# Patient Record
Sex: Male | Born: 1945 | Race: White | Hispanic: No | Marital: Married | Smoking: Never smoker
Health system: Southern US, Community
[De-identification: ages and names within clinical notes are randomized; demographics above are authoritative.]

## PROBLEM LIST (undated history)

## (undated) DIAGNOSIS — G20A1 Parkinson's disease without dyskinesia, without mention of fluctuations: Secondary | ICD-10-CM

## (undated) DIAGNOSIS — G20 Parkinson's disease: Secondary | ICD-10-CM

## (undated) DIAGNOSIS — E119 Type 2 diabetes mellitus without complications: Secondary | ICD-10-CM

## (undated) DIAGNOSIS — N200 Calculus of kidney: Secondary | ICD-10-CM

## (undated) DIAGNOSIS — K219 Gastro-esophageal reflux disease without esophagitis: Secondary | ICD-10-CM

## (undated) DIAGNOSIS — M1990 Unspecified osteoarthritis, unspecified site: Secondary | ICD-10-CM

## (undated) DIAGNOSIS — N400 Enlarged prostate without lower urinary tract symptoms: Secondary | ICD-10-CM

## (undated) DIAGNOSIS — I10 Essential (primary) hypertension: Secondary | ICD-10-CM

## (undated) DIAGNOSIS — E7800 Pure hypercholesterolemia, unspecified: Secondary | ICD-10-CM

## (undated) DIAGNOSIS — N529 Male erectile dysfunction, unspecified: Secondary | ICD-10-CM

## (undated) DIAGNOSIS — R55 Syncope and collapse: Secondary | ICD-10-CM

## (undated) DIAGNOSIS — J309 Allergic rhinitis, unspecified: Secondary | ICD-10-CM

## (undated) DIAGNOSIS — H9110 Presbycusis, unspecified ear: Secondary | ICD-10-CM

## (undated) DIAGNOSIS — B351 Tinea unguium: Secondary | ICD-10-CM

## (undated) DIAGNOSIS — R609 Edema, unspecified: Secondary | ICD-10-CM

## (undated) DIAGNOSIS — R1313 Dysphagia, pharyngeal phase: Secondary | ICD-10-CM

## (undated) HISTORY — PX: TOTAL KNEE ARTHROPLASTY: SHX125

## (undated) HISTORY — PX: JOINT REPLACEMENT: SHX530

## (undated) HISTORY — DX: Type 2 diabetes mellitus without complications: E11.9

## (undated) HISTORY — PX: KNEE SURGERY: SHX244

## (undated) HISTORY — DX: Pure hypercholesterolemia, unspecified: E78.00

## (undated) HISTORY — DX: Essential (primary) hypertension: I10

## (undated) HISTORY — DX: Calculus of kidney: N20.0

## (undated) HISTORY — DX: Parkinson's disease without dyskinesia, without mention of fluctuations: G20.A1

## (undated) HISTORY — DX: Allergic rhinitis, unspecified: J30.9

## (undated) HISTORY — DX: Tinea unguium: B35.1

## (undated) HISTORY — DX: Benign prostatic hyperplasia without lower urinary tract symptoms: N40.0

## (undated) HISTORY — DX: Presbycusis, unspecified ear: H91.10

## (undated) HISTORY — DX: Edema, unspecified: R60.9

## (undated) HISTORY — DX: Parkinson's disease: G20

## (undated) HISTORY — DX: Syncope and collapse: R55

## (undated) HISTORY — PX: HAND SURGERY: SHX662

## (undated) HISTORY — DX: Dysphagia, pharyngeal phase: R13.13

## (undated) HISTORY — DX: Male erectile dysfunction, unspecified: N52.9

---

## 2005-07-25 ENCOUNTER — Emergency Department (HOSPITAL_COMMUNITY): Admission: EM | Admit: 2005-07-25 | Discharge: 2005-07-25 | Payer: Self-pay | Admitting: Emergency Medicine

## 2005-08-03 ENCOUNTER — Ambulatory Visit: Payer: Self-pay | Admitting: Internal Medicine

## 2005-08-14 ENCOUNTER — Ambulatory Visit: Payer: Self-pay | Admitting: Internal Medicine

## 2005-10-10 ENCOUNTER — Inpatient Hospital Stay (HOSPITAL_COMMUNITY): Admission: RE | Admit: 2005-10-10 | Discharge: 2005-10-12 | Payer: Self-pay | Admitting: Orthopedic Surgery

## 2005-11-26 ENCOUNTER — Ambulatory Visit (HOSPITAL_BASED_OUTPATIENT_CLINIC_OR_DEPARTMENT_OTHER): Admission: RE | Admit: 2005-11-26 | Discharge: 2005-11-26 | Payer: Self-pay | Admitting: Orthopedic Surgery

## 2006-10-03 ENCOUNTER — Ambulatory Visit (HOSPITAL_COMMUNITY): Admission: RE | Admit: 2006-10-03 | Discharge: 2006-10-03 | Payer: Self-pay | Admitting: Urology

## 2007-08-30 ENCOUNTER — Emergency Department (HOSPITAL_COMMUNITY): Admission: EM | Admit: 2007-08-30 | Discharge: 2007-08-30 | Payer: Self-pay | Admitting: Emergency Medicine

## 2008-07-26 ENCOUNTER — Encounter: Payer: Self-pay | Admitting: Internal Medicine

## 2009-10-03 ENCOUNTER — Encounter: Admission: RE | Admit: 2009-10-03 | Discharge: 2009-10-03 | Payer: Self-pay | Admitting: Psychiatry

## 2009-10-31 ENCOUNTER — Inpatient Hospital Stay (HOSPITAL_COMMUNITY): Admission: RE | Admit: 2009-10-31 | Discharge: 2009-11-02 | Payer: Self-pay | Admitting: Orthopedic Surgery

## 2010-03-14 ENCOUNTER — Ambulatory Visit: Payer: Self-pay

## 2010-03-14 ENCOUNTER — Encounter: Payer: Self-pay | Admitting: Cardiovascular Disease

## 2010-03-14 DIAGNOSIS — M79609 Pain in unspecified limb: Secondary | ICD-10-CM

## 2010-07-31 ENCOUNTER — Encounter (INDEPENDENT_AMBULATORY_CARE_PROVIDER_SITE_OTHER): Payer: Self-pay | Admitting: *Deleted

## 2010-09-04 ENCOUNTER — Encounter (INDEPENDENT_AMBULATORY_CARE_PROVIDER_SITE_OTHER): Payer: Self-pay | Admitting: *Deleted

## 2010-09-05 ENCOUNTER — Ambulatory Visit: Payer: Self-pay | Admitting: Internal Medicine

## 2010-09-18 ENCOUNTER — Telehealth: Payer: Self-pay | Admitting: Internal Medicine

## 2010-09-19 ENCOUNTER — Ambulatory Visit: Payer: Self-pay | Admitting: Internal Medicine

## 2010-11-14 NOTE — Procedures (Signed)
Summary: Colonoscopy  Patient: [name] Note: All result statuses are Final unless otherwise noted.  Tests: (1) Colonoscopy (COL)   COL Colonoscopy           DONE     [facility]     [address]     [place], [place]  [address]           COLONOSCOPY PROCEDURE REPORT           PATIENT:  Eric, Le  MR#:  [id number]     BIRTHDATE:  14-Oct-1946, 64 yrs. old  GENDER:  male     ENDOSCOPIST:  Iva Boop, MD, [facility]           PROCEDURE DATE:  09/19/2010     PROCEDURE:  Colonoscopy 295[address]     ASA CLASS:  Class II     INDICATIONS:  surveillance and high-risk screening, history of     polyps 5 mm polyp removed but not recovered 2006     MEDICATIONS:   Fentanyl 75 mcg, Versed 8 mg IV           DESCRIPTION OF PROCEDURE:   After the risks benefits and     alternatives of the procedure were thoroughly explained, informed     consent was obtained.  Digital rectal exam was performed and     revealed no rectal masses and an enlarged prostate.  Mildly     enlarged prostate without nodules or asymmetry. The LB 180AL     [id number] endoscope was introduced through the anus and advanced to     the cecum, which was identified by both the appendix and ileocecal     valve, without limitations.  The quality of the prep was     excellent, using MoviPrep.  The instrument was then slowly     withdrawn as the colon was fully examined. Insertion: 4:50 minutes     Withdrawal: 10:09 minutes     <<PROCEDUREIMAGES>>           FINDINGS:  A normal appearing cecum, ileocecal valve, and     appendiceal orifice were identified. The ascending, hepatic     flexure, transverse, splenic flexure, descending, sigmoid colon,     and rectum appeared unremarkable.   Retroflexed views in the     rectum revealed no abnormalities.    The scope was then withdrawn     from the patient and the procedure completed.           COMPLICATIONS:  None     ENDOSCOPIC IMPRESSION:     1) Normal colonoscopy, excellent prep    RECOMMENDATIONS:     Keep follow-up with Dr. Kevan Ny re: mildly enlarged prostate.           REPEAT EXAM:  In 10 year(s) for routine screening colonoscopy.           Iva Boop, MD, [facility]           CC:  The Patient     R. Robley Fries, MD           n.     Rosalie Doctor:   Iva Boop at 09/19/2010 12:57 PM           Onnie Boer, [id number]  Note: An exclamation mark (!) indicates a result that was not dispersed into the flowsheet. Document Creation Date: 09/19/2010 12:57 PM _______________________________________________________________________  (1) Order result status: Final Collection or observation date-time: 09/19/2010 12:48 Requested date-time:  Receipt  date-time:  Reported date-time:  Referring Physician:   Ordering Physician: Stan Head [phone]) Specimen Source:  Source: Launa Grill Order Number: [id number] Lab site:   Appended Document: Colonoscopy    Clinical Lists Changes  Observations: Added new observation of COLONNXTDUE: 09/2020 (09/19/2010 14:13)

## 2010-11-14 NOTE — Letter (Signed)
Summary: Colonoscopy Letter  [facility]  [address] [place], [place] [address]   Phone: [phone]  Fax: [phone]      July 31, 2010 MRN: [id number]   [name] [address] [place], [place]  [address]   Dear [name],   According to your medical record, it is time for you to schedule a Colonoscopy. The American Cancer Society recommends this procedure as a method to detect early colon cancer. Patients with a family history of colon cancer, or a personal history of colon polyps or inflammatory bowel disease are at increased risk.  This letter has been generated based on the recommendations made at the time of your procedure. If you feel that in your particular situation this may no longer apply, please contact our office.  Please call our office at [phone] to schedule this appointment or to update your records at your earliest convenience.  Thank you for cooperating with [place] to provide you with the very best care possible.   Sincerely,   Iva Boop, M.D.  [facility] [phone]

## 2010-11-14 NOTE — Letter (Signed)
Summary: [facility] Instructions  [facility]  [address] [place], [place] [address]   Phone: [phone]  Fax: [phone]       [name]    04-27-1946    MRN: [id number]        Procedure Day /Date:  Tuesday 09/19/2010     Arrival Time:  10:30 am      Procedure Time:  11:30 am     Location of Procedure:                    _ x_  [facility] (4th Floor)                        PREPARATION FOR COLONOSCOPY WITH MOVIPREP   Starting 5 days prior to your procedure Thursday 12/1 do not eat nuts, seeds, popcorn, corn, beans, peas,  salads, or any raw vegetables.  Do not take any fiber supplements (e.g. Metamucil, Citrucel, and Benefiber).  THE DAY BEFORE YOUR PROCEDURE         DATE: Monday 12/5  1.  Drink clear liquids the entire day-NO SOLID FOOD  2.  Do not drink anything colored red or purple.  Avoid juices with pulp.  No orange juice.  3.  Drink at least 64 oz. (8 glasses) of fluid/clear liquids during the day to prevent dehydration and help the prep work efficiently.  CLEAR LIQUIDS INCLUDE: Water Jello Ice Popsicles Tea (sugar ok, no milk/cream) Powdered fruit flavored drinks Coffee (sugar ok, no milk/cream) Gatorade Juice: apple, white grape, white cranberry  Lemonade Clear bullion, consomm, broth Carbonated beverages (any kind) Strained chicken noodle soup Hard Candy                             4.  In the morning, mix first dose of MoviPrep solution:    Empty 1 Pouch A and 1 Pouch B into the disposable container    Add lukewarm drinking water to the top line of the container. Mix to dissolve    Refrigerate (mixed solution should be used within 24 hrs)  5.  Begin drinking the prep at 5:00 p.m. The MoviPrep container is divided by 4 marks.   Every 15 minutes drink the solution down to the next mark (approximately 8 oz) until the full liter is complete.   6.  Follow completed prep with 16 oz of clear liquid of your choice (Nothing  red or purple).  Continue to drink clear liquids until bedtime.  7.  Before going to bed, mix second dose of MoviPrep solution:    Empty 1 Pouch A and 1 Pouch B into the disposable container    Add lukewarm drinking water to the top line of the container. Mix to dissolve    Refrigerate  THE DAY OF YOUR PROCEDURE      DATE: Tuesday 12/6  Beginning at 6:30 a.m. (5 hours before procedure):         1. Every 15 minutes, drink the solution down to the next mark (approx 8 oz) until the full liter is complete.  2. Follow completed prep with 16 oz. of clear liquid of your choice.    3. You may drink clear liquids until 9:30 am (2 HOURS BEFORE PROCEDURE).   MEDICATION INSTRUCTIONS  Unless otherwise instructed, you should take regular prescription medications with a small sip of water   as early as possible the  morning of your procedure.    Additional medication instructions: Do not take Triam/HCTZ day of procedure.         OTHER INSTRUCTIONS  You will need a responsible adult at least 65 years of age to accompany you and drive you home.   This person must remain in the waiting room during your procedure.  Wear loose fitting clothing that is easily removed.  Leave jewelry and other valuables at home.  However, you may wish to bring a book to read or  an iPod/MP3 player to listen to music as you wait for your procedure to start.  Remove all body piercing jewelry and leave at home.  Total time from sign-in until discharge is approximately 2-3 hours.  You should go home directly after your procedure and rest.  You can resume normal activities the  day after your procedure.  The day of your procedure you should not:   Drive   Make legal decisions   Operate machinery   Drink alcohol   Return to work  You will receive specific instructions about eating, activities and medications before you leave.    The above instructions have been reviewed and explained to me by    Ezra Sites RN  September 05, 2010 1:58 PM     I fully understand and can verbalize these instructions _____________________________ Date _________

## 2010-11-14 NOTE — Miscellaneous (Signed)
Summary: Orders Update  Clinical Lists Changes  Problems: Added new problem of LEG PAIN (ICD-729.5) Orders: Added new Test order of Venous Duplex Lower Extremity (Venous Duplex Lower) - Signed 

## 2010-11-14 NOTE — Progress Notes (Signed)
Summary: Questions  Phone Note Call from Patient Call back at Home Phone [phone]   Caller: Patients wife Call For: Dr. Leone Payor Reason for Call: Talk to Nurse Summary of Call: Pts wife is calling because pt has colon tomorrow and had a knee replacement several years ago but not when he has procedures he needs to take amoxicillin does he need to do this for colon? Initial call taken by: [place] Johnson,  September 18, 2010 3:41 PM  Follow-up for Phone Call        Spoke with pt's wife who states pt has to take Amoxicillin prior to procedures due to knee replacements.  States I will double check with Dr. Leone Payor and will call wife back if pt needs to take.   Dr. Leone Payor, I just want to double check if you wanted pt to take ATB.  Thank you  Follow-up by: Karl Bales RN,  September 18, 2010 3:54 PM  Additional Follow-up for Phone Call Additional follow up Details #1::        No antibiotics indicated for joint replacements and colonoscopy - if they have a special circumstance I will consider but risks of antibiotics worse Additional Follow-up by: Iva Boop MD, Clementeen Graham,  September 19, 2010 7:59 AM    Additional Follow-up for Phone Call Additional follow up Details #2::    Writer spoke with wife re: above from Dr. Leone Payor Follow-up by: Karl Bales RN,  September 19, 2010 9:29 AM

## 2010-11-14 NOTE — Miscellaneous (Signed)
Summary: [facility]  Clinical Lists Changes  Medications: Added new medication of MOVIPREP 100 GM  SOLR (PEG-KCL-NACL-NASULF-NA ASC-C) As per prep instructions. - Signed Rx of MOVIPREP 100 GM  SOLR (PEG-KCL-NACL-NASULF-NA ASC-C) As per prep instructions.;  #1 x 0;  Signed;  Entered by: Ezra Sites RN;  Authorized by: Iva Boop MD, FACG;  Method used: Electronically to [facility]*, [address] [place], [place]  [address], Ph: [phone] or [phone], Fax: [phone] Observations: Added new observation of NKA: T (09/05/2010 13:28)    Prescriptions: MOVIPREP 100 GM  SOLR (PEG-KCL-NACL-NASULF-NA ASC-C) As per prep instructions.  #1 x 0   Entered by:   Ezra Sites RN   Authorized by:   Iva Boop MD, [facility]   Signed by:   Ezra Sites RN on 09/05/2010   Method used:   Electronically to        [facility]* (retail)       [address] [place], [place]  [address]       Ph: [phone] or [phone]       Fax: [phone]   RxID:   [id number]

## 2010-12-31 LAB — CBC
HCT: 48 % (ref 39.0–52.0)
HCT: 38 % — ABNORMAL LOW (ref 39.0–52.0)
Hemoglobin: 16.4 g/dL (ref 13.0–17.0)
Hemoglobin: 11.8 g/dL — ABNORMAL LOW (ref 13.0–17.0)
Hemoglobin: 13 g/dL (ref 13.0–17.0)
MCHC: 35 g/dL (ref 30.0–36.0)
MCV: 91.4 fL (ref 78.0–100.0)
Platelets: 245 10*3/uL (ref 150–400)
Platelets: 208 10*3/uL (ref 150–400)
RBC: 3.66 MIL/uL — ABNORMAL LOW (ref 4.22–5.81)
RDW: 12 % (ref 11.5–15.5)
WBC: 4.5 10*3/uL (ref 4.0–10.5)
WBC: 8.8 10*3/uL (ref 4.0–10.5)
WBC: 9.2 10*3/uL (ref 4.0–10.5)

## 2010-12-31 LAB — C-REACTIVE PROTEIN: CRP: 0 mg/dL — ABNORMAL LOW (ref <0.6)

## 2010-12-31 LAB — BASIC METABOLIC PANEL
CO2: 29 mEq/L (ref 19–32)
Calcium: 8.2 mg/dL — ABNORMAL LOW (ref 8.4–10.5)
Calcium: 8.4 mg/dL (ref 8.4–10.5)
Chloride: 101 mEq/L (ref 96–112)
Creatinine, Ser: 0.84 mg/dL (ref 0.4–1.5)
GFR calc Af Amer: >60 mL/min (ref >60)
GFR calc non Af Amer: >60 mL/min (ref >60)
Potassium: 4 mEq/L (ref 3.5–5.1)
Sodium: 135 mEq/L (ref 135–145)
Sodium: 137 mEq/L (ref 135–145)

## 2010-12-31 LAB — COMPREHENSIVE METABOLIC PANEL
Albumin: 4.1 g/dL (ref 3.5–5.2)
BUN: 15 mg/dL (ref 6–23)
Chloride: 102 mEq/L (ref 96–112)
Creatinine, Ser: 0.82 mg/dL (ref 0.4–1.5)
GFR calc non Af Amer: >60 mL/min (ref >60)
Glucose, Bld: 99 mg/dL (ref 70–99)
Total Bilirubin: 0.8 mg/dL (ref 0.3–1.2)

## 2010-12-31 LAB — DIFFERENTIAL
Basophils Absolute: 0 10*3/uL (ref 0.0–0.1)
Basophils Relative: 0 % (ref 0–1)
Eosinophils Absolute: 0.1 10*3/uL (ref 0.0–0.7)
Monocytes Relative: 7 % (ref 3–12)
Neutro Abs: 3 10*3/uL (ref 1.7–7.7)
Neutrophils Relative %: 66 % (ref 43–77)

## 2010-12-31 LAB — PROTIME-INR
INR: 1.2 (ref 0.00–1.49)
INR: 2.42 — ABNORMAL HIGH (ref 0.00–1.49)
Prothrombin Time: 26.1 seconds — ABNORMAL HIGH (ref 11.6–15.2)

## 2010-12-31 LAB — URINALYSIS, ROUTINE W REFLEX MICROSCOPIC
Bilirubin Urine: NEGATIVE
Glucose, UA: NEGATIVE mg/dL
Hgb urine dipstick: NEGATIVE
Specific Gravity, Urine: 1.019 (ref 1.005–1.030)
pH: 7 (ref 5.0–8.0)

## 2010-12-31 LAB — TYPE AND SCREEN
ABO/RH(D): O NEG
Antibody Screen: NEGATIVE

## 2010-12-31 LAB — HEMOGLOBIN A1C
Hgb A1c MFr Bld: 5.5 % (ref 4.6–6.1)
Mean Plasma Glucose: 111 mg/dL

## 2010-12-31 LAB — SEDIMENTATION RATE: Sed Rate: 2 mm/hr (ref 0–16)

## 2011-03-02 NOTE — Op Note (Signed)
[name], [name]                   ACCOUNT NO.:  [id number]   MEDICAL RECORD NO.:  [id number]           PATIENT TYPE:   LOCATION:                                 FACILITY:   PHYSICIAN:  Robert A. Thurston Hole, M.D.      DATE OF BIRTH:   DATE OF PROCEDURE:  10/10/2005  DATE OF DISCHARGE:                                 OPERATIVE REPORT   PREOPERATIVE DIAGNOSIS:  Left knee degenerative joint disease.   POSTOPERATIVE DIAGNOSIS:  Left knee degenerative joint disease.   PROCEDURE:  1.  Left total knee replacement using DePuy cemented total knee system with      #5 cemented femur, #5 cemented tibia with 12.5-mm polyethylene RP tibial      spacer, and 38-mm polyethylene cemented patella.  2.  Left total knee computer assisted navigation.   SURGEON:  Elana Alm. Thurston Hole, M.D.   ASSISTANT:  Julien Girt, P.A.   ANESTHESIA:  General.   OPERATIVE TIME:  1 hour 40 minutes.   COMPLICATIONS:  None.   DESCRIPTION OF PROCEDURE:  [name] was brought to the operating room on  10/10/2005 after a femoral nerve block had been placed in the holding room  by anesthesia.  He was placed on the operating table in the supine position.  He received Ancef 1 gram IV preoperatively for prophylaxis. After being  placed under general anesthesia his left knee was examined. Range of motion  from minus 5 to 130 degrees with moderate varus deformity, knee stable  ligamentous exam with normal patellar tracking. He had a Foley catheter  placed under sterile conditions. His left leg was then prepped using sterile  DuraPrep and draped using sterile technique. The leg was exsanguinated and a  thigh tourniquet elevated to 365 mm. Initially through a 15 cm longitudinal  incision based over the patella, initial exposure was made. The underlying  subcutaneous tissues were incised in line with the skin incision.   A median arthrotomy was performed revealing an excessive amount of normal-  appearing joint fluid. The  articular surfaces were inspected. He had grade 4  changes medially, grade 3 changes laterally, and grade 3 and 4 changes in  his patellofemoral joint. Osteophytes were removed from the femoral condyles  and tibial plateau. The medial and lateral meniscal remnants were removed as  well as the anterior cruciate ligament remnant. After this was done, then  two pins were placed in the tibial metaphysis and in the distal femoral  metaphysis for placement and activation of the computer navigation system.  The system was activated and initial deformities were noted with a flexion  contracture of 5 degrees and seven degrees of varus deformity. At this point  then, the distal femoral cut was made using the computer navigation system  resecting 12 mm off the distal femur. The distal femur was sized, #5 was  found to be of the appropriate size; and then a #5 cutting jig was placed  and then these cuts were made. These cuts were all verified with the  computer, found to be perfect and  exact. After this was done, then the  proximal tibial jig was used with the computer navigation system; and the  proximal tibial cut was made resecting 8-mm of proximal tibia. Again, these  cuts were verified and found to be exact as well. Spacer blocks were then  used; a 12.5 spacer block in flexion and extension found to give excellent  stability and excellent correction of his flexion and varus deformities to  neutral.   At this point, the #5 tibial base plate was placed on the cut surface the  tibia in the keel cut was made. The PCL box cutter was placed on the distal  femur and then these cuts were made. At this point, the #5 femoral trial was  placed; the #5 tibial base plate trial was placed; and with a 12.5-mm  polyethylene tibial spacer, the knee was reduced, taken through range of  motion, verified with the computer from 0-130 degrees with neutral alignment  with excellent correction of his flexion and varus  deformities.   At this point, the navigation system was deactivated and the pins were  removed. The patella was then sized. A resurfacing 10 mm cut was made and  three locking holes placed for a 38-mm patella. The trial was placed.  Patellofemoral tracking was then noted to be normal. At this point, it was  felt that all the trial components were of excellent size, fit, and  stability. They were then removed and the knee was then jet lavage irrigated  with 3 liters of saline solution. The proximal tibia was then exposed and  the #5 tibial baseplate with cement backing was hammered into position with  an excellent fit with excess cement being removed from around the edges. A  #5 femoral component with cement backing was hammered into position, also  with an excellent fit with excess cement being removed around the edges. The  12.5-mm polyethylene RP tibial spacer was placed on the tibial baseplate.  The knee was reduced, taken through range of motion, from 0-130 degrees with  excellent stability, and excellent correction of his flexion and varus  deformities. The 38-mm, polyethylene cement backed patella was then placed  in its position and held there with a clamp. After the cement hardened,  patellofemoral tracking was evaluated; and this was found be normal. At this  point, it was felt that all the components were of excellent size, fit, and  stability. The knee was further irrigated with saline.   The tourniquet was then released. Hemostasis was obtained with cautery. The  arthrotomy was then closed with #1 Ethibond suture over two medium Hemovac  drains. Subcutaneous tissues were closed with #0 and 2-0 Vicryl.  Subcuticular layer closed with 3-0 Monocryl. Steri-Strips were applied.  Sterile dressings were applied and a long-leg splint. Hemovac injected with  0.25% Marcaine with epinephrine and 4 mg and clamp. The patient then awakened, extubated, and taken to recovery in stable  condition. Needle and  sponge counts correct x2 at the end of the case.      Robert A. Thurston Hole, M.D.  Electronically Signed     RAW/MEDQ  D:  10/10/2005  T:  10/10/2005  Job:  [id number]

## 2011-03-02 NOTE — Op Note (Signed)
NAME:  [name], [name]                   ACCOUNT NO.:  [id number]   MEDICAL RECORD NO.:  [id number]          PATIENT TYPE:  AMB   LOCATION:  DSC                          FACILITY:  [facility]   PHYSICIAN:  Robert A. Thurston Hole, M.D. DATE OF BIRTH:  June 20, 1946   DATE OF PROCEDURE:  11/26/2005  DATE OF DISCHARGE:                                 OPERATIVE REPORT   PREOPERATIVE DIAGNOSIS:  Left total knee arthrofibrosis.   POSTOPERATIVE DIAGNOSIS:  Left total knee arthrofibrosis.   OPERATION PERFORMED:  1.  Left total knee examination under anesthesia followed by manipulation.  2.  Left total knee cortisone injection.   SURGEON:  Elana Alm. Thurston Hole, M.D.   ANESTHESIA:  General.   OPERATIVE TIME:  10 minutes.   COMPLICATIONS:  None.   INDICATIONS FOR PROCEDURE:  [name] is a 65 year old gentleman.  He  underwent a left total knee replacement approximately seven weeks ago.  He  has had significant problems with regaining flexion despite aggressive  physical therapy with early arthrofibrosis and then because of this is now  to undergo manipulation.   DESCRIPTION OF PROCEDURE:  [name] was brought to the operating room on  November 26, 2005, placed on the operating table in supine position.  After  an adequate level of general anesthesia was obtained, his left knee was  examined.  The initial range of motion was from 0 to 105 degrees. Gentle  manipulation was carried out in flexion breaking up soft adhesions and  improving flexion to 135 degrees with full extension.  Knee remained stable  with normal patellar tracking.  After this was done, then under sterile  conditions through a combination of 80 mg of Depo-Medrol and 10 mL of 0.25%  Marcaine with epinephrine was injected into the knee under sterile  conditions.  The patient tolerated the procedure well.  He received Ancef 1  g IV intraoperatively for prophylaxis.  At this point he was then awakened  and taken to recovery room in stable  condition.   FOLLOW-UP CARE:  [name] will be followed as an outpatient on Percocet for  pain with early aggressive physical therapy.  See him back in the office in  a week for recheck and follow-up.      Robert A. Thurston Hole, M.D.  Electronically Signed    RAW/MEDQ  D:  11/26/2005  T:  11/26/2005  Job:  [id number]

## 2011-03-02 NOTE — Discharge Summary (Signed)
NAME:  [name], [name]                   ACCOUNT NO.:  [id number]   MEDICAL RECORD NO.:  [id number]          PATIENT TYPE:  INP   LOCATION:  5002                         FACILITY:  [facility]   PHYSICIAN:  Robert A. Thurston Hole, M.D. DATE OF BIRTH:  [birth year]   DATE OF ADMISSION:  10/10/2005  DATE OF DISCHARGE:  10/12/2005                                 DISCHARGE SUMMARY   ADMISSION DIAGNOSES:  1.  End-stage degenerative joint disease, left knee.  2.  History of renal calculi.  3.  High cholesterol.   DISCHARGE DIAGNOSES:  1.  End-stage degenerative joint disease, left knee.  2.  History of renal calculi.  3.  High cholesterol.  4.  Anticoagulant therapy.   HISTORY OF PRESENT ILLNESS:  The patient is a 65 year old white male with a  history of bilateral knee end-stage degenerative joint disease.  His left is  more painful than is right.  He has tried conservative care, including  multiple arthroscopic debridements, anti-inflammatories, intra-articular  cortisone injections, and hyaluronic acid injections, all without success.  He understands the risks, benefits, and possible complications of a left  total knee replacement and his without question.   PROCEDURE:  On October 10, 2005, the patient underwent a left total knee  replacement by Dr. Thurston Hole and a left femoral nerve block by anesthesia.  He  tolerated both procedures well and was admitted postoperatively for pain  control, DVT prophylaxis, and physical therapy.   On postoperative day #1, the patient's vital signs were stable.  He was  afebrile.  His hemoglobin was 12.9.  His surgical wound was well-  approximated.  Physical therapy was started.  He ambulated 80 feet.  He was  able to stand and brush his teeth, although he did have a bit of a  hypotensive episode with this.  The patient was instructed to call when  getting up.   On postoperative day #2, the patient continued to do great.  He had no  complaints.  T max 100.6.   Temperature on exam was 98.1.  His hemoglobin was  11.8.  His INR was 1.7.  He was metabolically stable.  His surgical wound  was well-approximated.  He was neurovascularly intact and had no complaints.  He was discharged to home in stable condition, weightbearing as tolerated,  on a regular diet, with instructions to change his dressing daily, call with  increased redness, increased swelling, increased drainage, or a temperature  greater than 101.   DISCHARGE MEDICATIONS:  1.  Percocet 5/325 one to two q.4-6h. p.r.n. pain.  2.  Methocarbamol 500 mg one tablet every four to six hours as needed for      muscle spasm.  3.  Coumadin 5 mg daily.  4.  Lipitor 20 mg daily.  5.  He was instructed not to take his aspirin while on Coumadin.  6.  Colace 100 mg one tablet twice a day.  7.  Senokot-S two tablets before dinner.   FOLLOW UP:  His followup appointment is October 23, 2005.  Genevieve Norlander is his  home health who  will be providing the CPM that he will be using eight hours  a day as well as elevating his left heel on a folded pillow for 30 minutes  twice a day.      Kirstin Shepperson, P.A.      Robert A. Thurston Hole, M.D.  Electronically Signed    KS/MEDQ  D:  12/22/2005  T:  12/24/2005  Job:  [id number]

## 2011-06-01 ENCOUNTER — Other Ambulatory Visit: Payer: Self-pay | Admitting: Internal Medicine

## 2011-06-05 ENCOUNTER — Ambulatory Visit
Admission: RE | Admit: 2011-06-05 | Discharge: 2011-06-05 | Disposition: A | Discharge status: Home / Self Care | Payer: BC Managed Care – PPO | Source: Ambulatory Visit | Attending: Internal Medicine | Admitting: Internal Medicine

## 2011-07-24 LAB — BASIC METABOLIC PANEL
Calcium: 9.3
Creatinine, Ser: 1.23
GFR calc non Af Amer: 60 — ABNORMAL LOW
Glucose, Bld: 197 — ABNORMAL HIGH
Sodium: 138

## 2011-07-24 LAB — URINALYSIS, ROUTINE W REFLEX MICROSCOPIC
Bilirubin Urine: NEGATIVE
Nitrite: NEGATIVE
Protein, ur: NEGATIVE
Specific Gravity, Urine: 1.028
Urobilinogen, UA: 0.2

## 2011-07-24 LAB — URINE MICROSCOPIC-ADD ON

## 2013-01-26 ENCOUNTER — Other Ambulatory Visit (HOSPITAL_COMMUNITY): Payer: Self-pay | Admitting: Urology

## 2013-01-26 DIAGNOSIS — R937 Abnormal findings on diagnostic imaging of other parts of musculoskeletal system: Secondary | ICD-10-CM

## 2013-01-28 ENCOUNTER — Encounter (INDEPENDENT_AMBULATORY_CARE_PROVIDER_SITE_OTHER): Payer: Self-pay | Admitting: General Surgery

## 2013-01-29 ENCOUNTER — Ambulatory Visit (HOSPITAL_COMMUNITY)
Admission: RE | Admit: 2013-01-29 | Discharge: 2013-01-29 | Disposition: A | Discharge status: Home / Self Care | Payer: Medicare Other | Source: Ambulatory Visit | Attending: Urology | Admitting: Urology

## 2013-01-29 DIAGNOSIS — M5380 Other specified dorsopathies, site unspecified: Secondary | ICD-10-CM | POA: Insufficient documentation

## 2013-01-29 DIAGNOSIS — M5137 Other intervertebral disc degeneration, lumbosacral region: Secondary | ICD-10-CM | POA: Insufficient documentation

## 2013-01-29 DIAGNOSIS — R9389 Abnormal findings on diagnostic imaging of other specified body structures: Secondary | ICD-10-CM | POA: Insufficient documentation

## 2013-01-29 DIAGNOSIS — M899 Disorder of bone, unspecified: Secondary | ICD-10-CM | POA: Insufficient documentation

## 2013-01-29 DIAGNOSIS — M51379 Other intervertebral disc degeneration, lumbosacral region without mention of lumbar back pain or lower extremity pain: Secondary | ICD-10-CM | POA: Insufficient documentation

## 2013-01-29 DIAGNOSIS — M545 Low back pain, unspecified: Secondary | ICD-10-CM | POA: Insufficient documentation

## 2013-01-29 DIAGNOSIS — R937 Abnormal findings on diagnostic imaging of other parts of musculoskeletal system: Secondary | ICD-10-CM

## 2013-01-29 LAB — CREATININE, SERUM
Creatinine, Ser: 0.97 mg/dL (ref 0.50–1.35)
GFR calc Af Amer: >90 mL/min (ref >90)
GFR calc non Af Amer: 84 mL/min — ABNORMAL LOW (ref >90)

## 2013-01-29 MED ORDER — GADOBENATE DIMEGLUMINE 529 MG/ML IV SOLN
18.0000 mL | Freq: Once | INTRAVENOUS | Status: AC | PRN
  Administered 2013-01-29: 18 mL via INTRAVENOUS

## 2013-02-03 ENCOUNTER — Ambulatory Visit (INDEPENDENT_AMBULATORY_CARE_PROVIDER_SITE_OTHER): Payer: Medicare Other | Admitting: Surgery

## 2013-02-05 ENCOUNTER — Encounter (HOSPITAL_COMMUNITY): Payer: Self-pay | Admitting: *Deleted

## 2013-02-05 ENCOUNTER — Other Ambulatory Visit: Payer: Self-pay | Admitting: Urology

## 2013-02-05 NOTE — Progress Notes (Addendum)
Pt states no cardiac disease, but then stated he sees dr Anne Fu yearly. Dr Anne Fu is a cardiologist.  Aline Brochure is trying to obtain recent ekg from (he states) md visit in last 12 months.  Denies any sx in last 12 months.

## 2013-02-06 ENCOUNTER — Encounter (HOSPITAL_COMMUNITY): Payer: Self-pay | Admitting: Pharmacy Technician

## 2013-02-08 NOTE — H&P (Signed)
Reason For Visit         [name] returns for a follow-up visit. The patient had been taking low-dose daily Cialis which worked well for his voiding. This also helped with erectile dysfunction although a second tablet will often augment that response. Insurance would not cover that so he is back to generic alpha-blocker.  In June of 2013 the patient had ultrasound residual of approximately 40 cc. He has had no clinical signs and symptoms of recurrent clinical stone disease. Recent PSA 09/2011 was 4.96.     [name] returns for a followup visit. Recently, he has had a somewhat complicated history. I saw him back in February of this year for routine followup. At that time, things were pretty stable. He was taking low-dose Cialis, which has really helped his voiding. The patient had a PSA in December of 2012, which was 4.96. The patient's PSA in February 2014 was 2.84, which is as low as it had been in the last 6-7 years. The patient subsequently presented to see Diane in our office of several weeks ago. At that time, the patient was experiencing flank pain and hematuria. The patient has a known history of nephrolithiasis having ESWL in the past and also spontaneous passage of several stones. The patient underwent CT imaging, which revealed a 5 x 9 nonobstructing stone in the proximal left ureter. There were some additional small stones in the left renal collecting system. Nothing obvious on the right.  More concerning was the fact that the patient did have an enlarging sclerotic lesion in the L3 vertebral body. The radiologist read this as quite concerning for osseous metastases. Again, the patient's PSA was normal despite having an enlarged prostate. The patient subsequently was set up for MRI. This was done with contrast. This revealed the lesion to be benign and a probable bone island and the radiologist did not recommend any additional evaluation or assessment for this. In the interim, Eric. Helseth has felt  pretty good but has had some mild twinges of discomfort.   KUB imaging today shows a faint stone still in the proximal left ureter. It measures approximately 5 x 7 mm today.      History of Present Illness              67 YO male with the following past urologic history:  Has a significant history of nephrolithiasis as well as some bladder neck obstruction and voiding issues.    The patient has had some 24-hour urine studies that showed substantially elevated urinary calcium levels, as well as oxalate levels.  We had the patient on Maxzide but he is no longer taking it.  We also instructed him to reduce his oxalate intake.    PSA levels have been normal.  He has had some pretty long-standing voiding issues which have responded pretty well to alpha-blocker therapy (Doxasozin 2 mg 1 po daily), but he did have some ejaculatory dysfunction. Has a history of Parkinson's disease. Patient did develop urinary retention after taking Sudafed.    Past Medical History Problems  1. History of  Hypercholesterolemia 272.0 2. History of  Nephrolithiasis V13.01 3. History of  Parkinson's Disease 332.0  Surgical History Problems  1. History of  Hand Surgery 2. History of  Knee Replacement 3. History of  Knee Surgery  Current Meds 1. Amantadine HCl 100 MG Oral Capsule; Therapy: (Recorded:03May2013) to 2. Aspirin 81 MG Oral Tablet; Therapy: (Recorded:03May2013) to 3. Doxazosin Mesylate 2 MG Oral Tablet; Therapy: (  Recorded:03May2013) to 4. Fish Oil CAPS; Therapy: (Recorded:03May2013) to 5. Hydrocodone-Acetaminophen 7.5-325 MG Oral Tablet; TAKE 1 TO 2 TABLETS EVERY 4 TO 6  HOURS AS NEEDED FOR PAIN; Therapy: 09Apr2014 to (Evaluate:13Apr2014); Last  Rx:09Apr2014 6. Metoprolol Tartrate 25 MG Oral Tablet; Therapy: (Recorded:03May2013) to 7. Multi-Vitamin TABS; Therapy: (Recorded:03May2013) to 8. Ondansetron 8 MG Oral Tablet Dispersible; TAKE 1 TABLET Every 6 hours PRN; Therapy:  09Apr2014 to (Last  Rx:09Apr2014)  Requested for: 09Apr2014 9. Saw Palmetto CAPS; Therapy: (Recorded:03May2013) to 10. Simvastatin 40 MG Oral Tablet; Therapy: (Recorded:03May2013) to 11. Tamsulosin HCl 0.4 MG Oral Capsule; TAKE 1 CAPSULE Daily; Therapy: 04Feb2014 to   (Evaluate:02Sep2014)  Requested for: 04Feb2014; Last Rx:04Feb2014 12. TraMADol HCl 50 MG Oral Tablet; 1 po q4-6 hours prn for pain; Therapy: 11Apr2014 to (Last   Rx:11Apr2014)  Requested for: 14Apr2014 13. Triamterene-HCTZ 37.5-25 MG Oral Capsule; Therapy: (Recorded:03May2013) to  Allergies Medication  1. Sudafed TABS  Family History Problems  1. Maternal history of  Atrial Fibrillation 2. Maternal history of  Breast Cancer V16.3 3. Paternal history of  Emphysema 4. Family history of  Family Health Status - Mother's Age 62yrs 5. Family history of  Family Health Status Number Of Children 3 sons and 1 daughter 39. Family history of  Father Deceased At Age ____ 48yrs, heart and complications COPD 7. Paternal history of  Heart Disease V17.49  Social History Problems  1. Caffeine Use 1 per day 2. Marital History - Currently Married 3. 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 History of  Alcohol Use  Review of Systems Genitourinary, constitutional, skin, eye, otolaryngeal, hematologic/lymphatic, cardiovascular, pulmonary, endocrine, musculoskeletal, gastrointestinal, neurological and psychiatric system(s) were reviewed and pertinent findings if present are noted.  Genitourinary: weak urinary stream and hematuria, but no incomplete emptying of bladder.  Gastrointestinal: flank pain.  Respiratory: cough.    Vitals Vital Signs [Data Includes: Last 1 Day]  22Apr2014 11:08AM  Blood Pressure: 116 / 74 Temperature: 98.5 F Heart Rate: 62  Physical Exam Constitutional: Well nourished and well developed . No acute distress.  ENT:. The ears and nose are normal in appearance.  Pulmonary: No respiratory distress and normal  respiratory rhythm and effort.  Cardiovascular: Heart rate and rhythm are normal . No peripheral edema.  Abdomen: The abdomen is soft and nontender. No masses are palpated. No CVA tenderness. No hernias are palpable. No hepatosplenomegaly noted.  Skin: Normal skin turgor, no visible rash and no visible skin lesions.  Neuro/Psych:. Mood and affect are appropriate.    Results/Data Urine [Data Includes: Last 1 Day]   22Apr2014  COLOR YELLOW   APPEARANCE CLEAR   SPECIFIC GRAVITY 1.025   pH 6.0   GLUCOSE NEG mg/dL  BILIRUBIN NEG   KETONE NEG mg/dL  BLOOD SMALL   PROTEIN NEG mg/dL  UROBILINOGEN 0.2 mg/dL  NITRITE NEG   LEUKOCYTE ESTERASE TRACE   SQUAMOUS EPITHELIAL/HPF NONE SEEN   WBC 0-2 WBC/hpf  RBC 3-6 RBC/hpf  BACTERIA NONE SEEN   CRYSTALS NONE SEEN   CASTS NONE SEEN    Assessment Assessed  1. Benign Prostatic Hypertrophy With Urinary Obstruction 600.01 2. 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 Nephrolithiasis Of The Left Kidney 592.0 4. Ureteral Stone Left 592.1  Plan Health Maintenance (V70.0)  1. UA With REFLEX  Done: 22Apr2014 10:39AM Ureteral Stone (592.1)  2. Follow-up Schedule Surgery Office  Follow-up  Requested for: 22Apr2014  Discussion/Summary  Multiple issues were discussed today. First, he has a moderate sized stone in his left proximal ureter. Based on history, it appears he has had some symptoms for at least 6 weeks and certainly no evidence of any progression the last 2 weeks. At this point, I have suggested that we go ahead and get him on the schedule on a nonurgent basis for ESWL. The stone is fairly easy to visualize but does not appear to be markedly dense and I think there would be an excellent chance the lithotripsy would be successful. The patient's sclerotic lesion at L3 appears to be a non-issue based on definitive MRI analysis. PSA has come down to normal and despite some prostatic enlargement, he has nothing  else of concern for prostate cancer. His current voiding symptoms and BPH are pretty well managed by low-dose Cialis, although cost continues to be an issue. He otherwise is doing fairly well clinically.

## 2013-02-09 ENCOUNTER — Encounter (HOSPITAL_COMMUNITY): Payer: Self-pay | Admitting: *Deleted

## 2013-02-09 ENCOUNTER — Ambulatory Visit (HOSPITAL_COMMUNITY)
Admission: RE | Admit: 2013-02-09 | Discharge: 2013-02-09 | Disposition: A | Discharge status: Home / Self Care | Payer: Medicare Other | Source: Ambulatory Visit | Attending: Urology | Admitting: Urology

## 2013-02-09 ENCOUNTER — Encounter (HOSPITAL_COMMUNITY)
Admission: RE | Disposition: A | Discharge status: Home / Self Care | Payer: Self-pay | Source: Ambulatory Visit | Attending: Urology

## 2013-02-09 ENCOUNTER — Ambulatory Visit (HOSPITAL_COMMUNITY): Payer: Medicare Other

## 2013-02-09 DIAGNOSIS — G20 Parkinson's disease: Secondary | ICD-10-CM | POA: Insufficient documentation

## 2013-02-09 DIAGNOSIS — Z7982 Long term (current) use of aspirin: Secondary | ICD-10-CM | POA: Insufficient documentation

## 2013-02-09 DIAGNOSIS — N139 Obstructive and reflux uropathy, unspecified: Secondary | ICD-10-CM | POA: Insufficient documentation

## 2013-02-09 DIAGNOSIS — N138 Other obstructive and reflux uropathy: Secondary | ICD-10-CM | POA: Insufficient documentation

## 2013-02-09 DIAGNOSIS — N201 Calculus of ureter: Secondary | ICD-10-CM

## 2013-02-09 DIAGNOSIS — G20A1 Parkinson's disease without dyskinesia, without mention of fluctuations: Secondary | ICD-10-CM | POA: Insufficient documentation

## 2013-02-09 DIAGNOSIS — N529 Male erectile dysfunction, unspecified: Secondary | ICD-10-CM | POA: Insufficient documentation

## 2013-02-09 DIAGNOSIS — E7800 Pure hypercholesterolemia, unspecified: Secondary | ICD-10-CM | POA: Insufficient documentation

## 2013-02-09 DIAGNOSIS — N401 Enlarged prostate with lower urinary tract symptoms: Secondary | ICD-10-CM | POA: Insufficient documentation

## 2013-02-09 DIAGNOSIS — Z79899 Other long term (current) drug therapy: Secondary | ICD-10-CM | POA: Insufficient documentation

## 2013-02-09 LAB — BASIC METABOLIC PANEL
Calcium: 9.4 mg/dL (ref 8.4–10.5)
Chloride: 103 mEq/L (ref 96–112)
Creatinine, Ser: 0.97 mg/dL (ref 0.50–1.35)
Potassium: 3.9 mEq/L (ref 3.5–5.1)
Sodium: 137 mEq/L (ref 135–145)

## 2013-02-09 SURGERY — LITHOTRIPSY, ESWL
Anesthesia: LOCAL | Laterality: Left

## 2013-02-09 MED ORDER — DEXTROSE-NACL 5-0.45 % IV SOLN
INTRAVENOUS | Status: DC
  Administered 2013-02-09: 09:00:00 via INTRAVENOUS

## 2013-02-09 MED ORDER — CIPROFLOXACIN HCL 500 MG PO TABS
500.0000 mg | ORAL_TABLET | ORAL | Status: AC
  Administered 2013-02-09: 500 mg via ORAL
  Filled 2013-02-09: qty 1

## 2013-02-09 MED ORDER — DIPHENHYDRAMINE HCL 25 MG PO CAPS
25.0000 mg | ORAL_CAPSULE | ORAL | Status: AC
  Administered 2013-02-09: 25 mg via ORAL
  Filled 2013-02-09: qty 1

## 2013-02-09 MED ORDER — DIAZEPAM 5 MG PO TABS
10.0000 mg | ORAL_TABLET | ORAL | Status: AC
  Administered 2013-02-09: 10 mg via ORAL
  Filled 2013-02-09: qty 2

## 2013-02-09 NOTE — Op Note (Signed)
See [facility] OP note scanned into chart. 

## 2013-02-09 NOTE — Interval H&P Note (Signed)
History and Physical Interval Note:  02/09/2013 10:54 AM  [name]  has presented today for surgery, with the diagnosis of Left Ureteral Calculus  The various methods of treatment have been discussed with the patient and family. After consideration of risks, benefits and other options for treatment, the patient has consented to  Procedure(s): EXTRACORPOREAL SHOCK WAVE LITHOTRIPSY (ESWL) (Left) as a surgical intervention .  The patient's history has been reviewed, patient examined, no change in status, stable for surgery.  I have reviewed the patient's chart and labs.  Questions were answered to the patient's satisfaction.     Alyx Gee S

## 2013-07-10 ENCOUNTER — Encounter: Payer: Self-pay | Admitting: Cardiology

## 2013-07-10 ENCOUNTER — Encounter: Payer: Self-pay | Admitting: *Deleted

## 2013-07-10 DIAGNOSIS — R55 Syncope and collapse: Secondary | ICD-10-CM | POA: Insufficient documentation

## 2013-07-10 DIAGNOSIS — E785 Hyperlipidemia, unspecified: Secondary | ICD-10-CM | POA: Insufficient documentation

## 2013-07-10 DIAGNOSIS — N529 Male erectile dysfunction, unspecified: Secondary | ICD-10-CM | POA: Insufficient documentation

## 2013-07-10 DIAGNOSIS — B351 Tinea unguium: Secondary | ICD-10-CM | POA: Insufficient documentation

## 2013-07-10 DIAGNOSIS — G20 Parkinson's disease: Secondary | ICD-10-CM | POA: Insufficient documentation

## 2013-07-10 DIAGNOSIS — H9110 Presbycusis, unspecified ear: Secondary | ICD-10-CM | POA: Insufficient documentation

## 2013-07-10 DIAGNOSIS — I10 Essential (primary) hypertension: Secondary | ICD-10-CM | POA: Insufficient documentation

## 2013-07-10 DIAGNOSIS — R1313 Dysphagia, pharyngeal phase: Secondary | ICD-10-CM | POA: Insufficient documentation

## 2013-07-10 DIAGNOSIS — N200 Calculus of kidney: Secondary | ICD-10-CM | POA: Insufficient documentation

## 2013-07-10 DIAGNOSIS — N400 Enlarged prostate without lower urinary tract symptoms: Secondary | ICD-10-CM | POA: Insufficient documentation

## 2013-07-10 DIAGNOSIS — E119 Type 2 diabetes mellitus without complications: Secondary | ICD-10-CM | POA: Insufficient documentation

## 2013-07-10 DIAGNOSIS — E7800 Pure hypercholesterolemia, unspecified: Secondary | ICD-10-CM | POA: Insufficient documentation

## 2013-07-10 DIAGNOSIS — J309 Allergic rhinitis, unspecified: Secondary | ICD-10-CM | POA: Insufficient documentation

## 2013-07-10 DIAGNOSIS — G20A1 Parkinson's disease without dyskinesia, without mention of fluctuations: Secondary | ICD-10-CM | POA: Insufficient documentation

## 2013-07-10 DIAGNOSIS — R609 Edema, unspecified: Secondary | ICD-10-CM | POA: Insufficient documentation

## 2013-07-17 ENCOUNTER — Ambulatory Visit: Payer: Medicare Other | Admitting: Cardiology

## 2013-08-14 ENCOUNTER — Encounter: Payer: Self-pay | Admitting: Cardiology

## 2013-08-14 ENCOUNTER — Ambulatory Visit (INDEPENDENT_AMBULATORY_CARE_PROVIDER_SITE_OTHER): Payer: Medicare Other | Admitting: Cardiology

## 2013-08-14 VITALS — BP 118/74 | HR 55 | Ht 72.0 in | Wt 195.0 lb

## 2013-08-14 DIAGNOSIS — R55 Syncope and collapse: Secondary | ICD-10-CM

## 2013-08-14 DIAGNOSIS — I10 Essential (primary) hypertension: Secondary | ICD-10-CM

## 2013-08-14 NOTE — Progress Notes (Signed)
[address] [place], [place]  [address] Phone: [phone] Fax:  [phone]  Date:  08/14/2013   ID:  [name], DOB [birth year], MRN [id number]  PCP:  Pearla Dubonnet, MD   History of Present Illness: [name] is a 67 y.o. male who underwent a nuclear stress test for her prior preoperative evaluation during a knee operation, right total knee, which demonstrated a very mild small in size inferior wall reversible defect mostly in the distal region. Overall low risk test. 58%. He did not have any ST segment changes. Blood pressure response was normal. He exercised for 6 minutes and 37 seconds. No anginal symptoms. He is continuing to promote primary prevention. Walking uphill [facility].   Diet-controlled diabetes as well as early Parkinson's. Fatigue may be part of Parkinson's disease.  On simvastatin for hyperlipidemia, LDL at prior check just above 100. Better control with Crestor but he switched because of cost.  Doing well, enjoyed flyfishing in [place].   Wt Readings from Last 3 Encounters:  08/14/13 195 lb (88.451 kg)  02/09/13 188 lb (85.276 kg)  02/09/13 188 lb (85.276 kg)     Past Medical History  Diagnosis Date  . Hypercholesteremia   . Nephrolithiasis   . Parkinson's disease      followed by [facility] Raquel Sarna at [facility] and Lesia Sago, M.D. in [place]  . Hypertension   . Syncope   . Renal calculus   . Diabetes   . Allergic rhinitis   . BPH (benign prostatic hyperplasia)   . Erectile dysfunction     Staxyn 10 mg or Viagra worked well. 3 samples of Cialis 20 mg provided  . Dysphagia, pharyngeal phase     GERD diagnosed on barium swallow. Has small hiatal hernia. Symptomatically somewhat better on omeprazole but not entirely. We'll try b.i.d. therapy  . Edema     1+ in both ankles, likely multifactorial including medication such as Requip  . Presbycusis     and tinnitus - Dr. Serena Colonel - August/2013  . Onychomycosis of toenail    April 27, 2013 - Dr. Merwyn Katos - podiatry, was in [place] - treating with oral Lamisil and topical nail therapy    Past Surgical History  Procedure Laterality Date  . Hand surgery    . Knee surgery    . Total knee arthroplasty      Current Outpatient Prescriptions  Medication Sig Dispense Refill  . amantadine (SYMMETREL) 100 MG capsule Take 100 mg by mouth 2 (two) times daily.      Marland Kitchen aspirin 81 MG tablet Take 81 mg by mouth 2 (two) times daily.       . carbidopa-levodopa (SINEMET IR) 25-100 MG per tablet Take 1 tablet by mouth 5 (five) times daily.      . cholecalciferol (VITAMIN D) 1000 UNITS tablet Take 1,000 Units by mouth daily.      . metoprolol tartrate (LOPRESSOR) 25 MG tablet Take 25 mg by mouth 2 (two) times daily.      . Multiple Vitamin (MULTIVITAMIN) tablet Take 1 tablet by mouth daily.      . Omega-3 Fatty Acids (FISH OIL PO) Take by mouth daily.      Marland Kitchen omeprazole (PRILOSEC) 20 MG capsule Take 20 mg by mouth daily.      Marland Kitchen rOPINIRole (REQUIP XL) 2 MG 24 hr tablet Take 2-6 mg by mouth 2 (two) times daily. 3 in the am and 1 at  bedtime      . Saw Palmetto, Serenoa repens, (SAW PALMETTO PO) Take by mouth daily.      . simvastatin (ZOCOR) 40 MG tablet Take 40 mg by mouth every evening.      . triamterene-hydrochlorothiazide (MAXZIDE-25) 37.5-25 MG per tablet Take 1 tablet by mouth daily.      . vitamin E 400 UNIT capsule Take 400 Units by mouth daily.       No current facility-administered medications for this visit.    Allergies:    Allergies  Allergen Reactions  . Sudafed [Pseudoephedrine Hcl]     Problems urinating     Social History:  The patient  reports that he has never smoked. He does not have any smokeless tobacco history on file. He reports that he does not drink alcohol or use illicit drugs.   ROS:  Please see the history of present illness.   Denies any syncope, orthopnea, PND, bleeding    PHYSICAL EXAM: VS:  BP 118/74  Pulse 55  Ht 6' (1.829 m)  Wt 195  lb (88.451 kg)  BMI 26.44 kg/m2 Well nourished, well developed, in no acute distress HEENT: normal Neck: no JVD Cardiac:  normal S1, S2; RRR; no murmur Lungs:  clear to auscultation bilaterally, no wheezing, rhonchi or rales Abd: soft, nontender, no hepatomegaly Ext: no edema Skin: warm and dry Neuro: no focal abnormalities noted  EKG:  Sinus bradycardia, 55, LAFB, nonspecific ST flattening. No changes from prior     ASSESSMENT AND PLAN:  1. Hyperlipidemia-continue with simvastatin. Promote dietary modification, exercise. 2. Mildly abnormal cardiac stress test-continue to promote primary prevention, aggressive risk factor modification. 3. Hypertension-excellent control. Dr. Kevan Ny. No changes made in medications. 4. Hyperlipidemia-continue with simvastatin. 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 No syncope.  6. One year followup  Signed, Donato Schultz, MD [facility]  08/14/2013 9:46 AM

## 2013-08-14 NOTE — Patient Instructions (Signed)
Your physician recommends that you continue on your current medications as directed. Please refer to the Current Medication list given to you today.  Your physician wants you to follow-up in: 1 year with Dr. Skains. You will receive a reminder letter in the mail two months in advance. If you don't receive a letter, please call our office to schedule the follow-up appointment.  

## 2013-11-23 ENCOUNTER — Other Ambulatory Visit: Payer: Self-pay | Admitting: Cardiology

## 2013-11-24 NOTE — Telephone Encounter (Signed)
Can you verify which form of this the patient is supposed to be taking? Thanks, MI

## 2013-11-26 NOTE — Telephone Encounter (Signed)
Its correct Metoprolol XL 24hr 25mg  once daily.

## 2014-05-20 ENCOUNTER — Encounter: Payer: Self-pay | Admitting: Internal Medicine

## 2014-08-10 ENCOUNTER — Encounter: Payer: Medicare Other | Attending: Internal Medicine

## 2014-08-10 VITALS — Ht 71.0 in | Wt 189.8 lb

## 2014-08-10 DIAGNOSIS — Z713 Dietary counseling and surveillance: Secondary | ICD-10-CM | POA: Diagnosis not present

## 2014-08-10 DIAGNOSIS — E119 Type 2 diabetes mellitus without complications: Secondary | ICD-10-CM | POA: Insufficient documentation

## 2014-08-10 NOTE — Progress Notes (Signed)

## 2014-08-17 ENCOUNTER — Encounter: Payer: Medicare Other | Attending: Internal Medicine

## 2014-08-17 DIAGNOSIS — Z713 Dietary counseling and surveillance: Secondary | ICD-10-CM | POA: Diagnosis not present

## 2014-08-17 DIAGNOSIS — E119 Type 2 diabetes mellitus without complications: Secondary | ICD-10-CM

## 2014-08-17 NOTE — Progress Notes (Signed)

## 2014-08-24 DIAGNOSIS — E119 Type 2 diabetes mellitus without complications: Secondary | ICD-10-CM | POA: Diagnosis not present

## 2014-08-26 NOTE — Progress Notes (Signed)
Patient was seen on 08/25/14 for the third of a series of three diabetes self-management courses at [facility]. The following learning objectives were met by the patient during this class:  . State the amount of activity recommended for healthy living . Describe activities suitable for individual needs . Identify ways to regularly incorporate activity into daily life . Identify barriers to activity and ways to over come these barriers  Identify diabetes medications being personally used and their primary action for lowering glucose and possible side effects . Describe role of stress on blood glucose and develop strategies to address psychosocial issues . Identify diabetes complications and ways to prevent them  Explain how to manage diabetes during illness . Evaluate success in meeting personal goal . Establish 2-3 goals that they will plan to diligently work on until they return for the  59-monthfollow-up visit  Goals:   I will count my carb choices at most meals and snacks  I will be active 30 minutes or more 10 times a week  I will take my diabetes medications as scheduled  I will increase my fluids and exercise  I will test my glucose at least 2 times a day, 7 days a week  I will look at patterns in my record book at least 4 days a month  To help manage stress I will  walk at least 7 times a week  Your patient has identified these potential barriers to change:  Motivation  Your patient has identified their diabetes self-care support plan as  Family Support  Plan:  Attend Core 4 in 4 months

## 2014-09-28 ENCOUNTER — Ambulatory Visit (INDEPENDENT_AMBULATORY_CARE_PROVIDER_SITE_OTHER): Payer: Medicare Other | Admitting: Cardiology

## 2014-09-28 ENCOUNTER — Encounter: Payer: Self-pay | Admitting: Cardiology

## 2014-09-28 VITALS — BP 110/74 | HR 65 | Ht 72.0 in | Wt 194.0 lb

## 2014-09-28 DIAGNOSIS — E7800 Pure hypercholesterolemia, unspecified: Secondary | ICD-10-CM

## 2014-09-28 DIAGNOSIS — I444 Left anterior fascicular block: Secondary | ICD-10-CM

## 2014-09-28 DIAGNOSIS — I10 Essential (primary) hypertension: Secondary | ICD-10-CM

## 2014-09-28 NOTE — Progress Notes (Signed)
[facility]. [address], [address], [place]  [address] Phone: [phone] Fax:  [phone]  Date:  09/28/2014   ID:  [name], DOB 1946/08/30, MRN [id number]  PCP:  Henrine Screws, MD   History of Present Illness: [name] is a 68 y.o. male who underwent a nuclear stress test for her prior preoperative evaluation during a knee operation, right total knee, which demonstrated a very mild small in size inferior wall reversible defect mostly in the distal region. Overall low risk test. 58%. He did not have any ST segment changes. Blood pressure response was normal. He exercised for 6 minutes and 37 seconds. No anginal symptoms. He is continuing to promote primary prevention. Walking uphill [facility].   Diet-controlled diabetes as well as early Parkinson's. Fatigue may be part of Parkinson's disease.  On simvastatin for hyperlipidemia, LDL at prior check just above 100. Better control with Crestor but he switched because of cost.  Doing well, enjoyed flyfishing in [place]. [address] [place], [place]   Wt Readings from Last 3 Encounters:  09/28/14 194 lb (87.998 kg)  08/10/14 189 lb 12.8 oz (86.093 kg)  08/14/13 195 lb (88.451 kg)     Past Medical History  Diagnosis Date  . Hypercholesteremia   . Nephrolithiasis   . Parkinson's disease      followed by Dr. Lezlie Octave at [facility] and [name], M.D. in [place]  . Hypertension   . Syncope   . Renal calculus   . Diabetes   . Allergic rhinitis   . BPH (benign prostatic hyperplasia)   . Erectile dysfunction     Staxyn 10 mg or Viagra worked well. 3 samples of Cialis 20 mg provided  . Dysphagia, pharyngeal phase     GERD diagnosed on barium swallow. Has small hiatal hernia. Symptomatically somewhat better on omeprazole but not entirely. We'll try b.i.d. therapy  . Edema     1+ in both ankles, likely multifactorial including medication such as Requip  . Presbycusis     and tinnitus - Dr. Izora Gala - August/2013  . Onychomycosis of toenail     April 27, 2013 - Dr. Inocencio Homes - podiatry, was in [place] - treating with oral Lamisil and topical nail therapy    Past Surgical History  Procedure Laterality Date  . Hand surgery    . Knee surgery    . Total knee arthroplasty      Current Outpatient Prescriptions  Medication Sig Dispense Refill  . amantadine (SYMMETREL) 100 MG capsule Take 100 mg by mouth 2 (two) times daily.    Marland Kitchen aspirin 81 MG tablet Take 81 mg by mouth 2 (two) times daily.     . carbidopa-levodopa (SINEMET IR) 25-100 MG per tablet Take 1 tablet by mouth 5 (five) times daily.    . cholecalciferol (VITAMIN D) 1000 UNITS tablet Take 1,000 Units by mouth daily.    . metoprolol succinate (TOPROL-XL) 25 MG 24 hr tablet TAKE 1 TABLET BY MOUTH ONCE DAILY. 90 tablet 3  . Multiple Vitamin (MULTIVITAMIN) tablet Take 1 tablet by mouth daily.    . Omega-3 Fatty Acids (FISH OIL PO) Take by mouth daily.    Marland Kitchen omeprazole (PRILOSEC) 20 MG capsule Take 20 mg by mouth daily.    Marland Kitchen rOPINIRole (REQUIP XL) 2 MG 24 hr tablet Take 2-6 mg by mouth 2 (two) times daily. 3 in the am and 1 at bedtime    .  Saw Palmetto, Serenoa repens, (SAW PALMETTO PO) Take by mouth daily.    . simvastatin (ZOCOR) 40 MG tablet Take 40 mg by mouth every evening.    . triamterene-hydrochlorothiazide (MAXZIDE-25) 37.5-25 MG per tablet Take 1 tablet by mouth daily.    . vitamin E 400 UNIT capsule Take 400 Units by mouth daily.     No current facility-administered medications for this visit.    Allergies:    Allergies  Allergen Reactions  . Sudafed [Pseudoephedrine Hcl]     Problems urinating     Social History:  The patient  reports that he has never smoked. He does not have any smokeless tobacco history on file. He reports that he does not drink alcohol or use illicit drugs.   ROS:  Please see the history of present illness.   Denies any syncope, orthopnea, PND, bleeding    PHYSICAL EXAM: VS:  BP 110/74  mmHg  Pulse 65  Ht 6' (1.829 m)  Wt 194 lb (87.998 kg)  BMI 26.31 kg/m2 Well nourished, well developed, in no acute distress HEENT: normal Neck: no JVD Cardiac:  normal S1, S2; RRR; no murmur Lungs:  clear to auscultation bilaterally, no wheezing, rhonchi or rales Abd: soft, nontender, no hepatomegaly Ext: no edema Skin: warm and dry Neuro:  right arm tremor noted  EKG:  09/28/14-sinus rhythm, premature atrial contraction 65 previous.  Sinus bradycardia, 55, LAFB, nonspecific ST flattening. No changes from prior     ASSESSMENT AND PLAN:  1. Hyperlipidemia-continue with simvastatin. Promote dietary modification, exercise. 2. Mildly abnormal cardiac stress test-continue to promote primary prevention, aggressive risk factor modification. Currently not having any symptoms. Doing very well. 3. Hypertension-excellent control. Dr. Inda Merlin. No changes made in medications. 4. Hyperlipidemia-continue with simvastatin. 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 No syncope.  6. Parkinson's-[facility]. Relatively stable. Right arm tremor noted 7. One year followup  Signed, Candee Furbish, MD [facility]  09/28/2014 10:02 AM

## 2014-09-28 NOTE — Patient Instructions (Addendum)
The current medical regimen is effective;  continue present plan and medications.  Follow up in 1 year with Dr. Skains.  You will receive a letter in the mail 2 months before you are due.  Please call us when you receive this letter to schedule your follow up appointment.  Thank you for choosing [facility]!!     

## 2014-12-20 ENCOUNTER — Ambulatory Visit: Payer: Medicare Other

## 2014-12-21 ENCOUNTER — Other Ambulatory Visit: Payer: Self-pay | Admitting: Cardiology

## 2014-12-24 ENCOUNTER — Encounter (HOSPITAL_BASED_OUTPATIENT_CLINIC_OR_DEPARTMENT_OTHER): Payer: Self-pay | Admitting: *Deleted

## 2014-12-24 ENCOUNTER — Emergency Department (HOSPITAL_BASED_OUTPATIENT_CLINIC_OR_DEPARTMENT_OTHER)
Admission: EM | Admit: 2014-12-24 | Discharge: 2014-12-24 | Disposition: A | Discharge status: Home / Self Care | Payer: Medicare Other | Attending: Emergency Medicine | Admitting: Emergency Medicine

## 2014-12-24 DIAGNOSIS — Z87448 Personal history of other diseases of urinary system: Secondary | ICD-10-CM | POA: Insufficient documentation

## 2014-12-24 DIAGNOSIS — Z7982 Long term (current) use of aspirin: Secondary | ICD-10-CM | POA: Insufficient documentation

## 2014-12-24 DIAGNOSIS — I10 Essential (primary) hypertension: Secondary | ICD-10-CM | POA: Insufficient documentation

## 2014-12-24 DIAGNOSIS — Z8619 Personal history of other infectious and parasitic diseases: Secondary | ICD-10-CM | POA: Insufficient documentation

## 2014-12-24 DIAGNOSIS — S0181XA Laceration without foreign body of other part of head, initial encounter: Secondary | ICD-10-CM | POA: Insufficient documentation

## 2014-12-24 DIAGNOSIS — W01198A Fall on same level from slipping, tripping and stumbling with subsequent striking against other object, initial encounter: Secondary | ICD-10-CM | POA: Insufficient documentation

## 2014-12-24 DIAGNOSIS — Y9289 Other specified places as the place of occurrence of the external cause: Secondary | ICD-10-CM | POA: Diagnosis not present

## 2014-12-24 DIAGNOSIS — E119 Type 2 diabetes mellitus without complications: Secondary | ICD-10-CM | POA: Insufficient documentation

## 2014-12-24 DIAGNOSIS — Z87442 Personal history of urinary calculi: Secondary | ICD-10-CM | POA: Diagnosis not present

## 2014-12-24 DIAGNOSIS — Y998 Other external cause status: Secondary | ICD-10-CM | POA: Insufficient documentation

## 2014-12-24 DIAGNOSIS — Z8669 Personal history of other diseases of the nervous system and sense organs: Secondary | ICD-10-CM | POA: Diagnosis not present

## 2014-12-24 DIAGNOSIS — Z79899 Other long term (current) drug therapy: Secondary | ICD-10-CM | POA: Diagnosis not present

## 2014-12-24 DIAGNOSIS — Y9389 Activity, other specified: Secondary | ICD-10-CM | POA: Insufficient documentation

## 2014-12-24 DIAGNOSIS — G20 Parkinson's disease: Secondary | ICD-10-CM | POA: Insufficient documentation

## 2014-12-24 MED ORDER — LIDOCAINE-EPINEPHRINE (PF) 2 %-1:200000 IJ SOLN: 10.0000 mL | Freq: Once | INTRAMUSCULAR | Status: DC

## 2014-12-24 MED ORDER — LIDOCAINE-EPINEPHRINE 2 %-1:100000 IJ SOLN
INTRAMUSCULAR | Status: AC
  Administered 2014-12-24: 20 mL via INTRADERMAL
  Filled 2014-12-24: qty 1

## 2014-12-24 MED ORDER — LIDOCAINE-EPINEPHRINE 2 %-1:100000 IJ SOLN: 20.0000 mL | Freq: Once | INTRAMUSCULAR | Status: AC

## 2014-12-24 NOTE — ED Notes (Signed)
2" laceration above his right eye. No loc. Bleeding controlled.

## 2014-12-24 NOTE — ED Notes (Signed)
D/c'd by other RN

## 2014-12-24 NOTE — ED Notes (Signed)
EDPA suturing at BS 

## 2014-12-24 NOTE — ED Notes (Signed)
EDP into room at [facility].

## 2014-12-24 NOTE — ED Notes (Signed)
I completed wound care, first cleaning dried blood off face,. I then applied bacitracin, and 2x2's with paper tape. I then took vitals.

## 2014-12-24 NOTE — Discharge Instructions (Signed)
Facial Laceration  A facial laceration is a cut on the face. These injuries can be painful and cause bleeding. Lacerations usually heal quickly, but they need special care to reduce scarring. DIAGNOSIS  Your health care provider will take a medical history, ask for details about how the injury occurred, and examine the wound to determine how deep the cut is. TREATMENT  Some facial lacerations may not require closure. Others may not be able to be closed because of an increased risk of infection. The risk of infection and the chance for successful closure will depend on various factors, including the amount of time since the injury occurred. The wound may be cleaned to help prevent infection. If closure is appropriate, pain medicines may be given if needed. Your health care provider will use stitches (sutures), wound glue (adhesive), or skin adhesive strips to repair the laceration. These tools bring the skin edges together to allow for faster healing and a better cosmetic outcome. If needed, you may also be given a tetanus shot. HOME CARE INSTRUCTIONS  Only take over-the-counter or prescription medicines as directed by your health care provider.  Follow your health care provider's instructions for wound care. These instructions will vary depending on the technique used for closing the wound. For Sutures:  Keep the wound clean and dry.   If you were given a bandage (dressing), you should change it at least once a day. Also change the dressing if it becomes wet or dirty, or as directed by your health care provider.   Wash the wound with soap and water 2 times a day. Rinse the wound off with water to remove all soap. Pat the wound dry with a clean towel.   After cleaning, apply a thin layer of the antibiotic ointment recommended by your health care provider. This will help prevent infection and keep the dressing from sticking.   You may shower as usual after the first 24 hours. Do not soak the  wound in water until the sutures are removed.   Get your sutures removed as directed by your health care provider. With facial lacerations, sutures should usually be taken out after 4-5 days to avoid stitch marks.   Wait a few days after your sutures are removed before applying any makeup. For Skin Adhesive Strips:  Keep the wound clean and dry.   Do not get the skin adhesive strips wet. You may bathe carefully, using caution to keep the wound dry.   If the wound gets wet, pat it dry with a clean towel.   Skin adhesive strips will fall off on their own. You may trim the strips as the wound heals. Do not remove skin adhesive strips that are still stuck to the wound. They will fall off in time.  For Wound Adhesive:  You may briefly wet your wound in the shower or bath. Do not soak or scrub the wound. Do not swim. Avoid periods of heavy sweating until the skin adhesive has fallen off on its own. After showering or bathing, gently pat the wound dry with a clean towel.   Do not apply liquid medicine, cream medicine, ointment medicine, or makeup to your wound while the skin adhesive is in place. This may loosen the film before your wound is healed.   If a dressing is placed over the wound, be careful not to apply tape directly over the skin adhesive. This may cause the adhesive to be pulled off before the wound is healed.   Avoid   prolonged exposure to sunlight or tanning lamps while the skin adhesive is in place.  The skin adhesive will usually remain in place for 5-10 days, then naturally fall off the skin. Do not pick at the adhesive film.  After Healing: Once the wound has healed, cover the wound with sunscreen during the day for 1 full year. This can help minimize scarring. Exposure to ultraviolet light in the first year will darken the scar. It can take 1-2 years for the scar to lose its redness and to heal completely.  SEEK IMMEDIATE MEDICAL CARE IF:  You have redness, pain, or  swelling around the wound.   You see ayellowish-white fluid (pus) coming from the wound.   You have chills or a fever.  MAKE SURE YOU:  Understand these instructions.  Will watch your condition.  Will get help right away if you are not doing well or get worse. Document Released: 11/08/2004 Document Revised: 07/22/2013 Document Reviewed: 05/14/2013 ExitCare Patient Information 2015 [employer]. This information is not intended to replace advice given to you by your health care provider. Make sure you discuss any questions you have with your health care provider.  

## 2014-12-24 NOTE — ED Provider Notes (Signed)
CSN: [id number]     Arrival date & time 12/24/14  1807 History   First MD Initiated Contact with Patient 12/24/14 1955     Chief Complaint  Patient presents with  . Facial Laceration     (Consider location/radiation/quality/duration/timing/severity/associated sxs/prior Treatment) HPI Comments: Patient presents today with a laceration of the right side of his forehead.  He states that approximately one hour prior to arrival he tripped over a toy tractor and fell to the ground.  He hit his head on a concrete floor when he fell.  Bleeding is controlled at this time.  No LOC.  He denies nausea, vomiting, or vision changes.  Denies neck pain, back pain, or extremity pain.  He is currently on 81 mg ASA daily.  He reports that his last Tetanus was 2 years ago.    The history is provided by the patient.    Past Medical History  Diagnosis Date  . Hypercholesteremia   . Nephrolithiasis   . Parkinson's disease      followed by Dr. Lezlie Octave at [facility] and [name], M.D. in [place]  . Hypertension   . Syncope   . Renal calculus   . Diabetes   . Allergic rhinitis   . BPH (benign prostatic hyperplasia)   . Erectile dysfunction     Staxyn 10 mg or Viagra worked well. 3 samples of Cialis 20 mg provided  . Dysphagia, pharyngeal phase     GERD diagnosed on barium swallow. Has small hiatal hernia. Symptomatically somewhat better on omeprazole but not entirely. We'll try b.i.d. therapy  . Edema     1+ in both ankles, likely multifactorial including medication such as Requip  . Presbycusis     and tinnitus - Dr. Izora Gala - August/2013  . Onychomycosis of toenail     April 27, 2013 - Dr. Inocencio Homes - podiatry, was in [place] - treating with oral Lamisil and topical nail therapy   Past Surgical History  Procedure Laterality Date  . Hand surgery    . Knee surgery    . Total knee arthroplasty     Family History  Problem Relation Age of Onset  . Atrial fibrillation Mother   . Breast  cancer Mother   . Emphysema Father   . Heart disease Father    History  Substance Use Topics  . Smoking status: Never Smoker   . Smokeless tobacco: Not on file  . Alcohol Use: No    Review of Systems  All other systems reviewed and are negative.     Allergies  Sudafed  Home Medications   Prior to Admission medications   Medication Sig Start Date End Date Taking? Authorizing Provider  amantadine (SYMMETREL) 100 MG capsule Take 100 mg by mouth 2 (two) times daily.    Historical Provider, MD  aspirin 81 MG tablet Take 81 mg by mouth 2 (two) times daily.     Historical Provider, MD  carbidopa-levodopa (SINEMET IR) 25-100 MG per tablet Take 1 tablet by mouth 5 (five) times daily.    Historical Provider, MD  cholecalciferol (VITAMIN D) 1000 UNITS tablet Take 1,000 Units by mouth daily.    Historical Provider, MD  metoprolol succinate (TOPROL-XL) 25 MG 24 hr tablet TAKE 1 TABLET BY MOUTH ONCE DAILY. 12/21/14   Jerline Pain, MD  Multiple Vitamin (MULTIVITAMIN) tablet Take 1 tablet by mouth daily.    Historical Provider, MD  Omega-3 Fatty Acids (FISH OIL PO) Take by mouth daily.  Historical Provider, MD  omeprazole (PRILOSEC) 20 MG capsule Take 20 mg by mouth daily.    Historical Provider, MD  rOPINIRole (REQUIP XL) 2 MG 24 hr tablet Take 2-6 mg by mouth 2 (two) times daily. 3 in the am and 1 at bedtime    Historical Provider, MD  Saw Palmetto, Serenoa repens, (SAW PALMETTO PO) Take by mouth daily.    Historical Provider, MD  simvastatin (ZOCOR) 40 MG tablet Take 40 mg by mouth every evening.    Historical Provider, MD  triamterene-hydrochlorothiazide (MAXZIDE-25) 37.5-25 MG per tablet Take 1 tablet by mouth daily.    Historical Provider, MD  vitamin E 400 UNIT capsule Take 400 Units by mouth daily.    Historical Provider, MD   BP 137/77 mmHg  Pulse 58  Temp(Src) 98 F (36.7 C) (Oral)  Resp 20  Ht 5\' 11"  (1.803 m)  Wt 194 lb (87.998 kg)  BMI 27.07 kg/m2  SpO2 100% Physical Exam   Constitutional: He appears well-developed and well-nourished.  HENT:  Mouth/Throat: Oropharynx is clear and moist.  2.5 cm linear laceration of the right forehead just superior to the right eyebrow  Eyes: EOM are normal. Pupils are equal, round, and reactive to light.  Neck: Normal range of motion. Neck supple.  Cardiovascular: Normal rate, regular rhythm and normal heart sounds.   Pulmonary/Chest: Effort normal and breath sounds normal.  Musculoskeletal: Normal range of motion.       Cervical back: He exhibits normal range of motion, no tenderness, no bony tenderness, no swelling, no edema and no deformity.       Thoracic back: He exhibits normal range of motion, no tenderness, no bony tenderness, no swelling, no edema and no deformity.       Lumbar back: He exhibits normal range of motion, no tenderness, no bony tenderness, no swelling, no edema and no deformity.  Neurological: He is alert. He has normal strength. No cranial nerve deficit or sensory deficit. Coordination and gait normal.  Skin: Skin is warm and dry.  Nursing note and vitals reviewed.   ED Course  Procedures (including critical care time) Labs Review Labs Reviewed - No data to display  Imaging Review No results found.   EKG Interpretation None     LACERATION REPAIR Performed by: Hyman Bible Authorized by: Hyman Bible Consent: Verbal consent obtained. Risks and benefits: risks, benefits and alternatives were discussed Consent given by: patient Patient identity confirmed: provided demographic data Prepped and Draped in normal sterile fashion Wound explored  Laceration Location: right forehead just superior to right eyebrow  Laceration Length: 2.5 cm  No Foreign Bodies seen or palpated  Anesthesia: local infiltration  Local anesthetic: lidocaine 2% with epinephrine  Anesthetic total: 3 ml  Irrigation method: syringe Amount of cleaning: standard  Skin closure: 6-0 Nylon  Number of  sutures: 5  Technique: simple interrupted  Patient tolerance: Patient tolerated the procedure well with no immediate complications.  MDM   Final diagnoses:  None   Patient presents today with a facial laceration of the right forehead that was sustained from a mechanical fall just prior to arrival.  He is not on any anticoagulants aside from 81 mg ASA.  No LOC.  Normal neurological exam.  Do not feel that imaging is indicated at this time.  Laceration repaired without difficulty.  Patient stable for discharge.  Return precautions given.    Hyman Bible, PA-C 12/24/14 2213  Blanchie Dessert, MD 12/25/14 0020

## 2014-12-24 NOTE — ED Notes (Signed)
Alert, NAD, calm, interactive, no dyspnea noted, speech clear, reports, "tripped over toy, fell and hit R forehead", R forehead lack ~ 1.5", above R eyebrow, bleeding controlled. (denies: LOC, nv, dizziness, visual changes "although not wearing my glasses"), wife at [facility].

## 2014-12-24 NOTE — ED Notes (Signed)
EDPA out of room, finished at [facility].

## 2015-10-20 ENCOUNTER — Ambulatory Visit: Payer: Self-pay | Admitting: Surgery

## 2015-10-31 ENCOUNTER — Encounter (HOSPITAL_BASED_OUTPATIENT_CLINIC_OR_DEPARTMENT_OTHER): Payer: Self-pay | Admitting: *Deleted

## 2015-11-01 ENCOUNTER — Encounter (HOSPITAL_BASED_OUTPATIENT_CLINIC_OR_DEPARTMENT_OTHER)
Admission: RE | Admit: 2015-11-01 | Discharge: 2015-11-01 | Disposition: A | Discharge status: Home / Self Care | Payer: Medicare Other | Source: Ambulatory Visit | Attending: Surgery | Admitting: Surgery

## 2015-11-01 ENCOUNTER — Other Ambulatory Visit: Payer: Self-pay

## 2015-11-01 DIAGNOSIS — G20 Parkinson's disease: Secondary | ICD-10-CM | POA: Diagnosis not present

## 2015-11-01 DIAGNOSIS — K219 Gastro-esophageal reflux disease without esophagitis: Secondary | ICD-10-CM | POA: Diagnosis not present

## 2015-11-01 DIAGNOSIS — N400 Enlarged prostate without lower urinary tract symptoms: Secondary | ICD-10-CM | POA: Diagnosis not present

## 2015-11-01 DIAGNOSIS — K4090 Unilateral inguinal hernia, without obstruction or gangrene, not specified as recurrent: Secondary | ICD-10-CM | POA: Diagnosis not present

## 2015-11-01 DIAGNOSIS — Z79899 Other long term (current) drug therapy: Secondary | ICD-10-CM | POA: Diagnosis not present

## 2015-11-01 DIAGNOSIS — Z7982 Long term (current) use of aspirin: Secondary | ICD-10-CM | POA: Diagnosis not present

## 2015-11-01 DIAGNOSIS — E119 Type 2 diabetes mellitus without complications: Secondary | ICD-10-CM | POA: Diagnosis not present

## 2015-11-01 DIAGNOSIS — Z7984 Long term (current) use of oral hypoglycemic drugs: Secondary | ICD-10-CM | POA: Diagnosis not present

## 2015-11-01 LAB — BASIC METABOLIC PANEL
ANION GAP: 6 (ref 5–15)
BUN: 18 mg/dL (ref 6–20)
CALCIUM: 9.8 mg/dL (ref 8.9–10.3)
CO2: 30 mmol/L (ref 22–32)
Chloride: 103 mmol/L (ref 101–111)
Creatinine, Ser: 1.15 mg/dL (ref 0.61–1.24)
GLUCOSE: 108 mg/dL — AB (ref 65–99)
POTASSIUM: 5 mmol/L (ref 3.5–5.1)
Sodium: 139 mmol/L (ref 135–145)

## 2015-11-03 ENCOUNTER — Encounter (HOSPITAL_BASED_OUTPATIENT_CLINIC_OR_DEPARTMENT_OTHER): Payer: Self-pay | Admitting: Surgery

## 2015-11-03 DIAGNOSIS — K4090 Unilateral inguinal hernia, without obstruction or gangrene, not specified as recurrent: Secondary | ICD-10-CM

## 2015-11-03 NOTE — Anesthesia Preprocedure Evaluation (Addendum)
Anesthesia Evaluation  Patient identified by MRN, date of birth, ID band Patient awake    Reviewed: Allergy & Precautions, NPO status , Patient's Chart, lab work & pertinent test results  Airway Mallampati: II  TM Distance: >3 FB Neck ROM: Limited    Dental  (+) Teeth Intact   Pulmonary    breath sounds clear to auscultation       Cardiovascular hypertension,  Rhythm:Regular Rate:Normal     Neuro/Psych Parkinsons   Neuromuscular disease    GI/Hepatic GERD  ,  Endo/Other  diabetes  Renal/GU Renal InsufficiencyRenal disease     Musculoskeletal   Abdominal   Peds  Hematology   Anesthesia Other Findings   Reproductive/Obstetrics                            Anesthesia Physical Anesthesia Plan  ASA: III  Anesthesia Plan: General   Post-op Pain Management: GA combined w/ Regional for post-op pain   Induction: Intravenous  Airway Management Planned: LMA  Additional Equipment:   Intra-op Plan:   Post-operative Plan: Extubation in OR  Informed Consent: I have reviewed the patients History and Physical, chart, labs and discussed the procedure including the risks, benefits and alternatives for the proposed anesthesia with the patient or authorized representative who has indicated his/her understanding and acceptance.   Dental advisory given  Plan Discussed with: CRNA and Surgeon  Anesthesia Plan Comments:         Anesthesia Quick Evaluation

## 2015-11-03 NOTE — H&P (Signed)
[facility]  [name] DOB: March 20, 1946 Married / Language: Cleophus Molt / Race: White Male  History of Present Illness Patient words: LIH.  The patient is a 70 year old male who presents with an inguinal hernia.  Patient referred by Dr. Mertha Finders for evaluation of newly diagnosed left inguinal hernia. Patient first noted a burning type pain in the left groin approximately 6-8 weeks ago. This has progressed and he now has a visible bulge. He is consciously had to push the hernia back in. It has always been reducible. He denies any change in bowel habits and has had no signs or symptoms of obstruction. Patient has had no prior abdominal surgery and specifically no prior hernia repairs. He now presents for evaluation. Past medical history is notable for Parkinson's disease. Patient does take baby aspirin daily. He is prediabetic and takes metformin.  Other Problems Back Pain Diabetes Mellitus Enlarged Prostate Gastroesophageal Reflux Disease Other disease, cancer, significant illness  Past Surgical History Knee Surgery Bilateral.  Diagnostic Studies History Colonoscopy 1-5 years ago  Allergies Sudafed *NASAL AGENTS - SYSTEMIC AND TOPICAL* urinary retention  Medication History Amantadine HCl (100MG  Capsule, Oral) Active. Carbidopa-Levodopa (25-100MG  Tablet, Oral) Active. Metoprolol Succinate ER (25MG  Tablet ER 24HR, Oral) Active. Omeprazole (20MG  Capsule DR, Oral) Active. Requip XL (2MG  Tablet ER 24HR, Oral) Active. Simvastatin (40MG  Tablet, Oral) Active. Tamsulosin HCl (0.4MG  Capsule, Oral) Active. Triamterene-HCTZ (37.5-25MG  Tablet, Oral) Active. Glimepiride (4MG  Tablet, Oral) Active. Glucophage (1000MG  Tablet, Oral) Active. Aspirin (81MG  Tablet DR, Oral) Active. Medications Reconciled  Social History Caffeine use Carbonated beverages, Tea. No alcohol use No drug use Tobacco use Never  smoker.  Family History Breast Cancer Mother. Cancer Brother. Heart Disease Father, Mother. Respiratory Condition Father.  Review of Systems  HEENT Present- Hearing Loss, Ringing in the Ears, Seasonal Allergies and Wears glasses/contact lenses. Not Present- Earache, Hoarseness, Nose Bleed, Oral Ulcers, Sinus Pain, Sore Throat, Visual Disturbances and Yellow Eyes. Respiratory Present- Snoring. Not Present- Bloody sputum, Chronic Cough, Difficulty Breathing and Wheezing. Cardiovascular Present- Swelling of Extremities. Not Present- Chest Pain, Difficulty Breathing Lying Down, Leg Cramps, Palpitations, Rapid Heart Rate and Shortness of Breath. Male Genitourinary Present- Urgency. Not Present- Blood in Urine, Change in Urinary Stream, Frequency, Impotence, Nocturia, Painful Urination and Urine Leakage.  Vitals Weight: 202 lb Height: 71in Body Surface Area: 2.12 m Body Mass Index: 28.17 kg/m  Temp.: 97.2F(Oral)  Pulse: 60 (Regular)  BP: 124/82 (Sitting, Left Arm, Standard)   Physical Exam   General - appears comfortable, no distress; not diaphorectic  HEENT - normocephalic; sclerae clear, gaze conjugate; mucous membranes moist, dentition good; voice normal  Neck - symmetric on extension; no palpable anterior or posterior cervical adenopathy; no palpable masses in the thyroid bed  Chest - clear bilaterally without rhonchi, rales, or wheeze  Cor - regular rhythm with normal rate; no significant murmur  Abd - soft without distension  GU - palpation in the right inguinal canal with cough and Valsalva shows no sign of inguinal hernia; on the left there is an obvious bulge present; palpation shows a moderate sized left inguinal hernia which is reducible but spontaneously prolapses and augments with Valsalva and cough  Ext - non-tender without significant edema or lymphedema  Neuro - grossly intact; moderate tremor in her right hand at rest   Assessment & Plan    REDUCIBLE LEFT INGUINAL HERNIA (K40.90)  The patient presents with left inguinal hernia. This has become mildly symptomatic. It has increased in  size over the past 6-8 weeks.  Patient is provided with written literature on hernia surgery to review at home.  I have recommended repair of left inguinal hernia with mesh as an outpatient surgical procedure. We discussed risk and benefits of the procedure including the potential for recurrence. We have discussed restrictions on his activities after surgery. Patient understands and wishes to proceed in the near future.  The risks and benefits of the procedure have been discussed at length with the patient. The patient understands the proposed procedure, potential alternative treatments, and the course of recovery to be expected. All of the patient's questions have been answered at this time. The patient wishes to proceed with surgery.  Earnstine Regal, MD, [facility] Office: [phone]

## 2015-11-04 ENCOUNTER — Encounter (HOSPITAL_BASED_OUTPATIENT_CLINIC_OR_DEPARTMENT_OTHER): Payer: Self-pay | Admitting: Certified Registered"

## 2015-11-04 ENCOUNTER — Ambulatory Visit (HOSPITAL_BASED_OUTPATIENT_CLINIC_OR_DEPARTMENT_OTHER)
Admission: RE | Admit: 2015-11-04 | Discharge: 2015-11-04 | Disposition: A | Discharge status: Home / Self Care | Payer: Medicare Other | Source: Ambulatory Visit | Attending: Surgery | Admitting: Surgery

## 2015-11-04 ENCOUNTER — Ambulatory Visit (HOSPITAL_BASED_OUTPATIENT_CLINIC_OR_DEPARTMENT_OTHER): Payer: Medicare Other | Admitting: Anesthesiology

## 2015-11-04 ENCOUNTER — Encounter (HOSPITAL_BASED_OUTPATIENT_CLINIC_OR_DEPARTMENT_OTHER)
Admission: RE | Disposition: A | Discharge status: Home / Self Care | Payer: Self-pay | Source: Ambulatory Visit | Attending: Surgery

## 2015-11-04 DIAGNOSIS — E119 Type 2 diabetes mellitus without complications: Secondary | ICD-10-CM | POA: Insufficient documentation

## 2015-11-04 DIAGNOSIS — Z7984 Long term (current) use of oral hypoglycemic drugs: Secondary | ICD-10-CM | POA: Insufficient documentation

## 2015-11-04 DIAGNOSIS — K219 Gastro-esophageal reflux disease without esophagitis: Secondary | ICD-10-CM | POA: Diagnosis not present

## 2015-11-04 DIAGNOSIS — K4090 Unilateral inguinal hernia, without obstruction or gangrene, not specified as recurrent: Secondary | ICD-10-CM | POA: Insufficient documentation

## 2015-11-04 DIAGNOSIS — G20 Parkinson's disease: Secondary | ICD-10-CM | POA: Insufficient documentation

## 2015-11-04 DIAGNOSIS — N400 Enlarged prostate without lower urinary tract symptoms: Secondary | ICD-10-CM | POA: Insufficient documentation

## 2015-11-04 DIAGNOSIS — Z7982 Long term (current) use of aspirin: Secondary | ICD-10-CM | POA: Insufficient documentation

## 2015-11-04 DIAGNOSIS — Z79899 Other long term (current) drug therapy: Secondary | ICD-10-CM | POA: Insufficient documentation

## 2015-11-04 HISTORY — DX: Gastro-esophageal reflux disease without esophagitis: K21.9

## 2015-11-04 HISTORY — DX: Unspecified osteoarthritis, unspecified site: M19.90

## 2015-11-04 HISTORY — PX: INSERTION OF MESH: SHX5868

## 2015-11-04 HISTORY — PX: INGUINAL HERNIA REPAIR: SHX194

## 2015-11-04 LAB — GLUCOSE, CAPILLARY
GLUCOSE-CAPILLARY: 124 mg/dL — AB (ref 65–99)
Glucose-Capillary: 132 mg/dL — ABNORMAL HIGH (ref 65–99)

## 2015-11-04 SURGERY — REPAIR, HERNIA, INGUINAL, ADULT
Anesthesia: General | Site: Groin | Laterality: Left

## 2015-11-04 MED ORDER — DEXAMETHASONE SODIUM PHOSPHATE 4 MG/ML IJ SOLN
INTRAMUSCULAR | Status: DC | PRN
  Administered 2015-11-04: 6 mg via INTRAVENOUS

## 2015-11-04 MED ORDER — EPHEDRINE SULFATE 50 MG/ML IJ SOLN
INTRAMUSCULAR | Status: DC | PRN
  Administered 2015-11-04 (×3): 10 mg via INTRAVENOUS

## 2015-11-04 MED ORDER — FENTANYL CITRATE (PF) 100 MCG/2ML IJ SOLN
INTRAMUSCULAR | Status: AC
  Filled 2015-11-04: qty 2

## 2015-11-04 MED ORDER — HYDROMORPHONE HCL 1 MG/ML IJ SOLN: 0.2500 mg | INTRAMUSCULAR | Status: DC | PRN

## 2015-11-04 MED ORDER — GLYCOPYRROLATE 0.2 MG/ML IJ SOLN: 0.2000 mg | Freq: Once | INTRAMUSCULAR | Status: DC | PRN

## 2015-11-04 MED ORDER — FENTANYL CITRATE (PF) 100 MCG/2ML IJ SOLN
50.0000 ug | INTRAMUSCULAR | Status: DC | PRN
  Administered 2015-11-04 (×2): 50 ug via INTRAVENOUS

## 2015-11-04 MED ORDER — LACTATED RINGERS IV SOLN
INTRAVENOUS | Status: DC
  Administered 2015-11-04: 07:00:00 via INTRAVENOUS

## 2015-11-04 MED ORDER — ONDANSETRON HCL 4 MG/2ML IJ SOLN
INTRAMUSCULAR | Status: AC
  Filled 2015-11-04: qty 2

## 2015-11-04 MED ORDER — CEFAZOLIN SODIUM-DEXTROSE 2-3 GM-% IV SOLR
2.0000 g | INTRAVENOUS | Status: AC
  Administered 2015-11-04: 2 g via INTRAVENOUS

## 2015-11-04 MED ORDER — ONDANSETRON HCL 4 MG/2ML IJ SOLN
INTRAMUSCULAR | Status: DC | PRN
Start: 2015-11-04 — End: 2015-11-04
  Administered 2015-11-04: 4 mg via INTRAVENOUS

## 2015-11-04 MED ORDER — BUPIVACAINE HCL (PF) 0.5 % IJ SOLN
INTRAMUSCULAR | Status: AC
  Filled 2015-11-04: qty 30

## 2015-11-04 MED ORDER — SUGAMMADEX SODIUM 200 MG/2ML IV SOLN
INTRAVENOUS | Status: DC | PRN
  Administered 2015-11-04: 183.6 mg via INTRAVENOUS

## 2015-11-04 MED ORDER — BUPIVACAINE HCL (PF) 0.5 % IJ SOLN
INTRAMUSCULAR | Status: DC | PRN
Start: 2015-11-04 — End: 2015-11-04
  Administered 2015-11-04: 30 mL

## 2015-11-04 MED ORDER — HYDROCODONE-ACETAMINOPHEN 5-325 MG PO TABS: 1.0000 | ORAL_TABLET | ORAL | Status: DC | PRN

## 2015-11-04 MED ORDER — MIDAZOLAM HCL 2 MG/2ML IJ SOLN
1.0000 mg | INTRAMUSCULAR | Status: DC | PRN
  Administered 2015-11-04: 1 mg via INTRAVENOUS

## 2015-11-04 MED ORDER — PHENYLEPHRINE HCL 10 MG/ML IJ SOLN
INTRAMUSCULAR | Status: DC | PRN
  Administered 2015-11-04 (×2): 40 ug via INTRAVENOUS

## 2015-11-04 MED ORDER — CEFAZOLIN SODIUM-DEXTROSE 2-3 GM-% IV SOLR
INTRAVENOUS | Status: AC
  Filled 2015-11-04: qty 50

## 2015-11-04 MED ORDER — SUGAMMADEX SODIUM 200 MG/2ML IV SOLN
INTRAVENOUS | Status: AC
  Filled 2015-11-04: qty 2

## 2015-11-04 MED ORDER — LIDOCAINE HCL (CARDIAC) 20 MG/ML IV SOLN
INTRAVENOUS | Status: AC
  Filled 2015-11-04: qty 5

## 2015-11-04 MED ORDER — MIDAZOLAM HCL 2 MG/2ML IJ SOLN
INTRAMUSCULAR | Status: AC
  Filled 2015-11-04: qty 2

## 2015-11-04 MED ORDER — DEXAMETHASONE SODIUM PHOSPHATE 10 MG/ML IJ SOLN
INTRAMUSCULAR | Status: AC
  Filled 2015-11-04: qty 1

## 2015-11-04 MED ORDER — PROPOFOL 10 MG/ML IV BOLUS
INTRAVENOUS | Status: DC | PRN
Start: 2015-11-04 — End: 2015-11-04
  Administered 2015-11-04: 150 mg via INTRAVENOUS

## 2015-11-04 MED ORDER — ROCURONIUM BROMIDE 50 MG/5ML IV SOLN
INTRAVENOUS | Status: AC
  Filled 2015-11-04: qty 1

## 2015-11-04 MED ORDER — BUPIVACAINE HCL (PF) 0.5 % IJ SOLN
INTRAMUSCULAR | Status: DC | PRN
  Administered 2015-11-04: 20 mL

## 2015-11-04 MED ORDER — ROCURONIUM BROMIDE 100 MG/10ML IV SOLN
INTRAVENOUS | Status: DC | PRN
  Administered 2015-11-04: 40 mg via INTRAVENOUS

## 2015-11-04 MED ORDER — SCOPOLAMINE 1 MG/3DAYS TD PT72: 1.0000 | MEDICATED_PATCH | Freq: Once | TRANSDERMAL | Status: DC

## 2015-11-04 MED ORDER — PROMETHAZINE HCL 25 MG/ML IJ SOLN: 6.2500 mg | INTRAMUSCULAR | Status: DC | PRN

## 2015-11-04 SURGICAL SUPPLY — 50 items
APL SKNCLS STERI-STRIP NONHPOA (GAUZE/BANDAGES/DRESSINGS) ×1
BENZOIN TINCTURE PRP APPL 2/3 (GAUZE/BANDAGES/DRESSINGS) ×3 IMPLANT
BLADE CLIPPER SURG (BLADE) ×3 IMPLANT
BLADE SURG 15 STRL LF DISP TIS (BLADE) ×1 IMPLANT
BLADE SURG 15 STRL SS (BLADE) ×3
CANISTER SUCT 1200ML W/VALVE (MISCELLANEOUS) IMPLANT
CHLORAPREP W/TINT 26ML (MISCELLANEOUS) ×3 IMPLANT
CLEANER CAUTERY TIP 5X5 PAD (MISCELLANEOUS) ×1 IMPLANT
CLOSURE WOUND 1/2 X4 (GAUZE/BANDAGES/DRESSINGS) ×1
COVER BACK TABLE 60X90IN (DRAPES) ×3 IMPLANT
COVER MAYO STAND STRL (DRAPES) ×3 IMPLANT
DECANTER SPIKE VIAL GLASS SM (MISCELLANEOUS) IMPLANT
DRAIN PENROSE 1/2X12 LTX STRL (WOUND CARE) ×3 IMPLANT
DRAPE LAPAROTOMY 100X72 PEDS (DRAPES) ×3 IMPLANT
DRAPE UTILITY XL STRL (DRAPES) ×3 IMPLANT
ELECT REM PT RETURN 9FT ADLT (ELECTROSURGICAL) ×3
ELECTRODE REM PT RTRN 9FT ADLT (ELECTROSURGICAL) ×1 IMPLANT
GLOVE BIOGEL PI IND STRL 7.0 (GLOVE) IMPLANT
GLOVE BIOGEL PI IND STRL 8 (GLOVE) IMPLANT
GLOVE BIOGEL PI INDICATOR 7.0 (GLOVE) ×4
GLOVE BIOGEL PI INDICATOR 8 (GLOVE) ×2
GLOVE SURG ORTHO 8.0 STRL STRW (GLOVE) ×3 IMPLANT
GLOVE SURG SS PI 6.5 STRL IVOR (GLOVE) ×2 IMPLANT
GLOVE SURG SS PI 7.5 STRL IVOR (GLOVE) ×2 IMPLANT
GOWN STRL REUS W/ TWL LRG LVL3 (GOWN DISPOSABLE) ×1 IMPLANT
GOWN STRL REUS W/ TWL XL LVL3 (GOWN DISPOSABLE) ×1 IMPLANT
GOWN STRL REUS W/TWL LRG LVL3 (GOWN DISPOSABLE) ×6
GOWN STRL REUS W/TWL XL LVL3 (GOWN DISPOSABLE) ×3
MESH ULTRAPRO 3X6 7.6X15CM (Mesh General) ×2 IMPLANT
NDL HYPO 25X1 1.5 SAFETY (NEEDLE) ×1 IMPLANT
NEEDLE HYPO 25X1 1.5 SAFETY (NEEDLE) ×3 IMPLANT
NS IRRIG 1000ML POUR BTL (IV SOLUTION) ×3 IMPLANT
PACK BASIN DAY SURGERY FS (CUSTOM PROCEDURE TRAY) ×3 IMPLANT
PAD CLEANER CAUTERY TIP 5X5 (MISCELLANEOUS) ×2
PENCIL BUTTON HOLSTER BLD 10FT (ELECTRODE) ×3 IMPLANT
SLEEVE SCD COMPRESS KNEE MED (MISCELLANEOUS) ×2 IMPLANT
SPONGE GAUZE 4X4 12PLY STER LF (GAUZE/BANDAGES/DRESSINGS) ×2 IMPLANT
STRIP CLOSURE SKIN 1/2X4 (GAUZE/BANDAGES/DRESSINGS) ×2 IMPLANT
SUT MNCRL AB 4-0 PS2 18 (SUTURE) ×3 IMPLANT
SUT NOVA NAB GS-22 2 0 T19 (SUTURE) ×6 IMPLANT
SUT SILK 2 0 SH (SUTURE) ×3 IMPLANT
SUT SILK 2 0 TIES 17X18 (SUTURE)
SUT SILK 2-0 18XBRD TIE BLK (SUTURE) IMPLANT
SUT VICRYL 3-0 CR8 SH (SUTURE) ×3 IMPLANT
SYR CONTROL 10ML LL (SYRINGE) ×3 IMPLANT
TOWEL OR 17X24 6PK STRL BLUE (TOWEL DISPOSABLE) ×4 IMPLANT
TOWEL OR NON WOVEN STRL DISP B (DISPOSABLE) ×3 IMPLANT
TUBE CONNECTING 20'X1/4 (TUBING)
TUBE CONNECTING 20X1/4 (TUBING) IMPLANT
YANKAUER SUCT BULB TIP NO VENT (SUCTIONS) IMPLANT

## 2015-11-04 NOTE — Anesthesia Procedure Notes (Addendum)
Anesthesia Regional Block:  TAP block  Pre-Anesthetic Checklist: ,, timeout performed, Correct Patient, Correct Site, Correct Laterality, Correct Procedure, Correct Position, site marked, Risks and benefits discussed,  Surgical consent,  Pre-op evaluation,  At surgeon's request and post-op pain management  Laterality: Left and Lower  Prep: chloraprep       Needles:   Needle Type: Echogenic Stimulator Needle     Needle Length: 9cm 9 cm Needle Gauge: 22 and 22 G  Needle insertion depth: 5 cm   Additional Needles:  Procedures: ultrasound guided (picture in chart) TAP block Narrative:  Start time: 11/04/2015 7:10 AM End time: 11/04/2015 7:20 AM Injection made incrementally with aspirations every 5 mL.  Performed by: Personally  Anesthesiologist: MASSAGEE, Zacchaeus  Additional Notes: Tolerated well   Procedure Name: Intubation Date/Time: 11/04/2015 7:40 AM Performed by: Zaire Vanbuskirk D Pre-anesthesia Checklist: Patient identified, Emergency Drugs available, Suction available and Patient being monitored Patient Re-evaluated:Patient Re-evaluated prior to inductionOxygen Delivery Method: Circle System Utilized Preoxygenation: Pre-oxygenation with 100% oxygen Intubation Type: IV induction Ventilation: Mask ventilation without difficulty Laryngoscope Size: Mac and 3 Grade View: Grade II Tube type: Oral Tube size: 7.0 mm Number of attempts: 1 Airway Equipment and Method: Stylet and Oral airway Placement Confirmation: ETT inserted through vocal cords under direct vision,  positive ETCO2 and breath sounds checked- equal and bilateral Secured at: 21 cm Tube secured with: Tape Dental Injury: Teeth and Oropharynx as per pre-operative assessment

## 2015-11-04 NOTE — Anesthesia Postprocedure Evaluation (Signed)
Anesthesia Post Note  Patient: [name]  Procedure(s) Performed: Procedure(s) (LRB): LEFT INGUINAL HERNIA REPAIR WITH MESH (Left) INSERTION OF MESH (Left)  Patient location during evaluation: PACU Anesthesia Type: General Level of consciousness: awake and alert Pain management: pain level controlled Vital Signs Assessment: post-procedure vital signs reviewed and stable Respiratory status: spontaneous breathing, nonlabored ventilation, respiratory function stable and patient connected to nasal cannula oxygen Cardiovascular status: blood pressure returned to baseline and stable Postop Assessment: no signs of nausea or vomiting Anesthetic complications: no    Last Vitals:  Filed Vitals:   11/04/15 0930 11/04/15 1000  BP: 113/66 128/67  Pulse: 60 54  Temp:  36.7 C  Resp: 15 16    Last Pain:  Filed Vitals:   11/04/15 1014  PainSc: 0-No pain                 Ishana Blades,JAMES TERRILL

## 2015-11-04 NOTE — Op Note (Signed)
Inguinal Hernia, Open, Procedure Note  Pre-operative Diagnosis:  Left inguinal hernia, reducible  Post-operative Diagnosis: same  Procedure: Left inguinal hernia repair with Ethicon Ultrapro mesh  Surgeon:  Earnstine Regal, MD, FACS  Anesthesia:  General  Preparation:  Chlora-prep  Estimated Blood Loss: Minimal  Complications:  none  Indications: The patient presented with a left, reducible hernia.    Procedure Details  The patient was evaluated in the holding area. All of the patient's questions were answered and the proposed procedure was confirmed. The site of the procedure was properly marked. The patient was taken to the Operating Room, identified by name, and the procedure verified as inguinal hernia repair.  The patient was placed in the supine position and underwent induction of anesthesia. A "Time Out" was performed per routine. The lower abdomen and groin were prepped and draped in the usual aseptic fashion.  After ascertaining that an adequate level of anesthesia had been obtained, an incision was made in the groin with a #10 blade.  Dissection was carried through the subcutaneous tissues and hemostasis obtained with the electrocautery.  A Gelpi retractor was placed for exposure.  The external oblique fascia was incised in line with it's fibers and extended through the external inguinal ring.  The cord structures were dissected out of the inguinal canal and encircled with a Penrose drain.  The floor of the inguinal canal was dissected out.  There was some laxity of the inguinal floor without a definite fascial defect.  The cord was explored and two "masses" were identified and resected and submitted to pathology for review.  A small indirect hernia sac was identified and a high ligation performed and the sac discarded.  The floor of the inguinal canal was reconstructed with Ethicon Ultrapro mesh cut to the appropriate dimensions.  It was secured to the pubic tubercle with a 2-0  Novafil suture and along the inguinal ligament with a running 2-0 Novafil suture.  Mesh was split to accommodate the cord structures.  The superior margin of the mesh was secured to the transversalis and internal oblique musculature with interrupted 2-0 Novafil sutures.  The tails of the mesh were overlapped lateral to the cord structures and secured to the inguinal ligament with interrupted 2-0 Novafil sutures to recreate the internal inguinal ring.  Cord structures were returned to the inguinal canal.  Local anesthetic was infiltrated throughout the field.  External oblique fascia was closed with interrupted 3-0 Vicryl sutures.  Subcutaneous tissues were closed with interrupted 3-0 Vicryl sutures.  Skin was anesthetized with local anesthetic, and the skin edges were re-approximated with a running 4-0 Monocryl suture.  Wound was washed and dried and benzoin and steristrips were applied.  A gauze dressing was applied.  Instrument, sponge, and needle counts were correct prior to closure and at the conclusion of the case.  The patient tolerated the procedure well.  The patient was awakened from anesthesia and brought to the recovery room in stable condition.  Earnstine Regal, MD, [facility] Office: [phone]

## 2015-11-04 NOTE — Progress Notes (Signed)
AssistedDr. Massagee with right, ultrasound guided, transabdominal plane block. Side rails up, monitors on throughout procedure. See vital signs in flow sheet. Tolerated Procedure well.  

## 2015-11-04 NOTE — Discharge Instructions (Signed)

## 2015-11-04 NOTE — Interval H&P Note (Signed)
History and Physical Interval Note:  11/04/2015 7:11 AM  [name]  has presented today for surgery, with the diagnosis of Left Inguinal Hernia, reducible  The various methods of treatment have been discussed with the patient and family. After consideration of risks, benefits and other options for treatment, the patient has consented to    Procedure(s): LEFT INGUINAL HERNIA REPAIR WITH MESH (Left) INSERTION OF MESH (Left) as a surgical intervention .    The patient's history has been reviewed, patient examined, no change in status, stable for surgery.  I have reviewed the patient's chart and labs.  Questions were answered to the patient's satisfaction.    Earnstine Regal, MD, [facility] Office: [place]

## 2015-11-04 NOTE — Transfer of Care (Signed)
Immediate Anesthesia Transfer of Care Note  Patient: [name]  Procedure(s) Performed: Procedure(s): LEFT INGUINAL HERNIA REPAIR WITH MESH (Left) INSERTION OF MESH (Left)  Patient Location: PACU  Anesthesia Type:GA combined with regional for post-op pain  Level of Consciousness: awake and patient cooperative  Airway & Oxygen Therapy: Patient Spontanous Breathing and Patient connected to face mask oxygen  Post-op Assessment: Report given to RN and Post -op Vital signs reviewed and stable  Post vital signs: Reviewed and stable  Last Vitals:  Filed Vitals:   11/04/15 0715 11/04/15 0720  BP: 119/68   Pulse: 59 61  Temp:    Resp: 13 14    Complications: No apparent anesthesia complications

## 2015-11-07 ENCOUNTER — Encounter (HOSPITAL_BASED_OUTPATIENT_CLINIC_OR_DEPARTMENT_OTHER): Payer: Self-pay | Admitting: Surgery

## 2015-11-07 NOTE — Progress Notes (Signed)
Quick Note:  Please contact patient and notify of benign pathology results.  Christeen Lai M. Davey Limas, MD, FACS [facility] Office: [phone]   ______ 

## 2015-12-19 ENCOUNTER — Other Ambulatory Visit: Payer: Self-pay | Admitting: Cardiology

## 2016-02-06 ENCOUNTER — Other Ambulatory Visit: Payer: Self-pay | Admitting: Internal Medicine

## 2016-02-06 DIAGNOSIS — R1313 Dysphagia, pharyngeal phase: Secondary | ICD-10-CM

## 2016-02-08 ENCOUNTER — Ambulatory Visit
Admission: RE | Admit: 2016-02-08 | Discharge: 2016-02-08 | Disposition: A | Discharge status: Home / Self Care | Payer: Medicare Other | Source: Ambulatory Visit | Attending: Internal Medicine | Admitting: Internal Medicine

## 2016-02-08 DIAGNOSIS — R1313 Dysphagia, pharyngeal phase: Secondary | ICD-10-CM

## 2016-03-13 NOTE — Progress Notes (Signed)
Cardiology Office Note   Date:  03/14/2016   ID:  [name], DOB 25-Apr-1946, MRN [id number]  PCP:  Henrine Screws, MD  Cardiologist:  Dr. Marlou Porch    Chief Complaint  Patient presents with  . Hypertension      History of Present Illness: [name] is a 70 y.o. male who presents for BP check and eval.  He last saw Dr. Marlou Porch 09/28/14.  He has a hx of nuclear stress test for his prior preoperative evaluation during a knee operation, right total knee, which demonstrated a very mild small in size inferior wall reversible defect mostly in the distal region. Overall low risk test. 58%. He did not have any ST segment changes. Blood pressure response was normal. He exercised for 6 minutes and 37 seconds. No anginal symptoms. He is continuing to promote primary prevention. Walking uphill [facility] or climbing stairs rapidly.  No real change in that.  He did have some recent chest pressure and discussed with Dr. Inda Merlin.  He changed the way he took prilosec with improvement in resolution of pressure.   PCP follows cholesterol, we will ask for labs.  Pt rides bike 5 days a week no pain.  He has chronic lower ext edema has been worked up.  Edema improved by next AM. This has been occuring since total knee replacements.   Past Medical History  Diagnosis Date  . Hypercholesteremia   . Nephrolithiasis   . Parkinson's disease [facility])      followed by Dr. Lezlie Octave at [facility] and [name], M.D. in [place]  . Hypertension   . Syncope   . Renal calculus   . Allergic rhinitis   . BPH (benign prostatic hyperplasia)   . Erectile dysfunction     Staxyn 10 mg or Viagra worked well. 3 samples of Cialis 20 mg provided  . Dysphagia, pharyngeal phase     GERD diagnosed on barium swallow. Has small hiatal hernia. Symptomatically somewhat better on omeprazole but not entirely. We'll try b.i.d. therapy  . Edema     1+ in both ankles, likely multifactorial including medication such as Requip  .  Presbycusis     and tinnitus - Dr. Izora Gala - August/2013  . Onychomycosis of toenail     April 27, 2013 - Dr. Inocencio Homes - podiatry, was in [place] - treating with oral Lamisil and topical nail therapy  . Diabetes ([place])   . GERD (gastroesophageal reflux disease)   . Arthritis     Past Surgical History  Procedure Laterality Date  . Hand surgery    . Knee surgery    . Total knee arthroplasty    . Joint replacement Bilateral   . Inguinal hernia repair Left 11/04/2015    Procedure: LEFT INGUINAL HERNIA REPAIR WITH MESH;  Surgeon: Armandina Gemma, MD;  Location: [place];  Service: General;  Laterality: Left;  . Insertion of mesh Left 11/04/2015    Procedure: INSERTION OF MESH;  Surgeon: Armandina Gemma, MD;  Location: [place];  Service: General;  Laterality: Left;     Current Outpatient Prescriptions  Medication Sig Dispense Refill  . amantadine (SYMMETREL) 100 MG capsule Take 100 mg by mouth 3 (three) times daily.     Marland Kitchen aspirin 81 MG tablet Take 81 mg by mouth 2 (two) times daily.     . carbidopa-levodopa (SINEMET IR) 25-100 MG per tablet Take 1 tablet by mouth 5 (five) times daily.    Marland Kitchen  cholecalciferol (VITAMIN D) 1000 UNITS tablet Take 1,000 Units by mouth daily.    Marland Kitchen glimepiride (AMARYL) 4 MG tablet Take 4 mg by mouth daily with breakfast.    . metFORMIN (GLUCOPHAGE) 500 MG tablet Take 500 mg by mouth 2 (two) times daily with a meal.    . metoprolol succinate (TOPROL-XL) 25 MG 24 hr tablet Take 1 tablet (25 mg total) by mouth daily. 90 tablet 3  . Multiple Vitamin (MULTIVITAMIN) tablet Take 1 tablet by mouth daily.    . Omega-3 Fatty Acids (FISH OIL PO) Take by mouth daily.    Marland Kitchen omeprazole (PRILOSEC) 20 MG capsule Take 20 mg by mouth 2 (two) times daily before a meal.     . rOPINIRole (REQUIP XL) 2 MG 24 hr tablet Take 2-6 mg by mouth 2 (two) times daily. 3 in the am and 1 at bedtime    . simvastatin (ZOCOR) 40 MG tablet Take 40 mg by mouth every evening.     . triamterene-hydrochlorothiazide (MAXZIDE-25) 37.5-25 MG per tablet Take 1 tablet by mouth daily.     No current facility-administered medications for this visit.    Allergies:   Oxycodone and Sudafed    Social History:  The patient  reports that he has never smoked. He does not have any smokeless tobacco history on file. He reports that he does not drink alcohol or use illicit drugs.   Family History:  The patient's family history includes Atrial fibrillation in his mother; Breast cancer in his mother; Emphysema in his father; Heart disease in his father.    ROS:  General:no colds or fevers, + weight decrease Skin:no rashes or ulcers HEENT:no blurred vision, no congestion CV:see HPI PUL:see HPI GI:no diarrhea constipation or melena, no indigestion GU:no hematuria, no dysuria MS:+ bil knee arthritic discomfort. joint pain, no claudication Neuro:no syncope, no lightheadedness Endo:+ diabetes stable, no thyroid disease  Wt Readings from Last 3 Encounters:  03/14/16 192 lb 9.6 oz (87.363 kg)  11/04/15 202 lb 6 oz (91.797 kg)  12/24/14 194 lb (87.998 kg)     PHYSICAL EXAM: VS:  BP 116/78 mmHg  Pulse 68  Ht 6' (1.829 m)  Wt 192 lb 9.6 oz (87.363 kg)  BMI 26.12 kg/m2 , BMI Body mass index is 26.12 kg/(m^2). General:Pleasant affect, NAD Skin:Warm and dry, brisk capillary refill HEENT:normocephalic, sclera clear, mucus membranes moist Neck:supple, no JVD, no bruits  Heart:S1S2 RRR without murmur, gallup, rub or click Lungs:clear without rales, rhonchi, or wheezes [id number], non tender, + BS, do not palpate liver spleen or masses Ext:1-2+ lower ext edema, 2+ pedal pulses, 2+ radial pulses Neuro:alert and oriented X 3, MAE, follows commands, + facial symmetry    EKG:  EKG is NOT ordered today. The ekg from 10/2015 was reviewed and no changes were noted.   Recent Labs: 11/01/2015: BUN 18; Creatinine, Ser 1.15; Potassium 5.0; Sodium 139    Lipid Panel No results found  for: CHOL, TRIG, HDL, CHOLHDL, VLDL, LDLCALC, LDLDIRECT     Other studies Reviewed: Additional studies/ records that were reviewed today include: previous OV ntoes..   ASSESSMENT AND PLAN:   1. Hyperlipidemia-continue with simvastatin. Promote and continuedietary modification, exercise. 2. Hx of Mildly abnormal cardiac stress test-continue to promote primary prevention, aggressive risk factor modification. Currently not having any symptoms. Doing very well. 3. Hypertension-excellent control. Dr. Inda Merlin. No changes made in medications. 4. 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 No syncope.  5. Parkinson's-[facility]. Relatively stable. Right arm tremor noted 6. Chest pressure  resolved after change in PPI, none with exercise- if returns he will call and will need cardiac stress test. 7. One year follow up with Dr. Marlou Porch  Current medicines are reviewed with the patient today.  The patient Has no concerns regarding medicines.  The following changes have been made:  See above Labs/ tests ordered today include:see above  Disposition:   FU:  see above  Signed, Cecilie Kicks, NP  03/14/2016 2:03 PM    [facility] [place], [place], [place], [name] Phone: [phone]; Fax: [phone]

## 2016-03-14 ENCOUNTER — Ambulatory Visit (INDEPENDENT_AMBULATORY_CARE_PROVIDER_SITE_OTHER): Payer: Medicare Other | Admitting: Cardiology

## 2016-03-14 ENCOUNTER — Encounter: Payer: Self-pay | Admitting: Cardiology

## 2016-03-14 VITALS — BP 116/78 | HR 68 | Ht 72.0 in | Wt 192.6 lb

## 2016-03-14 DIAGNOSIS — R9439 Abnormal result of other cardiovascular function study: Secondary | ICD-10-CM

## 2016-03-14 DIAGNOSIS — E7800 Pure hypercholesterolemia, unspecified: Secondary | ICD-10-CM | POA: Diagnosis not present

## 2016-03-14 DIAGNOSIS — I10 Essential (primary) hypertension: Secondary | ICD-10-CM | POA: Diagnosis not present

## 2016-03-14 MED ORDER — METOPROLOL SUCCINATE ER 25 MG PO TB24: 25.0000 mg | ORAL_TABLET | Freq: Every day | ORAL | Status: DC

## 2016-03-14 NOTE — Patient Instructions (Signed)
Medication Instructions:  A REFILL FOR TOPROL XL WAS SENT IN TODAY  Labwork: NONE  Testing/Procedures: NONE  Follow-Up: Your physician wants you to follow-up in: [place] will receive a reminder letter in the mail two months in advance. If you don't receive a letter, please call our office to schedule the follow-up appointment.   Any Other Special Instructions Will Be Listed Below (If Applicable). PER LAURA INGOLD, PAC IF YOUR CHEST PRESSURE RESUMES  WE COULD ORDER A STRESS TEST     If you need a refill on your cardiac medications before your next appointment, please call your pharmacy.

## 2016-05-29 ENCOUNTER — Ambulatory Visit: Payer: Medicare Other | Attending: Psychiatry

## 2016-05-29 DIAGNOSIS — R471 Dysarthria and anarthria: Secondary | ICD-10-CM

## 2016-05-29 NOTE — Patient Instructions (Signed)
Produce loud "ah" 5 times, twice a day

## 2016-05-29 NOTE — Therapy (Signed)
[facility] [address] [place], [name], [address] Phone: [phone]   Fax:  [phone]  Speech Language Pathology Treatment  Patient Details  [name] MRN: [id number] Date of Birth: [birth year] Referring Provider: Lezlie Octave, M.D.  Encounter Date: 05/29/2016      End of Session - 05/29/16 1009    Visit Number 1   Number of Visits 17   Date for SLP Re-Evaluation 08/03/16   SLP Start Time 0807   SLP Stop Time  0850   SLP Time Calculation (min) 43 min   Activity Tolerance Patient tolerated treatment well      Past Medical History:  Diagnosis Date  . Allergic rhinitis   . Arthritis   . BPH (benign prostatic hyperplasia)   . Diabetes ([place])   . Dysphagia, pharyngeal phase    GERD diagnosed on barium swallow. Has small hiatal hernia. Symptomatically somewhat better on omeprazole but not entirely. We'll try b.i.d. therapy  . Edema    1+ in both ankles, likely multifactorial including medication such as Requip  . Erectile dysfunction    Staxyn 10 mg or Viagra worked well. 3 samples of Cialis 20 mg provided  . GERD (gastroesophageal reflux disease)   . Hypercholesteremia   . Hypertension   . Nephrolithiasis   . Onychomycosis of toenail    April 27, 2013 - Dr. Inocencio Homes - podiatry, was in [place] - treating with oral Lamisil and topical nail therapy  . Parkinson's disease [facility])     followed by Dr. Lezlie Octave at [facility] and [name], M.D. in [place]  . Presbycusis    and tinnitus - Dr. Izora Gala - August/2013  . Renal calculus   . Syncope     Past Surgical History:  Procedure Laterality Date  . HAND SURGERY    . INGUINAL HERNIA REPAIR Left 11/04/2015   Procedure: LEFT INGUINAL HERNIA REPAIR WITH MESH;  Surgeon: Armandina Gemma, MD;  Location: [place];  Service: General;  Laterality: Left;  . INSERTION OF MESH Left 11/04/2015   Procedure: INSERTION OF MESH;  Surgeon: Armandina Gemma, MD;  Location: [place];  Service: General;  Laterality: Left;  . JOINT REPLACEMENT Bilateral   . KNEE SURGERY    . TOTAL KNEE ARTHROPLASTY      There were no vitals filed for this visit.      Subjective Assessment - 05/29/16 0824    Subjective "(Dr. Mervyn Skeeters) has written a prescription or two for this in the past but I decided to come in this time."   Currently in Pain? No/denies           SLP Evaluation [facility] - 05/29/16 [id number]      SLP Visit Information   SLP Received On 05/29/16   Referring Provider Lezlie Octave, M.D.   Onset Date 1998   Medical Diagnosis Parkinson's disease     General Information   HPI Pt diagnosed 8-9 years ago.      Prior Functional Status   Cognitive/Linguistic Baseline Within functional limits     Cognition   Overall Cognitive Status Within Functional Limits for tasks assessed     Auditory Comprehension   Overall Auditory Comprehension Appears within functional limits for tasks assessed     Verbal Expression   Overall Verbal Expression Appears within functional limits for tasks assessed     Oral Motor/Sensory Function   Overall Oral Motor/Sensory Function Impaired   Labial ROM Within Functional  Limits   Labial Symmetry Within Functional Limits   Labial Strength Within Functional Limits   Labial Coordination WFL   Lingual ROM Within Functional Limits   Lingual Symmetry Within Functional Limits   Lingual Strength Reduced Right   Lingual Sensation Within Functional Limits   Lingual Coordination Reduced  mild, in alternate motion tasks   Facial ROM Within Functional Limits   Velum Within Functional Limits     Motor Speech   Overall Motor Speech Impaired   Respiration Impaired   Level of Impairment Conversation   Phonation Hoarse;Low vocal intensity   Intelligibility Intelligible   Effective Techniques Increased vocal intensity  after loud /a/, volume for3 minutes 71dB with focus on loud     Pt's average  loudness in 6 minutes of simple to mod complex conversation was 67dB with sound level meter placed 30 cm away from pt's mouth. SLP had to coach pt with loud /a/ (necessitating 5-6 reps) using a graph and other visual and verbal cues for loudness but pt able to achieve average 93dB with this SLP A by the end of that time. Vocal intensity/volume improved after loud /a/ practice with an encouraged SLP focus on loudness/incr'd effort for speech, as described above (71dB).         SLP Education - 05/29/16 1008    Education provided Yes   Education Details loud /a/, benefits of PT, OT, ST, therapy course/goals   Person(s) Educated Patient   Methods Explanation   Comprehension Verbalized understanding          SLP Short Term Goals - 05/29/16 1011      SLP SHORT TERM GOAL #1   Title pt will maintain average volume 93dB with loud /a/ over three sessions   Time 4   Period Weeks   Status New     SLP SHORT TERM GOAL #2   Title pt will achieve loudness of 70dB with 90% of sentence responses   Time 4   Period Weeks   Status New     SLP SHORT TERM GOAL #3   Title pt will demo average 70dB loudness in 5 minutes simple conversation   Time 4   Period Weeks   Status New          SLP Long Term Goals - 05/29/16 1013      SLP LONG TERM GOAL #1   Title pt will demo loudness of >/= 70dB in 10 minutes mod complex conversation   Time 8   Period Weeks   Status New     SLP LONG TERM GOAL #2   Title using the telephone, pt will maintain loudness of 70dB or greater for four consecutive calls   Time 8   Period Weeks   Status New          Plan - 05/29/16 1009    Clinical Impression Statement Pt presents today with mild hypophonia primarily due to reduced breath support from his Parkinson's disease. Pt has seen negative impact of his softer speech at home with multiple family members and agrees ST would help him. He would benefit from skilled ST to address his ability to communicate  with family and community members.   Speech Therapy Frequency 2x / week   Duration --  8 weeks   Treatment/Interventions Compensatory strategies;Patient/family education;Functional tasks;Internal/external aids;SLP instruction and feedback;Environmental controls   Potential to Achieve Goals Good   Consulted and Agree with Plan of Care Patient      Patient will benefit  from skilled therapeutic intervention in order to improve the following deficits and impairments:   Dysarthria and anarthria      G-Codes - 2016-06-07 1014    Functional Assessment Tool Used noms -5 (25%)   Functional Limitations Motor speech   Motor Speech Current Status [id number]) At least 20 percent but less than 40 percent impaired, limited or restricted   Motor Speech Goal Status [id number]) At least 1 percent but less than 20 percent impaired, limited or restricted      Problem List Patient Active Problem List   Diagnosis Date Noted  . Reducible left inguinal hernia 11/03/2015  . Hypercholesteremia   . Nephrolithiasis   . Parkinson's disease ([place])   . Hypertension   . Syncope   . Renal calculus   . Diabetes ([place])   . Allergic rhinitis   . BPH (benign prostatic hyperplasia)   . Erectile dysfunction   . Dysphagia, pharyngeal phase   . Edema   . Presbycusis   . Onychomycosis of toenail   . Ureteral calculus 02/09/2013  . LEG PAIN 03/14/2010    [facility] ,MS, CCC-SLP  06-07-16, 10:18 AM  [facility] [address] [place], [name], [address] Phone: [phone]   Fax:  [phone]   [name] MRN: [id number] Date of Birth: Feb 28, 1946

## 2016-06-04 ENCOUNTER — Ambulatory Visit: Payer: Medicare Other

## 2016-06-04 DIAGNOSIS — R471 Dysarthria and anarthria: Secondary | ICD-10-CM

## 2016-06-04 NOTE — Therapy (Signed)
[facility] [address] [place], [name], [address] Phone: [phone]   Fax:  [phone]  Speech Language Pathology Treatment  Patient Details  [name] MRN: [id number] Date of Birth: 12-09-1945 Referring Provider: Lezlie Octave, M.D.  Encounter Date: 06/04/2016      End of Session - 06/04/16 1103    Visit Number 2   Number of Visits 17   Date for SLP Re-Evaluation 08/03/16   SLP Start Time 1017   SLP Stop Time  1100   SLP Time Calculation (min) 43 min   Activity Tolerance Patient tolerated treatment well      Past Medical History:  Diagnosis Date  . Allergic rhinitis   . Arthritis   . BPH (benign prostatic hyperplasia)   . Diabetes ([place])   . Dysphagia, pharyngeal phase    GERD diagnosed on barium swallow. Has small hiatal hernia. Symptomatically somewhat better on omeprazole but not entirely. We'll try b.i.d. therapy  . Edema    1+ in both ankles, likely multifactorial including medication such as Requip  . Erectile dysfunction    Staxyn 10 mg or Viagra worked well. 3 samples of Cialis 20 mg provided  . GERD (gastroesophageal reflux disease)   . Hypercholesteremia   . Hypertension   . Nephrolithiasis   . Onychomycosis of toenail    April 27, 2013 - Dr. Inocencio Homes - podiatry, was in [place] - treating with oral Lamisil and topical nail therapy  . Parkinson's disease [facility])     followed by Dr. Lezlie Octave at [facility] and [name], M.D. in [place]  . Presbycusis    and tinnitus - Dr. Izora Gala - August/2013  . Renal calculus   . Syncope     Past Surgical History:  Procedure Laterality Date  . HAND SURGERY    . INGUINAL HERNIA REPAIR Left 11/04/2015   Procedure: LEFT INGUINAL HERNIA REPAIR WITH MESH;  Surgeon: Armandina Gemma, MD;  Location: [place];  Service: General;  Laterality: Left;  . INSERTION OF MESH Left 11/04/2015   Procedure: INSERTION OF MESH;  Surgeon: Armandina Gemma, MD;  Location: [facility];  Service: General;  Laterality: Left;  . JOINT REPLACEMENT Bilateral   . KNEE SURGERY    . TOTAL KNEE ARTHROPLASTY      There were no vitals filed for this visit.      Subjective Assessment - 06/04/16 1023    Subjective "I finished a project yesterday."   Currently in Pain? No/denies               ADULT SLP TREATMENT - 06/04/16 1024      General Information   Behavior/Cognition Alert;Cooperative;Pleasant mood     Treatment Provided   Treatment provided Cognitive-Linquistic     Cognitive-Linquistic Treatment   Treatment focused on Dysarthria   Skilled Treatment SLP shaped pt's loud /a/ into speaking pitch as pt was screaming. Pt reported loud /a/ at effort level 8-9. In structured sentence tasks pt maintained 70dB 70% of the time with variable loudness. In conversation between stimuli pt maintained 70dB approx 30% of the time.      Assessment / Recommendations / Plan   Plan Continue with current plan of care     Progression Toward Goals   Progression toward goals Progressing toward goals          SLP Education - 06/04/16 1103    Education provided Yes   Education Details loud /a/  Person(s) Educated Patient   Methods Explanation;Demonstration;Verbal cues   Comprehension Verbalized understanding;Verbal cues required          SLP Short Term Goals - 06/04/16 1105      SLP SHORT TERM GOAL #1   Title pt will maintain average volume 93dB with loud /a/ over three sessions   Time 4   Period Weeks   Status On-going     SLP SHORT TERM GOAL #2   Title pt will achieve loudness of 70dB with 90% of sentence responses   Time 4   Period Weeks   Status On-going     SLP SHORT TERM GOAL #3   Title pt will demo average 70dB loudness in 5 minutes simple conversation   Time 4   Period Weeks   Status On-going          SLP Long Term Goals - 06/04/16 1105      SLP LONG TERM GOAL #1   Title pt will demo  loudness of >/= 70dB in 10 minutes mod complex conversation   Time 8   Period Weeks   Status On-going     SLP LONG TERM GOAL #2   Title using the telephone, pt will maintain loudness of 70dB or greater for four consecutive calls   Time 8   Period Weeks   Status On-going          Plan - 06/04/16 1104    Clinical Impression Statement Pt continues today with mild hypophonia primarily due to reduced breath support from his Parkinson's disease. SLP further shaped loud /a/  and targeted louder speech focusing on incr'd effort. He would cont to benefit from skilled ST to address his ability to communicate with family and community members.   Speech Therapy Frequency 2x / week   Duration --  8 weeks   Treatment/Interventions Compensatory strategies;Patient/family education;Functional tasks;Internal/external aids;SLP instruction and feedback;Environmental controls   Potential to Achieve Goals Good   Consulted and Agree with Plan of Care Patient      Patient will benefit from skilled therapeutic intervention in order to improve the following deficits and impairments:   Dysarthria and anarthria    Problem List Patient Active Problem List   Diagnosis Date Noted  . Reducible left inguinal hernia 11/03/2015  . Hypercholesteremia   . Nephrolithiasis   . Parkinson's disease ([place])   . Hypertension   . Syncope   . Renal calculus   . Diabetes ([place])   . Allergic rhinitis   . BPH (benign prostatic hyperplasia)   . Erectile dysfunction   . Dysphagia, pharyngeal phase   . Edema   . Presbycusis   . Onychomycosis of toenail   . Ureteral calculus 02/09/2013  . LEG PAIN 03/14/2010    [facility] ,MS, CCC-SLP  06/04/2016, 11:05 AM  [facility] [address] [place], [name], [address] Phone: [phone]   Fax:  [phone]   [name] MRN: [id number] Date of Birth: 02-21-1946

## 2016-06-04 NOTE — Patient Instructions (Signed)
  Please complete the assigned speech therapy homework 15 minutes whenever you do your loud "ah"s.

## 2016-06-07 ENCOUNTER — Ambulatory Visit: Payer: Medicare Other | Admitting: Speech Pathology

## 2016-06-07 DIAGNOSIS — R471 Dysarthria and anarthria: Secondary | ICD-10-CM | POA: Diagnosis not present

## 2016-06-07 NOTE — Therapy (Signed)
[facility] [address] [place], [name], [address] Phone: [phone]   Fax:  [phone]  Speech Language Pathology Treatment  Patient Details  [name] MRN: [id number] Date of Birth: [birth year] Referring Provider: Lezlie Octave, M.D.  Encounter Date: 06/07/2016      End of Session - 06/07/16 1445    Visit Number 3   Number of Visits 17   Date for SLP Re-Evaluation 08/03/16   SLP Start Time [id number]   SLP Stop Time  1444   SLP Time Calculation (min) 39 min      Past Medical History:  Diagnosis Date  . Allergic rhinitis   . Arthritis   . BPH (benign prostatic hyperplasia)   . Diabetes ([place])   . Dysphagia, pharyngeal phase    GERD diagnosed on barium swallow. Has small hiatal hernia. Symptomatically somewhat better on omeprazole but not entirely. We'll try b.i.d. therapy  . Edema    1+ in both ankles, likely multifactorial including medication such as Requip  . Erectile dysfunction    Staxyn 10 mg or Viagra worked well. 3 samples of Cialis 20 mg provided  . GERD (gastroesophageal reflux disease)   . Hypercholesteremia   . Hypertension   . Nephrolithiasis   . Onychomycosis of toenail    April 27, 2013 - Dr. Inocencio Homes - podiatry, was in [place] - treating with oral Lamisil and topical nail therapy  . Parkinson's disease [facility])     followed by Dr. Lezlie Octave at [facility] and [name], M.D. in [place]  . Presbycusis    and tinnitus - Dr. Izora Gala - August/2013  . Renal calculus   . Syncope     Past Surgical History:  Procedure Laterality Date  . HAND SURGERY    . INGUINAL HERNIA REPAIR Left 11/04/2015   Procedure: LEFT INGUINAL HERNIA REPAIR WITH MESH;  Surgeon: Armandina Gemma, MD;  Location: [facility];  Service: General;  Laterality: Left;  . INSERTION OF MESH Left 11/04/2015   Procedure: INSERTION OF MESH;  Surgeon: Armandina Gemma, MD;  Location: [facility];   Service: General;  Laterality: Left;  . JOINT REPLACEMENT Bilateral   . KNEE SURGERY    . TOTAL KNEE ARTHROPLASTY      There were no vitals filed for this visit.      Subjective Assessment - 06/07/16 1409    Subjective "I've been doing some shouting"               ADULT SLP TREATMENT - 06/07/16 1410      General Information   Behavior/Cognition Alert;Cooperative;Pleasant mood     Treatment Provided   Treatment provided Cognitive-Linquistic     Cognitive-Linquistic Treatment   Treatment focused on Dysarthria   Skilled Treatment Loud /a/ completed to recalibrate volume average of 95dB with appropriate pitch and supervision cues. Structured speech tasks (simple) with occasional min verbal cues for breath support and think shout and modeling.  Simple conversation over 8 minutes with average of 69dB  with usual cues for breath support and loud voice.      Assessment / Recommendations / Plan   Plan Continue with current plan of care     Progression Toward Goals   Progression toward goals Progressing toward goals          SLP Education - 06/07/16 1441    Education provided Yes   Education Details loud /a/, environmental compensations for dysarthria   Person(s) Educated  Patient   Methods Explanation;Demonstration;Verbal cues;Handout   Comprehension Verbalized understanding;Returned demonstration;Verbal cues required          SLP Short Term Goals - 06/07/16 1444      SLP SHORT TERM GOAL #1   Title pt will maintain average volume 93dB with loud /a/ over three sessions   Time 4   Period Weeks   Status On-going     SLP SHORT TERM GOAL #2   Title pt will achieve loudness of 70dB with 90% of sentence responses   Time 4   Period Weeks   Status On-going     SLP SHORT TERM GOAL #3   Title pt will demo average 70dB loudness in 5 minutes simple conversation   Time 4   Period Weeks   Status On-going          SLP Long Term Goals - 06/07/16 1445      SLP  LONG TERM GOAL #1   Title pt will demo loudness of >/= 70dB in 10 minutes mod complex conversation   Time 8   Period Weeks   Status On-going     SLP LONG TERM GOAL #2   Title using the telephone, pt will maintain loudness of 70dB or greater for four consecutive calls   Time 8   Period Weeks   Status On-going          Plan - 06/07/16 1444    Clinical Impression Statement Pt continues today with mild hypophonia primarily due to reduced breath support from his Parkinson's disease. SLP further shaped loud /a/  and targeted louder speech focusing on incr'd effort. He would cont to benefit from skilled ST to address his ability to communicate with family and community members.   Speech Therapy Frequency 2x / week   Treatment/Interventions Compensatory strategies;Patient/family education;Functional tasks;Internal/external aids;SLP instruction and feedback;Environmental controls   Potential to Achieve Goals Good   Consulted and Agree with Plan of Care Patient      Patient will benefit from skilled therapeutic intervention in order to improve the following deficits and impairments:   Dysarthria and anarthria    Problem List Patient Active Problem List   Diagnosis Date Noted  . Reducible left inguinal hernia 11/03/2015  . Hypercholesteremia   . Nephrolithiasis   . Parkinson's disease ([place])   . Hypertension   . Syncope   . Renal calculus   . Diabetes ([place])   . Allergic rhinitis   . BPH (benign prostatic hyperplasia)   . Erectile dysfunction   . Dysphagia, pharyngeal phase   . Edema   . Presbycusis   . Onychomycosis of toenail   . Ureteral calculus 02/09/2013  . LEG PAIN 03/14/2010    Kamy Poinsett, Annye Rusk MS, CCC-SLP 06/07/2016, 2:45 PM  [name], [name], [address] Phone: [phone]   Fax:  [phone]   [name] MRN: [id number] Date of Birth: Oct 23, 1945

## 2016-06-07 NOTE — Patient Instructions (Signed)
  Get the person's attention before speaking  Speak face to face in close proximity  Be aware of talking over background noise - turn off the TV, running water, appliances, fans, radio  When you are asked to repeat yourself - take a big breath to get loud  Feel like you are talking TOO LOUD!  After loud "AH"'s read aloud or recite loudly - the pledge, prayers, poems, nursery rhymes

## 2016-06-25 ENCOUNTER — Ambulatory Visit: Payer: Medicare Other | Attending: Psychiatry | Admitting: *Deleted

## 2016-06-25 DIAGNOSIS — R471 Dysarthria and anarthria: Secondary | ICD-10-CM | POA: Insufficient documentation

## 2016-06-27 ENCOUNTER — Ambulatory Visit: Payer: Medicare Other

## 2016-07-06 ENCOUNTER — Ambulatory Visit: Payer: Medicare Other

## 2016-07-11 ENCOUNTER — Ambulatory Visit: Payer: Medicare Other

## 2016-07-11 DIAGNOSIS — R471 Dysarthria and anarthria: Secondary | ICD-10-CM | POA: Diagnosis present

## 2016-07-11 NOTE — Patient Instructions (Signed)
  Please complete the assigned speech therapy homework and return it to your next session.  

## 2016-07-11 NOTE — Therapy (Signed)
[facility] [address] [place], [name], [address] Phone: [phone]   Fax:  [phone]  Speech Language Pathology Treatment  Patient Details  [name] MRN: [id number] Date of Birth: [birth year] Referring Provider: Lezlie Octave, M.D.  Encounter Date: 07/11/2016      End of Session - 07/11/16 1141    Visit Number 4   Number of Visits 17   Date for SLP Re-Evaluation 08/03/16   SLP Start Time 0807  pt checked in 0810, SLP out in lobby talking with pt 0807-0810, assessing loudness in waiting room/min noisy environment   SLP Stop Time  0846   SLP Time Calculation (min) 39 min   Activity Tolerance Patient tolerated treatment well      Past Medical History:  Diagnosis Date  . Allergic rhinitis   . Arthritis   . BPH (benign prostatic hyperplasia)   . Diabetes ([place])   . Dysphagia, pharyngeal phase    GERD diagnosed on barium swallow. Has small hiatal hernia. Symptomatically somewhat better on omeprazole but not entirely. We'll try b.i.d. therapy  . Edema    1+ in both ankles, likely multifactorial including medication such as Requip  . Erectile dysfunction    Staxyn 10 mg or Viagra worked well. 3 samples of Cialis 20 mg provided  . GERD (gastroesophageal reflux disease)   . Hypercholesteremia   . Hypertension   . Nephrolithiasis   . Onychomycosis of toenail    April 27, 2013 - Dr. Inocencio Homes - podiatry, was in [place] - treating with oral Lamisil and topical nail therapy  . Parkinson's disease [facility])     followed by Dr. Lezlie Octave at [facility] and [name], M.D. in [place]  . Presbycusis    and tinnitus - Dr. Izora Gala - August/2013  . Renal calculus   . Syncope     Past Surgical History:  Procedure Laterality Date  . HAND SURGERY    . INGUINAL HERNIA REPAIR Left 11/04/2015   Procedure: LEFT INGUINAL HERNIA REPAIR WITH MESH;  Surgeon: Armandina Gemma, MD;  Location: [facility];   Service: General;  Laterality: Left;  . INSERTION OF MESH Left 11/04/2015   Procedure: INSERTION OF MESH;  Surgeon: Armandina Gemma, MD;  Location: [facility];  Service: General;  Laterality: Left;  . JOINT REPLACEMENT Bilateral   . KNEE SURGERY    . TOTAL KNEE ARTHROPLASTY      There were no vitals filed for this visit.      Subjective Assessment - 07/11/16 1133    Subjective Pt reported not doing loud /a/ on vacation. "Fishing guys don't like "ahs"."   Currently in Pain? No/denies               ADULT SLP TREATMENT - 07/11/16 1134      General Information   Behavior/Cognition Alert;Cooperative;Pleasant mood     Treatment Provided   Treatment provided Cognitive-Linquistic     Cognitive-Linquistic Treatment   Treatment focused on Dysarthria   Skilled Treatment Loud /a/ was used to realibrate pt's loudness in conversation by objectifying sound and feel of max loudness. Average today was 92dB with rare min verbal cues for loudness. In sentences pt generated sentence out of three random words wiht average 69dB with occasional min verbal and visual cues for loudness. SLP ascertained that pt's loudness dropped when linguistic demand increased. Pt also noted his volume dropped and SLP told pt that this level was helpful for him  to practice as content/loudness need dichotomy was necessary in conversation. SLP also addressed with pt his feeling of talking too loudly, explaining pt had habitualized sub-WNL loudness in conversation so incr'd effort would feel like pt talking too loudly. SLP assured pt his loudness was infact WNL. Conversation (simple 4 minute) after sentences was average 70dB and SLP showed pt through this that practice will generate more habitual WNL volume speech. Last conversation (simple), pt's loudness decr'd after 8 minutes. Pt was unaware of this until SLP directed his attention to the decr in volume.     Assessment / Recommendations / Plan   Plan Continue  with current plan of care     Progression Toward Goals   Progression toward goals Progressing toward goals          SLP Education - 07/11/16 1141    Education provided Yes   Education Details pt is not in fact talking too loudly   Person(s) Educated Patient   Methods Explanation          SLP Short Term Goals - 07/11/16 1159      SLP SHORT TERM GOAL #1   Title pt will maintain average volume 93dB with loud /a/ over three sessions   Time 3   Period Weeks   Status On-going     SLP SHORT TERM GOAL #2   Title pt will achieve loudness of 70dB with 90% of sentence responses   Time 3   Period Weeks   Status On-going     SLP SHORT TERM GOAL #3   Title pt will demo average 70dB loudness in 5 minutes simple conversation   Time 3   Period Weeks   Status On-going          SLP Long Term Goals - 07/11/16 1201      SLP LONG TERM GOAL #1   Title pt will demo loudness of >/= 70dB in 10 minutes mod complex conversation   Time 7   Period Weeks   Status On-going     SLP LONG TERM GOAL #2   Title using the telephone, pt will maintain loudness of 70dB or greater for four consecutive calls   Time 7   Period Weeks   Status On-going          Plan - 07/11/16 1142    Clinical Impression Statement Pt continues today with mild hypophonia primarily due to reduced breath support from his Parkinson's disease. SLP further shaped loud /a/  and targeted louder speech focusing on incr'd effort. He would cont to benefit from skilled ST to address his ability to communicate with family and community members.   Speech Therapy Frequency 2x / week   Treatment/Interventions Compensatory strategies;Patient/family education;Functional tasks;Internal/external aids;SLP instruction and feedback;Environmental controls   Potential to Achieve Goals Good   Consulted and Agree with Plan of Care Patient      Patient will benefit from skilled therapeutic intervention in order to improve the following  deficits and impairments:   Dysarthria and anarthria    Problem List Patient Active Problem List   Diagnosis Date Noted  . Reducible left inguinal hernia 11/03/2015  . Hypercholesteremia   . Nephrolithiasis   . Parkinson's disease ([place])   . Hypertension   . Syncope   . Renal calculus   . Diabetes ([place])   . Allergic rhinitis   . BPH (benign prostatic hyperplasia)   . Erectile dysfunction   . Dysphagia, pharyngeal phase   . Edema   .  Presbycusis   . Onychomycosis of toenail   . Ureteral calculus 02/09/2013  . LEG PAIN 03/14/2010    [facility] ,[place], CCC-SLP  07/11/2016, 12:17 PM  [facility] [address] [place], [name], [address] Phone: [phone]   Fax:  [phone]   [name] MRN: [id number] Date of Birth: 1946-05-21

## 2016-07-13 ENCOUNTER — Ambulatory Visit: Payer: Medicare Other

## 2016-07-16 ENCOUNTER — Ambulatory Visit: Payer: Medicare Other | Attending: Psychiatry

## 2016-07-16 DIAGNOSIS — R471 Dysarthria and anarthria: Secondary | ICD-10-CM | POA: Insufficient documentation

## 2016-07-16 NOTE — Patient Instructions (Signed)
   Continue to do your loud 'ah" 5 times, twice per day. We will set you up for a screening when you are done with PT.

## 2016-07-16 NOTE — Therapy (Signed)
[facility] [address] [place], [name], [address] Phone: [phone]   Fax:  [phone]  Speech Language Pathology Treatment  Patient Details  [name] MRN: [id number] Date of Birth: 1945-11-24 Referring Provider: Lezlie Octave, M.D.  Encounter Date: 07/16/2016      End of Session - 07/16/16 1102    Visit Number 5   Number of Visits 17   Date for SLP Re-Evaluation 08/03/16   SLP Start Time 0938  8 minutes late   SLP Stop Time  1015   SLP Time Calculation (min) 37 min   Activity Tolerance Patient tolerated treatment well      Past Medical History:  Diagnosis Date  . Allergic rhinitis   . Arthritis   . BPH (benign prostatic hyperplasia)   . Diabetes ([place])   . Dysphagia, pharyngeal phase    GERD diagnosed on barium swallow. Has small hiatal hernia. Symptomatically somewhat better on omeprazole but not entirely. We'll try b.i.d. therapy  . Edema    1+ in both ankles, likely multifactorial including medication such as Requip  . Erectile dysfunction    Staxyn 10 mg or Viagra worked well. 3 samples of Cialis 20 mg provided  . GERD (gastroesophageal reflux disease)   . Hypercholesteremia   . Hypertension   . Nephrolithiasis   . Onychomycosis of toenail    April 27, 2013 - Dr. Inocencio Homes - podiatry, was in [place] - treating with oral Lamisil and topical nail therapy  . Parkinson's disease [facility])     followed by Dr. Lezlie Octave at [facility] and [name], M.D. in [place]  . Presbycusis    and tinnitus - Dr. Izora Gala - August/2013  . Renal calculus   . Syncope     Past Surgical History:  Procedure Laterality Date  . HAND SURGERY    . INGUINAL HERNIA REPAIR Left 11/04/2015   Procedure: LEFT INGUINAL HERNIA REPAIR WITH MESH;  Surgeon: Armandina Gemma, MD;  Location: [facility];  Service: General;  Laterality: Left;  . INSERTION OF MESH Left 11/04/2015   Procedure: INSERTION OF MESH;   Surgeon: Armandina Gemma, MD;  Location: [facility];  Service: General;  Laterality: Left;  . JOINT REPLACEMENT Bilateral   . KNEE SURGERY    . TOTAL KNEE ARTHROPLASTY      There were no vitals filed for this visit.      Subjective Assessment - 07/16/16 0947    Subjective Pt cancelled ST on Friday 07-13-16 due to property selling in [place].   Currently in Pain? No/denies               ADULT SLP TREATMENT - 07/16/16 0954      General Information   Behavior/Cognition Alert;Cooperative;Pleasant mood     Treatment Provided   Treatment provided Cognitive-Linquistic     Cognitive-Linquistic Treatment   Treatment focused on Dysarthria   Skilled Treatment Pt maintained speech at average 71dB in 21 minutes conversation.            SLP Short Term Goals - 07/11/16 1159      SLP SHORT TERM GOAL #1   Title pt will maintain average volume 93dB with loud /a/ over three sessions   Time 3   Period Weeks   Status On-going     SLP SHORT TERM GOAL #2   Title pt will achieve loudness of 70dB with 90% of sentence responses   Time 3   Period  Weeks   Status On-going     SLP SHORT TERM GOAL #3   Title pt will demo average 70dB loudness in 5 minutes simple conversation   Time 3   Period Weeks   Status On-going          SLP Long Term Goals - 07/11/16 1201      SLP LONG TERM GOAL #1   Title pt will demo loudness of >/= 70dB in 10 minutes mod complex conversation   Time 7   Period Weeks   Status On-going     SLP LONG TERM GOAL #2   Title using the telephone, pt will maintain loudness of 70dB or greater for four consecutive calls   Time 7   Period Weeks   Status On-going          Plan - 2016/07/18 1107    Clinical Impression Statement Pt spoke in >20 minutes conversation with loudness WNL. He is appropriate for d/c at this time, and pt agrees with this.   Treatment/Interventions Compensatory strategies;Patient/family education;Functional  tasks;Internal/external aids;SLP instruction and feedback;Environmental controls   Potential to Achieve Goals Good   Consulted and Agree with Plan of Care Patient      Patient will benefit from skilled therapeutic intervention in order to improve the following deficits and impairments:   Dysarthria and anarthria      G-Codes - July 18, 2016 1110    Functional Assessment Tool Used noms - 6   Functional Limitations Motor speech   Motor Speech Goal Status [phone]) At least 1 percent but less than 20 percent impaired, limited or restricted   Motor Speech Goal Status ([id number]) At least 1 percent but less than 20 percent impaired, limited or restricted     Scalp Level  Visits from Start of Care: 5  Current functional level related to goals / functional outcomes: See above for goal update. Pt's conversational loudness in 20 minutes mod complex conversation was average >70dB. He is agreeable to discharge even though his last LTG was not addressed. Pt reported he will continue to perform loud /a/.   Remaining deficits: None observed today.   Education / Equipment: Need to perform loud /a/ 6 of 7 days per week.  Plan: Patient agrees to discharge.  Patient goals were met. Patient is being discharged due to meeting the stated rehab goals.  ?????and being pleased with current progress.        Problem List Patient Active Problem List   Diagnosis Date Noted  . Reducible left inguinal hernia 11/03/2015  . Hypercholesteremia   . Nephrolithiasis   . Parkinson's disease ([place])   . Hypertension   . Syncope   . Renal calculus   . Diabetes ([place])   . Allergic rhinitis   . BPH (benign prostatic hyperplasia)   . Erectile dysfunction   . Dysphagia, pharyngeal phase   . Edema   . Presbycusis   . Onychomycosis of toenail   . Ureteral calculus 02/09/2013  . LEG PAIN 03/14/2010    [facility] ,MS, CCC-SLP  Jul 18, 2016, 11:12 AM  [facility] [address] [place], [name], [address] Phone: [phone]   Fax:  [phone]   [name] MRN: [id number] Date of Birth: Sep 01, 1946

## 2016-07-30 ENCOUNTER — Encounter: Payer: Medicare Other | Admitting: *Deleted

## 2017-01-24 ENCOUNTER — Ambulatory Visit: Payer: Medicare Other

## 2017-01-24 ENCOUNTER — Ambulatory Visit: Payer: Medicare Other | Admitting: Occupational Therapy

## 2017-01-24 ENCOUNTER — Ambulatory Visit: Payer: Medicare Other | Admitting: Physical Therapy

## 2017-04-18 ENCOUNTER — Other Ambulatory Visit: Payer: Self-pay | Admitting: Cardiology

## 2017-05-23 ENCOUNTER — Ambulatory Visit: Payer: Medicare Other | Attending: Internal Medicine | Admitting: Physical Therapy

## 2017-05-23 ENCOUNTER — Ambulatory Visit: Payer: Medicare Other

## 2017-05-23 ENCOUNTER — Ambulatory Visit: Payer: Medicare Other | Admitting: Occupational Therapy

## 2017-08-28 ENCOUNTER — Other Ambulatory Visit: Payer: Self-pay

## 2017-08-28 ENCOUNTER — Other Ambulatory Visit: Payer: Self-pay | Admitting: Cardiology

## 2017-08-28 MED ORDER — METOPROLOL SUCCINATE ER 25 MG PO TB24: 25.0000 mg | ORAL_TABLET | Freq: Every day | ORAL | 0 refills | Status: DC

## 2017-09-03 ENCOUNTER — Other Ambulatory Visit: Payer: Self-pay | Admitting: Internal Medicine

## 2017-09-03 ENCOUNTER — Ambulatory Visit
Admission: RE | Admit: 2017-09-03 | Discharge: 2017-09-03 | Disposition: A | Discharge status: Home / Self Care | Payer: Medicare Other | Source: Ambulatory Visit | Attending: Internal Medicine | Admitting: Internal Medicine

## 2017-09-03 DIAGNOSIS — M79671 Pain in right foot: Secondary | ICD-10-CM

## 2017-09-17 ENCOUNTER — Other Ambulatory Visit: Payer: Self-pay | Admitting: Cardiology

## 2017-10-03 ENCOUNTER — Ambulatory Visit: Payer: Medicare Other | Admitting: Podiatry

## 2017-10-03 ENCOUNTER — Encounter: Payer: Self-pay | Admitting: Podiatry

## 2017-10-03 ENCOUNTER — Ambulatory Visit (INDEPENDENT_AMBULATORY_CARE_PROVIDER_SITE_OTHER): Payer: Medicare Other

## 2017-10-03 ENCOUNTER — Other Ambulatory Visit: Payer: Self-pay | Admitting: Podiatry

## 2017-10-03 DIAGNOSIS — M779 Enthesopathy, unspecified: Secondary | ICD-10-CM

## 2017-10-03 DIAGNOSIS — M79671 Pain in right foot: Secondary | ICD-10-CM

## 2017-10-03 MED ORDER — TRIAMCINOLONE ACETONIDE 10 MG/ML IJ SUSP
10.0000 mg | Freq: Once | INTRAMUSCULAR | Status: AC
  Administered 2017-10-03: 10 mg

## 2017-10-03 NOTE — Progress Notes (Signed)
Subjective:   Patient ID: [name], male   DOB: 71 y.o.   MRN: [id number]   HPI Patient presents stating of had a lot of pain in my right forefoot with inflammation and fluid and is been going on now for several months with no history of injury   Review of Systems  All other systems reviewed and are negative.       Objective:  Physical Exam  Constitutional: He appears well-developed and well-nourished.  Cardiovascular: Intact distal pulses.  Pulmonary/Chest: Effort normal.  Musculoskeletal: Normal range of motion.  Neurological: He is alert.  Skin: Skin is warm.  Nursing note and vitals reviewed.   Neurovascular status intact muscle strength adequate range of motion within normal limits with patient found to have quite a bit of inflammation mostly centered around the second MPJ right with pain upon palpation.  Patient has good digital perfusion well oriented x3 with no other pathology     Assessment:  Probability for acute capsulitis second MPJ right     Plan:  H&P condition reviewed proximal nerve block administered aspirating the joint getting a small amount of clear fluid and injected with 1/4 cc dexamethasone Kenalog and advised on reduced activity.  Reappoint to recheck again in the next several weeks  X-rays indicate that there is no indication currently of advanced stress fracture with some changes second third metatarsal shaft but does not appear to be stress fracture and orientation

## 2017-10-03 NOTE — Progress Notes (Signed)
   Subjective:    Patient ID: [name], male    DOB: 11/23/1945, 71 y.o.   MRN: [id number]  HPI    Review of Systems  All other systems reviewed and are negative.      Objective:   Physical Exam        Assessment & Plan:

## 2017-10-21 ENCOUNTER — Other Ambulatory Visit: Payer: Self-pay | Admitting: Cardiology

## 2017-10-24 ENCOUNTER — Ambulatory Visit: Payer: Medicare Other | Admitting: Podiatry

## 2017-10-24 ENCOUNTER — Ambulatory Visit: Payer: Medicare Other | Admitting: Cardiology

## 2017-10-24 ENCOUNTER — Encounter: Payer: Self-pay | Admitting: Podiatry

## 2017-10-24 ENCOUNTER — Encounter: Payer: Self-pay | Admitting: Cardiology

## 2017-10-24 VITALS — BP 106/80 | HR 70 | Ht 72.0 in | Wt 198.8 lb

## 2017-10-24 DIAGNOSIS — M779 Enthesopathy, unspecified: Secondary | ICD-10-CM

## 2017-10-24 DIAGNOSIS — E7800 Pure hypercholesterolemia, unspecified: Secondary | ICD-10-CM

## 2017-10-24 DIAGNOSIS — I10 Essential (primary) hypertension: Secondary | ICD-10-CM | POA: Diagnosis not present

## 2017-10-24 DIAGNOSIS — I444 Left anterior fascicular block: Secondary | ICD-10-CM | POA: Diagnosis not present

## 2017-10-24 MED ORDER — DICLOFENAC SODIUM 75 MG PO TBEC
75.0000 mg | DELAYED_RELEASE_TABLET | Freq: Two times a day (BID) | ORAL | 2 refills | Status: DC
Start: 2017-10-24 — End: 2018-01-21

## 2017-10-24 MED ORDER — METOPROLOL SUCCINATE ER 25 MG PO TB24: 25.0000 mg | ORAL_TABLET | Freq: Every day | ORAL | 3 refills | Status: DC

## 2017-10-24 NOTE — Progress Notes (Signed)
Cardiology Office Note:    Date:  10/24/2017   ID:  [name], DOB 04-Nov-1945, MRN [id number]  PCP:  Josetta Huddle, MD  Cardiologist:  No primary care provider on file.   Referring MD: Josetta Huddle, MD     History of Present Illness:    [name] is a 72 y.o. male here for clinic follow-up, last being seen on 03/14/16 by Cecilie Kicks, NP.  He was seen by me last in 2015.  Previously had a nuclear stress test prior to knee operation which showed a very small in size inferior wall reversible defect, distal, EF 58, low risk.  Some chest pressure was improved with Prilosec, PPI.  Riding his bike 5 days a week.  Past Medical History:  Diagnosis Date  . Allergic rhinitis   . Arthritis   . BPH (benign prostatic hyperplasia)   . Diabetes ([place])   . Dysphagia, pharyngeal phase    GERD diagnosed on barium swallow. Has small hiatal hernia. Symptomatically somewhat better on omeprazole but not entirely. We'll try b.i.d. therapy  . Edema    1+ in both ankles, likely multifactorial including medication such as Requip  . Erectile dysfunction    Staxyn 10 mg or Viagra worked well. 3 samples of Cialis 20 mg provided  . GERD (gastroesophageal reflux disease)   . Hypercholesteremia   . Hypertension   . Nephrolithiasis   . Onychomycosis of toenail    April 27, 2013 - Dr. Inocencio Homes - podiatry, was in [place] - treating with oral Lamisil and topical nail therapy  . Parkinson's disease [facility])     followed by Dr. Lezlie Octave at [facility] and [name], M.D. in [place]  . Presbycusis    and tinnitus - Dr. Izora Gala - August/2013  . Renal calculus   . Syncope     Past Surgical History:  Procedure Laterality Date  . HAND SURGERY    . INGUINAL HERNIA REPAIR Left 11/04/2015   Procedure: LEFT INGUINAL HERNIA REPAIR WITH MESH;  Surgeon: Armandina Gemma, MD;  Location: [facility];  Service: General;  Laterality: Left;  . INSERTION OF MESH Left 11/04/2015   Procedure: INSERTION OF  MESH;  Surgeon: Armandina Gemma, MD;  Location: [facility];  Service: General;  Laterality: Left;  . JOINT REPLACEMENT Bilateral   . KNEE SURGERY    . TOTAL KNEE ARTHROPLASTY      Current Medications:  Meds reviewed   Allergies:   Oxycodone and Sudafed [pseudoephedrine hcl]   Social History   Socioeconomic History  . Marital status: Married    Spouse name: None  . Number of children: None  . Years of education: None  . Highest education level: None  Social Needs  . Financial resource strain: None  . Food insecurity - worry: None  . Food insecurity - inability: None  . Transportation needs - medical: None  . Transportation needs - non-medical: None  Occupational History  . None  Tobacco Use  . Smoking status: Never Smoker  . Smokeless tobacco: Never Used  Substance and Sexual Activity  . Alcohol use: No  . Drug use: No  . Sexual activity: None  Other Topics Concern  . None  Social History Narrative  . None     Family History: The patient's family history includes Atrial fibrillation in his mother; Breast cancer in his mother; Emphysema in his father; Heart disease in his father.  ROS:   Please see the history of  present illness.     All other systems reviewed and are negative.  EKGs/Labs/Other Studies Reviewed:    The following studies were reviewed today: Prior notes, labs, ecg   EKG:  EKG is ordered today.  The ekg ordered today demonstrates normal sinus rhythm 70 left axis deviation, nonspecific interventricular conduction delay QRS durations 122 ms.  Personally reviewed  Recent Labs: No results found for requested labs within last 8760 hours.  Recent Lipid Panel No results found for: CHOL, TRIG, HDL, CHOLHDL, VLDL, LDLCALC, LDLDIRECT  Physical Exam:    VS:  BP 106/80   Pulse 70   Ht 6' (1.829 m)   Wt 198 lb 12.8 oz (90.2 kg)   SpO2 96%   BMI 26.96 kg/m     Wt Readings from Last 3 Encounters:  10/24/17 198 lb 12.8 oz (90.2 kg)    03/14/16 192 lb 9.6 oz (87.4 kg)  11/04/15 202 lb 6 oz (91.8 kg)     GEN:  Well nourished, well developed in no acute distress HEENT: Normal NECK: No JVD; No carotid bruits LYMPHATICS: No lymphadenopathy CARDIAC: RRR, no murmurs, rubs, gallops RESPIRATORY:  Clear to auscultation without rales, wheezing or rhonchi  ABDOMEN: Soft, non-tender, non-distended MUSCULOSKELETAL:  No edema; No deformity  SKIN: Warm and dry NEUROLOGIC:  Alert and oriented x 3 PSYCHIATRIC:  Normal affect   ASSESSMENT:    1. Essential hypertension   2. Hypercholesteremia   3. LAFB (left anterior fascicular block)    PLAN:    In order of problems listed above:  Hyperlipidemia -Simvastatin, diet, exercise  Essential hypertension -Good control.  No dizziness.  No changes in medications  Left anterior fascicular block - Seen again on ECG, no changes.  No syncope.  Parkinson's disease -Right arm tremor, [facility].  Atypical chest pain/GERD - Previous resolution with proton pump inhibitor.  He did have a stress test with minimal inferior abnormality overall low risk with normal EF.  Overall doing well, no changes we will see him back in 2 years.   Medication Adjustments/Labs and Tests Ordered: Current medicines are reviewed at length with the patient today.  Concerns regarding medicines are outlined above.  Orders Placed This Encounter  Procedures  . EKG 12-Lead   Meds ordered this encounter  Medications  . metoprolol succinate (TOPROL-XL) 25 MG 24 hr tablet    Sig: Take 1 tablet (25 mg total) by mouth daily.    Dispense:  90 tablet    Refill:  3    Signed, Candee Furbish, MD  10/24/2017 11:50 AM    [place]

## 2017-10-24 NOTE — Patient Instructions (Signed)
Medication Instructions:  The current medical regimen is effective;  continue present plan and medications.  Follow-Up: Follow up in 2 years with Dr. Skains.  You will receive a letter in the mail 2 months before you are due.  Please call us when you receive this letter to schedule your follow up appointment.  If you need a refill on your cardiac medications before your next appointment, please call your pharmacy.  Thank you for choosing [facility]!!     

## 2017-10-24 NOTE — Progress Notes (Signed)
Subjective:   Patient ID: [name], male   DOB: 72 y.o.   MRN: [id number]   HPI Patient states improved but still having pain in the second joint of the right foot with ambulation and states that it is still a bother to him   ROS      Objective:  Physical Exam  Neurovascular status found to be intact with inflammation and discomfort of the second MPJ right that's improved but still sore upon deep palpation     Assessment:  Inflammatory capsulitis right that's improving but is still painful when pressed     Plan:  H&P and condition reviewed. At this point we will try padding to disperse weight along with rigid bottom shoes oral anti-inflammatories consisting of diclofenac 75 mg twice a day. Reappoint 3 weeks or if symptoms should indicate

## 2017-11-13 ENCOUNTER — Encounter: Payer: Self-pay | Admitting: Podiatry

## 2017-11-13 ENCOUNTER — Ambulatory Visit: Payer: Medicare Other | Admitting: Podiatry

## 2017-11-13 DIAGNOSIS — M79671 Pain in right foot: Secondary | ICD-10-CM | POA: Diagnosis not present

## 2017-11-13 DIAGNOSIS — M779 Enthesopathy, unspecified: Secondary | ICD-10-CM | POA: Diagnosis not present

## 2017-11-13 NOTE — Progress Notes (Signed)
Subjective:   Patient ID: [name], male   DOB: 72 y.o.   MRN: [id number]   HPI Patient presents stating the right foot is improved but it still is sore if he is on it too much or if he is too active.  States overall he is doing somewhat better   ROS      Objective:  Physical Exam  Neurovascular status intact with patient's second and third MPJ right still tender but improved from previous visit     Assessment:  Doing well after being treated for inflammatory capsulitis right with mild pain still noted     Plan:  Advised on continued rigid bottom shoes padding therapy and discussed possible injections orthotics depending on symptoms.  Reappoint 8 weeks to reevaluate

## 2018-01-21 ENCOUNTER — Other Ambulatory Visit: Payer: Self-pay | Admitting: Podiatry

## 2018-02-18 ENCOUNTER — Other Ambulatory Visit: Payer: Self-pay

## 2018-02-18 ENCOUNTER — Encounter

## 2018-02-18 ENCOUNTER — Encounter: Payer: Self-pay | Admitting: Podiatry

## 2018-02-18 ENCOUNTER — Ambulatory Visit: Payer: Medicare Other | Admitting: Podiatry

## 2018-02-18 DIAGNOSIS — M7751 Other enthesopathy of right foot: Secondary | ICD-10-CM

## 2018-02-18 DIAGNOSIS — M779 Enthesopathy, unspecified: Principal | ICD-10-CM

## 2018-02-18 DIAGNOSIS — Z0001 Encounter for general adult medical examination with abnormal findings: Secondary | ICD-10-CM | POA: Insufficient documentation

## 2018-02-18 DIAGNOSIS — R259 Unspecified abnormal involuntary movements: Secondary | ICD-10-CM

## 2018-02-18 DIAGNOSIS — N400 Enlarged prostate without lower urinary tract symptoms: Secondary | ICD-10-CM | POA: Insufficient documentation

## 2018-02-18 DIAGNOSIS — H9190 Unspecified hearing loss, unspecified ear: Secondary | ICD-10-CM | POA: Insufficient documentation

## 2018-02-18 DIAGNOSIS — Z79899 Other long term (current) drug therapy: Secondary | ICD-10-CM | POA: Insufficient documentation

## 2018-02-18 DIAGNOSIS — M778 Other enthesopathies, not elsewhere classified: Secondary | ICD-10-CM

## 2018-02-18 DIAGNOSIS — E1165 Type 2 diabetes mellitus with hyperglycemia: Secondary | ICD-10-CM | POA: Insufficient documentation

## 2018-02-18 HISTORY — DX: Unspecified abnormal involuntary movements: R25.9

## 2018-02-19 NOTE — Progress Notes (Signed)
He presents today as a parkinsonian patient with a chief complaint of pain to the second metatarsophalangeal joint of the right foot.  He states that he is been dealing with this since October and he is tired of dealing with it.  He states that his toe sits up and he has severe pain around the second metatarsophalangeal joint.  He relates trauma while hunting in [place] but could have resulted in a similar problem.  Objective: Vital signs stable alert and oriented x3.  Pulses are palpable moderate edema bilateral lower extremity most likely secondary to bilateral knee implants.  He also demonstrates normal sensorium deep tendon reflexes and normal muscle tone bilateral.  No tremors distally.  He has mild cocked up hammertoe deformity second right rigid PIPJ plan of flexion second right pain on palpation of the second metatarsal phalangeal joint and on end range of motion.  Assessment: Capsulitis of the second metatarsal phalangeal joint right foot.  Plan: Discussed etiology pathology conservative versus surgical therapies.  At this point we will perform an extra-articular injection surrounding the second metatarsal phalangeal joint with 10 mg of Kenalog to 1/2 mg of Marcaine after sterile Betadine skin prep.  He tolerated procedure well.  We will request that he see Liliane Channel to have a set of orthotics made with no metatarsal pad but it cut out beneath the second i.e. a pocket for the second metatarsal to sit in.  Patient understands that if this is to no avail then surgery is his next step.

## 2018-02-25 ENCOUNTER — Ambulatory Visit (INDEPENDENT_AMBULATORY_CARE_PROVIDER_SITE_OTHER): Payer: Medicare Other | Admitting: Orthotics

## 2018-02-25 DIAGNOSIS — M7750 Other enthesopathy of unspecified foot: Secondary | ICD-10-CM

## 2018-02-25 DIAGNOSIS — M79671 Pain in right foot: Secondary | ICD-10-CM

## 2018-02-25 DIAGNOSIS — M779 Enthesopathy, unspecified: Secondary | ICD-10-CM

## 2018-02-25 NOTE — Progress Notes (Signed)
Patient came into today to be cast for Custom Foot Orthotics. Upon recommendation of Dr. Milinda Pointer Patient presents with capsulitis 2nd MPJ (R) Goals are offload 2nd MPJ (R) Plan vendor [place]

## 2018-03-18 ENCOUNTER — Ambulatory Visit: Payer: Medicare Other | Admitting: Orthotics

## 2018-03-18 DIAGNOSIS — M779 Enthesopathy, unspecified: Secondary | ICD-10-CM

## 2018-03-18 DIAGNOSIS — M778 Other enthesopathies, not elsewhere classified: Secondary | ICD-10-CM

## 2018-03-18 NOTE — Progress Notes (Signed)
Patient came in today to pick up custom made foot orthotics.  The goals were accomplished and the patient reported no dissatisfaction with said orthotics.  Patient was advised of breakin period and how to report any issues. 

## 2018-04-22 ENCOUNTER — Ambulatory Visit (INDEPENDENT_AMBULATORY_CARE_PROVIDER_SITE_OTHER): Payer: Medicare Other

## 2018-04-22 ENCOUNTER — Encounter

## 2018-04-22 ENCOUNTER — Ambulatory Visit: Payer: Medicare Other | Admitting: Podiatry

## 2018-04-22 ENCOUNTER — Encounter: Payer: Self-pay | Admitting: Podiatry

## 2018-04-22 DIAGNOSIS — M779 Enthesopathy, unspecified: Secondary | ICD-10-CM

## 2018-04-22 DIAGNOSIS — M2041 Other hammer toe(s) (acquired), right foot: Secondary | ICD-10-CM | POA: Diagnosis not present

## 2018-04-22 NOTE — Progress Notes (Signed)
Subjective:   Patient ID: [name], male   DOB: 72 y.o.   MRN: [id number]   HPI Patient presents stating the top of the second toe is been irritated for the last couple months and I have tried to work questions around it that are not working   ROS      Objective:  Physical Exam  Neurovascular status intact with patient found to have a moderate rigid contracture digit to right with dorsal keratotic tissue formation that is irritated inflamed secondary to the structural position of the digit     Assessment:  Inflammation of the dorsal second toe right with position of the toe and rigid contracture complicating factor     Plan:  H&P condition reviewed and recommended padding with medication after reviewing x-rays with possibility for digital fusion but I educated the patient on today.  X-ray indicated moderate elevation second digit right with no indications of osteomyelitis or any kind of other pathological bone process except for the lifting of the toe

## 2018-12-18 ENCOUNTER — Other Ambulatory Visit: Payer: Self-pay | Admitting: Cardiology

## 2019-01-20 ENCOUNTER — Other Ambulatory Visit: Payer: Self-pay | Admitting: Cardiology

## 2019-01-22 ENCOUNTER — Other Ambulatory Visit: Payer: Self-pay | Admitting: Cardiology

## 2019-04-14 ENCOUNTER — Other Ambulatory Visit: Payer: Self-pay | Admitting: Cardiology

## 2019-06-23 ENCOUNTER — Other Ambulatory Visit: Payer: Self-pay

## 2019-06-23 DIAGNOSIS — Z20822 Contact with and (suspected) exposure to covid-19: Secondary | ICD-10-CM

## 2019-06-25 ENCOUNTER — Telehealth: Payer: Self-pay | Admitting: *Deleted

## 2019-06-25 LAB — NOVEL CORONAVIRUS, NAA: SARS-CoV-2, NAA: NOT DETECTED

## 2019-06-25 NOTE — Telephone Encounter (Signed)
Pt returned call for lab results of covid-19. Notified pt of negative results. Denies symptoms. Someone at his job was positive so every one got tested. No close contact.

## 2019-07-28 ENCOUNTER — Other Ambulatory Visit: Payer: Self-pay

## 2019-07-28 DIAGNOSIS — Z20822 Contact with and (suspected) exposure to covid-19: Secondary | ICD-10-CM

## 2019-07-30 LAB — NOVEL CORONAVIRUS, NAA: SARS-CoV-2, NAA: NOT DETECTED

## 2019-09-07 ENCOUNTER — Other Ambulatory Visit (HOSPITAL_COMMUNITY): Payer: Self-pay | Admitting: Orthopedic Surgery

## 2019-09-07 DIAGNOSIS — M7989 Other specified soft tissue disorders: Secondary | ICD-10-CM

## 2019-09-07 DIAGNOSIS — M79661 Pain in right lower leg: Secondary | ICD-10-CM

## 2019-09-08 ENCOUNTER — Ambulatory Visit (HOSPITAL_COMMUNITY)
Admission: RE | Admit: 2019-09-08 | Discharge: 2019-09-08 | Disposition: A | Discharge status: Home / Self Care | Payer: Medicare Other | Source: Ambulatory Visit | Attending: Cardiology | Admitting: Cardiology

## 2019-09-08 ENCOUNTER — Other Ambulatory Visit: Payer: Self-pay

## 2019-09-08 DIAGNOSIS — M7989 Other specified soft tissue disorders: Secondary | ICD-10-CM | POA: Insufficient documentation

## 2019-09-08 DIAGNOSIS — M79661 Pain in right lower leg: Secondary | ICD-10-CM | POA: Insufficient documentation

## 2019-11-14 ENCOUNTER — Ambulatory Visit: Payer: Medicare Other

## 2019-11-19 ENCOUNTER — Ambulatory Visit: Payer: Medicare Other

## 2019-12-09 ENCOUNTER — Ambulatory Visit: Payer: Medicare Other

## 2020-07-20 ENCOUNTER — Encounter: Payer: Self-pay | Admitting: Neurology

## 2020-08-03 NOTE — Progress Notes (Signed)
Assessment/Plan:   1.  Parkinsons Disease, dx 2010 with sx's back to 2008  -Patient looked a little underdosed today, but had not yet taken his 10 AM medication.  Therefore, I did not change his levodopa.  He will continue carbidopa/levodopa 25/100, 2 tablets at 6:30 AM/10 AM/2 PM/6 PM.  -I am going to slightly change his Requip XL from 6 mg daily to 4 mg twice per day.  He does have a wearing off effect of this and thinks that it helps the stutter steps/start hesitation.  I told him to try to use good Rx on this medication, as it looks like it will be much cheaper than what he is currently paying.  Coupon given.  -He is currently on amantadine, 100 mg 3 times per day.  I did not change this, but I may wean him off of this in the future.  He has been on this since diagnosis, presumably for some mild tremor.  At this stage of the game, I am not sure that he needs it, and he has no evidence of dyskinesia.  -Long talk with the patient regarding the importance of safe, cardiovascular exercise.  We discussed community exercise and physical therapy programs.  Patient information was given.  Now, he declines referral, but does state that he would like to try to start exercising on his own.  -We will monitor the nighttime cramping.  Could consider carbidopa/levodopa 50/200 CR at bedtime if that continues to become a bigger problem (currently 1 time every 10 days)  Subjective:   [name] was seen today in the movement disorders clinic for neurologic consultation at the request of Josetta Huddle, MD.  The consultation is for the evaluation of Parkinson's disease.  Patient previously under the care of Dr. Lezlie Octave (and previously Dr. Erling Cruz, although I do not have notes from him).  It does not appear that he has actually seen her since June, 2019 (over 2 years now).  Unfortunately, that note is unavailable through care everywhere.  The last note I have is from November, 2018.  However, [facility] has been  refilling his medications since his last visit and he has not had any other neuro care for the last 2 years.  Patient is a 74 year old right-handed male who was diagnosed with Parkinson's disease in 2010, with symptoms of right hand tremor back to 2008.  Patient was for started on Requip XL in 2011, with improvement of symptoms, although he did develop ankle edema and dose had to be decreased.  Rotigotine patch resulted in better control of tremor, but cost was an issue.  Current movement disorder medications: Carbidopa/levodopa 25/100, 2 tablets at 6:30 AM/2 tablets at 10 AM/2 at 2 PM/2 tablets at 6 PM Amantadine 1 tablet tid Requip XL, 6 mg daily   Specific Symptoms:  Tremor: Yes.  , occasionally in the R hand Family hx of similar:  No. Voice: weaker - last LSVT Loud several years ago Sleep: sleeps well  Vivid Dreams: some  Acting out dreams:  Some screaming Wet Pillows: No. Postural symptoms:  Yes.    Falls? Only with working in garden when stooped down Bradykinesia symptoms: shuffling gait, slow movements and difficulty getting out of a chair.  Reports that "stutter steps" are very frustrating and becoming more frequent for him. Loss of smell:  No. Loss of taste:  No. Urinary Incontinence:  No. Difficulty Swallowing:  No. Handwriting, micrographia: Yes.   Trouble with ADL's:  No.  Trouble  buttoning clothing: Yes.   Depression:  No. Memory changes:  "its fair" Hallucinations:  No.  visual distortions: No. N/V:  No. Lightheaded:  No.  Syncope: No. Diplopia:  No. Dyskinesia:  No. Prior exposure to reglan/antipsychotics: No.   Patient had MRI of the brain.  It was last performed in 2010 and was unremarkable.  PREVIOUS MEDICATIONS: neupro (helped but costly); requip; levodopa IR  ALLERGIES:   Allergies  Allergen Reactions  . Oxycodone Itching    This makes him itch  . Sudafed [Pseudoephedrine Hcl]     Problems urinating     CURRENT MEDICATIONS:  Current Outpatient  Medications  Medication Instructions  . amantadine (SYMMETREL) 100 mg, Oral, 3 times daily  . amoxicillin (AMOXIL) 500 MG capsule No dose, route, or frequency recorded.  Marland Kitchen aspirin 81 mg, 2 times daily  . carbidopa-levodopa (SINEMET IR) 25-100 MG tablet 2 tablets, Oral, 4 times daily  . cholecalciferol (VITAMIN D) 1,000 Units, Daily  . glimepiride (AMARYL) 4 mg, Oral, Daily with breakfast  . metFORMIN (GLUCOPHAGE) 1,000 mg, Oral, 2 times daily with meals  . Omega-3 Fatty Acids (FISH OIL PO) Daily  . predniSONE (DELTASONE) 5 mg, Oral, Daily with breakfast  . rOPINIRole (REQUIP XL) 4 mg, Oral, Daily at bedtime  . simvastatin (ZOCOR) 40 mg, Every evening  . tamsulosin (FLOMAX) 0.4 mg, Oral, Daily  . triamterene-hydrochlorothiazide (MAXZIDE-25) 37.5-25 MG per tablet 1 tablet, Daily    Objective:   VITALS:   Vitals:   08/08/20 1007  BP: (!) 150/79  Pulse: 68  SpO2: 99%  Weight: 189 lb (85.7 kg)  Height: 5' 9.5" (1.765 m)    GEN:  The patient appears stated age and is in NAD. HEENT:  Normocephalic, atraumatic.  The mucous membranes are moist. The superficial temporal arteries are without ropiness or tenderness. CV:  RRR Lungs:  CTAB Neck/HEME:  There are no carotid bruits bilaterally.  Neurological examination:  Orientation: The patient is alert and oriented x3.  Cranial nerves: There is good facial symmetry with facial hypomimia. Extraocular muscles are intact. The visual fields are full to confrontational testing. The speech is fluent and clear. Soft palate rises symmetrically and there is no tongue deviation. Hearing is intact to conversational tone. Sensation: Sensation is intact to light and pinprick throughout (facial, trunk, extremities). Vibration is intact at the bilateral big toe. There is no extinction with double simultaneous stimulation. There is no sensory dermatomal level identified. Motor: Strength is 5/5 in the bilateral upper and lower extremities.   Shoulder shrug  is equal and symmetric.  There is no pronator drift. Deep tendon reflexes: Deep tendon reflexes are 1/4 at the bilateral biceps, triceps, brachioradialis, 0-1 at the bilateral patella and achilles. Plantar responses are downgoing bilaterally.  Movement examination: Tone: There is mod increased tone in the RUE.  There is mild increased tone in the LUE.    Abnormal movements: rare tremor with distraction in the L thumb Coordination:  There is  decremation with RAM's, with any form of RAMS, including alternating supination and pronation of the forearm, hand opening and closing, finger taps, heel taps and toe taps, R>L Gait and Station: The patient has no difficulty arising out of a deep-seated chair without the use of the hands. The patient's stride length is decreased.  The patient has a negative pull test.     I have reviewed and interpreted the following labs independently Patient had lab work on July 13, 2020.  Hemoglobin A1c was 7.5.  White  blood cells 8.0, hemoglobin 15.3, hematocrit 44.4 and platelets 297.  Sodium was 136, potassium 4.8, chloride 101, CO2 29, BUN 24, creatinine 1.33, glucose 194, AST 16, ALT less than 3, B12 509, TSH 0.79  Total time spent on today's visit was 70 minutes, including both face-to-face time and nonface-to-face time.  Time included that spent on review of records (prior notes available to me/labs/imaging if pertinent), discussing treatment and goals, answering patient's questions and coordinating care.  Cc:  Josetta Huddle, MD

## 2020-08-08 ENCOUNTER — Ambulatory Visit: Payer: Medicare Other | Admitting: Neurology

## 2020-08-08 ENCOUNTER — Other Ambulatory Visit: Payer: Self-pay

## 2020-08-08 ENCOUNTER — Encounter: Payer: Self-pay | Admitting: Neurology

## 2020-08-08 VITALS — BP 150/79 | HR 68 | Ht 69.5 in | Wt 189.0 lb

## 2020-08-08 DIAGNOSIS — G20 Parkinson's disease: Secondary | ICD-10-CM

## 2020-08-08 MED ORDER — ROPINIROLE HCL ER 4 MG PO TB24: 4.0000 mg | ORAL_TABLET | Freq: Every day | ORAL | 1 refills | Status: DC

## 2020-08-08 NOTE — Patient Instructions (Addendum)
1.  Take requip XL, 4 mg twice per day 2.  I may stop the amantadine in the future but continue for now 3.  Continue carbidopa/levodopa 25/100 as you were previously 4.  Let us know if you want a referral to PT  The physicians and staff at [facility] are committed to providing excellent care. You may receive a survey requesting feedback about your experience at our office. We strive to receive "very good" responses to the survey questions. If you feel that your experience would prevent you from giving the office a "very good " response, please contact our office to try to remedy the situation. We may be reached at [phone]. Thank you for taking the time out of your busy day to complete the survey.

## 2020-08-15 ENCOUNTER — Encounter: Payer: Self-pay | Admitting: Cardiology

## 2020-08-15 ENCOUNTER — Other Ambulatory Visit: Payer: Self-pay

## 2020-08-15 ENCOUNTER — Ambulatory Visit: Payer: Medicare Other | Admitting: Cardiology

## 2020-08-15 VITALS — BP 122/68 | HR 64 | Ht 71.5 in | Wt 191.2 lb

## 2020-08-15 DIAGNOSIS — G20 Parkinson's disease: Secondary | ICD-10-CM

## 2020-08-15 DIAGNOSIS — E119 Type 2 diabetes mellitus without complications: Secondary | ICD-10-CM | POA: Diagnosis not present

## 2020-08-15 DIAGNOSIS — I10 Essential (primary) hypertension: Secondary | ICD-10-CM

## 2020-08-15 DIAGNOSIS — E7800 Pure hypercholesterolemia, unspecified: Secondary | ICD-10-CM | POA: Diagnosis not present

## 2020-08-15 DIAGNOSIS — I444 Left anterior fascicular block: Secondary | ICD-10-CM

## 2020-08-15 NOTE — Progress Notes (Signed)
Cardiology Office Note:    Date:  08/15/2020   ID:  [name], DOB 07-18-1946, MRN [id number]  PCP:  Josetta Huddle, MD  [facility] Cardiologist:  No primary care provider on file.  [facility] Electrophysiologist:  None   Referring MD: Josetta Huddle, MD     History of Present Illness:    [name] is a 74 y.o. male here for the follow-up of abnormal EKG, hyperlipidemia, hypertension.  Overall been doing quite well.  He has Parkinson's, followed by neurology.  Medications reviewed as above.  No side effects.  No fevers chills nausea vomiting syncope bleeding.  No chest pain.  Past Medical History:  Diagnosis Date   Allergic rhinitis    Arthritis    BPH (benign prostatic hyperplasia)    Diabetes ([place])    Dysphagia, pharyngeal phase    GERD diagnosed on barium swallow. Has small hiatal hernia. Symptomatically somewhat better on omeprazole but not entirely. We'll try b.i.d. therapy   Edema    1+ in both ankles, likely multifactorial including medication such as Requip   Erectile dysfunction    Staxyn 10 mg or Viagra worked well. 3 samples of Cialis 20 mg provided   GERD (gastroesophageal reflux disease)    Hypercholesteremia    Hypertension    Nephrolithiasis    Onychomycosis of toenail    April 27, 2013 - Dr. Inocencio Homes - podiatry, was in [place] - treating with oral Lamisil and topical nail therapy   Parkinson's disease [facility])     followed by Dr. Lezlie Octave at [facility] and [name], M.D. in [place]   Presbycusis    and tinnitus - Dr. Izora Gala - August/2013   Renal calculus    Syncope     Past Surgical History:  Procedure Laterality Date   HAND SURGERY     INGUINAL HERNIA REPAIR Left 11/04/2015   Procedure: LEFT INGUINAL HERNIA REPAIR WITH MESH;  Surgeon: Armandina Gemma, MD;  Location: [place];  Service: General;  Laterality: Left;   INSERTION OF MESH Left 11/04/2015   Procedure: INSERTION OF MESH;  Surgeon: Armandina Gemma,  MD;  Location: [place];  Service: General;  Laterality: Left;   JOINT REPLACEMENT Bilateral    KNEE SURGERY     TOTAL KNEE ARTHROPLASTY      Current Medications: Current Meds  Medication Sig   amantadine (SYMMETREL) 100 MG capsule Take 100 mg by mouth 3 (three) times daily.    amoxicillin (AMOXIL) 500 MG capsule    aspirin 81 MG tablet Take 81 mg by mouth 2 (two) times daily.    carbidopa-levodopa (SINEMET IR) 25-100 MG tablet Take 2 tablets by mouth in the morning, at noon, in the evening, and at bedtime.    cholecalciferol (VITAMIN D) 1000 UNITS tablet Take 1,000 Units by mouth daily.   glimepiride (AMARYL) 4 MG tablet Take 4 mg by mouth daily with breakfast.   metFORMIN (GLUCOPHAGE) 1000 MG tablet Take 1,000 mg by mouth 2 (two) times daily with a meal.    Omega-3 Fatty Acids (FISH OIL PO) Take by mouth daily.   omeprazole (PRILOSEC) 20 MG capsule Take by mouth.   predniSONE (DELTASONE) 5 MG tablet Take 5 mg by mouth daily with breakfast.   rOPINIRole (REQUIP XL) 4 MG 24 hr tablet Take 1 tablet (4 mg total) by mouth at bedtime.   Saw Palmetto 500 MG CAPS Take by mouth.   simvastatin (ZOCOR) 40 MG tablet Take 40  mg by mouth every evening.   tamsulosin (FLOMAX) 0.4 MG CAPS capsule Take 0.4 mg by mouth daily.    triamterene-hydrochlorothiazide (MAXZIDE-25) 37.5-25 MG per tablet Take 1 tablet by mouth daily.     Allergies:   Oxycodone and Sudafed [pseudoephedrine hcl]   Social History   Socioeconomic History   Marital status: Married    Spouse name: Not on file   Number of children: Not on file   Years of education: Not on file   Highest education level: Not on file  Occupational History   Not on file  Tobacco Use   Smoking status: Never Smoker   Smokeless tobacco: Never Used  Substance and Sexual Activity   Alcohol use: No   Drug use: No   Sexual activity: Not on file  Other Topics Concern   Not on file  Social History  Narrative   Not on file   Social Determinants of Health   Financial Resource Strain:    Difficulty of Paying Living Expenses: Not on file  Food Insecurity:    Worried About [place] in the Last Year: Not on file   Ran Out of Food in the Last Year: Not on file  Transportation Needs:    Lack of Transportation (Medical): Not on file   Lack of Transportation (Non-Medical): Not on file  Physical Activity:    Days of Exercise per Week: Not on file   Minutes of Exercise per Session: Not on file  Stress:    Feeling of Stress : Not on file  Social Connections:    Frequency of Communication with Friends and Family: Not on file   Frequency of Social Gatherings with Friends and Family: Not on file   Attends Religious Services: Not on file   Active Member of Clubs or Organizations: Not on file   Attends Archivist Meetings: Not on file   Marital Status: Not on file     Family History: The patient's family history includes Atrial fibrillation in his mother; Breast cancer in his mother; Cancer in his brother; Emphysema in his father; Heart disease in his father.  ROS:   Please see the history of present illness.     All other systems reviewed and are negative.  EKGs/Labs/Other Studies Reviewed:     EKG:  EKG is  ordered today.  The ekg ordered today demonstrates sinus rhythm left anterior fascicular block, nonspecific interventricular conduction delay with QRS duration of 126 ms PAC noted  Recent Labs: No results found for requested labs within last 8760 hours.  Recent Lipid Panel No results found for: CHOL, TRIG, HDL, CHOLHDL, VLDL, LDLCALC, LDLDIRECT   Risk Assessment/Calculations:       Physical Exam:    VS:  BP 122/68    Pulse 64    Ht 5' 11.5" (1.816 m)    Wt 191 lb 3.2 oz (86.7 kg)    SpO2 95%    BMI 26.30 kg/m     Wt Readings from Last 3 Encounters:  08/15/20 191 lb 3.2 oz (86.7 kg)  08/08/20 189 lb (85.7 kg)  10/24/17 198 lb 12.8  oz (90.2 kg)     GEN:  Well nourished, well developed in no acute distress HEENT: Normal NECK: No JVD; No carotid bruits LYMPHATICS: No lymphadenopathy CARDIAC: RRR, no murmurs, rubs, gallops, rare ectopy RESPIRATORY:  Clear to auscultation without rales, wheezing or rhonchi  ABDOMEN: Soft, non-tender, non-distended MUSCULOSKELETAL:  No edema; No deformity  SKIN: Warm and dry  NEUROLOGIC:  Alert and oriented x 3 PSYCHIATRIC:  Normal affect   ASSESSMENT:    1. Hypercholesteremia   2. Primary hypertension   3. LAFB (left anterior fascicular block)   4. Diabetes mellitus with coincident hypertension ([place])   5. 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  No syncope, no high risk symptoms that would require pacemaker at this time.  Continue to monitor.  Try to avoid excessive AV nodal blocking agents such as beta-blockers or calcium channel blockers. -- Been off of metoprolol for a while.  I am glad.  He does have the interventricular conduction delay as well as left anterior fascicular block.  Hyperlipidemia -Currently on simvastatin 40 mg a day.  Doing well with this.  No side effects no myalgias.  His LDL cholesterol was 73 in September 2021 from outside labs.  Hemoglobin A1c 7.5 elevated compatible with type 2 diabetes.  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  Potassium 4.8.    Parkinson's disease -Right arm tremor noted.  Medications such as Sinemet amantadine Requip. Now seeing Dr. Carles Collet.   Essential hypertension with diabetes -Currently on triamterene hydrochlorothiazide.  Well-controlled.  On Metformin.  Prior atypical chest pain/GERD - Previous low risk nuclear stress test reviewed.   Medication Adjustments/Labs and Tests Ordered: Current medicines are reviewed at length with the patient today.   Concerns regarding medicines are outlined above.  Orders Placed This Encounter  Procedures   EKG 12-Lead   No orders of the defined types were placed in this encounter.   Patient Instructions  Medication Instructions:  Your physician recommends that you continue on your current medications as directed. Please refer to the Current Medication list given to you today.  *If you need a refill on your cardiac medications before your next appointment, please call your pharmacy*   Lab Work: None ordered  If you have labs (blood work) drawn today and your tests are completely normal, you will receive your results only by:  [place] (if you have MyChart) OR  A paper copy in the mail If you have any lab test that is abnormal or we need to change your treatment, we will call you to review the results.   Testing/Procedures: None ordered   Follow-Up: At [facility], you and your health needs are our priority.  As part of our continuing mission to provide you with exceptional heart care, we have created designated Provider Care Teams.  These Care Teams include your primary Cardiologist (physician) and Advanced Practice Providers (APPs -  Physician Assistants and Nurse Practitioners) who all work together to provide you with the care you need, when you need it.  We recommend signing up for the patient portal called "MyChart".  Sign up information is provided on this After Visit Summary.  MyChart is used to connect with patients for Virtual Visits (Telemedicine).  Patients are able to view lab/test results, encounter notes, upcoming appointments, etc.  Non-urgent messages can be sent to your provider as well.   To learn more about what you can do with MyChart, go to [email or web address].    Your next appointment:   12 month(s)  The format for your next appointment:   In Person  Provider:   You may see No primary care provider on file. or one of the following Advanced Practice  Providers on your designated Care  Team:    Truitt Merle, NP  Cecilie Kicks, NP  Kathyrn Drown, NP    Other Instructions      Signed, Candee Furbish, MD  08/15/2020 8:48 AM    [place]

## 2020-08-15 NOTE — Patient Instructions (Signed)
Medication Instructions:  Your physician recommends that you continue on your current medications as directed. Please refer to the Current Medication list given to you today.  *If you need a refill on your cardiac medications before your next appointment, please call your pharmacy*   Lab Work: None ordered  If you have labs (blood work) drawn today and your tests are completely normal, you will receive your results only by:  [place] (if you have MyChart) OR  A paper copy in the mail If you have any lab test that is abnormal or we need to change your treatment, we will call you to review the results.   Testing/Procedures: None ordered   Follow-Up: At [facility], you and your health needs are our priority.  As part of our continuing mission to provide you with exceptional heart care, we have created designated Provider Care Teams.  These Care Teams include your primary Cardiologist (physician) and Advanced Practice Providers (APPs -  Physician Assistants and Nurse Practitioners) who all work together to provide you with the care you need, when you need it.  We recommend signing up for the patient portal called "MyChart".  Sign up information is provided on this After Visit Summary.  MyChart is used to connect with patients for Virtual Visits (Telemedicine).  Patients are able to view lab/test results, encounter notes, upcoming appointments, etc.  Non-urgent messages can be sent to your provider as well.   To learn more about what you can do with MyChart, go to [email or web address].    Your next appointment:   12 month(s)  The format for your next appointment:   In Person  Provider:   You may see No primary care provider on file. or one of the following Advanced Practice Providers on your designated Care Team:    Truitt Merle, NP  Cecilie Kicks, NP  Kathyrn Drown, NP    Other Instructions

## 2020-08-22 MED ORDER — ROPINIROLE HCL ER 4 MG PO TB24: 4.0000 mg | ORAL_TABLET | Freq: Two times a day (BID) | ORAL | 1 refills | Status: DC

## 2020-10-22 DIAGNOSIS — E7800 Pure hypercholesterolemia, unspecified: Secondary | ICD-10-CM | POA: Diagnosis not present

## 2020-10-22 DIAGNOSIS — N183 Chronic kidney disease, stage 3 unspecified: Secondary | ICD-10-CM | POA: Diagnosis not present

## 2020-10-22 DIAGNOSIS — I10 Essential (primary) hypertension: Secondary | ICD-10-CM | POA: Diagnosis not present

## 2020-10-22 DIAGNOSIS — E119 Type 2 diabetes mellitus without complications: Secondary | ICD-10-CM | POA: Diagnosis not present

## 2020-10-22 DIAGNOSIS — K219 Gastro-esophageal reflux disease without esophagitis: Secondary | ICD-10-CM | POA: Diagnosis not present

## 2020-10-22 DIAGNOSIS — E139 Other specified diabetes mellitus without complications: Secondary | ICD-10-CM | POA: Diagnosis not present

## 2020-10-22 DIAGNOSIS — E785 Hyperlipidemia, unspecified: Secondary | ICD-10-CM | POA: Diagnosis not present

## 2020-10-22 DIAGNOSIS — E1165 Type 2 diabetes mellitus with hyperglycemia: Secondary | ICD-10-CM | POA: Diagnosis not present

## 2020-10-22 DIAGNOSIS — G20 Parkinson's disease: Secondary | ICD-10-CM | POA: Diagnosis not present

## 2020-11-09 ENCOUNTER — Telehealth (HOSPITAL_COMMUNITY): Payer: Self-pay | Admitting: Family

## 2020-11-09 NOTE — Telephone Encounter (Signed)
Called to discuss with patient about COVID-19 symptoms and the use of one of the available treatments for those with mild to moderate Covid symptoms and at a high risk of hospitalization.  Pt appears to qualify for outpatient treatment due to co-morbid conditions and/or a member of an at-risk group in accordance with the FDA Emergency Use Authorization.    Unable to reach pt - VM left  [name]   

## 2020-11-14 MED ORDER — ROPINIROLE HCL 3 MG PO TABS: 3.0000 mg | ORAL_TABLET | Freq: Three times a day (TID) | ORAL | 1 refills | Status: DC

## 2020-11-25 ENCOUNTER — Other Ambulatory Visit: Payer: Self-pay | Admitting: Internal Medicine

## 2020-11-25 ENCOUNTER — Ambulatory Visit
Admission: RE | Admit: 2020-11-25 | Discharge: 2020-11-25 | Disposition: A | Discharge status: Home / Self Care | Payer: Medicare Other | Source: Ambulatory Visit | Attending: Internal Medicine | Admitting: Internal Medicine

## 2020-11-25 DIAGNOSIS — R059 Cough, unspecified: Secondary | ICD-10-CM | POA: Diagnosis not present

## 2020-12-06 DIAGNOSIS — E119 Type 2 diabetes mellitus without complications: Secondary | ICD-10-CM | POA: Diagnosis not present

## 2020-12-06 DIAGNOSIS — N183 Chronic kidney disease, stage 3 unspecified: Secondary | ICD-10-CM | POA: Diagnosis not present

## 2020-12-06 DIAGNOSIS — E139 Other specified diabetes mellitus without complications: Secondary | ICD-10-CM | POA: Diagnosis not present

## 2020-12-06 DIAGNOSIS — E785 Hyperlipidemia, unspecified: Secondary | ICD-10-CM | POA: Diagnosis not present

## 2020-12-06 DIAGNOSIS — I10 Essential (primary) hypertension: Secondary | ICD-10-CM | POA: Diagnosis not present

## 2020-12-06 DIAGNOSIS — G20 Parkinson's disease: Secondary | ICD-10-CM | POA: Diagnosis not present

## 2020-12-06 DIAGNOSIS — E1165 Type 2 diabetes mellitus with hyperglycemia: Secondary | ICD-10-CM | POA: Diagnosis not present

## 2020-12-06 DIAGNOSIS — K219 Gastro-esophageal reflux disease without esophagitis: Secondary | ICD-10-CM | POA: Diagnosis not present

## 2020-12-29 DIAGNOSIS — G20 Parkinson's disease: Secondary | ICD-10-CM | POA: Diagnosis not present

## 2021-01-11 DIAGNOSIS — E119 Type 2 diabetes mellitus without complications: Secondary | ICD-10-CM | POA: Diagnosis not present

## 2021-01-11 DIAGNOSIS — N183 Chronic kidney disease, stage 3 unspecified: Secondary | ICD-10-CM | POA: Diagnosis not present

## 2021-01-11 DIAGNOSIS — I10 Essential (primary) hypertension: Secondary | ICD-10-CM | POA: Diagnosis not present

## 2021-01-11 DIAGNOSIS — E785 Hyperlipidemia, unspecified: Secondary | ICD-10-CM | POA: Diagnosis not present

## 2021-01-11 DIAGNOSIS — E7800 Pure hypercholesterolemia, unspecified: Secondary | ICD-10-CM | POA: Diagnosis not present

## 2021-01-11 DIAGNOSIS — E1165 Type 2 diabetes mellitus with hyperglycemia: Secondary | ICD-10-CM | POA: Diagnosis not present

## 2021-01-11 DIAGNOSIS — K219 Gastro-esophageal reflux disease without esophagitis: Secondary | ICD-10-CM | POA: Diagnosis not present

## 2021-01-11 DIAGNOSIS — G20 Parkinson's disease: Secondary | ICD-10-CM | POA: Diagnosis not present

## 2021-01-21 ENCOUNTER — Other Ambulatory Visit: Payer: Self-pay | Admitting: Neurology

## 2021-01-23 NOTE — Telephone Encounter (Signed)
Rx(s) sent to pharmacy electronically.  

## 2021-01-26 ENCOUNTER — Encounter: Payer: Self-pay | Admitting: Internal Medicine

## 2021-02-03 NOTE — Progress Notes (Signed)
Assessment/Plan:   1.  Parkinsons Disease, diagnosed 2010 with symptoms to 2008  -Increase carbidopa/levodopa 25/100, 3 tablets 3 times per day (from 2 po qid).  Will let me know if has trouble spacing out that far  -Decrease ropinirole 3 mg, 1/1/0.5  -Continue amantadine, 100 mg 3 times per day  -Long discussion with the patient today.  He was at [facility] for his care for quite some time and then came here, after not being seen at [facility] for over 2 years.  He then returned back to [facility] under a different provider.  Discussed with the patient that he does not need multiple different movement specialists, and actually that is probably not good for his care.  He agrees and would like to stay locally  -had him meet our LCSW today.   Subjective:   [name] was seen today in follow up for Parkinsons disease.  My previous records were reviewed prior to todays visit as well as outside records available to me. Pt denies falls but he states balance is an issue.  He rides stationary bike 3-4 times/week.  Pt denies lightheadedness, near syncope.  No hallucinations.  Mood has been fair.  Since our last visit, the patient did return to [facility] neurology (previously seen by Dr. Mervyn Skeeters).  He saw Dr. Lucile Crater.  When he saw him, the patient was on Requip, 4 mg/3 mg / 3 mg (not the dose prescribed by me), and Dr. Lucile Crater reduced his dosage to 3 mg 3 times per day.  He also changed his levodopa dose.  Patient told him that he was on carbidopa/levodopa 25/100, 3/2/2 (also not the dose that we left him on) and Dr. Lucile Crater changed it to carbidopa/levodopa 25/100, 3 tablets/3 tablets / 2 tablets.  Current prescribed movement disorder medications: Carbidopa/levodopa 25/100, 2 tablets at 6:30 AM/2 tablets at 10 AM/2 at 2 PM/2 tablets at 6 PM (changed by [facility] to carbidopa/levodopa 25/100, 3/3/2 but pt states today he is taking 2 po qid Amantadine 1 tablet tid (on it for tremor and not dyskinesia) requip 3 mg tid (changed from requip  XL 6 mg q day last visit),  And then the patient had apparently increased it to 4/3/3 per records at [facility] and then [facility] decreased it back to 3 mg 3 times per day   PREVIOUS MEDICATIONS: neupro (helped but costly); requip; levodopa IR  ALLERGIES:   Allergies  Allergen Reactions  . Oxycodone Itching    This makes him itch  . Sudafed [Pseudoephedrine Hcl]     Problems urinating     CURRENT MEDICATIONS:  Outpatient Encounter Medications as of 02/07/2021  Medication Sig  . amantadine (SYMMETREL) 100 MG capsule Take 100 mg by mouth 3 (three) times daily.   Marland Kitchen amoxicillin (AMOXIL) 500 MG capsule   . aspirin 81 MG tablet Take 81 mg by mouth 2 (two) times daily.   . carbidopa-levodopa (SINEMET IR) 25-100 MG tablet Take 2 tablets by mouth in the morning, at noon, in the evening, and at bedtime.   . cholecalciferol (VITAMIN D) 1000 UNITS tablet Take 1,000 Units by mouth daily.  Mar[place] Kitchen glimepiride (AMARYL) 4 MG tablet Take 4 mg by mouth daily with breakfast.  . metFORMIN (GLUCOPHAGE) 1000 MG tablet Take 1,000 mg by mouth 2 (two) times daily with a meal.   . omeprazole (PRILOSEC) 20 MG capsule Take by mouth.  . predniSONE (DELTASONE) 5 MG tablet Take 5 mg by mouth daily with breakfast.  . rOPINIRole (REQUIP)  3 MG tablet TAKE 1 TABLET BY MOUTH IN THE MORNING, 1 TABLET AT NOON, AND 1 TABLET AT BEDTIME.  . Saw Palmetto 500 MG CAPS Take by mouth.  . simvastatin (ZOCOR) 40 MG tablet Take 40 mg by mouth every evening.  . tamsulosin (FLOMAX) 0.4 MG CAPS capsule Take 0.4 mg by mouth daily.   Marland Kitchen triamterene-hydrochlorothiazide (MAXZIDE-25) 37.5-25 MG per tablet Take 1 tablet by mouth daily.  . Omega-3 Fatty Acids (FISH OIL PO) Take by mouth daily. (Patient not taking: Reported on 02/07/2021)   No facility-administered encounter medications on file as of 02/07/2021.    Objective:   PHYSICAL EXAMINATION:    VITALS:   Vitals:   02/07/21 0813  BP: 132/68  Pulse: 65  SpO2: 99%  Weight: 182 lb (82.6 kg)   Height: 5\' 11"  (1.803 m)    GEN:  The patient appears stated age and is in NAD. HEENT:  Normocephalic, atraumatic.  The mucous membranes are moist. The superficial temporal arteries are without ropiness or tenderness. CV:  RRR Lungs:  CTAB Neck/HEME:  There are no carotid bruits bilaterally.  Neurological examination:  Orientation: The patient is alert and oriented x3. Cranial nerves: There is good facial symmetry with min facial hypomimia. The speech is fluent and clear. Soft palate rises symmetrically and there is no tongue deviation. Hearing is intact to conversational tone. Sensation: Sensation is intact to light touch throughout Motor: Strength is at least antigravity x4.  Movement examination: Tone: There is nl tone in the UE/LE. Abnormal movements: none seen today Coordination:  There is min decremation with finger taps bilaterally. Gait and Station: The patient has no difficulty arising out of a deep-seated chair without the use of the hands. The patient's stride length is good.  Neg pull test  I have reviewed and interpreted the following labs independently    Chemistry      Component Value Date/Time   NA 139 11/01/2015 1230   K 5.0 11/01/2015 1230   CL 103 11/01/2015 1230   CO2 30 11/01/2015 1230   BUN 18 11/01/2015 1230   CREATININE 1.15 11/01/2015 1230      Component Value Date/Time   CALCIUM 9.8 11/01/2015 1230   ALKPHOS 62 10/25/2009 1404   AST 18 10/25/2009 1404   ALT 12 10/25/2009 1404   BILITOT 0.8 10/25/2009 1404       Lab Results  Component Value Date   WBC 8.8 11/02/2009   HGB 11.8 (L) 11/02/2009   HCT 33.5 (L) 11/02/2009   MCV 91.7 11/02/2009   PLT 174 11/02/2009    No results found for: TSH   Total time spent on today's visit was 31 minutes, including both face-to-face time and nonface-to-face time.  Time included that spent on review of records (prior notes available to me/labs/imaging if pertinent), discussing treatment and goals,  answering patient's questions and coordinating care.  Cc:  Josetta Huddle, MD

## 2021-02-07 ENCOUNTER — Encounter: Payer: Self-pay | Admitting: Neurology

## 2021-02-07 ENCOUNTER — Ambulatory Visit: Payer: Medicare Other | Admitting: Neurology

## 2021-02-07 ENCOUNTER — Other Ambulatory Visit: Payer: Self-pay

## 2021-02-07 VITALS — BP 132/68 | HR 65 | Ht 71.0 in | Wt 182.0 lb

## 2021-02-07 DIAGNOSIS — G20 Parkinson's disease: Secondary | ICD-10-CM | POA: Diagnosis not present

## 2021-02-07 MED ORDER — CARBIDOPA-LEVODOPA 25-100 MG PO TABS
3.0000 | ORAL_TABLET | Freq: Three times a day (TID) | ORAL | 1 refills | Status: DC
Start: 2021-02-07 — End: 2021-06-23

## 2021-02-07 MED ORDER — ROPINIROLE HCL 3 MG PO TABS
ORAL_TABLET | ORAL | 1 refills | Status: DC
Start: 2021-02-07 — End: 2021-08-14

## 2021-02-07 NOTE — Patient Instructions (Signed)
1.  Increase carbidopa/levodopa 25/100 to 3 tablets at 7am/11am/4pm 2.  Decrease ropinirole to 3mg , 1 tablet in the AM, 1 in the afternoon and 1/2 tablet in the evening

## 2021-02-23 ENCOUNTER — Other Ambulatory Visit: Payer: Self-pay | Admitting: Neurology

## 2021-03-08 DIAGNOSIS — E7800 Pure hypercholesterolemia, unspecified: Secondary | ICD-10-CM | POA: Diagnosis not present

## 2021-03-08 DIAGNOSIS — I10 Essential (primary) hypertension: Secondary | ICD-10-CM | POA: Diagnosis not present

## 2021-03-08 DIAGNOSIS — E139 Other specified diabetes mellitus without complications: Secondary | ICD-10-CM | POA: Diagnosis not present

## 2021-03-08 DIAGNOSIS — G20 Parkinson's disease: Secondary | ICD-10-CM | POA: Diagnosis not present

## 2021-03-08 DIAGNOSIS — N183 Chronic kidney disease, stage 3 unspecified: Secondary | ICD-10-CM | POA: Diagnosis not present

## 2021-03-08 DIAGNOSIS — E119 Type 2 diabetes mellitus without complications: Secondary | ICD-10-CM | POA: Diagnosis not present

## 2021-03-08 DIAGNOSIS — K219 Gastro-esophageal reflux disease without esophagitis: Secondary | ICD-10-CM | POA: Diagnosis not present

## 2021-03-08 DIAGNOSIS — E1165 Type 2 diabetes mellitus with hyperglycemia: Secondary | ICD-10-CM | POA: Diagnosis not present

## 2021-03-08 DIAGNOSIS — E785 Hyperlipidemia, unspecified: Secondary | ICD-10-CM | POA: Diagnosis not present

## 2021-04-06 DIAGNOSIS — R739 Hyperglycemia, unspecified: Secondary | ICD-10-CM | POA: Diagnosis not present

## 2021-04-06 DIAGNOSIS — R531 Weakness: Secondary | ICD-10-CM | POA: Diagnosis not present

## 2021-04-07 DIAGNOSIS — E7800 Pure hypercholesterolemia, unspecified: Secondary | ICD-10-CM | POA: Diagnosis not present

## 2021-04-07 DIAGNOSIS — N183 Chronic kidney disease, stage 3 unspecified: Secondary | ICD-10-CM | POA: Diagnosis not present

## 2021-04-07 DIAGNOSIS — E119 Type 2 diabetes mellitus without complications: Secondary | ICD-10-CM | POA: Diagnosis not present

## 2021-04-07 DIAGNOSIS — E785 Hyperlipidemia, unspecified: Secondary | ICD-10-CM | POA: Diagnosis not present

## 2021-04-07 DIAGNOSIS — E1165 Type 2 diabetes mellitus with hyperglycemia: Secondary | ICD-10-CM | POA: Diagnosis not present

## 2021-04-07 DIAGNOSIS — G20 Parkinson's disease: Secondary | ICD-10-CM | POA: Diagnosis not present

## 2021-04-07 DIAGNOSIS — I10 Essential (primary) hypertension: Secondary | ICD-10-CM | POA: Diagnosis not present

## 2021-04-07 DIAGNOSIS — K219 Gastro-esophageal reflux disease without esophagitis: Secondary | ICD-10-CM | POA: Diagnosis not present

## 2021-04-13 DIAGNOSIS — R350 Frequency of micturition: Secondary | ICD-10-CM | POA: Diagnosis not present

## 2021-04-15 ENCOUNTER — Other Ambulatory Visit: Payer: Self-pay

## 2021-04-15 ENCOUNTER — Emergency Department (HOSPITAL_COMMUNITY)
Admission: EM | Admit: 2021-04-15 | Discharge: 2021-04-15 | Disposition: A | Discharge status: Home / Self Care | Payer: Medicare Other | Attending: Emergency Medicine | Admitting: Emergency Medicine

## 2021-04-15 DIAGNOSIS — I10 Essential (primary) hypertension: Secondary | ICD-10-CM | POA: Insufficient documentation

## 2021-04-15 DIAGNOSIS — Z96653 Presence of artificial knee joint, bilateral: Secondary | ICD-10-CM | POA: Insufficient documentation

## 2021-04-15 DIAGNOSIS — G20 Parkinson's disease: Secondary | ICD-10-CM | POA: Diagnosis not present

## 2021-04-15 DIAGNOSIS — R109 Unspecified abdominal pain: Secondary | ICD-10-CM | POA: Diagnosis not present

## 2021-04-15 DIAGNOSIS — R69 Illness, unspecified: Secondary | ICD-10-CM | POA: Insufficient documentation

## 2021-04-15 DIAGNOSIS — Z79899 Other long term (current) drug therapy: Secondary | ICD-10-CM | POA: Diagnosis not present

## 2021-04-15 DIAGNOSIS — E119 Type 2 diabetes mellitus without complications: Secondary | ICD-10-CM | POA: Insufficient documentation

## 2021-04-15 DIAGNOSIS — Z7984 Long term (current) use of oral hypoglycemic drugs: Secondary | ICD-10-CM | POA: Insufficient documentation

## 2021-04-15 DIAGNOSIS — R339 Retention of urine, unspecified: Secondary | ICD-10-CM | POA: Diagnosis not present

## 2021-04-15 DIAGNOSIS — Z7982 Long term (current) use of aspirin: Secondary | ICD-10-CM | POA: Diagnosis not present

## 2021-04-15 LAB — BASIC METABOLIC PANEL
Anion gap: 8 (ref 5–15)
BUN: 26 mg/dL — ABNORMAL HIGH (ref 8–23)
CO2: 25 mmol/L (ref 22–32)
Calcium: 9.2 mg/dL (ref 8.9–10.3)
Chloride: 97 mmol/L — ABNORMAL LOW (ref 98–111)
Creatinine, Ser: 1.22 mg/dL (ref 0.61–1.24)
GFR, Estimated: >60 mL/min (ref >60)
Glucose, Bld: 547 mg/dL (ref 70–99)
Potassium: 4.3 mmol/L (ref 3.5–5.1)
Sodium: 130 mmol/L — ABNORMAL LOW (ref 135–145)

## 2021-04-15 LAB — CBC WITH DIFFERENTIAL/PLATELET
Abs Immature Granulocytes: 0.02 10*3/uL (ref 0.00–0.07)
Basophils Absolute: 0 10*3/uL (ref 0.0–0.1)
Basophils Relative: 0 %
Eosinophils Absolute: 0.1 10*3/uL (ref 0.0–0.5)
Eosinophils Relative: 1 %
HCT: 44.4 % (ref 39.0–52.0)
Hemoglobin: 14.8 g/dL (ref 13.0–17.0)
Immature Granulocytes: 0 %
Lymphocytes Relative: 13 %
Lymphs Abs: 0.9 10*3/uL (ref 0.7–4.0)
MCH: 30.6 pg (ref 26.0–34.0)
MCHC: 33.3 g/dL (ref 30.0–36.0)
MCV: 91.9 fL (ref 80.0–100.0)
Monocytes Absolute: 0.5 10*3/uL (ref 0.1–1.0)
Monocytes Relative: 7 %
Neutro Abs: 5.4 10*3/uL (ref 1.7–7.7)
Neutrophils Relative %: 79 %
Platelets: 264 10*3/uL (ref 150–400)
RBC: 4.83 MIL/uL (ref 4.22–5.81)
RDW: 13.3 % (ref 11.5–15.5)
WBC: 6.9 10*3/uL (ref 4.0–10.5)
nRBC: 0 % (ref 0.0–0.2)

## 2021-04-15 LAB — URINALYSIS, ROUTINE W REFLEX MICROSCOPIC
Bacteria, UA: NONE SEEN
Bilirubin Urine: NEGATIVE
Glucose, UA: >=500 mg/dL — AB
Ketones, ur: NEGATIVE mg/dL
Leukocytes,Ua: NEGATIVE
Nitrite: NEGATIVE
Protein, ur: NEGATIVE mg/dL
Specific Gravity, Urine: 1.024 (ref 1.005–1.030)
pH: 6 (ref 5.0–8.0)

## 2021-04-15 LAB — CBG MONITORING, ED: Glucose-Capillary: 267 mg/dL — ABNORMAL HIGH (ref 70–99)

## 2021-04-15 MED ORDER — SODIUM CHLORIDE 0.9 % IV BOLUS
500.0000 mL | Freq: Once | INTRAVENOUS | Status: AC
  Administered 2021-04-15: 500 mL via INTRAVENOUS

## 2021-04-15 MED ORDER — INSULIN ASPART 100 UNIT/ML IJ SOLN
4.0000 [IU] | Freq: Once | INTRAMUSCULAR | Status: AC
  Administered 2021-04-15: 4 [IU] via INTRAVENOUS
  Filled 2021-04-15: qty 0.04

## 2021-04-15 NOTE — ED Triage Notes (Signed)
Patient reports he has UTI and is unable to urinate. Says he hasnt urinated for hours. Pain is 10/10.

## 2021-04-15 NOTE — Discharge Instructions (Addendum)
Return for any problem.   Please contact your Urologist on Tuesday morning as instructed.

## 2021-04-15 NOTE — ED Provider Notes (Signed)
[facility] DEPT Provider Note   CSN: [id number] Arrival date & time: 04/15/21  1444     History Chief Complaint  Patient presents with   Urinary Retention    [name] is a 75 y.o. male.  75 year old male with prior medical history as detailed below presents for evaluation.  Patient reports inability to produce urine for the last 12+ hours.  His last urination was around 1 AM.  He reports prior history of urinary retention.  He reports prior history of benign prostate enlargement.  He complains of suprapubic abdominal discomfort.  Denies fever.  He denies other complaint.  He has established urologic care.  The history is provided by the patient and medical records.  Illness Location:  Urinary retention x12+ hours Severity:  Moderate Onset quality:  Gradual Duration:  12 hours Timing:  Constant Progression:  Worsening Chronicity:  Recurrent Associated symptoms: abdominal pain   Associated symptoms: no fever       Past Medical History:  Diagnosis Date   Allergic rhinitis    Arthritis    BPH (benign prostatic hyperplasia)    Diabetes ([place])    Dysphagia, pharyngeal phase    GERD diagnosed on barium swallow. Has small hiatal hernia. Symptomatically somewhat better on omeprazole but not entirely. We'll try b.i.d. therapy   Edema    1+ in both ankles, likely multifactorial including medication such as Requip   Erectile dysfunction    Staxyn 10 mg or Viagra worked well. 3 samples of Cialis 20 mg provided   GERD (gastroesophageal reflux disease)    Hypercholesteremia    Hypertension    Nephrolithiasis    Onychomycosis of toenail    April 27, 2013 - Dr. Inocencio Le - podiatry, was in [place] - treating with oral Lamisil and topical nail therapy   Parkinson's disease [facility])     followed by Dr. Lezlie Le at [facility] and [name], M.D. in [place]   Presbycusis    and tinnitus - Dr. Izora Le - August/2013   Renal calculus     Syncope     Patient Active Problem List   Diagnosis Date Noted   Abnormal involuntary movement 02/18/2018   Encounter for general adult medical examination with abnormal findings 02/18/2018   Enlarged prostate 02/18/2018   Hearing loss 02/18/2018   Other long term (current) drug therapy 02/18/2018   Type 2 diabetes mellitus with hyperglycemia ([place]) 02/18/2018   Reducible left inguinal hernia 11/03/2015   Hypercholesteremia    Nephrolithiasis    Parkinson's disease ([place])    Hypertension    Syncope    Renal calculus    Diabetes ([name])    Allergic rhinitis    BPH (benign prostatic hyperplasia)    Erectile dysfunction    Dysphagia, pharyngeal phase    Edema    Presbycusis    Onychomycosis of toenail    Ureteral calculus 02/09/2013   LEG PAIN 03/14/2010    Past Surgical History:  Procedure Laterality Date   HAND SURGERY     INGUINAL HERNIA REPAIR Left 11/04/2015   Procedure: LEFT INGUINAL HERNIA REPAIR WITH MESH;  Surgeon: Armandina Gemma, MD;  Location: [place];  Service: General;  Laterality: Left;   INSERTION OF MESH Left 11/04/2015   Procedure: INSERTION OF MESH;  Surgeon: Armandina Gemma, MD;  Location: [facility];  Service: General;  Laterality: Left;   JOINT REPLACEMENT Bilateral    KNEE SURGERY     TOTAL KNEE ARTHROPLASTY  Family History  Problem Relation Age of Onset   Atrial fibrillation Mother    Breast cancer Mother    Emphysema Father    Heart disease Father    Cancer Brother        African Burkitt    Social History   Tobacco Use   Smoking status: Never   Smokeless tobacco: Never  Substance Use Topics   Alcohol use: No   Drug use: No    Home Medications Prior to Admission medications   Medication Sig Start Date End Date Taking? Authorizing Provider  amantadine (SYMMETREL) 100 MG capsule Take 100 mg by mouth 3 (three) times daily.    Yes [provider]  amoxicillin (AMOXIL) 500 MG capsule Take 2,000 mg  by mouth See admin instructions. Take 4 tablets (2000 mg) prior to dental appointments 12/19/17  Yes [provider]  aspirin 81 MG tablet Take 81 mg by mouth See admin instructions. Take 1 tablet (81 mg) Mon-Fri   Yes [provider]  carbidopa-levodopa (SINEMET IR) 25-100 MG tablet Take 3 tablets by mouth 3 (three) times daily. 3 tablets at 7am/11am/4pm 02/07/21  Yes [name], [name], DO  cholecalciferol (VITAMIN D) 1000 UNITS tablet Take 1,000 Units by mouth daily.   Yes [provider]  glimepiride (AMARYL) 4 MG tablet Take 4 mg by mouth daily with breakfast.   Yes [provider]  metFORMIN (GLUCOPHAGE) 1000 MG tablet Take 1,000 mg by mouth 2 (two) times daily with a meal.    Yes [provider]  omeprazole (PRILOSEC) 20 MG capsule Take 20 mg by mouth daily as needed (acid reflux).   Yes [provider]  predniSONE (DELTASONE) 1 MG tablet Take 8 mg by mouth See admin instructions. Take 8 tablets (8 mg) for 1 week 04/07/21  Yes [provider]  rOPINIRole (REQUIP) 3 MG tablet 1 in the AM, 1 in the afternoon, 1/2 tablet in the evening 02/07/21  Yes [name], Rebecca S, DO  Saw Palmetto 500 MG CAPS Take 1 capsule by mouth daily.   Yes [provider]  simvastatin (ZOCOR) 40 MG tablet Take 40 mg by mouth every evening.   Yes [provider]  sulfamethoxazole-trimethoprim (BACTRIM DS) 800-160 MG tablet Take 1 tablet by mouth 2 (two) times daily. 04/15/21  Yes [provider]  tamsulosin (FLOMAX) 0.4 MG CAPS capsule Take 0.4 mg by mouth daily.  01/24/18  Yes [provider]  triamterene-hydrochlorothiazide (MAXZIDE-25) 37.5-25 MG per tablet Take 1 tablet by mouth daily.   Yes [provider]    Allergies    Oxycodone and Sudafed [pseudoephedrine hcl]  Review of Systems   Review of Systems  Constitutional:  Negative for fever.  Gastrointestinal:  Positive for abdominal pain.  All other systems reviewed  and are negative.  Physical Exam Updated Vital Signs BP (!) 174/103 (BP Location: Right Arm)   Pulse 91   Temp 97.6 F (36.4 C) (Oral)   Resp 17   Ht 6' (1.829 m)   Wt 83.9 kg   SpO2 95%   BMI 25.09 kg/m   Physical Exam Vitals and nursing note reviewed.  Constitutional:      General: He is not in acute distress.    Appearance: Normal appearance. He is well-developed.  HENT:     Head: Normocephalic and atraumatic.  Eyes:     Conjunctiva/sclera: Conjunctivae normal.     Pupils: Pupils are equal, round, and reactive to light.  Cardiovascular:  Rate and Rhythm: Normal rate and regular rhythm.     Heart sounds: Normal heart sounds.  Pulmonary:     Effort: Pulmonary effort is normal. No respiratory distress.     Breath sounds: Normal breath sounds.  Abdominal:     General: There is no distension.     Palpations: Abdomen is soft.     Tenderness: There is abdominal tenderness.     Comments: Mild suprapubic tenderness and enlarged bladder noted on exam  Musculoskeletal:        General: No deformity. Normal range of motion.     Cervical back: Normal range of motion and neck supple.  Skin:    General: Skin is warm and dry.  Neurological:     General: No focal deficit present.     Mental Status: He is alert and oriented to person, place, and time.    ED Results / Procedures / Treatments   Labs (all labs ordered are listed, but only abnormal results are displayed) Labs Reviewed  URINALYSIS, ROUTINE W REFLEX MICROSCOPIC  BASIC METABOLIC PANEL  CBC WITH DIFFERENTIAL/PLATELET    EKG None  Radiology No results found.  Procedures Procedures   Medications Ordered in ED Medications - No data to display  ED Course  I have reviewed the triage vital signs and the nursing notes.  Pertinent labs & imaging results that were available during my care of the patient were reviewed by me and considered in my medical decision making (see chart for details).    MDM  Rules/Calculators/A&P                          MDM  MSE complete  ULYSSES ALPER was evaluated in Emergency Department on 04/15/2021 for the symptoms described in the history of present illness. He was evaluated in the context of the global COVID-19 pandemic, which necessitated consideration that the patient might be at risk for infection with the SARS-CoV-2 virus that causes COVID-19. Institutional protocols and algorithms that pertain to the evaluation of patients at risk for COVID-19 are in a state of rapid change based on information released by regulatory bodies including the CDC and federal and state organizations. These policies and algorithms were followed during the patient's care in the ED.  Patient with urinary retention.  Patient with established urologic care.  Catheter inserted without difficulty.  Patient feels significantly improved.  Patient understands need for close follow-up with urology later this week.  Importance of close follow-up is stressed.  Strict return precautions given and understood.  Patient already has prescription for antibiotics from his urologist from yesterday.  He understands need to take this.   Final Clinical Impression(s) / ED Diagnoses Final diagnoses:  Urinary retention    Rx / DC Orders ED Discharge Orders     None        Valarie Merino, MD 04/15/21 1940

## 2021-04-15 NOTE — ED Notes (Signed)
Urine culture sent to lab with UA , by nubia o.

## 2021-04-24 DIAGNOSIS — R338 Other retention of urine: Secondary | ICD-10-CM | POA: Diagnosis not present

## 2021-05-03 DIAGNOSIS — G20 Parkinson's disease: Secondary | ICD-10-CM | POA: Diagnosis not present

## 2021-05-03 DIAGNOSIS — R3914 Feeling of incomplete bladder emptying: Secondary | ICD-10-CM | POA: Diagnosis not present

## 2021-05-03 DIAGNOSIS — E1165 Type 2 diabetes mellitus with hyperglycemia: Secondary | ICD-10-CM | POA: Diagnosis not present

## 2021-05-04 ENCOUNTER — Encounter: Payer: Self-pay | Admitting: Neurology

## 2021-05-08 DIAGNOSIS — R3914 Feeling of incomplete bladder emptying: Secondary | ICD-10-CM | POA: Diagnosis not present

## 2021-05-09 ENCOUNTER — Telehealth: Payer: Self-pay | Admitting: Neurology

## 2021-05-09 NOTE — Telephone Encounter (Signed)
I respect what she is saying, but that just does not make sense.  She said that he has lost 20 pounds in the last 6 weeks and I haven't changed meds in over 3 months.

## 2021-05-09 NOTE — Telephone Encounter (Signed)
This is obviously very concerning but WAY out of my field of expertise.  If his regular PCP is on vacation, there should be someone covering for him that he should be able to see.  This wouldn't have anything to do with Parkinsons Disease.

## 2021-05-09 NOTE — Telephone Encounter (Signed)
Patient has lost 20 pounds in the last six weeks. His wife Izora Gala called requesting a call back about his medications. He has been evaluated by primary care but his regular doctor is on vacation.  He is having problems keeping his medications down with diarrhea and vomiting. He has no energy and sleeps all the time. He is shutting down completely, she said.  Dr. Doristine Devoid recommendations are requested.

## 2021-05-09 NOTE — Telephone Encounter (Signed)
Called patients wife and informed her Dr. Doristine Devoid recommendations of this being very concerning but WAY out of her field of expertise.  If his regular PCP is on vacation, there should be someone covering for him that he should be able to see. This wouldn't have anything to do with Parkinsons Disease.    Patients wife stated that patient has seen a PCP and has been evaluated for these complaints. However, patients wife thinks that the large amount of medications that he is on and takes multiple times a day is causing him to become so sick which makes him loose his appetite. Patients wife wanted to ask Dr. Carles Collet if she could switch patients medications to a time release to where patient only needs to take it once a day.   I informed patient that I will have to send message back to Dr. Carles Collet and will give her a call once I hear back from Dr. Carles Collet.

## 2021-05-10 DIAGNOSIS — R338 Other retention of urine: Secondary | ICD-10-CM | POA: Diagnosis not present

## 2021-05-10 NOTE — Telephone Encounter (Signed)
Called patients wife and left a message for a call back.  

## 2021-05-10 NOTE — Telephone Encounter (Signed)
Pt wife is returning call to [facility]. [phone]

## 2021-05-11 NOTE — Telephone Encounter (Signed)
Per Dr. Carles Collet ok to add patient to wait list. Moving appointment up from September not necessary and ok to see patient on original scheduled day in September.

## 2021-05-11 NOTE — Telephone Encounter (Signed)
Called patients wife and informed her that Dr. Carles Collet  respects what she is saying, but that just does not make sense.  She said that he has lost 20 pounds in the last 6 weeks and I haven't changed meds in over 3 months. Patients wife stated that patient does not like taking and swallowing all the medication from Dr. Carles Collet and still wants it to be changed to time release so it lowers the amount of pills he has to swallow.  I informed patients wife that Dr. Carles Collet will not be making any changes to his medication right now due to the fact that it isn't the medication making him sick because nothing has been changes in 3 months. Patient may have some other issue going on and it needs to be addressed by his PCP.  Patients wife demanded patient be seen sooner than September. I informed patient that I will have someone from the front call her to place patient on a waiting list.

## 2021-05-12 DIAGNOSIS — K219 Gastro-esophageal reflux disease without esophagitis: Secondary | ICD-10-CM | POA: Diagnosis not present

## 2021-05-12 DIAGNOSIS — E7800 Pure hypercholesterolemia, unspecified: Secondary | ICD-10-CM | POA: Diagnosis not present

## 2021-05-12 DIAGNOSIS — E1165 Type 2 diabetes mellitus with hyperglycemia: Secondary | ICD-10-CM | POA: Diagnosis not present

## 2021-05-12 DIAGNOSIS — G20 Parkinson's disease: Secondary | ICD-10-CM | POA: Diagnosis not present

## 2021-05-12 DIAGNOSIS — N183 Chronic kidney disease, stage 3 unspecified: Secondary | ICD-10-CM | POA: Diagnosis not present

## 2021-05-12 DIAGNOSIS — I10 Essential (primary) hypertension: Secondary | ICD-10-CM | POA: Diagnosis not present

## 2021-05-12 DIAGNOSIS — E785 Hyperlipidemia, unspecified: Secondary | ICD-10-CM | POA: Diagnosis not present

## 2021-05-12 DIAGNOSIS — E119 Type 2 diabetes mellitus without complications: Secondary | ICD-10-CM | POA: Diagnosis not present

## 2021-05-12 DIAGNOSIS — R338 Other retention of urine: Secondary | ICD-10-CM | POA: Diagnosis not present

## 2021-05-12 NOTE — Telephone Encounter (Signed)
Confirmed patient added to wait list.

## 2021-05-17 DIAGNOSIS — Z7984 Long term (current) use of oral hypoglycemic drugs: Secondary | ICD-10-CM | POA: Diagnosis not present

## 2021-05-17 DIAGNOSIS — G20 Parkinson's disease: Secondary | ICD-10-CM | POA: Diagnosis not present

## 2021-05-17 DIAGNOSIS — R338 Other retention of urine: Secondary | ICD-10-CM | POA: Diagnosis not present

## 2021-05-17 DIAGNOSIS — E1165 Type 2 diabetes mellitus with hyperglycemia: Secondary | ICD-10-CM | POA: Diagnosis not present

## 2021-05-17 DIAGNOSIS — E7800 Pure hypercholesterolemia, unspecified: Secondary | ICD-10-CM | POA: Diagnosis not present

## 2021-05-17 DIAGNOSIS — R600 Localized edema: Secondary | ICD-10-CM | POA: Diagnosis not present

## 2021-05-29 DIAGNOSIS — R338 Other retention of urine: Secondary | ICD-10-CM | POA: Diagnosis not present

## 2021-06-01 DIAGNOSIS — I10 Essential (primary) hypertension: Secondary | ICD-10-CM | POA: Diagnosis not present

## 2021-06-01 DIAGNOSIS — E119 Type 2 diabetes mellitus without complications: Secondary | ICD-10-CM | POA: Diagnosis not present

## 2021-06-01 DIAGNOSIS — E785 Hyperlipidemia, unspecified: Secondary | ICD-10-CM | POA: Diagnosis not present

## 2021-06-01 DIAGNOSIS — N183 Chronic kidney disease, stage 3 unspecified: Secondary | ICD-10-CM | POA: Diagnosis not present

## 2021-06-01 DIAGNOSIS — G20 Parkinson's disease: Secondary | ICD-10-CM | POA: Diagnosis not present

## 2021-06-01 DIAGNOSIS — K219 Gastro-esophageal reflux disease without esophagitis: Secondary | ICD-10-CM | POA: Diagnosis not present

## 2021-06-01 DIAGNOSIS — E1165 Type 2 diabetes mellitus with hyperglycemia: Secondary | ICD-10-CM | POA: Diagnosis not present

## 2021-06-01 DIAGNOSIS — E7800 Pure hypercholesterolemia, unspecified: Secondary | ICD-10-CM | POA: Diagnosis not present

## 2021-06-01 DIAGNOSIS — E139 Other specified diabetes mellitus without complications: Secondary | ICD-10-CM | POA: Diagnosis not present

## 2021-06-18 ENCOUNTER — Encounter (HOSPITAL_COMMUNITY): Payer: Self-pay

## 2021-06-18 ENCOUNTER — Emergency Department (HOSPITAL_COMMUNITY)
Admission: EM | Admit: 2021-06-18 | Discharge: 2021-06-18 | Disposition: A | Discharge status: Home / Self Care | Payer: Medicare Other | Attending: Emergency Medicine | Admitting: Emergency Medicine

## 2021-06-18 ENCOUNTER — Other Ambulatory Visit: Payer: Self-pay

## 2021-06-18 DIAGNOSIS — R339 Retention of urine, unspecified: Secondary | ICD-10-CM | POA: Diagnosis not present

## 2021-06-18 DIAGNOSIS — Z96653 Presence of artificial knee joint, bilateral: Secondary | ICD-10-CM | POA: Diagnosis not present

## 2021-06-18 DIAGNOSIS — G20 Parkinson's disease: Secondary | ICD-10-CM | POA: Diagnosis not present

## 2021-06-18 DIAGNOSIS — E119 Type 2 diabetes mellitus without complications: Secondary | ICD-10-CM | POA: Insufficient documentation

## 2021-06-18 DIAGNOSIS — Z79899 Other long term (current) drug therapy: Secondary | ICD-10-CM | POA: Diagnosis not present

## 2021-06-18 DIAGNOSIS — Z7982 Long term (current) use of aspirin: Secondary | ICD-10-CM | POA: Diagnosis not present

## 2021-06-18 DIAGNOSIS — R1030 Lower abdominal pain, unspecified: Secondary | ICD-10-CM | POA: Diagnosis not present

## 2021-06-18 DIAGNOSIS — I10 Essential (primary) hypertension: Secondary | ICD-10-CM | POA: Insufficient documentation

## 2021-06-18 DIAGNOSIS — Z7984 Long term (current) use of oral hypoglycemic drugs: Secondary | ICD-10-CM | POA: Diagnosis not present

## 2021-06-18 LAB — BASIC METABOLIC PANEL
Anion gap: 7 (ref 5–15)
BUN: 27 mg/dL — ABNORMAL HIGH (ref 8–23)
CO2: 27 mmol/L (ref 22–32)
Calcium: 9 mg/dL (ref 8.9–10.3)
Chloride: 106 mmol/L (ref 98–111)
Creatinine, Ser: 1.18 mg/dL (ref 0.61–1.24)
GFR, Estimated: >60 mL/min (ref >60)
Glucose, Bld: 183 mg/dL — ABNORMAL HIGH (ref 70–99)
Potassium: 4.2 mmol/L (ref 3.5–5.1)
Sodium: 140 mmol/L (ref 135–145)

## 2021-06-18 LAB — CBC
HCT: 42 % (ref 39.0–52.0)
Hemoglobin: 13.4 g/dL (ref 13.0–17.0)
MCH: 30.7 pg (ref 26.0–34.0)
MCHC: 31.9 g/dL (ref 30.0–36.0)
MCV: 96.1 fL (ref 80.0–100.0)
Platelets: 344 10*3/uL (ref 150–400)
RBC: 4.37 MIL/uL (ref 4.22–5.81)
RDW: 14 % (ref 11.5–15.5)
WBC: 9.6 10*3/uL (ref 4.0–10.5)
nRBC: 0 % (ref 0.0–0.2)

## 2021-06-18 NOTE — ED Provider Notes (Signed)
[facility] DEPT Provider Note   CSN: [id number] Arrival date & time: 06/18/21  [id number]     History Chief Complaint  Patient presents with   Urinary Retention    [name] is a 75 y.o. male.  75 year old male with history of BPH presents to the ER secondary to urinary retention.  He follows closely with urology.  Foley cath removed within the last week.  Patient unable to void since yesterday evening.  He has pain to suprapubic region.  No nausea or vomiting.  No fevers or chills.  He is tolerating oral intake without difficulty.  No change to bowel function.  No lower extremity swelling.  He has appointment with urology on Tuesday.  This is similar to prior episodes of urinary retention  The history is provided by the patient. No language interpreter was used.      Past Medical History:  Diagnosis Date   Allergic rhinitis    Arthritis    BPH (benign prostatic hyperplasia)    Diabetes ([place])    Dysphagia, pharyngeal phase    GERD diagnosed on barium swallow. Has small hiatal hernia. Symptomatically somewhat better on omeprazole but not entirely. We'll try b.i.d. therapy   Edema    1+ in both ankles, likely multifactorial including medication such as Requip   Erectile dysfunction    Staxyn 10 mg or Viagra worked well. 3 samples of Cialis 20 mg provided   GERD (gastroesophageal reflux disease)    Hypercholesteremia    Hypertension    Nephrolithiasis    Onychomycosis of toenail    April 27, 2013 - Dr. Inocencio Homes - podiatry, was in [place] - treating with oral Lamisil and topical nail therapy   Parkinson's disease [facility])     followed by Dr. Lezlie Octave at [facility] and [name], M.D. in [place]   Presbycusis    and tinnitus - Dr. Izora Gala - August/2013   Renal calculus    Syncope     Patient Active Problem List   Diagnosis Date Noted   Abnormal involuntary movement 02/18/2018   Encounter for general adult medical examination with abnormal  findings 02/18/2018   Enlarged prostate 02/18/2018   Hearing loss 02/18/2018   Other long term (current) drug therapy 02/18/2018   Type 2 diabetes mellitus with hyperglycemia ([place]) 02/18/2018   Reducible left inguinal hernia 11/03/2015   Hypercholesteremia    Nephrolithiasis    Parkinson's disease ([place])    Hypertension    Syncope    Renal calculus    Diabetes ([place])    Allergic rhinitis    BPH (benign prostatic hyperplasia)    Erectile dysfunction    Dysphagia, pharyngeal phase    Edema    Presbycusis    Onychomycosis of toenail    Ureteral calculus 02/09/2013   LEG PAIN 03/14/2010    Past Surgical History:  Procedure Laterality Date   HAND SURGERY     INGUINAL HERNIA REPAIR Left 11/04/2015   Procedure: LEFT INGUINAL HERNIA REPAIR WITH MESH;  Surgeon: Armandina Gemma, MD;  Location: [place];  Service: General;  Laterality: Left;   INSERTION OF MESH Left 11/04/2015   Procedure: INSERTION OF MESH;  Surgeon: Armandina Gemma, MD;  Location: [place];  Service: General;  Laterality: Left;   JOINT REPLACEMENT Bilateral    KNEE SURGERY     TOTAL KNEE ARTHROPLASTY         Family History  Problem Relation Age of Onset  Atrial fibrillation Mother    Breast cancer Mother    Emphysema Father    Heart disease Father    Cancer Brother        African Burkitt    Social History   Tobacco Use   Smoking status: Never   Smokeless tobacco: Never  Substance Use Topics   Alcohol use: No   Drug use: No    Home Medications Prior to Admission medications   Medication Sig Start Date End Date Taking? Authorizing Provider  amantadine (SYMMETREL) 100 MG capsule Take 100 mg by mouth 3 (three) times daily.     [provider]  amoxicillin (AMOXIL) 500 MG capsule Take 2,000 mg by mouth See admin instructions. Take 4 tablets (2000 mg) prior to dental appointments 12/19/17   [provider]  aspirin 81 MG tablet Take 81 mg by mouth See admin  instructions. Take 1 tablet (81 mg) Mon-Fri    [provider]  carbidopa-levodopa (SINEMET IR) 25-100 MG tablet Take 3 tablets by mouth 3 (three) times daily. 3 tablets at 7am/11am/4pm 02/07/21   [name], [name], DO  cholecalciferol (VITAMIN D) 1000 UNITS tablet Take 1,000 Units by mouth daily.    [provider]  glimepiride (AMARYL) 4 MG tablet Take 4 mg by mouth daily with breakfast.    [provider]  metFORMIN (GLUCOPHAGE) 1000 MG tablet Take 1,000 mg by mouth 2 (two) times daily with a meal.     [provider]  omeprazole (PRILOSEC) 20 MG capsule Take 20 mg by mouth daily as needed (acid reflux).    [provider]  predniSONE (DELTASONE) 1 MG tablet Take 8 mg by mouth See admin instructions. Take 8 tablets (8 mg) for 1 week 04/07/21   [provider]  rOPINIRole (REQUIP) 3 MG tablet 1 in the AM, 1 in the afternoon, 1/2 tablet in the evening 02/07/21   [name], Rebecca S, DO  Saw Palmetto 500 MG CAPS Take 1 capsule by mouth daily.    [provider]  simvastatin (ZOCOR) 40 MG tablet Take 40 mg by mouth every evening.    [provider]  sulfamethoxazole-trimethoprim (BACTRIM DS) 800-160 MG tablet Take 1 tablet by mouth 2 (two) times daily. 04/15/21   [provider]  tamsulosin (FLOMAX) 0.4 MG CAPS capsule Take 0.4 mg by mouth daily.  01/24/18   [provider]  triamterene-hydrochlorothiazide (MAXZIDE-25) 37.5-25 MG per tablet Take 1 tablet by mouth daily.    [provider]    Allergies    Oxycodone and Sudafed [pseudoephedrine hcl]  Review of Systems   Review of Systems  Constitutional:  Negative for chills and fever.  HENT:  Negative for facial swelling and trouble swallowing.   Eyes:  Negative for photophobia and visual disturbance.  Respiratory:  Negative for cough and shortness of breath.   Cardiovascular:  Negative for chest pain and palpitations.  Gastrointestinal:  Positive for  abdominal pain. Negative for nausea and vomiting.  Endocrine: Negative for polydipsia and polyuria.  Genitourinary:  Positive for difficulty urinating. Negative for hematuria.  Musculoskeletal:  Negative for gait problem and joint swelling.  Skin:  Negative for pallor and rash.  Neurological:  Negative for syncope and headaches.  Psychiatric/Behavioral:  Negative for agitation and confusion.    Physical Exam Updated Vital Signs BP 130/61   Pulse 68   Temp 98.2 F (36.8 C) (Oral)   Resp 16   SpO2 94%   Physical Exam Vitals and nursing note  reviewed.  Constitutional:      General: He is not in acute distress.    Appearance: He is well-developed.  HENT:     Head: Normocephalic and atraumatic.     Right Ear: External ear normal.     Left Ear: External ear normal.     Mouth/Throat:     Mouth: Mucous membranes are moist.  Eyes:     General: No scleral icterus. Cardiovascular:     Rate and Rhythm: Normal rate and regular rhythm.     Pulses: Normal pulses.     Heart sounds: Normal heart sounds.  Pulmonary:     Effort: Pulmonary effort is normal. No respiratory distress.     Breath sounds: Normal breath sounds.  Abdominal:     General: Abdomen is flat. Bowel sounds are normal.     Palpations: Abdomen is soft.     Tenderness: There is abdominal tenderness in the suprapubic area.  Musculoskeletal:        General: Normal range of motion.     Cervical back: Normal range of motion.     Right lower leg: No edema.     Left lower leg: No edema.  Skin:    General: Skin is warm and dry.     Capillary Refill: Capillary refill takes less than 2 seconds.  Neurological:     Mental Status: He is alert and oriented to person, place, and time.  Psychiatric:        Mood and Affect: Mood normal.        Behavior: Behavior normal.    ED Results / Procedures / Treatments   Labs (all labs ordered are listed, but only abnormal results are displayed) Labs Reviewed  BASIC METABOLIC PANEL -  Abnormal; Notable for the following components:      Result Value   Glucose, Bld 183 (*)    BUN 27 (*)    All other components within normal limits  CBC    EKG None  Radiology No results found.  Procedures Procedures   Medications Ordered in ED Medications - No data to display  ED Course  I have reviewed the triage vital signs and the nursing notes.  Pertinent labs & imaging results that were available during my care of the patient were reviewed by me and considered in my medical decision making (see chart for details).    MDM Rules/Calculators/A&P                           75 year old male with history of urinary retention to the ER with complaint and history as above.  Patient with pain to suprapubic region.  Last spontaneous voiding was yesterday evening.  Vital signs reviewed and are stable.  Serious etiology considered.  Patient with approximately 600 mL on bladder scan.  Supra pubic pain.  History of urinary retention.  Will place Foley catheter.  Obtain screening labs. Labs reviewed and are stable. Stable renal function and electrolytes.   Patient with significant provement to symptoms following Foley catheter placement.  Draining appropriately.  Rpt Evaluation is reassuring and pain has subsided. He has follow-up with urology on Tuesday. Advised him to keep this appointment.  He is already on flomax.   The patient improved significantly and was discharged in stable condition. Detailed discussions were had with the patient regarding current findings, and need for close f/u with PCP or on call doctor. The patient has been instructed to return immediately if  the symptoms worsen in any way for re-evaluation. Patient verbalized understanding and is in agreement with current care plan. All questions answered prior to discharge.   Final Clinical Impression(s) / ED Diagnoses Final diagnoses:  Urinary retention    Rx / DC Orders ED Discharge Orders     None         Jeanell Sparrow, DO 06/18/21 1816

## 2021-06-18 NOTE — ED Notes (Signed)
Pt refusing bloodwork at this time

## 2021-06-18 NOTE — ED Triage Notes (Signed)
Pt reports urinary retention since last night. Pt reports having a foley catheter removed on Thursday.

## 2021-06-22 NOTE — Progress Notes (Signed)
Assessment/Plan:   1.  Parkinsons Disease, diagnosed 2010 with symptoms to 2008  - carbidopa/levodopa 25/100, 3 po qid at 6 AM/10 AM/2 PM/6 PM.  Patient had increased his levodopa from 9 pills to 12 pills last visit.  I left it at 12 pills, but I did tell him not to increase this dramatically again without letting me know.  -add entacapone 200 mg with first three dosages of levodopa  -For now, we will continue ropinirole 3 mg, 1/1/0.5  -Continue amantadine, 100 mg 3 times per day.  May decrease this in the future, as he is using it for tremor (and he really does not have any), and this medication generally does not work well for tremor  -declines PT  2.  Significant lower extremity edema with 25 pound weight gain over the last 2 months  -I talked with patient's cardiologist and he is going to get the patient in for an appointment.  -Patient wanted to add more prednisone (he thought that it would help, as he thinks that it is from inflammation) and I discussed with him that this would only make things worse.     Subjective:   [name] was seen today in follow up for Parkinsons disease.  My previous records were reviewed prior to todays visit as well as outside records available to me.  I slightly decreased his Requip last visit and just slightly increase the levodopa last visit.  He tolerated that well.  3 months later, his wife called me to state that he has lost 20 pounds over a 6-week time span (not reflected in todays weight).  She thought that it was due to his medication because he was having vomiting and diarrhea.  We explained that I had not changed his medicine in over 3 months (and those were just small changes) and while I was very concerned, I did not think that it had anything to do with Parkinson's disease and he needed to follow-up with primary care.  His wife wanted to change his medication to time-released (she was under the perception that this was only once a day  medicine), but explained that this really would not make sense and I did not think it would help his symptoms.  Reiterated the importance of following up with primary care.  Medical record review indicate that he was in the emergency room both July 2 and September 4 with urinary retention.  He does follow with urology.  Pt states today that he had covid in June and was placed on prednisone and states that pcp didn't realize ortho had RX prednisone as well.  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  He attributes this to knee issues and wonders if he needs more prednisone.  States that his walking is worse.  Current prescribed movement disorder medications: Carbidopa/levodopa 25/100, 3 tablets 3 times per day (recently increased) - pt actually taking 2 at 6am/3 at 8am/4at noon/3 at 6 Amantadine 1 tablet tid (on it for tremor and not dyskinesia) requip 3 mg, 1 tablet in the morning, 1 tablet in the morning, 1 in the afternoon, half tablet in the evening (slight decrease last time)   PREVIOUS MEDICATIONS: neupro (helped but costly); requip; levodopa IR  ALLERGIES:   Allergies  Allergen Reactions   Oxycodone Itching    This makes  him itch   Sudafed [Pseudoephedrine Hcl]     Problems urinating     CURRENT MEDICATIONS:  Outpatient Encounter Medications as of 06/23/2021  Medication Sig   amantadine (SYMMETREL) 100 MG capsule Take 100 mg by mouth 3 (three) times daily.    amoxicillin (AMOXIL) 500 MG capsule Take 2,000 mg by mouth See admin instructions. Take 4 tablets (2000 mg) prior to dental appointments   aspirin 81 MG tablet Take 81 mg by mouth See admin instructions. Take 1 tablet (81 mg) Mon-Fri   carbidopa-levodopa (SINEMET IR) 25-100 MG tablet Take 3 tablets by mouth 3 (three) times daily. 3 tablets at 7am/11am/4pm   cholecalciferol (VITAMIN D) 1000 UNITS  tablet Take 1,000 Units by mouth daily.   glimepiride (AMARYL) 4 MG tablet Take 4 mg by mouth daily with breakfast.   metFORMIN (GLUCOPHAGE) 1000 MG tablet Take 1,000 mg by mouth 2 (two) times daily with a meal.    omeprazole (PRILOSEC) 20 MG capsule Take 20 mg by mouth daily as needed (acid reflux).   predniSONE (DELTASONE) 1 MG tablet Take 8 mg by mouth See admin instructions. Take 8 tablets (8 mg) for 1 week   rOPINIRole (REQUIP) 3 MG tablet 1 in the AM, 1 in the afternoon, 1/2 tablet in the evening   Saw Palmetto 500 MG CAPS Take 1 capsule by mouth daily.   simvastatin (ZOCOR) 40 MG tablet Take 40 mg by mouth every evening.   sulfamethoxazole-trimethoprim (BACTRIM DS) 800-160 MG tablet Take 1 tablet by mouth 2 (two) times daily.   tamsulosin (FLOMAX) 0.4 MG CAPS capsule Take 0.4 mg by mouth daily.    triamterene-hydrochlorothiazide (MAXZIDE-25) 37.5-25 MG per tablet Take 1 tablet by mouth daily.   No facility-administered encounter medications on file as of 06/23/2021.    Objective:   PHYSICAL EXAMINATION:    VITALS:   Vitals:   06/23/21 1445  BP: (!) 142/60  Pulse: 92  SpO2: 97%  Weight: 209 lb 1.6 oz (94.8 kg)    Wt Readings from Last 3 Encounters:  06/23/21 209 lb 1.6 oz (94.8 kg)  04/15/21 185 lb (83.9 kg)  02/07/21 182 lb (82.6 kg)    GEN:  The patient appears stated age and is in NAD. HEENT:  Normocephalic, atraumatic.  The mucous membranes are moist. The superficial temporal arteries are without ropiness or tenderness. CV:  RRR Lungs:  CTAB Neck/HEME:  There are no carotid bruits bilaterally. Exts:  pt has significant LE edema  Neurological examination:  Orientation: The patient is alert and oriented x3. Cranial nerves: There is good facial symmetry with min facial hypomimia. The speech is fluent and clear. Soft palate rises symmetrically and there is no tongue deviation. Hearing is intact to conversational tone. Sensation: Sensation is intact to light touch  throughout Motor: Strength is at least antigravity x4.  Movement examination: Tone: There is nl tone in the UE/LE. Abnormal movements: none seen today Coordination:  There is min decremation with finger taps bilaterally. Gait and Station: The patient has no difficulty arising out of a deep-seated chair without the use of the hands.  He has some mild start hesitation and then walks fairly well in the hallway, with a few shorter steps.  I have reviewed and interpreted the following labs independently    Chemistry      Component Value Date/Time   NA 140 06/18/2021 0927   K 4.2 06/18/2021 0927   CL 106 06/18/2021 0927   CO2 27 06/18/2021 0927  BUN 27 (H) 06/18/2021 0927   CREATININE 1.18 06/18/2021 0927      Component Value Date/Time   CALCIUM 9.0 06/18/2021 0927   ALKPHOS 62 10/25/2009 1404   AST 18 10/25/2009 1404   ALT 12 10/25/2009 1404   BILITOT 0.8 10/25/2009 1404       Lab Results  Component Value Date   WBC 9.6 06/18/2021   HGB 13.4 06/18/2021   HCT 42.0 06/18/2021   MCV 96.1 06/18/2021   PLT 344 06/18/2021    No results found for: TSH   Total time spent on today's visit was 36 minutes, including both face-to-face time and nonface-to-face time.  Time included that spent on review of records (prior notes available to me/labs/imaging if pertinent), discussing treatment and goals, answering patient's questions and coordinating care.  Cc:  Josetta Huddle, MD

## 2021-06-23 ENCOUNTER — Ambulatory Visit: Payer: Medicare Other | Admitting: Neurology

## 2021-06-23 ENCOUNTER — Telehealth: Payer: Self-pay

## 2021-06-23 ENCOUNTER — Other Ambulatory Visit: Payer: Self-pay

## 2021-06-23 ENCOUNTER — Encounter: Payer: Self-pay | Admitting: Neurology

## 2021-06-23 VITALS — BP 142/60 | HR 92 | Wt 209.1 lb

## 2021-06-23 DIAGNOSIS — R600 Localized edema: Secondary | ICD-10-CM

## 2021-06-23 DIAGNOSIS — R635 Abnormal weight gain: Secondary | ICD-10-CM

## 2021-06-23 DIAGNOSIS — G20 Parkinson's disease: Secondary | ICD-10-CM

## 2021-06-23 MED ORDER — ENTACAPONE 200 MG PO TABS: 200.0000 mg | ORAL_TABLET | Freq: Three times a day (TID) | ORAL | 2 refills | Status: DC

## 2021-06-23 MED ORDER — CARBIDOPA-LEVODOPA 25-100 MG PO TABS: ORAL_TABLET | ORAL | 1 refills | Status: DC

## 2021-06-23 NOTE — Patient Instructions (Addendum)
Take carbidopa/levodopa 25/100, 3 at 6am, 3 at 10am, 3 at 2pm, 3 at 6pm Add entacapone 200 mg, 1 tablet with first THREE dosages of levodopa

## 2021-06-23 NOTE — Telephone Encounter (Signed)
-----   Message from Jerline Pain, MD sent at 06/23/2021  3:54 PM EDT ----- Regarding: ADD on Please add Mr. Skubal onto my Tuesday, September 13 clinic at 10 AM.  Lower extremity edema. Thank you  Candee Furbish, MD

## 2021-06-23 NOTE — Telephone Encounter (Signed)
Called and spoke with pt wife.  She requested that OV be canceled pt will not be in town next week.  She is requesting an OV after 9/17.  Will route to MD and nurse to address.

## 2021-06-26 NOTE — Telephone Encounter (Signed)
Appt rescheduled to 9/21 at 8:40 am.  Pt is aware and agreeable.

## 2021-06-27 ENCOUNTER — Ambulatory Visit: Payer: Medicare Other | Admitting: Cardiology

## 2021-06-28 ENCOUNTER — Ambulatory Visit (HOSPITAL_BASED_OUTPATIENT_CLINIC_OR_DEPARTMENT_OTHER): Payer: Medicare Other | Admitting: General Practice

## 2021-07-05 ENCOUNTER — Other Ambulatory Visit: Payer: Self-pay

## 2021-07-05 ENCOUNTER — Ambulatory Visit: Payer: Medicare Other | Admitting: Cardiology

## 2021-07-05 ENCOUNTER — Encounter: Payer: Self-pay | Admitting: Cardiology

## 2021-07-05 VITALS — BP 110/72 | HR 59 | Ht 72.0 in | Wt 201.8 lb

## 2021-07-05 DIAGNOSIS — N401 Enlarged prostate with lower urinary tract symptoms: Secondary | ICD-10-CM | POA: Diagnosis not present

## 2021-07-05 DIAGNOSIS — R600 Localized edema: Secondary | ICD-10-CM

## 2021-07-05 DIAGNOSIS — N138 Other obstructive and reflux uropathy: Secondary | ICD-10-CM | POA: Diagnosis not present

## 2021-07-05 DIAGNOSIS — R9431 Abnormal electrocardiogram [ECG] [EKG]: Secondary | ICD-10-CM | POA: Diagnosis not present

## 2021-07-05 DIAGNOSIS — I444 Left anterior fascicular block: Secondary | ICD-10-CM | POA: Diagnosis not present

## 2021-07-05 DIAGNOSIS — E119 Type 2 diabetes mellitus without complications: Secondary | ICD-10-CM | POA: Diagnosis not present

## 2021-07-05 DIAGNOSIS — G20 Parkinson's disease: Secondary | ICD-10-CM | POA: Diagnosis not present

## 2021-07-05 DIAGNOSIS — I10 Essential (primary) hypertension: Secondary | ICD-10-CM | POA: Diagnosis not present

## 2021-07-05 NOTE — Assessment & Plan Note (Signed)
Checking echocardiogram especially with his increase in lower extremity edema.

## 2021-07-05 NOTE — Progress Notes (Signed)
Cardiology Office Note:    Date:  07/05/2021   ID:  [name], DOB September 14, 1946, MRN [id number]  PCP:  Josetta Huddle, MD   [facility] Providers Cardiologist:  None     Referring MD: Josetta Huddle, MD    History of Present Illness:    [name] is a 75 y.o. male here for follow-up of prior abnormal EKG hypertension hyperlipidemia with lower extremity edema.  He has Parkinson's followed by Dr. Carles Collet.  Recently had noted some lower extremity edema that was worsened.  Requested appointment.  LE edema since knee bilateral replacement >10 years ago. Has gotten worse. Tense  Prednisone off in June 2022 after long use. Hard for him. Got worse after stopping prednisone. Weight went down then up. On triamterene then dr. Inda Merlin changed to lasix 20mg  He increased to 60mg  QD.   Urinary retention. See urology. Has cath now.   Past Medical History:  Diagnosis Date   Allergic rhinitis    Arthritis    BPH (benign prostatic hyperplasia)    Diabetes ([place])    Dysphagia, pharyngeal phase    GERD diagnosed on barium swallow. Has small hiatal hernia. Symptomatically somewhat better on omeprazole but not entirely. We'll try b.i.d. therapy   Edema    1+ in both ankles, likely multifactorial including medication such as Requip   Erectile dysfunction    Staxyn 10 mg or Viagra worked well. 3 samples of Cialis 20 mg provided   GERD (gastroesophageal reflux disease)    Hypercholesteremia    Hypertension    Nephrolithiasis    Onychomycosis of toenail    April 27, 2013 - Dr. Inocencio Homes - podiatry, was in [place] - treating with oral Lamisil and topical nail therapy   Parkinson's disease [facility])     followed by Dr. Lezlie Octave at [facility] and [name], M.D. in [place]   Presbycusis    and tinnitus - Dr. Izora Gala - August/2013   Renal calculus    Syncope     Past Surgical History:  Procedure Laterality Date   HAND SURGERY     INGUINAL HERNIA REPAIR Left 11/04/2015   Procedure: LEFT INGUINAL  HERNIA REPAIR WITH MESH;  Surgeon: Armandina Gemma, MD;  Location: [place];  Service: General;  Laterality: Left;   INSERTION OF MESH Left 11/04/2015   Procedure: INSERTION OF MESH;  Surgeon: Armandina Gemma, MD;  Location: [place];  Service: General;  Laterality: Left;   JOINT REPLACEMENT Bilateral    KNEE SURGERY     TOTAL KNEE ARTHROPLASTY      Current Medications: Current Meds  Medication Sig   amantadine (SYMMETREL) 100 MG capsule Take 100 mg by mouth 3 (three) times daily.    amoxicillin (AMOXIL) 500 MG capsule Take 2,000 mg by mouth See admin instructions. Take 4 tablets (2000 mg) prior to dental appointments   aspirin 81 MG tablet Take 81 mg by mouth See admin instructions. Take 1 tablet (81 mg) Mon-Fri   carbidopa-levodopa (SINEMET IR) 25-100 MG tablet 3 at 6am, 3 at 10am, 3 at 2pm, 3 at 6pm   cholecalciferol (VITAMIN D) 1000 UNITS tablet Take 1,000 Units by mouth daily.   entacapone (COMTAN) 200 MG tablet Take 1 tablet (200 mg total) by mouth 3 (three) times daily. 1 tablet with first THREE dosages of levodopa   glimepiride (AMARYL) 4 MG tablet Take 4 mg by mouth daily with breakfast.   metFORMIN (GLUCOPHAGE) 1000 MG tablet Take 1,000 mg  by mouth 2 (two) times daily with a meal.    omeprazole (PRILOSEC) 20 MG capsule Take 20 mg by mouth daily as needed (acid reflux).   rOPINIRole (REQUIP) 3 MG tablet 1 in the AM, 1 in the afternoon, 1/2 tablet in the evening   Saw Palmetto 500 MG CAPS Take 1 capsule by mouth daily.   simvastatin (ZOCOR) 40 MG tablet Take 40 mg by mouth every evening.   tamsulosin (FLOMAX) 0.4 MG CAPS capsule Take 0.4 mg by mouth daily.      Allergies:   Oxycodone and Sudafed [pseudoephedrine hcl]   Social History   Socioeconomic History   Marital status: Married    Spouse name: Not on file   Number of children: Not on file   Years of education: Not on file   Highest education level: Not on file  Occupational History   Not on  file  Tobacco Use   Smoking status: Never   Smokeless tobacco: Never  Substance and Sexual Activity   Alcohol use: No   Drug use: No   Sexual activity: Not on file  Other Topics Concern   Not on file  Social History Narrative   Right Handed    Lives 3 story home   Social Determinants of Health   Financial Resource Strain: Not on file  Food Insecurity: Not on file  Transportation Needs: Not on file  Physical Activity: Not on file  Stress: Not on file  Social Connections: Not on file     Family History: The patient's family history includes Atrial fibrillation in his mother; Breast cancer in his mother; Cancer in his brother; Emphysema in his father; Heart disease in his father.  ROS:   Please see the history of present illness.     All other systems reviewed and are negative.  EKGs/Labs/Other Studies Reviewed:    The following studies were reviewed today:   EKG:  EKG is  ordered today.  The ekg ordered today demonstrates SR 59 LAFB  Recent Labs: 06/18/2021: BUN 27; Creatinine, Ser 1.18; Hemoglobin 13.4; Platelets 344; Potassium 4.2; Sodium 140  Recent Lipid Panel No results found for: CHOL, TRIG, HDL, CHOLHDL, VLDL, LDLCALC, LDLDIRECT   Risk Assessment/Calculations:          Physical Exam:    VS:  BP 110/72   Pulse (!) 59   Ht 6' (1.829 m)   Wt 201 lb 12.8 oz (91.5 kg)   SpO2 96%   BMI 27.37 kg/m     Wt Readings from Last 3 Encounters:  07/05/21 201 lb 12.8 oz (91.5 kg)  06/23/21 209 lb 1.6 oz (94.8 kg)  04/15/21 185 lb (83.9 kg)     GEN: Well nourished, well developed in no acute distress HEENT: Normal NECK: No JVD; No carotid bruits LYMPHATICS: No lymphadenopathy CARDIAC: RRR, no murmurs, rubs, gallops RESPIRATORY:  Clear to auscultation without rales, wheezing or rhonchi  ABDOMEN: Soft, non-tender, non-distended MUSCULOSKELETAL:  3+ tense BLE edema; No deformity  SKIN: Warm and dry NEUROLOGIC:  Alert and oriented x 3 PSYCHIATRIC:  Normal  affect   ASSESSMENT:    1. LAFB (left anterior fascicular block)   2. Localized edema   3. Benign prostatic hyperplasia with urinary obstruction   4. Parkinson's disease ([place])   5. Diabetes mellitus with coincident hypertension ([place])   6. Nonspecific abnormal electrocardiogram (ECG) (EKG)    PLAN:    In order of problems listed above:  Edema Chronic lower extremity edema that is worsened  since stopping prednisone.  He states that the edema has been present for over 10 years since his bilateral knee replacements.  He has increased his Lasix from 20 up to 60 daily.  Helping somewhat.  Still his legs are quite tense.  Bilateral.  We will go ahead and ask him to take 60 twice a day of Lasix and call us after 4 days.  After 4 days to go back to 60 once a day of Lasix.  If his legs have not dramatically improved we may end up continuing the 60 twice a day but will ask him to come in for a basic metabolic profile to ensure proper kidney function.  BPH (benign prostatic hyperplasia) Currently has a Foley catheter in place.  Urinary retention.  [facility]es [facility].  Parkinson's disease Appreciate Dr. Carles Collet reaching out regarding his lower extremity edema.  Diabetes mellitus with coincident hypertension [facility]) Managed closely by Dr. Inda Merlin.  Recently his triamterene hydrochlorothiazide was discontinued and Lasix was started to help with the lower extremity edema.  Blood pressure 110/72 this morning.  LAFB (left anterior fascicular block) Checking echocardiogram especially with his increase in lower extremity edema.          Medication Adjustments/Labs and Tests Ordered: Current medicines are reviewed at length with the patient today.  Concerns regarding medicines are outlined above.  Orders Placed This Encounter  Procedures   EKG 12-Lead   ECHOCARDIOGRAM COMPLETE    No orders of the defined types were placed in this encounter.   Patient Instructions  Medication Instructions:     FOR 4 DAYS ONLY: TAKE LASIX 60 MG TWICE A DAY   ( CONTACT  OUR CLINIC TRIAGE [phone] TO UPDATE HOW YOU ARE DOING WITH ANY CHANGES WITH LOWER EXTREMITY ( LEGS AND FEET) SWELLING)  THEN RESUME BACK TO:  LASIX 60 MG ONCE A DAY   *If you need a refill on your cardiac medications before your next appointment, please call your pharmacy*   Lab Work: NONE ORDERED  TODAY    If you have labs (blood work) drawn today and your tests are completely normal, you will receive your results only by: [employer] (if you have MyChart) OR A paper copy in the mail If you have any lab test that is abnormal or we need to change your treatment, we will call you to review the results.   Testing/Procedures: Your physician has requested that you have an echocardiogram. Echocardiography is a painless test that uses sound waves to create images of your heart. It provides your doctor with information about the size and shape of your heart and how well your heart's chambers and valves are working. This procedure takes approximately one hour. There are no restrictions for this procedure.   Follow-Up: At [facility], you and your health needs are our priority.  As part of our continuing mission to provide you with exceptional heart care, we have created designated Provider Care Teams.  These Care Teams include your primary Cardiologist (physician) and Advanced Practice Providers (APPs -  Physician Assistants and Nurse Practitioners) who all work together to provide you with the care you need, when you need it.  We recommend signing up for the patient portal called "MyChart".  Sign up information is provided on this After Visit Summary.  MyChart is used to connect with patients for Virtual Visits (Telemedicine).  Patients are able to view lab/test results, encounter notes, upcoming appointments, etc.  Non-urgent messages can be sent to your provider  as well.   To learn more about what you can do with MyChart, go  to [email or web address].    Your next appointment:   2 month(s)  The format for your next appointment:   In Person  Provider:   PER DR Marlou Porch 07-11-21 @ 8:20 AM  NOT 08-10-21   Other Instructions   Signed, Candee Furbish, MD  07/05/2021 11:00 AM    [place]

## 2021-07-05 NOTE — Assessment & Plan Note (Signed)
Managed closely by Dr. Inda Merlin.  Recently his triamterene hydrochlorothiazide was discontinued and Lasix was started to help with the lower extremity edema.  Blood pressure 110/72 this morning.

## 2021-07-05 NOTE — Assessment & Plan Note (Signed)
Chronic lower extremity edema that is worsened since stopping prednisone.  He states that the edema has been present for over 10 years since his bilateral knee replacements.  He has increased his Lasix from 20 up to 60 daily.  Helping somewhat.  Still his legs are quite tense.  Bilateral.  We will go ahead and ask him to take 60 twice a day of Lasix and call us after 4 days.  After 4 days to go back to 60 once a day of Lasix.  If his legs have not dramatically improved we may end up continuing the 60 twice a day but will ask him to come in for a basic metabolic profile to ensure proper kidney function.

## 2021-07-05 NOTE — Patient Instructions (Addendum)
Medication Instructions:    FOR 4 DAYS ONLY: TAKE LASIX 60 MG TWICE A DAY   ( CONTACT  OUR CLINIC TRIAGE [phone] TO UPDATE HOW YOU ARE DOING WITH ANY CHANGES WITH LOWER EXTREMITY ( LEGS AND FEET) SWELLING)  THEN RESUME BACK TO:  LASIX 60 MG ONCE A DAY   *If you need a refill on your cardiac medications before your next appointment, please call your pharmacy*   Lab Work: NONE ORDERED  TODAY    If you have labs (blood work) drawn today and your tests are completely normal, you will receive your results only by: [place] (if you have MyChart) OR A paper copy in the mail If you have any lab test that is abnormal or we need to change your treatment, we will call you to review the results.   Testing/Procedures: Your physician has requested that you have an echocardiogram. Echocardiography is a painless test that uses sound waves to create images of your heart. It provides your doctor with information about the size and shape of your heart and how well your heart's chambers and valves are working. This procedure takes approximately one hour. There are no restrictions for this procedure.   Follow-Up: At [facility], you and your health needs are our priority.  As part of our continuing mission to provide you with exceptional heart care, we have created designated Provider Care Teams.  These Care Teams include your primary Cardiologist (physician) and Advanced Practice Providers (APPs -  Physician Assistants and Nurse Practitioners) who all work together to provide you with the care you need, when you need it.  We recommend signing up for the patient portal called "MyChart".  Sign up information is provided on this After Visit Summary.  MyChart is used to connect with patients for Virtual Visits (Telemedicine).  Patients are able to view lab/test results, encounter notes, upcoming appointments, etc.  Non-urgent messages can be sent to your provider as well.   To learn more about  what you can do with MyChart, go to [email or web address].    Your next appointment:   2 month(s)  The format for your next appointment:   In Person  Provider:   PER DR SKAINS 07-11-21 @ 8:20 AM  NOT 08-10-21   Other Instructions

## 2021-07-05 NOTE — Assessment & Plan Note (Signed)
Currently has a Foley catheter in place.  Urinary retention.  Sees [facility].

## 2021-07-05 NOTE — Assessment & Plan Note (Signed)
Appreciate Dr. Carles Collet reaching out regarding his lower extremity edema.

## 2021-07-06 DIAGNOSIS — R338 Other retention of urine: Secondary | ICD-10-CM | POA: Diagnosis not present

## 2021-07-06 DIAGNOSIS — R3914 Feeling of incomplete bladder emptying: Secondary | ICD-10-CM | POA: Diagnosis not present

## 2021-07-07 NOTE — Progress Notes (Signed)
Cardiology Office Note:    Date:  07/11/2021   ID:  [name], DOB 12/29/1945, MRN [id number]  PCP:  Josetta Huddle, MD   [facility] Providers Cardiologist:  None     Referring MD: Josetta Huddle, MD    History of Present Illness:    [name] is a 75 y.o. male here for follow-up of lower extremity edema.  Recently he was seen last week with fairly significant lower extremity edema that it accumulated over quite some time.  His legs were taut.  We ended up giving him double the Lasix 60 mg twice a day.  His creatinine did increase from 1.18 up to 1.49 on 9/25.  We are rechecking.  He was in the emergency room for urinary retention on 9/25.  [facility].    He has Parkinson's disease which was followed by Dr. Carles Collet.  Recently,  had noted some lower extremity edema that worsened and became tense and requested an appointment. Had LE edema since knee bilateral replacement 10 years ago.  Off Prednisone since June 2022 and had been hard for him and became worse. His weight went down then went back up on Triamterene. Then Dr.Gates changed to Lasix 20 mg and increased to 60 mg.   He has urinary retention and has seen urology. He recently was admitted in the ED 07/09/2021 again for urinary retention. He reported he had a recent TURP by Dr.Bell. He also reported he was doing well until that afternoon to where he could not pass urine. He has a catheter.   Today, patient LE edema is showing slight improvement.He reports his breathing is doing fine.   He denies chest pains, shortness of breath, palpitations, headaches, vomiting, diarrhea, chest pressure or chest discomfort   Past Medical History:  Diagnosis Date   Allergic rhinitis    Arthritis    BPH (benign prostatic hyperplasia)    Diabetes ([place])    Dysphagia, pharyngeal phase    GERD diagnosed on barium swallow. Has small hiatal hernia. Symptomatically somewhat better on omeprazole but not entirely. We'll try b.i.d.  therapy   Edema    1+ in both ankles, likely multifactorial including medication such as Requip   Erectile dysfunction    Staxyn 10 mg or Viagra worked well. 3 samples of Cialis 20 mg provided   GERD (gastroesophageal reflux disease)    Hypercholesteremia    Hypertension    Nephrolithiasis    Onychomycosis of toenail    April 27, 2013 - Dr. Inocencio Homes - podiatry, was in [place] - treating with oral Lamisil and topical nail therapy   Parkinson's disease [facility])     followed by Dr. Lezlie Octave at [facility] and [name], M.D. in [place]   Presbycusis    and tinnitus - Dr. Izora Gala - August/2013   Renal calculus    Syncope     Past Surgical History:  Procedure Laterality Date   HAND SURGERY     INGUINAL HERNIA REPAIR Left 11/04/2015   Procedure: LEFT INGUINAL HERNIA REPAIR WITH MESH;  Surgeon: Armandina Gemma, MD;  Location: [place];  Service: General;  Laterality: Left;   INSERTION OF MESH Left 11/04/2015   Procedure: INSERTION OF MESH;  Surgeon: Armandina Gemma, MD;  Location: [place];  Service: General;  Laterality: Left;   JOINT REPLACEMENT Bilateral    KNEE SURGERY     TOTAL KNEE ARTHROPLASTY      Current Medications: Current  Meds  Medication Sig   amantadine (SYMMETREL) 100 MG capsule Take 100 mg by mouth 3 (three) times daily.    amoxicillin (AMOXIL) 500 MG capsule Take 2,000 mg by mouth See admin instructions. Take 4 tablets (2000 mg) prior to dental appointments   aspirin 81 MG tablet Take 81 mg by mouth See admin instructions. Take 1 tablet (81 mg) Mon-Fri   carbidopa-levodopa (SINEMET IR) 25-100 MG tablet 3 at 6am, 3 at 10am, 3 at 2pm, 3 at 6pm   cephALEXin (KEFLEX) 500 MG capsule Take 1 capsule (500 mg total) by mouth 4 (four) times daily.   cholecalciferol (VITAMIN D) 1000 UNITS tablet Take 1,000 Units by mouth daily.   doxycycline (VIBRA-TABS) 100 MG tablet Take 100 mg by mouth as needed.   entacapone (COMTAN) 200 MG tablet Take 1  tablet (200 mg total) by mouth 3 (three) times daily. 1 tablet with first THREE dosages of levodopa   furosemide (LASIX) 20 MG tablet Take 20 mg by mouth 3 (three) times daily.   glimepiride (AMARYL) 4 MG tablet Take 4 mg by mouth daily with breakfast.   GLOBAL EASE INJECT PEN NEEDLES 31G X 5 MM MISC Inject into the skin.   LANTUS SOLOSTAR 100 UNIT/ML Solostar Pen SMARTSIG:15 Unit(s) SUB-Q Every Evening   metFORMIN (GLUCOPHAGE) 1000 MG tablet Take 1,000 mg by mouth 2 (two) times daily with a meal.    omeprazole (PRILOSEC) 20 MG capsule Take 20 mg by mouth daily as needed (acid reflux).   potassium chloride SA (KLOR-CON) 20 MEQ tablet Take 20 mEq by mouth daily.   rOPINIRole (REQUIP) 3 MG tablet 1 in the AM, 1 in the afternoon, 1/2 tablet in the evening   Saw Palmetto 500 MG CAPS Take 1 capsule by mouth daily.   simvastatin (ZOCOR) 40 MG tablet Take 40 mg by mouth every evening.   sulfamethoxazole-trimethoprim (BACTRIM DS) 800-160 MG tablet Take 1 tablet by mouth 2 (two) times daily.   tamsulosin (FLOMAX) 0.4 MG CAPS capsule Take 0.4 mg by mouth daily.    triamterene-hydrochlorothiazide (MAXZIDE-25) 37.5-25 MG per tablet Take 1 tablet by mouth daily.     Allergies:   Oxycodone and Sudafed [pseudoephedrine hcl]   Social History   Socioeconomic History   Marital status: Married    Spouse name: Not on file   Number of children: Not on file   Years of education: Not on file   Highest education level: Not on file  Occupational History   Not on file  Tobacco Use   Smoking status: Never   Smokeless tobacco: Never  Substance and Sexual Activity   Alcohol use: No   Drug use: No   Sexual activity: Not on file  Other Topics Concern   Not on file  Social History Narrative   Right Handed    Lives 3 story home   Social Determinants of Health   Financial Resource Strain: Not on file  Food Insecurity: Not on file  Transportation Needs: Not on file  Physical Activity: Not on file   Stress: Not on file  Social Connections: Not on file     Family History: The patient's family history includes Atrial fibrillation in his mother; Breast cancer in his mother; Cancer in his brother; Emphysema in his father; Heart disease in his father.  ROS:   Please see the history of present illness.   All other systems reviewed and are negative.  EKGs/Labs/Other Studies Reviewed:    The following studies were reviewed today:  DVT 11/20:  Summary:  Right: No evidence of deep vein thrombosis in the lower extremity. No  indirect evidence of obstruction proximal to the inguinal ligament. No  cystic structure found in the popliteal fossa. There is suprapatellar  fluid seen with thickened synovium within.  There is also superficial edema seen throughout the mid and posterior  medial calf.  Left: No evidence of common femoral vein obstruction.   EKG:  07/11/2021: no EKG was ordered today. 07/05/2021: SR 59 LAFB  Recent Labs: 07/09/2021: BUN 26; Creatinine, Ser 1.49; Hemoglobin 14.0; Platelets 375; Potassium 4.0; Sodium 139  Recent Lipid Panel No results found for: CHOL, TRIG, HDL, CHOLHDL, VLDL, LDLCALC, LDLDIRECT   Risk Assessment/Calculations:          Physical Exam:    VS:  BP 116/68 (BP Location: Left Arm, Patient Position: Sitting, Cuff Size: Normal)   Pulse 74   Ht 6' (1.829 m)   Wt 201 lb (91.2 kg)   SpO2 96%   BMI 27.26 kg/m     Wt Readings from Last 3 Encounters:  07/11/21 201 lb (91.2 kg)  07/05/21 201 lb 12.8 oz (91.5 kg)  06/23/21 209 lb 1.6 oz (94.8 kg)     GEN: Well nourished, well developed in no acute distress HEENT: Normal NECK: No JVD; No carotid bruits LYMPHATICS: No lymphadenopathy CARDIAC: RRR, no murmurs, rubs, gallops RESPIRATORY:  Clear to auscultation without rales, wheezing or rhonchi  ABDOMEN: Soft, non-tender, non-distended MUSCULOSKELETAL:    3+ less tense BLE edema; No deformity  SKIN: Warm and dry NEUROLOGIC:  Alert and  oriented x 3 PSYCHIATRIC:  Normal affect   ASSESSMENT:    1. Localized edema   2. Medication management   3. Diabetes mellitus with coincident hypertension ([place])     PLAN:    Edema Fairly significant edema slightly improved.  Legs are less tense.  For now, lets continue with Lasix 60 mg twice a day.  His creatinine did jump slightly when he was checked in the ER on the 25th.  We are rechecking a basic metabolic profile today.  If his creatinine is markedly increased or abnormal, we need to readjust our plans on the Lasix.  Diabetes mellitus with coincident hypertension ([place]) Blood pressure under good control with the increased Lasix.  116/68 today.  No other changes made.  Continue with medication management for diabetes with Amaryl 4 mg daily and Glucophage 1000 mg twice a day.  In order of problems listed above:  FOLLOW UP IN October 10th, 2022.   Medication Adjustments/Labs and Tests Ordered: Current medicines are reviewed at length with the patient today.  Concerns regarding medicines are outlined above.  Orders Placed This Encounter  Procedures   Basic metabolic panel     No orders of the defined types were placed in this encounter.   Patient Instructions  Medication Instructions:  The current medical regimen is effective;  continue present plan and medications.  *If you need a refill on your cardiac medications before your next appointment, please call your pharmacy*  Lab Work: Please have blood work today. (BMP)  If you have labs (blood work) drawn today and your tests are completely normal, you will receive your results only by: [place] (if you have MyChart) OR A paper copy in the mail If you have any lab test that is abnormal or we need to change your treatment, we will call you to review the results.  Follow-Up: At [facility], you and your health needs  are our priority.  As part of our continuing mission to provide you with exceptional heart care,  we have created designated Provider Care Teams.  These Care Teams include your primary Cardiologist (physician) and Advanced Practice Providers (APPs -  Physician Assistants and Nurse Practitioners) who all work together to provide you with the care you need, when you need it.  We recommend signing up for the patient portal called "MyChart".  Sign up information is provided on this After Visit Summary.  MyChart is used to connect with patients for Virtual Visits (Telemedicine).  Patients are able to view lab/test results, encounter notes, upcoming appointments, etc.  Non-urgent messages can be sent to your provider as well.   To learn more about what you can do with MyChart, go to [email or web address].    Your next appointment:   As scheduled with Dr Marlou Porch.  Thank you for choosing [facility]!!     I,Jada Bradford,acting as a scribe for Candee Furbish, MD.,have documented all relevant documentation on the behalf of Candee Furbish, MD,as directed by  Candee Furbish, MD while in the presence of Candee Furbish, MD.  I, Candee Furbish, MD, have reviewed all documentation for this visit. The documentation on 07/11/21 for the exam, diagnosis, procedures, and orders are all accurate and complete.  Signed, Candee Furbish, MD  07/11/2021 9:21 AM    [facility]

## 2021-07-09 ENCOUNTER — Encounter (HOSPITAL_COMMUNITY): Payer: Self-pay

## 2021-07-09 ENCOUNTER — Emergency Department (HOSPITAL_COMMUNITY)
Admission: EM | Admit: 2021-07-09 | Discharge: 2021-07-09 | Disposition: A | Discharge status: Home / Self Care | Payer: Medicare Other | Attending: Emergency Medicine | Admitting: Emergency Medicine

## 2021-07-09 ENCOUNTER — Other Ambulatory Visit: Payer: Self-pay

## 2021-07-09 DIAGNOSIS — G20 Parkinson's disease: Secondary | ICD-10-CM | POA: Diagnosis not present

## 2021-07-09 DIAGNOSIS — Z79899 Other long term (current) drug therapy: Secondary | ICD-10-CM | POA: Insufficient documentation

## 2021-07-09 DIAGNOSIS — B9689 Other specified bacterial agents as the cause of diseases classified elsewhere: Secondary | ICD-10-CM | POA: Insufficient documentation

## 2021-07-09 DIAGNOSIS — R1030 Lower abdominal pain, unspecified: Secondary | ICD-10-CM | POA: Diagnosis present

## 2021-07-09 DIAGNOSIS — K219 Gastro-esophageal reflux disease without esophagitis: Secondary | ICD-10-CM | POA: Insufficient documentation

## 2021-07-09 DIAGNOSIS — Z7984 Long term (current) use of oral hypoglycemic drugs: Secondary | ICD-10-CM | POA: Insufficient documentation

## 2021-07-09 DIAGNOSIS — R339 Retention of urine, unspecified: Secondary | ICD-10-CM | POA: Diagnosis not present

## 2021-07-09 DIAGNOSIS — I10 Essential (primary) hypertension: Secondary | ICD-10-CM | POA: Diagnosis not present

## 2021-07-09 DIAGNOSIS — E119 Type 2 diabetes mellitus without complications: Secondary | ICD-10-CM | POA: Insufficient documentation

## 2021-07-09 DIAGNOSIS — N390 Urinary tract infection, site not specified: Secondary | ICD-10-CM | POA: Insufficient documentation

## 2021-07-09 DIAGNOSIS — Z7982 Long term (current) use of aspirin: Secondary | ICD-10-CM | POA: Insufficient documentation

## 2021-07-09 LAB — URINALYSIS, ROUTINE W REFLEX MICROSCOPIC
Bilirubin Urine: NEGATIVE
Glucose, UA: NEGATIVE mg/dL
Ketones, ur: NEGATIVE mg/dL
Nitrite: POSITIVE — AB
Protein, ur: NEGATIVE mg/dL
Specific Gravity, Urine: 1.01 (ref 1.005–1.030)
WBC, UA: >50 WBC/hpf — ABNORMAL HIGH (ref 0–5)
pH: 7 (ref 5.0–8.0)

## 2021-07-09 LAB — CBC WITH DIFFERENTIAL/PLATELET
Abs Immature Granulocytes: 0.02 10*3/uL (ref 0.00–0.07)
Basophils Absolute: 0 10*3/uL (ref 0.0–0.1)
Basophils Relative: 0 %
Eosinophils Absolute: 0.1 10*3/uL (ref 0.0–0.5)
Eosinophils Relative: 1 %
HCT: 44.3 % (ref 39.0–52.0)
Hemoglobin: 14 g/dL (ref 13.0–17.0)
Immature Granulocytes: 0 %
Lymphocytes Relative: 11 %
Lymphs Abs: 1.4 10*3/uL (ref 0.7–4.0)
MCH: 29.7 pg (ref 26.0–34.0)
MCHC: 31.6 g/dL (ref 30.0–36.0)
MCV: 93.9 fL (ref 80.0–100.0)
Monocytes Absolute: 0.9 10*3/uL (ref 0.1–1.0)
Monocytes Relative: 7 %
Neutro Abs: 10 10*3/uL — ABNORMAL HIGH (ref 1.7–7.7)
Neutrophils Relative %: 81 %
Platelets: 375 10*3/uL (ref 150–400)
RBC: 4.72 MIL/uL (ref 4.22–5.81)
RDW: 13.3 % (ref 11.5–15.5)
WBC: 12.4 10*3/uL — ABNORMAL HIGH (ref 4.0–10.5)
nRBC: 0 % (ref 0.0–0.2)

## 2021-07-09 LAB — BASIC METABOLIC PANEL
Anion gap: 15 (ref 5–15)
BUN: 26 mg/dL — ABNORMAL HIGH (ref 8–23)
CO2: 24 mmol/L (ref 22–32)
Calcium: 9.5 mg/dL (ref 8.9–10.3)
Chloride: 100 mmol/L (ref 98–111)
Creatinine, Ser: 1.49 mg/dL — ABNORMAL HIGH (ref 0.61–1.24)
GFR, Estimated: 49 mL/min — ABNORMAL LOW (ref >60)
Glucose, Bld: 184 mg/dL — ABNORMAL HIGH (ref 70–99)
Potassium: 4 mmol/L (ref 3.5–5.1)
Sodium: 139 mmol/L (ref 135–145)

## 2021-07-09 MED ORDER — CEPHALEXIN 500 MG PO CAPS
500.0000 mg | ORAL_CAPSULE | Freq: Once | ORAL | Status: AC
  Administered 2021-07-09: 500 mg via ORAL
  Filled 2021-07-09: qty 1

## 2021-07-09 MED ORDER — CEPHALEXIN 500 MG PO CAPS: 500.0000 mg | ORAL_CAPSULE | Freq: Four times a day (QID) | ORAL | 0 refills | Status: DC

## 2021-07-09 NOTE — ED Triage Notes (Signed)
Pt had a catheter removed Thursday and has been unable to urinate for the past few hours.

## 2021-07-09 NOTE — Discharge Instructions (Signed)
Your urine showed possible signs of infection.  Please pick up your antibiotics and complete.  Follow[facility]up with [facility].  Return if any worsening or concerning symptoms

## 2021-07-09 NOTE — ED Provider Notes (Signed)
[name] DEPT Provider Note   CSN: [id number] Arrival date & time: 07/09/21  1914     History Chief Complaint  Patient presents with   Urinary Retention    [name] is a 75 y.o. male.  He is here with a complaint of severe low abdominal pain and inability to pass urine.  He said he had a recent TURP by Dr. Gloriann Le.  Catheter just came out 4 days ago.  Had been doing well up until this afternoon when he could not pass urine.  No fevers or chills.  The history is provided by the patient.  Abdominal Pain Pain location:  Suprapubic Pain quality: aching   Pain radiates to:  Does not radiate Pain severity:  Severe Onset quality:  Gradual Duration:  4 hours Timing:  Constant Progression:  Worsening Chronicity:  Recurrent Context: not trauma   Relieved by:  Nothing Worsened by:  Nothing Ineffective treatments:  None tried Associated symptoms: no chest pain, no cough, no dysuria, no fever, no shortness of breath and no vomiting       Past Medical History:  Diagnosis Date   Allergic rhinitis    Arthritis    BPH (benign prostatic hyperplasia)    Diabetes ([facility])    Dysphagia, pharyngeal phase    GERD diagnosed on barium swallow. Has small hiatal hernia. Symptomatically somewhat better on omeprazole but not entirely. We'll try b.i.d. therapy   Edema    1+ in both ankles, likely multifactorial including medication such as Requip   Erectile dysfunction    Staxyn 10 mg or Viagra worked well. 3 samples of Cialis 20 mg provided   GERD (gastroesophageal reflux disease)    Hypercholesteremia    Hypertension    Nephrolithiasis    Onychomycosis of toenail    April 27, 2013 - Dr. Inocencio Le - podiatry, was in [place] - treating with oral Lamisil and topical nail therapy   Parkinson's disease [employer] [facility] [employer])     followed by Dr. Lezlie Le at [facility] and [name], M.D. in [facility] [place]   Presbycusis    and tinnitus - Dr. 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   Abnormal involuntary movement 02/18/2018   Encounter for general adult medical examination with abnormal findings 02/18/2018   Enlarged prostate 02/18/2018   Hearing loss 02/18/2018   Other long term (current) drug therapy 02/18/2018   Type 2 diabetes mellitus with hyperglycemia ([place]) 02/18/2018   Reducible left inguinal hernia 11/03/2015   Hypercholesteremia    Nephrolithiasis    Parkinson's disease ([place])    Diabetes mellitus with coincident hypertension ([place])    Syncope    Renal calculus    Diabetes ([place])    Allergic rhinitis    BPH (benign prostatic hyperplasia)    Erectile dysfunction    Dysphagia, pharyngeal phase    Edema    Presbycusis    Onychomycosis of toenail    Ureteral calculus 02/09/2013   LEG PAIN 03/14/2010    Past Surgical History:  Procedure Laterality Date   HAND SURGERY     INGUINAL HERNIA REPAIR Left 11/04/2015   Procedure: LEFT INGUINAL HERNIA REPAIR WITH MESH;  Surgeon: Armandina Gemma, MD;  Location: [place];  Service: General;  Laterality: Left;   INSERTION OF MESH Left 11/04/2015   Procedure: INSERTION OF MESH;  Surgeon:  Armandina Gemma, MD;  Location: [place];  Service: General;  Laterality: Left;   JOINT REPLACEMENT Bilateral    KNEE SURGERY     TOTAL KNEE ARTHROPLASTY         Family History  Problem Relation Age of Onset   Atrial fibrillation Mother    Breast cancer Mother    Emphysema Father    Heart disease Father    Cancer Brother        [place]    Social History   Tobacco Use   Smoking status: Never   Smokeless tobacco: Never  Substance Use Topics   Alcohol use: No   Drug use: No    Home Medications Prior to Admission medications   Medication Sig Start Date End Date Taking? Authorizing Provider  amantadine (SYMMETREL) 100 MG capsule Take 100 mg by mouth 3 (three)  times daily.     [provider]  amoxicillin (AMOXIL) 500 MG capsule Take 2,000 mg by mouth See admin instructions. Take 4 tablets (2000 mg) prior to dental appointments 12/19/17   [provider]  aspirin 81 MG tablet Take 81 mg by mouth See admin instructions. Take 1 tablet (81 mg) Mon-Fri    [provider]  carbidopa-levodopa (SINEMET IR) 25-100 MG tablet 3 at 6am, 3 at 10am, 3 at 2pm, 3 at 6pm 06/23/21   [name], [name], DO  cholecalciferol (VITAMIN D) 1000 UNITS tablet Take 1,000 Units by mouth daily.    [provider]  doxycycline (VIBRA-TABS) 100 MG tablet Take 100 mg by mouth as needed. 05/17/21   [provider]  entacapone (COMTAN) 200 MG tablet Take 1 tablet (200 mg total) by mouth 3 (three) times daily. 1 tablet with first THREE dosages of levodopa 06/23/21   [name], [name], DO  furosemide (LASIX) 20 MG tablet Take 20 mg by mouth 3 (three) times daily. 06/22/21   [provider]  glimepiride (AMARYL) 4 MG tablet Take 4 mg by mouth daily with breakfast.    [provider]  GLOBAL EASE INJECT PEN NEEDLES 31G X 5 MM MISC Inject into the skin. 05/09/21   [provider]  LANTUS SOLOSTAR 100 UNIT/ML Solostar Pen SMARTSIG:15 Unit(s) SUB-Q Every Evening 05/03/21   [provider]  metFORMIN (GLUCOPHAGE) 1000 MG tablet Take 1,000 mg by mouth 2 (two) times daily with a meal.     [provider]  omeprazole (PRILOSEC) 20 MG capsule Take 20 mg by mouth daily as needed (acid reflux).    [provider]  potassium chloride SA (KLOR-CON) 20 MEQ tablet Take 20 mEq by mouth daily. 06/16/21   [provider]  predniSONE (DELTASONE) 1 MG tablet Take 8 mg by mouth See admin instructions. Take 8 tablets (8 mg) for 1 week 04/07/21   [provider]  rOPINIRole (REQUIP) 3 MG tablet 1 in the AM, 1 in the afternoon, 1/2 tablet in the evening 02/07/21   [name], Rebecca S, DO  Saw Palmetto 500 MG CAPS Take 1 capsule  by mouth daily.    [provider]  simvastatin (ZOCOR) 40 MG tablet Take 40 mg by mouth every evening.    [provider]  simvastatin (ZOCOR) 40 MG tablet Take 40 mg by mouth at bedtime.    [provider]  sulfamethoxazole-trimethoprim (BACTRIM DS) 800-160 MG tablet Take 1 tablet by mouth 2 (two) times daily. Patient not taking: Reported on 07/05/2021 04/15/21   [provider]  tamsulosin [facility])  0.4 MG CAPS capsule Take 0.4 mg by mouth daily.  01/24/18   [provider]  triamterene-hydrochlorothiazide (MAXZIDE-25) 37.5-25 MG per tablet Take 1 tablet by mouth daily. Patient not taking: Reported on 07/05/2021    [provider]    Allergies    Oxycodone and Sudafed [pseudoephedrine hcl]  Review of Systems   Review of Systems  Constitutional:  Negative for fever.  Respiratory:  Negative for cough and shortness of breath.   Cardiovascular:  Negative for chest pain.  Gastrointestinal:  Positive for abdominal pain. Negative for vomiting.  Genitourinary:  Positive for difficulty urinating. Negative for dysuria.   Physical Exam Updated Vital Signs BP 121/62   Pulse 67   Temp 98 F (36.7 C) (Oral)   Resp 18   SpO2 93%   Physical Exam Vitals and nursing note reviewed.  Constitutional:      Appearance: Normal appearance. He is well-developed.  HENT:     Head: Normocephalic and atraumatic.  Eyes:     Conjunctiva/sclera: Conjunctivae normal.  Cardiovascular:     Rate and Rhythm: Normal rate and regular rhythm.     Heart sounds: No murmur heard. Pulmonary:     Effort: Pulmonary effort is normal. No respiratory distress.     Breath sounds: Normal breath sounds.  Abdominal:     Palpations: Abdomen is soft.     Tenderness: There is abdominal tenderness.  Musculoskeletal:     Cervical back: Neck supple.  Skin:    General: Skin is warm and dry.  Neurological:     General: No focal deficit present.     Mental Status: He is  alert.    ED Results / Procedures / Treatments   Labs (all labs ordered are listed, but only abnormal results are displayed) Labs Reviewed  BASIC METABOLIC PANEL - Abnormal; Notable for the following components:      Result Value   Glucose, Bld 184 (*)    BUN 26 (*)    Creatinine, Ser 1.49 (*)    GFR, Estimated 49 (*)    All other components within normal limits  CBC WITH DIFFERENTIAL/PLATELET - Abnormal; Notable for the following components:   WBC 12.4 (*)    Neutro Abs 10.0 (*)    All other components within normal limits  URINALYSIS, ROUTINE W REFLEX MICROSCOPIC - Abnormal; Notable for the following components:   Color, Urine YELLOW (*)    APPearance CLOUDY (*)    Hgb urine dipstick MODERATE (*)    Nitrite POSITIVE (*)    Leukocytes,Ua LARGE (*)    WBC, UA >50 (*)    Bacteria, UA RARE (*)    All other components within normal limits  URINE CULTURE    EKG None  Radiology No results found.  Procedures Procedures   Medications Ordered in ED Medications - No data to display  ED Course  I have reviewed the triage vital signs and the nursing notes.  Pertinent labs & imaging results that were available during my care of the patient were reviewed by me and considered in my medical decision making (see chart for details).  Clinical Course as of 07/10/21 1039  Sun Jul 09, 2021  2041 Patient feels improved after catheter placement.  Urinalysis concerning for infection.  Given dose of Keflex here and sent prescription to pharmacy.  He is comfortable with discharge.  I also discussed his case with Dr. Diona Fanti from [facility] urology and he will message Dr. Gloriann Le to arrange follow-up for patient. [MB]  Clinical Course User Index [MB] Hayden Rasmussen, MD   MDM Rules/Calculators/A&P                          This patient complains of unable to urinate and suprapubic abdominal pain; this involves an extensive number of treatment Options and is a complaint that carries  with it a high risk of complications and Morbidity. The differential includes urinary retention, UTI, diverticulitis, constipation  I ordered, reviewed and interpreted labs, which included CBC with elevated white count normal hemoglobin, chemistries with elevated glucose, elevation of creatinine, urinalysis with signs of infection greater than 50 whites nitrite positive I ordered medication oral Keflex Additional history obtained from patient's wife Previous records obtained and reviewed in epic including prior ED visits with urinary retention I consulted Dr. Diona Fanti [facility] and discussed lab and imaging findings  Critical Interventions: None  After the interventions stated above, I reevaluated the patient and found patient be symptomatically improved after catheter placement.  Discussed results of lab work and encouraged to drink more fluids.  Return instructions discussed with   Final Clinical Impression(s) / ED Diagnoses Final diagnoses:  Urinary retention  Lower urinary tract infectious disease    Rx / DC Orders ED Discharge Orders     None        Hayden Rasmussen, MD 07/10/21 1041

## 2021-07-11 ENCOUNTER — Ambulatory Visit: Payer: Medicare Other | Admitting: Cardiology

## 2021-07-11 ENCOUNTER — Encounter: Payer: Self-pay | Admitting: Cardiology

## 2021-07-11 ENCOUNTER — Other Ambulatory Visit: Payer: Self-pay

## 2021-07-11 VITALS — BP 116/68 | HR 74 | Ht 72.0 in | Wt 201.0 lb

## 2021-07-11 DIAGNOSIS — R600 Localized edema: Secondary | ICD-10-CM

## 2021-07-11 DIAGNOSIS — I10 Essential (primary) hypertension: Secondary | ICD-10-CM

## 2021-07-11 DIAGNOSIS — E119 Type 2 diabetes mellitus without complications: Secondary | ICD-10-CM

## 2021-07-11 DIAGNOSIS — Z79899 Other long term (current) drug therapy: Secondary | ICD-10-CM | POA: Diagnosis not present

## 2021-07-11 LAB — BASIC METABOLIC PANEL
BUN/Creatinine Ratio: 19 (ref 10–24)
BUN: 23 mg/dL (ref 8–27)
CO2: 26 mmol/L (ref 20–29)
Calcium: 9.2 mg/dL (ref 8.6–10.2)
Chloride: 100 mmol/L (ref 96–106)
Creatinine, Ser: 1.24 mg/dL (ref 0.76–1.27)
Glucose: 186 mg/dL — ABNORMAL HIGH (ref 70–99)
Potassium: 4.7 mmol/L (ref 3.5–5.2)
Sodium: 140 mmol/L (ref 134–144)
eGFR: 61 mL/min/{1.73_m2} (ref >59)

## 2021-07-11 NOTE — Assessment & Plan Note (Signed)
Blood pressure under good control with the increased Lasix.  116/68 today.  No other changes made.  Continue with medication management for diabetes with Amaryl 4 mg daily and Glucophage 1000 mg twice a day.

## 2021-07-11 NOTE — Patient Instructions (Signed)
Medication Instructions:  The current medical regimen is effective;  continue present plan and medications.  *If you need a refill on your cardiac medications before your next appointment, please call your pharmacy*  Lab Work: Please have blood work today. (BMP)  If you have labs (blood work) drawn today and your tests are completely normal, you will receive your results only by: [place] (if you have MyChart) OR A paper copy in the mail If you have any lab test that is abnormal or we need to change your treatment, we will call you to review the results.  Follow-Up: At [facility], you and your health needs are our priority.  As part of our continuing mission to provide you with exceptional heart care, we have created designated Provider Care Teams.  These Care Teams include your primary Cardiologist (physician) and Advanced Practice Providers (APPs -  Physician Assistants and Nurse Practitioners) who all work together to provide you with the care you need, when you need it.  We recommend signing up for the patient portal called "MyChart".  Sign up information is provided on this After Visit Summary.  MyChart is used to connect with patients for Virtual Visits (Telemedicine).  Patients are able to view lab/test results, encounter notes, upcoming appointments, etc.  Non-urgent messages can be sent to your provider as well.   To learn more about what you can do with MyChart, go to [email or web address].    Your next appointment:   As scheduled with Dr Marlou Porch.  Thank you for choosing [facility]!!

## 2021-07-11 NOTE — Assessment & Plan Note (Signed)
Fairly significant edema slightly improved.  Legs are less tense.  For now, lets continue with Lasix 60 mg twice a day.  His creatinine did jump slightly when he was checked in the ER on the 25th.  We are rechecking a basic metabolic profile today.  If his creatinine is markedly increased or abnormal, we need to readjust our plans on the Lasix.

## 2021-07-12 ENCOUNTER — Ambulatory Visit: Payer: Medicare Other | Admitting: Neurology

## 2021-07-12 LAB — URINE CULTURE: Culture: >=100000 — AB

## 2021-07-13 ENCOUNTER — Telehealth: Payer: Self-pay

## 2021-07-13 NOTE — Progress Notes (Signed)
ED Antimicrobial Stewardship Positive Culture Follow Up   [name] is an 75 y.o. male who presented to [facility] on 07/09/2021 with a chief complaint of urinary retention.   Chief Complaint  Patient presents with   Urinary Retention    Recent Results (from the past 720 hour(s))  Urine Culture     Status: Abnormal   Collection Time: 07/09/21  7:25 PM   Specimen: Urine, Catheterized  Result Value Ref Range Status   Specimen Description   Final    URINE, CATHETERIZED Performed at [facility] [address], [place], [place] [address]    Special Requests   Final    NONE Performed at [facility], [place] [address] [place], [place] [address]    Culture (A)  Final    >=100,000 COLONIES/mL CITROBACTER FREUNDII >=100,000 COLONIES/mL KLEBSIELLA PNEUMONIAE Confirmed Extended Spectrum Beta-Lactamase Producer (ESBL).  In bloodstream infections from ESBL organisms, carbapenems are preferred over piperacillin/tazobactam. They are shown to have a lower risk of mortality.    Report Status 07/12/2021 FINAL  Final   Organism ID, Bacteria CITROBACTER FREUNDII (A)  Final   Organism ID, Bacteria KLEBSIELLA PNEUMONIAE (A)  Final      Susceptibility   Citrobacter freundii - MIC*    CEFAZOLIN >=64 RESISTANT Resistant     CEFEPIME <=0.12 SENSITIVE Sensitive     CEFTRIAXONE <=0.25 SENSITIVE Sensitive     CIPROFLOXACIN <=0.25 SENSITIVE Sensitive     GENTAMICIN <=1 SENSITIVE Sensitive     IMIPENEM <=0.25 SENSITIVE Sensitive     NITROFURANTOIN <=16 SENSITIVE Sensitive     TRIMETH/SULFA <=20 SENSITIVE Sensitive     PIP/TAZO <=4 SENSITIVE Sensitive     * >=100,000 COLONIES/mL CITROBACTER FREUNDII   Klebsiella pneumoniae - MIC*    AMPICILLIN >=32 RESISTANT Resistant     CEFAZOLIN >=64 RESISTANT Resistant     CEFEPIME >=32 RESISTANT Resistant     CEFTRIAXONE >=64 RESISTANT Resistant     CIPROFLOXACIN 2 INTERMEDIATE Intermediate     GENTAMICIN >=16 RESISTANT  Resistant     IMIPENEM <=0.25 SENSITIVE Sensitive     NITROFURANTOIN 128 RESISTANT Resistant     TRIMETH/SULFA >=320 RESISTANT Resistant     AMPICILLIN/SULBACTAM >=32 RESISTANT Resistant     PIP/TAZO 16 SENSITIVE Sensitive     * >=100,000 COLONIES/mL KLEBSIELLA PNEUMONIAE   75 YO M with a recent TURP reported to the ED with complaints of urinary retention after catheter removal. He reported severe lower abdominal pain and the inability to pass any urine though denied any fevers, chills or urinary symptoms. After catheter placement in ED, patient reported feeling better. UA was collected and positive for nitrates, large leukocytes and > 50 WBC. Ucx showed > 100,000 colonies of Citrobacter freundii and > 100,000 colonies of ESBL Klebsiella pneumoniae concerns this may be a contaminated catch due to catheter.  Plan: - Call and perform a symptom check on patient, if still symptomatic may need to back for inpatient treatment due to resistance for oral treatment on Ucx - STOP Cephalexin 500 mg PO QID x 7 days  ED Provider: Domenic Moras, PA-C  Darcel Smalling, Student Pharmacist

## 2021-07-13 NOTE — Telephone Encounter (Signed)
Post ED Visit - Positive Culture Follow-up  Culture report reviewed by antimicrobial stewardship pharmacist: [facility] Team []  Elenor Quinones, Pharm.D. []  Heide Guile, Pharm.D., BCPS AQ-ID []  Parks Neptune, Pharm.D., BCPS []  Alycia Rossetti, Pharm.D., BCPS []  [place], Pharm.D., BCPS, AAHIVP []  Legrand Como, Pharm.D., BCPS, AAHIVP []  Salome Arnt, PharmD, BCPS []  Johnnette Gourd, PharmD, BCPS []  Hughes Better, PharmD, BCPS []  Leeroy Cha, PharmD []  Laqueta Linden, PharmD, BCPS []  Albertina Parr, PharmD  [facility] Team [x]  Leodis Sias, PharmD []  Lindell Spar, PharmD []  Royetta Asal, PharmD []  Graylin Shiver, Rph []  Rema Fendt) Glennon Mac, PharmD []  Arlyn Dunning, PharmD []  Netta Cedars, PharmD []  Dia Sitter, PharmD []  Leone Haven, PharmD []  Gretta Arab, PharmD []  Theodis Shove, PharmD []  Peggyann Juba, PharmD []  Reuel Boom, PharmD   Positive urine culture Treated with Cephalexin  Plan call and perform symptom check if continues to have symptoms then pt needs to be seen by a provider. Due to resistance to oral treatment on urine culture. Stop Cephalexin  Spoke with pt wife Izora Gala, pt is improving, pt saw his urologist yesterday and they changed his antibiotics. Advised to stop taking Cephalexin.   No further patient follow-up is required at this time.  Glennon Hamilton 07/13/2021, 10:48 AM

## 2021-07-14 ENCOUNTER — Ambulatory Visit: Payer: Medicare Other | Admitting: Neurology

## 2021-07-17 DIAGNOSIS — R338 Other retention of urine: Secondary | ICD-10-CM | POA: Diagnosis not present

## 2021-07-21 DIAGNOSIS — G20 Parkinson's disease: Secondary | ICD-10-CM

## 2021-07-24 ENCOUNTER — Other Ambulatory Visit: Payer: Self-pay

## 2021-07-24 ENCOUNTER — Encounter: Payer: Self-pay | Admitting: Cardiology

## 2021-07-24 ENCOUNTER — Ambulatory Visit: Payer: Medicare Other | Admitting: Cardiology

## 2021-07-24 ENCOUNTER — Ambulatory Visit (HOSPITAL_COMMUNITY): Payer: Medicare Other | Attending: Cardiovascular Disease

## 2021-07-24 VITALS — BP 110/60 | HR 70 | Ht 72.0 in | Wt 204.0 lb

## 2021-07-24 DIAGNOSIS — E7800 Pure hypercholesterolemia, unspecified: Secondary | ICD-10-CM | POA: Diagnosis not present

## 2021-07-24 DIAGNOSIS — R600 Localized edema: Secondary | ICD-10-CM

## 2021-07-24 DIAGNOSIS — G20 Parkinson's disease: Secondary | ICD-10-CM | POA: Diagnosis not present

## 2021-07-24 DIAGNOSIS — Z79899 Other long term (current) drug therapy: Secondary | ICD-10-CM | POA: Diagnosis not present

## 2021-07-24 DIAGNOSIS — R338 Other retention of urine: Secondary | ICD-10-CM | POA: Diagnosis not present

## 2021-07-24 DIAGNOSIS — R9431 Abnormal electrocardiogram [ECG] [EKG]: Secondary | ICD-10-CM | POA: Insufficient documentation

## 2021-07-24 LAB — ECHOCARDIOGRAM COMPLETE
Area-P 1/2: 3.39 cm2
Height: 72 in
S' Lateral: 3.6 cm
Weight: 3264 oz

## 2021-07-24 LAB — BASIC METABOLIC PANEL
BUN/Creatinine Ratio: 15 (ref 10–24)
BUN: 23 mg/dL (ref 8–27)
CO2: 25 mmol/L (ref 20–29)
Calcium: 9.2 mg/dL (ref 8.6–10.2)
Chloride: 99 mmol/L (ref 96–106)
Creatinine, Ser: 1.53 mg/dL — ABNORMAL HIGH (ref 0.76–1.27)
Glucose: 172 mg/dL — ABNORMAL HIGH (ref 70–99)
Potassium: 3.8 mmol/L (ref 3.5–5.2)
Sodium: 139 mmol/L (ref 134–144)
eGFR: 47 mL/min/{1.73_m2} — ABNORMAL LOW (ref >59)

## 2021-07-24 MED ORDER — FUROSEMIDE 20 MG PO TABS: 60.0000 mg | ORAL_TABLET | Freq: Two times a day (BID) | ORAL | 3 refills | Status: DC

## 2021-07-24 NOTE — Assessment & Plan Note (Signed)
Legs might be slightly improved but still quite edematous.  Continue with Lasix 60 mg twice a day.  We will check a basic metabolic profile today.  Creatinine has remained fairly stable.  I will also refer him to the vascular and vein specialists for any further suggestions.  He does recall that several years ago after his knee surgeries he did see them at 1 point. Checking echocardiogram today.

## 2021-07-24 NOTE — Progress Notes (Signed)
Cardiology Office Note:    Date:  07/24/2021   ID:  [name], DOB 27-Jun-1946, MRN [id number]  PCP:  Josetta Huddle, MD   [facility] Providers Cardiologist:  None     Referring MD: Josetta Huddle, MD     History of Present Illness:    [name] is a 75 y.o. male  here for follow-up of lower extremity edema.  He has been taking his Lasix 60 mg twice a day.  Still having some significant swelling.  Wears compression hose sometimes at night and his swelling is better during the morning hours.    Prior note: Recently he was seen last week with fairly significant lower extremity edema that it accumulated over quite some time.  His legs were taut.  We ended up giving him double the Lasix 60 mg twice a day.  His creatinine did increase from 1.18 up to 1.49 on 9/25.  We are rechecking.  He was in the emergency room for urinary retention on 9/25.  [facility] was [facility] by [facility].     He has Parkinson's disease which was followed by Dr. Carles Collet.  Recently,  had noted some lower extremity edema that worsened and became tense and requested an appointment. Had LE edema since knee bilateral replacement 10 years ago.   Off Prednisone since June 2022 and had been hard for him and became worse. His weight went down then went back up on Triamterene. Then Dr.Gates changed to Lasix 20 mg and increased to 60 mg.    He has urinary retention and has seen urology. He recently was admitted in the ED 07/09/2021 again for urinary retention. He reported he had a recent TURP by Dr.Bell. He also reported he was doing well until that afternoon to where he could not pass urine. He has a catheter.    Today, patient LE edema is showing slight improvement.He reports his breathing is doing fine.    He denies chest pains, shortness of breath, palpitations, headaches, vomiting, diarrhea, chest pressure or chest discomfort   Past Medical History:  Diagnosis Date   Allergic rhinitis    Arthritis    BPH (benign  prostatic hyperplasia)    Diabetes ([place])    Dysphagia, pharyngeal phase    GERD diagnosed on barium swallow. Has small hiatal hernia. Symptomatically somewhat better on omeprazole but not entirely. We'll try b.i.d. therapy   Edema    1+ in both ankles, likely multifactorial including medication such as Requip   Erectile dysfunction    Staxyn 10 mg or Viagra worked well. 3 samples of Cialis 20 mg provided   GERD (gastroesophageal reflux disease)    Hypercholesteremia    Hypertension    Nephrolithiasis    Onychomycosis of toenail    April 27, 2013 - Dr. Inocencio Homes - podiatry, was in [place] - treating with oral Lamisil and topical nail therapy   Parkinson's disease [facility])     followed by Dr. Lezlie Octave at [facility] and [name], M.D. in [place]   Presbycusis    and tinnitus - Dr. Izora Gala - August/2013   Renal calculus    Syncope     Past Surgical History:  Procedure Laterality Date   HAND SURGERY     INGUINAL HERNIA REPAIR Left 11/04/2015   Procedure: LEFT INGUINAL HERNIA REPAIR WITH MESH;  Surgeon: Armandina Gemma, MD;  Location: [place];  Service: General;  Laterality: Left;   INSERTION OF MESH Left 11/04/2015   Procedure:  INSERTION OF MESH;  Surgeon: Armandina Gemma, MD;  Location: [place];  Service: General;  Laterality: Left;   JOINT REPLACEMENT Bilateral    KNEE SURGERY     TOTAL KNEE ARTHROPLASTY      Current Medications: Current Meds  Medication Sig   amantadine (SYMMETREL) 100 MG capsule Take 100 mg by mouth 3 (three) times daily.    amoxicillin (AMOXIL) 500 MG capsule Take 2,000 mg by mouth See admin instructions. Take 4 tablets (2000 mg) prior to dental appointments   aspirin 81 MG tablet Take 81 mg by mouth See admin instructions. Take 1 tablet (81 mg) Mon-Fri   carbidopa-levodopa (SINEMET IR) 25-100 MG tablet 3 at 6am, 3 at 10am, 3 at 2pm, 3 at 6pm   cephALEXin (KEFLEX) 500 MG capsule Take 1 capsule (500 mg total) by mouth 4 (four)  times daily.   cholecalciferol (VITAMIN D) 1000 UNITS tablet Take 1,000 Units by mouth daily.   doxycycline (VIBRA-TABS) 100 MG tablet Take 100 mg by mouth as needed.   entacapone (COMTAN) 200 MG tablet Take 1 tablet (200 mg total) by mouth 3 (three) times daily. 1 tablet with first THREE dosages of levodopa   glimepiride (AMARYL) 4 MG tablet Take 4 mg by mouth daily with breakfast.   GLOBAL EASE INJECT PEN NEEDLES 31G X 5 MM MISC Inject into the skin.   LANTUS SOLOSTAR 100 UNIT/ML Solostar Pen SMARTSIG:15 Unit(s) SUB-Q Every Evening   metFORMIN (GLUCOPHAGE) 1000 MG tablet Take 1,000 mg by mouth 2 (two) times daily with a meal.    omeprazole (PRILOSEC) 20 MG capsule Take 20 mg by mouth daily as needed (acid reflux).   potassium chloride SA (KLOR-CON) 20 MEQ tablet Take 20 mEq by mouth daily.   rOPINIRole (REQUIP) 3 MG tablet 1 in the AM, 1 in the afternoon, 1/2 tablet in the evening   Saw Palmetto 500 MG CAPS Take 1 capsule by mouth daily.   simvastatin (ZOCOR) 40 MG tablet Take 40 mg by mouth every evening.   sulfamethoxazole-trimethoprim (BACTRIM DS) 800-160 MG tablet Take 1 tablet by mouth 2 (two) times daily.   tamsulosin (FLOMAX) 0.4 MG CAPS capsule Take 0.4 mg by mouth daily.    triamterene-hydrochlorothiazide (MAXZIDE-25) 37.5-25 MG per tablet Take 1 tablet by mouth daily.   [DISCONTINUED] furosemide (LASIX) 20 MG tablet Take 60 mg by mouth 2 (two) times daily.     Allergies:   Oxycodone and Sudafed [pseudoephedrine hcl]   Social History   Socioeconomic History   Marital status: Married    Spouse name: Not on file   Number of children: Not on file   Years of education: Not on file   Highest education level: Not on file  Occupational History   Not on file  Tobacco Use   Smoking status: Never   Smokeless tobacco: Never  Substance and Sexual Activity   Alcohol use: No   Drug use: No   Sexual activity: Not on file  Other Topics Concern   Not on file  Social History  Narrative   Right Handed    Lives 3 story home   Social Determinants of Health   Financial Resource Strain: Not on file  Food Insecurity: Not on file  Transportation Needs: Not on file  Physical Activity: Not on file  Stress: Not on file  Social Connections: Not on file     Family History: The patient's family history includes Atrial fibrillation in his mother; Breast cancer in his mother;  Cancer in his brother; Emphysema in his father; Heart disease in his father.  ROS:   Please see the history of present illness.     All other systems reviewed and are negative.  EKGs/Labs/Other Studies Reviewed:    The following studies were reviewed today:   DVT 11/20:  Summary:  Right: No evidence of deep vein thrombosis in the lower extremity. No  indirect evidence of obstruction proximal to the inguinal ligament. No  cystic structure found in the popliteal fossa. There is suprapatellar  fluid seen with thickened synovium within.  There is also superficial edema seen throughout the mid and posterior  medial calf.  Left: No evidence of common femoral vein obstruction.     Recent Labs: 07/09/2021: Hemoglobin 14.0; Platelets 375 07/11/2021: BUN 23; Creatinine, Ser 1.24; Potassium 4.7; Sodium 140  Recent Lipid Panel No results found for: CHOL, TRIG, HDL, CHOLHDL, VLDL, LDLCALC, LDLDIRECT   Risk Assessment/Calculations:          Physical Exam:    VS:  BP 110/60 (BP Location: Left Arm, Patient Position: Sitting, Cuff Size: Normal)   Pulse 70   Ht 6' (1.829 m)   Wt 204 lb (92.5 kg)   SpO2 96%   BMI 27.67 kg/m     Wt Readings from Last 3 Encounters:  07/24/21 204 lb (92.5 kg)  07/11/21 201 lb (91.2 kg)  07/05/21 201 lb 12.8 oz (91.5 kg)     GEN:  Well nourished, well developed in no acute distress HEENT: Normal NECK: No JVD; No carotid bruits LYMPHATICS: No lymphadenopathy CARDIAC: RRR, no murmurs, rubs, gallops RESPIRATORY:  Clear to auscultation without rales,  wheezing or rhonchi  ABDOMEN: Soft, non-tender, non-distended MUSCULOSKELETAL:  No edema; No deformity  SKIN: Warm and dry NEUROLOGIC:  Alert and oriented x 3 PSYCHIATRIC:  Normal affect   ASSESSMENT:    1. Medication management   2. Localized edema   3. Parkinson's disease ([place])   4. Hypercholesteremia    PLAN:    In order of problems listed above:  Edema Legs might be slightly improved but still quite edematous.  Continue with Lasix 60 mg twice a day.  We will check a basic metabolic profile today.  Creatinine has remained fairly stable.  I will also refer him to the vascular and vein specialists for any further suggestions.  He does recall that several years ago after his knee surgeries he did see them at 1 point. Checking echocardiogram today.  Parkinson's disease Medications reviewed.  Neurology following.  Hypercholesteremia Continue with simvastatin 40 mg in the evening.  Prior LDL 73.  ALT 3.  Creatinine 1.24.       Medication Adjustments/Labs and Tests Ordered: Current medicines are reviewed at length with the patient today.  Concerns regarding medicines are outlined above.  Orders Placed This Encounter  Procedures   Basic metabolic panel   Ambulatory referral to Vascular Surgery    Meds ordered this encounter  Medications   furosemide (LASIX) 20 MG tablet    Sig: Take 3 tablets (60 mg total) by mouth 2 (two) times daily.    Dispense:  540 tablet    Refill:  3     Patient Instructions  Medication Instructions:  Your physician recommends that you continue on your current medications as directed. Please refer to the Current Medication list given to you today.  *If you need a refill on your cardiac medications before your next appointment, please call your pharmacy*   Lab Work: BMET today  If you have labs (blood work) drawn today and your tests are completely normal, you will receive your results only by: [place] (if you have MyChart) OR A  paper copy in the mail If you have any lab test that is abnormal or we need to change your treatment, we will call you to review the results.   Follow-Up: At [facility], you and your health needs are our priority.  As part of our continuing mission to provide you with exceptional heart care, we have created designated Provider Care Teams.  These Care Teams include your primary Cardiologist (physician) and Advanced Practice Providers (APPs -  Physician Assistants and Nurse Practitioners) who all work together to provide you with the care you need, when you need it.   Your next appointment:   6 month(s)  The format for your next appointment:   In Person  Provider:   Candee Furbish, MD   Other Instructions You have been referred to Vein and Vascular.     Signed, Candee Furbish, MD  07/24/2021 10:20 AM    [facility]

## 2021-07-24 NOTE — Assessment & Plan Note (Signed)
Medications reviewed.  Neurology following.

## 2021-07-24 NOTE — Patient Instructions (Signed)
Medication Instructions:  Your physician recommends that you continue on your current medications as directed. Please refer to the Current Medication list given to you today.  *If you need a refill on your cardiac medications before your next appointment, please call your pharmacy*   Lab Work: BMET today If you have labs (blood work) drawn today and your tests are completely normal, you will receive your results only by: [place] (if you have MyChart) OR A paper copy in the mail If you have any lab test that is abnormal or we need to change your treatment, we will call you to review the results.   Follow-Up: At [facility], you and your health needs are our priority.  As part of our continuing mission to provide you with exceptional heart care, we have created designated Provider Care Teams.  These Care Teams include your primary Cardiologist (physician) and Advanced Practice Providers (APPs -  Physician Assistants and Nurse Practitioners) who all work together to provide you with the care you need, when you need it.   Your next appointment:   6 month(s)  The format for your next appointment:   In Person  Provider:   Candee Furbish, MD   Other Instructions You have been referred to Vein and Vascular.

## 2021-07-24 NOTE — Assessment & Plan Note (Signed)
Continue with simvastatin 40 mg in the evening.  Prior LDL 73.  ALT 3.  Creatinine 1.24.

## 2021-07-31 ENCOUNTER — Telehealth: Payer: Self-pay | Admitting: *Deleted

## 2021-07-31 DIAGNOSIS — Z79899 Other long term (current) drug therapy: Secondary | ICD-10-CM

## 2021-07-31 DIAGNOSIS — R600 Localized edema: Secondary | ICD-10-CM

## 2021-07-31 DIAGNOSIS — I10 Essential (primary) hypertension: Secondary | ICD-10-CM

## 2021-07-31 NOTE — Telephone Encounter (Signed)
Creatinine 1.53, 2 weeks ago 1.49, 3 months ago 1.22  Mild increase secondary to lasix 60 BID  OK to continue lasix 60 BID - still has significant LE edema which we are trying to help   Repeat BMET in one month.  Candee Furbish, MD   Called and left message for pt to repeat lab while he is in town 11/15 for another appt.  Lab order placed and appt scheduled.  Requested pt c/b if any questions/concerns or needs to reschedule.

## 2021-08-04 ENCOUNTER — Ambulatory Visit: Payer: Medicare Other | Admitting: Cardiology

## 2021-08-08 DIAGNOSIS — L821 Other seborrheic keratosis: Secondary | ICD-10-CM | POA: Diagnosis not present

## 2021-08-08 DIAGNOSIS — I8312 Varicose veins of left lower extremity with inflammation: Secondary | ICD-10-CM | POA: Diagnosis not present

## 2021-08-08 DIAGNOSIS — D485 Neoplasm of uncertain behavior of skin: Secondary | ICD-10-CM | POA: Diagnosis not present

## 2021-08-08 DIAGNOSIS — I8311 Varicose veins of right lower extremity with inflammation: Secondary | ICD-10-CM | POA: Diagnosis not present

## 2021-08-08 DIAGNOSIS — L570 Actinic keratosis: Secondary | ICD-10-CM | POA: Diagnosis not present

## 2021-08-08 DIAGNOSIS — L218 Other seborrheic dermatitis: Secondary | ICD-10-CM | POA: Diagnosis not present

## 2021-08-08 DIAGNOSIS — I872 Venous insufficiency (chronic) (peripheral): Secondary | ICD-10-CM | POA: Diagnosis not present

## 2021-08-08 DIAGNOSIS — L565 Disseminated superficial actinic porokeratosis (DSAP): Secondary | ICD-10-CM | POA: Diagnosis not present

## 2021-08-08 DIAGNOSIS — D225 Melanocytic nevi of trunk: Secondary | ICD-10-CM | POA: Diagnosis not present

## 2021-08-10 ENCOUNTER — Ambulatory Visit: Payer: Medicare Other | Admitting: Cardiology

## 2021-08-12 ENCOUNTER — Other Ambulatory Visit: Payer: Self-pay | Admitting: Neurology

## 2021-08-12 DIAGNOSIS — G20 Parkinson's disease: Secondary | ICD-10-CM

## 2021-08-12 DIAGNOSIS — G20A1 Parkinson's disease without dyskinesia, without mention of fluctuations: Secondary | ICD-10-CM

## 2021-08-14 ENCOUNTER — Other Ambulatory Visit: Payer: Self-pay

## 2021-08-14 DIAGNOSIS — G20 Parkinson's disease: Secondary | ICD-10-CM

## 2021-08-14 MED ORDER — ROPINIROLE HCL 3 MG PO TABS: ORAL_TABLET | ORAL | 1 refills | Status: DC

## 2021-08-17 ENCOUNTER — Encounter: Payer: Self-pay | Admitting: Physical Therapy

## 2021-08-17 ENCOUNTER — Ambulatory Visit: Payer: Medicare Other | Attending: Neurology | Admitting: Physical Therapy

## 2021-08-17 ENCOUNTER — Other Ambulatory Visit: Payer: Self-pay

## 2021-08-17 DIAGNOSIS — R2681 Unsteadiness on feet: Secondary | ICD-10-CM | POA: Insufficient documentation

## 2021-08-17 DIAGNOSIS — R2689 Other abnormalities of gait and mobility: Secondary | ICD-10-CM | POA: Insufficient documentation

## 2021-08-17 DIAGNOSIS — G20 Parkinson's disease: Secondary | ICD-10-CM | POA: Diagnosis not present

## 2021-08-17 DIAGNOSIS — R29818 Other symptoms and signs involving the nervous system: Secondary | ICD-10-CM | POA: Diagnosis not present

## 2021-08-17 NOTE — Therapy (Signed)
[facility] [address] [place], [name], [address] Phone: [phone]   Fax:  [phone]  Physical Therapy Evaluation  Patient Details  [name] MRN: [id number] Date of Birth: [birth year] Referring Provider (PT): Dr. Carles Collet   Encounter Date: 08/17/2021   PT End of Session - 08/17/21 1023     Visit Number 1    Number of Visits 17    Authorization Type UHC Medicare    PT Start Time 1022   pt late to eval   PT Stop Time 1100    PT Time Calculation (min) 38 min    Equipment Utilized During Treatment Gait belt    Activity Tolerance Patient tolerated treatment well    Behavior During Therapy WFL for tasks assessed/performed             Past Medical History:  Diagnosis Date   Allergic rhinitis    Arthritis    BPH (benign prostatic hyperplasia)    Diabetes ([place])    Dysphagia, pharyngeal phase    GERD diagnosed on barium swallow. Has small hiatal hernia. Symptomatically somewhat better on omeprazole but not entirely. We'll try b.i.d. therapy   Edema    1+ in both ankles, likely multifactorial including medication such as Requip   Erectile dysfunction    Staxyn 10 mg or Viagra worked well. 3 samples of Cialis 20 mg provided   GERD (gastroesophageal reflux disease)    Hypercholesteremia    Hypertension    Nephrolithiasis    Onychomycosis of toenail    April 27, 2013 - Dr. Inocencio Homes - podiatry, was in [place] - treating with oral Lamisil and topical nail therapy   Parkinson's disease [facility])     followed by Dr. Lezlie Octave at [facility] and [name], M.D. in [place]   Presbycusis    and tinnitus - Dr. Izora Gala - August/2013   Renal calculus    Syncope     Past Surgical History:  Procedure Laterality Date   HAND SURGERY     INGUINAL HERNIA REPAIR Left 11/04/2015   Procedure: LEFT INGUINAL HERNIA REPAIR WITH MESH;  Surgeon: Armandina Gemma, MD;  Location: [facility];  Service: General;   Laterality: Left;   INSERTION OF MESH Left 11/04/2015   Procedure: INSERTION OF MESH;  Surgeon: Armandina Gemma, MD;  Location: [facility];  Service: General;  Laterality: Left;   JOINT REPLACEMENT Bilateral    KNEE SURGERY     TOTAL KNEE ARTHROPLASTY      There were no vitals filed for this visit.    Subjective Assessment - 08/17/21 1024     Subjective Having more problems with his freezing. No falls. Feels more unsteady bending down in his garden. Primarily main problem is his walking. Had prostate surgery over the summer and have not been able to cycle (reports Dr. Carles Collet wants him to start doing this).    Pertinent History PMH: PD (diagnosed 2010 with symptoms to 2008), diabetes, chronic BLE edema (following up with cardiology), HTN, hx of bilat knee replacement, BPH.    Limitations Walking    Patient Stated Goals wants to be more normal in his walking.    Currently in Pain? No/denies                [facility] PT Assessment - 08/17/21 1029       Assessment   Medical Diagnosis PD    Referring Provider (PT) Dr. Carles Collet    Onset  Date/Surgical Date 07/24/21    Hand Dominance Right    Prior Therapy None      Precautions   Precautions Fall      Balance Screen   Has the patient fallen in the past 6 months No    Has the patient had a decrease in activity level because of a fear of falling?  Yes    Is the patient reluctant to leave their home because of a fear of falling?  No      Home Ecologist residence    Living Arrangements Spouse/significant other    Type of [place] to enter    Entrance Stairs-Number of Steps 5    Entrance Stairs-Rails Right    Home Layout Multi-level    Alternate Level Stairs-Number of Steps 12    Alternate Level Stairs-Rails [place] - single point   occassionally will use a cane   Additional Comments Reports more cautious on steps      Prior Function   Level of  Independence Independent    Vocation Full time employment    Vocation Requirements [employer]s [employer]    Leisure Used to bird hunt, fly fishing      Observation/Other Assessments-Edema    Edema --   chronic BLE edema (followed by cardiologist)     Sensation   Light Touch Appears Intact      Coordination   Gross Motor Movements are Fluid and Coordinated No    Heel Shin Test slower to perform with RLE      ROM / Strength   AROM / PROM / Strength Strength      Strength   Strength Assessment Site Hip;Knee;Ankle    Right/Left Hip Right;Left    Right Hip Flexion 4+/5    Left Hip Flexion 5/5    Right/Left Knee Right;Left    Right Knee Flexion 5/5    Right Knee Extension 5/5    Left Knee Flexion 5/5    Left Knee Extension 5/5    Right/Left Ankle Right;Left    Right Ankle Dorsiflexion 5/5    Left Ankle Dorsiflexion 5/5      Transfers   Transfers Sit to Stand;Stand to Sit    Sit to Stand 5: Supervision;Without upper extremity assist    Five time sit to stand comments  14.32 no UE support    Stand to Sit 5: Supervision;Without upper extremity assist    Comments When turning to sit down, pt needs cues to fully turn all the way and freezing of gait noted with turns      Ambulation/Gait   Ambulation/Gait Yes    Ambulation/Gait Assistance 5: Supervision    Ambulation/Gait Assistance Details Noted incr freezing episodes with dual tasking, turning, and when approaching a target/chair to sit down    Assistive device None    Gait Pattern Step-through pattern;Decreased step length - right;Decreased arm swing - left;Decreased dorsiflexion - right;Decreased dorsiflexion - left;Left foot flat;Right foot flat;Shuffle;Decreased trunk rotation    Ambulation Surface Level;Indoor    Gait velocity 9.25 seconds = 3.54 ft/sec      Standardized Balance Assessment   Standardized Balance Assessment Mini-BESTest;Timed Up and Go Test      Mini-BESTest   Sit To Stand Normal: Comes to stand  without use of hands and stabilizes independently.    Rise to Toes Moderate: Heels up, but not full range (smaller than when  holding hands), OR noticeable instability for 3 s.    Stand on one leg (left) Moderate: < 20 s   1.28, 3.10   Stand on one leg (right) Moderate: < 20 s   3.28, 3.33   Stand on one leg - lowest score 1    Compensatory Stepping Correction - Forward Normal: Recovers independently with a single, large step (second realignement is allowed).    Compensatory Stepping Correction - Backward Normal: Recovers independently with a single, large step    Compensatory Stepping Correction - Left Lateral Moderate: Several steps to recover equilibrium    Compensatory Stepping Correction - Right Lateral Severe:  Falls, or cannot step   needs therapist assist to prevent from falling   Stepping Corredtion Lateral - lowest score 0    Stance - Feet together, eyes open, firm surface  Normal: 30s    Stance - Feet together, eyes closed, foam surface  Moderate: < 30s   16 seconds   Incline - Eyes Closed Normal: Stands independently 30s and aligns with gravity    Change in Gait Speed Normal: Significantly changes walkling speed without imbalance    Walk with head turns - Horizontal Moderate: performs head turns with reduction in gait speed.    Walk with pivot turns Moderate:Turns with feet close SLOW (>4 steps) with good balance.    Step over obstacles Normal: Able to step over box with minimal change of gait speed and with good balance.    Timed UP & GO with Dual Task Moderate: Dual Task affects either counting OR walking (>10%) when compared to the TUG without Dual Task.    Mini-BEST total score 20      Timed Up and Go Test   Normal TUG (seconds) 11.21    Manual TUG (seconds) 13.5    Cognitive TUG (seconds) 12.81    TUG Comments freezing of gait, esp with dual tasking/turns                        Objective measurements completed on examination: See above findings.                 PT Education - 08/17/21 1058     Education Details Clinical findings, POC.    Person(s) Educated Patient    Methods Explanation    Comprehension Verbalized understanding              PT Short Term Goals - 08/17/21 1426       PT SHORT TERM GOAL #1   Title Pt will be independent with initial PD specific HEP in order to build upon functional gains made in therapy. ALL STGS DUE 09/14/21    Time 4    Period Weeks    Status New    Target Date 09/14/21      PT SHORT TERM GOAL #2   Title Pt will verbalize/demonstrate understanding of techniques/strategies to help with freezing episodes.    Time 4    Period Weeks    Status New      PT SHORT TERM GOAL #3   Title Pt will undergo further assessment of 6MWT with LTG written in order to demo improved endurance/community mobility.    Time 4    Period Weeks    Status New      PT SHORT TERM GOAL #4   Title Pt will recover lateral  balance in push and release test in 2 or less robust steps independently, for improved  balance recovery    Baseline to L = 3 steps, to R = 4 steps, needing therapist assist for balance.    Time 4    Period Weeks    Status New               PT Long Term Goals - 08/17/21 1428       PT LONG TERM GOAL #1   Title Pt will be independent with final PD specific HEP in order to build upon functional gains made in therapy. ALL LTGS DUE 10/12/21    Time 8    Period Weeks    Status New    Target Date 10/12/21      PT LONG TERM GOAL #2   Title 6MWT goal to be written in order to demo improved community mobility/endurance.    Time 8    Period Weeks    Status New      PT LONG TERM GOAL #3   Title Pt will improve miniBEST to at least a 23/28 in order to demo decr fall risk    Time 8    Period Weeks    Status New      PT LONG TERM GOAL #4   Title Pt will verbalize understanding of local Parkinson's disease resources, including options for continued community fitness after  D/C    Time 8    Period Weeks    Status New      PT LONG TERM GOAL #5   Title Pt will improve TUG and TUG cog score to less than or equal to 10% difference for improved dual task/decreased fall risk.    Time 8    Period Weeks    Status New                    Plan - 08/17/21 1430     Clinical Impression Statement Patient is a 75 year old male referred to Neuro OPPT for PD.   Pt's PMH is significant for: PD (diagnosed 2010 with symptoms to 2008), diabetes, chronic BLE edema, HTN, hx of bilat knee replacement, BPH. Pt has not yet received therapy for PD. The following deficits were present during the exam: impaired functional strength, impaired balance, decr timing/coordination of gait, freezing of gait, postural impairments.  Based on miniBEST, pt is an incr risk for falls. Based on TUG/cog TUG, pt with difficulties with dual tasking. Pt would benefit from skilled PT to address these impairments and functional limitations to maximize functional mobility independence and decr fall risk.    Personal Factors and Comorbidities Comorbidity 3+;Time since onset of injury/illness/exacerbation;Past/Current Experience    Comorbidities PD (diagnosed 2010 with symptoms to 2008), diabetes, chronic BLE edema, HTN, hx of bilat knee replacement, BPH    Examination-Activity Limitations Stairs;Locomotion Level    Examination-Participation Restrictions Community Activity   fishing, bird hunting   Stability/Clinical Decision Making Stable/Uncomplicated    Clinical Decision Making Low    Rehab Potential Good    PT Frequency 2x / week    PT Duration 12 weeks    PT Treatment/Interventions ADLs/Self Care Home Management;DME Instruction;Gait training;Stair training;Functional mobility training;Balance training;Therapeutic exercise;Therapeutic activities;Neuromuscular re-education;Patient/family education;Vestibular    PT Next Visit Plan initial HEP - begin with standing vs. seated PWR moves. 6MWT with goal  written. Strategies/techniques to help with freezing. STanding weight shifting. lateral stepping strategies.    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   Abnormal involuntary movement 02/18/2018   Encounter for general adult medical examination with abnormal findings 02/18/2018   Enlarged prostate 02/18/2018   Hearing loss 02/18/2018   Other long term (current) drug therapy 02/18/2018   Type 2 diabetes mellitus with hyperglycemia ([place]) 02/18/2018   Reducible left inguinal hernia 11/03/2015   Hypercholesteremia    Nephrolithiasis    Parkinson's disease ([place])    Diabetes mellitus with coincident hypertension ([place])    Syncope    Renal calculus    Diabetes ([place])    Allergic rhinitis    BPH (benign prostatic hyperplasia)    Erectile dysfunction    Dysphagia, pharyngeal phase    Edema    Presbycusis    Onychomycosis of toenail    Ureteral calculus 02/09/2013   LEG PAIN 03/14/2010    Arliss Journey, PT, DPT  08/17/2021, 2:34 PM  [facility] [address] [place], [name], [address] Phone: [phone]   Fax:  [phone]  [name] MRN: [id number] Date of Birth: 06/12/1946

## 2021-08-18 ENCOUNTER — Other Ambulatory Visit: Payer: Self-pay

## 2021-08-18 DIAGNOSIS — M7989 Other specified soft tissue disorders: Secondary | ICD-10-CM

## 2021-08-22 ENCOUNTER — Ambulatory Visit: Payer: Medicare Other | Admitting: Physical Therapy

## 2021-08-22 ENCOUNTER — Encounter: Payer: Self-pay | Admitting: Physical Therapy

## 2021-08-22 ENCOUNTER — Other Ambulatory Visit: Payer: Self-pay

## 2021-08-22 DIAGNOSIS — R29818 Other symptoms and signs involving the nervous system: Secondary | ICD-10-CM

## 2021-08-22 DIAGNOSIS — R2689 Other abnormalities of gait and mobility: Secondary | ICD-10-CM | POA: Diagnosis not present

## 2021-08-22 DIAGNOSIS — R2681 Unsteadiness on feet: Secondary | ICD-10-CM | POA: Diagnosis not present

## 2021-08-22 DIAGNOSIS — G20 Parkinson's disease: Secondary | ICD-10-CM | POA: Diagnosis not present

## 2021-08-22 NOTE — Therapy (Addendum)
[facility] [address] [place], [name], [address] Phone: [phone]   Fax:  [phone]  Physical Therapy Treatment  Patient Details  [name] MRN: [id number] Date of Birth: Jun 21, 1946 Referring Provider (PT): Dr. Carles Collet   Encounter Date: 08/22/2021   PT End of Session - 08/22/21 1622     Visit Number 2    Number of Visits 17    Date for PT Re-Evaluation 11/15/21    Authorization Type UHC Medicare    PT Start Time 1620    PT Stop Time 1700    PT Time Calculation (min) 40 min    Equipment Utilized During Treatment Gait belt    Activity Tolerance Patient tolerated treatment well    Behavior During Therapy WFL for tasks assessed/performed             Past Medical History:  Diagnosis Date   Allergic rhinitis    Arthritis    BPH (benign prostatic hyperplasia)    Diabetes ([place])    Dysphagia, pharyngeal phase    GERD diagnosed on barium swallow. Has small hiatal hernia. Symptomatically somewhat better on omeprazole but not entirely. We'll try b.i.d. therapy   Edema    1+ in both ankles, likely multifactorial including medication such as Requip   Erectile dysfunction    Staxyn 10 mg or Viagra worked well. 3 samples of Cialis 20 mg provided   GERD (gastroesophageal reflux disease)    Hypercholesteremia    Hypertension    Nephrolithiasis    Onychomycosis of toenail    April 27, 2013 - Dr. Inocencio Homes - podiatry, was in [place] - treating with oral Lamisil and topical nail therapy   Parkinson's disease [facility])     followed by [facility]. Lezlie Octave at [facility] and [name], M.D. in [place]   Presbycusis    and tinnitus - Dr. Izora Gala - August/2013   Renal calculus    Syncope     Past Surgical History:  Procedure Laterality Date   HAND SURGERY     INGUINAL HERNIA REPAIR Left 11/04/2015   Procedure: LEFT INGUINAL HERNIA REPAIR WITH MESH;  Surgeon: Armandina Gemma, MD;  Location: [facility];   Service: General;  Laterality: Left;   INSERTION OF MESH Left 11/04/2015   Procedure: INSERTION OF MESH;  Surgeon: Armandina Gemma, MD;  Location: [facility];  Service: General;  Laterality: Left;   JOINT REPLACEMENT Bilateral    KNEE SURGERY     TOTAL KNEE ARTHROPLASTY      There were no vitals filed for this visit.   Subjective Assessment - 08/22/21 1621     Subjective No changes, no falls.    Pertinent History PMH: PD (diagnosed 2010 with symptoms to 2008), diabetes, chronic BLE edema (following up with cardiology), HTN, hx of bilat knee replacement, BPH.    Limitations Walking    Patient Stated Goals wants to be more normal in his walking.    Currently in Pain? No/denies                [facility] PT Assessment - 08/22/21 1641       Ambulation/Gait   Ambulation/Gait Assistance Details After 6MWT - cued for slowed pace, incr effort with foot clearance and heel strike. Pt demonstrated an improved gait pattern and improved balance. Cued when initially standing to rock through hips and then take a big step to initiate gait as pt initially freezes upon standing before walking.  Ambulation Distance (Feet) 345 Feet   x1, 230 x 1   Gait Pattern Step-through pattern;Decreased step length - right;Decreased arm swing - left;Decreased dorsiflexion - right;Decreased dorsiflexion - left;Left foot flat;Right foot flat;Shuffle;Decreased trunk rotation;Trunk flexed    Gait velocity --                 Pt performs PWR! Moves in standing position at edge of mat table with chair in front as needed for balance.    PWR! Up for improved posture 2 x 10 reps, cues for scapular retraction   PWR! Rock for improved weighshifting x10 reps weight shifting through hips, then 2 x 10 reps with reaching and looking up at hands   PWR! Twist for improved trunk rotation x10 reps each side - needed chair for one instance for balance, cues to stand tall in the center.   PWR! Step for improved  step initiation x10 reps each side, difficulties with clearing feet.   Cues provided for technique, relation to function, and intensity. Pt will benefit from more practice before adding for home.          08/22/21 1623  6 Minute Walk- Baseline  6 Minute Walk- Baseline y  BP (mmHg) 130/80  HR (bpm) 52  02 Sat (%RA) 98 %  Modified Borg Scale for Dyspnea 0- Nothing at all  6 Minute walk- Post Test  6 Minute Walk Post Test y  BP (mmHg) 145/78  HR (bpm) 56  02 Sat (%RA) 100 %  Modified Borg Scale for Dyspnea 2- Mild shortness of breath  Perceived Rate of Exertion (Borg) 12-  6 minute walk test results   Aerobic Endurance Distance Walked 1420  Endurance additional comments 1st lap: 206'. Pt walks quickly onto his toes with shuffling/festinating pattern, forward flexed posture and decr foot clearance - needing min guard intermittently and one episode of min A. cues at times to slow down.                PT Education - 08/22/21 1712     Education Details Results of 6MWT, gait training, changing around appts due to scheduling conflicts later this week.    Person(s) Educated Patient    Methods Explanation;Demonstration    Comprehension Verbalized understanding;Returned demonstration              PT Short Term Goals - 08/22/21 2012       PT SHORT TERM GOAL #1   Title Pt will be independent with initial PD specific HEP in order to build upon functional gains made in therapy. ALL STGS DUE 09/14/21    Time 4    Period Weeks    Status New    Target Date 09/14/21      PT SHORT TERM GOAL #2   Title Pt will verbalize/demonstrate understanding of techniques/strategies to help with freezing episodes.    Time 4    Period Weeks    Status New      PT SHORT TERM GOAL #3   Title Pt will undergo further assessment of 6MWT with LTG written in order to demo improved endurance/community mobility.    Baseline 1420' with min guard needed at times due to shuffling/festination     Time 4    Period Weeks    Status Achieved      PT SHORT TERM GOAL #4   Title Pt will recover lateral  balance in push and release test in 2 or less robust steps independently, for improved balance  recovery    Baseline to L = 3 steps, to R = 4 steps, needing therapist assist for balance.    Time 4    Period Weeks    Status New               PT Long Term Goals - 08/22/21 2013       PT LONG TERM GOAL #1   Title Pt will be independent with final PD specific HEP in order to build upon functional gains made in therapy. ALL LTGS DUE 10/12/21    Time 8    Period Weeks    Status New      PT LONG TERM GOAL #2   Title Pt will ambulate at least 1470' in 6MWT with supervision with improved foot clearance/gait mechanics in order to demo improved safety and endurance for community mobility.    Baseline 1420' with min guard needed at times due to shuffling/festination and incr forward flexed posture    Time 8    Period Weeks    Status Revised      PT LONG TERM GOAL #3   Title Pt will improve miniBEST to at least a 23/28 in order to demo decr fall risk    Time 8    Period Weeks    Status New      PT LONG TERM GOAL #4   Title Pt will verbalize understanding of local Parkinson's disease resources, including options for continued community fitness after D/C    Time 8    Period Weeks    Status New      PT LONG TERM GOAL #5   Title Pt will improve TUG and TUG cog score to less than or equal to 10% difference for improved dual task/decreased fall risk.    Time 8    Period Weeks    Status New                   Plan - 08/22/21 2014     Clinical Impression Statement Performed 6MWT today with pt ambulating 1420'. Did need min guard at times for safety due to pt incr his gait speed with fesinating/shuffled gait pattern with decr foot clearance and incr forward flexed posture. Afterwards worked on Personnel officer with foot clearance/heel strike and slowing down pace. Pt did well  throughout remainder of session with these cues, will continue to benefit from more practice.    Personal Factors and Comorbidities Comorbidity 3+;Time since onset of injury/illness/exacerbation;Past/Current Experience    Comorbidities PD (diagnosed 2010 with symptoms to 2008), diabetes, chronic BLE edema, HTN, hx of bilat knee replacement, BPH    Examination-Activity Limitations Stairs;Locomotion Level    Examination-Participation Restrictions Community Activity   fishing, bird hunting   Stability/Clinical Decision Making Stable/Uncomplicated    Rehab Potential Good    PT Frequency 2x / week    PT Duration 12 weeks    PT Treatment/Interventions ADLs/Self Care Home Management;DME Instruction;Gait training;Stair training;Functional mobility training;Balance training;Therapeutic exercise;Therapeutic activities;Neuromuscular re-education;Patient/family education;Vestibular    PT Next Visit Plan initial HEP - practice standing PWR moves again and add to HEP with chair in front for safety. might wanna try seated PWR in future too.  gait training with posture/foot clearance and slowed pace. Strategies/techniques to help with freezing. STanding weight shifting. lateral stepping strategies.    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   Abnormal involuntary movement 02/18/2018   Encounter for general adult medical examination with abnormal findings 02/18/2018    Enlarged prostate 02/18/2018   Hearing loss 02/18/2018   Other long term (current) drug therapy 02/18/2018   Type 2 diabetes mellitus with hyperglycemia ([place]) 02/18/2018   Reducible left inguinal hernia 11/03/2015   Hypercholesteremia    Nephrolithiasis    Parkinson's disease ([place])    Diabetes mellitus with coincident hypertension ([place])    Syncope    Renal calculus    Diabetes ([place])    Allergic rhinitis    BPH (benign prostatic hyperplasia)    Erectile dysfunction    Dysphagia, pharyngeal phase    Edema    Presbycusis    Onychomycosis of toenail    Ureteral calculus 02/09/2013   LEG PAIN 03/14/2010    Arliss Journey, PT, DPT  08/22/2021, 8:16 PM  [facility] [address] [place], [name], [address] Phone: [phone]   Fax:  [phone]  [name] MRN: [id number] Date of Birth: [birth year]

## 2021-08-23 DIAGNOSIS — D485 Neoplasm of uncertain behavior of skin: Secondary | ICD-10-CM | POA: Diagnosis not present

## 2021-08-23 DIAGNOSIS — L988 Other specified disorders of the skin and subcutaneous tissue: Secondary | ICD-10-CM | POA: Diagnosis not present

## 2021-08-24 ENCOUNTER — Ambulatory Visit: Payer: Medicare Other

## 2021-08-25 ENCOUNTER — Ambulatory Visit: Payer: Self-pay

## 2021-08-29 ENCOUNTER — Encounter: Payer: Self-pay | Admitting: Vascular Surgery

## 2021-08-29 ENCOUNTER — Ambulatory Visit: Payer: Medicare Other | Admitting: Vascular Surgery

## 2021-08-29 ENCOUNTER — Other Ambulatory Visit: Payer: Self-pay

## 2021-08-29 ENCOUNTER — Ambulatory Visit (HOSPITAL_COMMUNITY)
Admission: RE | Admit: 2021-08-29 | Discharge: 2021-08-29 | Disposition: A | Discharge status: Home / Self Care | Payer: Medicare Other | Source: Ambulatory Visit | Attending: Vascular Surgery | Admitting: Vascular Surgery

## 2021-08-29 ENCOUNTER — Other Ambulatory Visit: Payer: Medicare Other

## 2021-08-29 DIAGNOSIS — M7989 Other specified soft tissue disorders: Secondary | ICD-10-CM | POA: Insufficient documentation

## 2021-08-29 DIAGNOSIS — I872 Venous insufficiency (chronic) (peripheral): Secondary | ICD-10-CM | POA: Diagnosis not present

## 2021-08-29 NOTE — Progress Notes (Signed)
Patient [name] MRN: [id number] DOB: 1946-09-11 Sex: male  REASON FOR CONSULT: Evaluate lower extremity leg swelling  HPI: [name] is a 75 y.o. male, with history of hypertension, hyperlipidemia, diabetes that presents for evaluation of lower extremity leg swelling.  Patient states he has had swelling for at least 10 years since he had his bilateral knee replacements.  He denies any history of DVTs.  He is followed by Dr. Marlou Porch with cardiology and states his Lasix dose has been increased to 120 mg a day.  This has been helpful with his swelling with increasing does.  He states at times swelling will get better at night when he elevates his legs but immediately during the day it gets worse.  He is wearing knee-high compression stockings.  No previous interventions  Past Medical History:  Diagnosis Date   Allergic rhinitis    Arthritis    BPH (benign prostatic hyperplasia)    Diabetes ([place])    Dysphagia, pharyngeal phase    GERD diagnosed on barium swallow. Has small hiatal hernia. Symptomatically somewhat better on omeprazole but not entirely. We'll try b.i.d. therapy   Edema    1+ in both ankles, likely multifactorial including medication such as Requip   Erectile dysfunction    Staxyn 10 mg or Viagra worked well. 3 samples of Cialis 20 mg provided   GERD (gastroesophageal reflux disease)    Hypercholesteremia    Hypertension    Nephrolithiasis    Onychomycosis of toenail    April 27, 2013 - Dr. Inocencio Homes - podiatry, was in [place] - treating with oral Lamisil and topical nail therapy   Parkinson's disease [facility])     followed by Dr. Lezlie Octave at [facility] and [name], M.D. in [place]   Presbycusis    and tinnitus - Dr. Izora Gala - August/2013   Renal calculus    Syncope     Past Surgical History:  Procedure Laterality Date   HAND SURGERY     INGUINAL HERNIA REPAIR Left 11/04/2015   Procedure: LEFT INGUINAL HERNIA REPAIR WITH MESH;  Surgeon: Armandina Gemma,  MD;  Location: [place];  Service: General;  Laterality: Left;   INSERTION OF MESH Left 11/04/2015   Procedure: INSERTION OF MESH;  Surgeon: Armandina Gemma, MD;  Location: [place];  Service: General;  Laterality: Left;   JOINT REPLACEMENT Bilateral    KNEE SURGERY     TOTAL KNEE ARTHROPLASTY      Family History  Problem Relation Age of Onset   Atrial fibrillation Mother    Breast cancer Mother    Emphysema Father    Heart disease Father    Cancer Brother        African Burkitt    SOCIAL HISTORY: Social History   Socioeconomic History   Marital status: Married    Spouse name: Not on file   Number of children: Not on file   Years of education: Not on file   Highest education level: Not on file  Occupational History   Not on file  Tobacco Use   Smoking status: Never   Smokeless tobacco: Never  Substance and Sexual Activity   Alcohol use: No   Drug use: No   Sexual activity: Not on file  Other Topics Concern   Not on file  Social History Narrative   Right Handed    Lives 3 story home   Social Determinants of Health   Financial Resource  Strain: Not on file  Food Insecurity: Not on file  Transportation Needs: Not on file  Physical Activity: Not on file  Stress: Not on file  Social Connections: Not on file  Intimate Partner Violence: Not on file    Allergies  Allergen Reactions   Oxycodone Itching    This makes him itch   Sudafed [Pseudoephedrine Hcl]     Problems urinating     Current Outpatient Medications  Medication Sig Dispense Refill   amantadine (SYMMETREL) 100 MG capsule Take 100 mg by mouth 3 (three) times daily.      amoxicillin (AMOXIL) 500 MG capsule Take 2,000 mg by mouth See admin instructions. Take 4 tablets (2000 mg) prior to dental appointments  99   aspirin 81 MG tablet Take 81 mg by mouth See admin instructions. Take 1 tablet (81 mg) Mon-Fri     carbidopa-levodopa (SINEMET IR) 25-100 MG tablet 3 at 6am, 3 at  10am, 3 at 2pm, 3 at 6pm 1080 tablet 1   cephALEXin (KEFLEX) 500 MG capsule Take 1 capsule (500 mg total) by mouth 4 (four) times daily. 28 capsule 0   cholecalciferol (VITAMIN D) 1000 UNITS tablet Take 1,000 Units by mouth daily.     doxycycline (VIBRA-TABS) 100 MG tablet Take 100 mg by mouth as needed. (Patient not taking: Reported on 08/17/2021)     entacapone (COMTAN) 200 MG tablet Take 1 tablet (200 mg total) by mouth 3 (three) times daily. 1 tablet with first THREE dosages of levodopa 270 tablet 2   furosemide (LASIX) 20 MG tablet Take 3 tablets (60 mg total) by mouth 2 (two) times daily. 540 tablet 3   glimepiride (AMARYL) 4 MG tablet Take 4 mg by mouth daily with breakfast.     GLOBAL EASE INJECT PEN NEEDLES 31G X 5 MM MISC Inject into the skin.     LANTUS SOLOSTAR 100 UNIT/ML Solostar Pen SMARTSIG:15 Unit(s) SUB-Q Every Evening     metFORMIN (GLUCOPHAGE) 1000 MG tablet Take 1,000 mg by mouth 2 (two) times daily with a meal.      omeprazole (PRILOSEC) 20 MG capsule Take 20 mg by mouth daily as needed (acid reflux).     potassium chloride SA (KLOR-CON) 20 MEQ tablet Take 20 mEq by mouth daily.     rOPINIRole (REQUIP) 3 MG tablet TAKE 1 TABLET BY MOUTH IN THE MORNING, 1 TABLET IN THE AFTERNOON, AND 1/2 TABLET IN THE EVENING. 215 tablet 1   rOPINIRole (REQUIP) 3 MG tablet 1 in the AM, 1 in the afternoon, 1/2 tablet in the evening 215 tablet 1   Saw Palmetto 500 MG CAPS Take 1 capsule by mouth daily.     simvastatin (ZOCOR) 40 MG tablet Take 40 mg by mouth every evening.     sulfamethoxazole-trimethoprim (BACTRIM DS) 800-160 MG tablet Take 1 tablet by mouth 2 (two) times daily.     tamsulosin (FLOMAX) 0.4 MG CAPS capsule Take 0.4 mg by mouth daily.   11   triamterene-hydrochlorothiazide (MAXZIDE-25) 37.5-25 MG per tablet Take 1 tablet by mouth daily.     No current facility-administered medications for this visit.    REVIEW OF SYSTEMS:  [X]  denotes positive finding, [ ]  denotes negative  finding Cardiac  Comments:  Chest pain or chest pressure:    Shortness of breath upon exertion:    Short of breath when lying flat:    Irregular heart rhythm:        Vascular    Pain in calf, thigh,  or hip brought on by ambulation:    Pain in feet at night that wakes you up from your sleep:     Blood clot in your veins:    Leg swelling:  x Bilateral      Pulmonary    Oxygen at home:    Productive cough:     Wheezing:         Neurologic    Sudden weakness in arms or legs:     Sudden numbness in arms or legs:     Sudden onset of difficulty speaking or slurred speech:    Temporary loss of vision in one eye:     Problems with dizziness:         Gastrointestinal    Blood in stool:     Vomited blood:         Genitourinary    Burning when urinating:     Blood in urine:        Psychiatric    Major depression:         Hematologic    Bleeding problems:    Problems with blood clotting too easily:        Skin    Rashes or ulcers:        Constitutional    Fever or chills:      PHYSICAL EXAM: Vitals:   08/29/21 1125  BP: 130/77  Pulse: 62  Resp: 16  Temp: 98.4 F (36.9 C)  TempSrc: Temporal  SpO2: 99%  Weight: 204 lb 11.2 oz (92.9 kg)  Height: 5\' 11"  (1.803 m)    GENERAL: The patient is a well-nourished male, in no acute distress. The vital signs are documented above. CARDIAC: There is a regular rate and rhythm.  VASCULAR:  Bilateral femoral pulses palpable Bilateral DP pulses palpable Significant bilateral lower extremity edema, no open ulcerations PULMONARY: No respiratory distress. ABDOMEN: Soft and non-tender. MUSCULOSKELETAL: There are no major deformities or cyanosis. NEUROLOGIC: No focal weakness or paresthesias are detected. SKIN: There are no ulcers or rashes noted. PSYCHIATRIC: The patient has a normal affect.  DATA:   Indications: Lower extremity edema, right greater than left. Patient  complains of edema since his total right knee replacement  approximately 10  years ago.     Performing Technologist: Ronal Fear RVS, RCS      Examination Guidelines: A complete evaluation includes B-mode imaging,  spectral  Doppler, color Doppler, and power Doppler as needed of all accessible  portions  of each vessel. Bilateral testing is considered an integral part of a  complete  examination. Limited examinations for reoccurring indications may be  performed  as noted. The reflux portion of the exam is performed with the patient in  reverse Trendelenburg.  Significant venous reflux is defined as >500 ms in the superficial venous  system, and >1 second in the deep venous system.      Venous Reflux Times  +--------------+---------+------+-----------+------------+--------+  RIGHT         Reflux NoRefluxReflux TimeDiameter cmsComments                          Yes                                   +--------------+---------+------+-----------+------------+--------+  CFV                     yes   >  1 second                       +--------------+---------+------+-----------+------------+--------+  FV mid                  yes   >1 second                       +--------------+---------+------+-----------+------------+--------+  Popliteal               yes   >1 second                       +--------------+---------+------+-----------+------------+--------+  GSV at [facility]    no                            0.47              +--------------+---------+------+-----------+------------+--------+  GSV prox thighno                            0.28              +--------------+---------+------+-----------+------------+--------+  GSV mid thigh no                            0.22              +--------------+---------+------+-----------+------------+--------+  GSV dist thighno                            0.29              +--------------+---------+------+-----------+------------+--------+  GSV  at knee   no                            0.21              +--------------+---------+------+-----------+------------+--------+  SSV Pop Fossa no                            0.25              +--------------+---------+------+-----------+------------+--------+        Summary:  Right:  - No evidence of deep vein thrombosis from the common femoral through the  popliteal veins.  - No evidence of superficial venous thrombosis.  - Significant reflux involving the deep venous system.  - The great and small saphenous veins are competent.     *See table(s) above for measurements and observations.   Assessment/Plan:  75 year old male presents with significant chronic bilateral lower extremity edema.  The right leg was evaluated with a venous reflux study today given that is the more symptomatic leg.  I discussed with the patient these findings that showed significant venous insufficiency throughout the deep venous system in the right leg.  His superficial venous system is competent as pictured above.  Unfortunately for deep venous reflux this is not amendable to laser ablation or other surgical intervention.  I discussed this would be conservative management.  Discussed we would offer leg elevation with compression and exercise.  He is currently wearing knee-high compression stockings and I offered to get him sized to a thigh-high stocking for more support and increase the rating to 20 to 30 mmHg.  I also discussed we could bring him back to evaluate the left leg with reflux study to see if there are are any options for intervention in the left leg.  Ultimately he wants to continue wearing the knee-high stockings and will further talk to Dr. Marlou Porch about his options moving forward.  Happy to see him in the future.   Marty Heck, MD [facility] Office: [phone]

## 2021-08-30 ENCOUNTER — Encounter: Payer: Self-pay | Admitting: Physical Therapy

## 2021-08-30 ENCOUNTER — Ambulatory Visit: Payer: Medicare Other | Admitting: Physical Therapy

## 2021-08-30 DIAGNOSIS — R2681 Unsteadiness on feet: Secondary | ICD-10-CM | POA: Diagnosis not present

## 2021-08-30 DIAGNOSIS — G20 Parkinson's disease: Secondary | ICD-10-CM | POA: Diagnosis not present

## 2021-08-30 DIAGNOSIS — R29818 Other symptoms and signs involving the nervous system: Secondary | ICD-10-CM | POA: Diagnosis not present

## 2021-08-30 DIAGNOSIS — R2689 Other abnormalities of gait and mobility: Secondary | ICD-10-CM

## 2021-08-30 NOTE — Therapy (Addendum)
[facility] [address] [place], [name], [address] Phone: [phone]   Fax:  [phone]  Physical Therapy Treatment  Patient Details  [name] MRN: [id number] Date of Birth: May 29, 1946 Referring Provider (PT): Dr. Carles Collet   Encounter Date: 08/30/2021   PT End of Session - 08/30/21 1621     Visit Number 3    Number of Visits 17    Date for PT Re-Evaluation 11/15/21    Authorization Type [employer] Medicare    PT Start Time 1620    PT Stop Time 1700    PT Time Calculation (min) 40 min    Equipment Utilized During Treatment Gait belt    Activity Tolerance Patient tolerated treatment well    Behavior During Therapy WFL for tasks assessed/performed             Past Medical History:  Diagnosis Date   Allergic rhinitis    Arthritis    BPH (benign prostatic hyperplasia)    Diabetes ([place])    Dysphagia, pharyngeal phase    GERD diagnosed on barium swallow. Has small hiatal hernia. Symptomatically somewhat better on omeprazole but not entirely. We'll try b.i.d. therapy   Edema    1+ in both ankles, likely multifactorial including medication such as Requip   Erectile dysfunction    Staxyn 10 mg or Viagra worked well. 3 samples of Cialis 20 mg provided   GERD (gastroesophageal reflux disease)    Hypercholesteremia    Hypertension    Nephrolithiasis    Onychomycosis of toenail    April 27, 2013 - Dr. Inocencio Homes - podiatry, was in [place] - treating with oral Lamisil and topical nail therapy   Parkinson's disease [facility])     followed by Dr. Lezlie Octave at [facility] and [name], M.D. in [place]   Presbycusis    and tinnitus - Dr. Izora Gala - August/2013   Renal calculus    Syncope     Past Surgical History:  Procedure Laterality Date   HAND SURGERY     INGUINAL HERNIA REPAIR Left 11/04/2015   Procedure: LEFT INGUINAL HERNIA REPAIR WITH MESH;  Surgeon: Armandina Gemma, MD;  Location: [place];  Service: General;  Laterality: Left;   INSERTION OF MESH Left 11/04/2015   Procedure: INSERTION OF MESH;  Surgeon: Armandina Gemma, MD;  Location: [place];  Service: General;  Laterality: Left;   JOINT REPLACEMENT Bilateral    KNEE SURGERY     TOTAL KNEE ARTHROPLASTY      There were no vitals filed for this visit.                      [facility] [facility] Adult PT Treatment/Exercise - 08/30/21 1642       Transfers   Comments Cues to turn all the way around to back up to mat table before sitting down.      Ambulation/Gait   Ambulation/Gait Yes    Ambulation/Gait Assistance 5: Supervision    Ambulation/Gait Assistance Details Reviewed cues from last session with more slowed pace focusing on foot clearance (esp with RLE) and step length.Cued to not hear feet scuff on the floor. Reviewed that initially when standing perform rocking and then take a big step to help initiate gait (pt reports he has been trying this at home and it has been helping).    Ambulation Distance (Feet) 230 Feet   x1   Assistive device None  Gait Pattern Step-through pattern;Decreased step length - right;Decreased arm swing - left;Decreased dorsiflexion - right;Decreased dorsiflexion - left;Left foot flat;Right foot flat;Shuffle;Decreased trunk rotation;Trunk flexed    Ambulation Surface Level;Indoor    Gait Comments When pt demonstrating a turn with gait, demonstrating more narrow BOS, shuffled steps. Over 30' working on wide U turns with incr stride length, wider BOS and leading with outside foot, performed x4 reps each direction to R and then L.                 Pt performs PWR! Moves in standing position at edge of mat table with chair in front as needed for balance.    PWR! Up for improved posture x10 reps, cues for scapular retraction    PWR! Rock for improved weighshifting - 2 x 10 reps with reaching and looking up at hands    PWR! Twist for improved trunk rotation x10 reps  each side - cues for tall posture in midline.    PWR! Step for improved step initiation x10 reps each side, difficulties with clearing feet. Performed x5 reps each side with stepping over obstacle for incr foot clearance, and then an additional x5 reps each side with yardsticks taken away with pt able to maintain step height. Discussed using UE support when performing this one at home for improved step height.    Cues provided for technique and relation to function. Provided handout for HEP. Discussed making sure that pt has a stable chair in front of him for balance as needed.     Balance Exercises - 08/30/21 1708       Balance Exercises: Standing   Stepping Strategy Anterior;10 reps;UE support;Limitations    Stepping Strategy Limitations Over 2" foam beam - cues for heel strike, performed with UE support>none, x10 reps each side.    Step Over Hurdles / Cones Stepping over 4 larger orange obstacles - cues for slowed pace, incr foot clearance, and hip/knee flexion. Down and back x4 reps.    Other Standing Exercises Working on quarter turns at countertop(performed in directions of faces of a clock, 9:00, 12:00, 3:00). Cued for incr foot clearance and wider BOS when stepping out with outside foot - x8 reps each direction.                PT Education - 08/30/21 1709     Education Details Standing PWR moves for HEP. Provided handout on strategies to assist with freezing episodes.    Person(s) Educated Patient    Methods Explanation;Demonstration;Handout    Comprehension Verbalized understanding;Returned demonstration              PT Short Term Goals - 08/22/21 2012       PT SHORT TERM GOAL #1   Title Pt will be independent with initial PD specific HEP in order to build upon functional gains made in therapy. ALL STGS DUE 09/14/21    Time 4    Period Weeks    Status New    Target Date 09/14/21      PT SHORT TERM GOAL #2   Title Pt will verbalize/demonstrate understanding of  techniques/strategies to help with freezing episodes.    Time 4    Period Weeks    Status New      PT SHORT TERM GOAL #3   Title Pt will undergo further assessment of 6MWT with LTG written in order to demo improved endurance/community mobility.    Baseline 1420' with min guard needed at times due  to shuffling/festination    Time 4    Period Weeks    Status Achieved      PT SHORT TERM GOAL #4   Title Pt will recover lateral  balance in push and release test in 2 or less robust steps independently, for improved balance recovery    Baseline to L = 3 steps, to R = 4 steps, needing therapist assist for balance.    Time 4    Period Weeks    Status New               PT Long Term Goals - 08/22/21 2013       PT LONG TERM GOAL #1   Title Pt will be independent with final PD specific HEP in order to build upon functional gains made in therapy. ALL LTGS DUE 10/12/21    Time 8    Period Weeks    Status New      PT LONG TERM GOAL #2   Title Pt will ambulate at least 1470' in 6MWT with supervision with improved foot clearance/gait mechanics in order to demo improved safety and endurance for community mobility.    Baseline 1420' with min guard needed at times due to shuffling/festination and incr forward flexed posture    Time 8    Period Weeks    Status Revised      PT LONG TERM GOAL #3   Title Pt will improve miniBEST to at least a 23/28 in order to demo decr fall risk    Time 8    Period Weeks    Status New      PT LONG TERM GOAL #4   Title Pt will verbalize understanding of local Parkinson's disease resources, including options for continued community fitness after D/C    Time 8    Period Weeks    Status New      PT LONG TERM GOAL #5   Title Pt will improve TUG and TUG cog score to less than or equal to 10% difference for improved dual task/decreased fall risk.    Time 8    Period Weeks    Status New                   Plan - 08/31/21 5035     Clinical  Impression Statement Performed standing PWR moves again this session and added to pt's HEP with pt performing well. Pt to use single UE support on chair when performing PWR Step for improved foot clearance. Reviewed gait training with more slowed pace and foot clearance and freezing strategies. Pt reports he has been using the technique of rocking to help re-initiate gait and pt reports that has been helping. Will continue to progress towards LTGs.    Personal Factors and Comorbidities Comorbidity 3+;Time since onset of injury/illness/exacerbation;Past/Current Experience    Comorbidities PD (diagnosed 2010 with symptoms to 2008), diabetes, chronic BLE edema, HTN, hx of bilat knee replacement, BPH    Examination-Activity Limitations Stairs;Locomotion Level    Examination-Participation Restrictions Community Activity   fishing, bird hunting   Stability/Clinical Decision Making Stable/Uncomplicated    Rehab Potential Good    PT Frequency 2x / week    PT Duration 12 weeks    PT Treatment/Interventions ADLs/Self Care Home Management;DME Instruction;Gait training;Stair training;Functional mobility training;Balance training;Therapeutic exercise;Therapeutic activities;Neuromuscular re-education;Patient/family education;Vestibular    PT Next Visit Plan review standing PWR moves again and add to HEP with chair in front for safety. might wanna try seated  PWR in future too.  gait training with posture/foot clearance and slowed pace. Review strategies/techniques to help with freezing. Lateral stepping strategies. SLS tasks. Working on turning.    PT Home Exercise Plan standing PWR.    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   Abnormal involuntary movement 02/18/2018   Encounter for general adult medical examination with abnormal findings 02/18/2018   Enlarged prostate 02/18/2018   Hearing loss 02/18/2018   Other long term (current) drug therapy 02/18/2018   Type 2 diabetes mellitus with hyperglycemia ([place]) 02/18/2018   Reducible left inguinal hernia 11/03/2015   Hypercholesteremia    Nephrolithiasis    Parkinson's disease ([place])    Diabetes mellitus with coincident hypertension ([place])    Syncope    Renal calculus    Diabetes ([place])    Allergic rhinitis    BPH (benign prostatic hyperplasia)    Erectile dysfunction    Dysphagia, pharyngeal phase    Edema    Presbycusis    Onychomycosis of toenail    Ureteral calculus 02/09/2013   LEG PAIN 03/14/2010    Arliss Journey, PT,DPT  08/31/2021, 8:57 AM  [facility] [address] [place], [name], [address] Phone: [phone]   Fax:  [phone]  [name] MRN: [id number] Date of Birth: January 25, 1946

## 2021-08-31 ENCOUNTER — Ambulatory Visit: Payer: Medicare Other

## 2021-09-01 DIAGNOSIS — E559 Vitamin D deficiency, unspecified: Secondary | ICD-10-CM | POA: Diagnosis not present

## 2021-09-01 DIAGNOSIS — N1831 Chronic kidney disease, stage 3a: Secondary | ICD-10-CM | POA: Diagnosis not present

## 2021-09-01 DIAGNOSIS — G20 Parkinson's disease: Secondary | ICD-10-CM | POA: Diagnosis not present

## 2021-09-01 DIAGNOSIS — Z79899 Other long term (current) drug therapy: Secondary | ICD-10-CM | POA: Diagnosis not present

## 2021-09-01 DIAGNOSIS — Z23 Encounter for immunization: Secondary | ICD-10-CM | POA: Diagnosis not present

## 2021-09-01 DIAGNOSIS — K219 Gastro-esophageal reflux disease without esophagitis: Secondary | ICD-10-CM | POA: Diagnosis not present

## 2021-09-01 DIAGNOSIS — N183 Chronic kidney disease, stage 3 unspecified: Secondary | ICD-10-CM | POA: Diagnosis not present

## 2021-09-01 DIAGNOSIS — Z0000 Encounter for general adult medical examination without abnormal findings: Secondary | ICD-10-CM | POA: Diagnosis not present

## 2021-09-01 DIAGNOSIS — E7800 Pure hypercholesterolemia, unspecified: Secondary | ICD-10-CM | POA: Diagnosis not present

## 2021-09-01 DIAGNOSIS — R1313 Dysphagia, pharyngeal phase: Secondary | ICD-10-CM | POA: Diagnosis not present

## 2021-09-01 DIAGNOSIS — I10 Essential (primary) hypertension: Secondary | ICD-10-CM | POA: Diagnosis not present

## 2021-09-01 DIAGNOSIS — Z7984 Long term (current) use of oral hypoglycemic drugs: Secondary | ICD-10-CM | POA: Diagnosis not present

## 2021-09-01 DIAGNOSIS — E1165 Type 2 diabetes mellitus with hyperglycemia: Secondary | ICD-10-CM | POA: Diagnosis not present

## 2021-09-04 ENCOUNTER — Encounter: Payer: Self-pay | Admitting: Neurology

## 2021-09-04 ENCOUNTER — Ambulatory Visit: Payer: Medicare Other

## 2021-09-05 ENCOUNTER — Ambulatory Visit: Payer: Medicare Other | Admitting: Physical Therapy

## 2021-09-06 DIAGNOSIS — R600 Localized edema: Secondary | ICD-10-CM | POA: Diagnosis not present

## 2021-09-13 DIAGNOSIS — H0100A Unspecified blepharitis right eye, upper and lower eyelids: Secondary | ICD-10-CM | POA: Diagnosis not present

## 2021-09-14 ENCOUNTER — Ambulatory Visit: Payer: Medicare Other

## 2021-09-14 DIAGNOSIS — R3914 Feeling of incomplete bladder emptying: Secondary | ICD-10-CM | POA: Diagnosis not present

## 2021-09-14 DIAGNOSIS — N3000 Acute cystitis without hematuria: Secondary | ICD-10-CM | POA: Diagnosis not present

## 2021-09-14 DIAGNOSIS — R8271 Bacteriuria: Secondary | ICD-10-CM | POA: Diagnosis not present

## 2021-09-18 ENCOUNTER — Telehealth: Payer: Self-pay | Admitting: Neurology

## 2021-09-18 NOTE — Telephone Encounter (Signed)
Note that we called pt Friday to offer him work in appt for today. Pt said had a lot going on but may come in.  Appt was held for patient but never called back to accept it.

## 2021-09-19 ENCOUNTER — Ambulatory Visit: Payer: Medicare Other | Admitting: Physical Therapy

## 2021-09-19 DIAGNOSIS — R3914 Feeling of incomplete bladder emptying: Secondary | ICD-10-CM | POA: Diagnosis not present

## 2021-09-19 DIAGNOSIS — N200 Calculus of kidney: Secondary | ICD-10-CM | POA: Diagnosis not present

## 2021-09-19 DIAGNOSIS — Z87440 Personal history of urinary (tract) infections: Secondary | ICD-10-CM | POA: Diagnosis not present

## 2021-09-19 DIAGNOSIS — N3000 Acute cystitis without hematuria: Secondary | ICD-10-CM | POA: Diagnosis not present

## 2021-09-19 DIAGNOSIS — K8020 Calculus of gallbladder without cholecystitis without obstruction: Secondary | ICD-10-CM | POA: Diagnosis not present

## 2021-09-21 ENCOUNTER — Ambulatory Visit: Payer: Medicare Other | Attending: Internal Medicine

## 2021-09-21 ENCOUNTER — Other Ambulatory Visit: Payer: Self-pay

## 2021-09-21 DIAGNOSIS — R29818 Other symptoms and signs involving the nervous system: Secondary | ICD-10-CM | POA: Insufficient documentation

## 2021-09-21 DIAGNOSIS — R2689 Other abnormalities of gait and mobility: Secondary | ICD-10-CM | POA: Insufficient documentation

## 2021-09-21 DIAGNOSIS — R2681 Unsteadiness on feet: Secondary | ICD-10-CM | POA: Diagnosis not present

## 2021-09-21 NOTE — Therapy (Signed)
[facility] [address] [place], [name], [address] Phone: [phone]   Fax:  [phone]  Physical Therapy Treatment  Patient Details  [name] MRN: [id number] Date of Birth: 1946/08/05 Referring Provider (PT): Dr. Carles Collet   Encounter Date: 09/21/2021   PT End of Session - 09/21/21 1603     Visit Number 4    Number of Visits 17    Date for PT Re-Evaluation 11/15/21    Authorization Type UHC Medicare    PT Start Time 6270    PT Stop Time 1530    PT Time Calculation (min) 45 min    Equipment Utilized During Treatment Gait belt    Activity Tolerance Patient tolerated treatment well    Behavior During Therapy WFL for tasks assessed/performed             Past Medical History:  Diagnosis Date   Allergic rhinitis    Arthritis    BPH (benign prostatic hyperplasia)    Diabetes ([place])    Dysphagia, pharyngeal phase    GERD diagnosed on barium swallow. Has small hiatal hernia. Symptomatically somewhat better on omeprazole but not entirely. We'll try b.i.d. therapy   Edema    1+ in both ankles, likely multifactorial including medication such as Requip   Erectile dysfunction    Staxyn 10 mg or Viagra worked well. 3 samples of Cialis 20 mg provided   GERD (gastroesophageal reflux disease)    Hypercholesteremia    Hypertension    Nephrolithiasis    Onychomycosis of toenail    April 27, 2013 - Dr. Inocencio Homes - podiatry, was in [place] - treating with oral Lamisil and topical nail therapy   Parkinson's disease [facility])     followed by Dr. Lezlie Octave at [facility] and [name], M.D. in [place]   Presbycusis    and tinnitus - Dr. Izora Gala - August/2013   Renal calculus    Syncope     Past Surgical History:  Procedure Laterality Date   HAND SURGERY     INGUINAL HERNIA REPAIR Left 11/04/2015   Procedure: LEFT INGUINAL HERNIA REPAIR WITH MESH;  Surgeon: Armandina Gemma, MD;  Location: [facility];   Service: General;  Laterality: Left;   INSERTION OF MESH Left 11/04/2015   Procedure: INSERTION OF MESH;  Surgeon: Armandina Gemma, MD;  Location: [facility];  Service: General;  Laterality: Left;   JOINT REPLACEMENT Bilateral    KNEE SURGERY     TOTAL KNEE ARTHROPLASTY      There were no vitals filed for this visit.   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 Pt reports that this causes him to be tired and wiped out. Pt reports not being able to do his HEP other than a couple of times.    Pertinent History PMH: PD (diagnosed 2010 with symptoms to 2008), diabetes, chronic BLE edema (following up with cardiology), HTN, hx of bilat knee replacement, BPH.    Limitations Walking    Patient Stated Goals wants to be more normal in his walking.    Currently in Pain? No/denies                               [facility] PT Treatment/Exercise - 09/21/21 1548  Transfers   Transfers Sit to Stand;Stand to Sit    Sit to Stand 5: Supervision;Without upper extremity assist    Stand to Sit 5: Supervision;Without upper extremity assist      Ambulation/Gait   Ambulation/Gait Yes    Ambulation/Gait Assistance 5: Supervision    Ambulation/Gait Assistance Details Cues to take big steps and increase arm swing    Ambulation Distance (Feet) 345 Feet    Assistive device None    Gait Pattern Step-through pattern;Decreased step length - right;Decreased arm swing - left;Decreased dorsiflexion - right;Decreased dorsiflexion - left;Left foot flat;Right foot flat;Shuffle;Decreased trunk rotation;Trunk flexed    Ambulation Surface Level;Indoor      Therapeutic Activites    Therapeutic Activities Other Therapeutic Activities    Other Therapeutic Activities PT reviewed pt's PWR! HEP as well as strategies to help w/ freezing. PT reprinted pt's HEP.      Neuro Re-ed    Neuro  Re-ed Details  PWR! Described Below. PT assessed pt's lateral stepping strategty. Pt required 2 steps and no PT assist to the left but required 4 steps and PT assist to keep from falling on the right. At counter w/ no UE support pt performed 3 laps of reciprocal walking over low hurdles. Pt had occasional loss of balance but was able to self-correct. PT provided min guard and verbal cues for big steps. Pt performed 3 laps of side steps over hurdles at counter w/ single UE support. PT provided min guard and verbal cues for upright posture and big steps. Pt demonstrated increase difficulty w/ steps w/ the right and w/ turns. Pt performed 2x10 BLE each lateral steps towards targets on floor w/o UE assist. PT provided min guard and verbal cues for technique.            Pt performs PWR! Moves in standing position at edge of mat table with chair in front as needed for balance.    PWR! Up for improved posture 2 x 10 reps, cues for scapular retraction    PWR! Rock for improved weighshifting - 2 x 10 reps with reaching and looking up at hands    PWR! Twist for improved trunk rotation 2 x 10 reps each side - cues for rotation at the hips for more of a twist motion instead of a stepping motion.   PWR! Step for improved step initiation 2 x 10 reps each side, difficulties with clearing feet especially on the R.   Cues provided for technique and relation to function. Provided handout for HEP. Discussed making sure that pt has a stable chair in front of him for balance as needed.            PT Short Term Goals - 09/21/21 1608       PT SHORT TERM GOAL #1   Title Pt will be independent with initial PD specific HEP in order to build upon functional gains made in therapy. ALL STGS DUE 09/14/21    Baseline 09/21/21- Pt reports not doing HEP consistently as he has been dealing w/ chronic UTI.    Time 4    Period Weeks    Status On-going    Target Date 09/14/21      PT SHORT TERM GOAL #2   Title Pt  will verbalize/demonstrate understanding of techniques/strategies to help with freezing episodes.    Time 4    Period Weeks    Status Achieved      PT SHORT TERM GOAL #3   Title Pt  will undergo further assessment of 6MWT with LTG written in order to demo improved endurance/community mobility.    Baseline 1420' with min guard needed at times due to shuffling/festination    Time 4    Period Weeks    Status Achieved      PT SHORT TERM GOAL #4   Title Pt will recover lateral  balance in push and release test in 2 or less robust steps independently, for improved balance recovery    Baseline to L = 3 steps, to R = 4 steps, needing therapist assist for balance. 09/21/21- L= 3 steps, R= 4 steps, needing therapist assist for balance    Time 4    Period Weeks    Status Partially Met               PT Long Term Goals - 08/22/21 2013       PT LONG TERM GOAL #1   Title Pt will be independent with final PD specific HEP in order to build upon functional gains made in therapy. ALL LTGS DUE 10/12/21    Time 8    Period Weeks    Status New      PT LONG TERM GOAL #2   Title Pt will ambulate at least 1470' in 6MWT with supervision with improved foot clearance/gait mechanics in order to demo improved safety and endurance for community mobility.    Baseline 1420' with min guard needed at times due to shuffling/festination and incr forward flexed posture    Time 8    Period Weeks    Status Revised      PT LONG TERM GOAL #3   Title Pt will improve miniBEST to at least a 23/28 in order to demo decr fall risk    Time 8    Period Weeks    Status New      PT LONG TERM GOAL #4   Title Pt will verbalize understanding of local Parkinson's disease resources, including options for continued community fitness after D/C    Time 8    Period Weeks    Status New      PT LONG TERM GOAL #5   Title Pt will improve TUG and TUG cog score to less than or equal to 10% difference for improved dual  task/decreased fall risk.    Time 8    Period Weeks    Status New                   Plan - 09/21/21 1605     Clinical Impression Statement Today's PT session focused on checking STG. Pt has partially met his goals due to inconsistencies in visits as pt is dealing w/ a chronic UTI. Pt was able to achieve completeing 6MWT demonstrating improved endurance. Pt still demonstrates deficits in gait needing cues to avoid festination/freezing during ambulation. Pt demonstrates improvement in balance requiring only 2 steps w/o PT assistance to catch his balance laterally on his L. Pt still has increased difficutly in stepping laterally to his right to regain balance requiring 4 steps and PT assistance to maintain balance. Pt was able to verbalize and demonstrate strategies to help w/ freezing for improvements in gait. PT reprinted and reducated pt on HEP focused on big movements as pt states he has not been able to due them as much due to illness. PT will continue to progress pt as tolerated w/ POC towards pt achieving LTG.    Personal Factors and Comorbidities Comorbidity 3+;Time  since onset of injury/illness/exacerbation;Past/Current Experience    Comorbidities PD (diagnosed 2010 with symptoms to 2008), diabetes, chronic BLE edema, HTN, hx of bilat knee replacement, BPH    Examination-Activity Limitations Stairs;Locomotion Level    Examination-Participation Restrictions Community Activity   fishing, bird hunting   Stability/Clinical Decision Making Stable/Uncomplicated    Rehab Potential Good    PT Frequency 2x / week    PT Duration 12 weeks    PT Treatment/Interventions ADLs/Self Care Home Management;DME Instruction;Gait training;Stair training;Functional mobility training;Balance training;Therapeutic exercise;Therapeutic activities;Neuromuscular re-education;Patient/family education;Vestibular    PT Next Visit Plan Pt wants to try stairs w/o handrails, review standing PWR moves again and add to  HEP with chair in front for safety. might wanna try seated PWR in future too.  gait training with posture/foot clearance and slowed pace. Review strategies/techniques to help with freezing. Lateral stepping strategies. SLS tasks. Working on turning.    PT Home Exercise Plan standing PWR.    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   Abnormal involuntary movement 02/18/2018   Encounter for general adult medical examination with abnormal findings 02/18/2018   Enlarged prostate 02/18/2018   Hearing loss 02/18/2018   Other long term (current) drug therapy 02/18/2018   Type 2 diabetes mellitus with hyperglycemia ([place]) 02/18/2018   Reducible left inguinal hernia 11/03/2015   Hypercholesteremia    Nephrolithiasis    Parkinson's disease ([place])    Diabetes mellitus with coincident hypertension ([place])    Syncope    Renal calculus    Diabetes ([place])    Allergic rhinitis    BPH (benign prostatic hyperplasia)    Erectile dysfunction    Dysphagia, pharyngeal phase    Edema    Presbycusis    Onychomycosis of toenail    Ureteral calculus 02/09/2013   LEG PAIN 03/14/2010    Lottie Mussel, Student-PT 09/21/2021, 4:20 PM  [facility] [address] [place], [name], [address] Phone: [phone]   Fax:  [phone]  [name] MRN: [id number] Date of Birth: Jul 02, 1946

## 2021-09-22 ENCOUNTER — Other Ambulatory Visit: Payer: Self-pay | Admitting: Neurology

## 2021-09-26 ENCOUNTER — Encounter: Payer: Self-pay | Admitting: Physical Therapy

## 2021-09-26 ENCOUNTER — Ambulatory Visit: Payer: Medicare Other | Admitting: Physical Therapy

## 2021-09-26 ENCOUNTER — Other Ambulatory Visit: Payer: Self-pay

## 2021-09-26 DIAGNOSIS — R2689 Other abnormalities of gait and mobility: Secondary | ICD-10-CM

## 2021-09-26 DIAGNOSIS — R29818 Other symptoms and signs involving the nervous system: Secondary | ICD-10-CM

## 2021-09-26 DIAGNOSIS — D49511 Neoplasm of unspecified behavior of right kidney: Secondary | ICD-10-CM | POA: Diagnosis not present

## 2021-09-26 DIAGNOSIS — R2681 Unsteadiness on feet: Secondary | ICD-10-CM

## 2021-09-26 DIAGNOSIS — R3914 Feeling of incomplete bladder emptying: Secondary | ICD-10-CM | POA: Diagnosis not present

## 2021-09-26 DIAGNOSIS — N3000 Acute cystitis without hematuria: Secondary | ICD-10-CM | POA: Diagnosis not present

## 2021-09-26 NOTE — Therapy (Signed)
[facility] [address] [place], [name], [address] Phone: [phone]   Fax:  [phone]  Physical Therapy Treatment  Patient Details  [name] MRN: [id number] Date of Birth: [birth year] Referring Provider (PT): Dr. Carles Collet   Encounter Date: 09/26/2021   PT End of Session - 09/26/21 1452     Visit Number 5    Number of Visits 17    Date for PT Re-Evaluation 11/15/21    Authorization Type [employer]HC Medicare    PT Start Time 1449    PT Stop Time 1529    PT Time Calculation (min) 40 min    Equipment Utilized During Treatment --    Activity Tolerance Patient tolerated treatment well    Behavior During Therapy WFL for tasks assessed/performed             Past Medical History:  Diagnosis Date   Allergic rhinitis    Arthritis    BPH (benign prostatic hyperplasia)    Diabetes ([place])    Dysphagia, pharyngeal phase    GERD diagnosed on barium swallow. Has small hiatal hernia. Symptomatically somewhat better on omeprazole but not entirely. We'll try b.i.d. therapy   Edema    1+ in both ankles, likely multifactorial including medication such as Requip   Erectile dysfunction    Staxyn 10 mg or Viagra worked well. 3 samples of Cialis 20 mg provided   GERD (gastroesophageal reflux disease)    Hypercholesteremia    Hypertension    Nephrolithiasis    Onychomycosis of toenail    April 27, 2013 - Dr. Inocencio Homes - podiatry, was in [place] - treating with oral Lamisil and topical nail therapy   Parkinson's disease [facility])     followed by Dr. Lezlie Octave at [facility] and [name], M.D. in [place]   Presbycusis    and tinnitus - Dr. Izora Gala - August/2013   Renal calculus    Syncope     Past Surgical History:  Procedure Laterality Date   HAND SURGERY     INGUINAL HERNIA REPAIR Left 11/04/2015   Procedure: LEFT INGUINAL HERNIA REPAIR WITH MESH;  Surgeon: Armandina Gemma, MD;  Location: [facility];   Service: General;  Laterality: Left;   INSERTION OF MESH Left 11/04/2015   Procedure: INSERTION OF MESH;  Surgeon: Armandina Gemma, MD;  Location: [facility];  Service: General;  Laterality: Left;   JOINT REPLACEMENT Bilateral    KNEE SURGERY     TOTAL KNEE ARTHROPLASTY      There were no vitals filed for this visit.   Subjective Assessment - 09/26/21 1452     Subjective Reports doing exercises sometimes at home. No falls.    Pertinent History PMH: PD (diagnosed 2010 with symptoms to 2008), diabetes, chronic BLE edema (following up with cardiology), HTN, hx of bilat knee replacement, BPH.    Limitations Walking    Patient Stated Goals wants to be more normal in his walking.    Currently in Pain? No/denies                               [facility] Adult PT Treatment/Exercise - 09/26/21 1503       Ambulation/Gait   Ambulation/Gait Yes    Ambulation/Gait Assistance 5: Supervision    Ambulation/Gait Assistance Details Cues for bigger stride length and incr foot clearance with heel strike. Pt reports that this helps slow down  his gait. Reminder cues to rock prior to initiating gait to decr shuffling.    Ambulation Distance (Feet) 345 Feet    Assistive device None    Gait Pattern Step-through pattern;Decreased step length - right;Decreased arm swing - left;Decreased dorsiflexion - right;Decreased dorsiflexion - left;Left foot flat;Right foot flat;Shuffle;Decreased trunk rotation;Trunk flexed    Ambulation Surface Level;Indoor    Gait Comments Resisted gait with belt around pelvis attached to [employer]; forwards and then retro gait with cues for controlled/slowed pace with incr step length/foot clearance going forwards and backwards, performed x5 reps, then side stepping against resistance down and back x3 reps, cues for incr step length/foot clearance and stepping with control back to midline esp going to R. improved with incr reps      High Level Balance    High Level Balance Activities Negotiating over obstacles    High Level Balance Comments Stepping over 4 orange obstacles - 2 larger, 2 smaller, next to countertop with cues for slowed pace, incr hip/knee flexion when clearing obstacles, down and back x4 reps. cued for when turning around to perform again to perform with a marching turn for incr foot clearance/weight shifting.            Pt performs PWR! Moves in standing position at edge of mat table with chair in front as needed for balance. Reviewed from [place]! Up for improved posture x10 reps, cues for scapular retraction    PWR! Rock for improved weighshifting - x10 reps with reaching and looking up at hands, cues to slow down at times.    PWR! Twist for improved trunk rotation x10 reps each side - cues for tall posture in midline.    PWR! Step for improved step initiation x10 reps each side, difficulties with clearing feet. Performed an additional x10 reps each side with of yard sticks to assist with incr foot clearance when stepping.    Intermittent cues provided for technique. Educated on importance of continuing to perform as HEP to assist with balance/larger movement patterns.      Balance Exercises - 09/26/21 1527       Balance Exercises: Standing   Stepping Strategy Lateral;10 reps;Limitations    Stepping Strategy Limitations Over 2" step > 4" step, in the lateral direction x10 reps each side with cues for foot clearance/weight shifting.    Other Standing Exercises Marching through step ladder for incr stride length/foot clearance, down and back x4 reps with cues for incr ROM when marching.                  PT Short Term Goals - 09/21/21 1608       PT SHORT TERM GOAL #1   Title Pt will be independent with initial PD specific HEP in order to build upon functional gains made in therapy. ALL STGS DUE 09/14/21    Baseline 09/21/21- Pt reports not doing HEP consistently as he has been dealing w/ chronic UTI.     Time 4    Period Weeks    Status On-going    Target Date 09/14/21      PT SHORT TERM GOAL #2   Title Pt will verbalize/demonstrate understanding of techniques/strategies to help with freezing episodes.    Time 4    Period Weeks    Status Achieved      PT SHORT TERM GOAL #3   Title Pt will undergo further assessment of 6MWT with LTG written in order to demo improved  endurance/community mobility.    Baseline 1420' with min guard needed at times due to shuffling/festination    Time 4    Period Weeks    Status Achieved      PT SHORT TERM GOAL #4   Title Pt will recover lateral  balance in push and release test in 2 or less robust steps independently, for improved balance recovery    Baseline to L = 3 steps, to R = 4 steps, needing therapist assist for balance. 09/21/21- L= 3 steps, R= 4 steps, needing therapist assist for balance    Time 4    Period Weeks    Status Partially Met               PT Long Term Goals - 08/22/21 2013       PT LONG TERM GOAL #1   Title Pt will be independent with final PD specific HEP in order to build upon functional gains made in therapy. ALL LTGS DUE 10/12/21    Time 8    Period Weeks    Status New      PT LONG TERM GOAL #2   Title Pt will ambulate at least 1470' in 6MWT with supervision with improved foot clearance/gait mechanics in order to demo improved safety and endurance for community mobility.    Baseline 1420' with min guard needed at times due to shuffling/festination and incr forward flexed posture    Time 8    Period Weeks    Status Revised      PT LONG TERM GOAL #3   Title Pt will improve miniBEST to at least a 23/28 in order to demo decr fall risk    Time 8    Period Weeks    Status New      PT LONG TERM GOAL #4   Title Pt will verbalize understanding of local Parkinson's disease resources, including options for continued community fitness after D/C    Time 8    Period Weeks    Status New      PT LONG TERM GOAL #5    Title Pt will improve TUG and TUG cog score to less than or equal to 10% difference for improved dual task/decreased fall risk.    Time 8    Period Weeks    Status New                   Plan - 09/26/21 1632     Clinical Impression Statement Today's skilled session focused on standing SLS tasks/weight shifting, gait training, and stepping strategies (esp in the lateral direction). With resisted gait, pt challenged with controlled retro gait and R side stepping back to starting point. Did need cues for slowed pace and wider BOS to prevent shuffled steps. Will conitnue to progress towards LTGs.    Personal Factors and Comorbidities Comorbidity 3+;Time since onset of injury/illness/exacerbation;Past/Current Experience    Comorbidities PD (diagnosed 2010 with symptoms to 2008), diabetes, chronic BLE edema, HTN, hx of bilat knee replacement, BPH    Examination-Activity Limitations Stairs;Locomotion Level    Examination-Participation Restrictions Community Activity   fishing, bird hunting   Stability/Clinical Decision Making Stable/Uncomplicated    Rehab Potential Good    PT Frequency 2x / week    PT Duration 12 weeks    PT Treatment/Interventions ADLs/Self Care Home Management;DME Instruction;Gait training;Stair training;Functional mobility training;Balance training;Therapeutic exercise;Therapeutic activities;Neuromuscular re-education;Patient/family education;Vestibular    PT Next Visit Plan Pt wants to try stairs w/o handrails, review standing  PWR moves as needed. gait training with posture/foot clearance and slowed pace.  Lateral stepping strategies. SLS tasks. Working on turning - marching turning in smaller spaces for foot clearance.    PT Home Exercise Plan standing PWR.    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   Abnormal involuntary movement 02/18/2018   Encounter for general adult medical examination with abnormal findings 02/18/2018   Enlarged prostate 02/18/2018   Hearing loss 02/18/2018   Other long term (current) drug therapy 02/18/2018   Type 2 diabetes mellitus with hyperglycemia ([place]) 02/18/2018   Reducible left inguinal hernia 11/03/2015   Hypercholesteremia    Nephrolithiasis    Parkinson's disease ([place])    Diabetes mellitus with coincident hypertension ([place])    Syncope    Renal calculus    Diabetes ([place])    Allergic rhinitis    BPH (benign prostatic hyperplasia)    Erectile dysfunction    Dysphagia, pharyngeal phase    Edema    Presbycusis    Onychomycosis of toenail    Ureteral calculus 02/09/2013   LEG PAIN 03/14/2010    Arliss Journey, PT, DPT  09/26/2021, 4:34 PM  [facility] [address] [place], [name], [address] Phone: [phone]   Fax:  [phone]  [name] MRN: [id number] Date of Birth: January 16, 1946

## 2021-09-27 ENCOUNTER — Other Ambulatory Visit: Payer: Self-pay | Admitting: Adult Health

## 2021-09-27 DIAGNOSIS — D4101 Neoplasm of uncertain behavior of right kidney: Secondary | ICD-10-CM

## 2021-09-28 ENCOUNTER — Ambulatory Visit: Payer: Medicare Other

## 2021-10-03 ENCOUNTER — Ambulatory Visit: Payer: Medicare Other | Admitting: Physical Therapy

## 2021-10-03 ENCOUNTER — Encounter: Payer: Self-pay | Admitting: Physical Therapy

## 2021-10-03 ENCOUNTER — Other Ambulatory Visit: Payer: Self-pay

## 2021-10-03 DIAGNOSIS — R2689 Other abnormalities of gait and mobility: Secondary | ICD-10-CM | POA: Diagnosis not present

## 2021-10-03 DIAGNOSIS — R2681 Unsteadiness on feet: Secondary | ICD-10-CM | POA: Diagnosis not present

## 2021-10-03 DIAGNOSIS — R29818 Other symptoms and signs involving the nervous system: Secondary | ICD-10-CM | POA: Diagnosis not present

## 2021-10-03 NOTE — Patient Instructions (Signed)
Access Code: [email or web address] URL: [email or web address] Date: 10/03/2021 Prepared by: Janann August  Exercises Standing Quarter Turn with Counter Support - 1 x daily - 5 x weekly - 2 sets - 5 reps

## 2021-10-03 NOTE — Therapy (Addendum)
[facility] [address] [place], [name], [address] Phone: [phone]   Fax:  [phone]  Physical Therapy Treatment  Patient Details  [name] MRN: [id number] Date of Birth: [birth year] Referring Provider (PT): Dr. Carles Collet   Encounter Date: 10/03/2021   PT End of Session - 10/03/21 1454     Visit Number 6    Number of Visits 17    Date for PT Re-Evaluation 11/15/21    Authorization Type U[employer] Medicare    PT Start Time 1452   pt late to session   PT Stop Time 1531    PT Time Calculation (min) 39 min    Activity Tolerance Patient tolerated treatment well    Behavior During Therapy [facility] for tasks assessed/performed             Past Medical History:  Diagnosis Date   Allergic rhinitis    Arthritis    BPH (benign prostatic hyperplasia)    Diabetes ([place])    Dysphagia, pharyngeal phase    GERD diagnosed on barium swallow. Has small hiatal hernia. Symptomatically somewhat better on omeprazole but not entirely. We'll try b.i.d. therapy   Edema    1+ in both ankles, likely multifactorial including medication such as Requip   Erectile dysfunction    Staxyn 10 mg or Viagra worked well. 3 samples of Cialis 20 mg provided   GERD (gastroesophageal reflux disease)    Hypercholesteremia    Hypertension    Nephrolithiasis    Onychomycosis of toenail    April 27, 2013 - Dr. Inocencio Homes - podiatry, was in [place] - treating with oral Lamisil and topical nail therapy   Parkinson's disease [facility])     followed by Dr. Lezlie Octave at [facility] and [name], M.D. in [place]   Presbycusis    and tinnitus - Dr. Izora Gala - August/2013   Renal calculus    Syncope     Past Surgical History:  Procedure Laterality Date   HAND SURGERY     INGUINAL HERNIA REPAIR Left 11/04/2015   Procedure: LEFT INGUINAL HERNIA REPAIR WITH MESH;  Surgeon: Armandina Gemma, MD;  Location: [facility];  Service: General;   Laterality: Left;   INSERTION OF MESH Left 11/04/2015   Procedure: INSERTION OF MESH;  Surgeon: Armandina Gemma, MD;  Location: [facility];  Service: General;  Laterality: Left;   JOINT REPLACEMENT Bilateral    KNEE SURGERY     TOTAL KNEE ARTHROPLASTY      There were no vitals filed for this visit.   Subjective Assessment - 10/03/21 1454     Subjective No falls. Reports some days at home his balance is better than others. But reports that balance is better since starting therapy.    Pertinent History PMH: PD (diagnosed 2010 with symptoms to 2008), diabetes, chronic BLE edema (following up with cardiology), HTN, hx of bilat knee replacement, BPH.    Limitations Walking    Patient Stated Goals wants to be more normal in his walking.    Currently in Pain? No/denies                               [facility] Adult PT Treatment/Exercise - 10/03/21 1501       Ambulation/Gait   Ambulation/Gait Yes    Ambulation/Gait Assistance 5: Supervision    Ambulation/Gait Assistance Details Cues for incr stride length, foot clearance/heel strike,  and slowing down gait speed. Initial cues for tall posture and rocking through hips to help initiate gait to decr shuffling. Educated on importance on focusing on slowing down gait, esp over the holidays when things can be hectic, in order to decr fall risk by focusing on larger movement patterns.    Ambulation Distance (Feet) 345 Feet    Assistive device None    Gait Pattern Step-through pattern;Decreased step length - right;Decreased arm swing - left;Decreased dorsiflexion - right;Decreased dorsiflexion - left;Left foot flat;Right foot flat;Shuffle;Decreased trunk rotation;Trunk flexed    Ambulation Surface Level;Indoor      Exercises   Exercises Other Exercises    Other Exercises  With BLE/BUE at gear 2.0 > 2.5 for 6 minutes for ROM, activity tolerance. Cues for bigger movement patterns. Pt initially reporting effort level as a 5/10,  but then incr to a 6-7/10 with cues. Educated on importance of aerobic exercise with PD. Pt reporting he has a recumbent bike at home that he could use.                 Balance Exercises - 10/03/21 1528       Balance Exercises: Standing   Stepping Strategy Lateral;Limitations    Stepping Strategy Limitations Alternating lateral stepping on air ex, x12 reps each leg with BUE support, cued for posture. Performed posterior/lateral stepping in a digonal with reaching, x12 reps each side, cues for incr step length/height. Does have incr difficulty with stepping RLE back to midline.    Turning Limitations    Turning Limitations At countertop working on quarter turns (as if turning like faces on a clock) - taking a big step leading with outside foot.  Performed going both directions from 12:00 > 9:00 > 12:00 > 3:00. Performed approx. x10 reps each leg. Initial difficulty going to the L, but improved with incr reps. Added to pt's HEP to help with initiation of turns    Other Standing Exercises Wide BOS reaching across body to grab cone from therapist and then performing a big lateral step to place it on table. Cues for big reaching and incr step height/length. Performed ~10 reps each side. Incr difficulty with stepping RLE back to midline.           Access Code: [email or web address] URL: [email or web address] Date: 10/03/2021 Prepared by: Janann August  New addition to pt's HEP on 10/03/21:   Exercises Standing Quarter Turn with Counter Support - 1 x daily - 5 x weekly - 2 sets - 5 reps      PT Education - 10/03/21 1456     Education Details Taking his carbidopa-levodopa at the same time everyday (as prescribed by his doctor) and setting a reminder on his phone on when to take (as pt sometimes does not take at the same time everyday) and not taking medication with protein as that decr effectiveness of medication, added quarter turns at countertop to [name]) Educated  Patient    Methods Explanation;Handout;Demonstration;Verbal cues    Comprehension Verbalized understanding;Returned demonstration;Need further instruction              PT Short Term Goals - 09/21/21 1608       PT SHORT TERM GOAL #1   Title Pt will be independent with initial PD specific HEP in order to build upon functional gains made in therapy. ALL STGS DUE 09/14/21    Baseline 09/21/21- Pt reports not doing HEP consistently as he has been dealing w/ chronic UTI.  Time 4    Period Weeks    Status On-going    Target Date 09/14/21      PT SHORT TERM GOAL #2   Title Pt will verbalize/demonstrate understanding of techniques/strategies to help with freezing episodes.    Time 4    Period Weeks    Status Achieved      PT SHORT TERM GOAL #3   Title Pt will undergo further assessment of 6MWT with LTG written in order to demo improved endurance/community mobility.    Baseline 1420' with min guard needed at times due to shuffling/festination    Time 4    Period Weeks    Status Achieved      PT SHORT TERM GOAL #4   Title Pt will recover lateral  balance in push and release test in 2 or less robust steps independently, for improved balance recovery    Baseline to L = 3 steps, to R = 4 steps, needing therapist assist for balance. 09/21/21- L= 3 steps, R= 4 steps, needing therapist assist for balance    Time 4    Period Weeks    Status Partially Met               PT Long Term Goals - 08/22/21 2013       PT LONG TERM GOAL #1   Title Pt will be independent with final PD specific HEP in order to build upon functional gains made in therapy. ALL LTGS DUE 10/12/21    Time 8    Period Weeks    Status New      PT LONG TERM GOAL #2   Title Pt will ambulate at least 1470' in 6MWT with supervision with improved foot clearance/gait mechanics in order to demo improved safety and endurance for community mobility.    Baseline 1420' with min guard needed at times due to  shuffling/festination and incr forward flexed posture    Time 8    Period Weeks    Status Revised      PT LONG TERM GOAL #3   Title Pt will improve miniBEST to at least a 23/28 in order to demo decr fall risk    Time 8    Period Weeks    Status New      PT LONG TERM GOAL #4   Title Pt will verbalize understanding of local Parkinson's disease resources, including options for continued community fitness after D/C    Time 8    Period Weeks    Status New      PT LONG TERM GOAL #5   Title Pt will improve TUG and TUG cog score to less than or equal to 10% difference for improved dual task/decreased fall risk.    Time 8    Period Weeks    Status New                   Plan - 10/04/21 0817     Clinical Impression Statement Continued to work on gait training with focus on slowing down pace, incr stride length and foot clearance in order to decr festination/freezing episodes. When pt tries to ask questions to therapist during gait, pt tends to take more shuffled/fast steps and have more forward flexed posture. Pt needing cues to focus on gait pattern. Worked on turning strategies today to help with incr foot clearance/weight shifting when changing directions and added quarter turns to pt's HEP. When performing lateral stepping strategies, pt with incr difficulty with  stepping RLE back to midline. Will continue to progress towards LTGs.    Personal Factors and Comorbidities Comorbidity 3+;Time since onset of injury/illness/exacerbation;Past/Current Experience    Comorbidities PD (diagnosed 2010 with symptoms to 2008), diabetes, chronic BLE edema, HTN, hx of bilat knee replacement, BPH    Examination-Activity Limitations Stairs;Locomotion Level    Examination-Participation Restrictions Community Activity   fishing, bird hunting   Stability/Clinical Decision Making Stable/Uncomplicated    Rehab Potential Good    PT Frequency 2x / week    PT Duration 12 weeks    PT  Treatment/Interventions ADLs/Self Care Home Management;DME Instruction;Gait training;Stair training;Functional mobility training;Balance training;Therapeutic exercise;Therapeutic activities;Neuromuscular re-education;Patient/family education;Vestibular    PT Next Visit Plan Review standing PWR moves as needed. gait training with posture/foot clearance and slowed pace and gait with dual tasking while maintaining stride length.  Lateral stepping strategies. SLS tasks. Review turning technique - quarter turns from HEP and wide BOS U turns during gait when in larger spaces. Will probably need to add more appts as pt has missed some.    PT Home Exercise Plan standing PWR.    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   Abnormal involuntary movement 02/18/2018   Encounter for general adult medical examination with abnormal findings 02/18/2018   Enlarged prostate 02/18/2018   Hearing loss 02/18/2018   Other long term (current) drug therapy 02/18/2018   Type 2 diabetes mellitus with hyperglycemia ([place]) 02/18/2018   Reducible left inguinal hernia 11/03/2015   Hypercholesteremia    Nephrolithiasis    Parkinson's disease ([place])    Diabetes mellitus with coincident hypertension ([place])    Syncope    Renal calculus    Diabetes ([place])    Allergic rhinitis    BPH (benign prostatic hyperplasia)    Erectile dysfunction     Dysphagia, pharyngeal phase    Edema    Presbycusis    Onychomycosis of toenail    Ureteral calculus 02/09/2013   LEG PAIN 03/14/2010    Arliss Journey, PT, DPT  10/04/2021, 8:21 AM  [facility] [address] [place], [name], [address] Phone: [phone]   Fax:  [phone]  [name] MRN: [id number] Date of Birth: 08-04-1946

## 2021-10-05 ENCOUNTER — Ambulatory Visit: Payer: Medicare Other

## 2021-10-10 ENCOUNTER — Ambulatory Visit: Payer: Medicare Other

## 2021-10-12 ENCOUNTER — Ambulatory Visit: Payer: Medicare Other

## 2021-10-22 ENCOUNTER — Ambulatory Visit
Admission: RE | Admit: 2021-10-22 | Discharge: 2021-10-22 | Disposition: A | Discharge status: Home / Self Care | Payer: Medicare Other | Source: Ambulatory Visit | Attending: Adult Health | Admitting: Adult Health

## 2021-10-22 ENCOUNTER — Other Ambulatory Visit: Payer: Self-pay

## 2021-10-22 DIAGNOSIS — N2889 Other specified disorders of kidney and ureter: Secondary | ICD-10-CM | POA: Diagnosis not present

## 2021-10-22 DIAGNOSIS — D4101 Neoplasm of uncertain behavior of right kidney: Secondary | ICD-10-CM

## 2021-10-22 DIAGNOSIS — K8020 Calculus of gallbladder without cholecystitis without obstruction: Secondary | ICD-10-CM | POA: Diagnosis not present

## 2021-10-22 DIAGNOSIS — K7689 Other specified diseases of liver: Secondary | ICD-10-CM | POA: Diagnosis not present

## 2021-10-22 DIAGNOSIS — N281 Cyst of kidney, acquired: Secondary | ICD-10-CM | POA: Diagnosis not present

## 2021-10-22 MED ORDER — GADOBENATE DIMEGLUMINE 529 MG/ML IV SOLN
18.0000 mL | Freq: Once | INTRAVENOUS | Status: AC | PRN
  Administered 2021-10-22: 18 mL via INTRAVENOUS

## 2021-11-02 DIAGNOSIS — D49511 Neoplasm of unspecified behavior of right kidney: Secondary | ICD-10-CM | POA: Diagnosis not present

## 2021-11-02 DIAGNOSIS — R3914 Feeling of incomplete bladder emptying: Secondary | ICD-10-CM | POA: Diagnosis not present

## 2021-11-23 ENCOUNTER — Other Ambulatory Visit: Payer: Self-pay | Admitting: Neurology

## 2021-11-23 DIAGNOSIS — G20 Parkinson's disease: Secondary | ICD-10-CM

## 2021-11-28 DIAGNOSIS — E119 Type 2 diabetes mellitus without complications: Secondary | ICD-10-CM | POA: Diagnosis not present

## 2021-12-04 ENCOUNTER — Other Ambulatory Visit: Payer: Self-pay | Admitting: Neurology

## 2021-12-04 DIAGNOSIS — K219 Gastro-esophageal reflux disease without esophagitis: Secondary | ICD-10-CM | POA: Diagnosis not present

## 2021-12-04 DIAGNOSIS — E785 Hyperlipidemia, unspecified: Secondary | ICD-10-CM | POA: Diagnosis not present

## 2021-12-04 DIAGNOSIS — G20 Parkinson's disease: Secondary | ICD-10-CM

## 2021-12-04 DIAGNOSIS — E119 Type 2 diabetes mellitus without complications: Secondary | ICD-10-CM | POA: Diagnosis not present

## 2021-12-04 DIAGNOSIS — I10 Essential (primary) hypertension: Secondary | ICD-10-CM | POA: Diagnosis not present

## 2021-12-20 DIAGNOSIS — I10 Essential (primary) hypertension: Secondary | ICD-10-CM | POA: Diagnosis not present

## 2021-12-27 DIAGNOSIS — H0100A Unspecified blepharitis right eye, upper and lower eyelids: Secondary | ICD-10-CM | POA: Diagnosis not present

## 2021-12-31 ENCOUNTER — Other Ambulatory Visit: Payer: Self-pay

## 2021-12-31 ENCOUNTER — Encounter (HOSPITAL_COMMUNITY): Payer: Self-pay

## 2021-12-31 ENCOUNTER — Inpatient Hospital Stay (HOSPITAL_COMMUNITY)
Admission: EM | Admit: 2021-12-31 | Discharge: 2022-01-05 | DRG: 690 | Disposition: A | Discharge status: Home Health | Payer: Medicare Other | Attending: Internal Medicine | Admitting: Internal Medicine

## 2021-12-31 ENCOUNTER — Emergency Department (HOSPITAL_COMMUNITY): Payer: Medicare Other

## 2021-12-31 ENCOUNTER — Encounter: Payer: Self-pay | Admitting: Urology

## 2021-12-31 DIAGNOSIS — Z794 Long term (current) use of insulin: Secondary | ICD-10-CM

## 2021-12-31 DIAGNOSIS — R3914 Feeling of incomplete bladder emptying: Secondary | ICD-10-CM | POA: Diagnosis not present

## 2021-12-31 DIAGNOSIS — B9689 Other specified bacterial agents as the cause of diseases classified elsewhere: Secondary | ICD-10-CM

## 2021-12-31 DIAGNOSIS — Z7982 Long term (current) use of aspirin: Secondary | ICD-10-CM

## 2021-12-31 DIAGNOSIS — Z96653 Presence of artificial knee joint, bilateral: Secondary | ICD-10-CM | POA: Diagnosis present

## 2021-12-31 DIAGNOSIS — R531 Weakness: Secondary | ICD-10-CM

## 2021-12-31 DIAGNOSIS — B961 Klebsiella pneumoniae [K. pneumoniae] as the cause of diseases classified elsewhere: Secondary | ICD-10-CM | POA: Diagnosis present

## 2021-12-31 DIAGNOSIS — R509 Fever, unspecified: Secondary | ICD-10-CM

## 2021-12-31 DIAGNOSIS — E7800 Pure hypercholesterolemia, unspecified: Secondary | ICD-10-CM | POA: Diagnosis not present

## 2021-12-31 DIAGNOSIS — Z66 Do not resuscitate: Secondary | ICD-10-CM | POA: Diagnosis present

## 2021-12-31 DIAGNOSIS — N3000 Acute cystitis without hematuria: Secondary | ICD-10-CM | POA: Diagnosis not present

## 2021-12-31 DIAGNOSIS — N419 Inflammatory disease of prostate, unspecified: Secondary | ICD-10-CM | POA: Diagnosis present

## 2021-12-31 DIAGNOSIS — E871 Hypo-osmolality and hyponatremia: Secondary | ICD-10-CM | POA: Diagnosis present

## 2021-12-31 DIAGNOSIS — J309 Allergic rhinitis, unspecified: Secondary | ICD-10-CM | POA: Diagnosis not present

## 2021-12-31 DIAGNOSIS — N400 Enlarged prostate without lower urinary tract symptoms: Secondary | ICD-10-CM | POA: Diagnosis present

## 2021-12-31 DIAGNOSIS — Z8619 Personal history of other infectious and parasitic diseases: Secondary | ICD-10-CM

## 2021-12-31 DIAGNOSIS — M1990 Unspecified osteoarthritis, unspecified site: Secondary | ICD-10-CM | POA: Diagnosis present

## 2021-12-31 DIAGNOSIS — Z885 Allergy status to narcotic agent status: Secondary | ICD-10-CM

## 2021-12-31 DIAGNOSIS — I10 Essential (primary) hypertension: Secondary | ICD-10-CM

## 2021-12-31 DIAGNOSIS — Z888 Allergy status to other drugs, medicaments and biological substances status: Secondary | ICD-10-CM

## 2021-12-31 DIAGNOSIS — Z8249 Family history of ischemic heart disease and other diseases of the circulatory system: Secondary | ICD-10-CM

## 2021-12-31 DIAGNOSIS — E1122 Type 2 diabetes mellitus with diabetic chronic kidney disease: Secondary | ICD-10-CM | POA: Diagnosis present

## 2021-12-31 DIAGNOSIS — N183 Chronic kidney disease, stage 3 unspecified: Secondary | ICD-10-CM

## 2021-12-31 DIAGNOSIS — N1831 Chronic kidney disease, stage 3a: Secondary | ICD-10-CM | POA: Diagnosis not present

## 2021-12-31 DIAGNOSIS — Z79899 Other long term (current) drug therapy: Secondary | ICD-10-CM

## 2021-12-31 DIAGNOSIS — E785 Hyperlipidemia, unspecified: Secondary | ICD-10-CM | POA: Diagnosis not present

## 2021-12-31 DIAGNOSIS — N390 Urinary tract infection, site not specified: Principal | ICD-10-CM | POA: Diagnosis present

## 2021-12-31 DIAGNOSIS — Z20822 Contact with and (suspected) exposure to covid-19: Secondary | ICD-10-CM | POA: Diagnosis present

## 2021-12-31 DIAGNOSIS — K219 Gastro-esophageal reflux disease without esophagitis: Secondary | ICD-10-CM | POA: Diagnosis present

## 2021-12-31 DIAGNOSIS — D72829 Elevated white blood cell count, unspecified: Secondary | ICD-10-CM | POA: Diagnosis not present

## 2021-12-31 DIAGNOSIS — G20 Parkinson's disease: Secondary | ICD-10-CM | POA: Diagnosis present

## 2021-12-31 DIAGNOSIS — H9193 Unspecified hearing loss, bilateral: Secondary | ICD-10-CM | POA: Diagnosis not present

## 2021-12-31 DIAGNOSIS — R1313 Dysphagia, pharyngeal phase: Secondary | ICD-10-CM | POA: Diagnosis not present

## 2021-12-31 DIAGNOSIS — E876 Hypokalemia: Secondary | ICD-10-CM | POA: Diagnosis not present

## 2021-12-31 DIAGNOSIS — Z1624 Resistance to multiple antibiotics: Secondary | ICD-10-CM | POA: Diagnosis not present

## 2021-12-31 DIAGNOSIS — Z7984 Long term (current) use of oral hypoglycemic drugs: Secondary | ICD-10-CM | POA: Diagnosis not present

## 2021-12-31 DIAGNOSIS — E119 Type 2 diabetes mellitus without complications: Secondary | ICD-10-CM

## 2021-12-31 DIAGNOSIS — I872 Venous insufficiency (chronic) (peripheral): Secondary | ICD-10-CM | POA: Diagnosis not present

## 2021-12-31 DIAGNOSIS — I129 Hypertensive chronic kidney disease with stage 1 through stage 4 chronic kidney disease, or unspecified chronic kidney disease: Secondary | ICD-10-CM | POA: Diagnosis not present

## 2021-12-31 DIAGNOSIS — Z87440 Personal history of urinary (tract) infections: Secondary | ICD-10-CM

## 2021-12-31 DIAGNOSIS — R0902 Hypoxemia: Secondary | ICD-10-CM | POA: Diagnosis not present

## 2021-12-31 DIAGNOSIS — Z743 Need for continuous supervision: Secondary | ICD-10-CM | POA: Diagnosis not present

## 2021-12-31 LAB — CBC WITH DIFFERENTIAL/PLATELET
Abs Immature Granulocytes: 0.07 10*3/uL (ref 0.00–0.07)
Basophils Absolute: 0 10*3/uL (ref 0.0–0.1)
Basophils Relative: 0 %
Eosinophils Absolute: 0 10*3/uL (ref 0.0–0.5)
Eosinophils Relative: 0 %
HCT: 45.9 % (ref 39.0–52.0)
Hemoglobin: 14.8 g/dL (ref 13.0–17.0)
Immature Granulocytes: 1 %
Lymphocytes Relative: 3 %
Lymphs Abs: 0.5 10*3/uL — ABNORMAL LOW (ref 0.7–4.0)
MCH: 29.5 pg (ref 26.0–34.0)
MCHC: 32.2 g/dL (ref 30.0–36.0)
MCV: 91.6 fL (ref 80.0–100.0)
Monocytes Absolute: 0.9 10*3/uL (ref 0.1–1.0)
Monocytes Relative: 6 %
Neutro Abs: 12.8 10*3/uL — ABNORMAL HIGH (ref 1.7–7.7)
Neutrophils Relative %: 90 %
Platelets: 193 10*3/uL (ref 150–400)
RBC: 5.01 MIL/uL (ref 4.22–5.81)
RDW: 14.6 % (ref 11.5–15.5)
WBC: 14.3 10*3/uL — ABNORMAL HIGH (ref 4.0–10.5)
nRBC: 0 % (ref 0.0–0.2)

## 2021-12-31 LAB — GLUCOSE, CAPILLARY
Glucose-Capillary: 179 mg/dL — ABNORMAL HIGH (ref 70–99)
Glucose-Capillary: 118 mg/dL — ABNORMAL HIGH (ref 70–99)

## 2021-12-31 LAB — COMPREHENSIVE METABOLIC PANEL
ALT: 9 U/L (ref 0–44)
AST: 22 U/L (ref 15–41)
Albumin: 3.5 g/dL (ref 3.5–5.0)
Alkaline Phosphatase: 102 U/L (ref 38–126)
Anion gap: 9 (ref 5–15)
BUN: 24 mg/dL — ABNORMAL HIGH (ref 8–23)
CO2: 26 mmol/L (ref 22–32)
Calcium: 9.3 mg/dL (ref 8.9–10.3)
Chloride: 97 mmol/L — ABNORMAL LOW (ref 98–111)
Creatinine, Ser: 1.44 mg/dL — ABNORMAL HIGH (ref 0.61–1.24)
GFR, Estimated: 51 mL/min — ABNORMAL LOW (ref >60)
Glucose, Bld: 146 mg/dL — ABNORMAL HIGH (ref 70–99)
Potassium: 3.8 mmol/L (ref 3.5–5.1)
Sodium: 132 mmol/L — ABNORMAL LOW (ref 135–145)
Total Bilirubin: 0.6 mg/dL (ref 0.3–1.2)
Total Protein: 7.5 g/dL (ref 6.5–8.1)

## 2021-12-31 LAB — URINALYSIS, MICROSCOPIC (REFLEX)

## 2021-12-31 LAB — URINALYSIS, ROUTINE W REFLEX MICROSCOPIC
Bilirubin Urine: NEGATIVE
Glucose, UA: NEGATIVE mg/dL
Ketones, ur: 15 mg/dL — AB
Nitrite: POSITIVE — AB
Protein, ur: 100 mg/dL — AB
Specific Gravity, Urine: 1.02 (ref 1.005–1.030)
pH: 6.5 (ref 5.0–8.0)

## 2021-12-31 LAB — RESP PANEL BY RT-PCR (FLU A&B, COVID) ARPGX2
Influenza A by PCR: NEGATIVE
Influenza B by PCR: NEGATIVE
SARS Coronavirus 2 by RT PCR: NEGATIVE

## 2021-12-31 LAB — LACTIC ACID, PLASMA: Lactic Acid, Venous: 1.4 mmol/L (ref 0.5–1.9)

## 2021-12-31 MED ORDER — CARBIDOPA-LEVODOPA 25-100 MG PO TABS
3.0000 | ORAL_TABLET | Freq: Four times a day (QID) | ORAL | Status: DC
  Administered 2021-12-31 – 2022-01-05 (×20): 3 via ORAL
  Filled 2021-12-31 (×20): qty 3

## 2021-12-31 MED ORDER — TRIAMCINOLONE ACETONIDE 55 MCG/ACT NA AERO
2.0000 | INHALATION_SPRAY | Freq: Every day | NASAL | Status: DC
  Administered 2021-12-31 – 2022-01-03 (×4): 2 via NASAL
  Filled 2021-12-31: qty 21.6
  Filled 2021-12-31: qty 10.8

## 2021-12-31 MED ORDER — FUROSEMIDE 40 MG PO TABS
60.0000 mg | ORAL_TABLET | Freq: Two times a day (BID) | ORAL | Status: DC
  Administered 2022-01-01 – 2022-01-05 (×10): 60 mg via ORAL
  Filled 2021-12-31 (×10): qty 1

## 2021-12-31 MED ORDER — INSULIN GLARGINE-YFGN 100 UNIT/ML ~~LOC~~ SOLN
15.0000 [IU] | Freq: Every day | SUBCUTANEOUS | Status: DC
Start: 2021-12-31 — End: 2022-01-06
  Administered 2021-12-31 – 2022-01-05 (×5): 15 [IU] via SUBCUTANEOUS
  Filled 2021-12-31 (×6): qty 0.15

## 2021-12-31 MED ORDER — SODIUM CHLORIDE 0.9 % IV SOLN: Freq: Once | INTRAVENOUS | Status: AC

## 2021-12-31 MED ORDER — INSULIN GLARGINE 100 UNIT/ML ~~LOC~~ SOLN
15.0000 [IU] | Freq: Every day | SUBCUTANEOUS | Status: DC
  Filled 2021-12-31: qty 0.15

## 2021-12-31 MED ORDER — ROPINIROLE HCL 1 MG PO TABS: 1.5000 mg | ORAL_TABLET | ORAL | Status: DC

## 2021-12-31 MED ORDER — SODIUM CHLORIDE 0.9 % IV SOLN: INTRAVENOUS | Status: DC

## 2021-12-31 MED ORDER — LOTEPREDNOL ETABONATE 0.38 % OP GEL
1.0000 [drp] | Freq: Two times a day (BID) | OPHTHALMIC | Status: DC
  Administered 2021-12-31 – 2022-01-03 (×4): 1 [drp] via OPHTHALMIC

## 2021-12-31 MED ORDER — INSULIN ASPART 100 UNIT/ML IJ SOLN
0.0000 [IU] | Freq: Every day | INTRAMUSCULAR | Status: DC
  Administered 2022-01-02 – 2022-01-03 (×2): 2 [IU] via SUBCUTANEOUS
  Administered 2022-01-05: 3 [IU] via SUBCUTANEOUS

## 2021-12-31 MED ORDER — INSULIN ASPART 100 UNIT/ML IJ SOLN
0.0000 [IU] | Freq: Three times a day (TID) | INTRAMUSCULAR | Status: DC
  Administered 2022-01-01: 2 [IU] via SUBCUTANEOUS
  Administered 2022-01-01: 3 [IU] via SUBCUTANEOUS
  Administered 2022-01-02 – 2022-01-03 (×6): 2 [IU] via SUBCUTANEOUS
  Administered 2022-01-04: 3 [IU] via SUBCUTANEOUS
  Administered 2022-01-04 – 2022-01-05 (×4): 2 [IU] via SUBCUTANEOUS
  Administered 2022-01-05: 3 [IU] via SUBCUTANEOUS

## 2021-12-31 MED ORDER — ENOXAPARIN SODIUM 40 MG/0.4ML IJ SOSY
40.0000 mg | PREFILLED_SYRINGE | INTRAMUSCULAR | Status: DC
  Administered 2021-12-31 – 2022-01-05 (×5): 40 mg via SUBCUTANEOUS
  Filled 2021-12-31 (×5): qty 0.4

## 2021-12-31 MED ORDER — SIMVASTATIN 40 MG PO TABS
40.0000 mg | ORAL_TABLET | Freq: Every evening | ORAL | Status: DC
  Administered 2021-12-31 – 2022-01-05 (×6): 40 mg via ORAL
  Filled 2021-12-31 (×6): qty 1

## 2021-12-31 MED ORDER — ENTACAPONE 200 MG PO TABS
200.0000 mg | ORAL_TABLET | Freq: Three times a day (TID) | ORAL | Status: DC
  Administered 2021-12-31 – 2022-01-05 (×15): 200 mg via ORAL
  Filled 2021-12-31 (×16): qty 1

## 2021-12-31 MED ORDER — SODIUM CHLORIDE 0.9 % IV SOLN
1.0000 g | Freq: Two times a day (BID) | INTRAVENOUS | Status: DC
  Administered 2021-12-31 – 2022-01-01 (×2): 1 g via INTRAVENOUS
  Filled 2021-12-31 (×2): qty 20

## 2021-12-31 MED ORDER — ACETAMINOPHEN 325 MG PO TABS
650.0000 mg | ORAL_TABLET | Freq: Four times a day (QID) | ORAL | Status: DC | PRN
  Administered 2022-01-01: 650 mg via ORAL
  Filled 2021-12-31: qty 2

## 2021-12-31 MED ORDER — TAMSULOSIN HCL 0.4 MG PO CAPS
0.8000 mg | ORAL_CAPSULE | Freq: Every day | ORAL | Status: DC
Start: 2022-01-01 — End: 2022-01-06
  Administered 2022-01-01 – 2022-01-05 (×5): 0.8 mg via ORAL
  Filled 2021-12-31 (×5): qty 2

## 2021-12-31 MED ORDER — LISINOPRIL 5 MG PO TABS
5.0000 mg | ORAL_TABLET | Freq: Every day | ORAL | Status: DC
  Administered 2022-01-01 – 2022-01-05 (×5): 5 mg via ORAL
  Filled 2021-12-31 (×5): qty 1

## 2021-12-31 MED ORDER — ROPINIROLE HCL 1 MG PO TABS
3.0000 mg | ORAL_TABLET | Freq: Two times a day (BID) | ORAL | Status: DC
  Administered 2022-01-01 – 2022-01-05 (×10): 3 mg via ORAL
  Filled 2021-12-31 (×10): qty 3

## 2021-12-31 MED ORDER — AMANTADINE HCL 100 MG PO CAPS
100.0000 mg | ORAL_CAPSULE | Freq: Three times a day (TID) | ORAL | Status: DC
  Administered 2021-12-31 – 2022-01-05 (×15): 100 mg via ORAL
  Filled 2021-12-31 (×16): qty 1

## 2021-12-31 MED ORDER — ROPINIROLE HCL 1 MG PO TABS
1.5000 mg | ORAL_TABLET | Freq: Every day | ORAL | Status: DC
  Administered 2021-12-31 – 2022-01-05 (×5): 1.5 mg via ORAL
  Filled 2021-12-31 (×5): qty 2

## 2021-12-31 MED ORDER — ACETAMINOPHEN 650 MG RE SUPP: 650.0000 mg | Freq: Four times a day (QID) | RECTAL | Status: DC | PRN

## 2021-12-31 NOTE — Progress Notes (Signed)
Patient arrived to the unit close to shift change, patient/wife oriented to room, vitals taken and telemetry applied. Reported off to night shift nurse to F/U with sign and held orders and plan of care.    ?

## 2021-12-31 NOTE — H&P (Signed)
?History and Physical  ? ? ?Patient: [name] ELF:[id number] DOB: 27-Aug-1946 ?DOA: 12/31/2021 ?DOS: the patient was seen and examined on 12/31/2021 ?PCP: Josetta Huddle, MD  ?Patient coming from: Home ? ?Chief Complaint:  ?Chief Complaint  ?Patient presents with  ? Weakness  ? ?HPI: KOLBEY TEICHERT is a 76 y.o. male with medical history significant of HTN, chronic BLE edema, GERD, HLD, BPH, parkinson's disease. Presenting with generalized weakness. Wife notes that he's had fevers the last 2 days w/ general fatigue. They thought that is was d/t exposure to sick grandchildren. He had temperatures up to 100.8 at home. They tried APAP to help, but the temps didn't resolve. She has noticed that the patient has had increased urinary frequency and dark urine. He has had decreased appetite as well as N/V. When his symptoms didn't improve today, they decided to come to the ED for help. They deny any other aggravating or alleviating factors.    ? ?Review of Systems: As mentioned in the history of present illness. All other systems reviewed and are negative. ?Past Medical History:  ?Diagnosis Date  ? Allergic rhinitis   ? Arthritis   ? BPH (benign prostatic hyperplasia)   ? Diabetes ([place])   ? Dysphagia, pharyngeal phase   ? GERD diagnosed on barium swallow. Has small hiatal hernia. Symptomatically somewhat better on omeprazole but not entirely. We'll try b.i.d. therapy  ? Edema   ? 1+ in both ankles, likely multifactorial including medication such as Requip  ? Erectile dysfunction   ? Staxyn 10 mg or Viagra worked well. 3 samples of Cialis 20 mg provided  ? GERD (gastroesophageal reflux disease)   ? Hypercholesteremia   ? Hypertension   ? Nephrolithiasis   ? Onychomycosis of toenail   ? April 27, 2013 - Dr. Inocencio Homes - podiatry, was in [place] - treating with oral Lamisil and topical nail therapy  ? Parkinson's disease ([place])   ?  followed by Dr. Lezlie Octave at [facility] and [name], M.D. in [place]  ? Presbycusis   ? and  tinnitus - Dr. Izora Gala - August/2013  ? Renal calculus   ? Syncope   ? ?Past Surgical History:  ?Procedure Laterality Date  ? HAND SURGERY    ? INGUINAL HERNIA REPAIR Left 11/04/2015  ? Procedure: LEFT INGUINAL HERNIA REPAIR WITH MESH;  Surgeon: Armandina Gemma, MD;  Location: [facility];  Service: General;  Laterality: Left;  ? INSERTION OF MESH Left 11/04/2015  ? Procedure: INSERTION OF MESH;  Surgeon: Armandina Gemma, MD;  Location: [facility];  Service: General;  Laterality: Left;  ? JOINT REPLACEMENT Bilateral   ? KNEE SURGERY    ? TOTAL KNEE ARTHROPLASTY    ? ?Social History:  reports that he has never smoked. He has never used smokeless tobacco. He reports that he does not drink alcohol and does not use drugs. ? ?Allergies  ?Allergen Reactions  ? Oxycodone Itching  ?  This makes him itch  ? Sudafed [Pseudoephedrine Hcl]   ?  Problems urinating   ? ? ?Family History  ?Problem Relation Age of Onset  ? Atrial fibrillation Mother   ? Breast cancer Mother   ? Emphysema Father   ? Heart disease Father   ? Cancer Brother   ?     [place]  ? ? ?Prior to Admission medications   ?Medication Sig Start Date End Date Taking? Authorizing Provider  ?amantadine (SYMMETREL) 100 MG capsule TAKE 1  CAPSULE BY MOUTH THREE TIMES A DAY 12/04/21   [name], [name], DO  ?amoxicillin (AMOXIL) 500 MG capsule Take 2,000 mg by mouth See admin instructions. Take 4 tablets (2000 mg) prior to dental appointments 12/19/17   [provider]  ?aspirin 81 MG tablet Take 81 mg by mouth See admin instructions. Take 1 tablet (81 mg) Mon-Fri    [provider]  ?carbidopa-levodopa (SINEMET IR) 25-100 MG tablet 3 at 6am, 3 at 10am, 3 at 2pm, 3 at 6pm 06/23/21   [name], [name], DO  ?cephALEXin (KEFLEX) 500 MG capsule Take 1 capsule (500 mg total) by mouth 4 (four) times daily. 07/09/21   Hayden Rasmussen, MD  ?cholecalciferol (VITAMIN D) 1000 UNITS tablet Take 1,000 Units by mouth daily.    [provider]  ?doxycycline (VIBRA-TABS) 100 MG tablet Take 100 mg by mouth as needed. ?Patient not taking: Reported on 08/17/2021 05/17/21   [provider]  ?entacapone (COMTAN) 200 MG tablet Take 1 tablet (200 mg total) by mouth 3 (three) times daily. 1 tablet with first THREE dosages of levodopa 06/23/21   [name], [name], DO  ?furosemide (LASIX) 20 MG tablet Take 3 tablets (60 mg total) by mouth 2 (two) times daily. 07/24/21   Jerline Pain, MD  ?glimepiride (AMARYL) 4 MG tablet Take 4 mg by mouth daily with breakfast.    [provider]  ?GLOBAL EASE INJECT PEN NEEDLES 31G X 5 MM MISC Inject into the skin. 05/09/21   [provider]  ?LANTUS SOLOSTAR 100 UNIT/ML Solostar Pen SMARTSIG:15 Unit(s) SUB-Q Every Evening 05/03/21   [provider]  ?metFORMIN (GLUCOPHAGE) 1000 MG tablet Take 1,000 mg by mouth 2 (two) times daily with a meal.     [provider]  ?omeprazole (PRILOSEC) 20 MG capsule Take 20 mg by mouth daily as needed (acid reflux).    [provider]  ?potassium chloride SA (KLOR-CON) 20 MEQ tablet Take 20 mEq by mouth daily. 06/16/21   [provider]  ?rOPINIRole (REQUIP XL) 4 MG 24 hr tablet 1 tablet in the morning, , 1 in the afternoon, half tablet in the evening 11/23/21   [name], Rebecca S, DO  ?rOPINIRole (REQUIP) 3 MG tablet TAKE 1 TABLET BY MOUTH IN THE MORNING, 1 TABLET IN THE AFTERNOON, AND 1/2 TABLET IN THE EVENING. 08/14/21   [name], [name], DO  ?rOPINIRole (REQUIP) 3 MG tablet 1 in the AM, 1 in the afternoon, 1/2 tablet in the evening 08/14/21   [name], [name], DO  ?Saw [place] 500 MG CAPS Take 1 capsule by mouth daily.    [provider]  ?simvastatin (ZOCOR) 40 MG tablet Take 40 mg by mouth every evening.    [provider]  ?sulfamethoxazole-trimethoprim (BACTRIM DS) 800-160 MG tablet Take 1 tablet by mouth 2 (two) times daily. 04/15/21   [provider]  ?tamsulosin (FLOMAX) 0.4 MG CAPS capsule Take 0.4 mg  by mouth daily.  01/24/18   [provider]  ?triamterene-hydrochlorothiazide (MAXZIDE-25) 37.5-25 MG per tablet Take 1 tablet by mouth daily.    [provider]  ? ? ?Physical Exam: ?Vitals:  ? 12/31/21 1159 12/31/21 1309 12/31/21 1338 12/31/21 1530  ?BP: (!) 149/86 135/79 (!) 162/77 (!) 141/77  ?Pulse: 79 88 93 94  ?Resp: 16 20 (!) 21 (!) 25  ?Temp: 98.5 ?F (36.9 ?C)     ?TempSrc: Oral     ?SpO2: 99% 94% 96% 95%  ?Weight:      ?  Height:      ? ?General: 76 y.o. male resting in bed in NAD ?Eyes: PERRL, normal sclera ?ENMT: Nares patent w/o discharge, orophaynx clear, dentition normal, ears w/o discharge/lesions/ulcers ?Neck: Supple, trachea midline ?Cardiovascular: RRR, +S1, S2, no m/g/r, equal pulses throughout ?Respiratory: CTABL, no w/r/r, normal WOB ?GI: BS+, NDNT, no masses noted, no organomegaly noted ?MSK: No e/c/c ?Neuro: A&O x 3, no focal deficits ?Psyc: Appropriate interaction and affect, calm/cooperative ? ?Data Reviewed: ? ?Na+  132 ?BUN  24 ?SCr  1.44 ?WBC  14.3 ?UA positive for leukocytes and nitrites ? ?CXR: Cardiomegaly. There are no signs of pulmonary edema or focal pulmonary consolidation. ? ?Assessment and Plan: ?No notes have been filed under this hospital service. ?Service: Hospitalist ?UTI ?    - admit to inpt, tele ?    - Hx of MDR organism; will continue with merrem for now ?    - follow UCx ?    - fluids, APAP PRN ? ?Generalized weakness ?    - as a side effect of above, treat UTI, monitor for improvement ? ?DM2 ?    - A1c, SSI, glucose checks, carb mod diet ? ?Parkinson's Disease ?    - continue home regimen ? ?HLD ?    - continue home regimen ? ?BPH ?    - continue home regimen ? ?HTN ?    - continue home regimen ? ?Advance Care Planning:   Code Status: DNR ? ?Consults: EDP spoke w/ urology ? ?Family Communication: w/ wife at bedside ? ?Severity of Illness: ?The appropriate patient status for this patient is INPATIENT. Inpatient status is judged to be reasonable and  necessary in order to provide the required intensity of service to ensure the patient's safety. The patient's presenting symptoms, physical exam findings, and initial radiographic and laboratory data in the context of th

## 2021-12-31 NOTE — ED Provider Triage Note (Signed)
Emergency Medicine Provider Triage Evaluation Note ? ?[name] , a 76 y.o. male  was evaluated in triage. H/o parkinson's. Pt complains of weakness in entire body for since yesterday. H/o UTIs. Denies dysuria or hematuria, but reports dark urine with foul smell. Occasional nausea. Denies any chest pain, SOB, or abdominal pain ? ?Review of Systems  ?Positive: Dark urine, nausea ?Negative: Abdominal pain, nausea, back pain, chest pain, SOB ? ?Physical Exam  ?BP (!) 149/86 (BP Location: Right Arm)   Pulse 79   Temp 98.5 ?F (36.9 ?C) (Oral)   Resp 16   Ht 6' (1.829 m)   Wt 87.5 kg   SpO2 99%   BMI 26.18 kg/m?  ?Gen:   Awake, no distress   ?Resp:  Normal effort, CTAB ?MSK:   Moves extremities without difficulty, finger to nose intact, cranial nervesII-XII intact.  ?Other:  Abdomen soft, nontender ? ?Medical Decision Making  ?Medically screening exam initiated at 12:05 PM.  Appropriate orders placed.  [name] was informed that the remainder of the evaluation will be completed by another provider, this initial triage assessment does not replace that evaluation, and the importance of remaining in the ED until their evaluation is complete. ? ?Additional labs ordered for weakness.  ?  ?Sherrell Puller, PA-C ?12/31/21 1210 ? ?

## 2021-12-31 NOTE — Progress Notes (Unsigned)
Patient's wife called.  [name] has had fever and weakness since 3/17 and was given Levaquin by his PCP but has continued to decline and they are bringing him to [facility] ED.   He had a positive culture in January with Klebsiella only sens to IV meds and not Levaquin.  He was not treated in January as he was not symptomatic.   Culture results below.  ? ? ?Performed By: [facility] [employer], [place], [place], [place], [address]; Lab Dir: Vedia Pereyra, [phone] ?>100,000 C/ML GRAM NEGATIVE RODS; ID AND SENSITIVITY TO FOLLOW ?ROrganism 1 - N  ?Notes: ?Performed By: [facility]	[place], [employer], [address] [place], [place], [place], [address]; Lab Dir: Vedia Pereyra, [phone] ?. ?Antimicrobial Susceptibility and Organism Identification Report  FINAL  ?Source : URINE CULTURE ?Iso/Result : 01 >100,000 C/ML Klebsiella pneumoniae ?Antimicrobic/Dose MIC SYSTEMIC URINE ?--------------------------------------------------------------- ?Amp/Sulbactam >16/8 R R ?Amikacin <16 S S ?Ampicillin >16 R R ?Amox/K Clav 16/8 I I ?Aztreonam >16 R R ?Ceftriaxone >32 R R ?Ceftazidime >16 R R ?Ceftazidime/K Clav 2  ?Cefotaxime >32 R R ?Cefotaxime/K Clavu <0.5  ?Cefotaxime-ESBL >1  ?Cefazolin >16 R R ?Ciprofloxacin >2 R R ?Cefepime >16 R R ?Cefotetan <16 S S ?Ertapenem <0.5 S S ?Nitrofurantoin 64 I ?Gentamicin >8 R R ?Levofloxacin >4 R R ?Meropenem <1 S S ?Minocycline >8 R R ?Pip/Tazo <16 S S ?Trimeth/Sulfa >2/38 R R ?Tetracycline >8 R R ?Tobramycin >8 R R ?ffice Visit Report     11/02/2021  ? ?-------------------------------------------------------------------------------- ?  ?[name]  ?MRN: [id number]  ?DOB: 1946-01-06, 76 year old Male  ? ? PRIMARY CARE:  R Marcellus Scott, MD  ?REFERRING:  Daine Gravel, NP  ?PROVIDER:  Bjorn Loser, M.D.  ?TREATING:  Wendie Simmer, M.D.  ?LOCATION:  [facility] [phone]  ?  ? ?-------------------------------------------------------------------------------- ?  ?CC/HPI: Patient  was followed by Dr. Darnell Level on Flomax. He thinks it helps. Flow varies. He voids every 2-3 hours. He gets up 2-3 times a night. he passed a kidney stone Years ago.  ?I saw worse PSA runs between 2 and 3 but has not been done for a number years it appears  ?No blood in the urine or infections  ? ?60 g prostate or larger but benign  ? ?Residual 47 mL  ? ?Patient does reasonably well on Flomax. 90 x 3 sent and I will see him in 1 year  ? ?In the last 6 months he started having little bit urge incontinence. He had a negative rectal examination 3 months ago 90 x 3 Flomax sent to pharmacy. Residual today 87 mL. Does not wear a pad. Frequency stable. Clinically not infected. Call if urine culture positive  ? ?Reassess with a residual in 5 weeks on Myrbetriq 50 mg samples and prescription  ? ?TOday  ?I saw the patient June 30/2022. He went to emergency room July 2. No culture sent on review medical record Patient saw Dr. Mamie Nick on April 24, 2021. He had gross hematuria over the weekend. Urine catheter was placed. He has gone into retention years ago when he took Sudafed. He was on Myrbetriq. It was stopped. He had called about a week prior with burning and spoke to another 1 of the providers who called in Bactrim.  ? ?Based upon the history it appears at the patient within 1 day of taking Myrbetriq had trouble to void. He had a get a catheter in the emergency department. The blood in the urine was with  a catheter in place. He had a failed trial of voiding on Myrbetriq last week. Foley was placed. Myrbetriq was stopped. He is here for another trial of voiding. Eight or 9 years ago he went into retention on Sudafed. I think he is frustrated because he has had a catheter in so long. Pathophysiology described. Trial of voiding described.  ?urine sent for culture  ? ?It looks like last week he voided 100 mL after 240 mL was placed. Catheter was replaced.  ? ?Today he only voided small amounts was scanned for 574 mL.  ? ?I explained to the  patient to pathophysiology of voiding and elevated residuals and how the balance was tipped by the beta 3 agonist. We talked about the risk of retention and its frequency in men. He is not having constipation But loose bowel movements. He understands usually the residuals get back to the baseline since the medications out of his system. I want him to double up on the Flomax and give another trial of voiding next week. His Parkinson's is another risk factor. I thought it was best to put another catheter in today versus trying to teach self catheterization. He understands the role of future urodynamics and potential prostate procedures. Clean intermittent catheterization short-term may be a reasonable option to see if he can start voiding. risk of lightheadedness especially in Parkinson's discussed. trans urethral resection of the prostate in patients with Parkinson's and efficacy would need to be discussed in the future regarding retention efficacy and issues with incontinence especially in a patient who is already started to have some urge incontinence  ? ?discussed Saw palmetto which she has been on in the past and the role of finasteride  ? ?Patient will go back on the saw palmetto which he stopped a few weeks ago. They understand no acute benefit from finasteride. he has had Parkinson's for many years as noted but has good hand function but has balance issues. He will take the Flomax at bedtime and has not had side effects on the lower dose. He understands the mother the [place] He will have a trial of voiding at the nurse level next Monday morning she will call me in the afternoon. He understands that he be taught how to do clean intermittent catheterization or have a catheter at that time if needed. We would then order urodynamics. He understands Parkinson's can have poorly contractile bladder is within without outlet obstruction. I mentioned office based procedures for  retention. Unfortunately it appears the Myrbetriq tip the balance of his voiding since his residuals prior were normal. I emphasized with his situation especially having to go to ER and any delays He understands not to do a trial of voiding on a Friday  ? ?when I reviewed the medical record the patient had called in with burning and frequency and my partner called in Bactrim and told him to go the emergency room if he develops retention. It was 3 days later when he went to the emergency room not voiding for 12 hours or longer= I will ask a more about this since the bladder infection may have tip the balance if he truly had 1 his culture just prior to that was negative.  ? ?05/17/2021  ?Patient passed a voiding trial but return to the office last week with urinary retention and Foley catheter was replaced. 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 As mentioned above, he does have  a history of Parkinson's disease. 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  ? ?06/15/2021:  ?A 76 year old male who presents today for a voiding trial. He underwent resume with 6 treatments on 05/28/2021. He has noted some blood in his urine. He reports that he has been riding on his lawnmower. He denies large blood clots or fevers. He continues tamsulosin 0.8 mg b.i.d.. He is tolerating this wel  ? ?06/20/21:  ?Mr. Early presents today for a 5 day follow-up after he passed a voiding trial at last office visit. Unfortunately over the weekend he developed inability to urinate and presented to the emergency department where his bladder was decompressed of 1 L. He currently has a Foley catheter in place at this time.  ? ?07/06/2021: 76 year old male who underwent resume 6 weeks ago presents today for a voiding trial. He has been tolerating the catheter well. He denies fevers and chills. Denies gross hematuria. Continues on tamsulosin.   ? ?07/12/21: 76 year old male who underwent resume 6-8 weeks ago presents today after he passed a trial of void at last office visit. Unfortunately, he developed urinary retention 3 days ago and was seen in the eme

## 2021-12-31 NOTE — ED Provider Notes (Signed)
?[name] ?Provider Note ? ? ?CSN: [id number] ?Arrival date & time: 12/31/21  1142 ? ?  ? ?History ? ?Chief Complaint  ?Patient presents with  ? Weakness  ? ? ?[name] is a 76 y.o. male. ? ?HPI ?Presents complaining of weakness, and personal concern for recurrent UTI.  Patient has been ill for several days following exposure to his grandchildren with URIs.  He has had decreased oral intake, occasional sneezing and coughing, fatigue and tiredness.  For the last 3 days he has not been able to get up and walk at all, he is typically constrained from full ambulation by his Parkinson'Le disease.  He is currently taking Levaquin, prescribed by his PCP for possible UTI, without improvement. ? ?Patient with multiple comorbidities including Parkinson'Le, diabetes, elderly and debilitated status. ?  ? ?Home Medications ?Prior to Admission medications   ?Medication Sig Start Date End Date Taking? Authorizing Provider  ?amantadine (SYMMETREL) 100 MG capsule TAKE 1 CAPSULE BY MOUTH THREE TIMES A DAY 12/04/21   [name], [name], DO  ?amoxicillin (AMOXIL) 500 MG capsule Take 2,000 mg by mouth See admin instructions. Take 4 tablets (2000 mg) prior to dental appointments 12/19/17   [provider]  ?aspirin 81 MG tablet Take 81 mg by mouth See admin instructions. Take 1 tablet (81 mg) Mon-Fri    [provider]  ?carbidopa-levodopa (SINEMET IR) 25-100 MG tablet 3 at 6am, 3 at 10am, 3 at 2pm, 3 at 6pm 06/23/21   [name], [name], DO  ?cephALEXin (KEFLEX) 500 MG capsule Take 1 capsule (500 mg total) by mouth 4 (four) times daily. 07/09/21   Hayden Rasmussen, MD  ?cholecalciferol (VITAMIN D) 1000 UNITS tablet Take 1,000 Units by mouth daily.    [provider]  ?doxycycline (VIBRA-TABS) 100 MG tablet Take 100 mg by mouth as needed. ?Patient not taking: Reported on 08/17/2021 05/17/21   [provider]  ?entacapone (COMTAN) 200 MG tablet Take 1 tablet (200 mg total) by mouth  3 (three) times daily. 1 tablet with first THREE dosages of levodopa 06/23/21   [name], [name], DO  ?furosemide (LASIX) 20 MG tablet Take 3 tablets (60 mg total) by mouth 2 (two) times daily. 07/24/21   Jerline Pain, MD  ?glimepiride (AMARYL) 4 MG tablet Take 4 mg by mouth daily with breakfast.    [provider]  ?GLOBAL EASE INJECT PEN NEEDLES 31G X 5 MM MISC Inject into the skin. 05/09/21   [provider]  ?LANTUS SOLOSTAR 100 UNIT/ML Solostar Pen SMARTSIG:15 Unit(Le) SUB-Q Every Evening 05/03/21   [provider]  ?metFORMIN (GLUCOPHAGE) 1000 MG tablet Take 1,000 mg by mouth 2 (two) times daily with a meal.     [provider]  ?omeprazole (PRILOSEC) 20 MG capsule Take 20 mg by mouth daily as needed (acid reflux).    [provider]  ?potassium chloride SA (KLOR-CON) 20 MEQ tablet Take 20 mEq by mouth daily. 06/16/21   [provider]  ?rOPINIRole (REQUIP XL) 4 MG 24 hr tablet 1 tablet in the morning, , 1 in the afternoon, half tablet in the evening 11/23/21   [name], Eric S, DO  ?rOPINIRole (REQUIP) 3 MG tablet TAKE 1 TABLET BY MOUTH IN THE MORNING, 1 TABLET IN THE AFTERNOON, AND 1/2 TABLET IN THE EVENING. 08/14/21   [name], [name], DO  ?rOPINIRole (REQUIP) 3 MG tablet 1 in the AM, 1 in the afternoon, 1/2 tablet in the evening 08/14/21  Ludwig Clarks, DO  ?Saw Palmetto 500 MG CAPS Take 1 capsule by mouth daily.    [provider]  ?simvastatin (ZOCOR) 40 MG tablet Take 40 mg by mouth every evening.    [provider]  ?sulfamethoxazole-trimethoprim (BACTRIM DS) 800-160 MG tablet Take 1 tablet by mouth 2 (two) times daily. 04/15/21   [provider]  ?tamsulosin (FLOMAX) 0.4 MG CAPS capsule Take 0.4 mg by mouth daily.  01/24/18   [provider]  ?triamterene-hydrochlorothiazide (MAXZIDE-25) 37.5-25 MG per tablet Take 1 tablet by mouth daily.    [provider]  ?   ? ?Allergies    ?Oxycodone and Sudafed [pseudoephedrine  hcl]   ? ?Review of Systems   ?Review of Systems ? ?Physical Exam ?Updated Vital Signs ?BP (!) 141/77   Pulse 94   Temp 98.5 ?F (36.9 ?C) (Oral)   Resp (!) 25   Ht 6' (1.829 m)   Wt 87.5 kg   SpO2 95%   BMI 26.18 kg/m?  ?Physical Exam ?Vitals and nursing note reviewed.  ?Constitutional:   ?   General: He is not in acute distress. ?   Appearance: He is well-developed. He is not ill-appearing or diaphoretic.  ?HENT:  ?   Head: Normocephalic and atraumatic.  ?   Right Ear: External ear normal.  ?   Left Ear: External ear normal.  ?Eyes:  ?   Conjunctiva/sclera: Conjunctivae normal.  ?   Pupils: Pupils are equal, round, and reactive to light.  ?Neck:  ?   Trachea: Phonation normal.  ?Cardiovascular:  ?   Rate and Rhythm: Normal rate and regular rhythm.  ?   Heart sounds: Normal heart sounds.  ?Pulmonary:  ?   Effort: Pulmonary effort is normal.  ?   Breath sounds: Normal breath sounds.  ?Abdominal:  ?   Palpations: Abdomen is soft.  ?   Tenderness: There is no abdominal tenderness.  ?Musculoskeletal:     ?   General: Normal range of motion.  ?   Cervical back: Normal range of motion and neck supple.  ?Skin: ?   General: Skin is warm and dry.  ?Neurological:  ?   Mental Status: He is alert.  ?   Cranial Nerves: No cranial nerve deficit.  ?   Sensory: No sensory deficit.  ?   Motor: No abnormal muscle tone.  ?   Coordination: Coordination normal.  ?Psychiatric:     ?   Mood and Affect: Mood normal.     ?   Behavior: Behavior normal.  ? ? ?ED Results / Procedures / Treatments   ?Labs ?(all labs ordered are listed, but only abnormal results are displayed) ?Labs Reviewed  ?URINALYSIS, ROUTINE W REFLEX MICROSCOPIC - Abnormal; Notable for the following components:  ?    Result Value  ? Hgb urine dipstick MODERATE (*)   ? Ketones, ur 15 (*)   ? Protein, ur 100 (*)   ? Nitrite POSITIVE (*)   ? Leukocytes,Ua SMALL (*)   ? All other components within normal limits  ?CBC WITH DIFFERENTIAL/PLATELET - Abnormal; Notable for the  following components:  ? WBC 14.3 (*)   ? Neutro Abs 12.8 (*)   ? Lymphs Abs 0.5 (*)   ? All other components within normal limits  ?COMPREHENSIVE METABOLIC PANEL - Abnormal; Notable for the following components:  ? Sodium 132 (*)   ? Chloride 97 (*)   ? Glucose, Bld 146 (*)   ? BUN 24 (*)   ?  Creatinine, Ser 1.44 (*)   ? GFR, Estimated 51 (*)   ? All other components within normal limits  ?URINALYSIS, MICROSCOPIC (REFLEX) - Abnormal; Notable for the following components:  ? Bacteria, UA FEW (*)   ? All other components within normal limits  ?RESP PANEL BY RT-PCR (FLU A&B, COVID) [id number]  ?CULTURE, BLOOD (ROUTINE X 2)  ?CULTURE, BLOOD (ROUTINE X 2)  ?LACTIC ACID, PLASMA  ? ? ?EKG ?EKG Interpretation ? ?Date/Time:  Sunday December 31 2021 12:34:32 EDT ?Ventricular Rate:  72 ?PR Interval:  137 ?QRS Duration: 119 ?QT Interval:  395 ?QTC Calculation: 433 ?R Axis:   -28 ?Text Interpretation: Sinus rhythm Multiple premature complexes, vent & supraven LVH with IVCD and secondary repol abnrm Baseline wander in lead(Le) V1 V3 Since last tracing ectopy is new, Otherwise no significant change Confirmed by Daleen Bo [phone]) on 12/31/2021 2:28:34 PM ? ?Radiology ?DG Chest Port 1 View ? ?Result Date: 12/31/2021 ?CLINICAL DATA:  Generalized weakness EXAM: PORTABLE CHEST 1 VIEW COMPARISON:  11/25/2020 FINDINGS: Transverse diameter of heart is increased. Apparent shift of mediastinum to the right may be due to rotation. Central pulmonary vessels are prominent without signs of alveolar pulmonary edema. There is no focal pulmonary consolidation. There is no pleural effusion or pneumothorax. IMPRESSION: Cardiomegaly. There are no signs of pulmonary edema or focal pulmonary consolidation. Electronically Signed   By: Elmer Picker M.D.   On: 12/31/2021 13:15   ? ?Procedures ?Procedures  ? ? ?Medications Ordered in ED ?Medications  ?amantadine (SYMMETREL) capsule 100 mg (has no administration in time range)  ?carbidopa-levodopa  (SINEMET IR) 25-100 MG per tablet immediate release 3 tablet (has no administration in time range)  ?entacapone (COMTAN) tablet 200 mg (has no administration in time range)  ? ? ?ED Course/ Medical Decision Makin

## 2021-12-31 NOTE — ED Triage Notes (Signed)
Per EMS- patient c/o weakness x a few days and reports a history of frequent UTI's.  ?

## 2021-12-31 NOTE — ED Notes (Signed)
Unable to get blood work °

## 2021-12-31 NOTE — Progress Notes (Signed)
Pharmacy Antibiotic Note ? ?SANTIEL Le is a 76 y.o. male admitted on 12/31/2021 with UTI.  Pharmacy has been consulted for meropenem dosing based on previous hx of ESBL kleb pneumo and citrobacter UTI from September 2022.  ? ?Plan: ?Meropenem 1g IV q12 based on current renal function ? ?Height: 6' (182.9 cm) ?Weight: 87.5 kg (193 lb) ?IBW/kg (Calculated) : 77.6 ? ?Temp (24hrs), Avg:98.5 ?F (36.9 ?C), Min:98.5 ?F (36.9 ?C), Max:98.5 ?F (36.9 ?C) ? ?Recent Labs  ?Lab 12/31/21 ?1330 12/31/21 ?1339  ?WBC 14.3*  --   ?CREATININE 1.44*  --   ?LATICACIDVEN  --  1.4  ?  ?Estimated Creatinine Clearance: 48.6 mL/min (A) (by C-G formula based on SCr of 1.44 mg/dL (H)).   ? ?Allergies  ?Allergen Reactions  ? Oxycodone Itching  ?  This makes him itch  ? Sudafed [Pseudoephedrine Hcl]   ?  Problems urinating   ? ? ? ?Thank you for allowing pharmacy to be a part of this patientErics care. ? ?Kara Mead ?12/31/2021 4:01 PM ? ?

## 2022-01-01 ENCOUNTER — Telehealth: Payer: Self-pay | Admitting: Neurology

## 2022-01-01 DIAGNOSIS — N400 Enlarged prostate without lower urinary tract symptoms: Secondary | ICD-10-CM

## 2022-01-01 DIAGNOSIS — Z794 Long term (current) use of insulin: Secondary | ICD-10-CM

## 2022-01-01 DIAGNOSIS — R531 Weakness: Secondary | ICD-10-CM | POA: Diagnosis not present

## 2022-01-01 DIAGNOSIS — N183 Chronic kidney disease, stage 3 unspecified: Secondary | ICD-10-CM

## 2022-01-01 DIAGNOSIS — E785 Hyperlipidemia, unspecified: Secondary | ICD-10-CM

## 2022-01-01 DIAGNOSIS — D72829 Elevated white blood cell count, unspecified: Secondary | ICD-10-CM

## 2022-01-01 DIAGNOSIS — I10 Essential (primary) hypertension: Secondary | ICD-10-CM

## 2022-01-01 DIAGNOSIS — E119 Type 2 diabetes mellitus without complications: Secondary | ICD-10-CM

## 2022-01-01 DIAGNOSIS — G20 Parkinson's disease: Secondary | ICD-10-CM

## 2022-01-01 DIAGNOSIS — E871 Hypo-osmolality and hyponatremia: Secondary | ICD-10-CM

## 2022-01-01 DIAGNOSIS — N3000 Acute cystitis without hematuria: Secondary | ICD-10-CM | POA: Diagnosis not present

## 2022-01-01 LAB — HEMOGLOBIN A1C
Hgb A1c MFr Bld: 6.4 % — ABNORMAL HIGH (ref 4.8–5.6)
Mean Plasma Glucose: 136.98 mg/dL

## 2022-01-01 LAB — GLUCOSE, CAPILLARY
Glucose-Capillary: 162 mg/dL — ABNORMAL HIGH (ref 70–99)
Glucose-Capillary: 154 mg/dL — ABNORMAL HIGH (ref 70–99)
Glucose-Capillary: 241 mg/dL — ABNORMAL HIGH (ref 70–99)
Glucose-Capillary: 172 mg/dL — ABNORMAL HIGH (ref 70–99)

## 2022-01-01 LAB — COMPREHENSIVE METABOLIC PANEL
ALT: <5 U/L (ref 0–44)
AST: 37 U/L (ref 15–41)
Albumin: 2.7 g/dL — ABNORMAL LOW (ref 3.5–5.0)
Alkaline Phosphatase: 89 U/L (ref 38–126)
Anion gap: 7 (ref 5–15)
BUN: 24 mg/dL — ABNORMAL HIGH (ref 8–23)
CO2: 26 mmol/L (ref 22–32)
Calcium: 8.6 mg/dL — ABNORMAL LOW (ref 8.9–10.3)
Chloride: 103 mmol/L (ref 98–111)
Creatinine, Ser: 1.37 mg/dL — ABNORMAL HIGH (ref 0.61–1.24)
GFR, Estimated: 54 mL/min — ABNORMAL LOW (ref >60)
Glucose, Bld: 125 mg/dL — ABNORMAL HIGH (ref 70–99)
Potassium: 3.9 mmol/L (ref 3.5–5.1)
Sodium: 136 mmol/L (ref 135–145)
Total Bilirubin: 0.5 mg/dL (ref 0.3–1.2)
Total Protein: 5.9 g/dL — ABNORMAL LOW (ref 6.5–8.1)

## 2022-01-01 LAB — CBC
HCT: 42.1 % (ref 39.0–52.0)
Hemoglobin: 13.5 g/dL (ref 13.0–17.0)
MCH: 29.4 pg (ref 26.0–34.0)
MCHC: 32.1 g/dL (ref 30.0–36.0)
MCV: 91.7 fL (ref 80.0–100.0)
Platelets: 181 10*3/uL (ref 150–400)
RBC: 4.59 MIL/uL (ref 4.22–5.81)
RDW: 14.5 % (ref 11.5–15.5)
WBC: 10 10*3/uL (ref 4.0–10.5)
nRBC: 0 % (ref 0.0–0.2)

## 2022-01-01 MED ORDER — SODIUM CHLORIDE 0.9 % IV SOLN
1.0000 g | Freq: Three times a day (TID) | INTRAVENOUS | Status: DC
  Administered 2022-01-01 – 2022-01-05 (×11): 1 g via INTRAVENOUS
  Filled 2022-01-01: qty 20
  Filled 2022-01-01 (×2): qty 1
  Filled 2022-01-01: qty 20
  Filled 2022-01-01: qty 1
  Filled 2022-01-01 (×3): qty 20
  Filled 2022-01-01: qty 1
  Filled 2022-01-01 (×4): qty 20

## 2022-01-01 MED ORDER — POLYETHYLENE GLYCOL 3350 17 G PO PACK
17.0000 g | PACK | Freq: Every day | ORAL | Status: DC | PRN
Start: 2022-01-01 — End: 2022-01-06

## 2022-01-01 MED ORDER — SENNOSIDES-DOCUSATE SODIUM 8.6-50 MG PO TABS
1.0000 | ORAL_TABLET | Freq: Two times a day (BID) | ORAL | Status: DC
  Administered 2022-01-01 – 2022-01-05 (×9): 1 via ORAL
  Filled 2022-01-01 (×9): qty 1

## 2022-01-01 MED ORDER — BISACODYL 10 MG RE SUPP
10.0000 mg | Freq: Every day | RECTAL | Status: DC | PRN
Start: 2022-01-01 — End: 2022-01-06

## 2022-01-01 MED ORDER — GUAIFENESIN-DM 100-10 MG/5ML PO SYRP
5.0000 mL | ORAL_SOLUTION | ORAL | Status: DC | PRN
  Administered 2022-01-01 – 2022-01-04 (×4): 5 mL via ORAL
  Filled 2022-01-01 (×4): qty 10

## 2022-01-01 NOTE — Progress Notes (Signed)
?PROGRESS NOTE ? ? ? ?[name]  UXN:[id number] DOB: 1946/05/26 DOA: 12/31/2021 ?PCP: Josetta Huddle, MD  ? ?Brief Narrative:  ?77 y.o. male with medical history significant of HTN, chronic bilateral lower extremity edema, GERD, HLD, BPH, parkinson's disease presented with weakness and fevers at home.  On presentation, UA was positive for leukocyte esterase and nitrites.  Chest x-ray was negative for consolidation.  He was started on IV fluids and antibiotics. ? ?Assessment & Plan: ?  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  Currently on meropenem. ?-Blood cultures negative so far.  COVID-19/influenza negative on presentation.  Urine culture not done on presentation.  I have ordered urine cultures. ? ?Generalized weakness ?-Possibly from above.  PT eval ? ?Parkinson's disease ?-Continue Sinemet, amantadine and entacapone.  Outpatient follow-up with neurology ? ?Hypertension ?-Continue lisinopril and Lasix.  Monitor blood pressure ? ?Hyperlipidemia ?Continue simvastatin ? ?BPH ?-Continue tamsulosin ? ?Diabetes mellitus type 2 ?-A1c 6.4.  Continue Lantus along with CBGs with SSI. ? ?Hyponatremia ?-Resolved ? ?Leukocytosis ?-Resolved ? ?Chronic kidney disease stage IIIa ?-Creatinine stable.  Monitor ? ? ?DVT prophylaxis: Lovenox ?Code Status: DNR ?Family Communication: Wife at bedside ?Disposition Plan: ?Status is: Inpatient ?Remains inpatient appropriate because: Of need for IV antibiotics.  History of MDR UTI in the past.  Follow cultures ? ? ? ?Consultants: None ? ?Procedures: None ? ?Antimicrobials: Meropenem from 12/31/2021 onwards ? ? ?Subjective: ?Patient seen and examined at bedside.  Poor historian.  Wife at bedside.  No overnight fever, vomiting or shortness of breath reported.  Patient still feels very weak. ? ?Objective: ?Vitals:  ? 12/31/21 1826 12/31/21 2143 01/01/22 0230 01/01/22 0559  ?BP: 138/73 (!) 143/77 110/67 114/71  ?Pulse: 94 89 82 (!) 56  ?Resp: '20 20 20 20   '$ ?Temp: 99.5 ?F (37.5 ?C) 98.8 ?F (37.1 ?C) 98.9 ?F (37.2 ?C) 99 ?F (37.2 ?C)  ?TempSrc: Oral Oral Oral Oral  ?SpO2: 96% 97% 96% 97%  ?Weight:      ?Height:      ? ? ?Intake/Output Summary (Last 24 hours) at 01/01/2022 1048 ?Last data filed at 01/01/2022 0234 ?Gross per 24 hour  ?Intake 220 ml  ?Output 450 ml  ?Net -230 ml  ? ?Filed Weights  ? 12/31/21 1153  ?Weight: 87.5 kg  ? ? ?Examination: ? ?General exam: Appears calm and comfortable.  Currently on room air.  Looks chronically ill and deconditioned. ?Respiratory system: Bilateral decreased breath sounds at bases ?Cardiovascular system: S1 & S2 heard, intermittently bradycardic ?Gastrointestinal system: Abdomen is nondistended, soft and nontender. Normal bowel sounds heard. ?Extremities: No cyanosis, clubbing; bilateral lower extremity edema present ?Central nervous system: Awake, extremely slow to respond.  Poor historian.  No focal neurological deficits. Moving extremities ?Skin: No rashes, lesions or ulcers ?Psychiatry: Affect is mostly flat.  No signs of agitation.  Does not participate in conversation much. ? ?Data Reviewed: I have personally reviewed following labs and imaging studies ? ?CBC: ?Recent Labs  ?Lab 12/31/21 ?1330 01/01/22 ?2542  ?WBC 14.3* 10.0  ?NEUTROABS 12.8*  --   ?HGB 14.8 13.5  ?HCT 45.9 42.1  ?MCV 91.6 91.7  ?PLT 193 181  ? ?Basic Metabolic Panel: ?Recent Labs  ?Lab 12/31/21 ?1330 01/01/22 ?7062  ?NA 132* 136  ?K 3.8 3.9  ?CL 97* 103  ?CO2 26 26  ?GLUCOSE 146* 125*  ?BUN 24* 24*  ?CREATININE 1.44* 1.37*  ?CALCIUM 9.3 8.6*  ? ?GFR: ?Estimated Creatinine Clearance: 51.1 mL/min (A) (  by C-G formula based on SCr of 1.37 mg/dL (H)). ?Liver Function Tests: ?Recent Labs  ?Lab 12/31/21 ?1330 01/01/22 ?8295  ?AST 22 37  ?ALT 9 <5  ?ALKPHOS 102 89  ?BILITOT 0.6 0.5  ?PROT 7.5 5.9*  ?ALBUMIN 3.5 2.7*  ? ?No results for input(s): LIPASE, AMYLASE in the last 168 hours. ?No results for input(s): AMMONIA in the last 168 hours. ?Coagulation Profile: ?No  results for input(s): INR, PROTIME in the last 168 hours. ?Cardiac Enzymes: ?No results for input(s): CKTOTAL, CKMB, CKMBINDEX, TROPONINI in the last 168 hours. ?BNP (last 3 results) ?No results for input(s): PROBNP in the last 8760 hours. ?HbA1C: ?Recent Labs  ?  01/01/22 ?6213  ?HGBA1C 6.4*  ? ?CBG: ?Recent Labs  ?Lab 12/31/21 ?1839 12/31/21 ?2129 01/01/22 ?0939  ?GLUCAP 179* 118* 162*  ? ?Lipid Profile: ?No results for input(s): CHOL, HDL, LDLCALC, TRIG, CHOLHDL, LDLDIRECT in the last 72 hours. ?Thyroid Function Tests: ?No results for input(s): TSH, T4TOTAL, FREET4, T3FREE, THYROIDAB in the last 72 hours. ?Anemia Panel: ?No results for input(s): VITAMINB12, FOLATE, FERRITIN, TIBC, IRON, RETICCTPCT in the last 72 hours. ?Sepsis Labs: ?Recent Labs  ?Lab 12/31/21 ?1339  ?LATICACIDVEN 1.4  ? ? ?Recent Results (from the past 240 hour(s))  ?Resp Panel by RT-PCR (Flu A&B, Covid) Nasopharyngeal Swab     Status: None  ? Collection Time: 12/31/21  1:29 PM  ? Specimen: Nasopharyngeal Swab; Nasopharyngeal(NP) swabs in vial transport medium  ?Result Value Ref Range Status  ? SARS Coronavirus 2 by RT PCR NEGATIVE NEGATIVE Final  ?  Comment: (NOTE) ?SARS-CoV-2 target nucleic acids are NOT DETECTED. ? ?The SARS-CoV-2 RNA is generally detectable in upper respiratory ?specimens during the acute phase of infection. The lowest ?concentration of SARS-CoV-2 viral copies this assay can detect is ?138 copies/mL. A negative result does not preclude SARS-Cov-2 ?infection and should not be used as the sole basis for treatment or ?other patient management decisions. A negative result may occur with  ?improper specimen collection/handling, submission of specimen other ?than nasopharyngeal swab, presence of viral mutation(s) within the ?areas targeted by this assay, and inadequate number of viral ?copies(<138 copies/mL). A negative result must be combined with ?clinical observations, patient history, and epidemiological ?information. The  expected result is Negative. ? ?Fact Sheet for Patients:  ?EntrepreneurPulse.com.au ? ?Fact Sheet for Healthcare Providers:  ?IncredibleEmployment.be ? ?This test is no t yet approved or cleared by the [place] FDA and  ?has been authorized for detection and/or diagnosis of SARS-CoV-2 by ?FDA under an Emergency Use Authorization (EUA). This EUA will remain  ?in effect (meaning this test can be used) for the duration of the ?COVID-19 declaration under Section 564(b)(1) of the Act, 21 ?U.S.C.section 360bbb-3(b)(1), unless the authorization is terminated  ?or revoked sooner.  ? ? ?  ? Influenza A by PCR NEGATIVE NEGATIVE Final  ? Influenza B by PCR NEGATIVE NEGATIVE Final  ?  Comment: (NOTE) ?The Xpert Xpress SARS-CoV-2/FLU/RSV plus assay is intended as an aid ?in the diagnosis of influenza from Nasopharyngeal swab specimens and ?should not be used as a sole basis for treatment. Nasal washings and ?aspirates are unacceptable for Xpert Xpress SARS-CoV-2/FLU/RSV ?testing. ? ?Fact Sheet for Patients: ?EntrepreneurPulse.com.au ? ?Fact Sheet for Healthcare Providers: ?IncredibleEmployment.be ? ?This test is not yet approved or cleared by the [place] FDA and ?has been authorized for detection and/or diagnosis of SARS-CoV-2 by ?FDA under an Emergency Use Authorization (EUA). This EUA will remain ?in effect (meaning this test can be  used) for the duration of the ?COVID-19 declaration under Section 564(b)(1) of the Act, 21 U.S.C. ?section 360bbb-3(b)(1), unless the authorization is terminated or ?revoked. ? ?Performed at [facility], [address] ?[place], [place] [address] ?  ?Culture, blood (routine x 2)     Status: None (Preliminary result)  ? Collection Time: 12/31/21  1:34 PM  ? Specimen: BLOOD  ?Result Value Ref Range Status  ? Specimen Description   Final  ?  BLOOD RIGHT ANTECUBITAL ?Performed at [facility], [place] [address], [place], [place] [address] ?  ? Special Requests   Final  ?  BOTTLES DRAWN AEROBIC AND ANAEROBIC Blood Culture results may not be optimal due to an inadequate volume of blood re

## 2022-01-01 NOTE — Evaluation (Addendum)
Physical Therapy Evaluation ?Patient Details ?[name] ?MRN: [id number] ?DOB: February 11, 1946 ?Today's Date: 01/01/2022 ? ?History of Present Illness ? Patient is a 76 year old male who presented with weakness and fever from home. patient was found to have UTI. PMH: HTN, chronic bilateral lower extremity edema, GERD, HLD, BPH, parkinson's disease  ?Clinical Impression ? Patient presents with  significant gait disturbance, inability to safely ambulate from BR(RN had ambulated  patient  to BR using RW). Patient with festinating  gait, lagging R Leg behind, trunk flexed. Decreased awareness of deficits. ?Patient may benefit from post acute unless gait /balance improve to return home.  ?Patient's wife present. Pt admitted with above diagnosis.  Pt currently with functional limitations due to the deficits listed below (see PT Problem List). Pt will benefit from skilled PT to increase their independence and safety with mobility to allow discharge to the venue listed below.   ? ?   ?   ? ?Recommendations for follow up therapy are one component of a multi-disciplinary discharge planning process, led by the attending physician.  Recommendations may be updated based on patient status, additional functional criteria and insurance authorization. ? ?Follow Up Recommendations Skilled nursing-short term rehab (<3 hours/day) ? ?  ?Assistance Recommended at Discharge Frequent or constant Supervision/Assistance  ?Patient can return home with the following ? Two people to help with walking and/or transfers;A lot of help with bathing/dressing/bathroom;Assistance with cooking/housework;Assist for transportation;Direct supervision/assist for medications management;Help with stairs or ramp for entrance ? ?  ?Equipment Recommendations None recommended by PT  ?Recommendations for Other Services ?    ?  ?Functional Status Assessment Patient has had a recent decline in their functional status and demonstrates the ability to make significant  improvements in function in a reasonable and predictable amount of time.  ? ?  ?Precautions / Restrictions Precautions ?Precautions: Fall ?Restrictions ?Weight Bearing Restrictions: No  ? ?  ? ?Mobility ? Bed Mobility ?  ?  ?  ?  ?  ?  ?  ?General bed mobility comments: patient was in bathroom at start of session ?  ? ?Transfers ?Overall transfer level: Needs assistance ?Equipment used: Rolling walker (2 wheels) ?Transfers: Sit to/from Stand, toilet  to chair/wheelchair/BSC ?Sit to Stand: Min assist ?  ?Step pivot transfers: Mod assist, +2 physical assistance, +2 safety/equipment ?  ?  ?  ?General transfer comment: patient  used rail to pull to stand from toilet, sat down again on toilet with decreased control of descent  patient stepped to recliner brought up to BR door and stepped around to back up due to poor ability to ambulate ?  ? ?Ambulation/Gait ?Ambulation/Gait assistance: Max assist, +2 physical assistance, +2 safety/equipment ?Gait Distance (Feet): 3 Feet ?Assistive device: Rolling walker (2 wheels) ?Gait Pattern/deviations: Festinating, Trunk flexed ?  ?  ?  ?General Gait Details: patient lagging right leg/foot behind with attempts to ambulate. recliner brought up  to BR ? ?Stairs ?  ?  ?  ?  ?  ? ?Wheelchair Mobility ?  ? ?Modified Rankin (Stroke Patients Only) ?  ? ?  ? ?Balance Overall balance assessment: Needs assistance ?Sitting-balance support: No upper extremity supported, Feet supported ?Sitting balance-Leahy Scale: Fair ?  ?  ?Standing balance support: Reliant on assistive device for balance, Bilateral upper extremity supported, During functional activity ?Standing balance-Leahy Scale: Zero ?  ?  ?  ?  ?  ?  ?  ?  ?  ?  ?  ?  ?   ? ? ? ?  Pertinent Vitals/Pain Pain Assessment ?Pain Assessment: No/denies pain  ? ? ?Home Living Family/patient expects to be discharged to:: Private residence ?Living Arrangements: Spouse/significant other ?Available Help at Discharge: Available  PRN/intermittently;Family ?Type of Home: House ?Home Access: Stairs to enter ?  ?Entrance Stairs-Number of Steps: 5 steps ?  ?Home Layout: Able to live on main level with bedroom/bathroom;Laundry or work area in basement ?Home Equipment: Kasandra Knudsen - single point;BSC/3in1;Shower seat - built in;Shower seat ?   ?  ?Prior Function Prior Level of Function : Independent/Modified Independent ?  ?  ?  ?  ?  ?  ?Mobility Comments: uses cane or walker on and off prior to admission, drives ?  ?  ? ? ?Hand Dominance  ?   ? ?  ?Extremity/Trunk Assessment  ? Upper Extremity Assessment ?Upper Extremity Assessment: Overall WFL for tasks assessed (ROM WFL, strength WFL able to complete fine motor tasks with increased time) ?  ? ?Lower Extremity Assessment ?Lower Extremity Assessment:  (lewer leg edema) ?RLE Deficits / Details: knee remained flexed and leg dragged behind ?  ? ?Cervical / Trunk Assessment ?Cervical / Trunk Assessment: Other exceptions ?Cervical / Trunk Exceptions: noted  forward flexed posture when seated and standing,  ?Communication  ? Communication: No difficulties  ?Cognition   ?  ?  ?  ?  ?  ?  ?  ?  ?  ?  ?  ?  ?  ?  ?  ?  ?  ?  ?  ?  ?  ? ?  ?General Comments   ? ?  ?Exercises    ? ?Assessment/Plan  ?  ?PT Assessment Patient needs continued PT services  ?PT Problem List Decreased strength;Decreased mobility;Decreased safety awareness;Impaired tone;Decreased coordination;Decreased knowledge of precautions;Decreased activity tolerance;Decreased balance;Decreased knowledge of use of DME ? ?   ?  ?PT Treatment Interventions DME instruction;Therapeutic activities;Gait training;Therapeutic exercise;Patient/family education;Balance training;Functional mobility training   ? ?PT Goals (Current goals can be found in the Care Plan section)  ?Acute Rehab PT Goals ?Patient Stated Goal: go home ?PT Goal Formulation: With patient/family ?Time For Goal Achievement: 01/15/22 ?Potential to Achieve Goals: Fair ? ?  ?Frequency Min  2X/week ?  ? ? ?Co-evaluation PT/OT/SLP Co-Evaluation/Treatment: Yes ?Reason for Co-Treatment: Complexity of the patient's impairments (multi-system involvement);For patient/therapist safety ?PT goals addressed during session: Mobility/safety with mobility ?OT goals addressed during session: ADL's and self-care ?  ? ? ?  ?AM-PAC PT "6 Clicks" Mobility  ?Outcome Measure Help needed turning from your back to your side while in a flat bed without using bedrails?: A Lot ?Help needed moving from lying on your back to sitting on the side of a flat bed without using bedrails?: A Lot ?Help needed moving to and from a bed to a chair (including a wheelchair)?: A Lot ?Help needed standing up from a chair using your arms (e.g., wheelchair or bedside chair)?: A Lot ?Help needed to walk in hospital room?: Total ?Help needed climbing 3-5 steps with a railing? : Total ?6 Click Score: 10 ? ?  ?End of Session Equipment Utilized During Treatment: Gait belt ?Activity Tolerance: Treatment limited secondary to medical complications (Comment) (decreased contol of legs) ?Patient left: in chair;with call bell/phone within reach;with chair alarm set;with family/visitor present ?Nurse Communication: Mobility status ?PT Visit Diagnosis: Unsteadiness on feet (R26.81);Other abnormalities of gait and mobility (R26.89);Other symptoms and signs involving the nervous system (R29.898);Difficulty in walking, not elsewhere classified (R26.2) ?  ? ?Time: 8182-9937 ?PT Time Calculation (min) (ACUTE  ONLY): 20 min ? ? ?Charges:   PT Evaluation ?$PT Eval Low Complexity: 1 Low ?  ?  ?   ? ? ?Tresa Endo PT ?Acute Rehabilitation Services ?Pager [phone] ?Office [phone] ? ? ?Claretha Cooper ?01/01/2022, 1:08 PM ? ?

## 2022-01-01 NOTE — Telephone Encounter (Signed)
Patient left message with the access nurse stating patient had a fever on Friday, pt has not been able to move since fri due to weakness.Caller spoke to pcp, suggested he be evaluated in the ED. ? ?Access nurse note in [place] ?12/31/2021 ?10:34AM ?

## 2022-01-01 NOTE — Evaluation (Signed)
Occupational Therapy Evaluation ?Patient Details ?[name] ?MRN: [id number] ?DOB: 1946-09-23 ?Today's Date: 01/01/2022 ? ? ?History of Present Illness Patient is a 76 year old male who presented with weakness and fever from home. patient was found to have UTI. PMH: HTN, chronic bilateral lower extremity edema, GERD, HLD, BPH, parkinson's disease  ? ?Clinical Impression ?  ?Patient is a 76 year old male who was admitted for above.  Patient was living at home with wife with independence in ADLs. Currently, patient is +2 for standing with RW with poor standing balance, max A for LB ADLs and all standing tasks. Patient was quick to fatigue and noted to have poor safety awareness. Patient would need 24/7 physical assistance to be able to transition successfully to next level of care. Patient would continue to benefit from skilled OT services at this time while admitted and after d/c to address noted deficits in order to improve overall safety and independence in ADLs.  ? ?   ? ?Recommendations for follow up therapy are one component of a multi-disciplinary discharge planning process, led by the attending physician.  Recommendations may be updated based on patient status, additional functional criteria and insurance authorization.  ? ?Follow Up Recommendations ? Skilled nursing-short term rehab (<3 hours/day)  ?  ?Assistance Recommended at Discharge Frequent or constant Supervision/Assistance  ?Patient can return home with the following Two people to help with bathing/dressing/bathroom;Two people to help with walking and/or transfers;Assistance with cooking/housework;Assist for transportation;Help with stairs or ramp for entrance ? ?  ?Functional Status Assessment ? Patient has had a recent decline in their functional status and demonstrates the ability to make significant improvements in function in a reasonable and predictable amount of time.  ?Equipment Recommendations ?    ?  ?Recommendations for Other Services    ? ? ?  ?Precautions / Restrictions Precautions ?Precautions: Fall ?Restrictions ?Weight Bearing Restrictions: No  ? ?  ? ?Mobility Bed Mobility ?Overal bed mobility: Needs Assistance ?  ?  ?  ?  ?  ?  ?General bed mobility comments: patient was in bathroom at start of session ?  ? ?Transfers ?  ?  ?  ?  ?  ?  ?  ?  ?  ?  ?  ? ?  ?Balance Overall balance assessment: Needs assistance ?Sitting-balance support: No upper extremity supported, Feet supported ?Sitting balance-Leahy Scale: Fair ?  ?  ?Standing balance support: Reliant on assistive device for balance, Bilateral upper extremity supported ?Standing balance-Leahy Scale: Zero ?Standing balance comment: needed physical assistance for maintaining upright posture ?  ?  ?  ?  ?  ?  ?  ?  ?  ?  ?  ?   ? ?ADL either performed or assessed with clinical judgement  ? ?ADL Overall ADL's : Needs assistance/impaired ?Eating/Feeding: Set up;Sitting ?  ?Grooming: Oral care;Sitting ?Grooming Details (indicate cue type and reason): in recliner ?Upper Body Bathing: Minimal assistance;Sitting ?  ?Lower Body Bathing: Maximal assistance;Sit to/from stand;Sitting/lateral leans ?  ?Upper Body Dressing : Minimal assistance;Sitting ?  ?Lower Body Dressing: Maximal assistance;Sitting/lateral leans;Sit to/from stand ?  ?Toilet Transfer: +2 for physical assistance;+2 for safety/equipment;Moderate assistance;Rolling walker (2 wheels);Stand-pivot ?  ?Toileting- Clothing Manipulation and Hygiene: +2 for physical assistance;+2 for safety/equipment;Minimal assistance ?  ?  ?  ?Functional mobility during ADLs: +2 for physical assistance;+2 for safety/equipment ?   ? ? ? ?Vision Baseline Vision/History: 1 Wears glasses ?Patient Visual Report: No change from baseline ?   ?   ?  Perception   ?  ?Praxis   ?  ? ?Pertinent Vitals/Pain Pain Assessment ?Pain Assessment: No/denies pain  ? ? ? ?Hand Dominance   ?  ?Extremity/Trunk Assessment Upper Extremity Assessment ?Upper Extremity Assessment: Overall  WFL for tasks assessed (ROM WFL, strength WFL able to complete fine motor tasks with increased time) ?  ?Lower Extremity Assessment ?Lower Extremity Assessment: Generalized weakness ?  ?Cervical / Trunk Assessment ?Cervical / Trunk Assessment: Normal ?  ?Communication Communication ?Communication: No difficulties ?  ?Cognition Arousal/Alertness: Awake/alert ?Behavior During Therapy: [facility] for tasks assessed/performed ?Overall Cognitive Status: Within Functional Limits for tasks assessed ?  ?  ?  ?  ?  ?  ?  ?  ?  ?  ?  ?  ?  ?  ?  ?  ?General Comments: patients wife was present in room as well. wife able to provide PLOF ?  ?  ?General Comments    ? ?  ?Exercises   ?  ?Shoulder Instructions    ? ? ?Home Living Family/patient expects to be discharged to:: Private residence ?Living Arrangements: Spouse/significant other ?Available Help at Discharge: Available PRN/intermittently;Family ?Type of Home: House ?Home Access: Stairs to enter ?Entrance Stairs-Number of Steps: 5 steps ?  ?Home Layout: Able to live on main level with bedroom/bathroom;Laundry or work area in basement ?  ?  ?Bathroom Shower/Tub: Walk-in shower ?  ?  ?  ?  ?Home Equipment: Kasandra Knudsen - single point;BSC/3in1;Shower seat - built in;Shower seat ?  ?  ?  ? ?  ?Prior Functioning/Environment Prior Level of Function : Independent/Modified Independent ?  ?  ?  ?  ?  ?  ?Mobility Comments: uses cane or walker on and off prior to admission, drives ?  ?  ? ?  ?  ?OT Problem List: Decreased activity tolerance;Impaired balance (sitting and/or standing);Decreased coordination;Decreased safety awareness;Decreased knowledge of use of DME or AE;Decreased knowledge of precautions ?  ?   ?OT Treatment/Interventions: Self-care/ADL training;Neuromuscular education;DME and/or AE instruction;Therapeutic activities;Therapeutic exercise;Energy conservation;Patient/family education;Balance training  ?  ?OT Goals(Current goals can be found in the care plan section) Acute Rehab OT  Goals ?Patient Stated Goal: to get back home ?OT Goal Formulation: With patient/family ?Time For Goal Achievement: 01/15/22 ?Potential to Achieve Goals: Good  ?OT Frequency: Min 2X/week ?  ? ?Co-evaluation PT/OT/SLP Co-Evaluation/Treatment: Yes ?Reason for Co-Treatment: Complexity of the patient's impairments (multi-system involvement);For patient/therapist safety ?PT goals addressed during session: Mobility/safety with mobility ?OT goals addressed during session: ADL's and self-care ?  ? ?  ?AM-PAC OT "6 Clicks" Daily Activity     ?Outcome Measure Help from another person eating meals?: A Little ?Help from another person taking care of personal grooming?: A Little ?Help from another person toileting, which includes using toliet, bedpan, or urinal?: Total ?Help from another person bathing (including washing, rinsing, drying)?: A Lot ?Help from another person to put on and taking off regular upper body clothing?: A Little ?Help from another person to put on and taking off regular lower body clothing?: A Lot ?6 Click Score: 14 ?  ?End of Session Equipment Utilized During Treatment: Gait belt;Rolling walker (2 wheels) ?Nurse Communication: Other (comment) (nurse cleared patient to participate in session) ? ?Activity Tolerance: Patient limited by fatigue ?Patient left: in chair;with call bell/phone within reach;with chair alarm set;with family/visitor present ? ?OT Visit Diagnosis: Unsteadiness on feet (R26.81);Other abnormalities of gait and mobility (R26.89);Muscle weakness (generalized) (M62.81)  ?              ?  Time: 6468-0321 ?OT Time Calculation (min): 24 min ?Charges:  OT General Charges ?$OT Visit: 1 Visit ?OT Evaluation ?$OT Eval Moderate Complexity: 1 Mod ? ?Masen Luallen OTR/L, MS ?Acute Rehabilitation Department ?Office# [phone] ?Pager# [phone] ? ? ?Feliz Beam Vasiliki Smaldone ?01/01/2022, 1:01 PM ?

## 2022-01-01 NOTE — Progress Notes (Signed)
MD Starla Link made aware of 9+ seconds SVT on tele monitor. Pt asymptomatic, will continue to monitor.  ?

## 2022-01-01 NOTE — Progress Notes (Addendum)
PHARMACY NOTE:  ANTIMICROBIAL RENAL DOSAGE ADJUSTMENT ? ?Current antimicrobial regimen includes a mismatch between antimicrobial dosage and estimated renal function.  As per policy approved by the Pharmacy & Therapeutics and Medical Executive Committees, the antimicrobial dosage will be adjusted accordingly. ? ?Current antimicrobial dosage:  meropenem 1g IV q12 hours ? ?Indication: possible multi-drug resistant UTI ? ?Renal Function: ? ?Estimated Creatinine Clearance: 51.1 mL/min (A) (by C-G formula based on SCr of 1.37 mg/dL (H)). ?'[]'$      On intermittent HD, scheduled: ?'[]'$      On CRRT ?   ?Antimicrobial dosage has been changed to:  meropenem 1g IV q8 hours for CrCl > 50 mL/min ? ?Additional comments:  ? ? ?Thank you for allowing pharmacy to be a part of this patient's care. ? ?Dimple Nanas, PharmD ?01/01/2022 10:11 AM ? ? ?  ? ?

## 2022-01-01 NOTE — NC FL2 (Signed)
? MEDICAID FL2 LEVEL OF CARE SCREENING TOOL  ?  ? ?IDENTIFICATION  ?Patient Name: ?[name] Birthdate: 1946-08-26 Sex: male Admission Date (Current Location): ?12/31/2021  ?[place] and [place] Number: ? [place] ?  Facility and Address:  ?[facility], [place] [address] [place], [place] [address] ?     Provider Number: ?[id number]  ?Attending Physician Name and Address:  ?Aline August, MD ? Relative Name and Phone Number:  ?  ?   ?Current Level of Care: ?Hospital Recommended Level of Care: ?[place] Prior Approval Number: ?  ? ?Date Approved/Denied: ?  PASRR Number: ?[id number] A ? ?Discharge Plan: ?SNF ?  ? ?Current Diagnoses: ?Patient Active Problem List  ? Diagnosis Date Noted  ? Leukocytosis 01/01/2022  ? Hyponatremia 01/01/2022  ? CKD (chronic kidney disease), stage III ([place]) 01/01/2022  ? HTN (hypertension) 12/31/2021  ? UTI (urinary tract infection) 12/31/2021  ? Generalized weakness 12/31/2021  ? Chronic venous insufficiency 08/29/2021  ? 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 Abnormal involuntary movement 02/18/2018  ? Encounter for general adult medical examination with abnormal findings 02/18/2018  ? Enlarged prostate 02/18/2018  ? Hearing loss 02/18/2018  ? Other long term (current) drug therapy 02/18/2018  ? Type 2 diabetes mellitus with hyperglycemia ([place]) 02/18/2018  ? Reducible left inguinal hernia 11/03/2015  ? HLD (hyperlipidemia)   ? Nephrolithiasis   ? Parkinson's disease ([place])   ? Diabetes mellitus with coincident hypertension ([place])   ? Syncope   ? Renal calculus   ? Diabetes ([place])   ? Allergic rhinitis   ? BPH (benign prostatic hyperplasia)   ? Erectile dysfunction   ? Dysphagia, pharyngeal phase   ? Edema   ? Presbycusis   ? Onychomycosis of toenail   ? Ureteral calculus 02/09/2013  ? LEG PAIN 03/14/2010  ? ? ?Orientation RESPIRATION BLADDER Height & Weight   ?  ?Self, Time, Situation, Place ? Normal Incontinent, External catheter  Weight: 193 lb (87.5 kg) ?Height:  6' (182.9 cm)  ?BEHAVIORAL SYMPTOMS/MOOD NEUROLOGICAL BOWEL NUTRITION STATUS  ?    Continent Diet (See d/c summary)  ?AMBULATORY STATUS COMMUNICATION OF NEEDS Skin   ?Extensive Assist Verbally Normal ?  ?  ?  ?    ?     ?     ? ? ?Personal Care Assistance Level of Assistance  ?Bathing, Dressing Bathing Assistance: Limited assistance ?Feeding assistance: Independent ?Dressing Assistance: Limited assistance ?   ? ?Functional Limitations Info  ?Sight, Hearing, Speech Sight Info: Adequate ?Hearing Info: Adequate ?Speech Info: Adequate  ? ? ?SPECIAL CARE FACTORS FREQUENCY  ?PT (By licensed PT), OT (By licensed OT)   ?  ?PT Frequency: 5x/week ?OT Frequency: 5x/week ?  ?  ?  ?   ? ? ?Contractures Contractures Info: Not present  ? ? ?Additional Factors Info  ?Code Status, Allergies Code Status Info: DNR ?Allergies Info: oxycodone, Sudafed (pseudoephedrine Hcl) ?  ?  ?  ?   ? ?Current Medications (01/01/2022):  This is the current hospital active medication list ?Current Facility-Administered Medications  ?Medication Dose Route Frequency Provider Last Rate Last Admin  ? 0.9 %  sodium chloride infusion   Intravenous Continuous Polly Cobia, RPH 75 mL/hr at 01/01/22 0854 New Bag at 01/01/22 0854  ? acetaminophen (TYLENOL) tablet 650 mg  650 mg Oral Q6H PRN Cherylann Ratel A, DO   650 mg at 01/01/22 6834  ? Or  ? acetaminophen (TYLENOL) suppository 650 mg  650  mg Rectal Q6H PRN Cherylann Ratel A, DO      ? amantadine (SYMMETREL) capsule 100 mg  100 mg Oral TID Marylyn Ishihara, Tyrone A, DO   100 mg at 01/01/22 1048  ? bisacodyl (DULCOLAX) suppository 10 mg  10 mg Rectal Daily PRN Aline August, MD      ? carbidopa-levodopa (SINEMET IR) 25-100 MG per tablet immediate release 3 tablet  3 tablet Oral QID Marylyn Ishihara, Tyrone A, DO   3 tablet at 01/01/22 1048  ? enoxaparin (LOVENOX) injection 40 mg  40 mg Subcutaneous Q24H Kyle, Tyrone A, DO   40 mg at 12/31/21 2132  ? entacapone (COMTAN) tablet 200 mg  200 mg Oral TID  Marylyn Ishihara, Tyrone A, DO   200 mg at 01/01/22 1049  ? furosemide (LASIX) tablet 60 mg  60 mg Oral BID Marylyn Ishihara, Tyrone A, DO   60 mg at 01/01/22 5697  ? guaiFENesin-dextromethorphan (ROBITUSSIN DM) 100-10 MG/5ML syrup 5 mL  5 mL Oral Q4H PRN Marylyn Ishihara, Tyrone A, DO   5 mL at 01/01/22 0244  ? insulin aspart (novoLOG) injection 0-5 Units  0-5 Units Subcutaneous QHS Kyle, Tyrone A, DO      ? insulin aspart (novoLOG) injection 0-9 Units  0-9 Units Subcutaneous TID WC Kyle, Tyrone A, DO   2 Units at 01/01/22 1229  ? insulin glargine-yfgn [facility]) injection 15 Units  15 Units Subcutaneous QHS Polly Cobia, [place]   15 Units at 12/31/21 2151  ? lisinopril (ZESTRIL) tablet 5 mg  5 mg Oral Daily Kyle, Tyrone A, DO   5 mg at 01/01/22 1048  ? Loteprednol Etabonate 0.38 % GEL 1 drop  1 drop Both Eyes BID Marylyn Ishihara, Tyrone A, DO   1 drop at 01/01/22 1050  ? meropenem (MERREM) 1 g in sodium chloride 0.9 % 100 mL IVPB  1 g Intravenous Q8H Dimple Nanas, RPH      ? polyethylene glycol (MIRALAX / GLYCOLAX) packet 17 g  17 g Oral Daily PRN Aline August, MD      ? rOPINIRole (REQUIP) tablet 1.5 mg  1.5 mg Oral QHS Polly Cobia, RPH   1.5 mg at 12/31/21 2131  ? rOPINIRole (REQUIP) tablet 3 mg  3 mg Oral BID Polly Cobia, RPH   3 mg at 01/01/22 9480  ? senna-docusate (Senokot-S) tablet 1 tablet  1 tablet Oral BID Aline August, MD   1 tablet at 01/01/22 1229  ? simvastatin (ZOCOR) tablet 40 mg  40 mg Oral QPM Kyle, Tyrone A, DO   40 mg at 12/31/21 2131  ? tamsulosin (FLOMAX) capsule 0.8 mg  0.8 mg Oral Daily Kyle, Tyrone A, DO   0.8 mg at 01/01/22 1048  ? triamcinolone (NASACORT) nasal inhaler 2 spray  2 spray Nasal Daily Kyle, Tyrone A, DO   2 spray at 01/01/22 1049  ? ? ? ?Discharge Medications: ?Please see discharge summary for a list of discharge medications. ? ?Relevant Imaging Results: ? ?Relevant Lab Results: ? ? ?Additional Information ?SSN# [id number] ? ?[place], LCSW ? ? ? ? ?

## 2022-01-01 NOTE — TOC Initial Note (Signed)
Transition of Care (TOC) - Initial/Assessment Note  ? ? ?Patient Details  ?[name] ?MRN: [id number] ?Date of Birth: [birth year] ? ?Transition of Care (TOC) CM/SW Contact:    ?[place], LCSW ?Phone Number: ?01/01/2022, 2:16 PM ? ?Clinical Narrative:                 ? ?CSW met with pt and pt daughter bedside to discuss disposition. CSW explained PT recommendation for SNF. Pt and pt daughter would prefer pt to go home with [facility]; they would like to see how he improves in the next few days. They are agreeable to SNF workup as backup plan but seem most interested in home with [facility]. Pt lives at home with Spouse and has 2 son's who live across the st. Pt's daughter states she can be available for help as well. She states that arranging 24/7 supervision would not be a problem. Pt has a rolling walker at home and lives in a one story home with an accessible bathroom. They have no preference for [facility] agency. CSW will continue to follow for DC needs.  ? ?Expected Discharge Plan: [place] ?Barriers to Discharge: Continued Medical Work up ? ? ?Patient Goals and CMS Choice ?  ?  ?  ? ?Expected Discharge Plan and Services ?Expected Discharge Plan: [place] ?  ?  ?  ?Living arrangements for the past 2 months: [place] ?                ?  ?  ?  ?  ?  ?  ?  ?  ?  ?  ? ?Prior Living Arrangements/Services ?Living arrangements for the past 2 months: [place] ?Lives with:: Spouse ?  ?       ?Need for Family Participation in Patient Care: No (Comment) ?Care giver support system in place?: Yes (comment) ?  ?  ? ?Activities of Daily Living ?Home Assistive Devices/Equipment: Chana Bode (specify type) ?ADL Screening (condition at time of admission) ?Patient's cognitive ability adequate to safely complete daily activities?: Yes ?Is the patient deaf or have difficulty hearing?: No ?Does the patient have difficulty seeing, even when wearing glasses/contacts?: No ?Does the  patient have difficulty concentrating, remembering, or making decisions?: No ?Patient able to express need for assistance with ADLs?: Yes ?Does the patient have difficulty dressing or bathing?: Yes ?Independently performs ADLs?: No ?Communication: Independent ?Dressing (OT): Needs assistance ?Is this a change from baseline?: Change from baseline, expected to last <3days ?Grooming: Independent ?Feeding: Independent ?Bathing: Needs assistance ?Is this a change from baseline?: Change from baseline, expected to last <3 days ?Toileting: Needs assistance ?Is this a change from baseline?: Change from baseline, expected to last <3 days ?In/Out Bed: Needs assistance ?Is this a change from baseline?: Change from baseline, expected to last <3 days ?Walks in Home: Needs assistance ?Is this a change from baseline?: Change from baseline, expected to last <3 days ?Does the patient have difficulty walking or climbing stairs?: Yes ?Weakness of Legs: Both ?Weakness of Arms/Hands: Both ? ?Permission Sought/Granted ?  ?  ?   ?   ?   ?   ? ?Emotional Assessment ?Appearance:: Appears stated age ?Attitude/Demeanor/Rapport: Engaged ?Affect (typically observed): Accepting ?Orientation: : Oriented to Self, Oriented to Place, Oriented to  Time, Oriented to Situation ?Alcohol / Substance Use: Not Applicable ?Psych Involvement: No (comment) ? ?Admission diagnosis:  UTI (urinary tract infection) [N39.0] ?Weakness [R53.1] ?Fever, unspecified  fever cause [R50.9] ?Patient Active Problem List  ? Diagnosis Date Noted  ? Leukocytosis 01/01/2022  ? Hyponatremia 01/01/2022  ? CKD (chronic kidney disease), stage III ([place]) 01/01/2022  ? HTN (hypertension) 12/31/2021  ? UTI (urinary tract infection) 12/31/2021  ? Generalized weakness 12/31/2021  ? Chronic venous insufficiency 08/29/2021  ? 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 Abnormal involuntary movement 02/18/2018  ? Encounter for general adult medical examination with abnormal findings  02/18/2018  ? Enlarged prostate 02/18/2018  ? Hearing loss 02/18/2018  ? Other long term (current) drug therapy 02/18/2018  ? Type 2 diabetes mellitus with hyperglycemia ([place]) 02/18/2018  ? Reducible left inguinal hernia 11/03/2015  ? HLD (hyperlipidemia)   ? Nephrolithiasis   ? Parkinson's disease ([place])   ? Diabetes mellitus with coincident hypertension ([place])   ? Syncope   ? Renal calculus   ? Diabetes ([place])   ? Allergic rhinitis   ? BPH (benign prostatic hyperplasia)   ? Erectile dysfunction   ? Dysphagia, pharyngeal phase   ? Edema   ? Presbycusis   ? Onychomycosis of toenail   ? Ureteral calculus 02/09/2013  ? LEG PAIN 03/14/2010  ? ?PCP:  Josetta Huddle, MD ?Pharmacy:   ?[facility], [place] [address] ?[address] ?[name] [address] ?Phone: [phone] Fax: [phone] ? ? ? ? ?Social Determinants of Health (SDOH) Interventions ?  ? ?Readmission Risk Interventions ?No flowsheet data found. ? ? ?

## 2022-01-01 NOTE — Plan of Care (Signed)
  Problem: Education: Goal: Knowledge of General Education information will improve Description Including pain rating scale, medication(s)/side effects and non-pharmacologic comfort measures Outcome: Progressing   Problem: Clinical Measurements: Goal: Ability to maintain clinical measurements within normal limits will improve Outcome: Progressing Goal: Will remain free from infection Outcome: Progressing Goal: Diagnostic test results will improve Outcome: Progressing Goal: Respiratory complications will improve Outcome: Progressing Goal: Cardiovascular complication will be avoided Outcome: Progressing   Problem: Elimination: Goal: Will not experience complications related to bowel motility Outcome: Progressing Goal: Will not experience complications related to urinary retention Outcome: Progressing   Problem: Pain Managment: Goal: General experience of comfort will improve Outcome: Progressing   Problem: Safety: Goal: Ability to remain free from injury will improve Outcome: Progressing   Problem: Skin Integrity: Goal: Risk for impaired skin integrity will decrease Outcome: Progressing   

## 2022-01-01 NOTE — Telephone Encounter (Signed)
Hospital records indicate a UTI will call to check on patient today  ?

## 2022-01-02 DIAGNOSIS — N1831 Chronic kidney disease, stage 3a: Secondary | ICD-10-CM

## 2022-01-02 DIAGNOSIS — N3000 Acute cystitis without hematuria: Secondary | ICD-10-CM | POA: Diagnosis not present

## 2022-01-02 DIAGNOSIS — E871 Hypo-osmolality and hyponatremia: Secondary | ICD-10-CM

## 2022-01-02 DIAGNOSIS — N400 Enlarged prostate without lower urinary tract symptoms: Secondary | ICD-10-CM | POA: Diagnosis not present

## 2022-01-02 DIAGNOSIS — R531 Weakness: Secondary | ICD-10-CM | POA: Diagnosis not present

## 2022-01-02 LAB — BASIC METABOLIC PANEL
Anion gap: 7 (ref 5–15)
BUN: 24 mg/dL — ABNORMAL HIGH (ref 8–23)
CO2: 27 mmol/L (ref 22–32)
Calcium: 8.3 mg/dL — ABNORMAL LOW (ref 8.9–10.3)
Chloride: 102 mmol/L (ref 98–111)
Creatinine, Ser: 1.18 mg/dL (ref 0.61–1.24)
GFR, Estimated: >60 mL/min (ref >60)
Glucose, Bld: 156 mg/dL — ABNORMAL HIGH (ref 70–99)
Potassium: 3.4 mmol/L — ABNORMAL LOW (ref 3.5–5.1)
Sodium: 136 mmol/L (ref 135–145)

## 2022-01-02 LAB — CBC WITH DIFFERENTIAL/PLATELET
Abs Immature Granulocytes: 0.03 10*3/uL (ref 0.00–0.07)
Basophils Absolute: 0 10*3/uL (ref 0.0–0.1)
Basophils Relative: 0 %
Eosinophils Absolute: 0.1 10*3/uL (ref 0.0–0.5)
Eosinophils Relative: 1 %
HCT: 42.4 % (ref 39.0–52.0)
Hemoglobin: 13.6 g/dL (ref 13.0–17.0)
Immature Granulocytes: 0 %
Lymphocytes Relative: 9 %
Lymphs Abs: 0.7 10*3/uL (ref 0.7–4.0)
MCH: 29.1 pg (ref 26.0–34.0)
MCHC: 32.1 g/dL (ref 30.0–36.0)
MCV: 90.6 fL (ref 80.0–100.0)
Monocytes Absolute: 0.7 10*3/uL (ref 0.1–1.0)
Monocytes Relative: 10 %
Neutro Abs: 5.9 10*3/uL (ref 1.7–7.7)
Neutrophils Relative %: 80 %
Platelets: 201 10*3/uL (ref 150–400)
RBC: 4.68 MIL/uL (ref 4.22–5.81)
RDW: 14.3 % (ref 11.5–15.5)
WBC: 7.4 10*3/uL (ref 4.0–10.5)
nRBC: 0 % (ref 0.0–0.2)

## 2022-01-02 LAB — GLUCOSE, CAPILLARY
Glucose-Capillary: 173 mg/dL — ABNORMAL HIGH (ref 70–99)
Glucose-Capillary: 197 mg/dL — ABNORMAL HIGH (ref 70–99)
Glucose-Capillary: 186 mg/dL — ABNORMAL HIGH (ref 70–99)
Glucose-Capillary: 234 mg/dL — ABNORMAL HIGH (ref 70–99)

## 2022-01-02 LAB — MAGNESIUM: Magnesium: 2 mg/dL (ref 1.7–2.4)

## 2022-01-02 MED ORDER — MIRTAZAPINE 15 MG PO TABS
7.5000 mg | ORAL_TABLET | Freq: Every day | ORAL | Status: DC
  Administered 2022-01-02 – 2022-01-05 (×3): 7.5 mg via ORAL
  Filled 2022-01-02 (×3): qty 1

## 2022-01-02 MED ORDER — POTASSIUM CHLORIDE CRYS ER 20 MEQ PO TBCR
40.0000 meq | EXTENDED_RELEASE_TABLET | Freq: Once | ORAL | Status: AC
Start: 2022-01-02 — End: 2022-01-02
  Administered 2022-01-02: 40 meq via ORAL
  Filled 2022-01-02: qty 2

## 2022-01-02 NOTE — Progress Notes (Signed)
?PROGRESS NOTE ? ? ? ?[name]  KXF:[id number] DOB: [birth year] DOA: 12/31/2021 ?PCP: Josetta Huddle, MD  ? ?Brief Narrative:  ?76 y.o. male with medical history significant of HTN, chronic bilateral lower extremity edema, GERD, HLD, BPH, parkinson's disease presented with weakness and fevers at home.  On presentation, UA was positive for leukocyte esterase and nitrites.  Chest x-ray was negative for consolidation.  He was started on IV fluids and antibiotics. ? ?Assessment & Plan: ?  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  Currently on meropenem. ?-Blood cultures negative so far.  COVID-19/influenza negative on presentation.  Urine culture pending ? ?Generalized weakness ?-Possibly from above.  PT recommends SNF placement.  Patient/family hesitant at this time.  Social worker following. ? ?Parkinson's disease ?-Continue Sinemet, amantadine and entacapone.  Outpatient follow-up with neurology ? ?Hypertension ?-Continue lisinopril and Lasix.  Monitor blood pressure ? ?Hyperlipidemia ?Continue simvastatin ? ?BPH ?-Continue tamsulosin ? ?Diabetes mellitus type 2 ?-A1c 6.4.  Continue Lantus along with CBGs with SSI. ? ?Hyponatremia ?-Resolved ? ?Hypokalemia ?-Replace. ? ?Leukocytosis ?-Resolved ? ?Chronic kidney disease stage IIIa ?-Creatinine stable.  Monitor ? ? ?DVT prophylaxis: Lovenox ?Code Status: DNR ?Family Communication: Wife at bedside ?Disposition Plan: ?Status is: Inpatient ?Remains inpatient appropriate because: Of need for IV antibiotics.  History of MDR UTI in the past.  Follow cultures.  PT recommending SNF. ? ? ? ?Consultants: None ? ?Procedures: None ? ?Antimicrobials: Meropenem from 12/31/2021 onwards ? ? ?Subjective: ?Patient seen and examined at bedside.  Poor historian.  Wife at bedside.  No worsening shortness of breath, fever, vomiting reported.  Still feels weak.  Patient ? ?Objective: ?Vitals:  ? 01/01/22 0559 01/01/22 1347 01/01/22 2100 01/02/22 0515   ?BP: 114/71 108/65 112/65 (!) 142/80  ?Pulse: (!) 56 66 87 62  ?Resp: '20 20 19 17  '$ ?Temp: 99 ?F (37.2 ?C) 97.6 ?F (36.4 ?C) 99.1 ?F (37.3 ?C) 98.9 ?F (37.2 ?C)  ?TempSrc: Oral Oral Oral Oral  ?SpO2: 97% 99% 97% 97%  ?Weight:      ?Height:      ? ? ?Intake/Output Summary (Last 24 hours) at 01/02/2022 0801 ?Last data filed at 01/02/2022 0516 ?Gross per 24 hour  ?Intake 3598.1 ml  ?Output 2300 ml  ?Net 1298.1 ml  ? ? ?Filed Weights  ? 12/31/21 1153  ?Weight: 87.5 kg  ? ? ?Examination: ? ?General exam: On room air currently.  No distress.  Looks chronically ill and deconditioned. ?Respiratory system: Decreased breath sounds at bases bilaterally with some scattered crackles  ?cardiovascular system: Currently rate controlled; S1-S2 heard  ?gastrointestinal system: Abdomen is distended slightly; soft and nontender.  Bowel sounds are heard  ?extremities: Mild lower extremity edema present; no clubbing  ?Central nervous system: Still very slow to respond; awake.  Poor historian.  No focal neurological deficits.  Moves extremities.   ?Skin: No obvious ecchymosis/lesions ?Psychiatry: Currently not agitated.  Flat affect.  Does not participate in conversation much. ? ?Data Reviewed: I have personally reviewed following labs and imaging studies ? ?CBC: ?Recent Labs  ?Lab 12/31/21 ?1330 01/01/22 ?6967 01/02/22 ?8938  ?WBC 14.3* 10.0 7.4  ?NEUTROABS 12.8*  --  5.9  ?HGB 14.8 13.5 13.6  ?HCT 45.9 42.1 42.4  ?MCV 91.6 91.7 90.6  ?PLT 193 181 201  ? ? ?Basic Metabolic Panel: ?Recent Labs  ?Lab 12/31/21 ?1330 01/01/22 ?1017 01/02/22 ?5102  ?NA 132* 136 136  ?K 3.8 3.9 3.4*  ?CL 97* 103 102  ?  CO2 '26 26 27  '$ ?GLUCOSE 146* 125* 156*  ?BUN 24* 24* 24*  ?CREATININE 1.44* 1.37* 1.18  ?CALCIUM 9.3 8.6* 8.3*  ?MG  --   --  2.0  ? ? ?GFR: ?Estimated Creatinine Clearance: 59.4 mL/min (by C-G formula based on SCr of 1.18 mg/dL). ?Liver Function Tests: ?Recent Labs  ?Lab 12/31/21 ?1330 01/01/22 ?9326  ?AST 22 37  ?ALT 9 <5  ?ALKPHOS 102 89   ?BILITOT 0.6 0.5  ?PROT 7.5 5.9*  ?ALBUMIN 3.5 2.7*  ? ? ?No results for input(s): LIPASE, AMYLASE in the last 168 hours. ?No results for input(s): AMMONIA in the last 168 hours. ?Coagulation Profile: ?No results for input(s): INR, PROTIME in the last 168 hours. ?Cardiac Enzymes: ?No results for input(s): CKTOTAL, CKMB, CKMBINDEX, TROPONINI in the last 168 hours. ?BNP (last 3 results) ?No results for input(s): PROBNP in the last 8760 hours. ?HbA1C: ?Recent Labs  ?  01/01/22 ?7124  ?HGBA1C 6.4*  ? ? ?CBG: ?Recent Labs  ?Lab 01/01/22 ?5809 01/01/22 ?1155 01/01/22 ?1651 01/01/22 ?2147 01/02/22 ?0739  ?GLUCAP 162* 154* 241* 172* 173*  ? ? ?Lipid Profile: ?No results for input(s): CHOL, HDL, LDLCALC, TRIG, CHOLHDL, LDLDIRECT in the last 72 hours. ?Thyroid Function Tests: ?No results for input(s): TSH, T4TOTAL, FREET4, T3FREE, THYROIDAB in the last 72 hours. ?Anemia Panel: ?No results for input(s): VITAMINB12, FOLATE, FERRITIN, TIBC, IRON, RETICCTPCT in the last 72 hours. ?Sepsis Labs: ?Recent Labs  ?Lab 12/31/21 ?1339  ?LATICACIDVEN 1.4  ? ? ? ?Recent Results (from the past 240 hour(s))  ?Resp Panel by RT-PCR (Flu A&B, Covid) Nasopharyngeal Swab     Status: None  ? Collection Time: 12/31/21  1:29 PM  ? Specimen: Nasopharyngeal Swab; Nasopharyngeal(NP) swabs in vial transport medium  ?Result Value Ref Range Status  ? SARS Coronavirus 2 by RT PCR NEGATIVE NEGATIVE Final  ?  Comment: (NOTE) ?SARS-CoV-2 target nucleic acids are NOT DETECTED. ? ?The SARS-CoV-2 RNA is generally detectable in upper respiratory ?specimens during the acute phase of infection. The lowest ?concentration of SARS-CoV-2 viral copies this assay can detect is ?138 copies/mL. A negative result does not preclude SARS-Cov-2 ?infection and should not be used as the sole basis for treatment or ?other patient management decisions. A negative result may occur with  ?improper specimen collection/handling, submission of specimen other ?than nasopharyngeal swab,  presence of viral mutation(s) within the ?areas targeted by this assay, and inadequate number of viral ?copies(<138 copies/mL). A negative result must be combined with ?clinical observations, patient history, and epidemiological ?information. The expected result is Negative. ? ?Fact Sheet for Patients:  ?EntrepreneurPulse.com.au ? ?Fact Sheet for Healthcare Providers:  ?IncredibleEmployment.be ? ?This test is no t yet approved or cleared by the [place] FDA and  ?has been authorized for detection and/or diagnosis of SARS-CoV-2 by ?FDA under an Emergency Use Authorization (EUA). This EUA will remain  ?in effect (meaning this test can be used) for the duration of the ?COVID-19 declaration under Section 564(b)(1) of the Act, 21 ?U.S.C.section 360bbb-3(b)(1), unless the authorization is terminated  ?or revoked sooner.  ? ? ?  ? Influenza A by PCR NEGATIVE NEGATIVE Final  ? Influenza B by PCR NEGATIVE NEGATIVE Final  ?  Comment: (NOTE) ?The Xpert Xpress SARS-CoV-2/FLU/RSV plus assay is intended as an aid ?in the diagnosis of influenza from Nasopharyngeal swab specimens and ?should not be used as a sole basis for treatment. Nasal washings and ?aspirates are unacceptable for Xpert Xpress SARS-CoV-2/FLU/RSV ?testing. ? ?Fact Sheet for Patients: ?  EntrepreneurPulse.com.au ? ?Fact Sheet for Healthcare Providers: ?IncredibleEmployment.be ? ?This test is not yet approved or cleared by the [place] FDA and ?has been authorized for detection and/or diagnosis of SARS-CoV-2 by ?FDA under an Emergency Use Authorization (EUA). This EUA will remain ?in effect (meaning this test can be used) for the duration of the ?COVID-19 declaration under Section 564(b)(1) of the Act, 21 U.S.C. ?section 360bbb-3(b)(1), unless the authorization is terminated or ?revoked. ? ?Performed at [facility], [address] ?[place],  [id number] ?   ?Culture, blood (routine x 2)     Status: None (Preliminary result)  ? Collection Time: 12/31/21  1:34 PM  ? Specimen: BLOOD  ?Result Value Ref Range Status  ? Specimen Description   Final  ?  BLOOD RIGHT ANTECUBITAL

## 2022-01-02 NOTE — Progress Notes (Deleted)
?  ?  Durable Medical Equipment  ?(From admission, onward)  ?  ? ? ?  ? ?  Start     Ordered  ? 01/02/22 1317  For home use only DME Hospital bed  Once       ?Question Answer Comment  ?Length of Need 6 Months   ?Bed type Semi-electric   ?  ? 01/02/22 1316  ? ?  ?  ? ?  ?  ?

## 2022-01-02 NOTE — TOC Progression Note (Addendum)
Transition of Care (TOC) - Progression Note  ? ? ?Patient Details  ?[name] ?MRN: [id number] ?Date of Birth: Jul 30, 1946 ? ?Transition of Care (TOC) CM/SW Contact  ?[place], LCSW ?Phone Number: ?01/02/2022, 1:14 PM ? ?Clinical Narrative:    ? ?CSW met with pt spouse and provided SNF bed offers. Discussed SNF vs HH. Spouse states they spoke with [facility] and [facility] communicated to them that they currently don't have beds/staffing to accept pt. CSW explained [facility] health services and explained that if pt needs additional aide, family would need to provide care or they could pay out of pocket for private care givers. Spouse stated that providing care at home wouldn't be a problem. Pt has 2 son's who live across the street who can assist and they will also work on Engineer, maintenance (IT) care givers. Spouse states they have a wheelchair, bedside commode and a walker at home but does indicate interest in a hospital bed. They would like HH with [facility] first choice and [facility] as second choice. Spouse requests CSW speak with daughter Anderson [place] [phone], regarding hospital bed as she is a Marine scientist. CSW called daughter; daughter confirmed plan for pt to return home with [facility] and does request hospital bed. MD notified. CSW ordered hospital bed through [facility]. CSW contacted [facility] and [facility]; both don't have staff availability and can't accept referral.  ? ?[facility] pt for [facility]. CSW notified pt spouse and she is agreeable.  ? ?Expected Discharge Plan: [place] ?Barriers to Discharge: Continued Medical Work up ? ?Expected Discharge Plan and Services ?Expected Discharge Plan: [place] ?  ?  ?  ?Living arrangements for the past 2 months: [place] ?                ?  ?  ?  ?  ?  ?  ?  ?  ?  ?  ? ? ?Social Determinants of Health (SDOH) Interventions ?  ? ?Readmission Risk Interventions ?No flowsheet data found. ? ?

## 2022-01-02 NOTE — Final Progress Note (Signed)
?  ?  Durable Medical Equipment  ?(From admission, onward)  ?  ? ? ?  ? ?  Start     Ordered  ? 01/02/22 1338  For home use only DME Hospital bed  Once       ?Question Answer Comment  ?Length of Need 6 Months   ?The above medical condition requires: Patient requires the ability to reposition frequently   ?Head must be elevated greater than: 30 degrees   ?Bed type Semi-electric   ?  ? 01/02/22 1337  ? ?  ?  ? ?  ?  ?

## 2022-01-02 NOTE — Consult Note (Signed)
I have been asked to see the patient by Dr. Aline August, for evaluation and management of chronic urinary retention and recurrent infections. ? ?History of present illness: ?76 year old male with a history of Parkinson's disease and urinary retention who presented to the emergency department 4 days later with generalized weakness and malaise.  Further evaluation demonstrated likely urinary tract infection. ? ?The patient has a history of multidrug-resistant UTIs requiring IV antibiotics.  He is currently on meropenem. ? ?The patient has a past urologic history of kidney stones many years ago and then urinary retention.  He has had numerous Foley catheters over the course of the last 12 months.  3 to 4 months ago the patient underwent a bladder outlet procedure.  Ultimately 2 months ago the patient was able to have the catheter removed and was doing reasonably well up until now. ? ?The patient is feeling better, but weak.  He is unable to walk.  He feels more energized with better appetite.  He does not have a Foley catheter. ? ?Review of systems: A 12 point comprehensive review of systems was obtained and is negative unless otherwise stated in the history of present illness. ? ?Patient Active Problem List  ? Diagnosis Date Noted  ? Leukocytosis 01/01/2022  ? Hyponatremia 01/01/2022  ? CKD (chronic kidney disease), stage III ([place]) 01/01/2022  ? HTN (hypertension) 12/31/2021  ? UTI (urinary tract infection) 12/31/2021  ? Generalized weakness 12/31/2021  ? Chronic venous insufficiency 08/29/2021  ? 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 Abnormal involuntary movement 02/18/2018  ? Encounter for general adult medical examination with abnormal findings 02/18/2018  ? Enlarged prostate 02/18/2018  ? Hearing loss 02/18/2018  ? Other long term (current) drug therapy 02/18/2018  ? Type 2 diabetes mellitus with hyperglycemia ([place]) 02/18/2018  ? Reducible left inguinal hernia 11/03/2015  ? HLD (hyperlipidemia)   ?  Nephrolithiasis   ? Parkinson's disease ([place])   ? Diabetes mellitus with coincident hypertension ([place])   ? Syncope   ? Renal calculus   ? Diabetes ([place])   ? Allergic rhinitis   ? BPH (benign prostatic hyperplasia)   ? Erectile dysfunction   ? Dysphagia, pharyngeal phase   ? Edema   ? Presbycusis   ? Onychomycosis of toenail   ? Ureteral calculus 02/09/2013  ? LEG PAIN 03/14/2010  ? ? ?No current facility-administered medications on file prior to encounter.  ? ?Current Outpatient Medications on File Prior to Encounter  ?Medication Sig Dispense Refill  ? acetaminophen (TYLENOL) 500 MG tablet Take 500 mg by mouth every 6 (six) hours as needed for fever.    ? amantadine (SYMMETREL) 100 MG capsule TAKE 1 CAPSULE BY MOUTH THREE TIMES A DAY (Patient taking differently: Take 100 mg by mouth 3 (three) times daily.) 270 capsule 0  ? carbidopa-levodopa (SINEMET IR) 25-100 MG tablet 3 at 6am, 3 at 10am, 3 at 2pm, 3 at 6pm (Patient taking differently: Take 3 tablets by mouth 4 (four) times daily. 3 at 6am, 3 at 10am, 3 at 2pm, 3 at 6pm) 1080 tablet 1  ? cholecalciferol (VITAMIN D) 1000 UNITS tablet Take 2,000 Units by mouth daily.    ? Doxycycline Hyclate 50 MG TBEC Take 50 capsules by mouth daily. contineous    ? entacapone (COMTAN) 200 MG tablet Take 1 tablet (200 mg total) by mouth 3 (three) times daily. 1 tablet with first THREE dosages of levodopa (Patient taking differently: Take 200 mg by mouth 3 (three) times daily.) 270  tablet 2  ? furosemide (LASIX) 20 MG tablet Take 3 tablets (60 mg total) by mouth 2 (two) times daily. 540 tablet 3  ? glimepiride (AMARYL) 4 MG tablet Take 4 mg by mouth daily with breakfast.    ? LANTUS SOLOSTAR 100 UNIT/ML Solostar Pen 15 Units at bedtime.    ? levofloxacin (LEVAQUIN) 500 MG tablet Take 500 mg by mouth daily.    ? lisinopril (ZESTRIL) 5 MG tablet Take 5 mg by mouth daily.    ? Loteprednol Etabonate (LOTEMAX SM) 0.38 % GEL Place 1 drop into both eyes 2 (two) times daily.    ? metFORMIN  (GLUCOPHAGE) 1000 MG tablet Take 1,000 mg by mouth 2 (two) times daily with a meal.     ? naproxen sodium (ALEVE) 220 MG tablet Take 440 mg by mouth daily as needed (fever).    ? omeprazole (PRILOSEC) 20 MG capsule Take 20 mg by mouth 2 (two) times daily before a meal.    ? potassium chloride SA (KLOR-CON) 20 MEQ tablet Take 20 mEq by mouth daily.    ? rOPINIRole (REQUIP) 3 MG tablet TAKE 1 TABLET BY MOUTH IN THE MORNING, 1 TABLET IN THE AFTERNOON, AND 1/2 TABLET IN THE EVENING. (Patient taking differently: Take 1.5-3 mg by mouth See admin instructions. TAKE 1 TABLET BY MOUTH IN THE MORNING, 1 TABLET IN THE AFTERNOON, AND 1/2 TABLET IN THE EVENING.) 215 tablet 1  ? Saw Palmetto 500 MG CAPS Take 1 capsule by mouth daily.    ? simvastatin (ZOCOR) 40 MG tablet Take 40 mg by mouth every evening.    ? tamsulosin (FLOMAX) 0.4 MG CAPS capsule Take 0.8 mg by mouth daily.  11  ? triamcinolone (NASACORT) 55 MCG/ACT AERO nasal inhaler Place 2 sprays into the nose daily.    ? Triamcinolone Acetonide (NASACORT ALLERGY 24HR NA) Place 2 sprays into both nostrils daily as needed (allergy).    ? vitamin B-12 (CYANOCOBALAMIN) 1000 MCG tablet Take 1,000 mcg by mouth daily.    ? cephALEXin (KEFLEX) 500 MG capsule Take 1 capsule (500 mg total) by mouth 4 (four) times daily. (Patient not taking: Reported on 12/31/2021) 28 capsule 0  ? GLOBAL EASE INJECT PEN NEEDLES 31G X 5 MM MISC Inject into the skin.    ? rOPINIRole (REQUIP XL) 4 MG 24 hr tablet 1 tablet in the morning, , 1 in the afternoon, half tablet in the evening (Patient not taking: Reported on 12/31/2021) 225 tablet 0  ? rOPINIRole (REQUIP) 3 MG tablet 1 in the AM, 1 in the afternoon, 1/2 tablet in the evening (Patient not taking: Reported on 12/31/2021) 215 tablet 1  ? ? ?Past Medical History:  ?Diagnosis Date  ? Allergic rhinitis   ? Arthritis   ? BPH (benign prostatic hyperplasia)   ? Diabetes ([place])   ? Dysphagia, pharyngeal phase   ? GERD diagnosed on barium swallow. Has small  hiatal hernia. Symptomatically somewhat better on omeprazole but not entirely. We'll try b.i.d. therapy  ? Edema   ? 1+ in both ankles, likely multifactorial including medication such as Requip  ? Erectile dysfunction   ? Staxyn 10 mg or Viagra worked well. 3 samples of Cialis 20 mg provided  ? GERD (gastroesophageal reflux disease)   ? Hypercholesteremia   ? Hypertension   ? Nephrolithiasis   ? Onychomycosis of toenail   ? April 27, 2013 - Dr. Inocencio Homes - podiatry, was in [place] - treating with oral Lamisil and topical nail  therapy  ? Parkinson's disease ([place])   ?  followed by [facility] Lezlie Octave at [facility] and [name], M.D. in [place]  ? Presbycusis   ? and tinnitus - Dr. Izora Gala - August/2013  ? Renal calculus   ? Syncope   ? ? ?Past Surgical History:  ?Procedure Laterality Date  ? HAND SURGERY    ? INGUINAL HERNIA REPAIR Left 11/04/2015  ? Procedure: LEFT INGUINAL HERNIA REPAIR WITH MESH;  Surgeon: Armandina Gemma, MD;  Location: [facility];  Service: General;  Laterality: Left;  ? INSERTION OF MESH Left 11/04/2015  ? Procedure: INSERTION OF MESH;  Surgeon: Armandina Gemma, MD;  Location: [facility];  Service: General;  Laterality: Left;  ? JOINT REPLACEMENT Bilateral   ? KNEE SURGERY    ? TOTAL KNEE ARTHROPLASTY    ? ? ?Social History  ? ?Tobacco Use  ? Smoking status: Never  ? Smokeless tobacco: Never  ?Vaping Use  ? Vaping Use: Never used  ?Substance Use Topics  ? Alcohol use: No  ? Drug use: No  ? ? ?Family History  ?Problem Relation Age of Onset  ? Atrial fibrillation Mother   ? Breast cancer Mother   ? Emphysema Father   ? Heart disease Father   ? Cancer Brother   ?     [place]  ? ? ?PE: ?Vitals:  ? 01/01/22 2100 01/02/22 0515 01/02/22 1127 01/02/22 1234  ?BP: 112/65 (!) 142/80 121/76 (!) 108/55  ?Pulse: 87 62 63 87  ?Resp: '19 17 20 16  '$ ?Temp: 99.1 ?F (37.3 ?C) 98.9 ?F (37.2 ?C) 98.8 ?F (37.1 ?C) 97.7 ?F (36.5 ?C)  ?TempSrc: Oral Oral Oral Oral  ?SpO2: 97% 97% 96% 97%   ?Weight:      ?Height:      ? ?Patient appears to be in no acute distress  ?patient is alert and oriented x3 ?Atraumatic normocephalic head ?No cervical or supraclavicular lymphadenopathy appreciated

## 2022-01-03 ENCOUNTER — Ambulatory Visit: Payer: Medicare Other | Admitting: Neurology

## 2022-01-03 LAB — GLUCOSE, CAPILLARY
Glucose-Capillary: 169 mg/dL — ABNORMAL HIGH (ref 70–99)
Glucose-Capillary: 166 mg/dL — ABNORMAL HIGH (ref 70–99)
Glucose-Capillary: 164 mg/dL — ABNORMAL HIGH (ref 70–99)
Glucose-Capillary: 226 mg/dL — ABNORMAL HIGH (ref 70–99)

## 2022-01-03 NOTE — Progress Notes (Signed)
Occupational Therapy Treatment ?Patient Details ?[name] ?MRN: [id number] ?DOB: [birth year] ?Today's Date: 01/03/2022 ? ? ?History of present illness Patient is a 76 year old male who presented with weakness and fever from home. patient was found to have UTI. PMH: HTN, chronic bilateral lower extremity edema, GERD, HLD, BPH, parkinson's disease ?  ?OT comments ? Shortened session as patient wanting to eat breakfast but agreeable to getting to chair. Patient needing verbal cues for hand placement and light mod A with bed height elevated to power up to standing. Cues to increase step length during transfer as patient taking small shuffled steps. Also cues for safety to reach back for chair when sitting and min A for eccentric control. Updated D/C recommendations as patient/family declining SNF and requesting [facility] services. Acute OT to follow.   ? ?Recommendations for follow up therapy are one component of a multi-disciplinary discharge planning process, led by the attending physician.  Recommendations may be updated based on patient status, additional functional criteria and insurance authorization. ?   ?Follow Up Recommendations ? Home health OT  ?  ?Assistance Recommended at Discharge Frequent or constant Supervision/Assistance  ?Patient can return home with the following ? A lot of help with walking and/or transfers;A lot of help with bathing/dressing/bathroom;Assistance with cooking/housework;Direct supervision/assist for medications management;Direct supervision/assist for financial management;Assist for transportation;Help with stairs or ramp for entrance ?  ?Equipment Recommendations ? None recommended by OT  ?  ?   ?Precautions / Restrictions Precautions ?Precautions: Fall ?Restrictions ?Weight Bearing Restrictions: No  ? ? ?  ? ?Mobility Bed Mobility ?Overal bed mobility: Needs Assistance ?Bed Mobility: Supine to Sit ?  ?  ?Supine to sit: Mod assist, HOB elevated ?  ?  ?General bed mobility comments:  Patient needing light mod A to elevate trunk to sitting ?  ? ? ?  ?Balance Overall balance assessment: Needs assistance ?Sitting-balance support: Feet supported ?Sitting balance-Leahy Scale: Fair ?  ?  ?Standing balance support: Reliant on assistive device for balance, During functional activity ?Standing balance-Leahy Scale: Poor ?  ?  ?  ?  ?  ?  ?  ?  ?  ?  ?  ?  ?   ? ?ADL either performed or assessed with clinical judgement  ? ?ADL Overall ADL's : Needs assistance/impaired ?Eating/Feeding: Independent;Sitting ?  ?  ?  ?  ?  ?  ?  ?  ?  ?  ?  ?Toilet Transfer: Moderate assistance;Stand-pivot;Cueing for sequencing;Cueing for safety;Rolling walker (2 wheels) ?Toilet Transfer Details (indicate cue type and reason): Patient needing verbal cues for hand placement not to pull on walker. Bed height elevated and light mod A to power up to standing. Patient needing min A and verbal cues to increase step length when transferring to recliner. ?  ?  ?  ?  ?Functional mobility during ADLs: Moderate assistance;Rolling walker (2 wheels);Cueing for sequencing;Cueing for safety ?  ?  ? ? ? ?Cognition Arousal/Alertness: Awake/alert ?Behavior During Therapy: [facility] for tasks assessed/performed ?Overall Cognitive Status: Within Functional Limits for tasks assessed ?  ?  ?  ?  ?  ?  ?  ?  ?  ?  ?  ?  ?  ?  ?  ?  ?  ?  ?  ?   ?   ?   ?   ? ? ?Pertinent Vitals/ Pain       Pain Assessment ?Pain Assessment: No/denies pain ? ?   ?   ? ?  Frequency ? Min 2X/week  ? ? ? ? ?  ?Progress Toward Goals ? ?OT Goals(current goals can now be found in the care plan section) ? Progress towards OT goals: Progressing toward goals ? ?Acute Rehab OT Goals ?Patient Stated Goal: Eat breakfast ?OT Goal Formulation: With patient ?Time For Goal Achievement: 01/15/22 ?Potential to Achieve Goals: Good ?ADL Goals ?Pt Will Perform Grooming: with modified independence;standing ?Pt Will Perform Lower Body Dressing: with modified independence;sit to/from  stand;sitting/lateral leans;with adaptive equipment ?Pt Will Transfer to Toilet: with modified independence;ambulating;regular height toilet ?Pt Will Perform Toileting - Clothing Manipulation and hygiene: with modified independence;sitting/lateral leans;sit to/from stand  ?Plan Discharge plan needs to be updated   ? ?   ?AM-PAC OT "6 Clicks" Daily Activity     ?Outcome Measure ? ? Help from another person eating meals?: A Little ?Help from another person taking care of personal grooming?: A Little ?Help from another person toileting, which includes using toliet, bedpan, or urinal?: A Lot ?Help from another person bathing (including washing, rinsing, drying)?: A Lot ?Help from another person to put on and taking off regular upper body clothing?: A Little ?Help from another person to put on and taking off regular lower body clothing?: A Lot ?6 Click Score: 15 ? ?  ?End of Session Equipment Utilized During Treatment: Rolling walker (2 wheels);Gait belt ? ?OT Visit Diagnosis: Unsteadiness on feet (R26.81);Other abnormalities of gait and mobility (R26.89);Muscle weakness (generalized) (M62.81) ?  ?Activity Tolerance Patient tolerated treatment well ?  ?Patient Left in chair;with call bell/phone within reach ?  ?Nurse Communication Mobility status;Other (comment) (notified CNA chair alarm missing batteries) ?  ? ?   ? ?Time: 6720-9470 ?OT Time Calculation (min): 12 min ? ?Charges: OT General Charges ?$OT Visit: 1 Visit ?OT Treatments ?$Self Care/Home Management : 8-22 mins ? ?Delbert Phenix OT ?OT pager: [phone] ? ?Rosemary Holms ?01/03/2022, 10:31 AM ?

## 2022-01-03 NOTE — Progress Notes (Signed)
?  ?  Durable Medical Equipment  ?(From admission, onward)  ?  ? ? ?  ? ?  Start     Ordered  ? 01/03/22 1221  For home use only DME standard manual wheelchair with seat cushion  Once       ?Comments: Patient suffers from weakness which impairs their ability to perform daily activities like dressing and grooming in the home.  A walker will not resolve issue with performing activities of daily living. A wheelchair will allow patient to safely perform daily activities. Patient can safely propel the wheelchair in the home or has a caregiver who can provide assistance. Length of need 6 months . ?Accessories: elevating leg rests (ELRs), wheel locks, extensions and anti-tippers.  ? 01/03/22 1220  ? 01/02/22 1338  For home use only DME Hospital bed  Once       ?Question Answer Comment  ?Length of Need 6 Months   ?The above medical condition requires: Patient requires the ability to reposition frequently   ?Head must be elevated greater than: 30 degrees   ?Bed type Semi-electric   ?  ? 01/02/22 1337  ? ?  ?  ? ?  ?  ?

## 2022-01-03 NOTE — TOC Progression Note (Signed)
Transition of Care (TOC) - Progression Note  ? ? ?Patient Details  ?[name] ?MRN: [id number] ?Date of Birth: October 10, 1946 ? ?Transition of Care (TOC) CM/SW Contact  ?[place], LCSW ?Phone Number: ?01/03/2022, 12:18 PM ? ?Clinical Narrative:    ? ?Spoke with pt spouse and daughter regarding DC plans. They are requesting a wheelchair as the wheelchair they have doesn't fit well through doorways. CSW notified MD; will order wheelchair through [facility]. Pt spouse was waiting to have clarity on DC date prior to arranging for hospital bed dropoff. CSW notified daughter and spouse to go ahead and arrange bed to be delivered.  ? ?Expected Discharge Plan: [place] ?Barriers to Discharge: Continued Medical Work up ? ?Expected Discharge Plan and Services ?Expected Discharge Plan: [place] ?  ?  ?  ?Living arrangements for the past 2 months: [place] ?                ?  ?  ?  ?  ?  ?  ?  ?  ?  ?  ? ? ?Social Determinants of Health (SDOH) Interventions ?  ? ?Readmission Risk Interventions ?   ? View : No data to display.  ?  ?  ?  ? ? ?

## 2022-01-03 NOTE — Consult Note (Signed)
Urology Inpatient Progress Report  UTI (urinary tract infection) [N39.0] Weakness [R53.1] Fever, unspecified fever cause [R50.9]    Intv/Subj: No acute events overnight. Patient is without complaint. PVRs seem to be around 150cc Still on Meropenem, cultures have not resulted yet.  Principal Problem:   UTI (urinary tract infection) Active Problems:   HLD (hyperlipidemia)   Parkinson's disease (HCC)   Diabetes (HCC)   BPH (benign prostatic hyperplasia)   HTN (hypertension)   Generalized weakness   Leukocytosis   Hyponatremia   CKD (chronic kidney disease), stage III (HCC)  Current Facility-Administered Medications  Medication Dose Route Frequency Provider Last Rate Last Admin   acetaminophen (TYLENOL) tablet 650 mg  650 mg Oral Q6H PRN Ronaldo Miyamoto, Tyrone A, DO   650 mg at 01/01/22 2956   Or   acetaminophen (TYLENOL) suppository 650 mg  650 mg Rectal Q6H PRN Ronaldo Miyamoto, Tyrone A, DO       amantadine (SYMMETREL) capsule 100 mg  100 mg Oral TID Ronaldo Miyamoto, Tyrone A, DO   100 mg at 01/03/22 1004   bisacodyl (DULCOLAX) suppository 10 mg  10 mg Rectal Daily PRN Glade Lloyd, MD       carbidopa-levodopa (SINEMET IR) 25-100 MG per tablet immediate release 3 tablet  3 tablet Oral QID Ronaldo Miyamoto, Tyrone A, DO   3 tablet at 01/03/22 1359   enoxaparin (LOVENOX) injection 40 mg  40 mg Subcutaneous Q24H Kyle, Tyrone A, DO   40 mg at 01/02/22 2236   entacapone (COMTAN) tablet 200 mg  200 mg Oral TID Margie Ege A, DO   200 mg at 01/03/22 1400   furosemide (LASIX) tablet 60 mg  60 mg Oral BID Ronaldo Miyamoto, Tyrone A, DO   60 mg at 01/03/22 1004   guaiFENesin-dextromethorphan (ROBITUSSIN DM) 100-10 MG/5ML syrup 5 mL  5 mL Oral Q4H PRN Ronaldo Miyamoto, Tyrone A, DO   5 mL at 01/03/22 0025   insulin aspart (novoLOG) injection 0-5 Units  0-5 Units Subcutaneous QHS Kyle, Tyrone A, DO   2 Units at 01/02/22 2304   insulin aspart (novoLOG) injection 0-9 Units  0-9 Units Subcutaneous TID WC Kyle, Tyrone A, DO   2 Units at 01/03/22 1401    insulin glargine-yfgn (SEMGLEE) injection 15 Units  15 Units Subcutaneous QHS Danford Bad, RPH   15 Units at 01/02/22 2234   lisinopril (ZESTRIL) tablet 5 mg  5 mg Oral Daily Kyle, Tyrone A, DO   5 mg at 01/03/22 1004   Loteprednol Etabonate 0.38 % GEL 1 drop  1 drop Both Eyes BID Ronaldo Miyamoto, Tyrone A, DO   1 drop at 01/01/22 2228   meropenem (MERREM) 1 g in sodium chloride 0.9 % 100 mL IVPB  1 g Intravenous Q8H Tucker, Antonieta Loveless, RPH 200 mL/hr at 01/03/22 1401 1 g at 01/03/22 1401   mirtazapine (REMERON) tablet 7.5 mg  7.5 mg Oral QHS Alekh, Kshitiz, MD   7.5 mg at 01/02/22 2238   polyethylene glycol (MIRALAX / GLYCOLAX) packet 17 g  17 g Oral Daily PRN Glade Lloyd, MD       rOPINIRole (REQUIP) tablet 1.5 mg  1.5 mg Oral QHS Wofford, Drew A, RPH   1.5 mg at 01/02/22 2237   rOPINIRole (REQUIP) tablet 3 mg  3 mg Oral BID Danford Bad, RPH   3 mg at 01/03/22 1359   senna-docusate (Senokot-S) tablet 1 tablet  1 tablet Oral BID Glade Lloyd, MD   1 tablet at 01/03/22 1004   simvastatin (ZOCOR) tablet 40  mg  40 mg Oral QPM Kyle, Tyrone A, DO   40 mg at 01/02/22 1711   tamsulosin (FLOMAX) capsule 0.8 mg  0.8 mg Oral Daily Ronaldo Miyamoto, Tyrone A, DO   0.8 mg at 01/03/22 1004   triamcinolone (NASACORT) nasal inhaler 2 spray  2 spray Nasal Daily Ronaldo Miyamoto, Tyrone A, DO   2 spray at 01/03/22 1005     Objective: Vital: Vitals:   01/02/22 1127 01/02/22 1234 01/03/22 0500 01/03/22 1218  BP: 121/76 (!) 108/55 (!) 139/59 139/72  Pulse: 63 87 (!) 59 64  Resp: 20 16 16  (!) 22  Temp: 98.8 F (37.1 C) 97.7 F (36.5 C) 98.8 F (37.1 C) 97.8 F (36.6 C)  TempSrc: Oral Oral Oral Oral  SpO2: 96% 97% 98% 99%  Weight:   91.9 kg   Height:       I/Os: I/O last 3 completed shifts: In: 1790.7 [P.O.:1160; I.V.:430.7; IV Piggyback:200] Out: 6214 [Urine:6214]  Physical Exam:  General: Patient is in no apparent distress Lungs: Normal respiratory effort, chest expands symmetrically. GI: The abdomen is soft and  nontender without mass. Foley: out  Ext: lower extremities symmetric  Lab Results: Recent Labs    01/01/22 0346 01/02/22 0334  WBC 10.0 7.4  HGB 13.5 13.6  HCT 42.1 42.4   Recent Labs    01/01/22 0346 01/02/22 0334  NA 136 136  K 3.9 3.4*  CL 103 102  CO2 26 27  GLUCOSE 125* 156*  BUN 24* 24*  CREATININE 1.37* 1.18  CALCIUM 8.6* 8.3*   No results for input(s): LABPT, INR in the last 72 hours. No results for input(s): LABURIN in the last 72 hours. Results for orders placed or performed during the hospital encounter of 12/31/21  Resp Panel by RT-PCR (Flu A&B, Covid) Nasopharyngeal Swab     Status: None   Collection Time: 12/31/21  1:29 PM   Specimen: Nasopharyngeal Swab; Nasopharyngeal(NP) swabs in vial transport medium  Result Value Ref Range Status   SARS Coronavirus 2 by RT PCR NEGATIVE NEGATIVE Final    Comment: (NOTE) SARS-CoV-2 target nucleic acids are NOT DETECTED.  The SARS-CoV-2 RNA is generally detectable in upper respiratory specimens during the acute phase of infection. The lowest concentration of SARS-CoV-2 viral copies this assay can detect is 138 copies/mL. A negative result does not preclude SARS-Cov-2 infection and should not be used as the sole basis for treatment or other patient management decisions. A negative result may occur with  improper specimen collection/handling, submission of specimen other than nasopharyngeal swab, presence of viral mutation(s) within the areas targeted by this assay, and inadequate number of viral copies(<138 copies/mL). A negative result must be combined with clinical observations, patient history, and epidemiological information. The expected result is Negative.  Fact Sheet for Patients:  BloggerCourse.com  Fact Sheet for Healthcare Providers:  SeriousBroker.it  This test is no t yet approved or cleared by the [place] FDA and  has been authorized for  detection and/or diagnosis of SARS-CoV-2 by FDA under an Emergency Use Authorization (EUA). This EUA will remain  in effect (meaning this test can be used) for the duration of the COVID-19 declaration under Section 564(b)(1) of the Act, 21 U.S.C.section 360bbb-3(b)(1), unless the authorization is terminated  or revoked sooner.       Influenza A by PCR NEGATIVE NEGATIVE Final   Influenza B by PCR NEGATIVE NEGATIVE Final    Comment: (NOTE) The Xpert Xpress SARS-CoV-2/FLU/RSV plus assay is intended as an aid in  the diagnosis of influenza from Nasopharyngeal swab specimens and should not be used as a sole basis for treatment. Nasal washings and aspirates are unacceptable for Xpert Xpress SARS-CoV-2/FLU/RSV testing.  Fact Sheet for Patients: BloggerCourse.com  Fact Sheet for Healthcare Providers: SeriousBroker.it  This test is not yet approved or cleared by the [place] FDA and has been authorized for detection and/or diagnosis of SARS-CoV-2 by FDA under an Emergency Use Authorization (EUA). This EUA will remain in effect (meaning this test can be used) for the duration of the COVID-19 declaration under Section 564(b)(1) of the Act, 21 U.S.C. section 360bbb-3(b)(1), unless the authorization is terminated or revoked.  Performed at [facility], [address], [place], [place] [address]   Culture, blood (routine x 2)     Status: None (Preliminary result)   Collection Time: 12/31/21  1:34 PM   Specimen: BLOOD  Result Value Ref Range Status   Specimen Description   Final    BLOOD RIGHT ANTECUBITAL Performed at [facility], [address], [place], [place] [address]    Special Requests   Final    BOTTLES DRAWN AEROBIC AND ANAEROBIC Blood Culture results may not be optimal due to an inadequate volume of blood received in culture bottles Performed at [facility], [address] [place], [place] [address]    Culture   Final    NO GROWTH 3 DAYS Performed at [facility], [address] [place], [place] [address]    Report Status PENDING  Incomplete  Culture, blood (routine x 2)     Status: None (Preliminary result)   Collection Time: 12/31/21  1:40 PM   Specimen: BLOOD  Result Value Ref Range Status   Specimen Description   Final    BLOOD LEFT FOREARM Performed at [facility], [address] [place], [place] [address]    Special Requests   Final    BOTTLES DRAWN AEROBIC AND ANAEROBIC Blood Culture results may not be optimal due to an inadequate volume of blood received in culture bottles Performed at [facility], [address] [place], [place] [address]    Culture   Final    NO GROWTH 3 DAYS Performed at [facility], [address] [place], [place] [address]    Report Status PENDING  Incomplete  Urine Culture     Status: Abnormal (Preliminary result)   Collection Time: 12/31/21  2:43 PM   Specimen: Urine, Clean Catch  Result Value Ref Range Status   Specimen Description   Final    URINE, CLEAN CATCH Performed at [facility], [address] [place], [place] [address]    Special Requests   Final    NONE Performed at [facility], [address], [place], [place] [address]    Culture (A)  Final    20,000 COLONIES/mL GRAM NEGATIVE RODS IDENTIFICATION AND SUSCEPTIBILITIES TO FOLLOW repeating Performed at [facility], [address] [place], [place] [address]    Report Status PENDING  Incomplete    Studies/Results: No results found.  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  I would recommend that he be treated with abx for 2 weeks to ensure that his infection is cleared.  The culture results have been  very slow to result.Marland KitchenMarland Kitchen  I called to the lab to have them look at it early in the morning since all the lab techs had left for the day.    Berniece Salines, MD Urology 01/03/2022, 4:52 PM

## 2022-01-03 NOTE — Progress Notes (Signed)
?PROGRESS NOTE ? ? ? ?[name]  IZT:[id number] DOB: February 06, 1946 DOA: 12/31/2021 ?PCP: Josetta Huddle, MD  ? ?Brief Narrative:  ?76 y.o. male with medical history significant of HTN, chronic bilateral lower extremity edema, GERD, HLD, BPH, parkinson's disease presented with weakness and fevers at home.  On presentation, UA was positive for leukocyte esterase and nitrites.  Chest x-ray was negative for consolidation.  He was started on IV fluids and antibiotics. ? ?Assessment & Plan: ?  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  Currently on meropenem. ?-Blood cultures negative so far.  COVID-19/influenza negative on presentation.  Urine culture growing gram-negative rods; follow identification and sensitivities ?-Urology evaluation appreciated: Recommended that the patient get serial bladder scans after he has voided to ensure that his bladder volumes are not too high.  If postvoid residual volumes are consistently elevated, patient would need possible intermittent in and out cath.  Outpatient follow-up with urology. ? ?Generalized weakness ?-Possibly from above.  PT recommended SNF placement.  Patient/family hesitant at this time and interested in home health ? ?Parkinson's disease ?-Continue Sinemet, amantadine and entacapone.  Outpatient follow-up with neurology ? ?Hypertension ?-Continue lisinopril and Lasix.  Monitor blood pressure ? ?Hyperlipidemia ?Continue simvastatin ? ?BPH ?-Continue tamsulosin ? ?Diabetes mellitus type 2 ?-A1c 6.4.  Continue Lantus along with CBGs with SSI. ? ?Hyponatremia ?-Resolved ? ?Hypokalemia ?-Replaced.  No labs today. ? ?Leukocytosis ?-Resolved ? ?Chronic kidney disease stage IIIa ?-Creatinine stable.  Monitor ? ? ?DVT prophylaxis: Lovenox ?Code Status: DNR ?Family Communication: Wife at bedside ?Disposition Plan: ?Status is: Inpatient ?Remains inpatient appropriate because: Of need for IV  antibiotics.  History of MDR UTI in the past.  Follow cultures.   ? ?Consultants: None ? ?Procedures: None ? ?Antimicrobials: Meropenem from 12/31/2021 onwards ? ? ?Subjective: ?Patient seen and examined at bedside.  Poor historian.  Wife at bedside.  No fever, vomiting, chest pain reported.  Still feels weak but appetite is improving. ?Objective: ?Vitals:  ? 01/02/22 0515 01/02/22 1127 01/02/22 1234 01/03/22 0500  ?BP: (!) 142/80 121/76 (!) 108/55 (!) 139/59  ?Pulse: 62 63 87 (!) 59  ?Resp: '17 20 16 16  '$ ?Temp: 98.9 ?F (37.2 ?C) 98.8 ?F (37.1 ?C) 97.7 ?F (36.5 ?C) 98.8 ?F (37.1 ?C)  ?TempSrc: Oral Oral Oral Oral  ?SpO2: 97% 96% 97% 98%  ?Weight:    91.9 kg  ?Height:      ? ? ?Intake/Output Summary (Last 24 hours) at 01/03/2022 0719 ?Last data filed at 01/03/2022 [phone] ?Gross per 24 hour  ?Intake 920 ml  ?Output 4814 ml  ?Net -3894 ml  ? ? ?Filed Weights  ? 12/31/21 1153 01/03/22 0500  ?Weight: 87.5 kg 91.9 kg  ? ? ?Examination: ? ?General exam: No acute distress.  On room air currently.  Awake, slow to respond, flat affect, very poor historian.  Looks chronically ill and deconditioned. ?Respiratory system: Bilateral decreased breath sounds at bases  ?cardiovascular system: S1-S2 heard; intermittently bradycardic  ?gastrointestinal system: Abdomen is mildly distended; soft and nontender.  Normal bowel sounds heard  ?extremities: No cyanosis; trace lower extremity edema present ? ?Data Reviewed: I have personally reviewed following labs and imaging studies ? ?CBC: ?Recent Labs  ?Lab 12/31/21 ?1330 01/01/22 ?0539 01/02/22 ?7673  ?WBC 14.3* 10.0 7.4  ?NEUTROABS 12.8*  --  5.9  ?HGB 14.8 13.5 13.6  ?HCT 45.9 42.1 42.4  ?MCV 91.6 91.7 90.6  ?PLT 193  181 201  ? ? ?Basic Metabolic Panel: ?Recent Labs  ?Lab 12/31/21 ?1330 01/01/22 ?8003 01/02/22 ?4917  ?NA 132* 136 136  ?K 3.8 3.9 3.4*  ?CL 97* 103 102  ?CO2 '26 26 27  '$ ?GLUCOSE 146* 125* 156*  ?BUN 24* 24* 24*  ?CREATININE 1.44* 1.37* 1.18  ?CALCIUM 9.3 8.6* 8.3*  ?MG  --   --   2.0  ? ? ?GFR: ?Estimated Creatinine Clearance: 59.4 mL/min (by C-G formula based on SCr of 1.18 mg/dL). ?Liver Function Tests: ?Recent Labs  ?Lab 12/31/21 ?1330 01/01/22 ?9150  ?AST 22 37  ?ALT 9 <5  ?ALKPHOS 102 89  ?BILITOT 0.6 0.5  ?PROT 7.5 5.9*  ?ALBUMIN 3.5 2.7*  ? ? ?No results for input(s): LIPASE, AMYLASE in the last 168 hours. ?No results for input(s): AMMONIA in the last 168 hours. ?Coagulation Profile: ?No results for input(s): INR, PROTIME in the last 168 hours. ?Cardiac Enzymes: ?No results for input(s): CKTOTAL, CKMB, CKMBINDEX, TROPONINI in the last 168 hours. ?BNP (last 3 results) ?No results for input(s): PROBNP in the last 8760 hours. ?HbA1C: ?Recent Labs  ?  01/01/22 ?5697  ?HGBA1C 6.4*  ? ? ?CBG: ?Recent Labs  ?Lab 01/01/22 ?2147 01/02/22 ?0739 01/02/22 ?1125 01/02/22 ?1618 01/02/22 ?2259  ?GLUCAP 172* 173* 197* 186* 234*  ? ? ?Lipid Profile: ?No results for input(s): CHOL, HDL, LDLCALC, TRIG, CHOLHDL, LDLDIRECT in the last 72 hours. ?Thyroid Function Tests: ?No results for input(s): TSH, T4TOTAL, FREET4, T3FREE, THYROIDAB in the last 72 hours. ?Anemia Panel: ?No results for input(s): VITAMINB12, FOLATE, FERRITIN, TIBC, IRON, RETICCTPCT in the last 72 hours. ?Sepsis Labs: ?Recent Labs  ?Lab 12/31/21 ?1339  ?LATICACIDVEN 1.4  ? ? ? ?Recent Results (from the past 240 hour(s))  ?Resp Panel by RT-PCR (Flu A&B, Covid) Nasopharyngeal Swab     Status: None  ? Collection Time: 12/31/21  1:29 PM  ? Specimen: Nasopharyngeal Swab; Nasopharyngeal(NP) swabs in vial transport medium  ?Result Value Ref Range Status  ? SARS Coronavirus 2 by RT PCR NEGATIVE NEGATIVE Final  ?  Comment: (NOTE) ?SARS-CoV-2 target nucleic acids are NOT DETECTED. ? ?The SARS-CoV-2 RNA is generally detectable in upper respiratory ?specimens during the acute phase of infection. The lowest ?concentration of SARS-CoV-2 viral copies this assay can detect is ?138 copies/mL. A negative result does not preclude SARS-Cov-2 ?infection and  should not be used as the sole basis for treatment or ?other patient management decisions. A negative result may occur with  ?improper specimen collection/handling, submission of specimen other ?than nasopharyngeal swab, presence of viral mutation(s) within the ?areas targeted by this assay, and inadequate number of viral ?copies(<138 copies/mL). A negative result must be combined with ?clinical observations, patient history, and epidemiological ?information. The expected result is Negative. ? ?Fact Sheet for Patients:  ?EntrepreneurPulse.com.au ? ?Fact Sheet for Healthcare Providers:  ?IncredibleEmployment.be ? ?This test is no t yet approved or cleared by the [place] FDA and  ?has been authorized for detection and/or diagnosis of SARS-CoV-2 by ?FDA under an Emergency Use Authorization (EUA). This EUA will remain  ?in effect (meaning this test can be used) for the duration of the ?COVID-19 declaration under Section 564(b)(1) of the Act, 21 ?U.S.C.section 360bbb-3(b)(1), unless the authorization is terminated  ?or revoked sooner.  ? ? ?  ? Influenza A by PCR NEGATIVE NEGATIVE Final  ? Influenza B by PCR NEGATIVE NEGATIVE Final  ?  Comment: (NOTE) ?The Xpert Xpress SARS-CoV-2/FLU/RSV plus assay is intended as an aid ?in the  diagnosis of influenza from Nasopharyngeal swab specimens and ?should not be used as a sole basis for treatment. Nasal washings and ?aspirates are unacceptable for Xpert Xpress SARS-CoV-2/FLU/RSV ?testing. ? ?Fact Sheet for Patients: ?EntrepreneurPulse.com.au ? ?Fact Sheet for Healthcare Providers: ?IncredibleEmployment.be ? ?This test is not yet approved or cleared by the [place] FDA and ?has been authorized for detection and/or diagnosis of SARS-CoV-2 by ?FDA under an Emergency Use Authorization (EUA). This EUA will remain ?in effect (meaning this test can be used) for the duration of the ?COVID-19 declaration  under Section 564(b)(1) of the Act, 21 U.S.C. ?section 360bbb-3(b)(1), unless the authorization is terminated or ?revoked. ? ?Performed at [facility], [address] ?[place], [name]

## 2022-01-03 NOTE — Progress Notes (Signed)
Pharmacy Antibiotic Note ? ?[name] is a 76 y.o. male admitted on 12/31/2021 with UTI.  Pharmacy has been consulted for meropenem dosing based on previous hx of ESBL kleb pneumo and citrobacter UTI from September 2022.  ? ?Plan: ?Continue meropenem 1g IV q8 hours ?F/u urine culture speciation and sensitivities ? ?Height: 6' (182.9 cm) ?Weight: 91.9 kg (202 lb 9.6 oz) ?IBW/kg (Calculated) : 77.6 ? ?Temp (24hrs), Avg:98.4 ?F (36.9 ?C), Min:97.7 ?F (36.5 ?C), Max:98.8 ?F (37.1 ?C) ? ?Recent Labs  ?Lab 12/31/21 ?1330 12/31/21 ?1339 01/01/22 ?7510 01/02/22 ?2585  ?WBC 14.3*  --  10.0 7.4  ?CREATININE 1.44*  --  1.37* 1.18  ?LATICACIDVEN  --  1.4  --   --   ? ?  ?Estimated Creatinine Clearance: 59.4 mL/min (by C-G formula based on SCr of 1.18 mg/dL).   ? ?Allergies  ?Allergen Reactions  ? Oxycodone Itching  ?  This makes him itch  ? Sudafed [Pseudoephedrine Hcl]   ?  Problems urinating   ? ?Antimicrobials this admission:  ?Meropenem 3/19 >>  ? ?Dose adjustments this admission:  ?3/20: Meropenem 1g IV q12h >> 1g IV q8h for CrCl > 50  ? ?Microbiology results:  ?3/19 BCx: ngtd ?3/19 UCx: 20K GNR  ?3/19 COVID/Influenza negative ? ?Previous Urine Culture:  ?07/09/21: Citrobacter freundii (R-Ancef); ESBL Klebsiella pneumoniae ? ? ?Thank you for allowing pharmacy to be a part of this patient?s care. ? ?Dimple Nanas, PharmD ?01/03/2022 9:04 AM ? ?

## 2022-01-03 NOTE — Progress Notes (Signed)
Physical Therapy Treatment ?Patient Details ?[name] ?MRN: [id number] ?DOB: 01-Jul-1946 ?Today's Date: 01/03/2022 ? ? ?History of Present Illness Patient is a 76 year old male who presented with weakness and fever from home. patient was found to have UTI. PMH: HTN, chronic bilateral lower extremity edema, GERD, HLD, BPH, parkinson's disease ? ?  ?PT Comments  ? ? Patient making excellent progress with mobility and now requires min assist for transfers and gait with RW. Patient ambulated ~220' with Min assist to prevent posterior LOB and to steady due to freezing episodes and shuffled steps. Time spent during and at [place] pt/family on community resources for exercise specific to people with PD as well as OPPT for neuro rehab. He will benefit from HHPT initially to bridge mobility from acute to outpatient setting. Acute PT will continue to progress as able. ? ?  ?Recommendations for follow up therapy are one component of a multi-disciplinary discharge planning process, led by the attending physician.  Recommendations may be updated based on patient status, additional functional criteria and insurance authorization. ? ?Follow Up Recommendations ? Home health PT (transition to OPPT neuro for parkinson's) ?  ?  ?Assistance Recommended at Discharge Frequent or constant Supervision/Assistance  ?Patient can return home with the following A little help with walking and/or transfers;A lot of help with bathing/dressing/bathroom;Assistance with cooking/housework;Assist for transportation;Help with stairs or ramp for entrance ?  ?Equipment Recommendations ? None recommended by PT  ?  ?Recommendations for Other Services   ? ? ?  ?Precautions / Restrictions Precautions ?Precautions: Fall ?Restrictions ?Weight Bearing Restrictions: No  ?  ? ?Mobility ? Bed Mobility ?  ?  ?  ?  ?  ?  ?  ?General bed mobility comments: pt OOB in recliner ?  ? ?Transfers ?Overall transfer level: Needs assistance ?Equipment used: Rolling  walker (2 wheels) ?Transfers: Sit to/from Stand ?Sit to Stand: Min assist ?  ?  ?  ?  ?  ?General transfer comment: cues to use bil UE's on armrests and to transition bil UE to armrests. min assist to complete rise and shift weight anterior in standing. ?  ? ?Ambulation/Gait ?Ambulation/Gait assistance: Min assist, +2 safety/equipment (chair follow for safety) ?Gait Distance (Feet): 220 Feet ?Assistive device: Rolling walker (2 wheels) ?Gait Pattern/deviations: Festinating, Trunk flexed, Decreased stride length, Decreased step length - left, Decreased step length - right, Narrow base of support ?Gait velocity: decr ?  ?  ?General Gait Details: pt with festinating steps and freezing episodes at almost all transitions during gait. Assist to steady walker and prevent walker from moving too far ahead of pt. visual cues to increase step length were effective at pt re-starting gait after freezing episodes. pt denied fatigue. ? ? ?Stairs ?  ?  ?  ?  ?  ? ? ?Wheelchair Mobility ?  ? ?Modified Rankin (Stroke Patients Only) ?  ? ? ?  ?Balance Overall balance assessment: Needs assistance ?Sitting-balance support: Feet supported ?Sitting balance-Leahy Scale: Fair ?  ?  ?Standing balance support: Reliant on assistive device for balance, During functional activity ?Standing balance-Leahy Scale: Poor ?  ?  ?  ?  ?  ?  ?  ?  ?  ?  ?  ?  ?  ? ?  ?Cognition Arousal/Alertness: Awake/alert ?Behavior During Therapy: [facility] for tasks assessed/performed ?Overall Cognitive Status: Within Functional Limits for tasks assessed ?  ?  ?  ?  ?  ?  ?  ?  ?  ?  ?  ?  ?  ?  ?  ?  ?  ?  ?  ? ?  ?  Exercises   ? ?  ?General Comments   ?  ?  ? ?Pertinent Vitals/Pain Pain Assessment ?Pain Assessment: No/denies pain  ? ? ?Home Living   ?  ?  ?  ?  ?  ?  ?  ?  ?  ?   ?  ?Prior Function    ?  ?  ?   ? ?PT Goals (current goals can now be found in the care plan section) Acute Rehab PT Goals ?Patient Stated Goal: go home ?PT Goal Formulation: With  patient/family ?Time For Goal Achievement: 01/15/22 ?Potential to Achieve Goals: Fair ?Progress towards PT goals: Progressing toward goals ? ?  ?Frequency ? ? ? Min 3X/week ? ? ? ?  ?PT Plan Current plan remains appropriate;Frequency needs to be updated  ? ? ?Co-evaluation   ?  ?  ?  ?  ? ?  ?AM-PAC PT "6 Clicks" Mobility   ?Outcome Measure ? Help needed turning from your back to your side while in a flat bed without using bedrails?: A Lot ?Help needed moving from lying on your back to sitting on the side of a flat bed without using bedrails?: A Lot ?Help needed moving to and from a bed to a chair (including a wheelchair)?: A Little ?Help needed standing up from a chair using your arms (e.g., wheelchair or bedside chair)?: A Little ?Help needed to walk in hospital room?: A Little ?Help needed climbing 3-5 steps with a railing? : A Lot ?6 Click Score: 15 ? ?  ?End of Session Equipment Utilized During Treatment: Gait belt ?Activity Tolerance: Patient tolerated treatment well ?Patient left: in chair;with call bell/phone within reach;with family/visitor present (no batteries in chair alarm) ?Nurse Communication: Mobility status ?PT Visit Diagnosis: Unsteadiness on feet (R26.81);Other abnormalities of gait and mobility (R26.89);Other symptoms and signs involving the nervous system (R29.898);Difficulty in walking, not elsewhere classified (R26.2) ?  ? ? ?Time: 2637-8588 ?PT Time Calculation (min) (ACUTE ONLY): 39 min ? ?Charges:  $Gait Training: 23-37 mins          ?          ? ?Gwynneth Albright PT, DPT ?Acute Rehabilitation Services ?Office [phone] ?Pager [phone]  ? ? ?Jacques Navy ?01/03/2022, 4:10 PM ? ?

## 2022-01-04 ENCOUNTER — Inpatient Hospital Stay: Payer: Self-pay

## 2022-01-04 DIAGNOSIS — D72829 Elevated white blood cell count, unspecified: Secondary | ICD-10-CM

## 2022-01-04 LAB — URINE CULTURE: Culture: 20000 — AB

## 2022-01-04 LAB — GLUCOSE, CAPILLARY
Glucose-Capillary: 193 mg/dL — ABNORMAL HIGH (ref 70–99)
Glucose-Capillary: 171 mg/dL — ABNORMAL HIGH (ref 70–99)
Glucose-Capillary: 202 mg/dL — ABNORMAL HIGH (ref 70–99)
Glucose-Capillary: 266 mg/dL — ABNORMAL HIGH (ref 70–99)

## 2022-01-04 MED ORDER — SODIUM CHLORIDE 0.9% FLUSH
10.0000 mL | Freq: Two times a day (BID) | INTRAVENOUS | Status: DC
  Administered 2022-01-05: 10 mL

## 2022-01-04 MED ORDER — CHLORHEXIDINE GLUCONATE CLOTH 2 % EX PADS
6.0000 | MEDICATED_PAD | Freq: Every day | CUTANEOUS | Status: DC
  Administered 2022-01-05: 6 via TOPICAL

## 2022-01-04 MED ORDER — SODIUM CHLORIDE 0.9% FLUSH: 10.0000 mL | INTRAVENOUS | Status: DC | PRN

## 2022-01-04 NOTE — Progress Notes (Signed)
PHARMACY CONSULT NOTE FOR: ? ?OUTPATIENT  PARENTERAL ANTIBIOTIC THERAPY (OPAT) ? ?Indication: ESBL Klebsiella UTI  ?Regimen: Ertapenem (Invanz) 1g IV daily  ?End date: 01/13/22  ? ?IV antibiotic discharge orders are pended. ?To discharging provider:  please sign these orders via discharge navigator,  ?Select New Orders & click on the button choice - Manage This Unsigned Work.  ?  ? ?Thank you for allowing pharmacy to be a part of this patient's care. ? ?Gretta Arab PharmD, BCPS ?Clinical Pharmacist ?[facility] [phone] ?01/04/2022 11:24 AM ? ?

## 2022-01-04 NOTE — Progress Notes (Signed)
Peripherally Inserted Central Catheter Placement ? ?The IV Nurse has discussed with the patient and/or persons authorized to consent for the patient, the purpose of this procedure and the potential benefits and risks involved with this procedure.  The benefits include less needle sticks, lab draws from the catheter, and the patient may be discharged home with the catheter. Risks include, but not limited to, infection, bleeding, blood clot (thrombus formation), and puncture of an artery; nerve damage and irregular heartbeat and possibility to perform a PICC exchange if needed/ordered by physician.  Alternatives to this procedure were also discussed.  Bard Power PICC patient education guide, fact sheet on infection prevention and patient information card has been provided to patient /or left at bedside.   ? ?PICC Placement Documentation  ?PICC Single Lumen 22/48/25 Left Basilic 48 cm 1 cm (Active)  ?Indication for Insertion or Continuance of Line Home intravenous therapies (PICC only) 01/04/22 2110  ?Exposed Catheter (cm) 1 cm 01/04/22 2110  ?Site Assessment Clean, Dry, Intact 01/04/22 2110  ?Line Status Flushed;Saline locked;Blood return noted 01/04/22 2110  ?Dressing Type Securing device;Transparent 01/04/22 2110  ?Dressing Status Antimicrobial disc in place;Clean, Dry, Intact 01/04/22 2110  ?Dressing Change Due 01/11/22 01/04/22 2110  ? ? ? ? ? ?Shon Hale ?01/04/2022, 9:18 PM ? ?

## 2022-01-04 NOTE — TOC Progression Note (Signed)
Transition of Care (TOC) - Progression Note  ? ? ?Patient Details  ?[name] ?MRN: [id number] ?Date of Birth: 30-Oct-1945 ? ?Transition of Care (TOC) CM/SW Contact  ?[place], LCSW ?Phone Number: ?01/04/2022, 10:48 AM ? ?Clinical Narrative:    ? ?Pt will need IV Abx at DC. Alvis Lemmings will add an RN to [facility] services. Referral made to [facility] with [facility] for IV abx. Family will need a shower bench as well; CSW ordered with adapt.  ? ?Expected Discharge Plan: [place] ?Barriers to Discharge: Continued Medical Work up ? ?Expected Discharge Plan and Services ?Expected Discharge Plan: [place] ?  ?  ?  ?Living arrangements for the past 2 months: [place] ?                ?  ?  ?  ?  ?  ?  ?  ?  ?  ?  ? ? ?Social Determinants of Health (SDOH) Interventions ?  ? ?Readmission Risk Interventions ?   ? View : No data to display.  ?  ?  ?  ? ? ?

## 2022-01-04 NOTE — Progress Notes (Signed)
Physical Therapy Treatment ?Patient Details ?[name] ?MRN: [id number] ?DOB: Dec 27, 1945 ?Today's Date: 01/04/2022 ? ? ?History of Present Illness Patient is a 76 year old male who presented with weakness and fever from home. patient was found to have UTI. PMH: HTN, chronic bilateral lower extremity edema, GERD, HLD, BPH, parkinson's disease. ? ?  ?PT Comments  ? ? Focus of session today was functional mobility, transfers, movement priming, and gait training. The patient tolerated well.  Pt. Shows overall improvement with his festination, balance, and continuation through a gait cycle. Multimodal cuing was used to maintain stride length, step pattern, and timing. Functional strength, balance, movement timing, festination, and functional gait are still limiting function. Pt. Would benefit from skilled PT to continue to address his transfer, gait, and balance ability. Plan and discharge setting remains unchanged. Pt to follow acutely as appropriate.  ?   ?Recommendations for follow up therapy are one component of a multi-disciplinary discharge planning process, led by the attending physician.  Recommendations may be updated based on patient status, additional functional criteria and insurance authorization. ? ?Follow Up Recommendations ? Home health PT (transition to OPPT neuro) ?  ?  ?Assistance Recommended at Discharge Frequent or constant Supervision/Assistance  ?Patient can return home with the following A little help with walking and/or transfers;A lot of help with bathing/dressing/bathroom;Assistance with cooking/housework;Assist for transportation;Help with stairs or ramp for entrance ?  ?Equipment Recommendations ? None recommended by PT  ?  ?Recommendations for Other Services   ? ? ?  ?Precautions / Restrictions Precautions ?Precautions: Fall ?Restrictions ?Weight Bearing Restrictions: No  ?  ? ?Mobility ? Bed Mobility ?Overal bed mobility: Needs Assistance ?Bed Mobility: Supine to Sit ?  ?  ?Supine to  sit: Min assist ?  ?  ?General bed mobility comments: Pt. requires significant increased time for bed mobility and min A for hip shift forward ?  ? ?Transfers ?Overall transfer level: Needs assistance ?Equipment used: Straight cane ?Transfers: Sit to/from Stand ?Sit to Stand: Min assist ?  ?  ?  ?  ?  ?General transfer comment: Cues for weight shift forward and min A for power up. Pt. attempts to stand straight up and not knees over toes causing a slight posterior lean. ?  ? ?Ambulation/Gait ?Ambulation/Gait assistance: Min assist ?Gait Distance (Feet): 200 Feet ?Assistive device: Rollator (4 wheels) ?Gait Pattern/deviations: Festinating, Trunk flexed, Decreased stride length, Decreased step length - left, Decreased step length - right, Narrow base of support, Shuffle ?Gait velocity: decr ?  ?  ?General Gait Details: Pt. requires increased assistance from [facility] and swithced to using a rollator. He presents with festinating gait and shuffle steps at time. He responds well to auditory cues of counting or stating "left, right". He festinates around corners, near obstacles, or when the environment becomes complex. He was instructed to stop, reset the rollator, then count off and take a step. He required min A for rollator management as well. ? ? ?Stairs ?  ?  ?  ?  ?  ? ? ?Wheelchair Mobility ?  ? ?Modified Rankin (Stroke Patients Only) ?  ? ? ?  ?Balance Overall balance assessment: Needs assistance ?Sitting-balance support: Feet supported ?Sitting balance-Leahy Scale: Fair ?Sitting balance - Comments: Able to doff/donn socks with increased time ?  ?Standing balance support: Reliant on assistive device for balance, During functional activity ?Standing balance-Leahy Scale: Poor ?Standing balance comment: required AD and assitance with managing it ?  ?  ?  ?  ?  ?  ?  ?  ?  ?  ?  ?  ? ?  ?  Cognition Arousal/Alertness: Awake/alert ?Behavior During Therapy: [facility] for tasks assessed/performed ?Overall Cognitive Status: Within  Functional Limits for tasks assessed ?  ?  ?  ?  ?  ?  ?  ?  ?  ?  ?  ?  ?  ?  ?  ?  ?General Comments: Education on parkinson's exercise and support programs in the area, handout administered. ?  ?  ? ?  ?Exercises Other Exercises ?Other Exercises: Power seated twists with "boomwhakers" for sound. Pt. cued to take large steps and strong trunk twists to produce a loud sound. ? ?  ?General Comments   ?  ?  ? ?Pertinent Vitals/Pain Pain Assessment ?Pain Assessment: No/denies pain  ? ? ?Home Living   ?  ?  ?  ?  ?  ?  ?  ?  ?  ?   ?  ?Prior Function    ?  ?  ?   ? ?PT Goals (current goals can now be found in the care plan section) Acute Rehab PT Goals ?Patient Stated Goal: go home ?PT Goal Formulation: With patient/family ?Time For Goal Achievement: 01/15/22 ?Potential to Achieve Goals: Fair ?Progress towards PT goals: Progressing toward goals ? ?  ?Frequency ? ? ? Min 3X/week ? ? ? ?  ?PT Plan Current plan remains appropriate;Frequency needs to be updated  ? ? ?Co-evaluation   ?  ?  ?  ?  ? ?  ?AM-PAC PT "6 Clicks" Mobility   ?Outcome Measure ? Help needed turning from your back to your side while in a flat bed without using bedrails?: A Little ?Help needed moving from lying on your back to sitting on the side of a flat bed without using bedrails?: A Little ?Help needed moving to and from a bed to a chair (including a wheelchair)?: A Little ?Help needed standing up from a chair using your arms (e.g., wheelchair or bedside chair)?: A Little ?Help needed to walk in hospital room?: A Little ?Help needed climbing 3-5 steps with a railing? : A Lot ?6 Click Score: 17 ? ?  ?End of Session Equipment Utilized During Treatment: Gait belt ?Activity Tolerance: Patient tolerated treatment well ?Patient left: in chair;with call bell/phone within reach;with family/visitor present ?Nurse Communication: Mobility status ?PT Visit Diagnosis: Unsteadiness on feet (R26.81);Other abnormalities of gait and mobility (R26.89);Other symptoms  and signs involving the nervous system (R29.898);Difficulty in walking, not elsewhere classified (R26.2) ?  ? ? ?Time: 1751-0258 ?PT Time Calculation (min) (ACUTE ONLY): 38 min ? ?Charges:  $Gait Training: 8-22 mins ?$Neuromuscular Re-education: 8-22 mins          ?          ? ?Thermon Leyland, SPT ?Acute Rehab Services ? ? ? ?Thermon Leyland ?01/04/2022, 4:22 PM ? ?

## 2022-01-04 NOTE — Progress Notes (Signed)
?PROGRESS NOTE ? ? ? ?[name]  GMW:[id number] DOB: [birth year] DOA: 12/31/2021 ?PCP: Josetta Huddle, MD  ? ?Brief Narrative:  ?76 y.o. male with medical history significant of HTN, chronic bilateral lower extremity edema, GERD, HLD, BPH, parkinson's disease presented with weakness and fevers at home.  On presentation, UA was positive for leukocyte esterase and nitrites.  Chest x-ray was negative for consolidation.  He was started on IV fluids and antibiotics. ? ?Assessment & Plan: ?  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  Currently on meropenem. ?-Blood cultures negative so far.  COVID-19/influenza negative on presentation.  Urine culture ESBL Klebsiella. ?-Urology following: Now recommending total of 2 weeks of antibiotics.  Outpatient follow-up with urology. ?-Place PICC line today.  Will discharge home tomorrow on IV ertapenem once a day. ? ?Generalized weakness ?-Possibly from above.  PT recommended SNF placement.  Patient/family hesitant at this time and interested in home health ? ?Parkinson's disease ?-Continue Sinemet, amantadine and entacapone.  Outpatient follow-up with neurology ? ?Hypertension ?-Continue lisinopril and Lasix.  Monitor blood pressure ? ?Hyperlipidemia ?Continue simvastatin ? ?BPH ?-Continue tamsulosin ? ?Diabetes mellitus type 2 ?-A1c 6.4.  Continue Lantus along with CBGs with SSI. ? ?Hyponatremia ?-Resolved ? ?Hypokalemia ?-Replaced.  No labs today. ? ?Leukocytosis ?-Resolved ? ?Chronic kidney disease stage IIIa ?-Creatinine stable.  Monitor ? ? ?DVT prophylaxis: Lovenox ?Code Status: DNR ?Family Communication: Wife and daughter at bedside ?Disposition Plan: ?Status is: Inpatient ?Remains inpatient appropriate because: Of need for IV antibiotics.  History of MDR UTI in the past.  ? ?Consultants: None ? ?Procedures: None ? ?Antimicrobials: Meropenem from 12/31/2021  onwards ? ? ?Subjective: ?Patient seen and examined at bedside.  Poor historian.  Wife at bedside.  No worsening shortness of breath, chest pain, vomiting or abdominal pain reported.  Still feels weak but appetite and strength improving. ? Objective: ?Vitals:  ? 01/03/22 0500 01/03/22 1218 01/03/22 2159 01/04/22 0336  ?BP: (!) 139/59 139/72 133/67 140/69  ?Pulse: (!) 59 64 61 (!) 58  ?Resp: 16 (!) '22 20 18  '$ ?Temp: 98.8 ?F (37.1 ?C) 97.8 ?F (36.6 ?C) 98.4 ?F (36.9 ?C) 98.5 ?F (36.9 ?C)  ?TempSrc: Oral Oral Oral   ?SpO2: 98% 99% 98% 98%  ?Weight: 91.9 kg   89.9 kg  ?Height:      ? ? ?Intake/Output Summary (Last 24 hours) at 01/04/2022 0720 ?Last data filed at 01/04/2022 4403 ?Gross per 24 hour  ?Intake 698 ml  ?Output 2049 ml  ?Net -1351 ml  ? ? ?Filed Weights  ? 12/31/21 1153 01/03/22 0500 01/04/22 0336  ?Weight: 87.5 kg 91.9 kg 89.9 kg  ? ? ?Examination: ? ?General exam: On room air.  No distress.  Awake, still very slow to respond, flat affect, very poor historian.  Looks chronically ill and deconditioned. ?Respiratory system: Decreased breath sounds at bases bilaterally, no wheezing  ?cardiovascular system: Bradycardic intermittently; S1-S2 heard ?gastrointestinal system: Abdomen is distended slightly; soft and nontender.  Bowel sounds are heard  ?extremities: Mild lower extremity edema present; no clubbing ? ?Data Reviewed: I have personally reviewed following labs and imaging studies ? ?CBC: ?Recent Labs  ?Lab 12/31/21 ?1330 01/01/22 ?4742 01/02/22 ?5956  ?WBC 14.3* 10.0 7.4  ?NEUTROABS 12.8*  --  5.9  ?HGB 14.8 13.5 13.6  ?HCT 45.9 42.1 42.4  ?MCV 91.6 91.7 90.6  ?PLT 193 181 201  ? ? ?Basic Metabolic Panel: ?Recent Labs  ?  Lab 12/31/21 ?1330 01/01/22 ?2831 01/02/22 ?5176  ?NA 132* 136 136  ?K 3.8 3.9 3.4*  ?CL 97* 103 102  ?CO2 '26 26 27  '$ ?GLUCOSE 146* 125* 156*  ?BUN 24* 24* 24*  ?CREATININE 1.44* 1.37* 1.18  ?CALCIUM 9.3 8.6* 8.3*  ?MG  --   --  2.0  ? ? ?GFR: ?Estimated Creatinine Clearance: 59.4 mL/min (by C-G  formula based on SCr of 1.18 mg/dL). ?Liver Function Tests: ?Recent Labs  ?Lab 12/31/21 ?1330 01/01/22 ?1607  ?AST 22 37  ?ALT 9 <5  ?ALKPHOS 102 89  ?BILITOT 0.6 0.5  ?PROT 7.5 5.9*  ?ALBUMIN 3.5 2.7*  ? ? ?No results for input(s): LIPASE, AMYLASE in the last 168 hours. ?No results for input(s): AMMONIA in the last 168 hours. ?Coagulation Profile: ?No results for input(s): INR, PROTIME in the last 168 hours. ?Cardiac Enzymes: ?No results for input(s): CKTOTAL, CKMB, CKMBINDEX, TROPONINI in the last 168 hours. ?BNP (last 3 results) ?No results for input(s): PROBNP in the last 8760 hours. ?HbA1C: ?No results for input(s): HGBA1C in the last 72 hours. ? ?CBG: ?Recent Labs  ?Lab 01/02/22 ?2259 01/03/22 ?0724 01/03/22 ?1246 01/03/22 ?1623 01/03/22 ?2157  ?GLUCAP 234* 169* 166* 164* 226*  ? ? ?Lipid Profile: ?No results for input(s): CHOL, HDL, LDLCALC, TRIG, CHOLHDL, LDLDIRECT in the last 72 hours. ?Thyroid Function Tests: ?No results for input(s): TSH, T4TOTAL, FREET4, T3FREE, THYROIDAB in the last 72 hours. ?Anemia Panel: ?No results for input(s): VITAMINB12, FOLATE, FERRITIN, TIBC, IRON, RETICCTPCT in the last 72 hours. ?Sepsis Labs: ?Recent Labs  ?Lab 12/31/21 ?1339  ?LATICACIDVEN 1.4  ? ? ? ?Recent Results (from the past 240 hour(s))  ?Resp Panel by RT-PCR (Flu A&B, Covid) Nasopharyngeal Swab     Status: None  ? Collection Time: 12/31/21  1:29 PM  ? Specimen: Nasopharyngeal Swab; Nasopharyngeal(NP) swabs in vial transport medium  ?Result Value Ref Range Status  ? SARS Coronavirus 2 by RT PCR NEGATIVE NEGATIVE Final  ?  Comment: (NOTE) ?SARS-CoV-2 target nucleic acids are NOT DETECTED. ? ?The SARS-CoV-2 RNA is generally detectable in upper respiratory ?specimens during the acute phase of infection. The lowest ?concentration of SARS-CoV-2 viral copies this assay can detect is ?138 copies/mL. A negative result does not preclude SARS-Cov-2 ?infection and should not be used as the sole basis for treatment or ?other  patient management decisions. A negative result may occur with  ?improper specimen collection/handling, submission of specimen other ?than nasopharyngeal swab, presence of viral mutation(s) within the ?areas targeted by this assay, and inadequate number of viral ?copies(<138 copies/mL). A negative result must be combined with ?clinical observations, patient history, and epidemiological ?information. The expected result is Negative. ? ?Fact Sheet for Patients:  ?EntrepreneurPulse.com.au ? ?Fact Sheet for Healthcare Providers:  ?IncredibleEmployment.be ? ?This test is no t yet approved or cleared by the [place] FDA and  ?has been authorized for detection and/or diagnosis of SARS-CoV-2 by ?FDA under an Emergency Use Authorization (EUA). This EUA will remain  ?in effect (meaning this test can be used) for the duration of the ?COVID-19 declaration under Section 564(b)(1) of the Act, 21 ?U.S.C.section 360bbb-3(b)(1), unless the authorization is terminated  ?or revoked sooner.  ? ? ?  ? Influenza A by PCR NEGATIVE NEGATIVE Final  ? Influenza B by PCR NEGATIVE NEGATIVE Final  ?  Comment: (NOTE) ?The Xpert Xpress SARS-CoV-2/FLU/RSV plus assay is intended as an aid ?in the diagnosis of influenza from Nasopharyngeal swab specimens and ?should not be used as  a sole basis for treatment. Nasal washings and ?aspirates are unacceptable for Xpert Xpress SARS-CoV-2/FLU/RSV ?testing. ? ?Fact Sheet for Patients: ?EntrepreneurPulse.com.au ? ?Fact Sheet for Healthcare Providers: ?IncredibleEmployment.be ? ?This test is not yet approved or cleared by the [place] FDA and ?has been authorized for detection and/or diagnosis of SARS-CoV-2 by ?FDA under an Emergency Use Authorization (EUA). This EUA will remain ?in effect (meaning this test can be used) for the duration of the ?COVID-19 declaration under Section 564(b)(1) of the Act, 21 U.S.C. ?section  360bbb-3(b)(1), unless the authorization is terminated or ?revoked. ? ?Performed at [facility], [address] ?[place], [place] [address] ?  ?Culture, blood (routine x 2)     Status: None (Preliminary result)  ? Collecti

## 2022-01-05 LAB — CULTURE, BLOOD (ROUTINE X 2)
Culture: NO GROWTH
Culture: NO GROWTH

## 2022-01-05 LAB — GLUCOSE, CAPILLARY
Glucose-Capillary: 158 mg/dL — ABNORMAL HIGH (ref 70–99)
Glucose-Capillary: 185 mg/dL — ABNORMAL HIGH (ref 70–99)
Glucose-Capillary: 219 mg/dL — ABNORMAL HIGH (ref 70–99)

## 2022-01-05 MED ORDER — SODIUM CHLORIDE 0.9 % IV SOLN
1.0000 g | INTRAVENOUS | Status: DC
  Administered 2022-01-05: 1000 mg via INTRAVENOUS
  Filled 2022-01-05: qty 1

## 2022-01-05 MED ORDER — ERTAPENEM IV (FOR PTA / DISCHARGE USE ONLY): 1.0000 g | INTRAVENOUS | 0 refills | Status: AC

## 2022-01-05 MED ORDER — HEPARIN SOD (PORK) LOCK FLUSH 100 UNIT/ML IV SOLN
250.0000 [IU] | INTRAVENOUS | Status: AC | PRN
  Administered 2022-01-05: 250 [IU]

## 2022-01-05 MED ORDER — MIRTAZAPINE 7.5 MG PO TABS: 7.5000 mg | ORAL_TABLET | Freq: Every day | ORAL | 0 refills | Status: DC

## 2022-01-05 NOTE — Plan of Care (Signed)

## 2022-01-05 NOTE — Discharge Summary (Signed)
Physician Discharge Summary  ?[name] WYO:[id number] DOB: Jul 02, 1946 DOA: 12/31/2021 ? ?PCP: Josetta Huddle, MD ? ?Admit date: 12/31/2021 ?Discharge date: 01/05/2022 ? ?Admitted From: Home ?Disposition: Home ? ?Recommendations for Outpatient Follow-up:  ?Follow up with PCP in 1 week with repeat CBC/BMP ?Outpatient follow-up with urology ?Follow up in ED if symptoms worsen or new appear ? ? ?Home Health: PT/OT/RN.  Patient/family refused SNF ?Equipment/Devices: PICC line placed on 01/04/2022 ?Discharge Condition: Stable ?CODE STATUS: Full ?Diet recommendation: Heart healthy/carb modified ? ?Brief/Interim Summary: ?76 y.o. male with medical history significant of HTN, chronic bilateral lower extremity edema, GERD, HLD, BPH, parkinson's disease presented with weakness and fevers at home.  On presentation, UA was positive for leukocyte esterase and nitrites.  Chest x-ray was negative for consolidation.  He was started on IV fluids and antibiotics.  During the hospitalization, his condition has gradually improved.  Urology was consulted: Recommended conservative management and outpatient follow-up with urology.  Urine culture grew ESBL Klebsiella.  Urology recommended total 2 weeks of antibiotic.  PICC line was placed on 01/04/2022.  He will be discharged home today on IV ertapenem once a day.  Outpatient follow-up with PCP and urology.   ? ?Discharge Diagnoses:  ? 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  Currently on meropenem. ?-Blood cultures negative so far.  COVID-19/influenza negative on presentation.  Urine culture ESBL Klebsiella. ?-Urology recommended total of 2 weeks of antibiotics.  Urology also recommended that if patient had significant postvoid residual, patient will need straight in and out cath.  Outpatient follow-up with urology. ?-PICC line was placed on 01/04/2022.  He will be discharged home today on IV ertapenem  once a day.  Outpatient follow-up with PCP and urology.   ?  ?Generalized weakness ?-Possibly from above.  PT recommended SNF placement.  Patient/family refused SNF.  He will need home and PT/OT  ? ?Parkinson's disease ?-Continue Sinemet, amantadine and entacapone.  Outpatient follow-up with neurology ? ?Hypertension ?-Continue lisinopril and Lasix.  Outpatient follow-up. ? ?Hyperlipidemia ?-Continue simvastatin ? ?BPH ?-Continue tamsulosin ? ?Diabetes mellitus type 2 ?-A1c 6.4.  Continue carb modified diet.  Continue long-acting insulin.  Resume metformin.  Hold glimepiride.  Outpatient follow-up with PCP. ? ?Hyponatremia ?-Resolved ?  ?Hypokalemia ?-Replaced.  Continue replacement as an outpatient. ? ?Leukocytosis ?-Resolved ?  ?Chronic kidney disease stage IIIa ?-Creatinine stable.  Monitor ? ?Poor oral intake ?-patient has been started on Remeron for poor oral intake and poor sleep during the hospitalization.  Family think that appetite and sleep are improving.  We will continue Remeron 7.5 mg q. nightly at discharge.  Outpatient follow-up with PCP. ? ?Discharge Instructions ? ? ?Allergies as of 01/05/2022   ? ?   Reactions  ? Oxycodone Itching  ? This makes him itch  ? Sudafed [pseudoephedrine Hcl]   ? Problems urinating   ? ?  ? ?  ?Medication List  ?  ? ?STOP taking these medications   ? ?cephALEXin 500 MG capsule ?Commonly known as: KEFLEX ?  ?Doxycycline Hyclate 50 MG Tbec ?  ?glimepiride 4 MG tablet ?Commonly known as: AMARYL ?  ?levofloxacin 500 MG tablet ?Commonly known as: LEVAQUIN ?  ? ?  ? ?TAKE these medications   ? ?acetaminophen 500 MG tablet ?Commonly known as: TYLENOL ?Take 500 mg by mouth every 6 (six) hours as needed for fever. ?  ?amantadine 100 MG capsule ?Commonly known as: SYMMETREL ?  TAKE 1 CAPSULE BY MOUTH THREE TIMES A DAY ?What changed: See the new instructions. ?  ?carbidopa-levodopa 25-100 MG tablet ?Commonly known as: SINEMET IR ?3 at 6am, 3 at 10am, 3 at 2pm, 3 at 6pm ?What changed:   ?how much to take ?how to take this ?when to take this ?  ?cholecalciferol 1000 units tablet ?Commonly known as: VITAMIN D ?Take 2,000 Units by mouth daily. ?  ?entacapone 200 MG tablet ?Commonly known as: COMTAN ?Take 1 tablet (200 mg total) by mouth 3 (three) times daily. 1 tablet with first THREE dosages of levodopa ?What changed: additional instructions ?  ?ertapenem  IVPB ?Commonly known as: INVANZ ?Inject 1 g into the vein daily for 8 days. Indication:  ESBL Klebsiella UTI  ?First Dose: Yes ?Last Day of Therapy:  4/1 ?Labs - Once weekly:  CBC/D and BMP, ?Labs - Every other week:  ESR and CRP ?Method of administration: Mini-Bag Plus / Gravity ?Method of administration may be changed at the discretion of home infusion pharmacist based upon assessment of the patient and/or caregiver's ability to self-administer the medication ordered. ?  ?furosemide 20 MG tablet ?Commonly known as: LASIX ?Take 3 tablets (60 mg total) by mouth 2 (two) times daily. ?  ?Global Ease Inject Pen Needles 31G X 5 MM Misc ?Generic drug: Insulin Pen Needle ?Inject into the skin. ?  ?Lantus SoloStar 100 UNIT/ML Solostar Pen ?Generic drug: insulin glargine ?15 Units at bedtime. ?  ?lisinopril 5 MG tablet ?Commonly known as: ZESTRIL ?Take 5 mg by mouth daily. ?  ?Lotemax SM 0.38 % Gel ?Generic drug: Loteprednol Etabonate ?Place 1 drop into both eyes 2 (two) times daily. ?  ?metFORMIN 1000 MG tablet ?Commonly known as: GLUCOPHAGE ?Take 1,000 mg by mouth 2 (two) times daily with a meal. ?  ?mirtazapine 7.5 MG tablet ?Commonly known as: REMERON ?Take 1 tablet (7.5 mg total) by mouth at bedtime. ?  ?naproxen sodium 220 MG tablet ?Commonly known as: ALEVE ?Take 440 mg by mouth daily as needed (fever). ?  ?omeprazole 20 MG capsule ?Commonly known as: PRILOSEC ?Take 20 mg by mouth 2 (two) times daily before a meal. ?  ?potassium chloride SA 20 MEQ tablet ?Commonly known as: KLOR-CON M ?Take 20 mEq by mouth daily. ?  ?rOPINIRole 3 MG  tablet ?Commonly known as: REQUIP ?TAKE 1 TABLET BY MOUTH IN THE MORNING, 1 TABLET IN THE AFTERNOON, AND 1/2 TABLET IN THE EVENING. ?What changed:  ?how much to take ?how to take this ?when to take this ?Another medication with the same name was removed. Continue taking this medication, and follow the directions you see here. ?  ?Saw Palmetto 500 MG Caps ?Take 1 capsule by mouth daily. ?  ?simvastatin 40 MG tablet ?Commonly known as: ZOCOR ?Take 40 mg by mouth every evening. ?  ?tamsulosin 0.4 MG Caps capsule ?Commonly known as: FLOMAX ?Take 0.8 mg by mouth daily. ?  ?triamcinolone 55 MCG/ACT Aero nasal inhaler ?Commonly known as: NASACORT ?Place 2 sprays into the nose daily. ?What changed: Another medication with the same name was removed. Continue taking this medication, and follow the directions you see here. ?  ?vitamin B-12 1000 MCG tablet ?Commonly known as: CYANOCOBALAMIN ?Take 1,000 mcg by mouth daily. ?  ? ?  ? ?  ?  ? ? ?  ?Durable Medical Equipment  ?(From admission, onward)  ?  ? ? ?  ? ?  Start     Ordered  ? 01/04/22 1331  For  home use only DME Shower stool  Once       ?Comments: Transfer bench  ? 01/04/22 1330  ? 01/03/22 1221  For home use only DME standard manual wheelchair with seat cushion  Once       ?Comments: Patient suffers from weakness which impairs their ability to perform daily activities like dressing and grooming in the home.  A walker will not resolve issue with performing activities of daily living. A wheelchair will allow patient to safely perform daily activities. Patient can safely propel the wheelchair in the home or has a caregiver who can provide assistance. Length of need 6 months . ?Accessories: elevating leg rests (ELRs), wheel locks, extensions and anti-tippers.  ? 01/03/22 1220  ? 01/02/22 1338  For home use only DME Hospital bed  Once       ?Question Answer Comment  ?Length of Need 6 Months   ?The above medical condition requires: Patient requires the ability to reposition  frequently   ?Head must be elevated greater than: 30 degrees   ?Bed type Semi-electric   ?  ? 01/02/22 1337  ? ?  ?  ? ?  ? ? ?  ?Discharge Care Instructions  ?(From admission, onward)  ?  ? ? ?  ? ?  Start     Order

## 2022-01-05 NOTE — Progress Notes (Signed)
IV team Rn has seen pt, assessed and addressed picc line for discharge. ? ? ?Pt denies pain/discomfort. VSS. Spouse at bedside. ? ?Nursing assistant escorted pt to car port via wheelchair. ? ?

## 2022-01-05 NOTE — Progress Notes (Signed)
Occupational Therapy Treatment ?Patient Details ?[name] ?MRN: [id number] ?DOB: Apr 12, 1946 ?Today's Date: 01/05/2022 ? ? ?History of present illness Patient is a 76 year old male who presented with weakness and fever from home. patient was found to have UTI. PMH: HTN, chronic bilateral lower extremity edema, GERD, HLD, BPH, parkinson's disease. ?  ?OT comments ? Treatment focused on ADLs and functional mobility to perform ADLs. Patient min assist to ambulate to bathroom with RW. Able to perform toileting and bathing task seated on toilet. Patient min guard to stand at sink and perform grooming. Needing min assist for steadying and verbal cues to initiate steps with freezing. Patient making progress. Continue to recommend [place].   ? ?Recommendations for follow up therapy are one component of a multi-disciplinary discharge planning process, led by the attending physician.  Recommendations may be updated based on patient status, additional functional criteria and insurance authorization. ?   ?Follow Up Recommendations ? Home health OT  ?  ?Assistance Recommended at Discharge Frequent or constant Supervision/Assistance  ?Patient can return home with the following ? A lot of help with bathing/dressing/bathroom;Assistance with cooking/housework;Direct supervision/assist for medications management;Direct supervision/assist for financial management;Assist for transportation;Help with stairs or ramp for entrance;A little help with walking and/or transfers ?  ?Equipment Recommendations ? None recommended by OT  ?  ?Recommendations for Other Services   ? ?  ?Precautions / Restrictions Precautions ?Precautions: Fall ?Restrictions ?Weight Bearing Restrictions: No  ? ? ?  ? ?Mobility Bed Mobility ?Overal bed mobility: Needs Assistance ?Bed Mobility: Supine to Sit ?  ?  ?Supine to sit: Min assist, HOB elevated ?  ?  ?General bed mobility comments: Min assist for hand hold to transfer to edge of bed. Increased time to scoot  forward. ?  ? ?Transfers ?Overall transfer level: Needs assistance ?Equipment used: Rolling walker (2 wheels) ?Transfers: Sit to/from Stand ?Sit to Stand: Min guard ?  ?  ?  ?  ?  ?General transfer comment: Min guard to stand but verbal cues to come up on toes to reduce posteror lean. Min assist with RW to ambualte to bathroom with one major loss of balance. Verbal cues to take steps and persistent "freezing" due to not having Parkinson's medications. ?  ?  ?Balance Overall balance assessment: Needs assistance ?Sitting-balance support: No upper extremity supported, Feet supported ?Sitting balance-Leahy Scale: Good ?  ?  ?Standing balance support: Reliant on assistive device for balance, During functional activity ?Standing balance-Leahy Scale: Fair ?Standing balance comment: reliant on walker and at times therapist to maintain balance ?  ?  ?  ?  ?  ?  ?  ?  ?  ?  ?  ?   ? ?ADL either performed or assessed with clinical judgement  ? ?ADL Overall ADL's : Needs assistance/impaired ?  ?  ?Grooming: Standing;Oral care;Wash/dry face ?Grooming Details (indicate cue type and reason): stood at sink for grooming tasks - with min guard. ?Upper Body Bathing: Set up;Sitting ?Upper Body Bathing Details (indicate cue type and reason): seated on toilet ?Lower Body Bathing: Minimal assistance;Sit to/from stand ?Lower Body Bathing Details (indicate cue type and reason): able to do partial bath in seated position  would need assistance to wash buttocks due to impaired balance in standing ?  ?  ?  ?  ?Toilet Transfer: Financial planner (2 wheels);Grab bars ?Toilet Transfer Details (indicate cue type and reason): verbal cue to use grab bar and for steps due to freezing and festinating gait ?  Toileting- Water quality scientist and Hygiene: Sit to/from stand;Moderate assistance ?Toileting - Clothing Manipulation Details (indicate cue type and reason): today mod assist neede for clothing management ?  ?  ?Functional  mobility during ADLs: Minimal assistance;Rolling walker (2 wheels) ?General ADL Comments: Min assist for hand hold to transfer to edge of bed. Min assist for steadying and verbal cues for ambulation to bathroom with RW. One large loss of balance that therapist had to  correct. ?  ? ?Extremity/Trunk Assessment Upper Extremity Assessment ?Upper Extremity Assessment: Overall WFL for tasks assessed ?  ?  ?  ?  ?  ? ?Vision Baseline Vision/History: 1 Wears glasses ?  ?  ?Perception   ?  ?Praxis   ?  ? ?Cognition Arousal/Alertness: Awake/alert ?Behavior During Therapy: [facility] for tasks assessed/performed ?Overall Cognitive Status: Within Functional Limits for tasks assessed ?  ?  ?  ?  ?  ?  ?  ?  ?  ?  ?  ?  ?  ?  ?  ?  ?  ?  ?  ?   ?Exercises   ? ?  ?Shoulder Instructions   ? ? ?  ?General Comments    ? ? ?Pertinent Vitals/ Pain       Pain Assessment ?Pain Assessment: No/denies pain ? ?Home Living   ?  ?  ?  ?  ?  ?  ?  ?  ?  ?  ?  ?  ?  ?  ?  ?  ?  ?  ? ?  ?Prior Functioning/Environment    ?  ?  ?  ?   ? ?Frequency ? Min 2X/week  ? ? ? ? ?  ?Progress Toward Goals ? ?OT Goals(current goals can now be found in the care plan section) ? Progress towards OT goals: Progressing toward goals ? ?Acute Rehab OT Goals ?Patient Stated Goal: improve independence ?OT Goal Formulation: With patient ?Time For Goal Achievement: 01/15/22 ?Potential to Achieve Goals: Good  ?Plan Discharge plan needs to be updated   ? ?Co-evaluation ? ? ?   ?  ?  ?OT goals addressed during session: ADL's and self-care ?  ? ?  ?AM-PAC OT "6 Clicks" Daily Activity     ?Outcome Measure ? ? Help from another person eating meals?: A Little ?Help from another person taking care of personal grooming?: A Little ?Help from another person toileting, which includes using toliet, bedpan, or urinal?: A Lot ?Help from another person bathing (including washing, rinsing, drying)?: A Lot ?Help from another person to put on and taking off regular upper body clothing?: A  Little ?Help from another person to put on and taking off regular lower body clothing?: A Lot ?6 Click Score: 15 ? ?  ?End of Session Equipment Utilized During Treatment: Rolling walker (2 wheels);Gait belt ? ?OT Visit Diagnosis: Unsteadiness on feet (R26.81);Other abnormalities of gait and mobility (R26.89);Muscle weakness (generalized) (M62.81) ?  ?Activity Tolerance Patient tolerated treatment well ?  ?Patient Left in chair;with call bell/phone within reach;with family/visitor present ?  ?Nurse Communication Mobility status ?  ? ?   ? ?Time: 1610-9604 ?OT Time Calculation (min): 32 min ? ?Charges: OT General Charges ?$OT Visit: 1 Visit ?OT Treatments ?$Self Care/Home Management : 23-37 mins ? ?Bellamy Rubey, OTR/L ?Acute Care Rehab Services  ?Office [phone] ?Pager: [phone]  ? ?Ariel Wingrove L Calixto Pavel ?01/05/2022, 10:42 AM ?

## 2022-01-05 NOTE — Progress Notes (Signed)
Spoke with TOC- SW, home health also called wife to update the arrive for IV infusion teaching planned for today @ 4 pm. Pt and wife aware. Initial dose of IV antibiotic given for today. SRP, RN ?

## 2022-01-06 DIAGNOSIS — G20 Parkinson's disease: Secondary | ICD-10-CM | POA: Diagnosis not present

## 2022-01-06 DIAGNOSIS — Z794 Long term (current) use of insulin: Secondary | ICD-10-CM | POA: Diagnosis not present

## 2022-01-06 DIAGNOSIS — I119 Hypertensive heart disease without heart failure: Secondary | ICD-10-CM | POA: Diagnosis not present

## 2022-01-06 DIAGNOSIS — Z7984 Long term (current) use of oral hypoglycemic drugs: Secondary | ICD-10-CM | POA: Diagnosis not present

## 2022-01-06 DIAGNOSIS — E119 Type 2 diabetes mellitus without complications: Secondary | ICD-10-CM | POA: Diagnosis not present

## 2022-01-06 DIAGNOSIS — Z452 Encounter for adjustment and management of vascular access device: Secondary | ICD-10-CM | POA: Diagnosis not present

## 2022-01-06 DIAGNOSIS — N390 Urinary tract infection, site not specified: Secondary | ICD-10-CM | POA: Diagnosis not present

## 2022-01-06 DIAGNOSIS — E7800 Pure hypercholesterolemia, unspecified: Secondary | ICD-10-CM | POA: Diagnosis not present

## 2022-01-06 DIAGNOSIS — Z96659 Presence of unspecified artificial knee joint: Secondary | ICD-10-CM | POA: Diagnosis not present

## 2022-01-06 DIAGNOSIS — K219 Gastro-esophageal reflux disease without esophagitis: Secondary | ICD-10-CM | POA: Diagnosis not present

## 2022-01-06 DIAGNOSIS — Z9181 History of falling: Secondary | ICD-10-CM | POA: Diagnosis not present

## 2022-01-06 DIAGNOSIS — R1313 Dysphagia, pharyngeal phase: Secondary | ICD-10-CM | POA: Diagnosis not present

## 2022-01-08 DIAGNOSIS — R1313 Dysphagia, pharyngeal phase: Secondary | ICD-10-CM | POA: Diagnosis not present

## 2022-01-08 DIAGNOSIS — G20 Parkinson's disease: Secondary | ICD-10-CM | POA: Diagnosis not present

## 2022-01-08 DIAGNOSIS — Z452 Encounter for adjustment and management of vascular access device: Secondary | ICD-10-CM | POA: Diagnosis not present

## 2022-01-08 DIAGNOSIS — K219 Gastro-esophageal reflux disease without esophagitis: Secondary | ICD-10-CM | POA: Diagnosis not present

## 2022-01-08 DIAGNOSIS — Z794 Long term (current) use of insulin: Secondary | ICD-10-CM | POA: Diagnosis not present

## 2022-01-08 DIAGNOSIS — Z9181 History of falling: Secondary | ICD-10-CM | POA: Diagnosis not present

## 2022-01-08 DIAGNOSIS — Z7984 Long term (current) use of oral hypoglycemic drugs: Secondary | ICD-10-CM | POA: Diagnosis not present

## 2022-01-08 DIAGNOSIS — E7800 Pure hypercholesterolemia, unspecified: Secondary | ICD-10-CM | POA: Diagnosis not present

## 2022-01-08 DIAGNOSIS — Z1612 Extended spectrum beta lactamase (ESBL) resistance: Secondary | ICD-10-CM | POA: Diagnosis not present

## 2022-01-08 DIAGNOSIS — N390 Urinary tract infection, site not specified: Secondary | ICD-10-CM | POA: Diagnosis not present

## 2022-01-08 DIAGNOSIS — Z96659 Presence of unspecified artificial knee joint: Secondary | ICD-10-CM | POA: Diagnosis not present

## 2022-01-08 DIAGNOSIS — I119 Hypertensive heart disease without heart failure: Secondary | ICD-10-CM | POA: Diagnosis not present

## 2022-01-08 DIAGNOSIS — E119 Type 2 diabetes mellitus without complications: Secondary | ICD-10-CM | POA: Diagnosis not present

## 2022-01-08 NOTE — Telephone Encounter (Signed)
Patients wife is to call back. VV is fine per Dr.[name] ?

## 2022-01-08 NOTE — Progress Notes (Signed)
? ?Virtual Visit Via Video  ? ?The purpose of this virtual visit is to provide medical care while limiting exposure to the novel coronavirus.   ? ?Consent was obtained for video visit:  Yes.   ?Answered questions that patient had about telehealth interaction:  Yes.   ?I discussed the limitations, risks, security and privacy concerns of performing an evaluation and management service by telemedicine. I also discussed with the patient that there may be a patient responsible charge related to this service. The patient expressed understanding and agreed to proceed. ? ?Pt location: Home ?Physician Location: office ?Name of referring provider:  Josetta Huddle, MD ?I connected with [name] at patients initiation/request on 01/09/2022 at  3:30 PM EDT by video enabled telemedicine application and verified that I am speaking with the correct person using two identifiers. ?Pt MRN:  [id number] ?Pt DOB:  24-May-1946 ?Video Participants:  [name];  wife supplements hx ? ?Assessment/Plan:  ? ?1.  Parkinsons Disease, diagnosed 2010 with symptoms to 2008 ? - increase and move to 2 hour intervals so that he takes carbidopa/levodopa 25/100, 3 at 7 to 8 AM, 2 tablets at 10 AM, 2 tablets at 12 PM, 2 tablets at 2 PM, 2 tablets at 4 PM, 2 tablets at 6 PM.  This only raises the total dose by 1 tablet, but hopefully will prevent the wearing off that he is noticing at 2-1/2 hours.   ? -For now, he will continue the entacapone.  He may decrease that in the future, as he is not sure he noticed a difference with the addition last visit. ? -For now, we will continue ropinirole 3 mg, 1/1/0.5 ? -Wean amantadine.  He has been on that a long time, but I do not think it is doing much.  He has never had dyskinesia.  He will decrease that by 1 pill/week until he is off of that. ? -Currently receiving home physical therapy. ? -Likely the reason that things got so much worse is hospitalization and urinary tract infection with urosepsis. ? ?2.   Insomnia and poor oral intake ? -Mirtazapine added by hospital physician.  On 7.5 mg nightly. ? ? ? ? ?Subjective:  ? ?[name] was seen today in follow up for Parkinsons disease.  My previous records were reviewed prior to todays visit as well as outside records available to me.  We started entacapone last visit.  We encouraged physical therapy, but that was declined.  However, he did call me about a month later requesting physical therapy, which was ordered.  Last visit, the patient had increased his levodopa on his own and I told him not to do that any longer.  He emailed me in November and again went up on the levodopa on his own, stating that he was taking 4 tablets with the first 2 dosages.  We emailed him back and told him not to do this.  He wanted to know what he could do, and we offered him a work in appointment, which was declined in December.  The patient was recently in the hospital from March 19 to March 24.  Records are reviewed.  This was due to urinary tract infection with associated weakness and fever.  Patient also had some hyponatremia and hypokalemia in the hospital.  Patient was started on mirtazapine in the hospital for trouble sleeping and decreased oral intake.  wife states that he has had UTI's off and on since May with multiple caths.  He  now has a PICC line for abx.  PT was just started today with [facility].  Mostly walking with the walker but when really sick unable to walk.  Was initially confused but no hallucinations.  That has all resolved.   ? ?Current prescribed movement disorder medications: ?Carbidopa/levodopa 25/100, 3 tablets at 6 AM/10 AM/2 PM/6 PM ?Entacapone, 200 mg, 1 with first 3 dosages of levodopa (added last visit) ?Amantadine 1 tablet tid (on it for tremor and not dyskinesia) ?requip 3 mg, 1 tablet in the morning, 1 tablet in the morning, 1 in the afternoon, half tablet in the evening  ?Mirtazapine, 7.5 mg at bed (started in the hospital) ? ? ?PREVIOUS MEDICATIONS:  neupro (helped but costly); requip; levodopa IR ? ?ALLERGIES:   ?Allergies  ?Allergen Reactions  ? Oxycodone Itching  ?  This makes him itch  ? Sudafed [Pseudoephedrine Hcl]   ?  Problems urinating   ? ? ?CURRENT MEDICATIONS:  ?Outpatient Encounter Medications as of 01/09/2022  ?Medication Sig  ? acetaminophen (TYLENOL) 500 MG tablet Take 500 mg by mouth every 6 (six) hours as needed for fever.  ? amantadine (SYMMETREL) 100 MG capsule TAKE 1 CAPSULE BY MOUTH THREE TIMES A DAY (Patient taking differently: Take 100 mg by mouth 3 (three) times daily.)  ? carbidopa-levodopa (SINEMET IR) 25-100 MG tablet 3 at 6am, 3 at 10am, 3 at 2pm, 3 at 6pm (Patient taking differently: Take 3 tablets by mouth 4 (four) times daily. 3 at 6am, 3 at 10am, 3 at 2pm, 3 at 6pm)  ? cholecalciferol (VITAMIN D) 1000 UNITS tablet Take 2,000 Units by mouth daily.  ? entacapone (COMTAN) 200 MG tablet Take 1 tablet (200 mg total) by mouth 3 (three) times daily. 1 tablet with first THREE dosages of levodopa (Patient taking differently: Take 200 mg by mouth 3 (three) times daily.)  ? ertapenem (INVANZ) IVPB Inject 1 g into the vein daily for 8 days. Indication:  ESBL Klebsiella UTI  ?First Dose: Yes ?Last Day of Therapy:  4/1 ?Labs - Once weekly:  CBC/D and BMP, ?Labs - Every other week:  ESR and CRP ?Method of administration: Mini-Bag Plus / Gravity ?Method of administration may be changed at the discretion of home infusion pharmacist based upon assessment of the patient and/or caregiver's ability to self-administer the medication ordered.  ? furosemide (LASIX) 20 MG tablet Take 3 tablets (60 mg total) by mouth 2 (two) times daily.  ? GLOBAL EASE INJECT PEN NEEDLES 31G X 5 MM MISC Inject into the skin.  ? LANTUS SOLOSTAR 100 UNIT/ML Solostar Pen 15 Units at bedtime.  ? lisinopril (ZESTRIL) 5 MG tablet Take 5 mg by mouth daily.  ? Loteprednol Etabonate (LOTEMAX SM) 0.38 % GEL Place 1 drop into both eyes 2 (two) times daily.  ? metFORMIN (GLUCOPHAGE)  1000 MG tablet Take 1,000 mg by mouth 2 (two) times daily with a meal.   ? mirtazapine (REMERON) 7.5 MG tablet Take 1 tablet (7.5 mg total) by mouth at bedtime.  ? naproxen sodium (ALEVE) 220 MG tablet Take 440 mg by mouth daily as needed (fever).  ? omeprazole (PRILOSEC) 20 MG capsule Take 20 mg by mouth 2 (two) times daily before a meal.  ? potassium chloride SA (KLOR-CON) 20 MEQ tablet Take 20 mEq by mouth daily.  ? rOPINIRole (REQUIP) 3 MG tablet TAKE 1 TABLET BY MOUTH IN THE MORNING, 1 TABLET IN THE AFTERNOON, AND 1/2 TABLET IN THE EVENING. (Patient taking differently: Take 1.5-3 mg by mouth  See admin instructions. TAKE 1 TABLET BY MOUTH IN THE MORNING, 1 TABLET IN THE AFTERNOON, AND 1/2 TABLET IN THE EVENING.)  ? Saw Palmetto 500 MG CAPS Take 1 capsule by mouth daily.  ? simvastatin (ZOCOR) 40 MG tablet Take 40 mg by mouth every evening.  ? tamsulosin (FLOMAX) 0.4 MG CAPS capsule Take 0.8 mg by mouth daily.  ? triamcinolone (NASACORT) 55 MCG/ACT AERO nasal inhaler Place 2 sprays into the nose daily.  ? vitamin B-12 (CYANOCOBALAMIN) 1000 MCG tablet Take 1,000 mcg by mouth daily.  ? ?No facility-administered encounter medications on file as of 01/09/2022.  ? ? ?Objective:  ? ?PHYSICAL EXAMINATION:   ? ?VITALS:   ?There were no vitals filed for this visit. ? ? ?Wt Readings from Last 3 Encounters:  ?01/04/22 198 lb 4.8 oz (89.9 kg)  ?08/29/21 204 lb 11.2 oz (92.9 kg)  ?07/24/21 204 lb (92.5 kg)  ? ? ?GEN:  The patient appears stated age and is in NAD. ?HEENT:  Normocephalic, atraumatic.  The mucous membranes are moist.  ? ?Neurological examination: ? ?Orientation: The patient is alert and oriented x3. ?Cranial nerves: There is good facial symmetry with min facial hypomimia. The speech is fluent and clear.  ?Motor: Strength is at least antigravity x4. ? ?Movement examination: ?Tone: Unable ?Abnormal movements: none seen today ?Coordination:  There is min decremation with finger taps bilaterally. ?Gait and Station:  The patient pushes off to arise.  He has mild start hesitation.  He is somewhat short stepped in his home.  He has some trouble in the turn and then does fairly well.  He is not using an ambulatory assistive de

## 2022-01-08 NOTE — Telephone Encounter (Signed)
Patient's wife Izora Gala called back to report the patient is very weak due to an untreated UTI. ? ?She is wanting to know what to do in relation to the patient's appointment tomorrow and asking for advice. ? ?She'd like to bring him in but he is very weak. Please advise? ?

## 2022-01-09 ENCOUNTER — Telehealth (INDEPENDENT_AMBULATORY_CARE_PROVIDER_SITE_OTHER): Payer: Medicare Other | Admitting: Neurology

## 2022-01-09 ENCOUNTER — Other Ambulatory Visit: Payer: Self-pay

## 2022-01-09 ENCOUNTER — Encounter: Payer: Self-pay | Admitting: Neurology

## 2022-01-09 VITALS — Ht 72.0 in | Wt 192.0 lb

## 2022-01-09 DIAGNOSIS — Z7984 Long term (current) use of oral hypoglycemic drugs: Secondary | ICD-10-CM | POA: Diagnosis not present

## 2022-01-09 DIAGNOSIS — K219 Gastro-esophageal reflux disease without esophagitis: Secondary | ICD-10-CM | POA: Diagnosis not present

## 2022-01-09 DIAGNOSIS — R1313 Dysphagia, pharyngeal phase: Secondary | ICD-10-CM | POA: Diagnosis not present

## 2022-01-09 DIAGNOSIS — G20 Parkinson's disease: Secondary | ICD-10-CM

## 2022-01-09 DIAGNOSIS — Z452 Encounter for adjustment and management of vascular access device: Secondary | ICD-10-CM | POA: Diagnosis not present

## 2022-01-09 DIAGNOSIS — I119 Hypertensive heart disease without heart failure: Secondary | ICD-10-CM | POA: Diagnosis not present

## 2022-01-09 DIAGNOSIS — Z96659 Presence of unspecified artificial knee joint: Secondary | ICD-10-CM | POA: Diagnosis not present

## 2022-01-09 DIAGNOSIS — N390 Urinary tract infection, site not specified: Secondary | ICD-10-CM | POA: Diagnosis not present

## 2022-01-09 DIAGNOSIS — E7800 Pure hypercholesterolemia, unspecified: Secondary | ICD-10-CM | POA: Diagnosis not present

## 2022-01-09 DIAGNOSIS — E119 Type 2 diabetes mellitus without complications: Secondary | ICD-10-CM | POA: Diagnosis not present

## 2022-01-09 DIAGNOSIS — Z794 Long term (current) use of insulin: Secondary | ICD-10-CM | POA: Diagnosis not present

## 2022-01-09 DIAGNOSIS — R3914 Feeling of incomplete bladder emptying: Secondary | ICD-10-CM | POA: Diagnosis not present

## 2022-01-09 DIAGNOSIS — Z9181 History of falling: Secondary | ICD-10-CM | POA: Diagnosis not present

## 2022-01-10 MED ORDER — CARBIDOPA-LEVODOPA 25-100 MG PO TABS
ORAL_TABLET | ORAL | 1 refills | Status: DC
Start: 2022-01-10 — End: 2022-01-31

## 2022-01-11 DIAGNOSIS — K219 Gastro-esophageal reflux disease without esophagitis: Secondary | ICD-10-CM | POA: Diagnosis not present

## 2022-01-11 DIAGNOSIS — G20 Parkinson's disease: Secondary | ICD-10-CM | POA: Diagnosis not present

## 2022-01-11 DIAGNOSIS — E7800 Pure hypercholesterolemia, unspecified: Secondary | ICD-10-CM | POA: Diagnosis not present

## 2022-01-11 DIAGNOSIS — Z7984 Long term (current) use of oral hypoglycemic drugs: Secondary | ICD-10-CM | POA: Diagnosis not present

## 2022-01-11 DIAGNOSIS — I119 Hypertensive heart disease without heart failure: Secondary | ICD-10-CM | POA: Diagnosis not present

## 2022-01-11 DIAGNOSIS — R1313 Dysphagia, pharyngeal phase: Secondary | ICD-10-CM | POA: Diagnosis not present

## 2022-01-11 DIAGNOSIS — Z96659 Presence of unspecified artificial knee joint: Secondary | ICD-10-CM | POA: Diagnosis not present

## 2022-01-11 DIAGNOSIS — E119 Type 2 diabetes mellitus without complications: Secondary | ICD-10-CM | POA: Diagnosis not present

## 2022-01-11 DIAGNOSIS — Z794 Long term (current) use of insulin: Secondary | ICD-10-CM | POA: Diagnosis not present

## 2022-01-11 DIAGNOSIS — Z452 Encounter for adjustment and management of vascular access device: Secondary | ICD-10-CM | POA: Diagnosis not present

## 2022-01-11 DIAGNOSIS — Z9181 History of falling: Secondary | ICD-10-CM | POA: Diagnosis not present

## 2022-01-11 DIAGNOSIS — N390 Urinary tract infection, site not specified: Secondary | ICD-10-CM | POA: Diagnosis not present

## 2022-01-13 DIAGNOSIS — N390 Urinary tract infection, site not specified: Secondary | ICD-10-CM | POA: Diagnosis not present

## 2022-01-15 DIAGNOSIS — I119 Hypertensive heart disease without heart failure: Secondary | ICD-10-CM | POA: Diagnosis not present

## 2022-01-15 DIAGNOSIS — Z9181 History of falling: Secondary | ICD-10-CM | POA: Diagnosis not present

## 2022-01-15 DIAGNOSIS — Z96659 Presence of unspecified artificial knee joint: Secondary | ICD-10-CM | POA: Diagnosis not present

## 2022-01-15 DIAGNOSIS — N390 Urinary tract infection, site not specified: Secondary | ICD-10-CM | POA: Diagnosis not present

## 2022-01-15 DIAGNOSIS — Z794 Long term (current) use of insulin: Secondary | ICD-10-CM | POA: Diagnosis not present

## 2022-01-15 DIAGNOSIS — R1313 Dysphagia, pharyngeal phase: Secondary | ICD-10-CM | POA: Diagnosis not present

## 2022-01-15 DIAGNOSIS — E119 Type 2 diabetes mellitus without complications: Secondary | ICD-10-CM | POA: Diagnosis not present

## 2022-01-15 DIAGNOSIS — G20 Parkinson's disease: Secondary | ICD-10-CM | POA: Diagnosis not present

## 2022-01-15 DIAGNOSIS — Z452 Encounter for adjustment and management of vascular access device: Secondary | ICD-10-CM | POA: Diagnosis not present

## 2022-01-15 DIAGNOSIS — K219 Gastro-esophageal reflux disease without esophagitis: Secondary | ICD-10-CM | POA: Diagnosis not present

## 2022-01-15 DIAGNOSIS — E7800 Pure hypercholesterolemia, unspecified: Secondary | ICD-10-CM | POA: Diagnosis not present

## 2022-01-15 DIAGNOSIS — Z7984 Long term (current) use of oral hypoglycemic drugs: Secondary | ICD-10-CM | POA: Diagnosis not present

## 2022-01-16 DIAGNOSIS — E119 Type 2 diabetes mellitus without complications: Secondary | ICD-10-CM | POA: Diagnosis not present

## 2022-01-16 DIAGNOSIS — I119 Hypertensive heart disease without heart failure: Secondary | ICD-10-CM | POA: Diagnosis not present

## 2022-01-16 DIAGNOSIS — Z7984 Long term (current) use of oral hypoglycemic drugs: Secondary | ICD-10-CM | POA: Diagnosis not present

## 2022-01-16 DIAGNOSIS — N390 Urinary tract infection, site not specified: Secondary | ICD-10-CM | POA: Diagnosis not present

## 2022-01-16 DIAGNOSIS — Z794 Long term (current) use of insulin: Secondary | ICD-10-CM | POA: Diagnosis not present

## 2022-01-16 DIAGNOSIS — E7800 Pure hypercholesterolemia, unspecified: Secondary | ICD-10-CM | POA: Diagnosis not present

## 2022-01-16 DIAGNOSIS — K219 Gastro-esophageal reflux disease without esophagitis: Secondary | ICD-10-CM | POA: Diagnosis not present

## 2022-01-16 DIAGNOSIS — R1313 Dysphagia, pharyngeal phase: Secondary | ICD-10-CM | POA: Diagnosis not present

## 2022-01-16 DIAGNOSIS — Z96659 Presence of unspecified artificial knee joint: Secondary | ICD-10-CM | POA: Diagnosis not present

## 2022-01-16 DIAGNOSIS — Z452 Encounter for adjustment and management of vascular access device: Secondary | ICD-10-CM | POA: Diagnosis not present

## 2022-01-16 DIAGNOSIS — Z9181 History of falling: Secondary | ICD-10-CM | POA: Diagnosis not present

## 2022-01-16 DIAGNOSIS — G20 Parkinson's disease: Secondary | ICD-10-CM | POA: Diagnosis not present

## 2022-01-17 ENCOUNTER — Other Ambulatory Visit: Payer: Self-pay | Admitting: Urology

## 2022-01-17 DIAGNOSIS — D49511 Neoplasm of unspecified behavior of right kidney: Secondary | ICD-10-CM

## 2022-01-23 DIAGNOSIS — G20 Parkinson's disease: Secondary | ICD-10-CM | POA: Diagnosis not present

## 2022-01-23 DIAGNOSIS — Z452 Encounter for adjustment and management of vascular access device: Secondary | ICD-10-CM | POA: Diagnosis not present

## 2022-01-23 DIAGNOSIS — N390 Urinary tract infection, site not specified: Secondary | ICD-10-CM | POA: Diagnosis not present

## 2022-01-23 DIAGNOSIS — Z7984 Long term (current) use of oral hypoglycemic drugs: Secondary | ICD-10-CM | POA: Diagnosis not present

## 2022-01-23 DIAGNOSIS — R1313 Dysphagia, pharyngeal phase: Secondary | ICD-10-CM | POA: Diagnosis not present

## 2022-01-23 DIAGNOSIS — K219 Gastro-esophageal reflux disease without esophagitis: Secondary | ICD-10-CM | POA: Diagnosis not present

## 2022-01-23 DIAGNOSIS — E7800 Pure hypercholesterolemia, unspecified: Secondary | ICD-10-CM | POA: Diagnosis not present

## 2022-01-23 DIAGNOSIS — I119 Hypertensive heart disease without heart failure: Secondary | ICD-10-CM | POA: Diagnosis not present

## 2022-01-23 DIAGNOSIS — E119 Type 2 diabetes mellitus without complications: Secondary | ICD-10-CM | POA: Diagnosis not present

## 2022-01-23 DIAGNOSIS — Z9181 History of falling: Secondary | ICD-10-CM | POA: Diagnosis not present

## 2022-01-23 DIAGNOSIS — Z96659 Presence of unspecified artificial knee joint: Secondary | ICD-10-CM | POA: Diagnosis not present

## 2022-01-23 DIAGNOSIS — Z794 Long term (current) use of insulin: Secondary | ICD-10-CM | POA: Diagnosis not present

## 2022-01-24 DIAGNOSIS — H1045 Other chronic allergic conjunctivitis: Secondary | ICD-10-CM | POA: Diagnosis not present

## 2022-01-31 ENCOUNTER — Other Ambulatory Visit: Payer: Self-pay | Admitting: Neurology

## 2022-01-31 DIAGNOSIS — G20 Parkinson's disease: Secondary | ICD-10-CM

## 2022-01-31 DIAGNOSIS — N138 Other obstructive and reflux uropathy: Secondary | ICD-10-CM | POA: Diagnosis not present

## 2022-01-31 DIAGNOSIS — N200 Calculus of kidney: Secondary | ICD-10-CM | POA: Diagnosis not present

## 2022-01-31 DIAGNOSIS — N281 Cyst of kidney, acquired: Secondary | ICD-10-CM | POA: Diagnosis not present

## 2022-01-31 DIAGNOSIS — E1165 Type 2 diabetes mellitus with hyperglycemia: Secondary | ICD-10-CM | POA: Diagnosis not present

## 2022-01-31 DIAGNOSIS — N390 Urinary tract infection, site not specified: Secondary | ICD-10-CM | POA: Diagnosis not present

## 2022-02-08 DIAGNOSIS — I119 Hypertensive heart disease without heart failure: Secondary | ICD-10-CM | POA: Diagnosis not present

## 2022-02-08 DIAGNOSIS — M4316 Spondylolisthesis, lumbar region: Secondary | ICD-10-CM | POA: Diagnosis not present

## 2022-02-08 DIAGNOSIS — N390 Urinary tract infection, site not specified: Secondary | ICD-10-CM | POA: Diagnosis not present

## 2022-02-08 DIAGNOSIS — K219 Gastro-esophageal reflux disease without esophagitis: Secondary | ICD-10-CM | POA: Diagnosis not present

## 2022-02-08 DIAGNOSIS — M25551 Pain in right hip: Secondary | ICD-10-CM | POA: Diagnosis not present

## 2022-02-08 DIAGNOSIS — Z452 Encounter for adjustment and management of vascular access device: Secondary | ICD-10-CM | POA: Diagnosis not present

## 2022-02-08 DIAGNOSIS — Z96659 Presence of unspecified artificial knee joint: Secondary | ICD-10-CM | POA: Diagnosis not present

## 2022-02-08 DIAGNOSIS — Z7984 Long term (current) use of oral hypoglycemic drugs: Secondary | ICD-10-CM | POA: Diagnosis not present

## 2022-02-08 DIAGNOSIS — G20 Parkinson's disease: Secondary | ICD-10-CM | POA: Diagnosis not present

## 2022-02-08 DIAGNOSIS — Z794 Long term (current) use of insulin: Secondary | ICD-10-CM | POA: Diagnosis not present

## 2022-02-08 DIAGNOSIS — R1313 Dysphagia, pharyngeal phase: Secondary | ICD-10-CM | POA: Diagnosis not present

## 2022-02-08 DIAGNOSIS — Z9181 History of falling: Secondary | ICD-10-CM | POA: Diagnosis not present

## 2022-02-08 DIAGNOSIS — E119 Type 2 diabetes mellitus without complications: Secondary | ICD-10-CM | POA: Diagnosis not present

## 2022-02-08 DIAGNOSIS — E7800 Pure hypercholesterolemia, unspecified: Secondary | ICD-10-CM | POA: Diagnosis not present

## 2022-02-19 ENCOUNTER — Telehealth: Payer: Self-pay | Admitting: Neurology

## 2022-02-19 NOTE — Telephone Encounter (Signed)
Patients wife called and stated [name] is having a hard time.  She stated he is having off times lately and he has been falling a lot.  He doesn't have a return appt until sept.  She didn't know if he could come in sooner for an appointment or if there was something else that could be done. ?

## 2022-02-20 NOTE — Telephone Encounter (Signed)
Patients wife called regarding the message she sent in yesterday. Stated she has not received a call back. She needs guidance. Stated she needs to do a VV or come in and see [name] due to terrys decline ?

## 2022-02-20 NOTE — Telephone Encounter (Signed)
Called patients wife back and got more information.  They are able to get patient into the office for an appointment if he could be worked in. Following the bacterial infection the patient had from Sept until March he has had a marked decline. They are taking the entacapone and they are taking 3 /2/2/2/2/2 every two hours. Wife feels like it is wearing off and not helping. Patient has fallen a number of times he is using his Walker at home but doesn't use it out he uses a cane. Patient has given up and appears to have lost hope. Patient has more swaying frozen feet and shuffling. Patients wife has changed urologists and feels [facility] did not help her husband but has made his parkinson's much worse due to the bacterial infection. Patients wife desperate for an appointment  ? ?

## 2022-02-21 DIAGNOSIS — H25013 Cortical age-related cataract, bilateral: Secondary | ICD-10-CM | POA: Diagnosis not present

## 2022-02-21 DIAGNOSIS — H25043 Posterior subcapsular polar age-related cataract, bilateral: Secondary | ICD-10-CM | POA: Diagnosis not present

## 2022-02-21 DIAGNOSIS — H04553 Acquired stenosis of bilateral nasolacrimal duct: Secondary | ICD-10-CM | POA: Diagnosis not present

## 2022-02-21 DIAGNOSIS — H2513 Age-related nuclear cataract, bilateral: Secondary | ICD-10-CM | POA: Diagnosis not present

## 2022-02-26 DIAGNOSIS — N3000 Acute cystitis without hematuria: Secondary | ICD-10-CM | POA: Diagnosis not present

## 2022-02-27 ENCOUNTER — Telehealth: Payer: Self-pay | Admitting: Neurology

## 2022-02-27 ENCOUNTER — Other Ambulatory Visit: Payer: Self-pay | Admitting: Neurology

## 2022-02-27 ENCOUNTER — Telehealth: Payer: Self-pay | Admitting: Internal Medicine

## 2022-02-27 DIAGNOSIS — R531 Weakness: Secondary | ICD-10-CM | POA: Diagnosis not present

## 2022-02-27 DIAGNOSIS — G20 Parkinson's disease: Secondary | ICD-10-CM | POA: Diagnosis not present

## 2022-02-27 DIAGNOSIS — N390 Urinary tract infection, site not specified: Secondary | ICD-10-CM | POA: Diagnosis not present

## 2022-02-27 DIAGNOSIS — N1831 Chronic kidney disease, stage 3a: Secondary | ICD-10-CM | POA: Diagnosis not present

## 2022-02-27 NOTE — Telephone Encounter (Signed)
He had a bacterial infection a while back. The wife thinks the infection is back. He has fallen 6 times, even with walker. She is taking him to doctor in a bit, she doesn't know what they will do. He has an appt 5/18, she stated he really needs to see tat. She wants to know if they admit him, can they do a VV. She will call us back in the morning to update [place]. ?

## 2022-02-27 NOTE — Telephone Encounter (Signed)
Spoke with patient, relayed appointment time and location.  ? ?Beryle Flock, RN ? ?

## 2022-02-28 ENCOUNTER — Ambulatory Visit: Payer: Medicare Other | Admitting: Internal Medicine

## 2022-02-28 ENCOUNTER — Other Ambulatory Visit: Payer: Self-pay

## 2022-02-28 ENCOUNTER — Encounter: Payer: Self-pay | Admitting: Internal Medicine

## 2022-02-28 VITALS — BP 143/73 | HR 64 | Temp 97.7°F | Ht 72.0 in | Wt 202.0 lb

## 2022-02-28 DIAGNOSIS — G20 Parkinson's disease: Secondary | ICD-10-CM | POA: Diagnosis not present

## 2022-02-28 DIAGNOSIS — N390 Urinary tract infection, site not specified: Secondary | ICD-10-CM

## 2022-02-28 MED ORDER — FOSFOMYCIN TROMETHAMINE 3 G PO PACK: 3.0000 g | PACK | ORAL | 3 refills | Status: DC

## 2022-02-28 NOTE — Progress Notes (Deleted)
Assessment/Plan:   1.  Parkinsons Disease, diagnosed 2010 with symptoms to 2008  - ***carbidopa/levodopa 25/100, 3 at 7 to 8 AM, 2 tablets at 10 AM, 2 tablets at 12 PM, 2 tablets at 2 PM, 2 tablets at 4 PM, 2 tablets at 6 PM  -*** entacapone 200 mg with first three dosages of levodopa  -For now, we will continue ropinirole 3 mg, 1/1/0.5  -Continue amantadine, 100 mg 3 times per day.  May decrease this in the future, as he is using it for tremor (and he really does not have any), and this medication generally does not work well for tremor  -Discussed with patient and wife that I do not think it is going to be valuable for me to continue to change and increase his Parkinson's medication.  I have done this the last several visits, the most recent at the end of March.  They have not found this to be valuable and he has continued to have falls.  I strongly believe that this is because he is dealing with other medical illnesses, some of which have required hospitalizations.  This will certainly cause Parkinson symptoms to come out and cause more falls.  No amount of changing Parkinson's medication is going to help that until he is able to get healthy.  In addition, when he goes out of the house he will leave his walker at home, which makes things very difficult and causes more falls.  2.  Significant lower extremity edema with 25 pound weight gain over the last 2 months  -I talked with patient's cardiologist and he is going to get the patient in for an appointment.  -Patient wanted to add more prednisone (he thought that it would help, as he thinks that it is from inflammation) and I discussed with him that this would only make things worse.  3.  UTI worsening parkinsonism  -Following with infectious disease and currently changing treatment.  Greatly appreciate ID input and coordination of care.   Subjective:   [name] was seen today in follow up for Parkinsons disease.  My previous records  were reviewed prior to todays visit as well as outside records available to me.  Patient worked in today at the request of his wife.  Patient last seen at the very end of March.  I have increased his levodopa the last several visits and added entacapone, but they have continued to call and stated that he is falling.  However, the patient has had multiple medical illnesses (some requiring hospitalizations).  Wife wanted him worked in today as she thought it was the Parkinson's worsening as opposed to medical illness causing Parkinson symptoms.  However, she called 2 days ago stating that she did not think that this was necessarily the case any longer.  In fact, he had an appointment with infectious disease yesterday afternoon because the falls became extreme and she thought that he had infection.  I spoke to the ID physician before the appointment via secure chat, and reviewed the records today.  Patient has consistently grown kleb pna esbl and his current treatment of ceftriaxone and nitrofurantoin were not sensitive to this bacteria.  They are working on changing that and repeating the culture.    Current prescribed movement disorder medications: carbidopa/levodopa 25/100, 3 at 7 to 8 AM, 2 tablets at 10 AM, 2 tablets at 12 PM, 2 tablets at 2 PM, 2 tablets at 4 PM, 2 tablets at 6 PM (increased again  March 28) Entacapone, 200 mg with first 3 dosages of levodopa Amantadine 1 tablet tid (on it for tremor and not dyskinesia) requip 3 mg, 1 tablet in the morning, 1 tablet in the morning, 1 in the afternoon, half tablet in the evening (slight decrease last time)   PREVIOUS MEDICATIONS: neupro (helped but costly); requip; levodopa IR  ALLERGIES:   Allergies  Allergen Reactions   Oxycodone Itching    This makes him itch   Sudafed [Pseudoephedrine Hcl]     Problems urinating     CURRENT MEDICATIONS:  Outpatient Encounter Medications as of 03/01/2022  Medication Sig   acetaminophen (TYLENOL) 500 MG  tablet Take 500 mg by mouth every 6 (six) hours as needed for fever.   amantadine (SYMMETREL) 100 MG capsule TAKE 1 CAPSULE BY MOUTH THREE TIMES A DAY (Patient taking differently: Take 100 mg by mouth 3 (three) times daily.)   carbidopa-levodopa (SINEMET IR) 25-100 MG tablet TAKE 3 TABLETS BY MOUTH AT 7:00 AM, 2 TABLETS AT 10:00 AM, 2 TABLETS AT 12:00 PM, AND 2 TABLETS AT 2:00 PM TAKE 2 TABLETS AT 4:00 TAKE 2 TABLETS AT 6:00 PM   cholecalciferol (VITAMIN D) 1000 UNITS tablet Take 2,000 Units by mouth daily.   entacapone (COMTAN) 200 MG tablet Take 1 tablet (200 mg total) by mouth 3 (three) times daily. 1 tablet with first THREE dosages of levodopa (Patient taking differently: Take 200 mg by mouth 3 (three) times daily.)   furosemide (LASIX) 20 MG tablet Take 3 tablets (60 mg total) by mouth 2 (two) times daily.   GLOBAL EASE INJECT PEN NEEDLES 31G X 5 MM MISC Inject into the skin.   LANTUS SOLOSTAR 100 UNIT/ML Solostar Pen 15 Units at bedtime.   lisinopril (ZESTRIL) 5 MG tablet Take 5 mg by mouth daily.   Loteprednol Etabonate (LOTEMAX SM) 0.38 % GEL Place 1 drop into both eyes 2 (two) times daily.   metFORMIN (GLUCOPHAGE) 1000 MG tablet Take 1,000 mg by mouth 2 (two) times daily with a meal.    mirtazapine (REMERON) 7.5 MG tablet Take 1 tablet (7.5 mg total) by mouth at bedtime.   naproxen sodium (ALEVE) 220 MG tablet Take 440 mg by mouth daily as needed (fever).   omeprazole (PRILOSEC) 20 MG capsule Take 20 mg by mouth 2 (two) times daily before a meal.   potassium chloride SA (KLOR-CON) 20 MEQ tablet Take 20 mEq by mouth daily.   rOPINIRole (REQUIP) 3 MG tablet Take 1 tablet in the morning, Take  1 in the afternoon, Take a  half tablet in the evening   Saw Palmetto 500 MG CAPS Take 1 capsule by mouth daily.   simvastatin (ZOCOR) 40 MG tablet Take 40 mg by mouth every evening.   tamsulosin (FLOMAX) 0.4 MG CAPS capsule Take 0.8 mg by mouth daily.   triamcinolone (NASACORT) 55 MCG/ACT AERO nasal  inhaler Place 2 sprays into the nose daily.   vitamin B-12 (CYANOCOBALAMIN) 1000 MCG tablet Take 1,000 mcg by mouth daily.   No facility-administered encounter medications on file as of 03/01/2022.    Objective:   PHYSICAL EXAMINATION:    VITALS:   There were no vitals filed for this visit.   Wt Readings from Last 3 Encounters:  01/09/22 192 lb (87.1 kg)  01/04/22 198 lb 4.8 oz (89.9 kg)  08/29/21 204 lb 11.2 oz (92.9 kg)    GEN:  The patient appears stated age and is in NAD. HEENT:  Normocephalic, atraumatic.  The mucous  membranes are moist. The superficial temporal arteries are without ropiness or tenderness. CV:  RRR Lungs:  CTAB Neck/HEME:  There are no carotid bruits bilaterally. Exts:  pt has significant LE edema  Neurological examination:  Orientation: The patient is alert and oriented x3. Cranial nerves: There is good facial symmetry with min facial hypomimia. The speech is fluent and clear. Soft palate rises symmetrically and there is no tongue deviation. Hearing is intact to conversational tone. Sensation: Sensation is intact to light touch throughout Motor: Strength is at least antigravity x4.  Movement examination: Tone: There is nl tone in the UE/LE. Abnormal movements: none seen today Coordination:  There is min decremation with finger taps bilaterally. Gait and Station: The patient has no difficulty arising out of a deep-seated chair without the use of the hands.  He has some mild start hesitation and then walks fairly well in the hallway, with a few shorter steps.  I have reviewed and interpreted the following labs independently    Chemistry      Component Value Date/Time   NA 136 01/02/2022 0334   NA 139 07/24/2021 1107   K 3.4 (L) 01/02/2022 0334   CL 102 01/02/2022 0334   CO2 27 01/02/2022 0334   BUN 24 (H) 01/02/2022 0334   BUN 23 07/24/2021 1107   CREATININE 1.18 01/02/2022 0334      Component Value Date/Time   CALCIUM 8.3 (L) 01/02/2022 0334    ALKPHOS 89 01/01/2022 0346   AST 37 01/01/2022 0346   ALT <5 01/01/2022 0346   BILITOT 0.5 01/01/2022 0346       Lab Results  Component Value Date   WBC 7.4 01/02/2022   HGB 13.6 01/02/2022   HCT 42.4 01/02/2022   MCV 90.6 01/02/2022   PLT 201 01/02/2022    No results found for: TSH   Total time spent on today's visit was *** minutes, including both face-to-face time and nonface-to-face time.  Time included that spent on review of records (prior notes available to me/labs/imaging if pertinent), discussing treatment and goals, answering patient's questions and coordinating care.  Cc:  Josetta Huddle, MD

## 2022-02-28 NOTE — Patient Instructions (Signed)
Stop nitrofurantoin and ceftriaxone (rocephin) ? ? ?Start fosfomycin packet every other day for 3 doses, then once a week there after for prophylaxis ? ? ?See me in  1-2 weeks to make sure symptoms better. If not we might need to see if IV antibiotics is needed ? ? ? ?

## 2022-02-28 NOTE — Progress Notes (Signed)
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   Abnormal involuntary movement 02/18/2018   Encounter for general adult medical examination with abnormal findings 02/18/2018   Enlarged prostate 02/18/2018   Hearing loss 02/18/2018   Other long term (current) drug therapy 02/18/2018   Type 2 diabetes mellitus with hyperglycemia ([place]) 02/18/2018   Reducible left inguinal hernia 11/03/2015   HLD (hyperlipidemia)    Nephrolithiasis    Parkinson's disease ([place])    Diabetes mellitus with coincident hypertension ([place])    Syncope    Renal calculus    Diabetes ([place])    Allergic rhinitis    BPH (benign prostatic hyperplasia)    Erectile dysfunction    Dysphagia, pharyngeal phase    Edema    Presbycusis    Onychomycosis of toenail    Ureteral calculus 02/09/2013   LEG PAIN 03/14/2010      HPI: [name] is a 76 y.o. 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 Patient's wife dropped off a urine sample a few days prior to this visit of 02/28/22 asking urology to evaluate as patient has become weaker with more falls concerning for a uti 2) his neurologist has  been adjusting parkinson's meds but is wondering if it is due to secondary causes making his parkinsonism temporarily worse rather than primary progression of his parkinsonism  She last saw his urologist on 01/03/22 during admission 3/19-24 for esbl uti. He had 4 days progressive malaise/generalized weakness and was admitted to get meropnem --> ertapenem for 2 weeks. On follow up 3/22 he was feeling better.  He is currently on nitrofurantoin for prophylaxis, since 5/16. He is getting ceftriaxone as well at the [place] physician's at [facility].    I reviewed his previous urine culture on epic (last one I could see was 12/31/21) 12/31/21 kleb pna pan-resistant except imipenem, but only 20k colonies 06/2021 citrobacter freundii (S cipro/bactrim/gentamycin/nitrofurantoin) and kleb pna (S imipenem but resistant to cipro/bactrim)   Care everywhere labs: 01/31/22 ucx >100k colonies/ml of kleb pna esbl (R cipro, bactrim; s ertapenem)   He has had about 3-4 episodes of these increased parkinsonism the last 2 years and all got better with abx treatment. He doesn't take over the counter medication intermittently like benadryl that could worsen urinary retention  He denies other sx of typical uti outside of the weakness/malaise and increased parkinsonism (which is mainly more falls and shuffling gait and unsteady). He hasn't had a time of these episode where he clinically watch and improve  He mentions over the last 2 years his parkinson also gets gradually worse  Since his last episode 12/31/21 uti, he had 4-5 good week baseline  sx and the last several days have had worsening parkinsonism and weakness/malaise.   He is using a fww now which is very bad for him.  He hasn't felt better since nitrofurantoin and ceftriaxone yet.   He has not had no prostate sling procedure He last had a foley catheter in 12/2021  Normally he hasn't had to straight cath himself    Meds: Sinemet 25-100 3 tablet in  morning, and 2 tablet q2hours Entacapone 200 mg tid ropinarole Lantus Acetaminophen Nitrofurantoin 5/16-c Metformin Lisinopril Omeprazole Tamsulosin Furosemide Simvastatin    Review of Systems: ROS All other ros negative      Past Medical History:  Diagnosis Date   Allergic rhinitis    Arthritis    BPH (benign prostatic hyperplasia)    Diabetes ([place])    Dysphagia, pharyngeal phase    GERD diagnosed on barium swallow. Has small hiatal hernia. Symptomatically somewhat better on omeprazole but not entirely. We'll try b.i.d. therapy   Edema    1+ in both ankles, likely multifactorial including medication such as Requip   Erectile dysfunction    Staxyn 10 mg or Viagra worked well. 3 samples of Cialis 20 mg provided   GERD (gastroesophageal reflux disease)    Hypercholesteremia    Hypertension    Nephrolithiasis    Onychomycosis of toenail    April 27, 2013 - Dr. Inocencio Homes - podiatry, was in [place] - treating with oral Lamisil and topical nail therapy   Parkinson's disease [facility])     followed by Dr. Lezlie Octave at [facility] and [name], M.D. in [facility]   Presbycusis    and tinnitus - Dr. Izora Gala - August/2013   Renal calculus    Syncope     Social History   Tobacco Use   Smoking status: Never   Smokeless tobacco: Never  Vaping Use   Vaping Use: Never used  Substance Use Topics   Alcohol use: No   Drug use: No    Family History  Problem Relation Age of Onset   Atrial fibrillation Mother    Breast cancer Mother    Emphysema Father    Heart disease Father    Cancer Brother        [place]    Allergies  Allergen Reactions   Oxycodone Itching    This makes him itch   Sudafed [Pseudoephedrine Hcl]     Problems urinating     OBJECTIVE: Vitals:   02/28/22 1611  BP: (!) 143/73  Pulse: 64  Temp: 97.7 F (36.5 C)  TempSrc: Oral  SpO2: 96%  Weight: 202 lb (91.6 kg)  Height: 6' (1.829 m)   Body mass index is 27.4  kg/m.   Physical Exam General/constitutional: no distress, pleasant HEENT: Normocephalic, PER, Conj Clear, EOMI, Oropharynx clear Neck supple CV: rrr no mrg Lungs: clear to auscultation, normal respiratory effort Abd: Soft, Nontender Ext: no edema Skin: No Rash Neuro: normal finger to nose; no resting tremor; noted shuffling small step gait with fww MSK: no peripheral joint swelling/tenderness/warmth; back spines nontender     Lab: Lab Results  Component Value Date   WBC 7.4 01/02/2022   HGB 13.6 01/02/2022   HCT 42.4 01/02/2022   MCV 90.6 01/02/2022   PLT 201 50/53/9767   Last metabolic panel Lab Results  Component Value Date   GLUCOSE 156 (H) 01/02/2022   NA 136 01/02/2022   K 3.4 (L) 01/02/2022   CL 102 01/02/2022   CO2 27 01/02/2022  BUN 24 (H) 01/02/2022   CREATININE 1.18 01/02/2022   GFRNONAA >60 01/02/2022   CALCIUM 8.3 (L) 01/02/2022   PROT 5.9 (L) 01/01/2022   ALBUMIN 2.7 (L) 01/01/2022   BILITOT 0.5 01/01/2022   ALKPHOS 89 01/01/2022   AST 37 01/01/2022   ALT <5 01/01/2022   ANIONGAP 7 01/02/2022    Microbiology:  Serology:  Imaging:   Assessment/plan: Problem List Items Addressed This Visit   None Visit Diagnoses     Recurrent UTI    -  Primary   Relevant Medications   nitrofurantoin, macrocrystal-monohydrate, (MACROBID) 100 MG capsule   Other Relevant Orders   Urinalysis, Routine w reflex microscopic   Urine Culture   Parkinson disease ([place])       Relevant Orders   Urinalysis, Routine w reflex microscopic   Urine Culture       Discuss concept of assymptomatic bacteriuria and uti  His symptoms although atypical could be consistent with episodes of uti worsening parkinsonism. There is no intermittent offending medications that triggers these symptoms  He has consistently grown kleb pna esbl. His current ceftriaxone and nitrofurantoin are not sensitive to this bacteria  We could try fosfamycin now and if doesn't work, will  repeat culture and see if carbapenem is needed    His story suggest that over the last couple years his parkinsonism is slowing progressive but clearly these episodes of presumed uti had demonstrated reversible brief duration of worsening parkinonism      Follow-up: Return in about 2 weeks (around 03/14/2022).   I have spent a total of 80 minutes of face-to-face and non-face-to-face time, excluding clinical staff time, preparing to see patient, ordering tests and/or medications, and provide counseling the patient    Jabier Mutton, [facility] 02/28/2022, 4:25 PM

## 2022-03-01 ENCOUNTER — Inpatient Hospital Stay (HOSPITAL_COMMUNITY)
Admission: EM | Admit: 2022-03-01 | Discharge: 2022-03-07 | DRG: 690 | Disposition: A | Discharge status: Home Health | Payer: Medicare Other | Attending: Internal Medicine | Admitting: Internal Medicine

## 2022-03-01 ENCOUNTER — Telehealth: Payer: Self-pay

## 2022-03-01 ENCOUNTER — Other Ambulatory Visit (HOSPITAL_COMMUNITY): Payer: Self-pay

## 2022-03-01 ENCOUNTER — Ambulatory Visit: Payer: Medicare Other | Admitting: Neurology

## 2022-03-01 ENCOUNTER — Telehealth: Payer: Self-pay | Admitting: Neurology

## 2022-03-01 ENCOUNTER — Encounter (HOSPITAL_COMMUNITY): Payer: Self-pay | Admitting: Oncology

## 2022-03-01 ENCOUNTER — Emergency Department (HOSPITAL_COMMUNITY): Payer: Medicare Other

## 2022-03-01 ENCOUNTER — Other Ambulatory Visit: Payer: Self-pay

## 2022-03-01 DIAGNOSIS — E119 Type 2 diabetes mellitus without complications: Secondary | ICD-10-CM | POA: Diagnosis not present

## 2022-03-01 DIAGNOSIS — E7800 Pure hypercholesterolemia, unspecified: Secondary | ICD-10-CM | POA: Diagnosis not present

## 2022-03-01 DIAGNOSIS — A419 Sepsis, unspecified organism: Secondary | ICD-10-CM | POA: Diagnosis not present

## 2022-03-01 DIAGNOSIS — N400 Enlarged prostate without lower urinary tract symptoms: Secondary | ICD-10-CM | POA: Diagnosis present

## 2022-03-01 DIAGNOSIS — G20A1 Parkinson's disease without dyskinesia, without mention of fluctuations: Secondary | ICD-10-CM | POA: Diagnosis present

## 2022-03-01 DIAGNOSIS — I110 Hypertensive heart disease with heart failure: Secondary | ICD-10-CM | POA: Diagnosis not present

## 2022-03-01 DIAGNOSIS — N319 Neuromuscular dysfunction of bladder, unspecified: Secondary | ICD-10-CM | POA: Diagnosis not present

## 2022-03-01 DIAGNOSIS — Z8249 Family history of ischemic heart disease and other diseases of the circulatory system: Secondary | ICD-10-CM

## 2022-03-01 DIAGNOSIS — Z1612 Extended spectrum beta lactamase (ESBL) resistance: Secondary | ICD-10-CM | POA: Diagnosis present

## 2022-03-01 DIAGNOSIS — Z7984 Long term (current) use of oral hypoglycemic drugs: Secondary | ICD-10-CM | POA: Diagnosis not present

## 2022-03-01 DIAGNOSIS — N3000 Acute cystitis without hematuria: Secondary | ICD-10-CM | POA: Diagnosis not present

## 2022-03-01 DIAGNOSIS — Z79899 Other long term (current) drug therapy: Secondary | ICD-10-CM | POA: Diagnosis not present

## 2022-03-01 DIAGNOSIS — R627 Adult failure to thrive: Secondary | ICD-10-CM | POA: Diagnosis not present

## 2022-03-01 DIAGNOSIS — R531 Weakness: Principal | ICD-10-CM

## 2022-03-01 DIAGNOSIS — N390 Urinary tract infection, site not specified: Secondary | ICD-10-CM | POA: Diagnosis not present

## 2022-03-01 DIAGNOSIS — Z794 Long term (current) use of insulin: Secondary | ICD-10-CM | POA: Diagnosis not present

## 2022-03-01 DIAGNOSIS — I5032 Chronic diastolic (congestive) heart failure: Secondary | ICD-10-CM | POA: Diagnosis not present

## 2022-03-01 DIAGNOSIS — G20 Parkinson's disease: Secondary | ICD-10-CM | POA: Diagnosis not present

## 2022-03-01 DIAGNOSIS — Z87440 Personal history of urinary (tract) infections: Secondary | ICD-10-CM

## 2022-03-01 DIAGNOSIS — B9620 Unspecified Escherichia coli [E. coli] as the cause of diseases classified elsewhere: Secondary | ICD-10-CM | POA: Diagnosis present

## 2022-03-01 DIAGNOSIS — Z825 Family history of asthma and other chronic lower respiratory diseases: Secondary | ICD-10-CM

## 2022-03-01 DIAGNOSIS — E785 Hyperlipidemia, unspecified: Secondary | ICD-10-CM | POA: Diagnosis present

## 2022-03-01 DIAGNOSIS — K219 Gastro-esophageal reflux disease without esophagitis: Secondary | ICD-10-CM | POA: Diagnosis not present

## 2022-03-01 LAB — PROTIME-INR
INR: 1.1 (ref 0.8–1.2)
Prothrombin Time: 14 seconds (ref 11.4–15.2)

## 2022-03-01 LAB — URINALYSIS, ROUTINE W REFLEX MICROSCOPIC
Bilirubin Urine: NEGATIVE
Glucose, UA: NEGATIVE mg/dL
Ketones, ur: 20 mg/dL — AB
Nitrite: NEGATIVE
Protein, ur: 30 mg/dL — AB
Specific Gravity, Urine: 1.016 (ref 1.005–1.030)
WBC, UA: >50 WBC/hpf — ABNORMAL HIGH (ref 0–5)
pH: 6 (ref 5.0–8.0)

## 2022-03-01 LAB — COMPREHENSIVE METABOLIC PANEL
ALT: <5 U/L (ref 0–44)
AST: 14 U/L — ABNORMAL LOW (ref 15–41)
Albumin: 3.5 g/dL (ref 3.5–5.0)
Alkaline Phosphatase: 90 U/L (ref 38–126)
Anion gap: 10 (ref 5–15)
BUN: 16 mg/dL (ref 8–23)
CO2: 26 mmol/L (ref 22–32)
Calcium: 9.2 mg/dL (ref 8.9–10.3)
Chloride: 101 mmol/L (ref 98–111)
Creatinine, Ser: 1.24 mg/dL (ref 0.61–1.24)
GFR, Estimated: >60 mL/min (ref >60)
Glucose, Bld: 209 mg/dL — ABNORMAL HIGH (ref 70–99)
Potassium: 4.2 mmol/L (ref 3.5–5.1)
Sodium: 137 mmol/L (ref 135–145)
Total Bilirubin: 1 mg/dL (ref 0.3–1.2)
Total Protein: 7.3 g/dL (ref 6.5–8.1)

## 2022-03-01 LAB — CBC WITH DIFFERENTIAL/PLATELET
Abs Immature Granulocytes: 0.02 10*3/uL (ref 0.00–0.07)
Basophils Absolute: 0 10*3/uL (ref 0.0–0.1)
Basophils Relative: 0 %
Eosinophils Absolute: 0 10*3/uL (ref 0.0–0.5)
Eosinophils Relative: 0 %
HCT: 43.2 % (ref 39.0–52.0)
Hemoglobin: 13.9 g/dL (ref 13.0–17.0)
Immature Granulocytes: 0 %
Lymphocytes Relative: 6 %
Lymphs Abs: 0.6 10*3/uL — ABNORMAL LOW (ref 0.7–4.0)
MCH: 29.4 pg (ref 26.0–34.0)
MCHC: 32.2 g/dL (ref 30.0–36.0)
MCV: 91.5 fL (ref 80.0–100.0)
Monocytes Absolute: 0.8 10*3/uL (ref 0.1–1.0)
Monocytes Relative: 8 %
Neutro Abs: 8.2 10*3/uL — ABNORMAL HIGH (ref 1.7–7.7)
Neutrophils Relative %: 86 %
Platelets: 216 10*3/uL (ref 150–400)
RBC: 4.72 MIL/uL (ref 4.22–5.81)
RDW: 14.2 % (ref 11.5–15.5)
WBC: 9.6 10*3/uL (ref 4.0–10.5)
nRBC: 0 % (ref 0.0–0.2)

## 2022-03-01 LAB — GLUCOSE, CAPILLARY
Glucose-Capillary: 183 mg/dL — ABNORMAL HIGH (ref 70–99)
Glucose-Capillary: 197 mg/dL — ABNORMAL HIGH (ref 70–99)

## 2022-03-01 LAB — LACTIC ACID, PLASMA
Lactic Acid, Venous: 1.2 mmol/L (ref 0.5–1.9)
Lactic Acid, Venous: 1 mmol/L (ref 0.5–1.9)

## 2022-03-01 LAB — APTT: aPTT: 33 seconds (ref 24–36)

## 2022-03-01 MED ORDER — CARBIDOPA-LEVODOPA 25-100 MG PO TABS
3.0000 | ORAL_TABLET | Freq: Every morning | ORAL | Status: DC
  Administered 2022-03-02 – 2022-03-07 (×6): 3 via ORAL
  Filled 2022-03-01 (×6): qty 3

## 2022-03-01 MED ORDER — TAMSULOSIN HCL 0.4 MG PO CAPS
0.8000 mg | ORAL_CAPSULE | Freq: Every day | ORAL | Status: DC
Start: 2022-03-01 — End: 2022-03-07
  Administered 2022-03-01 – 2022-03-07 (×7): 0.8 mg via ORAL
  Filled 2022-03-01 (×7): qty 2

## 2022-03-01 MED ORDER — SODIUM CHLORIDE 0.9 % IV SOLN
1.0000 g | Freq: Once | INTRAVENOUS | Status: AC
  Administered 2022-03-01: 1 g via INTRAVENOUS
  Filled 2022-03-01: qty 1
  Filled 2022-03-01: qty 20

## 2022-03-01 MED ORDER — SODIUM CHLORIDE 0.9 % IV SOLN
INTRAVENOUS | Status: DC
Start: 2022-03-01 — End: 2022-03-03

## 2022-03-01 MED ORDER — INSULIN ASPART 100 UNIT/ML IJ SOLN
0.0000 [IU] | Freq: Every day | INTRAMUSCULAR | Status: DC
  Administered 2022-03-02: 2 [IU] via SUBCUTANEOUS
  Administered 2022-03-04 – 2022-03-06 (×2): 3 [IU] via SUBCUTANEOUS
  Filled 2022-03-01: qty 0.05

## 2022-03-01 MED ORDER — CARBIDOPA-LEVODOPA 25-100 MG PO TABS
2.0000 | ORAL_TABLET | ORAL | Status: DC
  Administered 2022-03-02 – 2022-03-07 (×26): 2 via ORAL
  Filled 2022-03-01 (×27): qty 2

## 2022-03-01 MED ORDER — ENTACAPONE 200 MG PO TABS
200.0000 mg | ORAL_TABLET | ORAL | Status: DC
Start: 2022-03-02 — End: 2022-03-07
  Administered 2022-03-02 – 2022-03-07 (×18): 200 mg via ORAL
  Filled 2022-03-01 (×18): qty 1

## 2022-03-01 MED ORDER — CARBIDOPA-LEVODOPA 25-100 MG PO TABS: 2.0000 | ORAL_TABLET | ORAL | Status: DC

## 2022-03-01 MED ORDER — SODIUM CHLORIDE 0.9 % IV SOLN
1.0000 g | Freq: Three times a day (TID) | INTRAVENOUS | Status: AC
  Administered 2022-03-02 – 2022-03-07 (×16): 1 g via INTRAVENOUS
  Filled 2022-03-01 (×13): qty 20
  Filled 2022-03-01: qty 1
  Filled 2022-03-01: qty 20
  Filled 2022-03-01: qty 1

## 2022-03-01 MED ORDER — VITAMIN D 25 MCG (1000 UNIT) PO TABS
2000.0000 [IU] | ORAL_TABLET | Freq: Every day | ORAL | Status: DC
  Administered 2022-03-01 – 2022-03-07 (×7): 2000 [IU] via ORAL
  Filled 2022-03-01 (×7): qty 2

## 2022-03-01 MED ORDER — ENOXAPARIN SODIUM 40 MG/0.4ML IJ SOSY
40.0000 mg | PREFILLED_SYRINGE | INTRAMUSCULAR | Status: DC
  Administered 2022-03-01 – 2022-03-06 (×6): 40 mg via SUBCUTANEOUS
  Filled 2022-03-01 (×6): qty 0.4

## 2022-03-01 MED ORDER — INSULIN ASPART 100 UNIT/ML IJ SOLN
0.0000 [IU] | Freq: Three times a day (TID) | INTRAMUSCULAR | Status: DC
  Administered 2022-03-01: 3 [IU] via SUBCUTANEOUS
  Administered 2022-03-02: 5 [IU] via SUBCUTANEOUS
  Administered 2022-03-02: 3 [IU] via SUBCUTANEOUS
  Administered 2022-03-02: 8 [IU] via SUBCUTANEOUS
  Administered 2022-03-03 (×2): 3 [IU] via SUBCUTANEOUS
  Administered 2022-03-03 – 2022-03-04 (×2): 5 [IU] via SUBCUTANEOUS
  Administered 2022-03-04: 2 [IU] via SUBCUTANEOUS
  Administered 2022-03-04: 3 [IU] via SUBCUTANEOUS
  Administered 2022-03-05: 8 [IU] via SUBCUTANEOUS
  Administered 2022-03-05: 3 [IU] via SUBCUTANEOUS
  Administered 2022-03-05: 2 [IU] via SUBCUTANEOUS
  Administered 2022-03-06 (×2): 3 [IU] via SUBCUTANEOUS
  Administered 2022-03-06: 8 [IU] via SUBCUTANEOUS
  Administered 2022-03-07: 5 [IU] via SUBCUTANEOUS
  Administered 2022-03-07: 3 [IU] via SUBCUTANEOUS
  Filled 2022-03-01: qty 0.15

## 2022-03-01 NOTE — ED Triage Notes (Signed)
Pt presents d/t suspected UTI. Per pt's wife pt hospitalized in March w/ ESBL. Seen by ID yesterday however did not start antibiotics. Pt reports weakness in legs, dark urine and low grade fevers.

## 2022-03-01 NOTE — ED Provider Notes (Signed)
[facility] DEPT Provider Note   CSN: [id number] Arrival date & time: 03/01/22  0915     History  Chief Complaint  Patient presents with   UTI Sx    [name] is a 76 y.o. male.  HPI Patient with past medical history of parkinsons disease, gerd, renal stone, BPH, dm on insulin, recurrent multidrug-resistant uti.  He was seen yesterday at infectious disease by Dr. Gale Journey.  Patient was being evaluated for worsening weakness and falls with concern for infection versus worsening Parkinson as the cause.  Patient is actively been treated with nitrofurantoin for prophylaxis and had empirically been getting ceftriaxone by his physician at [facility].  Typically patient has improved with antibiotic therapy.  Patient'Le wife reports that patient has been weaker over the past 3 to 4 days.  He has had several falls.  He has gotten a small abrasion over the top of his left shoulder and hit his head.  The patient denies he has any significant associated pain.  No vomiting.  No chest pain no shortness of breath.    Home Medications Prior to Admission medications   Medication Sig Start Date End Date Taking? Authorizing Provider  acetaminophen (TYLENOL) 500 MG tablet Take 500 mg by mouth every 6 (six) hours as needed for fever.    [provider]  amantadine (SYMMETREL) 100 MG capsule TAKE 1 CAPSULE BY MOUTH THREE TIMES A DAY Patient not taking: Reported on 02/28/2022 12/04/21   [name], Eric Quail, DO  carbidopa-levodopa (SINEMET IR) 25-100 MG tablet TAKE 3 TABLETS BY MOUTH AT 7:00 AM, 2 TABLETS AT 10:00 AM, 2 TABLETS AT 12:00 PM, AND 2 TABLETS AT 2:00 PM TAKE 2 TABLETS AT 4:00 TAKE 2 TABLETS AT 6:00 PM 01/31/22   [name], Eric Quail, DO  cholecalciferol (VITAMIN D) 1000 UNITS tablet Take 2,000 Units by mouth daily.    [provider]  entacapone (COMTAN) 200 MG tablet Take 1 tablet (200 mg total) by mouth 3 (three) times daily. 1 tablet with first THREE dosages of  levodopa Patient taking differently: Take 200 mg by mouth 3 (three) times daily. 06/23/21   [name], Eric Quail, DO  fosfomycin (MONUROL) 3 g PACK Take 3 g by mouth every other day for 3 doses. 02/28/22 03/05/22  Vu, [name], MD  furosemide (LASIX) 20 MG tablet Take 3 tablets (60 mg total) by mouth 2 (two) times daily. Patient not taking: Reported on 02/28/2022 07/24/21   Jerline Pain, MD  GLOBAL EASE INJECT PEN NEEDLES 31G X 5 MM MISC Inject into the skin. 05/09/21   [provider]  LANTUS SOLOSTAR 100 UNIT/ML Solostar Pen 15 Units at bedtime. 05/03/21   [provider]  lisinopril (ZESTRIL) 5 MG tablet Take 5 mg by mouth daily. 12/06/21   [provider]  Loteprednol Etabonate (LOTEMAX SM) 0.38 % GEL Place 1 drop into both eyes 2 (two) times daily. Patient not taking: Reported on 02/28/2022    [provider]  metFORMIN (GLUCOPHAGE) 1000 MG tablet Take 1,000 mg by mouth 2 (two) times daily with a meal.     [provider]  mirtazapine (REMERON) 7.5 MG tablet Take 1 tablet (7.5 mg total) by mouth at bedtime. Patient not taking: Reported on 02/28/2022 01/05/22   Aline August, MD  naproxen sodium (ALEVE) 220 MG tablet Take 440 mg by mouth daily as needed (fever).    [provider]  omeprazole (PRILOSEC) 20 MG capsule Take 20 mg by mouth  2 (two) times daily before a meal.    [provider]  potassium chloride SA (KLOR-CON) 20 MEQ tablet Take 20 mEq by mouth daily. 06/16/21   [provider]  rOPINIRole (REQUIP) 3 MG tablet Take 1 tablet in the morning, Take  1 in the afternoon, Take a  half tablet in the evening 02/27/22   [name], Eric S, DO  Saw Palmetto 500 MG CAPS Take 1 capsule by mouth daily. Patient not taking: Reported on 02/28/2022    [provider]  simvastatin (ZOCOR) 40 MG tablet Take 40 mg by mouth every evening. Patient not taking: Reported on 02/28/2022    [provider]  tamsulosin (FLOMAX) 0.4 MG CAPS  capsule Take 0.8 mg by mouth daily. 01/24/18   [provider]  triamcinolone (NASACORT) 55 MCG/ACT AERO nasal inhaler Place 2 sprays into the nose daily.    [provider]  vitamin B-12 (CYANOCOBALAMIN) 1000 MCG tablet Take 1,000 mcg by mouth daily.    [provider]      Allergies    Oxycodone and Sudafed [pseudoephedrine hcl]    Review of Systems   Review of Systems 10 systems reviewed and negative except as per HPI Physical Exam Updated Vital Signs BP (!) 150/70   Pulse 84   Temp 98.6 F (37 C) (Oral)   Resp (!) 21   Ht 6' (1.829 m)   Wt 86.6 kg   SpO2 97%   BMI 25.90 kg/m  Physical Exam Constitutional:      Comments: Patient is alert and nontoxic.  No respiratory distress at rest.  Well-nourished well-developed.  HENT:     Head:     Comments: Resolving hematoma to the top of the head.  No associated abrasion or skin disruption.  Minor.    Nose: Nose normal.     Mouth/Throat:     Mouth: Mucous membranes are moist.     Pharynx: Oropharynx is clear.  Eyes:     Extraocular Movements: Extraocular movements intact.     Pupils: Pupils are equal, round, and reactive to light.  Neck:     Comments: No midline C-spine tenderness. Cardiovascular:     Rate and Rhythm: Normal rate and regular rhythm.  Pulmonary:     Effort: Pulmonary effort is normal.     Breath sounds: Normal breath sounds.  Abdominal:     General: There is no distension.     Palpations: Abdomen is soft.     Tenderness: There is no abdominal tenderness. There is no guarding.     Comments: No suprapubic tenderness or fullness to palpation.  Musculoskeletal:        General: No swelling or tenderness. Normal range of motion.     Right lower leg: No edema.     Left lower leg: No edema.     Comments: Legs are in good condition.  There is no erythema or cellulitis.  No edema.  Feet and legs examined normal.  Skin:    General: Skin is warm and dry.  Neurological:     Comments:  Patient is slightly slowed and has decreased facial expression consistent with Parkinson'Le.  No focal deficits.  Patient is verbally interacting and appropriate.  Patient was not ambulated for gait.    ED Results / Procedures / Treatments   Labs (all labs ordered are listed, but only abnormal results are displayed) Labs Reviewed  COMPREHENSIVE METABOLIC PANEL - Abnormal; Notable for the following components:  Result Value   Glucose, Bld 209 (*)    AST 14 (*)    All other components within normal limits  CBC WITH DIFFERENTIAL/PLATELET - Abnormal; Notable for the following components:   Neutro Abs 8.2 (*)    Lymphs Abs 0.6 (*)    All other components within normal limits  URINALYSIS, ROUTINE W REFLEX MICROSCOPIC - Abnormal; Notable for the following components:   APPearance HAZY (*)    Hgb urine dipstick SMALL (*)    Ketones, ur 20 (*)    Protein, ur 30 (*)    Leukocytes,Ua LARGE (*)    WBC, UA >50 (*)    Bacteria, UA RARE (*)    All other components within normal limits  CULTURE, BLOOD (ROUTINE X 2)  CULTURE, BLOOD (ROUTINE X 2)  URINE CULTURE  LACTIC ACID, PLASMA  PROTIME-INR  APTT  LACTIC ACID, PLASMA    EKG EKG Interpretation  Date/Time:  Thursday Mar 01 2022 11:56:13 EDT Ventricular Rate:  77 PR Interval:  123 QRS Duration: 119 QT Interval:  426 QTC Calculation: 389 R Axis:   -37 Text Interpretation: Sinus rhythm Ventricular bigeminy Incomplete left bundle branch block agree, no sig change from previous Confirmed by Charlesetta Shanks [phone]) on 03/01/2022 12:54:14 PM  Radiology CT Head Wo Contrast  Result Date: 03/01/2022 CLINICAL DATA:  Mental status change. EXAM: CT HEAD WITHOUT CONTRAST TECHNIQUE: Contiguous axial images were obtained from the base of the skull through the vertex without intravenous contrast. RADIATION DOSE REDUCTION: This exam was performed according to the departmental dose-optimization program which includes automated exposure control,  adjustment of the mA and/or kV according to patient size and/or use of iterative reconstruction technique. COMPARISON:  No recent examination. MRI examination dated October 03, 2009 FINDINGS: Brain: No evidence of acute infarction, hemorrhage, hydrocephalus, extra-axial collection or mass lesion/mass effect. Mild cerebral atrophy and chronic microvascular ischemic changes of the white matter. Vascular: No hyperdense vessel or unexpected calcification. Skull: Normal. Negative for fracture or focal lesion. Sinuses/Orbits: No acute finding. Other: None. IMPRESSION: 1.  No acute intracranial abnormality. 2. Mild cerebral atrophy and chronic microvascular ischemic changes of the white matter. Electronically Signed   By: Keane Police D.O.   On: 03/01/2022 12:40   DG Chest Port 1 View  Result Date: 03/01/2022 CLINICAL DATA:  Sepsis EXAM: PORTABLE CHEST 1 VIEW COMPARISON:  None Available. FINDINGS: The heart size and mediastinal contours are within normal limits. Both lungs are clear. The visualized skeletal structures are unremarkable. IMPRESSION: No active disease. Electronically Signed   By: Kathreen Devoid M.D.   On: 03/01/2022 11:03    Procedures Procedures    Medications Ordered in ED Medications - No data to display  ED Course/ Medical Decision Making/ A&P                           Medical Decision Making Amount and/or Complexity of Data Reviewed Labs: ordered. Radiology: ordered. ECG/medicine tests: ordered.  Risk Decision regarding hospitalization.  Patient presents with worsening weakness and mental status change.  Patient has history of Parkinson'Le disease.  He is cared for in his home by his wife.  She reports that as the symptoms progressed oftentimes it is associated with UTI.  Patient had been seen by infectious disease, Dr. Gale Journey yesterday.  At that time plan was to try to start oral fosfomycin.  Patient'Le wife however reports he has gotten worse and he was too weak to get up  off the  floor last night.  Results show large amount of white cells in the urine and leukoesterase positive.  Patient has a culture in process from yesterday.  Prior cultures show sensitivity to meropenem.  With declining condition and severe comorbid illness, patient will be admitted for IV antibiotics.  Respiratory status is stable.  Vital signs are stable.  Consult: Reviewed with Dr.Vu infectious disease.  Advises if patient is clinically in need of admission, can treat with meropenem as this was the only susceptible antibiotic previously Consult: Reviewed with Dr. Zigmund Daniel [facility] hospitalist for admission.        Final Clinical Impression(Le) / ED Diagnoses Final diagnoses:  Generalized weakness  Parkinson'Le disease ([place])  Acute cystitis without hematuria    Rx / DC Orders ED Discharge Orders     None         Charlesetta Shanks, MD 03/02/22 [phone]

## 2022-03-01 NOTE — Telephone Encounter (Signed)
[facility] Patient Advocate Encounter  Prior Authorization for Fosfomycin has been approved.    PA# [id number] Effective dates: 02/12/22 through 10/14/22  Patients co-pay is $100.00.   Patient can use a [employer] copay card and get it cheaper if they use a [place] $52.48.   [facility] will continue to follow.  Ileene Patrick, [facility] Patient [facility] for [facility] Disease Phone: [phone] Fax:  [phone]

## 2022-03-01 NOTE — Telephone Encounter (Signed)
[facility] Patient Advocate Encounter   Received notification from [facility] that prior authorization for Fosfomycin is required.   PA submitted on 03/01/22 Key [id number] Status is pending    [facility] will continue to follow.   Ileene Patrick, [facility] Patient [facility]ious [facility] Phone: [phone] Fax:  [phone]

## 2022-03-01 NOTE — Progress Notes (Signed)
Pharmacy Antibiotic Note  KENTRAIL SHEW is a 76 y.o. male admitted on 03/01/2022 with UTI.  Pharmacy has been consulted for meropenem dosing.  Pt with a history of recurrent UTI including ESBL UTI in March 2023 treated with 2 weeks of ertapenem.   Plan: - Meropenem 1 gr IV q8h  - Monitor clinical course, renal function, cultures as available  Height: 6' (182.9 cm) Weight: 86.6 kg (191 lb) IBW/kg (Calculated) : 77.6  Temp (24hrs), Avg:98.5 F (36.9 C), Min:98.4 F (36.9 C), Max:98.6 F (37 C)  Recent Labs  Lab 03/01/22 1131 03/01/22 1341  WBC 9.6  --   CREATININE 1.24  --   LATICACIDVEN 1.2 1.0    Estimated Creatinine Clearance: 56.5 mL/min (by C-G formula based on SCr of 1.24 mg/dL).    Allergies  Allergen Reactions   Oxycodone Itching    This makes him itch   Sudafed [Pseudoephedrine Hcl]     Problems urinating     Antimicrobials this admission:  5/18 meropenem >>     Dose adjustments this admission:    Microbiology results:  5/17 UCx:  5/18 BCx:  5/18 UCx:      Thank you for allowing pharmacy to be a part of this patient's care.  Royetta Asal, PharmD, BCPS 03/01/2022 6:06 PM

## 2022-03-01 NOTE — ED Notes (Signed)
Informed pt we needed a urine gave pt a urinal

## 2022-03-01 NOTE — Plan of Care (Signed)

## 2022-03-01 NOTE — Telephone Encounter (Signed)
Patient's wife called wanting to update Dr. Gale Journey and let him know that Demarie has gone to the emergency department as his condition worsened last night.   Beryle Flock, RN

## 2022-03-01 NOTE — ED Notes (Signed)
Urine cup provided.

## 2022-03-01 NOTE — Telephone Encounter (Signed)
Patients wife called, [name] will not be at today's appt. They are headed to the ER. He has completely shut down. Cant walk, cant do anything. He is running a fever. She would like tat to check up on him during or after the ER visit.

## 2022-03-01 NOTE — Progress Notes (Signed)
Pharmacy Note   A consult was received from an ED physician for meopenem per pharmacy dosing.    The patient's profile has been reviewed for ht/wt/allergies/indication/available labs.    A one time order has been placed for meropenem 1 gr IV x1 .    Further antibiotics/pharmacy consults should be ordered by admitting physician if indicated.                       Thank you,  Royetta Asal, PharmD, BCPS 03/01/2022 3:32 PM

## 2022-03-01 NOTE — Progress Notes (Signed)
Connected pt to bedside continuous pulse ox monitor.  ?

## 2022-03-01 NOTE — H&P (Signed)
History and Physical    [name] PVV:[id number] DOB: 13-Jan-1946 DOA: 03/01/2022  PCP: Josetta Huddle, MD Patient coming from: Home  Chief Complaint: Weakness and falls  HPI: [name] is a 76 y.o. 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  He has had multiple Foley catheters in the past for urinary retention which he does not have it anymore.  Patient had a ESBL UTI in March 2023.  He was treated with ertapenem for 2 weeks.  Patient is on nitrofurantoin for UTI prophylaxis since 02/27/2022.  Wife also reported he got ceftriaxone 2 doses at his PCPs office.  Patient saw infectious disease Dr. Gale Journey yesterday at his office. His wife brought him to the ER as patient had multiple falls generalized weakness decreased p.o. intake and fever with very dark appearing urine. Patient has been followed by neurologist Dr. Carles Collet for Parkinson's disease and she has been adjusting his medications due to increased falls. He denies dysuria suprapubic pain tenderness frequency.  He feels like he empties his bladder completely.  In March while he was in the hospital bladder scan was normal or did not have urinary retention. Past urine cultures ESBL Klebsiella sensitive to meropenem  ED Course: Patient received a dose of meropenem. CT head mild cerebral atrophy CBC CMP essentially normal UA positive for ketones large amount of leukocytes and proteins more than 50 WBC  Review of Systems: As per HPI otherwise all other systems reviewed and are negative  Ambulatory Status: Walks with a front wheel walker has shuffling gait due to Parkinson's disease  Past Medical History:  Diagnosis Date   Allergic rhinitis    Arthritis    BPH (benign prostatic hyperplasia)    Diabetes ([place])    Dysphagia, pharyngeal phase    GERD diagnosed on barium swallow. Has small hiatal hernia. Symptomatically somewhat better on omeprazole but  not entirely. We'll try b.i.d. therapy   Edema    1+ in both ankles, likely multifactorial including medication such as Requip   Erectile dysfunction    Staxyn 10 mg or Viagra worked well. 3 samples of Cialis 20 mg provided   GERD (gastroesophageal reflux disease)    Hypercholesteremia    Hypertension    Nephrolithiasis    Onychomycosis of toenail    April 27, 2013 - Dr. Inocencio Homes - podiatry, was in [place] - treating with oral Lamisil and topical nail therapy   Parkinson's disease [facility])     followed by Dr. Lezlie Octave at [facility] and [name], M.D. in [place]   Presbycusis    and tinnitus - Dr. Izora Gala - August/2013   Renal calculus    Syncope     Past Surgical History:  Procedure Laterality Date   HAND SURGERY     INGUINAL HERNIA REPAIR Left 11/04/2015   Procedure: LEFT INGUINAL HERNIA REPAIR WITH MESH;  Surgeon: Armandina Gemma, MD;  Location: [place];  Service: General;  Laterality: Left;   INSERTION OF MESH Left 11/04/2015   Procedure: INSERTION OF MESH;  Surgeon: Armandina Gemma, MD;  Location: [place];  Service: General;  Laterality: Left;   JOINT REPLACEMENT Bilateral    KNEE SURGERY     TOTAL KNEE ARTHROPLASTY      Social History   Socioeconomic History   Marital status: Married    Spouse name: Not on file   Number of children: Not  on file   Years of education: Not on file   Highest education level: Not on file  Occupational History   Not on file  Tobacco Use   Smoking status: Never   Smokeless tobacco: Never  Vaping Use   Vaping Use: Never used  Substance and Sexual Activity   Alcohol use: No   Drug use: No   Sexual activity: Not on file  Other Topics Concern   Not on file  Social History Narrative   Right Handed    Lives 3 story home   Social Determinants of Health   Financial Resource Strain: Not on file  Food Insecurity: Not on file  Transportation Needs: Not on file  Physical Activity: Not on file  Stress:  Not on file  Social Connections: Not on file  Intimate Partner Violence: Not on file    Allergies  Allergen Reactions   Oxycodone Itching    This makes him itch   Sudafed [Pseudoephedrine Hcl]     Problems urinating     Family History  Problem Relation Age of Onset   Atrial fibrillation Mother    Breast cancer Mother    Emphysema Father    Heart disease Father    Cancer Brother        African Burkitt     Prior to Admission medications   Medication Sig Start Date End Date Taking? Authorizing Provider  acetaminophen (TYLENOL) 500 MG tablet Take 500 mg by mouth every 6 (six) hours as needed for fever.    [provider]  amantadine (SYMMETREL) 100 MG capsule TAKE 1 CAPSULE BY MOUTH THREE TIMES A DAY Patient not taking: Reported on 02/28/2022 12/04/21   [name], [name], DO  carbidopa-levodopa (SINEMET IR) 25-100 MG tablet TAKE 3 TABLETS BY MOUTH AT 7:00 AM, 2 TABLETS AT 10:00 AM, 2 TABLETS AT 12:00 PM, AND 2 TABLETS AT 2:00 PM TAKE 2 TABLETS AT 4:00 TAKE 2 TABLETS AT 6:00 PM 01/31/22   [name], [name], DO  cholecalciferol (VITAMIN D) 1000 UNITS tablet Take 2,000 Units by mouth daily.    [provider]  entacapone (COMTAN) 200 MG tablet Take 1 tablet (200 mg total) by mouth 3 (three) times daily. 1 tablet with first THREE dosages of levodopa Patient taking differently: Take 200 mg by mouth 3 (three) times daily. 06/23/21   [name], [name], DO  fosfomycin (MONUROL) 3 g PACK Take 3 g by mouth every other day for 3 doses. 02/28/22 03/05/22  Vu, [name], MD  furosemide (LASIX) 20 MG tablet Take 3 tablets (60 mg total) by mouth 2 (two) times daily. Patient not taking: Reported on 02/28/2022 07/24/21   Jerline Pain, MD  GLOBAL EASE INJECT PEN NEEDLES 31G X 5 MM MISC Inject into the skin. 05/09/21   [provider]  LANTUS SOLOSTAR 100 UNIT/ML Solostar Pen 15 Units at bedtime. 05/03/21   [provider]  lisinopril (ZESTRIL) 5 MG tablet Take 5 mg by mouth daily.  12/06/21   [provider]  Loteprednol Etabonate (LOTEMAX SM) 0.38 % GEL Place 1 drop into both eyes 2 (two) times daily. Patient not taking: Reported on 02/28/2022    [provider]  metFORMIN (GLUCOPHAGE) 1000 MG tablet Take 1,000 mg by mouth 2 (two) times daily with a meal.     [provider]  mirtazapine (REMERON) 7.5 MG tablet Take 1 tablet (7.5 mg total) by mouth at bedtime. Patient not taking: Reported on 02/28/2022 01/05/22   Starla Link,  Kshitiz, MD  naproxen sodium (ALEVE) 220 MG tablet Take 440 mg by mouth daily as needed (fever).    [provider]  omeprazole (PRILOSEC) 20 MG capsule Take 20 mg by mouth 2 (two) times daily before a meal.    [provider]  potassium chloride SA (KLOR-CON) 20 MEQ tablet Take 20 mEq by mouth daily. 06/16/21   [provider]  rOPINIRole (REQUIP) 3 MG tablet Take 1 tablet in the morning, Take  1 in the afternoon, Take a  half tablet in the evening 02/27/22   [name], Rebecca S, DO  Saw Palmetto 500 MG CAPS Take 1 capsule by mouth daily. Patient not taking: Reported on 02/28/2022    [provider]  simvastatin (ZOCOR) 40 MG tablet Take 40 mg by mouth every evening. Patient not taking: Reported on 02/28/2022    [provider]  tamsulosin (FLOMAX) 0.4 MG CAPS capsule Take 0.8 mg by mouth daily. 01/24/18   [provider]  triamcinolone (NASACORT) 55 MCG/ACT AERO nasal inhaler Place 2 sprays into the nose daily.    [provider]  vitamin B-12 (CYANOCOBALAMIN) 1000 MCG tablet Take 1,000 mcg by mouth daily.    [provider]    Physical Exam: Vitals:   03/01/22 1315 03/01/22 1330 03/01/22 1345 03/01/22 1400  BP:  139/65  (!) 142/70  Pulse: 62 (!) 58 61 (!) 58  Resp: (!) '22 18 18 20  '$ Temp:      TempSrc:      SpO2: 96% 97% 98% 92%  Weight:      Height:         General:  Appears calm and comfortable Eyes:  PERRL, EOMI, normal lids, iris ENT: grossly normal  hearing, lips & tongue, mmm Neck:  no LAD, masses or thyromegaly Cardiovascular:  RRR, no m/r/g. No LE edema.  Respiratory:  CTA bilaterally, no w/r/r. Normal respiratory effort. Abdomen:  soft, ntnd, NABS Skin:  no rash or induration seen on limited exam Musculoskeletal: trace edema b/l Psychiatric:  grossly normal mood and affect, speech fluent and appropriate, AOx3 Neurologic:  CN 2-12 grossly intact, moves all extremities in coordinated fashion, sensation intact  Labs on Admission: I have personally reviewed following labs and imaging studies  CBC: Recent Labs  Lab 03/01/22 1131  WBC 9.6  NEUTROABS 8.2*  HGB 13.9  HCT 43.2  MCV 91.5  PLT 175   Basic Metabolic Panel: Recent Labs  Lab 03/01/22 1131  NA 137  K 4.2  CL 101  CO2 26  GLUCOSE 209*  BUN 16  CREATININE 1.24  CALCIUM 9.2   GFR: Estimated Creatinine Clearance: 56.5 mL/min (by C-G formula based on SCr of 1.24 mg/dL). Liver Function Tests: Recent Labs  Lab 03/01/22 1131  AST 14*  ALT <5  ALKPHOS 90  BILITOT 1.0  PROT 7.3  ALBUMIN 3.5   No results for input(s): LIPASE, AMYLASE in the last 168 hours. No results for input(s): AMMONIA in the last 168 hours. Coagulation Profile: Recent Labs  Lab 03/01/22 1131  INR 1.1   Cardiac Enzymes: No results for input(s): CKTOTAL, CKMB, CKMBINDEX, TROPONINI in the last 168 hours. BNP (last 3 results) No results for input(s): PROBNP in the last 8760 hours. HbA1C: No results for input(s): HGBA1C in the last 72 hours. CBG: No results for input(s): GLUCAP in the last 168 hours. Lipid Profile: No results for input(s): CHOL, HDL, LDLCALC, TRIG, CHOLHDL, LDLDIRECT in the last 72 hours. Thyroid Function Tests: No results  for input(s): TSH, T4TOTAL, FREET4, T3FREE, THYROIDAB in the last 72 hours. Anemia Panel: No results for input(s): VITAMINB12, FOLATE, FERRITIN, TIBC, IRON, RETICCTPCT in the last 72 hours. Urine analysis:    Component Value Date/Time    COLORURINE YELLOW 03/01/2022 1214   APPEARANCEUR HAZY (A) 03/01/2022 1214   LABSPEC 1.016 03/01/2022 1214   PHURINE 6.0 03/01/2022 1214   GLUCOSEU NEGATIVE 03/01/2022 1214   HGBUR SMALL (A) 03/01/2022 1214   BILIRUBINUR NEGATIVE 03/01/2022 1214   KETONESUR 20 (A) 03/01/2022 1214   PROTEINUR 30 (A) 03/01/2022 1214   UROBILINOGEN 0.2 10/25/2009 1404   NITRITE NEGATIVE 03/01/2022 1214   LEUKOCYTESUR LARGE (A) 03/01/2022 1214    Creatinine Clearance: Estimated Creatinine Clearance: 56.5 mL/min (by C-G formula based on SCr of 1.24 mg/dL).  Sepsis Labs: '@LABRCNTIP'$ (procalcitonin:4,lacticidven:4) )No results found for this or any previous visit (from the past 240 hour(s)).   Radiological Exams on Admission: CT Head Wo Contrast  Result Date: 03/01/2022 CLINICAL DATA:  Mental status change. EXAM: CT HEAD WITHOUT CONTRAST TECHNIQUE: Contiguous axial images were obtained from the base of the skull through the vertex without intravenous contrast. RADIATION DOSE REDUCTION: This exam was performed according to the departmental dose-optimization program which includes automated exposure control, adjustment of the mA and/or kV according to patient size and/or use of iterative reconstruction technique. COMPARISON:  No recent examination. MRI examination dated October 03, 2009 FINDINGS: Brain: No evidence of acute infarction, hemorrhage, hydrocephalus, extra-axial collection or mass lesion/mass effect. Mild cerebral atrophy and chronic microvascular ischemic changes of the white matter. Vascular: No hyperdense vessel or unexpected calcification. Skull: Normal. Negative for fracture or focal lesion. Sinuses/Orbits: No acute finding. Other: None. IMPRESSION: 1.  No acute intracranial abnormality. 2. Mild cerebral atrophy and chronic microvascular ischemic changes of the white matter. Electronically Signed   By: Keane Police D.O.   On: 03/01/2022 12:40   DG Chest Port 1 View  Result Date: 03/01/2022 CLINICAL  DATA:  Sepsis EXAM: PORTABLE CHEST 1 VIEW COMPARISON:  None Available. FINDINGS: The heart size and mediastinal contours are within normal limits. Both lungs are clear. The visualized skeletal structures are unremarkable. IMPRESSION: No active disease. Electronically Signed   By: Kathreen Devoid M.D.   On: 03/01/2022 11:03      Assessment/Plan Principal Problem:   UTI (urinary tract infection)   #1 recurrent urinary tract infection for the past few months-5 tells me he had a prostate surgery almost a year ago then he had to have multiple catheters in and out multiple times ever since patient has been having increased incidence of urinary tract infection.  Prior urine culture with ESBL Klebsiella pneumonia sensitive to Carbapenem. I will continue him on meropenem and consult infectious disease. Bladder scan Hold Lasix  #2 Parkinson's disease worse with #1.  Followed by neurology.  Continue home meds.  #3 type 2 diabetes continue SSI On Lantus, metformin at home  #4 hypertension on ACE inhibitor restart Hold Lasix  #5 BPH on Flomax continue   Estimated body mass index is 25.9 kg/m as calculated from the following:   Height as of this encounter: 6' (1.829 m).   Weight as of this encounter: 86.6 kg.   DVT prophylaxis:  lovenox Code Status:  full Family Communication:  dw wife Disposition Plan:  home Consults called:  id Admission status:  in patient   Georgette Shell MD [facility]  If 7PM-7AM, please contact night-coverage [email or web address] Password [facility]  03/01/2022, 4:04 PM

## 2022-03-01 NOTE — ED Notes (Signed)
Provided pt with [place] sandwich and diet coke

## 2022-03-02 ENCOUNTER — Encounter (HOSPITAL_COMMUNITY): Payer: Self-pay | Admitting: Internal Medicine

## 2022-03-02 DIAGNOSIS — N3000 Acute cystitis without hematuria: Secondary | ICD-10-CM

## 2022-03-02 DIAGNOSIS — G20 Parkinson's disease: Secondary | ICD-10-CM | POA: Diagnosis not present

## 2022-03-02 DIAGNOSIS — N390 Urinary tract infection, site not specified: Secondary | ICD-10-CM | POA: Diagnosis not present

## 2022-03-02 LAB — COMPREHENSIVE METABOLIC PANEL
ALT: 9 U/L (ref 0–44)
AST: 16 U/L (ref 15–41)
Albumin: 2.9 g/dL — ABNORMAL LOW (ref 3.5–5.0)
Alkaline Phosphatase: 77 U/L (ref 38–126)
Anion gap: 8 (ref 5–15)
BUN: 16 mg/dL (ref 8–23)
CO2: 25 mmol/L (ref 22–32)
Calcium: 8.5 mg/dL — ABNORMAL LOW (ref 8.9–10.3)
Chloride: 105 mmol/L (ref 98–111)
Creatinine, Ser: 1.21 mg/dL (ref 0.61–1.24)
GFR, Estimated: >60 mL/min (ref >60)
Glucose, Bld: 175 mg/dL — ABNORMAL HIGH (ref 70–99)
Potassium: 3.8 mmol/L (ref 3.5–5.1)
Sodium: 138 mmol/L (ref 135–145)
Total Bilirubin: 0.7 mg/dL (ref 0.3–1.2)
Total Protein: 6.2 g/dL — ABNORMAL LOW (ref 6.5–8.1)

## 2022-03-02 LAB — URINALYSIS, ROUTINE W REFLEX MICROSCOPIC
Bilirubin Urine: NEGATIVE
Hyaline Cast: NONE SEEN /LPF
Nitrite: POSITIVE — AB
Specific Gravity, Urine: 1.024 (ref 1.001–1.035)
Squamous Epithelial / HPF: NONE SEEN /HPF (ref <=5)
WBC, UA: >=60 /HPF — AB (ref 0–5)
pH: 5.5 (ref 5.0–8.0)

## 2022-03-02 LAB — CBC
HCT: 38.3 % — ABNORMAL LOW (ref 39.0–52.0)
Hemoglobin: 13 g/dL (ref 13.0–17.0)
MCH: 30.7 pg (ref 26.0–34.0)
MCHC: 33.9 g/dL (ref 30.0–36.0)
MCV: 90.3 fL (ref 80.0–100.0)
Platelets: 208 10*3/uL (ref 150–400)
RBC: 4.24 MIL/uL (ref 4.22–5.81)
RDW: 14.2 % (ref 11.5–15.5)
WBC: 7.3 10*3/uL (ref 4.0–10.5)
nRBC: 0 % (ref 0.0–0.2)

## 2022-03-02 LAB — URINE CULTURE
MICRO NUMBER:: 13413552
SPECIMEN QUALITY:: ADEQUATE

## 2022-03-02 LAB — GLUCOSE, CAPILLARY
Glucose-Capillary: 163 mg/dL — ABNORMAL HIGH (ref 70–99)
Glucose-Capillary: 300 mg/dL — ABNORMAL HIGH (ref 70–99)
Glucose-Capillary: 218 mg/dL — ABNORMAL HIGH (ref 70–99)
Glucose-Capillary: 240 mg/dL — ABNORMAL HIGH (ref 70–99)

## 2022-03-02 MED ORDER — INSULIN GLARGINE-YFGN 100 UNIT/ML ~~LOC~~ SOLN
10.0000 [IU] | Freq: Every day | SUBCUTANEOUS | Status: DC
Start: 2022-03-02 — End: 2022-03-07
  Administered 2022-03-02 – 2022-03-06 (×5): 10 [IU] via SUBCUTANEOUS
  Filled 2022-03-02 (×6): qty 0.1

## 2022-03-02 MED ORDER — LISINOPRIL 5 MG PO TABS
5.0000 mg | ORAL_TABLET | Freq: Every day | ORAL | Status: DC
  Administered 2022-03-02 – 2022-03-07 (×6): 5 mg via ORAL
  Filled 2022-03-02 (×6): qty 1

## 2022-03-02 NOTE — Consult Note (Signed)
[name] for Infectious Disease    Date of Admission:  03/01/2022     Reason for Consult: ESBL UTI     Referring Physician: Dr Rodena Piety  Current antibiotics: Meropenem  ASSESSMENT:    76 y.o. male admitted with:  Recurrent urinary tract infection Parkinson disease  RECOMMENDATIONS:    Continue meropenem Follow cultures Dr. Gale Journey here this weekend   Principal Problem:   UTI (urinary tract infection)   MEDICATIONS:    Scheduled Meds:  carbidopa-levodopa  3 tablet Oral q AM   And   carbidopa-levodopa  2 tablet Oral 5 times per day   cholecalciferol  2,000 Units Oral Daily   enoxaparin (LOVENOX) injection  40 mg Subcutaneous Q24H   entacapone  200 mg Oral 3 times per day   insulin aspart  0-15 Units Subcutaneous TID WC   insulin aspart  0-5 Units Subcutaneous QHS   tamsulosin  0.8 mg Oral Daily   Continuous Infusions:  sodium chloride 75 mL/hr at 03/02/22 0630   meropenem (MERREM) IV 1 g (03/02/22 0815)   PRN Meds:.  HPI:    [name] is a 76 y.o. male with a past medical history of Parkinson's disease, GERD, BPH, diabetes, recurrent UTIs.  He was recently seen in clinic on 5/17 by my partner, Dr. Gale Journey.  During that visit they discussed trying fosfomycin as a potential new treatment option for his history of urinary tract infections with cultures isolating ESBL Klebsiella.  Unfortunately, following that visit with Dr. Gale Journey patient developed worsening symptoms of gait instability and worsening parkinsonism with weakness, malaise, and confusion.  As a result, he presented to the emergency department overnight.  He was found to be afebrile with no evidence of lactic acidosis.  His WBC was normal and creatinine at baseline.  He was started empirically on meropenem and cultures were obtained.  Blood cultures are negative at this time.  Urinalysis was notable for negative nitrites, pyuria, and rare bacteria.  2 days prior on 5/17 he was noted to have positive nitrites,  pyuria, and many bacteria.  Of note, he had received 2 doses of ceftriaxone via his PCP office prior to this admission.  His urine culture currently has 2000 colonies of gram-negative rods.  Further identification is pending.  This morning the he and his wife notes significant improvement in his subjective feeling since getting started on meropenem.   Past Medical History:  Diagnosis Date   Allergic rhinitis    Arthritis    BPH (benign prostatic hyperplasia)    Diabetes ([place])    Dysphagia, pharyngeal phase    GERD diagnosed on barium swallow. Has small hiatal hernia. Symptomatically somewhat better on omeprazole but not entirely. We'll try b.i.d. therapy   Edema    1+ in both ankles, likely multifactorial including medication such as Requip   Erectile dysfunction    Staxyn 10 mg or Viagra worked well. 3 samples of Cialis 20 mg provided   GERD (gastroesophageal reflux disease)    Hypercholesteremia    Hypertension    Nephrolithiasis    Onychomycosis of toenail    April 27, 2013 - Dr. Inocencio Homes - podiatry, was in [place] - treating with oral Lamisil and topical nail therapy   Parkinson's disease [facility])     followed by Dr. Lezlie Octave at [facility] and [name], M.D. in [facility]   Presbycusis    and tinnitus - Dr. Izora Gala - August/2013   Renal calculus    Syncope  Social History   Tobacco Use   Smoking status: Never   Smokeless tobacco: Never  Vaping Use   Vaping Use: Never used  Substance Use Topics   Alcohol use: No   Drug use: No    Family History  Problem Relation Age of Onset   Atrial fibrillation Mother    Breast cancer Mother    Emphysema Father    Heart disease Father    Cancer Brother        African Burkitt    Allergies  Allergen Reactions   Oxycodone Itching    This makes him itch   Sudafed [Pseudoephedrine Hcl]     Problems urinating     Review of Systems  All other systems reviewed and are negative. Except as noted otherwise in HPI>    OBJECTIVE:   Blood pressure (!) 149/55, pulse 88, temperature 98.4 F (36.9 C), resp. rate 20, height 6' (1.829 m), weight 86.6 kg, SpO2 98 %. Body mass index is 25.9 kg/m.  Physical Exam Constitutional:      General: He is not in acute distress.    Appearance: Normal appearance.  HENT:     Head: Normocephalic and atraumatic.     Mouth/Throat:     Mouth: Mucous membranes are moist.     Pharynx: Oropharynx is clear.  Eyes:     Extraocular Movements: Extraocular movements intact.     Conjunctiva/sclera: Conjunctivae normal.  Pulmonary:     Effort: Pulmonary effort is normal. No respiratory distress.  Abdominal:     General: There is no distension.  Musculoskeletal:     Cervical back: Normal range of motion and neck supple.     Right lower leg: No edema.     Left lower leg: No edema.  Skin:    General: Skin is warm and dry.     Findings: No rash.  Neurological:     General: No focal deficit present.     Mental Status: He is alert and oriented to person, place, and time.  Psychiatric:        Mood and Affect: Mood normal.        Behavior: Behavior normal.     Lab Results: Lab Results  Component Value Date   WBC 7.3 03/02/2022   HGB 13.0 03/02/2022   HCT 38.3 (L) 03/02/2022   MCV 90.3 03/02/2022   PLT 208 03/02/2022    Lab Results  Component Value Date   NA 138 03/02/2022   K 3.8 03/02/2022   CO2 25 03/02/2022   GLUCOSE 175 (H) 03/02/2022   BUN 16 03/02/2022   CREATININE 1.21 03/02/2022   CALCIUM 8.5 (L) 03/02/2022   GFRNONAA >60 03/02/2022   GFRAA >60 11/01/2015    Lab Results  Component Value Date   ALT 9 03/02/2022   AST 16 03/02/2022   ALKPHOS 77 03/02/2022   BILITOT 0.7 03/02/2022       Component Value Date/Time   CRP 0.0 (L) 10/25/2009 1404       Component Value Date/Time   ESRSEDRATE 2 10/25/2009 1404    I have reviewed the micro and lab results in Epic.  Imaging: CT Head Wo Contrast  Result Date: 03/01/2022 CLINICAL DATA:   Mental status change. EXAM: CT HEAD WITHOUT CONTRAST TECHNIQUE: Contiguous axial images were obtained from the base of the skull through the vertex without intravenous contrast. RADIATION DOSE REDUCTION: This exam was performed according to the departmental dose-optimization program which includes automated exposure control, adjustment of the mA  and/or kV according to patient size and/or use of iterative reconstruction technique. COMPARISON:  No recent examination. MRI examination dated October 03, 2009 FINDINGS: Brain: No evidence of acute infarction, hemorrhage, hydrocephalus, extra-axial collection or mass lesion/mass effect. Mild cerebral atrophy and chronic microvascular ischemic changes of the white matter. Vascular: No hyperdense vessel or unexpected calcification. Skull: Normal. Negative for fracture or focal lesion. Sinuses/Orbits: No acute finding. Other: None. IMPRESSION: 1.  No acute intracranial abnormality. 2. Mild cerebral atrophy and chronic microvascular ischemic changes of the white matter. Electronically Signed   By: Keane Police D.O.   On: 03/01/2022 12:40   DG Chest Port 1 View  Result Date: 03/01/2022 CLINICAL DATA:  Sepsis EXAM: PORTABLE CHEST 1 VIEW COMPARISON:  None Available. FINDINGS: The heart size and mediastinal contours are within normal limits. Both lungs are clear. The visualized skeletal structures are unremarkable. IMPRESSION: No active disease. Electronically Signed   By: Kathreen Devoid M.D.   On: 03/01/2022 11:03     Imaging independently reviewed in Epic.  [facility] for Infectious Disease [facility] [phone] pager 03/02/2022, 11:32 AM

## 2022-03-02 NOTE — Progress Notes (Signed)
PROGRESS NOTE    [name]  ERD:[id number] DOB: 03-Feb-1946 DOA: 03/01/2022 PCP: Josetta Huddle, MD  Brief Narrative:  [name] is a 76 y.o. 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  He has had multiple Foley catheters in the past for urinary retention which he does not have it anymore.  Patient had a ESBL UTI in March 2023.  He was treated with ertapenem for 2 weeks.  Patient is on nitrofurantoin for UTI prophylaxis since 02/27/2022.  Wife also reported he got ceftriaxone 2 doses at his PCPs office.  Patient saw infectious disease Dr. Gale Journey yesterday at his office. His wife brought him to the ER as patient had multiple falls generalized weakness decreased p.o. intake and fever with very dark appearing urine. Patient has been followed by neurologist Dr. Carles Collet for Parkinson's disease and she has been adjusting his medications due to increased falls. He denies dysuria suprapubic pain tenderness frequency.  He feels like he empties his bladder completely.  In March while he was in the hospital bladder scan was normal or did not have urinary retention. Past urine cultures ESBL Klebsiella sensitive to meropenem   ED Course: Patient received a dose of meropenem. CT head mild cerebral atrophy CBC CMP essentially normal UA positive for ketones large amount of leukocytes and proteins more than 50 WBC Assessment & Plan:   Principal Problem:   UTI (urinary tract infection)   #1 recurrent urinary tract infection for the past few months-he tells me he had a prostate surgery almost a year ago then he had to have multiple catheters in and out multiple times ever since patient has been having increased incidence of urinary tract infection.  Prior urine culture with ESBL Klebsiella pneumonia sensitive to Carbapenem. Appreciate id input Continue meropenem Bladder scan Hold Lasix   #2 Parkinson's disease-.   Continue home meds.   #3 type 2 diabetes continue SSI Restart lantus On Lantus, metformin at home CBG (last 3)  Recent Labs    03/01/22 2156 03/02/22 0722 03/02/22 1134  GLUCAP 197* 163* 300*      #4 hypertension on ACE inhibitor restart Hold Lasix   #5 BPH on Flomax continue    Estimated body mass index is 25.9 kg/m as calculated from the following:   Height as of this encounter: 6' (1.829 m).   Weight as of this encounter: 86.6 kg.  DVT prophylaxis: Lovenox Code Status: Full code Family Communication: Discussed with wife Disposition Plan:  Status is: Inpatient Remains inpatient appropriate because: IV meropenem   Consultants:  Infectious disease  Procedures: None Antimicrobials: Meropenem Subjective: Patient much more awake alert feels better than yesterday denies abdominal pain dysuria nausea vomiting  Objective: Vitals:   03/01/22 1833 03/01/22 2200 03/02/22 0152 03/02/22 0609  BP: (!) 157/75 136/71 134/65 (!) 149/55  Pulse: 87 80 60 88  Resp: '19 18 20 20  '$ Temp: 99.6 F (37.6 C) 98.8 F (37.1 C) 98.4 F (36.9 C) 98.4 F (36.9 C)  TempSrc: Oral Oral    SpO2: 98% 98% 96% 98%  Weight:      Height:        Intake/Output Summary (Last 24 hours) at 03/02/2022 1258 Last data filed at 03/02/2022 0839 Gross per 24 hour  Intake 596 ml  Output 1000 ml  Net -404 ml   Filed Weights   03/01/22 0921  Weight: 86.6 kg  Examination:  General exam: Appears more awake alert In nad  Respiratory system: Clear to auscultation. Respiratory effort normal. Cardiovascular system: S1 & S2 heard, RRR. No JVD, murmurs, rubs, gallops or clicks. No pedal edema. Gastrointestinal system: Abdomen is nondistended, soft and nontender. No organomegaly or masses felt. Normal bowel sounds heard. Central nervous system: Alert and oriented. No focal neurological deficits. Extremities: Symmetric 5 x 5 power. Skin: No rashes, lesions or ulcers Psychiatry: Judgement and insight  appear normal. Mood & affect appropriate.     Data Reviewed: I have personally reviewed following labs and imaging studies  CBC: Recent Labs  Lab 03/01/22 1131 03/02/22 0522  WBC 9.6 7.3  NEUTROABS 8.2*  --   HGB 13.9 13.0  HCT 43.2 38.3*  MCV 91.5 90.3  PLT 216 415   Basic Metabolic Panel: Recent Labs  Lab 03/01/22 1131 03/02/22 0522  NA 137 138  K 4.2 3.8  CL 101 105  CO2 26 25  GLUCOSE 209* 175*  BUN 16 16  CREATININE 1.24 1.21  CALCIUM 9.2 8.5*   GFR: Estimated Creatinine Clearance: 57.9 mL/min (by C-G formula based on SCr of 1.21 mg/dL). Liver Function Tests: Recent Labs  Lab 03/01/22 1131 03/02/22 0522  AST 14* 16  ALT <5 9  ALKPHOS 90 77  BILITOT 1.0 0.7  PROT 7.3 6.2*  ALBUMIN 3.5 2.9*   No results for input(s): LIPASE, AMYLASE in the last 168 hours. No results for input(s): AMMONIA in the last 168 hours. Coagulation Profile: Recent Labs  Lab 03/01/22 1131  INR 1.1   Cardiac Enzymes: No results for input(s): CKTOTAL, CKMB, CKMBINDEX, TROPONINI in the last 168 hours. BNP (last 3 results) No results for input(s): PROBNP in the last 8760 hours. HbA1C: No results for input(s): HGBA1C in the last 72 hours. CBG: Recent Labs  Lab 03/01/22 1837 03/01/22 2156 03/02/22 0722 03/02/22 1134  GLUCAP 183* 197* 163* 300*   Lipid Profile: No results for input(s): CHOL, HDL, LDLCALC, TRIG, CHOLHDL, LDLDIRECT in the last 72 hours. Thyroid Function Tests: No results for input(s): TSH, T4TOTAL, FREET4, T3FREE, THYROIDAB in the last 72 hours. Anemia Panel: No results for input(s): VITAMINB12, FOLATE, FERRITIN, TIBC, IRON, RETICCTPCT in the last 72 hours. Sepsis Labs: Recent Labs  Lab 03/01/22 1131 03/01/22 1341  LATICACIDVEN 1.2 1.0    Recent Results (from the past 240 hour(s))  Urine Culture     Status: None   Collection Time: 02/28/22  4:55 AM   Specimen: Urine  Result Value Ref Range Status   MICRO NUMBER: [id number]  Final   SPECIMEN  QUALITY: Adequate  Final   Sample Source URINE  Final   STATUS: FINAL  Final   ISOLATE 1:   Final    Less than 10,000 CFU/mL of single Gram negative organism isolated. No further testing will be performed. If clinically indicated, recollection using a method to minimize contamination, with prompt transfer to Urine Culture Transport Tube, is recommended.  Blood Culture (routine x 2)     Status: None (Preliminary result)   Collection Time: 03/01/22 11:31 AM   Specimen: BLOOD  Result Value Ref Range Status   Specimen Description   Final    BLOOD BLOOD RIGHT FOREARM Performed at [facility] [address] [place], [place] [address]    Special Requests   Final    BOTTLES DRAWN AEROBIC AND ANAEROBIC Blood Culture results may not be optimal due to an excessive volume of blood received in culture bottles Performed  at [facility], [place] [address] [place], [place] [address]    Culture   Final    NO GROWTH < 24 HOURS Performed at [facility] [address] [place], [place] [address]    Report Status PENDING  Incomplete  Blood Culture (routine x 2)     Status: None (Preliminary result)   Collection Time: 03/01/22 11:31 AM   Specimen: BLOOD  Result Value Ref Range Status   Specimen Description   Final    BLOOD RIGHT ANTECUBITAL Performed at [facility] [address], [place], [place] [address]    Special Requests   Final    BOTTLES DRAWN AEROBIC AND ANAEROBIC Blood Culture adequate volume Performed at [facility] [address] [place], [place] [address]    Culture   Final    NO GROWTH < 24 HOURS Performed at [facility] [address] [place], [place] [address]    Report Status PENDING  Incomplete  Urine Culture     Status: Abnormal (Preliminary result)   Collection Time: 03/01/22 12:14 PM   Specimen: In/Out Cath Urine  Result Value Ref Range Status   Specimen Description   Final     IN/OUT CATH URINE Performed at [facility] [address] [place], [place] [address]    Special Requests   Final    NONE Performed at [facility], [place] [address], [place], [place] [address]    Culture 2,000 COLONIES/mL GRAM NEGATIVE RODS (A)  Final   Report Status PENDING  Incomplete         Radiology Studies: CT Head Wo Contrast  Result Date: 03/01/2022 CLINICAL DATA:  Mental status change. EXAM: CT HEAD WITHOUT CONTRAST TECHNIQUE: Contiguous axial images were obtained from the base of the skull through the vertex without intravenous contrast. RADIATION DOSE REDUCTION: This exam was performed according to the departmental dose-optimization program which includes automated exposure control, adjustment of the mA and/or kV according to patient size and/or use of iterative reconstruction technique. COMPARISON:  No recent examination. MRI examination dated October 03, 2009 FINDINGS: Brain: No evidence of acute infarction, hemorrhage, hydrocephalus, extra-axial collection or mass lesion/mass effect. Mild cerebral atrophy and chronic microvascular ischemic changes of the white matter. Vascular: No hyperdense vessel or unexpected calcification. Skull: Normal. Negative for fracture or focal lesion. Sinuses/Orbits: No acute finding. Other: None. IMPRESSION: 1.  No acute intracranial abnormality. 2. Mild cerebral atrophy and chronic microvascular ischemic changes of the white matter. Electronically Signed   By: Keane Police D.O.   On: 03/01/2022 12:40   DG Chest Port 1 View  Result Date: 03/01/2022 CLINICAL DATA:  Sepsis EXAM: PORTABLE CHEST 1 VIEW COMPARISON:  None Available. FINDINGS: The heart size and mediastinal contours are within normal limits. Both lungs are clear. The visualized skeletal structures are unremarkable. IMPRESSION: No active disease. Electronically Signed   By: Kathreen Devoid M.D.   On: 03/01/2022 11:03        Scheduled Meds:   carbidopa-levodopa  3 tablet Oral q AM   And   carbidopa-levodopa  2 tablet Oral 5 times per day   cholecalciferol  2,000 Units Oral Daily   enoxaparin (LOVENOX) injection  40 mg Subcutaneous Q24H   entacapone  200 mg Oral 3 times per day   insulin aspart  0-15 Units Subcutaneous TID WC   insulin aspart  0-5 Units Subcutaneous QHS   tamsulosin  0.8 mg Oral Daily  Continuous Infusions:  sodium chloride 75 mL/hr at 03/02/22 0630   meropenem (MERREM) IV 1 g (03/02/22 0815)     LOS: 1 day    Time spent: 79 min    Georgette Shell, MD  03/02/2022, 12:58 PM

## 2022-03-02 NOTE — Plan of Care (Signed)

## 2022-03-03 DIAGNOSIS — N390 Urinary tract infection, site not specified: Secondary | ICD-10-CM | POA: Diagnosis not present

## 2022-03-03 LAB — URINE CULTURE: Culture: 2000 — AB

## 2022-03-03 LAB — GLUCOSE, CAPILLARY
Glucose-Capillary: 154 mg/dL — ABNORMAL HIGH (ref 70–99)
Glucose-Capillary: 217 mg/dL — ABNORMAL HIGH (ref 70–99)
Glucose-Capillary: 156 mg/dL — ABNORMAL HIGH (ref 70–99)
Glucose-Capillary: 154 mg/dL — ABNORMAL HIGH (ref 70–99)

## 2022-03-03 MED ORDER — CHLORHEXIDINE GLUCONATE CLOTH 2 % EX PADS
6.0000 | MEDICATED_PAD | Freq: Every day | CUTANEOUS | Status: DC
  Administered 2022-03-03: 6 via TOPICAL

## 2022-03-03 NOTE — Progress Notes (Signed)
PROGRESS NOTE    [name]  PYP:[id number] DOB: 10-25-1945 DOA: 03/01/2022 PCP: Josetta Huddle, MD  Brief Narrative:  [name] is a 76 y.o. 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  He has had multiple Foley catheters in the past for urinary retention which he does not have it anymore.  Patient had a ESBL UTI in March 2023.  He was treated with ertapenem for 2 weeks.  Patient is on nitrofurantoin for UTI prophylaxis since 02/27/2022.  Wife also reported he got ceftriaxone 2 doses at his PCPs office.  Patient saw infectious disease Dr. Gale Journey yesterday at his office. His wife brought him to the ER as patient had multiple falls generalized weakness decreased p.o. intake and fever with very dark appearing urine. Patient has been followed by neurologist Dr. Carles Collet for Parkinson's disease and she has been adjusting his medications due to increased falls. He denies dysuria suprapubic pain tenderness frequency.  He feels like he empties his bladder completely.  In March while he was in the hospital bladder scan was normal or did not have urinary retention. Past urine cultures ESBL Klebsiella sensitive to meropenem   ED Course: Patient received a dose of meropenem. CT head mild cerebral atrophy CBC CMP essentially normal UA positive for ketones large amount of leukocytes and proteins more than 50 WBC Assessment & Plan:   Principal Problem:   UTI (urinary tract infection)   #1 recurrent urinary tract infection for the past few months-unclear why he gets repeated UTI.  Patient had multiple urinary catheters in and out in the past none for the past few weeks or months.  He has no urinary retention.  Bladder scan here was 0 mL.  urine culture with ESBL Klebsiella pneumonia sensitive to Carbapenem. Appreciate id input Continue meropenem urine culture shows Klebsiella pneumonia ESBL only 20 K Bladder scan with 0  cc Hold Lasix DC IV fluids   #2 Parkinson's disease-.  Continue home meds.   #3 type 2 diabetes continue SSI Restart lantus On Lantus, metformin at home CBG (last 3)  Recent Labs    03/02/22 1725 03/02/22 2141 03/03/22 0717  GLUCAP 218* 240* 154*       #4 hypertension on ACE inhibitor restart Hold Lasix   #5 BPH on Flomax continue    Estimated body mass index is 25.9 kg/m as calculated from the following:   Height as of this encounter: 6' (1.829 m).   Weight as of this encounter: 86.6 kg.  DVT prophylaxis: Lovenox Code Status: Full code Family Communication: Discussed with wife Disposition Plan:  Status is: Inpatient Remains inpatient appropriate because: IV meropenem   Consultants:  Infectious disease  Procedures: None Antimicrobials: Meropenem Subjective:  Patient is resting in bed in no acute distress urine cultures shows ESBL Klebsiella pneumonia but low colony count he had the same thing in March 2023 he feels better Objective: Vitals:   03/02/22 1318 03/02/22 2009 03/03/22 0419 03/03/22 0419  BP: 125/72 133/61 (!) 147/93 (!) 147/93  Pulse: 64 89 (!) 53 (!) 55  Resp: '17 18 18 18  '$ Temp: 97.9 F (36.6 C) 98.8 F (37.1 C) 98.2 F (36.8 C) 98.2 F (36.8 C)  TempSrc: Oral  Oral Oral  SpO2: 97% 98% 98% 98%  Weight:      Height:        Intake/Output Summary (Last 24 hours) at 03/03/2022 1139 Last data  filed at 03/03/2022 1005 Gross per 24 hour  Intake 814 ml  Output 1300 ml  Net -486 ml    Filed Weights   03/01/22 0921  Weight: 86.6 kg    Examination:  General exam: Appears more awake alert In nad  Respiratory system: Clear to auscultation. Respiratory effort normal. Cardiovascular system: S1 & S2 heard, RRR. No JVD, murmurs, rubs, gallops or clicks. No pedal edema. Gastrointestinal system: Abdomen is nondistended, soft and nontender. No organomegaly or masses felt. Normal bowel sounds heard. Central nervous system: Alert and oriented. No  focal neurological deficits. Extremities: Symmetric 5 x 5 power. Skin: No rashes, lesions or ulcers Psychiatry: Judgement and insight appear normal. Mood & affect appropriate.     Data Reviewed: I have personally reviewed following labs and imaging studies  CBC: Recent Labs  Lab 03/01/22 1131 03/02/22 0522  WBC 9.6 7.3  NEUTROABS 8.2*  --   HGB 13.9 13.0  HCT 43.2 38.3*  MCV 91.5 90.3  PLT 216 175    Basic Metabolic Panel: Recent Labs  Lab 03/01/22 1131 03/02/22 0522  NA 137 138  K 4.2 3.8  CL 101 105  CO2 26 25  GLUCOSE 209* 175*  BUN 16 16  CREATININE 1.24 1.21  CALCIUM 9.2 8.5*    GFR: Estimated Creatinine Clearance: 57.9 mL/min (by C-G formula based on SCr of 1.21 mg/dL). Liver Function Tests: Recent Labs  Lab 03/01/22 1131 03/02/22 0522  AST 14* 16  ALT <5 9  ALKPHOS 90 77  BILITOT 1.0 0.7  PROT 7.3 6.2*  ALBUMIN 3.5 2.9*    No results for input(s): LIPASE, AMYLASE in the last 168 hours. No results for input(s): AMMONIA in the last 168 hours. Coagulation Profile: Recent Labs  Lab 03/01/22 1131  INR 1.1    Cardiac Enzymes: No results for input(s): CKTOTAL, CKMB, CKMBINDEX, TROPONINI in the last 168 hours. BNP (last 3 results) No results for input(s): PROBNP in the last 8760 hours. HbA1C: No results for input(s): HGBA1C in the last 72 hours. CBG: Recent Labs  Lab 03/02/22 0722 03/02/22 1134 03/02/22 1725 03/02/22 2141 03/03/22 0717  GLUCAP 163* 300* 218* 240* 154*    Lipid Profile: No results for input(s): CHOL, HDL, LDLCALC, TRIG, CHOLHDL, LDLDIRECT in the last 72 hours. Thyroid Function Tests: No results for input(s): TSH, T4TOTAL, FREET4, T3FREE, THYROIDAB in the last 72 hours. Anemia Panel: No results for input(s): VITAMINB12, FOLATE, FERRITIN, TIBC, IRON, RETICCTPCT in the last 72 hours. Sepsis Labs: Recent Labs  Lab 03/01/22 1131 03/01/22 1341  LATICACIDVEN 1.2 1.0     Recent Results (from the past 240 hour(s))   Urine Culture     Status: None   Collection Time: 02/28/22  4:55 AM   Specimen: Urine  Result Value Ref Range Status   MICRO NUMBER: [id number]  Final   SPECIMEN QUALITY: Adequate  Final   Sample Source URINE  Final   STATUS: FINAL  Final   ISOLATE 1:   Final    Less than 10,000 CFU/mL of single Gram negative organism isolated. No further testing will be performed. If clinically indicated, recollection using a method to minimize contamination, with prompt transfer to Urine Culture Transport Tube, is recommended.  Blood Culture (routine x 2)     Status: None (Preliminary result)   Collection Time: 03/01/22 11:31 AM   Specimen: BLOOD  Result Value Ref Range Status   Specimen Description   Final    BLOOD BLOOD RIGHT FOREARM Performed at [facility]  [place] [address] [place], [place] [address]    Special Requests   Final    BOTTLES DRAWN AEROBIC AND ANAEROBIC Blood Culture results may not be optimal due to an excessive volume of blood received in culture bottles Performed at [facility] [address], [place], [place] [address]    Culture   Final    NO GROWTH 2 DAYS Performed at [facility] [address], [place], [place] [address]    Report Status PENDING  Incomplete  Blood Culture (routine x 2)     Status: None (Preliminary result)   Collection Time: 03/01/22 11:31 AM   Specimen: BLOOD  Result Value Ref Range Status   Specimen Description   Final    BLOOD RIGHT ANTECUBITAL Performed at [facility] [address] [place], [place] [address]    Special Requests   Final    BOTTLES DRAWN AEROBIC AND ANAEROBIC Blood Culture adequate volume Performed at [facility] [address], [place], [place] [address]    Culture   Final    NO GROWTH 2 DAYS Performed at [facility] [address] [place], [place] [address]    Report Status PENDING  Incomplete  Urine Culture     Status:  Abnormal   Collection Time: 03/01/22 12:14 PM   Specimen: In/Out Cath Urine  Result Value Ref Range Status   Specimen Description   Final    IN/OUT CATH URINE Performed at [facility] [address] [place], [place] [address]    Special Requests   Final    NONE Performed at [facility], [place] [address], [place], [place] [address]    Culture (A)  Final    2,000 COLONIES/mL KLEBSIELLA PNEUMONIAE Confirmed Extended Spectrum Beta-Lactamase Producer (ESBL).  In bloodstream infections from ESBL organisms, carbapenems are preferred over piperacillin/tazobactam. They are shown to have a lower risk of mortality.    Report Status 03/03/2022 FINAL  Final   Organism ID, Bacteria KLEBSIELLA PNEUMONIAE (A)  Final      Susceptibility   Klebsiella pneumoniae - MIC*    AMPICILLIN >=32 RESISTANT Resistant     CEFAZOLIN >=64 RESISTANT Resistant     CEFEPIME >=32 RESISTANT Resistant     CEFTRIAXONE >=64 RESISTANT Resistant     CIPROFLOXACIN >=4 RESISTANT Resistant     GENTAMICIN >=16 RESISTANT Resistant     IMIPENEM <=0.25 SENSITIVE Sensitive     NITROFURANTOIN 128 RESISTANT Resistant     TRIMETH/SULFA >=320 RESISTANT Resistant     AMPICILLIN/SULBACTAM >=32 RESISTANT Resistant     PIP/TAZO 64 INTERMEDIATE Intermediate     * 2,000 COLONIES/mL KLEBSIELLA PNEUMONIAE          Radiology Studies: CT Head Wo Contrast  Result Date: 03/01/2022 CLINICAL DATA:  Mental status change. EXAM: CT HEAD WITHOUT CONTRAST TECHNIQUE: Contiguous axial images were obtained from the base of the skull through the vertex without intravenous contrast. RADIATION DOSE REDUCTION: This exam was performed according to the departmental dose-optimization program which includes automated exposure control, adjustment of the mA and/or kV according to patient size and/or use of iterative reconstruction technique. COMPARISON:  No recent examination. MRI examination dated October 03, 2009  FINDINGS: Brain: No evidence of acute infarction, hemorrhage, hydrocephalus, extra-axial collection or mass lesion/mass effect. Mild cerebral atrophy and chronic microvascular ischemic changes of the white matter. Vascular: No hyperdense vessel or unexpected calcification. Skull: Normal. Negative for fracture or  focal lesion. Sinuses/Orbits: No acute finding. Other: None. IMPRESSION: 1.  No acute intracranial abnormality. 2. Mild cerebral atrophy and chronic microvascular ischemic changes of the white matter. Electronically Signed   By: Keane Police D.O.   On: 03/01/2022 12:40        Scheduled Meds:  carbidopa-levodopa  3 tablet Oral q AM   And   carbidopa-levodopa  2 tablet Oral 5 times per day   cholecalciferol  2,000 Units Oral Daily   enoxaparin (LOVENOX) injection  40 mg Subcutaneous Q24H   entacapone  200 mg Oral 3 times per day   insulin aspart  0-15 Units Subcutaneous TID WC   insulin aspart  0-5 Units Subcutaneous QHS   insulin glargine-yfgn  10 Units Subcutaneous QHS   lisinopril  5 mg Oral Daily   tamsulosin  0.8 mg Oral Daily   Continuous Infusions:  sodium chloride 75 mL/hr at 03/03/22 0647   meropenem (MERREM) IV 1 g (03/03/22 1043)     LOS: 2 days    Time spent: 34 min    Georgette Shell, MD  03/03/2022, 11:39 AM

## 2022-03-04 DIAGNOSIS — N390 Urinary tract infection, site not specified: Secondary | ICD-10-CM | POA: Diagnosis not present

## 2022-03-04 LAB — GLUCOSE, CAPILLARY
Glucose-Capillary: 150 mg/dL — ABNORMAL HIGH (ref 70–99)
Glucose-Capillary: 245 mg/dL — ABNORMAL HIGH (ref 70–99)
Glucose-Capillary: 190 mg/dL — ABNORMAL HIGH (ref 70–99)
Glucose-Capillary: 270 mg/dL — ABNORMAL HIGH (ref 70–99)

## 2022-03-04 LAB — VITAMIN B12: Vitamin B-12: 889 pg/mL (ref 180–914)

## 2022-03-04 MED ORDER — DIPHENHYDRAMINE HCL 50 MG/ML IJ SOLN
25.0000 mg | Freq: Once | INTRAMUSCULAR | Status: AC
  Administered 2022-03-04: 25 mg via INTRAVENOUS
  Filled 2022-03-04: qty 1

## 2022-03-04 NOTE — Progress Notes (Signed)
[facility] Inpatient progress note    Patient Active Problem List   Diagnosis Date Noted   UTI (urinary tract infection) 03/01/2022   Leukocytosis 01/01/2022   Hyponatremia 01/01/2022   CKD (chronic kidney disease), stage III ([place]) 01/01/2022   HTN (hypertension) 12/31/2021   Urinary tract infection due to ESBL Klebsiella 12/31/2021   Generalized weakness 12/31/2021   Chronic venous insufficiency 08/29/2021   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   Encounter for general adult medical examination with abnormal findings 02/18/2018   Enlarged prostate 02/18/2018   Hearing loss 02/18/2018   Other long term (current) drug therapy 02/18/2018   Type 2 diabetes mellitus with hyperglycemia ([place]) 02/18/2018   HLD (hyperlipidemia)    Nephrolithiasis    Parkinson's disease ([place])    Diabetes mellitus with coincident hypertension ([place])    Syncope    Renal calculus    Diabetes ([place])    Allergic rhinitis    BPH (benign prostatic hyperplasia)    Erectile dysfunction    Dysphagia, pharyngeal phase    Edema    Ureteral calculus 02/09/2013   LEG PAIN 03/14/2010      HPI: [name] is a 76 y.o. 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 Patient's wife dropped off a urine sample a few days prior to this visit of 02/28/22 asking urology to evaluate as patient has become weaker with more falls concerning for a uti 2) his neurologist has been adjusting parkinson's meds but is wondering if it is due to secondary causes making his parkinsonism temporarily worse rather than primary  progression of his parkinsonism  She last saw his urologist on 01/03/22 during admission 3/19-24 for esbl uti. He had 4 days progressive malaise/generalized weakness and was admitted to get meropnem --> ertapenem for 2 weeks. On follow up 3/22 he was feeling better.  He is currently on nitrofurantoin for prophylaxis, since 5/16. He is getting ceftriaxone as well at the [facility] at [facility].    I reviewed his previous urine culture on epic (last one I could see was 12/31/21) 12/31/21 kleb pna pan-resistant except imipenem, but only 20k colonies 06/2021 citrobacter freundii (S cipro/bactrim/gentamycin/nitrofurantoin) and kleb pna (S imipenem but resistant to cipro/bactrim)   Care everywhere labs: 01/31/22 ucx >100k colonies/ml of kleb pna esbl (R cipro, bactrim; s ertapenem)   He has had about 3-4 episodes of these increased parkinsonism the last 2 years and all got better with abx treatment. He doesn't take over the counter medication intermittently like benadryl that could worsen urinary retention  He denies other sx of typical uti outside of the weakness/malaise and increased parkinsonism (which is mainly more falls and shuffling gait and unsteady). He hasn't had a time of these episode where he clinically watch and improve  He mentions over the last 2 years his parkinson also gets gradually worse  Since his last episode 12/31/21 uti, he had 4-5 good week baseline sx and the last several days have had worsening parkinsonism and weakness/malaise.   He is using a fww now which is very bad  for him.  He hasn't felt better since nitrofurantoin and ceftriaxone yet.   He has not had no prostate sling procedure He last had a foley catheter in 12/2021  Normally he hasn't had to straight cath himself    Meds: Sinemet 25-100 3 tablet in morning, and 2 tablet q2hours Entacapone 200 mg tid ropinarole Lantus Acetaminophen Nitrofurantoin  5/16-c Metformin Lisinopril Omeprazole Tamsulosin Furosemide Simvastatin   ------- 5/21 inpatient rounding I spoke with patient and his wife He was admitted briefly after clinic visit. He reported falling and not able to function He was started on empiric meropenem Ucx grew <100k kleb pna esbl  3 days into abx there is no improvement  I discussed with them that 6 weeks ago with abx patient was able to function (do pt/ot) out of bed in 2-3 days   No fever, chill    Review of Systems: ROS All other ros negative      Past Medical History:  Diagnosis Date   Abnormal involuntary movement 02/18/2018   Allergic rhinitis    Arthritis    BPH (benign prostatic hyperplasia)    Diabetes ([place])    Dysphagia, pharyngeal phase    GERD diagnosed on barium swallow. Has small hiatal hernia. Symptomatically somewhat better on omeprazole but not entirely. We'll try b.i.d. therapy   Edema    1+ in both ankles, likely multifactorial including medication such as Requip   Erectile dysfunction    Staxyn 10 mg or Viagra worked well. 3 samples of Cialis 20 mg provided   GERD (gastroesophageal reflux disease)    Hypercholesteremia    Hypertension    Nephrolithiasis    Onychomycosis of toenail    April 27, 2013 - Dr. Inocencio Homes - podiatry, was in [place] - treating with oral Lamisil and topical nail therapy   Parkinson's disease [facility])     followed by Dr. Lezlie Octave at [facility] and [name], M.D. in [facility]   Presbycusis    and tinnitus - Dr. Izora Gala - August/2013   Renal calculus    Syncope     Social History   Tobacco Use   Smoking status: Never   Smokeless tobacco: Never  Vaping Use   Vaping Use: Never used  Substance Use Topics   Alcohol use: No   Drug use: No    Family History  Problem Relation Age of Onset   Atrial fibrillation Mother    Breast cancer Mother    Emphysema Father    Heart disease Father    Cancer Brother        African Burkitt    Allergies   Allergen Reactions   Oxycodone Itching    This makes him itch   Sudafed [Pseudoephedrine Hcl]     Problems urinating     OBJECTIVE: Vitals:   03/03/22 1526 03/03/22 2022 03/04/22 0431 03/04/22 1537  BP: (!) 143/82 133/74 (!) 142/63 123/63  Pulse: 63 87 (!) 58 75  Resp: '18 14 14 19  '$ Temp: 98.1 F (36.7 C) 98.1 F (36.7 C) 98.7 F (37.1 C) (!) 97.4 F (36.3 C)  TempSrc: Oral Oral Oral Oral  SpO2: 100% 99% 100% 96%  Weight:      Height:       Body mass index is 25.9 kg/m.   Physical Exam This is a telephone call     Lab: Lab Results  Component Value Date   WBC 7.3 03/02/2022   HGB 13.0 03/02/2022   HCT 38.3 (L) 03/02/2022  MCV 90.3 03/02/2022   PLT 208 22/11/5425   Last metabolic panel Lab Results  Component Value Date   GLUCOSE 175 (H) 03/02/2022   NA 138 03/02/2022   K 3.8 03/02/2022   CL 105 03/02/2022   CO2 25 03/02/2022   BUN 16 03/02/2022   CREATININE 1.21 03/02/2022   GFRNONAA >60 03/02/2022   CALCIUM 8.5 (L) 03/02/2022   PROT 6.2 (L) 03/02/2022   ALBUMIN 2.9 (L) 03/02/2022   BILITOT 0.7 03/02/2022   ALKPHOS 77 03/02/2022   AST 16 03/02/2022   ALT 9 03/02/2022   ANIONGAP 8 03/02/2022    Microbiology:  Serology:  Imaging:   Assessment/plan: Parkinsonism ?uti    5/21 assessment I am not too sure this is uti. I had committed to a therapeutic trial with patient as his sx and history are difficult to discern if it is primary parkinson or 2nd worsening  This time 3 days into abx no improvement. Maybe he is deconditioning We can give him the full course of abx and see (7 days) But in the mean time should also have neurology input and see if further w/u needed for his difficult gait/falls  -continue meropenem for now -please consult neurology -I also sent message to his neurologist dr Hall Busing to update her -discussed with primary team   I spent more than 35 minute reviewing data/chart, and coordinating care and >50% one the phone  with patient providing counseling/discussing diagnostics/treatment plan with patient  Jabier Mutton, [facility] 03/04/2022, 5:51 PM

## 2022-03-04 NOTE — Progress Notes (Signed)
PROGRESS NOTE    [name]  SHF:[id number] DOB: [birth year] DOA: 03/01/2022 PCP: Josetta Huddle, MD  Brief Narrative:  [name] is a 76 y.o. 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  He has had multiple Foley catheters in the past for urinary retention which he does not have it anymore.  Patient had a ESBL UTI in March 2023.  He was treated with ertapenem for 2 weeks.  Patient is on nitrofurantoin for UTI prophylaxis since 02/27/2022.  Wife also reported he got ceftriaxone 2 doses at his PCPs office.  Patient saw infectious disease Dr. Gale Journey yesterday at his office. His wife brought him to the ER as patient had multiple falls generalized weakness decreased p.o. intake and fever with very dark appearing urine. Patient has been followed by neurologist Dr. Carles Collet for Parkinson's disease and she has been adjusting his medications due to increased falls. He denies dysuria suprapubic pain tenderness frequency.  He feels like he empties his bladder completely.  In March while he was in the hospital bladder scan was normal or did not have urinary retention. Past urine cultures ESBL Klebsiella sensitive to meropenem   ED Course: Patient received a dose of meropenem. CT head mild cerebral atrophy CBC CMP essentially normal UA positive for ketones large amount of leukocytes and proteins more than 50 WBC Assessment & Plan:   Principal Problem:   UTI (urinary tract infection)   #1 recurrent urinary tract infection for the past few months-unclear why he gets repeated UTI.  Patient had multiple urinary catheters in and out in the past none for the past few weeks or months.  He has no urinary retention.  Bladder scan here was 0 mL.  urine culture with ESBL Klebsiella pneumonia sensitive to Carbapenem. Appreciate id input Continue meropenem urine culture shows Klebsiella pneumonia ESBL only 20 K Bladder scan with 0  cc Hold Lasix DC IV fluids   #2 Parkinson's disease-.  Continue home meds.   #3 type 2 diabetes continue SSI Restart lantus On Lantus, metformin at home CBG (last 3)  Recent Labs    03/03/22 2139 03/04/22 0734 03/04/22 1132  GLUCAP 154* 150* 245*    #4 hypertension on ACE inhibitor restart Hold Lasix   #5 BPH on Flomax continue    Estimated body mass index is 25.9 kg/m as calculated from the following:   Height as of this encounter: 6' (1.829 m).   Weight as of this encounter: 86.6 kg.  DVT prophylaxis: Lovenox Code Status: Full code Family Communication: Discussed with wife Disposition Plan:  Status is: Inpatient Remains inpatient appropriate because: IV meropenem   Consultants:  Infectious disease  Procedures: None Antimicrobials: Meropenem Subjective:awake alert  Feels better   Objective: Vitals:   03/03/22 0419 03/03/22 1526 03/03/22 2022 03/04/22 0431  BP: (!) 147/93 (!) 143/82 133/74 (!) 142/63  Pulse: (!) 55 63 87 (!) 58  Resp: '18 18 14 14  '$ Temp: 98.2 F (36.8 C) 98.1 F (36.7 C) 98.1 F (36.7 C) 98.7 F (37.1 C)  TempSrc: Oral Oral Oral Oral  SpO2: 98% 100% 99% 100%  Weight:      Height:        Intake/Output Summary (Last 24 hours) at 03/04/2022 1142 Last data filed at 03/04/2022 0733 Gross per 24 hour  Intake 2999.87 ml  Output 850 ml  Net 2149.87 ml    [employer]  03/01/22 0921  Weight: 86.6 kg    Examination:  General exam: Appears more awake alert In nad  Respiratory system: Clear to auscultation. Respiratory effort normal. Cardiovascular system: S1 & S2 heard, RRR. No JVD, murmurs, rubs, gallops or clicks. No pedal edema. Gastrointestinal system: Abdomen is nondistended, soft and nontender. No organomegaly or masses felt. Normal bowel sounds heard. Central nervous system: Alert and oriented. No focal neurological deficits. Extremities: Symmetric 5 x 5 power. Skin: No rashes, lesions or ulcers Psychiatry: Judgement and  insight appear normal. Mood & affect appropriate.     Data Reviewed: I have personally reviewed following labs and imaging studies  CBC: Recent Labs  Lab 03/01/22 1131 03/02/22 0522  WBC 9.6 7.3  NEUTROABS 8.2*  --   HGB 13.9 13.0  HCT 43.2 38.3*  MCV 91.5 90.3  PLT 216 614    Basic Metabolic Panel: Recent Labs  Lab 03/01/22 1131 03/02/22 0522  NA 137 138  K 4.2 3.8  CL 101 105  CO2 26 25  GLUCOSE 209* 175*  BUN 16 16  CREATININE 1.24 1.21  CALCIUM 9.2 8.5*    GFR: Estimated Creatinine Clearance: 57.9 mL/min (by C-G formula based on SCr of 1.21 mg/dL). Liver Function Tests: Recent Labs  Lab 03/01/22 1131 03/02/22 0522  AST 14* 16  ALT <5 9  ALKPHOS 90 77  BILITOT 1.0 0.7  PROT 7.3 6.2*  ALBUMIN 3.5 2.9*    No results for input(s): LIPASE, AMYLASE in the last 168 hours. No results for input(s): AMMONIA in the last 168 hours. Coagulation Profile: Recent Labs  Lab 03/01/22 1131  INR 1.1    Cardiac Enzymes: No results for input(s): CKTOTAL, CKMB, CKMBINDEX, TROPONINI in the last 168 hours. BNP (last 3 results) No results for input(s): PROBNP in the last 8760 hours. HbA1C: No results for input(s): HGBA1C in the last 72 hours. CBG: Recent Labs  Lab 03/03/22 1152 03/03/22 1620 03/03/22 2139 03/04/22 0734 03/04/22 1132  GLUCAP 217* 156* 154* 150* 245*    Lipid Profile: No results for input(s): CHOL, HDL, LDLCALC, TRIG, CHOLHDL, LDLDIRECT in the last 72 hours. Thyroid Function Tests: No results for input(s): TSH, T4TOTAL, FREET4, T3FREE, THYROIDAB in the last 72 hours. Anemia Panel: No results for input(s): VITAMINB12, FOLATE, FERRITIN, TIBC, IRON, RETICCTPCT in the last 72 hours. Sepsis Labs: Recent Labs  Lab 03/01/22 1131 03/01/22 1341  LATICACIDVEN 1.2 1.0     Recent Results (from the past 240 hour(s))  Urine Culture     Status: None   Collection Time: 02/28/22  4:55 AM   Specimen: Urine  Result Value Ref Range Status   MICRO  NUMBER: [id number]  Final   SPECIMEN QUALITY: Adequate  Final   Sample Source URINE  Final   STATUS: FINAL  Final   ISOLATE 1:   Final    Less than 10,000 CFU/mL of single Gram negative organism isolated. No further testing will be performed. If clinically indicated, recollection using a method to minimize contamination, with prompt transfer to Urine Culture Transport Tube, is recommended.  Blood Culture (routine x 2)     Status: None (Preliminary result)   Collection Time: 03/01/22 11:31 AM   Specimen: BLOOD  Result Value Ref Range Status   Specimen Description   Final    BLOOD BLOOD RIGHT FOREARM Performed at [facility] [address], [place], [place] [address]    Special Requests   Final    BOTTLES DRAWN AEROBIC AND ANAEROBIC Blood  Culture results may not be optimal due to an excessive volume of blood received in culture bottles Performed at [facility] [address] [place], [place] [address]    Culture   Final    NO GROWTH 3 DAYS Performed at [facility], [place] [address], [place], [place] [address]    Report Status PENDING  Incomplete  Blood Culture (routine x 2)     Status: None (Preliminary result)   Collection Time: 03/01/22 11:31 AM   Specimen: BLOOD  Result Value Ref Range Status   Specimen Description   Final    BLOOD RIGHT ANTECUBITAL Performed at [facility] [address] [place], [place] [address]    Special Requests   Final    BOTTLES DRAWN AEROBIC AND ANAEROBIC Blood Culture adequate volume Performed at [facility] [address], [place], [place] [address]    Culture   Final    NO GROWTH 3 DAYS Performed at [facility], [place] [address], [place], [place] [address]    Report Status PENDING  Incomplete  Urine Culture     Status: Abnormal   Collection Time: 03/01/22 12:14 PM   Specimen: In/Out Cath Urine  Result Value Ref Range Status   Specimen Description    Final    IN/OUT CATH URINE Performed at [facility] [address], [place], [place] [address]    Special Requests   Final    NONE Performed at [facility], [place] [address] [place], [place] [address]    Culture (A)  Final    2,000 COLONIES/mL KLEBSIELLA PNEUMONIAE Confirmed Extended Spectrum Beta-Lactamase Producer (ESBL).  In bloodstream infections from ESBL organisms, carbapenems are preferred over piperacillin/tazobactam. They are shown to have a lower risk of mortality.    Report Status 03/03/2022 FINAL  Final   Organism ID, Bacteria KLEBSIELLA PNEUMONIAE (A)  Final      Susceptibility   Klebsiella pneumoniae - MIC*    AMPICILLIN >=32 RESISTANT Resistant     CEFAZOLIN >=64 RESISTANT Resistant     CEFEPIME >=32 RESISTANT Resistant     CEFTRIAXONE >=64 RESISTANT Resistant     CIPROFLOXACIN >=4 RESISTANT Resistant     GENTAMICIN >=16 RESISTANT Resistant     IMIPENEM <=0.25 SENSITIVE Sensitive     NITROFURANTOIN 128 RESISTANT Resistant     TRIMETH/SULFA >=320 RESISTANT Resistant     AMPICILLIN/SULBACTAM >=32 RESISTANT Resistant     PIP/TAZO 64 INTERMEDIATE Intermediate     * 2,000 COLONIES/mL KLEBSIELLA PNEUMONIAE          Radiology Studies: No results found.      Scheduled Meds:  carbidopa-levodopa  3 tablet Oral q AM   And   carbidopa-levodopa  2 tablet Oral 5 times per day   Chlorhexidine Gluconate Cloth  6 each Topical Daily   cholecalciferol  2,000 Units Oral Daily   enoxaparin (LOVENOX) injection  40 mg Subcutaneous Q24H   entacapone  200 mg Oral 3 times per day   insulin aspart  0-15 Units Subcutaneous TID WC   insulin aspart  0-5 Units Subcutaneous QHS   insulin glargine-yfgn  10 Units Subcutaneous QHS   lisinopril  5 mg Oral Daily   tamsulosin  0.8 mg Oral Daily   Continuous Infusions:  meropenem (MERREM) IV 1 g (03/04/22 1118)     LOS: 3 days    Time spent: 34 min  Georgette Shell, MD  03/04/2022,  11:42  AM

## 2022-03-04 NOTE — Progress Notes (Signed)
Pharmacy Antibiotic Note  [name] is a 76 y.o. male admitted on 03/01/2022 with ESBL UTI.  Pharmacy has been consulted for meropenem dosing.  Plan: - Meropenem 1 gr IV q8h  - Monitor clinical course, renal function, cultures as available  Height: 6' (182.9 cm) Weight: 86.6 kg (191 lb) IBW/kg (Calculated) : 77.6  Temp (24hrs), Avg:98.3 F (36.8 C), Min:98.1 F (36.7 C), Max:98.7 F (37.1 C)  Recent Labs  Lab 03/01/22 1131 03/01/22 1341 03/02/22 0522  WBC 9.6  --  7.3  CREATININE 1.24  --  1.21  LATICACIDVEN 1.2 1.0  --      Estimated Creatinine Clearance: 57.9 mL/min (by C-G formula based on SCr of 1.21 mg/dL).    Allergies  Allergen Reactions   Oxycodone Itching    This makes him itch   Sudafed [Pseudoephedrine Hcl]     Problems urinating     Antimicrobials this admission:  5/18 meropenem >>   Dose adjustments this admission:    Microbiology results:  5/17 UCx: Less than 10,000 CFU/mL of single Gram negative organism isolated. No further testing will be performed. 5/18 BCx: ngtd 5/18 UCx:  2k ESBL K.pneumonia (pan-resistant except sent imi)    Gretta Arab PharmD, BCPS Clinical Pharmacist [facility] main pharmacy [phone] 03/04/2022 12:12 PM

## 2022-03-05 DIAGNOSIS — N390 Urinary tract infection, site not specified: Secondary | ICD-10-CM | POA: Diagnosis not present

## 2022-03-05 LAB — GLUCOSE, CAPILLARY
Glucose-Capillary: 143 mg/dL — ABNORMAL HIGH (ref 70–99)
Glucose-Capillary: 269 mg/dL — ABNORMAL HIGH (ref 70–99)
Glucose-Capillary: 166 mg/dL — ABNORMAL HIGH (ref 70–99)
Glucose-Capillary: 138 mg/dL — ABNORMAL HIGH (ref 70–99)

## 2022-03-05 MED ORDER — FUROSEMIDE 10 MG/ML IJ SOLN
20.0000 mg | Freq: Once | INTRAMUSCULAR | Status: AC
  Administered 2022-03-05: 20 mg via INTRAVENOUS
  Filled 2022-03-05: qty 2

## 2022-03-05 MED ORDER — POTASSIUM CHLORIDE CRYS ER 20 MEQ PO TBCR
20.0000 meq | EXTENDED_RELEASE_TABLET | Freq: Once | ORAL | Status: AC
  Administered 2022-03-05: 20 meq via ORAL
  Filled 2022-03-05: qty 1

## 2022-03-05 MED ORDER — SODIUM CHLORIDE 0.9 % IV SOLN
1.0000 g | Freq: Once | INTRAVENOUS | Status: AC
  Administered 2022-03-07: 1000 mg via INTRAVENOUS
  Filled 2022-03-05: qty 1

## 2022-03-05 NOTE — Progress Notes (Signed)
Chaplain engaged in an initial visit with Talan's wife, Izora Gala, as Chou engaged in [place].  Izora Gala shared about Keyaan's healthcare journey, their family, and relationship.  She is Systems developer caregiver and they have a great support system.  They have three boys and one girl.  Their sons live close by and provide help as needed.  Izora Gala also voiced that she and Godson have been married almost 62 years.  They met through her mother.  She talked about having some really good times together even in the midst of Rajendra having Parkinson's and hospitalizations this year.    Chaplain offered rapport through building relationship, offered reflective listening, and support.     03/05/22 1000  Clinical Encounter Type  Visited With Family  Visit Type Initial

## 2022-03-05 NOTE — Progress Notes (Signed)
[facility] Inpatient progress note  Cc - falling/poor gait, uti  Patient Active Problem List   Diagnosis Date Noted   UTI (urinary tract infection) 03/01/2022   Leukocytosis 01/01/2022   Hyponatremia 01/01/2022   CKD (chronic kidney disease), stage III ([place]) 01/01/2022   HTN (hypertension) 12/31/2021   Urinary tract infection due to ESBL Klebsiella 12/31/2021   Generalized weakness 12/31/2021   Chronic venous insufficiency 08/29/2021   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   Encounter for general adult medical examination with abnormal findings 02/18/2018   Enlarged prostate 02/18/2018   Hearing loss 02/18/2018   Other long term (current) drug therapy 02/18/2018   Type 2 diabetes mellitus with hyperglycemia ([place]) 02/18/2018   HLD (hyperlipidemia)    Nephrolithiasis    Parkinson's disease ([place])    Diabetes mellitus with coincident hypertension ([place])    Syncope    Renal calculus    Diabetes ([place])    Allergic rhinitis    BPH (benign prostatic hyperplasia)    Erectile dysfunction    Dysphagia, pharyngeal phase    Edema    Ureteral calculus 02/09/2013   LEG PAIN 03/14/2010      HPI/subjective:  [name] is a 76 y.o. 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 Patient's wife dropped off a urine sample a few days prior to this visit of 02/28/22 asking urology to evaluate as patient has become weaker with more falls concerning for a uti 2) his neurologist has been adjusting parkinson's meds but is wondering if it is due to secondary causes making his parkinsonism  temporarily worse rather than primary progression of his parkinsonism  She last saw his urologist on 01/03/22 during admission 3/19-24 for esbl uti. He had 4 days progressive malaise/generalized weakness and was admitted to get meropnem --> ertapenem for 2 weeks. On follow up 3/22 he was feeling better.  He is currently on nitrofurantoin for prophylaxis, since 5/16. He is getting ceftriaxone as well at the [facility] physician's at [facility].    I reviewed his previous urine culture on epic (last one I could see was 12/31/21) 12/31/21 kleb pna pan-resistant except imipenem, but only 20k colonies 06/2021 citrobacter freundii (S cipro/bactrim/gentamycin/nitrofurantoin) and kleb pna (S imipenem but resistant to cipro/bactrim)   Care everywhere labs: 01/31/22 ucx >100k colonies/ml of kleb pna esbl (R cipro, bactrim; s ertapenem)   He has had about 3-4 episodes of these increased parkinsonism the last 2 years and all got better with abx treatment. He doesn't take over the counter medication intermittently like benadryl that could worsen urinary retention  He denies other sx of typical uti outside of the weakness/malaise and increased parkinsonism (which is mainly more falls and shuffling gait and unsteady). He hasn't had a time of these episode where he clinically watch and improve  He mentions over the last 2 years his parkinson also gets gradually worse  Since his last episode 12/31/21 uti, he had 4-5 good week baseline sx and the last several days have had worsening parkinsonism and weakness/malaise.   He is using a fww  now which is very bad for him.  He hasn't felt better since nitrofurantoin and ceftriaxone yet.   He has not had no prostate sling procedure He last had a foley catheter in 12/2021  Normally he hasn't had to straight cath himself    Meds: Sinemet 25-100 3 tablet in morning, and 2 tablet q2hours Entacapone 200 mg tid ropinarole Lantus Acetaminophen Nitrofurantoin  5/16-c Metformin Lisinopril Omeprazole Tamsulosin Furosemide Simvastatin   ------- 5/21 inpatient rounding I spoke with patient and his wife He was admitted briefly after clinic visit. He reported falling and not able to function He was started on empiric meropenem Ucx grew <100k kleb pna esbl  3 days into abx there is no improvement  I discussed with them that 6 weeks ago with abx patient was able to function (do pt/ot) out of bed in 2-3 days   No fever, chill  5/22 inpatient rounding Today his wife and patient reports improvement of gait/strength, beginning to work with PT/OT better. Mentions today he actually has been out of bed to his chair everyday  No f/c Ucx reviewed again No n/v/diarrhea No dysuria/suprapubic pain No rash   Review of Systems: ROS All other ros negative      Past Medical History:  Diagnosis Date   Abnormal involuntary movement 02/18/2018   Allergic rhinitis    Arthritis    BPH (benign prostatic hyperplasia)    Diabetes ([place])    Dysphagia, pharyngeal phase    GERD diagnosed on barium swallow. Has small hiatal hernia. Symptomatically somewhat better on omeprazole but not entirely. We'll try b.i.d. therapy   Edema    1+ in both ankles, likely multifactorial including medication such as Requip   Erectile dysfunction    Staxyn 10 mg or Viagra worked well. 3 samples of Cialis 20 mg provided   GERD (gastroesophageal reflux disease)    Hypercholesteremia    Hypertension    Nephrolithiasis    Onychomycosis of toenail    April 27, 2013 - Dr. Inocencio Homes - podiatry, was in [place] - treating with oral Lamisil and topical nail therapy   Parkinson's disease [facility])     followed by Dr. Lezlie Octave at [facility] and [name], M.D. in [place]   Presbycusis    and tinnitus - Dr. Izora Gala - August/2013   Renal calculus    Syncope     Social History   Tobacco Use   Smoking status: Never   Smokeless tobacco: Never  Vaping Use   Vaping Use:  Never used  Substance Use Topics   Alcohol use: No   Drug use: No    Family History  Problem Relation Age of Onset   Atrial fibrillation Mother    Breast cancer Mother    Emphysema Father    Heart disease Father    Cancer Brother        African [place]    Allergies  Allergen Reactions   Oxycodone Itching    This makes him itch   Sudafed [Pseudoephedrine Hcl]     Problems urinating     OBJECTIVE: Vitals:   03/04/22 0431 03/04/22 1537 03/04/22 2034 03/05/22 0639  BP: (!) 142/63 123/63 105/90 (!) 164/92  Pulse: (!) 58 75 72 79  Resp: '14 19 18 18  '$ Temp: 98.7 F (37.1 C) (!) 97.4 F (36.3 C) 98.8 F (37.1 C)   TempSrc: Oral Oral Oral   SpO2: 100% 96% 100% 98%  Weight:      Height:  Body mass index is 25.9 kg/m.   Physical Exam General/constitutional: no distress, pleasant, fully conversant with good even phonation and facial expression HEENT: Normocephalic, PER, Conj Clear, EOMI, Oropharynx clear Neck supple CV: rrr no mrg Lungs: clear to auscultation, normal respiratory effort Abd: Soft, Nontender Ext: no edema Skin: No Rash Neuro: no resting tremor, rigidity; LE strength 4-5/5 symmetric; didn't test gait     Lab: Lab Results  Component Value Date   WBC 7.3 03/02/2022   HGB 13.0 03/02/2022   HCT 38.3 (L) 03/02/2022   MCV 90.3 03/02/2022   PLT 208 48/18/5631   Last metabolic panel Lab Results  Component Value Date   GLUCOSE 175 (H) 03/02/2022   NA 138 03/02/2022   K 3.8 03/02/2022   CL 105 03/02/2022   CO2 25 03/02/2022   BUN 16 03/02/2022   CREATININE 1.21 03/02/2022   GFRNONAA >60 03/02/2022   CALCIUM 8.5 (L) 03/02/2022   PROT 6.2 (L) 03/02/2022   ALBUMIN 2.9 (L) 03/02/2022   BILITOT 0.7 03/02/2022   ALKPHOS 77 03/02/2022   AST 16 03/02/2022   ALT 9 03/02/2022   ANIONGAP 8 03/02/2022    Microbiology:  Serology:  Imaging:   Assessment/plan: Parkinsonism ?uti    5/21 inpatient assessment I am not too sure this is  uti. I had committed to a therapeutic trial with patient as his sx and history are difficult to discern if it is primary parkinson or 2nd worsening  This time 3 days into abx no improvement. Maybe he is deconditioning We can give him the full course of abx and see (7 days) But in the mean time should also have neurology input and see if further w/u needed for his difficult gait/falls  5/22 inpatient assessment Both patient and his wife reported clinical improvement I express empathy in them acknowledging sometimes dx of uti rather difficult. I also relate that at this time still in diagnostic mode and should give this another week or two to see how his gait is doing after a full course of presumed UTI treatment They are agreeable I also advise them on discharge to follow up with neurology I updated dr [name] his neurologist and discussed case with Dr Rodena Piety of his primary team  -continue meropenem; on Wednesday this week to give ertapenem to finish 7 day planned uti treatment -pt/ot -follow up with dr [name] in a few week -I will seem him again to assess for continued clinical improvement and arrange ID outpatient follow up   I spent more than 50 minute reviewing data/chart, and coordinating care and >50% direct face to face time providing counseling/discussing diagnostics/treatment plan with patient   Jabier Mutton, [facility] 03/05/2022, 1:22 PM

## 2022-03-05 NOTE — Progress Notes (Signed)
PROGRESS NOTE    [name]  ZWC:[id number] DOB: [birth year] DOA: 03/01/2022 PCP: Josetta Huddle, MD  Brief Narrative:  [name] is a 76 y.o. 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  He has had multiple Foley catheters in the past for urinary retention which he does not have it anymore.  Patient had a ESBL UTI in March 2023.  He was treated with ertapenem for 2 weeks.  Patient is on nitrofurantoin for UTI prophylaxis since 02/27/2022.  Wife also reported he got ceftriaxone 2 doses at his PCPs office.  Patient saw infectious disease Dr. Gale Journey yesterday at his office. His wife brought him to the ER as patient had multiple falls generalized weakness decreased p.o. intake and fever with very dark appearing urine. Patient has been followed by neurologist Dr. Carles Collet for Parkinson's disease and she has been adjusting his medications due to increased falls. He denies dysuria suprapubic pain tenderness frequency.  He feels like he empties his bladder completely.  In March while he was in the hospital bladder scan was normal or did not have urinary retention. Past urine cultures ESBL Klebsiella sensitive to meropenem Patient is on Lasix 60 mg twice daily at home which has not been restarted during this hospital stay.  He has been receiving as needed Lasix.   ED Course: Patient received a dose of meropenem. CT head mild cerebral atrophy CBC CMP essentially normal UA positive for ketones large amount of leukocytes and proteins more than 50 WBC Assessment & Plan:   Principal Problem:   UTI (urinary tract infection) Active Problems:   HLD (hyperlipidemia)   Parkinson's disease ([facility])   Diabetes mellitus with coincident hypertension ([facility])   BPH (benign prostatic hyperplasia)   #1 recurrent urinary tract infection for the past few months-unclear why he gets repeated UTI.  Patient had multiple urinary catheters in  and out in the past none for the past few weeks or months.  He has no urinary retention.  Bladder scan here was 0 mL.  urine culture with ESBL Klebsiella pneumonia only 2000 colonies sensitive to Carbapenem. Appreciate id input ID recommends to continue meropenem today and give a dose of ertapenem 03/07/2022 and then discharge him home to finish a course of 7 days. Bladder scan with 0 cc  #2 Parkinson's disease-.  Continue home meds.   #3 type 2 diabetes continue SSI Restart lantus On Lantus, metformin at home CBG (last 3)  Recent Labs    03/05/22 2158 03/06/22 0745 03/06/22 1155  GLUCAP 138* 169* 165*   #4 hypertension on ACE inhibitor restarted Hold Lasix   #5 BPH on Flomax continue    Estimated body mass index is 25.9 kg/m as calculated from the following:   Height as of this encounter: 6' (1.829 m).   Weight as of this encounter: 86.6 kg.  DVT prophylaxis: Lovenox Code Status: Full code Family Communication: Discussed with wife Disposition Plan:  Status is: Inpatient Remains inpatient appropriate because: IV meropenem   Consultants:  Infectious disease  Procedures: None Antimicrobials: Meropenem Subjective:   Patient resting in bed No new complaints Has a condom cath in place Ambulated with PT yesterday  Objective: Vitals:   03/06/22 0527 03/06/22 1238 03/06/22 1241 03/06/22 1245  BP: 140/65 116/89 (!) 154/76 (!) 145/79  Pulse: 70 (!) 58 65 73  Resp: 18     Temp: 98 F (36.7 C)  98.5 F (36.9 C)  TempSrc: Oral   Oral  SpO2: 97% 93% 97% 98%  Weight:      Height:        Intake/Output Summary (Last 24 hours) at 03/06/2022 1249 Last data filed at 03/06/2022 2671 Gross per 24 hour  Intake 360 ml  Output 1200 ml  Net -840 ml   Filed Weights   03/01/22 0921  Weight: 86.6 kg    Examination:  General exam: Appears more awake alert In nad  Respiratory system: Clear to auscultation. Respiratory effort normal. Cardiovascular system: S1 & S2  heard, RRR. No JVD, murmurs, rubs, gallops or clicks.  Trace pedal edema Gastrointestinal system: Abdomen is nondistended, soft and nontender. No organomegaly or masses felt. Normal bowel sounds heard. Central nervous system: Alert and oriented. No focal neurological deficits. Extremities: Symmetric 5 x 5 power. Skin: No rashes, lesions or ulcers Psychiatry: Judgement and insight appear normal. Mood & affect appropriate.     Data Reviewed: I have personally reviewed following labs and imaging studies  CBC: Recent Labs  Lab 03/01/22 1131 03/02/22 0522 03/06/22 0736  WBC 9.6 7.3 6.0  NEUTROABS 8.2*  --   --   HGB 13.9 13.0 13.5  HCT 43.2 38.3* 40.6  MCV 91.5 90.3 89.4  PLT 216 208 245   Basic Metabolic Panel: Recent Labs  Lab 03/01/22 1131 03/02/22 0522 03/06/22 0736  NA 137 138 137  K 4.2 3.8 3.8  CL 101 105 105  CO2 '26 25 26  '$ GLUCOSE 209* 175* 169*  BUN '16 16 18  '$ CREATININE 1.24 1.21 0.98  CALCIUM 9.2 8.5* 8.7*   GFR: Estimated Creatinine Clearance: 71.5 mL/min (by C-G formula based on SCr of 0.98 mg/dL). Liver Function Tests: Recent Labs  Lab 03/01/22 1131 03/02/22 0522 03/06/22 0736  AST 14* 16 18  ALT <5 9 <5  ALKPHOS 90 77 81  BILITOT 1.0 0.7 0.9  PROT 7.3 6.2* 6.1*  ALBUMIN 3.5 2.9* 2.9*   No results for input(s): LIPASE, AMYLASE in the last 168 hours. No results for input(s): AMMONIA in the last 168 hours. Coagulation Profile: Recent Labs  Lab 03/01/22 1131  INR 1.1   Cardiac Enzymes: No results for input(s): CKTOTAL, CKMB, CKMBINDEX, TROPONINI in the last 168 hours. BNP (last 3 results) No results for input(s): PROBNP in the last 8760 hours. HbA1C: No results for input(s): HGBA1C in the last 72 hours. CBG: Recent Labs  Lab 03/05/22 1227 03/05/22 1618 03/05/22 2158 03/06/22 0745 03/06/22 1155  GLUCAP 269* 166* 138* 169* 165*   Lipid Profile: No results for input(s): CHOL, HDL, LDLCALC, TRIG, CHOLHDL, LDLDIRECT in the last 72  hours. Thyroid Function Tests: No results for input(s): TSH, T4TOTAL, FREET4, T3FREE, THYROIDAB in the last 72 hours. Anemia Panel: Recent Labs    03/04/22 1537  VITAMINB12 889   Sepsis Labs: Recent Labs  Lab 03/01/22 1131 03/01/22 1341  LATICACIDVEN 1.2 1.0    Recent Results (from the past 240 hour(s))  Urine Culture     Status: None   Collection Time: 02/28/22  4:55 AM   Specimen: Urine  Result Value Ref Range Status   MICRO NUMBER: [id number]  Final   SPECIMEN QUALITY: Adequate  Final   Sample Source URINE  Final   STATUS: FINAL  Final   ISOLATE 1:   Final    Less than 10,000 CFU/mL of single Gram negative organism isolated. No further testing will be performed. If clinically indicated, recollection using a method to  minimize contamination, with prompt transfer to Urine Culture Transport Tube, is recommended.  Blood Culture (routine x 2)     Status: None   Collection Time: 03/01/22 11:31 AM   Specimen: BLOOD  Result Value Ref Range Status   Specimen Description   Final    BLOOD BLOOD RIGHT FOREARM Performed at [facility] [address], [place], [place] [address]    Special Requests   Final    BOTTLES DRAWN AEROBIC AND ANAEROBIC Blood Culture results may not be optimal due to an excessive volume of blood received in culture bottles Performed at [facility] [address], [place], [place] [address]    Culture   Final    NO GROWTH 5 DAYS Performed at [facility], [place] [address] [place], [place] [address]    Report Status 03/06/2022 FINAL  Final  Blood Culture (routine x 2)     Status: None   Collection Time: 03/01/22 11:31 AM   Specimen: BLOOD  Result Value Ref Range Status   Specimen Description   Final    BLOOD RIGHT ANTECUBITAL Performed at [facility] [address] [place], [place] [address]    Special Requests   Final    BOTTLES DRAWN AEROBIC AND ANAEROBIC Blood Culture adequate  volume Performed at [facility] [address] [place], [place] [address]    Culture   Final    NO GROWTH 5 DAYS Performed at [facility], [place] [address] [place], [place] [address]    Report Status 03/06/2022 FINAL  Final  Urine Culture     Status: Abnormal   Collection Time: 03/01/22 12:14 PM   Specimen: In/Out Cath Urine  Result Value Ref Range Status   Specimen Description   Final    IN/OUT CATH URINE Performed at [facility] [address], [place], [place] [address]    Special Requests   Final    NONE Performed at [facility], [place] [address] [place], [place] [address]    Culture (A)  Final    2,000 COLONIES/mL KLEBSIELLA PNEUMONIAE Confirmed Extended Spectrum Beta-Lactamase Producer (ESBL).  In bloodstream infections from ESBL organisms, carbapenems are preferred over piperacillin/tazobactam. They are shown to have a lower risk of mortality.    Report Status 03/03/2022 FINAL  Final   Organism ID, Bacteria KLEBSIELLA PNEUMONIAE (A)  Final      Susceptibility   Klebsiella pneumoniae - MIC*    AMPICILLIN >=32 RESISTANT Resistant     CEFAZOLIN >=64 RESISTANT Resistant     CEFEPIME >=32 RESISTANT Resistant     CEFTRIAXONE >=64 RESISTANT Resistant     CIPROFLOXACIN >=4 RESISTANT Resistant     GENTAMICIN >=16 RESISTANT Resistant     IMIPENEM <=0.25 SENSITIVE Sensitive     NITROFURANTOIN 128 RESISTANT Resistant     TRIMETH/SULFA >=320 RESISTANT Resistant     AMPICILLIN/SULBACTAM >=32 RESISTANT Resistant     PIP/TAZO 64 INTERMEDIATE Intermediate     * 2,000 COLONIES/mL KLEBSIELLA PNEUMONIAE          Radiology Studies: No results found.      Scheduled Meds:  carbidopa-levodopa  3 tablet Oral q AM   And   carbidopa-levodopa  2 tablet Oral 5 times per day   cholecalciferol  2,000 Units Oral Daily   enoxaparin (LOVENOX) injection  40 mg Subcutaneous Q24H   entacapone  200 mg Oral 3 times per  day   insulin  aspart  0-15 Units Subcutaneous TID WC   insulin aspart  0-5 Units Subcutaneous QHS   insulin glargine-yfgn  10 Units Subcutaneous QHS   lisinopril  5 mg Oral Daily   tamsulosin  0.8 mg Oral Daily   Continuous Infusions:  [START ON 03/07/2022] ertapenem     meropenem (MERREM) IV 1 g (03/06/22 1231)     LOS: 5 days    Time spent: 34 min  Georgette Shell, MD  03/06/2022, 12:49 PM

## 2022-03-05 NOTE — Evaluation (Signed)
Physical Therapy Evaluation Patient Details [name] MRN: [id number] DOB: [birth year] Today's Date: 03/05/2022  History of Present Illness  76 yo male admitted with UTI, weakness, falls. Hx of Parkinson's, chronic bil LE edema, syncope, TKA, recurrent UTI, ESBL, hernia repair, DM  Clinical Impression  On eval, pt required Min A for mobility. He walked ~300 feet with a RW. He exhibits typical Parkinson's disease signs: shuffle gait, festination when mobilizing. He is unsafe at times and at risk for further falls. Discussed f/u PT-pt has some reservations from prior experiences. Based on today's performance, he could potentially benefit from OP PT f/u. Will plan to follow pt during hospital stay.      Recommendations for follow up therapy are one component of a multi-disciplinary discharge planning process, led by the attending physician.  Recommendations may be updated based on patient status, additional functional criteria and insurance authorization.  Follow Up Recommendations Outpatient PT (OP PT would be ideal-if pt will agree to it)    Assistance Recommended at Discharge PRN  Patient can return home with the following  A little help with walking and/or transfers;A little help with bathing/dressing/bathroom;Help with stairs or ramp for entrance    Equipment Recommendations None recommended by PT  Recommendations for Other Services       Functional Status Assessment Patient has had a recent decline in their functional status and demonstrates the ability to make significant improvements in function in a reasonable and predictable amount of time.     Precautions / Restrictions Precautions Precautions: Fall Restrictions Weight Bearing Restrictions: No      Mobility  Bed Mobility               General bed mobility comments: oob in recliner    Transfers Overall transfer level: Needs assistance Equipment used: Rolling walker (2 wheels) Transfers: Sit to/from  Stand Sit to Stand: Min assist           General transfer comment: MIn A due to pt attempting to sit before safely positioned.    Ambulation/Gait Ambulation/Gait assistance: Min assist Gait Distance (Feet): 300 Feet Assistive device: Rolling walker (2 wheels) Gait Pattern/deviations: Step-through pattern, Festinating, Shuffle       General Gait Details: Intermittent festination/shuffling throughout gait. Cues for step length, RW proximity. Tolerated distance well.  Stairs            Wheelchair Mobility    Modified Rankin (Stroke Patients Only)       Balance Overall balance assessment: Needs assistance         Standing balance support: Bilateral upper extremity supported, During functional activity, Reliant on assistive device for balance Standing balance-Leahy Scale: Poor                               Pertinent Vitals/Pain Pain Assessment Pain Assessment: No/denies pain    Home Living Family/patient expects to be discharged to:: Private residence Living Arrangements: Spouse/significant other Available Help at Discharge: Available PRN/intermittently;Family Type of Home: House Home Access: Stairs to enter       Home Layout: Able to live on main level with bedroom/bathroom;Laundry or work area in [place]: Kasandra Knudsen - single point;BSC/3in1;Shower seat - built in;Shower seat      Prior Function Prior Level of Function : Independent/Modified Independent             Mobility Comments: uses cane PRN; drives       Hand  Dominance        Extremity/Trunk Assessment   Upper Extremity Assessment Upper Extremity Assessment: Overall WFL for tasks assessed    Lower Extremity Assessment Lower Extremity Assessment: Generalized weakness (impaired coordination 2* Parkinson's)    Cervical / Trunk Assessment Cervical / Trunk Assessment: Normal  Communication   Communication: No difficulties  Cognition Arousal/Alertness:  Awake/alert Behavior During Therapy: WFL for tasks assessed/performed Overall Cognitive Status: Within Functional Limits for tasks assessed                                          General Comments      Exercises     Assessment/Plan    PT Assessment Patient needs continued PT services  PT Problem List Decreased coordination;Decreased balance;Decreased knowledge of use of DME;Decreased safety awareness;Decreased mobility       PT Treatment Interventions DME instruction;Gait training;Patient/family education;Therapeutic exercise;Balance training;Functional mobility training    PT Goals (Current goals can be found in the Care Plan section)  Acute Rehab PT Goals Patient Stated Goal: home. regain PLOF/independence PT Goal Formulation: With patient/family Time For Goal Achievement: 03/19/22 Potential to Achieve Goals: Good    Frequency Min 3X/week     Co-evaluation               AM-PAC PT "6 Clicks" Mobility  Outcome Measure Help needed turning from your back to your side while in a flat bed without using bedrails?: A Little Help needed moving from lying on your back to sitting on the side of a flat bed without using bedrails?: A Little Help needed moving to and from a bed to a chair (including a wheelchair)?: A Little Help needed standing up from a chair using your arms (e.g., wheelchair or bedside chair)?: A Little Help needed to walk in hospital room?: A Little Help needed climbing 3-5 steps with a railing? : A Little 6 Click Score: 18    End of Session Equipment Utilized During Treatment: Gait belt Activity Tolerance: Patient tolerated treatment well Patient left: in chair;with call bell/phone within reach   PT Visit Diagnosis: History of falling (Z91.81);Repeated falls (R29.6);Difficulty in walking, not elsewhere classified (R26.2);Other symptoms and signs involving the nervous system (R29.898)    Time: 1020-1035 PT Time Calculation (min)  (ACUTE ONLY): 15 min   Charges:   PT Evaluation $PT Eval Moderate Complexity: [place], PT Acute Rehabilitation  Office: [phone] Pager: [phone]

## 2022-03-05 NOTE — Evaluation (Signed)
Occupational Therapy Evaluation Patient Details [name] MRN: [id number] DOB: 1946/03/13 Today's Date: 03/05/2022   History of Present Illness 76 yo male admitted with UTI, weakness, falls. Hx of Parkinson's, chronic bil LE edema, syncope, TKA, recurrent UTI, ESBL, hernia repair, DM   Clinical Impression   Patient is a 76 year old male who was admitted for above. Patient was noted to have an increase in incontinence episodes and use of antibacterial soap in perineal area impacting urinary continence. Patient was educated on strategies to maintain urinary continence in the hospital and during transition home. Patient verbalized understanding. Patient was noted to have decreased functional activity tolerance, decreased standing balance and decreased knowledge in urinary continence impacting participation in ADLs. Patient would continue to benefit from skilled OT services at this time while admitted and after d/c to address noted deficits in order to improve overall safety and independence in ADLs.       Recommendations for follow up therapy are one component of a multi-disciplinary discharge planning process, led by the attending physician.  Recommendations may be updated based on patient status, additional functional criteria and insurance authorization.   Follow Up Recommendations  No OT follow up    Assistance Recommended at Discharge Frequent or constant Supervision/Assistance  Patient can return home with the following A little help with walking and/or transfers;A little help with bathing/dressing/bathroom;Assistance with cooking/housework;Direct supervision/assist for financial management;Help with stairs or ramp for entrance;Direct supervision/assist for medications management;Assist for transportation    Functional Status Assessment  Patient has had a recent decline in their functional status and demonstrates the ability to make significant improvements in function in a  reasonable and predictable amount of time.  Equipment Recommendations  None recommended by OT    Recommendations for Other Services       Precautions / Restrictions Precautions Precautions: Fall Restrictions Weight Bearing Restrictions: No      Mobility Bed Mobility               General bed mobility comments: oob in recliner    Transfers                          Balance Overall balance assessment: Needs assistance Sitting-balance support: No upper extremity supported, Feet supported Sitting balance-Leahy Scale: Fair     Standing balance support: Bilateral upper extremity supported, During functional activity, Reliant on assistive device for balance Standing balance-Leahy Scale: Poor Standing balance comment: noted to stop to reach out of base of support even with education provided to get closer first.                           ADL either performed or assessed with clinical judgement   ADL Overall ADL's : Needs assistance/impaired Eating/Feeding: Set up;Sitting   Grooming: Set up;Sitting   Upper Body Bathing: Set up;Sitting   Lower Body Bathing: Minimal assistance;Sit to/from stand   Upper Body Dressing : Set up;Sitting   Lower Body Dressing: Minimal assistance;Sit to/from stand Lower Body Dressing Details (indicate cue type and reason): patien was able to don/doff socks sitting in recliner. patient was noted to have had some leakage in recliner with patietn educated on urinery hygiene with going to bathrooom every 2 hours, importance of staying dry to prevent UTI and not using antibacterial soap in perinal areas. patient verbalized understanding. patient reported liking dial soap smell. patient was educated that there are soaps they make  that are not antibacterial. Toilet Transfer: Minimal assistance;Rolling walker (2 wheels);Regular Toilet;Ambulation Toilet Transfer Details (indicate cue type and reason): with increased time with noted  stopping and starting parkinsonism movements during functional mobility in room. Toileting- Clothing Manipulation and Hygiene: Minimal assistance;Sit to/from stand Toileting - Clothing Manipulation Details (indicate cue type and reason): patient was educated on incontinence liners for men and other strategies for incontinence. patient verbalized understanding     Functional mobility during ADLs: Minimal assistance;Rolling walker (2 wheels)       Vision Baseline Vision/History: 1 Wears glasses Patient Visual Report: No change from baseline       Perception     Praxis      Pertinent Vitals/Pain Pain Assessment Pain Assessment: No/denies pain     Hand Dominance Right   Extremity/Trunk Assessment Upper Extremity Assessment Upper Extremity Assessment: Overall WFL for tasks assessed   Lower Extremity Assessment Lower Extremity Assessment: Defer to PT evaluation   Cervical / Trunk Assessment Cervical / Trunk Assessment: Normal   Communication Communication Communication: No difficulties   Cognition Arousal/Alertness: Awake/alert Behavior During Therapy: WFL for tasks assessed/performed Overall Cognitive Status: Within Functional Limits for tasks assessed                                 General Comments: noted to have poor awareness to incontinence episode with urine. otherwise appropriate with session     General Comments       Exercises     Shoulder Instructions      Home Living Family/patient expects to be discharged to:: Private residence Living Arrangements: Spouse/significant other Available Help at Discharge: Available PRN/intermittently;Family Type of Home: House Home Access: Stairs to enter [employer] of Steps: 5 steps   Home Layout: Able to live on main level with bedroom/bathroom;Laundry or work area in basement     [employer] Shower/Tub: [place]: Kasandra Knudsen - single Water quality scientist -  built in;Shower seat          Prior Functioning/Environment Prior Level of Function : Independent/Modified Independent             Mobility Comments: uses cane PRN; drives ADLs Comments: still working        OT Problem List: Impaired balance (sitting and/or standing);Decreased safety awareness;Decreased knowledge of precautions;Decreased knowledge of use of DME or AE      OT Treatment/Interventions: Self-care/ADL training;Therapeutic exercise;Neuromuscular education;Energy conservation;DME and/or AE instruction;Therapeutic activities;Patient/family education;Balance training    OT Goals(Current goals can be found in the care plan section) Acute Rehab OT Goals Patient Stated Goal: to get UTIs under control OT Goal Formulation: With patient Time For Goal Achievement: 03/19/22 Potential to Achieve Goals: Good  OT Frequency: Min 2X/week    Co-evaluation              AM-PAC OT "6 Clicks" Daily Activity     Outcome Measure Help from another person eating meals?: None Help from another person taking care of personal grooming?: A Little Help from another person toileting, which includes using toliet, bedpan, or urinal?: A Little Help from another person bathing (including washing, rinsing, drying)?: A Little Help from another person to put on and taking off regular upper body clothing?: A Little Help from another person to put on and taking off regular lower body clothing?: A Little 6 Click Score: 19   End of Session Equipment Utilized During  Treatment: Gait belt;Rolling walker (2 wheels) Nurse Communication: Other (comment) (incontinence episode and education provided.)  Activity Tolerance: Patient tolerated treatment well Patient left: in chair;with call bell/phone within reach;with chair alarm set  OT Visit Diagnosis: Unsteadiness on feet (R26.81);Muscle weakness (generalized) (M62.81)                Time: 3329-5188 OT Time Calculation (min): 37 min Charges:  OT  General Charges $OT Visit: 1 Visit OT Evaluation $OT Eval Moderate Complexity: 1 Mod OT Treatments $Self Care/Home Management : 8-22 mins  Jackelyn Poling OTR/L, MS Acute Rehabilitation Department Office# [phone] Pager# [phone]   Marcellina Millin 03/05/2022, 3:01 PM

## 2022-03-06 DIAGNOSIS — N390 Urinary tract infection, site not specified: Secondary | ICD-10-CM | POA: Diagnosis not present

## 2022-03-06 LAB — GLUCOSE, CAPILLARY
Glucose-Capillary: 169 mg/dL — ABNORMAL HIGH (ref 70–99)
Glucose-Capillary: 165 mg/dL — ABNORMAL HIGH (ref 70–99)
Glucose-Capillary: 270 mg/dL — ABNORMAL HIGH (ref 70–99)
Glucose-Capillary: 269 mg/dL — ABNORMAL HIGH (ref 70–99)

## 2022-03-06 LAB — COMPREHENSIVE METABOLIC PANEL
ALT: <5 U/L (ref 0–44)
AST: 18 U/L (ref 15–41)
Albumin: 2.9 g/dL — ABNORMAL LOW (ref 3.5–5.0)
Alkaline Phosphatase: 81 U/L (ref 38–126)
Anion gap: 6 (ref 5–15)
BUN: 18 mg/dL (ref 8–23)
CO2: 26 mmol/L (ref 22–32)
Calcium: 8.7 mg/dL — ABNORMAL LOW (ref 8.9–10.3)
Chloride: 105 mmol/L (ref 98–111)
Creatinine, Ser: 0.98 mg/dL (ref 0.61–1.24)
GFR, Estimated: >60 mL/min (ref >60)
Glucose, Bld: 169 mg/dL — ABNORMAL HIGH (ref 70–99)
Potassium: 3.8 mmol/L (ref 3.5–5.1)
Sodium: 137 mmol/L (ref 135–145)
Total Bilirubin: 0.9 mg/dL (ref 0.3–1.2)
Total Protein: 6.1 g/dL — ABNORMAL LOW (ref 6.5–8.1)

## 2022-03-06 LAB — CULTURE, BLOOD (ROUTINE X 2)
Culture: NO GROWTH
Culture: NO GROWTH
Special Requests: ADEQUATE

## 2022-03-06 LAB — CBC
HCT: 40.6 % (ref 39.0–52.0)
Hemoglobin: 13.5 g/dL (ref 13.0–17.0)
MCH: 29.7 pg (ref 26.0–34.0)
MCHC: 33.3 g/dL (ref 30.0–36.0)
MCV: 89.4 fL (ref 80.0–100.0)
Platelets: 283 10*3/uL (ref 150–400)
RBC: 4.54 MIL/uL (ref 4.22–5.81)
RDW: 14 % (ref 11.5–15.5)
WBC: 6 10*3/uL (ref 4.0–10.5)
nRBC: 0 % (ref 0.0–0.2)

## 2022-03-06 MED ORDER — LIVING WELL WITH DIABETES BOOK
Freq: Once | Status: AC
  Filled 2022-03-06: qty 1

## 2022-03-06 NOTE — Plan of Care (Signed)

## 2022-03-06 NOTE — Progress Notes (Signed)
PROGRESS NOTE    [name]  TWS:[id number] DOB: Sep 19, 1946 DOA: 03/01/2022 PCP: Josetta Huddle, MD  Brief Narrative:  [name] is a 76 y.o. 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  He has had multiple Foley catheters in the past for urinary retention which he does not have it anymore.  Patient had a ESBL UTI in March 2023.  He was treated with ertapenem for 2 weeks.  Patient is on nitrofurantoin for UTI prophylaxis since 02/27/2022.  Wife also reported he got ceftriaxone 2 doses at his PCPs office.  Patient saw infectious disease Dr. Gale Journey yesterday at his office. His wife brought him to the ER as patient had multiple falls generalized weakness decreased p.o. intake and fever with very dark appearing urine. Patient has been followed by neurologist Dr. Carles Collet for Parkinson's disease and she has been adjusting his medications due to increased falls. He denies dysuria suprapubic pain tenderness frequency.  He feels like he empties his bladder completely.  In March while he was in the hospital bladder scan was normal or did not have urinary retention. Past urine cultures ESBL Klebsiella sensitive to meropenem   ED Course: Patient received a dose of meropenem. CT head mild cerebral atrophy CBC CMP essentially normal UA positive for ketones large amount of leukocytes and proteins more than 50 WBC Assessment & Plan:   Principal Problem:   UTI (urinary tract infection)   #1 recurrent urinary tract infection for the past few months-unclear why he gets repeated UTI.  Patient had multiple urinary catheters in and out in the past none for the past few weeks or months.  He has no urinary retention.  Bladder scan here was 0 mL.  urine culture with ESBL Klebsiella pneumonia sensitive to Carbapenem. Appreciate id input Continue meropenem urine culture shows Klebsiella pneumonia ESBL only 20 K Bladder scan with 0  cc Plan to continue meropenem and to give ertapenem on Wednesday to finish 7-day course and patient can be discharged on Wednesday.   #2 Parkinson's disease-.  Continue home meds.   #3 type 2 diabetes continue SSI Restart lantus On Lantus, metformin at home CBG (last 3)  Recent Labs    03/05/22 2158 03/06/22 0745 03/06/22 1155  GLUCAP 138* 169* 165*    #4 hypertension on ACE inhibitor restart Hold Lasix   #5 BPH on Flomax continue    Estimated body mass index is 25.9 kg/m as calculated from the following:   Height as of this encounter: 6' (1.829 m).   Weight as of this encounter: 86.6 kg.  DVT prophylaxis: Lovenox Code Status: Full code Family Communication: Discussed with wife Disposition Plan:  Status is: Inpatient Remains inpatient appropriate because: IV meropenem   Consultants:  Infectious disease  Procedures: None Antimicrobials: Meropenem Subjective: Wife by the bedside very concerned about husband's recurrent urinary tract infections versus Parkinson's and multiple falls  Objective: Vitals:   03/06/22 0527 03/06/22 1238 03/06/22 1241 03/06/22 1245  BP: 140/65 116/89 (!) 154/76 (!) 145/79  Pulse: 70 (!) 58 65 73  Resp: 18     Temp: 98 F (36.7 C)   98.5 F (36.9 C)  TempSrc: Oral   Oral  SpO2: 97% 93% 97% 98%  Weight:      Height:        Intake/Output Summary (Last 24 hours) at 03/06/2022 1246 Last data filed at 03/06/2022 0843 Gross per 24  hour  Intake 360 ml  Output 1200 ml  Net -840 ml    Filed Weights   03/01/22 0921  Weight: 86.6 kg    Examination:  General exam: Appears more awake alert In nad  Respiratory system: Clear to auscultation. Respiratory effort normal. Cardiovascular system: S1 & S2 heard, RRR. No JVD, murmurs, rubs, gallops or clicks. No pedal edema. Gastrointestinal system: Abdomen is nondistended, soft and nontender. No organomegaly or masses felt. Normal bowel sounds heard. Central nervous system: Alert and  oriented. No focal neurological deficits. Extremities: Symmetric 5 x 5 power. Skin: No rashes, lesions or ulcers Psychiatry: Judgement and insight appear normal. Mood & affect appropriate.     Data Reviewed: I have personally reviewed following labs and imaging studies  CBC: Recent Labs  Lab 03/01/22 1131 03/02/22 0522 03/06/22 0736  WBC 9.6 7.3 6.0  NEUTROABS 8.2*  --   --   HGB 13.9 13.0 13.5  HCT 43.2 38.3* 40.6  MCV 91.5 90.3 89.4  PLT 216 208 025    Basic Metabolic Panel: Recent Labs  Lab 03/01/22 1131 03/02/22 0522 03/06/22 0736  NA 137 138 137  K 4.2 3.8 3.8  CL 101 105 105  CO2 '26 25 26  '$ GLUCOSE 209* 175* 169*  BUN '16 16 18  '$ CREATININE 1.24 1.21 0.98  CALCIUM 9.2 8.5* 8.7*    GFR: Estimated Creatinine Clearance: 71.5 mL/min (by C-G formula based on SCr of 0.98 mg/dL). Liver Function Tests: Recent Labs  Lab 03/01/22 1131 03/02/22 0522 03/06/22 0736  AST 14* 16 18  ALT <5 9 <5  ALKPHOS 90 77 81  BILITOT 1.0 0.7 0.9  PROT 7.3 6.2* 6.1*  ALBUMIN 3.5 2.9* 2.9*    No results for input(s): LIPASE, AMYLASE in the last 168 hours. No results for input(s): AMMONIA in the last 168 hours. Coagulation Profile: Recent Labs  Lab 03/01/22 1131  INR 1.1    Cardiac Enzymes: No results for input(s): CKTOTAL, CKMB, CKMBINDEX, TROPONINI in the last 168 hours. BNP (last 3 results) No results for input(s): PROBNP in the last 8760 hours. HbA1C: No results for input(s): HGBA1C in the last 72 hours. CBG: Recent Labs  Lab 03/05/22 1227 03/05/22 1618 03/05/22 2158 03/06/22 0745 03/06/22 1155  GLUCAP 269* 166* 138* 169* 165*    Lipid Profile: No results for input(s): CHOL, HDL, LDLCALC, TRIG, CHOLHDL, LDLDIRECT in the last 72 hours. Thyroid Function Tests: No results for input(s): TSH, T4TOTAL, FREET4, T3FREE, THYROIDAB in the last 72 hours. Anemia Panel: Recent Labs    03/04/22 1537  VITAMINB12 889    Sepsis Labs: Recent Labs  Lab  03/01/22 1131 03/01/22 1341  LATICACIDVEN 1.2 1.0     Recent Results (from the past 240 hour(s))  Urine Culture     Status: None   Collection Time: 02/28/22  4:55 AM   Specimen: Urine  Result Value Ref Range Status   MICRO NUMBER: [id number]  Final   SPECIMEN QUALITY: Adequate  Final   Sample Source URINE  Final   STATUS: FINAL  Final   ISOLATE 1:   Final    Less than 10,000 CFU/mL of single Gram negative organism isolated. No further testing will be performed. If clinically indicated, recollection using a method to minimize contamination, with prompt transfer to Urine Culture Transport Tube, is recommended.  Blood Culture (routine x 2)     Status: None   Collection Time: 03/01/22 11:31 AM   Specimen: BLOOD  Result Value Ref Range Status  Specimen Description   Final    BLOOD BLOOD RIGHT FOREARM Performed at [facility] [address] [place], [place] [address]    Special Requests   Final    BOTTLES DRAWN AEROBIC AND ANAEROBIC Blood Culture results may not be optimal due to an excessive volume of blood received in culture bottles Performed at [facility] [address] [place], [place] [address]    Culture   Final    NO GROWTH 5 DAYS Performed at [facility], [place] [address] [place], [place] [address]    Report Status 03/06/2022 FINAL  Final  Blood Culture (routine x 2)     Status: None   Collection Time: 03/01/22 11:31 AM   Specimen: BLOOD  Result Value Ref Range Status   Specimen Description   Final    BLOOD RIGHT ANTECUBITAL Performed at [facility] [address] [place], [place] [address]    Special Requests   Final    BOTTLES DRAWN AEROBIC AND ANAEROBIC Blood Culture adequate volume Performed at [facility] [address], [place], [place] [address]    Culture   Final    NO GROWTH 5 DAYS Performed at [facility], [place] [address] [place], [place] [address]     Report Status 03/06/2022 FINAL  Final  Urine Culture     Status: Abnormal   Collection Time: 03/01/22 12:14 PM   Specimen: In/Out Cath Urine  Result Value Ref Range Status   Specimen Description   Final    IN/OUT CATH URINE Performed at [facility] [address] [place], [place] [address]    Special Requests   Final    NONE Performed at [facility], [place] [address] [place],  [address]    Culture (A)  Final    2,000 COLONIES/mL KLEBSIELLA PNEUMONIAE Confirmed Extended Spectrum Beta-Lactamase Producer (ESBL).  In bloodstream infections from ESBL organisms, carbapenems are preferred over piperacillin/tazobactam. They are shown to have a lower risk of mortality.    Report Status 03/03/2022 FINAL  Final   Organism ID, Bacteria KLEBSIELLA PNEUMONIAE (A)  Final      Susceptibility   Klebsiella pneumoniae - MIC*    AMPICILLIN >=32 RESISTANT Resistant     CEFAZOLIN >=64 RESISTANT Resistant     CEFEPIME >=32 RESISTANT Resistant     CEFTRIAXONE >=64 RESISTANT Resistant     CIPROFLOXACIN >=4 RESISTANT Resistant     GENTAMICIN >=16 RESISTANT Resistant     IMIPENEM <=0.25 SENSITIVE Sensitive     NITROFURANTOIN 128 RESISTANT Resistant     TRIMETH/SULFA >=320 RESISTANT Resistant     AMPICILLIN/SULBACTAM >=32 RESISTANT Resistant     PIP/TAZO 64 INTERMEDIATE Intermediate     * 2,000 COLONIES/mL KLEBSIELLA PNEUMONIAE          Radiology Studies: No results found.      Scheduled Meds:  carbidopa-levodopa  3 tablet Oral q AM   And   carbidopa-levodopa  2 tablet Oral 5 times per day   cholecalciferol  2,000 Units Oral Daily   enoxaparin (LOVENOX) injection  40 mg Subcutaneous Q24H   entacapone  200 mg Oral 3 times per day   insulin aspart  0-15 Units Subcutaneous TID WC   insulin aspart  0-5 Units Subcutaneous QHS   insulin glargine-yfgn  10 Units Subcutaneous QHS   lisinopril  5 mg Oral Daily   tamsulosin  0.8 mg Oral Daily  Continuous Infusions:  [START ON 03/07/2022] ertapenem     meropenem (MERREM) IV 1 g (03/06/22 1231)     LOS: 5 days    Time spent: 34 min  Georgette Shell, MD  03/06/2022, 12:46 PM

## 2022-03-06 NOTE — Progress Notes (Signed)
Inpatient Diabetes Program Recommendations  AACE/ADA: New Consensus Statement on Inpatient Glycemic Control (2015)  Target Ranges:  Prepandial:   less than 140 mg/dL      Peak postprandial:   less than 180 mg/dL (1-2 hours)      Critically ill patients:  140 - 180 mg/dL   Lab Results  Component Value Date   GLUCAP 165 (H) 03/06/2022   HGBA1C 6.4 (H) 01/01/2022    Review of Glycemic Control  Diabetes history: DM2 Outpatient Diabetes medications: Lantus 15 units QHS, metformin 250 mg BID Current orders for Inpatient glycemic control: Semglee 10 QHS, Novolog 0-15 units TID with meals and 0-5 HS  HgbA1C - 6.4% 169, 165 mg/dL  Inpatient Diabetes Program Recommendations:    Agree with orders.  Spoke with pt and wife regarding his diabetes control and HgbA1C of 6.4%. Pt checks blood sugars at home, but "not as often as I should." Eats healthy diet most of the time, although occasionally has dietary indiscretions. Discussed importance of healthy eating with variety of foods using portion control. Pt says he drinks very little juice and has stopped drinking sodas. Gave ideas for increasing fiber and recommended fresh fruit instead of juices. Answered all questions related to his diabetes control.   Will f/u with Dr Inda Merlin for diabetes management.   Thank you. Lorenda Peck, RD, LDN, CDE Inpatient Diabetes Coordinator [phone]

## 2022-03-07 DIAGNOSIS — Z794 Long term (current) use of insulin: Secondary | ICD-10-CM

## 2022-03-07 DIAGNOSIS — G20 Parkinson's disease: Secondary | ICD-10-CM | POA: Diagnosis not present

## 2022-03-07 DIAGNOSIS — E119 Type 2 diabetes mellitus without complications: Secondary | ICD-10-CM

## 2022-03-07 DIAGNOSIS — N400 Enlarged prostate without lower urinary tract symptoms: Secondary | ICD-10-CM | POA: Diagnosis not present

## 2022-03-07 DIAGNOSIS — R627 Adult failure to thrive: Secondary | ICD-10-CM

## 2022-03-07 DIAGNOSIS — N390 Urinary tract infection, site not specified: Secondary | ICD-10-CM | POA: Diagnosis not present

## 2022-03-07 LAB — GLUCOSE, CAPILLARY
Glucose-Capillary: 215 mg/dL — ABNORMAL HIGH (ref 70–99)
Glucose-Capillary: 151 mg/dL — ABNORMAL HIGH (ref 70–99)

## 2022-03-07 MED ORDER — FOSFOMYCIN TROMETHAMINE 3 G PO PACK: 3.0000 g | PACK | ORAL | Status: AC

## 2022-03-07 MED ORDER — FUROSEMIDE 20 MG PO TABS: 40.0000 mg | ORAL_TABLET | Freq: Every day | ORAL | Status: DC

## 2022-03-07 NOTE — Progress Notes (Signed)
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   Encounter for general adult medical examination with abnormal findings 02/18/2018   Enlarged prostate 02/18/2018   Hearing loss 02/18/2018   Other long term (current) drug therapy 02/18/2018   Type 2 diabetes mellitus with hyperglycemia ([place]) 02/18/2018   HLD (hyperlipidemia)    Nephrolithiasis    Parkinson's disease ([place])    Diabetes mellitus with coincident hypertension ([place])    Syncope    Renal calculus    Diabetes ([place])    Allergic rhinitis    BPH (benign prostatic hyperplasia)    Erectile dysfunction    Dysphagia, pharyngeal phase    Edema    Ureteral calculus 02/09/2013   LEG PAIN 03/14/2010      HPI/subjective:  [name] is a 76 y.o. 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 Patient's wife dropped off a urine sample a few days prior to this visit of 02/28/22 asking urology to evaluate as patient has become weaker with more falls concerning for a uti 2) his neurologist has been adjusting parkinson's meds but is wondering if it is due to secondary causes making his parkinsonism  temporarily worse rather than primary progression of his parkinsonism  She last saw his urologist on 01/03/22 during admission 3/19-24 for esbl uti. He had 4 days progressive malaise/generalized weakness and was admitted to get meropnem --> ertapenem for 2 weeks. On follow up 3/22 he was feeling better.  He is currently on nitrofurantoin for prophylaxis, since 5/16. He is getting ceftriaxone as well at the [place] physician's at [facility].    I reviewed his previous urine culture on epic (last one I could see was 12/31/21) 12/31/21 kleb pna pan-resistant except imipenem, but only 20k colonies 06/2021 citrobacter freundii (S cipro/bactrim/gentamycin/nitrofurantoin) and kleb pna (S imipenem but resistant to cipro/bactrim)   Care everywhere labs: 01/31/22 ucx >100k colonies/ml of kleb pna esbl (R cipro, bactrim; s ertapenem)   He has had about 3-4 episodes of these increased parkinsonism the last 2 years and all got better with abx treatment. He doesn't take over the counter medication intermittently like benadryl that could worsen urinary retention  He denies other sx of typical uti outside of the weakness/malaise and increased parkinsonism (which is mainly more falls and shuffling gait and unsteady). He hasn't had a time of these episode where he clinically watch and improve  He mentions over the last 2 years his parkinson also gets gradually worse  Since his last episode 12/31/21 uti, he had 4-5 good week baseline sx and the last several days have had worsening parkinsonism and weakness/malaise.   He is using a fww  now which is very bad for him.  He hasn't felt better since nitrofurantoin and ceftriaxone yet.   He has not had no prostate sling procedure He last had a foley catheter in 12/2021  Normally he hasn't had to straight cath himself    Meds: Sinemet 25-100 3 tablet in morning, and 2 tablet q2hours Entacapone 200 mg tid ropinarole Lantus Acetaminophen Nitrofurantoin  5/16-c Metformin Lisinopril Omeprazole Tamsulosin Furosemide Simvastatin   ------- 5/21 inpatient rounding I spoke with patient and his wife He was admitted briefly after clinic visit. He reported falling and not able to function He was started on empiric meropenem Ucx grew <100k kleb pna esbl  3 days into abx there is no improvement  I discussed with them that 6 weeks ago with abx patient was able to function (do pt/ot) out of bed in 2-3 days   No fever, chill  5/22 inpatient rounding Today his wife and patient reports improvement of gait/strength, beginning to work with PT/OT better. Mentions today he actually has been out of bed to his chair everyday  No f/c Ucx reviewed again No n/v/diarrhea No dysuria/suprapubic pain No rash  5/24 inpatient rounding Patient hasn't worked with pt/ot since 5/22 (today restarted). Thinks he is the same as 2 days ago No complaint A external urinary catheter has been placed to help with his poor mobility and voiding  Review of Systems: ROS All other ros negative      Past Medical History:  Diagnosis Date   Abnormal involuntary movement 02/18/2018   Allergic rhinitis    Arthritis    BPH (benign prostatic hyperplasia)    Diabetes ([place])    Dysphagia, pharyngeal phase    GERD diagnosed on barium swallow. Has small hiatal hernia. Symptomatically somewhat better on omeprazole but not entirely. We'll try b.i.d. therapy   Edema    1+ in both ankles, likely multifactorial including medication such as Requip   Erectile dysfunction    Staxyn 10 mg or Viagra worked well. 3 samples of Cialis 20 mg provided   GERD (gastroesophageal reflux disease)    Hypercholesteremia    Hypertension    Nephrolithiasis    Onychomycosis of toenail    April 27, 2013 - Dr. Inocencio Homes - podiatry, was in [place] - treating with oral Lamisil and topical nail therapy   Parkinson's disease [facility])     followed by Dr. Lezlie Octave at [facility] and [name], M.D.  in [place]   Presbycusis    and tinnitus - Dr. Izora Gala - August/2013   Renal calculus    Syncope     Social History   Tobacco Use   Smoking status: Never   Smokeless tobacco: Never  Vaping Use   Vaping Use: Never used  Substance Use Topics   Alcohol use: No   Drug use: No    Family History  Problem Relation Age of Onset   Atrial fibrillation Mother    Breast cancer Mother    Emphysema Father    Heart disease Father    Cancer Brother        African [place]    Allergies  Allergen Reactions   Oxycodone Itching    This makes him itch   Sudafed [Pseudoephedrine Hcl]     Problems urinating     OBJECTIVE: Vitals:   03/06/22 1245 03/06/22 1250 03/06/22 1940 03/07/22 0316  BP: (!) 145/79 (!) 151/69 (!) 151/90 140/80  Pulse: 73 76 88 (!) 55  Resp:  18 18 15  Temp: 98.5 F (36.9 C)  (!) 97.5 F (36.4 C) 97.7 F (36.5 C)  TempSrc: Oral  Oral Oral  SpO2: 98% 98% 98% 98%  Weight:      Height:       Body mass index is 25.9 kg/m.   Physical Exam General/constitutional: no distress, pleasant, fully conversant with good even phonation and facial expression HEENT: Normocephalic, PER, Conj Clear, EOMI, Oropharynx clear Neck supple CV: rrr no mrg Lungs: clear to auscultation, normal respiratory effort Abd: Soft, Nontender Ext: no edema Skin: No Rash Neuro: no resting tremor, rigidity; LE strength 4-5/5 symmetric; didn't test gait     Lab: Lab Results  Component Value Date   WBC 6.0 03/06/2022   HGB 13.5 03/06/2022   HCT 40.6 03/06/2022   MCV 89.4 03/06/2022   PLT 283 62/26/3335   Last metabolic panel Lab Results  Component Value Date   GLUCOSE 169 (H) 03/06/2022   NA 137 03/06/2022   K 3.8 03/06/2022   CL 105 03/06/2022   CO2 26 03/06/2022   BUN 18 03/06/2022   CREATININE 0.98 03/06/2022   GFRNONAA >60 03/06/2022   CALCIUM 8.7 (L) 03/06/2022   PROT 6.1 (L) 03/06/2022   ALBUMIN 2.9 (L) 03/06/2022   BILITOT 0.9 03/06/2022   ALKPHOS 81  03/06/2022   AST 18 03/06/2022   ALT <5 03/06/2022   ANIONGAP 6 03/06/2022    Microbiology:  Serology:  Imaging:   Assessment/plan: Parkinsonism ?uti    5/21 inpatient assessment I am not too sure this is uti. I had committed to a therapeutic trial with patient as his sx and history are difficult to discern if it is primary parkinson or 2nd worsening  This time 3 days into abx no improvement. Maybe he is deconditioning We can give him the full course of abx and see (7 days) But in the mean time should also have neurology input and see if further w/u needed for his difficult gait/falls  5/22 inpatient assessment Slow improvement I express empathy in them acknowledging sometimes dx of uti rather difficult. I also relate that at this time still in diagnostic mode and should give this another week or two to see how his gait is doing after a full course of presumed UTI treatment They are agreeable I also advise them on discharge to follow up with neurology I updated dr [name] his neurologist and discussed case with Dr Rodena Piety of his primary team  -continue meropenem; on Wednesday this week to give ertapenem to finish 7 day planned uti treatment -pt/ot -follow up with dr [name] in a few week -I will seem him again to assess for continued clinical improvement and arrange ID outpatient follow up  5/23 assessment Slow improvement in gait since last follow up on 5/22 Lots of question regarding dx still But I explained again our therapeutic trial approach to eliminate potential reversible causes with uti. And currently with deconditioning if no further improvement with po/ot in the next 2 weeks would defer to neurology for further primary neurologic process  -finish abx course with one more dose ertapenem today -ok to discharge from id standpoint once cleared by pt/ot and primary team -id clinic f/u on 5/30 with me as previously discussed -he already filled fosfomycin, can take one  packet once a week starting Friday for prophylaxis -discussed with primary team -will sign off  I spent more than 35 minute reviewing data/chart, and coordinating care and >50% direct face to face time providing counseling/discussing diagnostics/treatment plan  with patient  Jabier Mutton, MD [place] for Infectious Disease [facility] 03/07/2022, 12:14 PM

## 2022-03-07 NOTE — TOC Progression Note (Addendum)
Transition of Care [facility]) - Progression Note    Patient Details  [name] MRN: [id number] Date of Birth: 09-19-1946  Transition of Care [facility]) CM/SW [place], [place] Phone Number: 03/07/2022, 1:46 PM  Clinical Narrative:      Informed by attending that family wanting [place] and prefers [place]. Contacted [facility] liaison; referral was already being reviewed. Liaison will notify CSW upon decision.    Called spouse and confirmed plan for [facility] and preference for [place]. Informed her that referral is pending.   1205: [facility] accepted referral. CSW called and notified pt's spouse. No other needs identified.     Expected Discharge Plan and [place] with [facility]     Expected Discharge Date: 03/07/22                                     Social Determinants of Health (SDOH) Interventions    Readmission Risk Interventions     View : No data to display.

## 2022-03-07 NOTE — Discharge Summary (Signed)
Discharge Summary  [name] SEG:[id number] DOB: 1945-11-13  PCP: Josetta Huddle, MD  Admit date: 03/01/2022 Discharge date: 03/07/2022  Time spent: 23mns, more than 50% time spent on coordination of care.   Recommendations for Outpatient Follow-up:  F/u with PCP within a week  for hospital discharge follow up, repeat cbc/bmp at follow up F/u with urology F/u with ID Home health    Discharge Diagnoses:  Active Hospital Problems   Diagnosis Date Noted   UTI (urinary tract infection) 03/01/2022   HLD (hyperlipidemia)    Parkinson's disease ([place]    Diabetes mellitus with coincident hypertension (HCC)    BPH (benign prostatic hyperplasia)     Resolved Hospital Problems  No resolved problems to display.    Discharge Condition: stable  Diet recommendation: heart healthy/carb modified  Filed Weights   03/01/22 0921  Weight: 86.6 kg    History of present illness: ( per admitting MD Dr [name] PCP: Eric Huddle MD Patient coming from: Home   Chief Complaint: Weakness and falls   HPI: [name] a 76y.o. 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  He has had multiple Foley catheters in the past for urinary retention which he does not have it anymore.  Patient had a ESBL UTI in March 2023.  He was treated with ertapenem for 2 weeks.  Patient is on nitrofurantoin for UTI prophylaxis since 02/27/2022.  Wife also reported he got ceftriaxone 2 doses at his PCPs office.  Patient saw infectious disease Dr. VGale Le at his office. His wife brought him to the ER as patient had multiple falls generalized weakness decreased p.o. intake and fever with very dark appearing urine. Patient has been followed by neurologist Dr. TCarles Colletfor Parkinson's disease and she has been adjusting his medications due to increased falls. He denies dysuria suprapubic pain tenderness frequency.  He feels  like he empties his bladder completely.  In March while he was in the hospital bladder scan was normal or did not have urinary retention. Past urine cultures ESBL Klebsiella sensitive to meropenem   ED Course: Patient received a dose of meropenem. CT head mild cerebral atrophy CBC CMP essentially normal UA positive for ketones large amount of leukocytes and proteins more than 50 WBC  Hospital Course:  Principal Problem:   UTI (urinary tract infection) Active Problems:   HLD (hyperlipidemia)   Parkinson's disease ([place]   Diabetes mellitus with coincident hypertension ([facility]   BPH (benign prostatic hyperplasia)   Assessment and Plan: No notes have been filed under this hospital service. Service: Hospitalist    Recurrent urinary tract infection for the past few months-could be neurogenic from progressive parkinson's  vs urinary retention    Patient had multiple urinary catheters in and out in the past none for the past few weeks or months.  He has no urinary retention this time, Bladder scan here was 0 mL.  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  Continue home meds. Weaker than normal, possible exacerbation from uti, home health, f/u with neurology   Insulin dependent type  2 diabetes  a1c 6.4 ,  controlled  Continue home meds Lantus, metformin F/u with pcp   hypertension stable on home medication  ACE inhibitor Chronic diastolic CHF, euvolemic at discharge, continue home medication Lasix 40 mg daily    BPH on Flomax continue F/u with urology    Discharge Exam: BP (!) 148/84 (BP Location: Left Arm)   Pulse 60   Temp 97.8 F  (36.6 C) (Oral)   Resp 14   Ht 6' (1.829 m)   Wt 86.6 kg   SpO2 98%   BMI 25.90 kg/m   General: NAD Cardiovascular: RRR Respiratory: normal respiratory effort     Discharge Instructions     Diet - low sodium heart healthy   Complete by: As directed    Carb modified diet , please make sure stay hydrated   Increase activity slowly   Complete by: As directed    No wound care   Complete by: As directed       Allergies as of 03/07/2022       Reactions   Oxycodone Itching   This makes him itch   Sudafed [pseudoephedrine Hcl]    Problems urinating         Medication List     STOP taking these medications    amantadine 100 MG capsule Commonly known as: SYMMETREL   mirtazapine 7.5 MG tablet Commonly known as: REMERON   nitrofurantoin (macrocrystal-monohydrate) 100 MG capsule Commonly known as: MACROBID       TAKE these medications    acetaminophen 500 MG tablet Commonly known as: TYLENOL Take 1,000 mg by mouth every 6 (six) hours as needed for fever.   carbidopa-levodopa 25-100 MG tablet Commonly known as: SINEMET IR TAKE 3 TABLETS BY MOUTH AT 7:00 AM, 2 TABLETS AT 10:00 AM, 2 TABLETS AT 12:00 PM, AND 2 TABLETS AT 2:00 PM TAKE 2 TABLETS AT 4:00 TAKE 2 TABLETS AT 6:00 PM What changed:  how much to take how to take this when to take this   cholecalciferol 1000 units tablet Commonly known as: VITAMIN D Take 2,000 Units by mouth daily.   entacapone 200 MG tablet Commonly known as: COMTAN Take 1 tablet (200 mg total) by mouth 3 (three) times daily. 1 tablet with first THREE dosages of levodopa What changed: additional instructions   fosfomycin 3 g Pack Commonly known as: MONUROL Take 3 g by mouth once a week for 3 doses.   furosemide 20 MG tablet Commonly known as: LASIX Take 2 tablets (40 mg total) by mouth daily.   Global Ease Inject Pen Needles 31G X 5 MM Misc Generic drug: Insulin Pen Needle Inject into the skin.   Lantus SoloStar 100  UNIT/ML Solostar Pen Generic drug: insulin glargine 15 Units at bedtime.   lisinopril 5 MG tablet Commonly known as: ZESTRIL Take 5 mg by mouth daily.   metFORMIN 500 MG tablet Commonly known as: GLUCOPHAGE Take 250 mg by mouth 2 (two) times daily with a meal.   naproxen sodium 220 MG tablet Commonly known as: ALEVE Take 440 mg by mouth daily as needed (fever).   omeprazole 20 MG capsule Commonly known as: PRILOSEC Take 20 mg by mouth daily as needed (acid reflux).   potassium chloride SA 20 MEQ tablet Commonly known as: KLOR-CON M Take 20 mEq by mouth daily.   rOPINIRole 3 MG tablet Commonly known as: REQUIP Take 1 tablet in the morning, Take  1 in the afternoon, Take a  half tablet  in the evening What changed:  how much to take how to take this when to take this   tamsulosin 0.4 MG Caps capsule Commonly known as: FLOMAX Take 0.8 mg by mouth daily.   triamcinolone 55 MCG/ACT Aero nasal inhaler Commonly known as: NASACORT Place 2 sprays into the nose daily as needed (SOB).   vitamin B-12 1000 MCG tablet Commonly known as: CYANOCOBALAMIN Take 1,000 mcg by mouth daily.       Allergies  Allergen Reactions   Oxycodone Itching    This makes him itch   Sudafed [Pseudoephedrine Hcl]     Problems urinating     Follow-up Information     Josetta Huddle, MD Follow up in 1 week(s).   Specialty: Internal Medicine Why: hospital discharge follow up, repeat cbc/bmp at follow up please check your blood glucose three times a day, bring in record for your pcp to reveiw Contact information: [address] [place] [address] [phone]         Jabier Mutton, MD Follow up on 03/13/2022.   Specialty: Infectious Diseases Contact information: [place] [address] [phone]         TatEustace Quail, DO Follow up in 2 week(s).   Specialty: Neurology Contact information: [place]  [id number] [place], [place] Follow up.   Why: [facility] will follow up with you for Home Health PT/OT Contact information: [place]  [address] [phone]                  The results of significant diagnostics from this hospitalization (including imaging, microbiology, ancillary and laboratory) are listed below for reference.    Significant Diagnostic Studies: CT Head Wo Contrast  Result Date: 03/01/2022 CLINICAL DATA:  Mental status change. EXAM: CT HEAD WITHOUT CONTRAST TECHNIQUE: Contiguous axial images were obtained from the base of the skull through the vertex without intravenous contrast. RADIATION DOSE REDUCTION: This exam was performed according to the departmental dose-optimization program which includes automated exposure control, adjustment of the mA and/or kV according to patient size and/or use of iterative reconstruction technique. COMPARISON:  No recent examination. MRI examination dated October 03, 2009 FINDINGS: Brain: No evidence of acute infarction, hemorrhage, hydrocephalus, extra-axial collection or mass lesion/mass effect. Mild cerebral atrophy and chronic microvascular ischemic changes of the white matter. Vascular: No hyperdense vessel or unexpected calcification. Skull: Normal. Negative for fracture or focal lesion. Sinuses/Orbits: No acute finding. Other: None. IMPRESSION: 1.  No acute intracranial abnormality. 2. Mild cerebral atrophy and chronic microvascular ischemic changes of the white matter. Electronically Signed   By: Keane Police D.O.   On: 03/01/2022 12:40   DG Chest Port 1 View  Result Date: 03/01/2022 CLINICAL DATA:  Sepsis EXAM: PORTABLE CHEST 1 VIEW COMPARISON:  None Available. FINDINGS: The heart size and mediastinal contours are within normal limits. Both lungs are clear. The visualized skeletal structures are unremarkable. IMPRESSION: No active disease. Electronically Signed   By: Kathreen Devoid M.D.   On: 03/01/2022 11:03    Microbiology: Recent Results (from the past 240 hour(s))  Urine Culture     Status: None   Collection Time: 02/28/22  4:55 AM   Specimen: Urine  Result Value Ref Range Status   MICRO NUMBER: [id number]  Final   SPECIMEN QUALITY: Adequate  Final   Sample Source  URINE  Final   STATUS: FINAL  Final   ISOLATE 1:   Final    Less than 10,000 CFU/mL of single Gram negative organism isolated. No further testing will be performed. If clinically indicated, recollection using a method to minimize contamination, with prompt transfer to Urine Culture Transport Tube, is recommended.  Blood Culture (routine x 2)     Status: None   Collection Time: 03/01/22 11:31 AM   Specimen: BLOOD  Result Value Ref Range Status   Specimen Description   Final    BLOOD BLOOD RIGHT FOREARM Performed at [facility] [address] [place], [place] [address]    Special Requests   Final    BOTTLES DRAWN AEROBIC AND ANAEROBIC Blood Culture results may not be optimal due to an excessive volume of blood received in culture bottles Performed at [facility] [address] [place], [place] [address]    Culture   Final    NO GROWTH 5 DAYS Performed at [facility], [place] [address], [place], [place] [address]    Report Status 03/06/2022 FINAL  Final  Blood Culture (routine x 2)     Status: None   Collection Time: 03/01/22 11:31 AM   Specimen: BLOOD  Result Value Ref Range Status   Specimen Description   Final    BLOOD RIGHT ANTECUBITAL Performed at [facility] [address], [place], [place] [address]    Special Requests   Final    BOTTLES DRAWN AEROBIC AND ANAEROBIC Blood Culture adequate volume Performed at [facility] [address], [place], [place] [address]    Culture   Final    NO GROWTH 5 DAYS Performed at [facility], [place] [address] [place], [place] [address]    Report  Status 03/06/2022 FINAL  Final  Urine Culture     Status: Abnormal   Collection Time: 03/01/22 12:14 PM   Specimen: In/Out Cath Urine  Result Value Ref Range Status   Specimen Description   Final    IN/OUT CATH URINE Performed at [facility] [address] [place], [place] [address]    Special Requests   Final    NONE Performed at [facility], [place] [address], [place], [place] [address]    Culture (A)  Final    2,000 COLONIES/mL KLEBSIELLA PNEUMONIAE Confirmed Extended Spectrum Beta-Lactamase Producer (ESBL).  In bloodstream infections from ESBL organisms, carbapenems are preferred over piperacillin/tazobactam. They are shown to have a lower risk of mortality.    Report Status 03/03/2022 FINAL  Final   Organism ID, Bacteria KLEBSIELLA PNEUMONIAE (A)  Final      Susceptibility   Klebsiella pneumoniae - MIC*    AMPICILLIN >=32 RESISTANT Resistant     CEFAZOLIN >=64 RESISTANT Resistant     CEFEPIME >=32 RESISTANT Resistant     CEFTRIAXONE >=64 RESISTANT Resistant     CIPROFLOXACIN >=4 RESISTANT Resistant     GENTAMICIN >=16 RESISTANT Resistant     IMIPENEM <=0.25 SENSITIVE Sensitive     NITROFURANTOIN 128 RESISTANT Resistant     TRIMETH/SULFA >=320 RESISTANT Resistant     AMPICILLIN/SULBACTAM >=32 RESISTANT Resistant     PIP/TAZO 64 INTERMEDIATE Intermediate     * 2,000 COLONIES/mL KLEBSIELLA PNEUMONIAE     Labs: Basic Metabolic Panel: Recent Labs  Lab 03/01/22 1131 03/02/22 0522 03/06/22 0736  NA 137 138 137  K 4.2 3.8 3.8  CL 101 105 105  CO2 '26 25 26  '$ GLUCOSE 209* 175* 169*  BUN '16 16 18  '$ CREATININE 1.24 1.21 0.98  CALCIUM 9.2 8.5* 8.7*   Liver Function Tests: Recent Labs  Lab 03/01/22 1131 03/02/22 0522 03/06/22 0736  AST 14* 16 18  ALT <5 9 <5  ALKPHOS 90 77 81  BILITOT 1.0 0.7 0.9  PROT 7.3 6.2* 6.1*  ALBUMIN 3.5 2.9* 2.9*   No results for input(s): LIPASE, AMYLASE in the last 168 hours. No results for  input(s): AMMONIA in the last 168 hours. CBC: Recent Labs  Lab 03/01/22 1131 03/02/22 0522 03/06/22 0736  WBC 9.6 7.3 6.0  NEUTROABS 8.2*  --   --   HGB 13.9 13.0 13.5  HCT 43.2 38.3* 40.6  MCV 91.5 90.3 89.4  PLT 216 208 283   Cardiac Enzymes: No results for input(s): CKTOTAL, CKMB, CKMBINDEX, TROPONINI in the last 168 hours. BNP: BNP (last 3 results) No results for input(s): BNP in the last 8760 hours.  ProBNP (last 3 results) No results for input(s): PROBNP in the last 8760 hours.  CBG: Recent Labs  Lab 03/06/22 1155 03/06/22 1655 03/06/22 2056 03/07/22 0806 03/07/22 1149  GLUCAP 165* 270* 269* 215* 151*    FURTHER DISCHARGE INSTRUCTIONS:   Get Medicines reviewed and adjusted: Please take all your medications with you for your next visit with your Primary MD   Laboratory/radiological data: Please request your Primary MD to go over all hospital tests and procedure/radiological results at the follow up, please ask your Primary MD to get all Hospital records sent to his/her office.   In some cases, they will be blood work, cultures and biopsy results pending at the time of your discharge. Please request that your primary care M.D. goes through all the records of your hospital data and follows up on these results.   Also Note the following: If you experience worsening of your admission symptoms, develop shortness of breath, life threatening emergency, suicidal or homicidal thoughts you must seek medical attention immediately by calling 911 or calling your MD immediately  if symptoms less severe.   You must read complete instructions/literature along with all the possible adverse reactions/side effects for all the Medicines you take and that have been prescribed to you. Take any new Medicines after you have completely understood and accpet all the possible adverse reactions/side effects.    Do not drive when taking Pain medications or sleeping medications  (Benzodaizepines)   Do not take more than prescribed Pain, Sleep and Anxiety Medications. It is not advisable to combine anxiety,sleep and pain medications without talking with your primary care practitioner   Special Instructions: If you have smoked or chewed Tobacco  in the last 2 yrs please stop smoking, stop any regular Alcohol  and or any Recreational drug use.   Wear Seat belts while driving.   Please note: You were cared for by a hospitalist during your hospital stay. Once you are discharged, your primary care physician will handle any further medical issues. Please note that NO REFILLS for any discharge medications will be authorized once you are discharged, as it is imperative that you return to your primary care physician (or establish a relationship with a primary care physician if you do not have one) for your post hospital discharge needs so that they can reassess your need for medications and monitor your lab values.     Signed:  [name] MD, PhD, FACP  [facility] 03/07/2022, 3:16 PM

## 2022-03-07 NOTE — Progress Notes (Signed)
Occupational Therapy Treatment Patient Details [name] MRN: [id number] DOB: 07/07/1946 Today's Date: 03/07/2022   History of present illness 76 yo male admitted with UTI, weakness, falls. Hx of Parkinson's, chronic bil LE edema, syncope, TKA, recurrent UTI, ESBL, hernia repair, DM   OT comments  Patient and wife were educated on recommendations for urinary incontinence. Patient and wife verbalized understanding. Patient appears to be near baseline for ADLs per wife report on this date of baseline at home. Patient was able to engage in ADLs standing at sink with min guard with RW for UB and grooming tasks with no UE support. Patient's discharge plan remains appropriate at this time. OT will continue to follow acutely.     Recommendations for follow up therapy are one component of a multi-disciplinary discharge planning process, led by the attending physician.  Recommendations may be updated based on patient status, additional functional criteria and insurance authorization.    Follow Up Recommendations  No OT follow up    Assistance Recommended at Discharge Frequent or constant Supervision/Assistance  Patient can return home with the following  A little help with walking and/or transfers;A little help with bathing/dressing/bathroom;Assistance with cooking/housework;Direct supervision/assist for financial management;Help with stairs or ramp for entrance;Direct supervision/assist for medications management;Assist for transportation   Equipment Recommendations       Recommendations for Other Services      Precautions / Restrictions Precautions Precautions: Fall Restrictions Weight Bearing Restrictions: No       Mobility Bed Mobility Overal bed mobility: Needs Assistance Bed Mobility: Supine to Sit     Supine to sit: Supervision, HOB elevated     General bed mobility comments: with increased time and use of bed rails    Transfers                          Balance Overall balance assessment: Needs assistance Sitting-balance support: No upper extremity supported, Feet supported Sitting balance-Leahy Scale: Fair     Standing balance support: During functional activity, No upper extremity supported Standing balance-Leahy Scale: Fair Standing balance comment: noted to stop to reach out of base of support even with education provided to get closer first.                           ADL either performed or assessed with clinical judgement   ADL Overall ADL's : Needs assistance/impaired     Grooming: Standing;Wash/dry face;Wash/dry hands;Brushing hair;Min guard Grooming Details (indicate cue type and reason): at sink in bathroom Upper Body Bathing: Standing;Min guard Upper Body Bathing Details (indicate cue type and reason): standing at sink in bathroom. Lower Body Bathing: Minimal assistance;Sit to/from stand   Upper Body Dressing : Standing;Min guard       Toilet Transfer: Min guard;Rolling walker (2 wheels);Ambulation Toilet Transfer Details (indicate cue type and reason): with increased time with noted stopping and starting parkinsonism movements during functional mobility in room.           General ADL Comments: patients wife expressed concerns over patient transitioning home at this time. patients wife repored that she is with patietn for all ADL tasks for SUP. patient and wife were educated that patient appeared to be at this baseline at this time. patients wife inquired about more therapy coming to the house. patient and wife were educated on [facility] services and speaking with case manager about private therapy. patients wife verbalzied understanding.  Extremity/Trunk Assessment              Vision       Perception     Praxis      Cognition Arousal/Alertness: Awake/alert Behavior During Therapy: WFL for tasks assessed/performed Overall Cognitive Status: Within Functional Limits for tasks assessed                                  General Comments: patient reported not having a good night last night. patients wife was present in room on this date.        Exercises      Shoulder Instructions       General Comments      Pertinent Vitals/ Pain       Pain Assessment Pain Assessment: No/denies pain  Home Living                                          Prior Functioning/Environment              Frequency  Min 2X/week        Progress Toward Goals  OT Goals(current goals can now be found in the care plan section)  Progress towards OT goals: Progressing toward goals     Plan Discharge plan remains appropriate    Co-evaluation                 AM-PAC OT "6 Clicks" Daily Activity     Outcome Measure   Help from another person eating meals?: None Help from another person taking care of personal grooming?: A Little Help from another person toileting, which includes using toliet, bedpan, or urinal?: A Little Help from another person bathing (including washing, rinsing, drying)?: A Little Help from another person to put on and taking off regular upper body clothing?: A Little Help from another person to put on and taking off regular lower body clothing?: A Little 6 Click Score: 19    End of Session Equipment Utilized During Treatment: Gait belt;Rolling walker (2 wheels)  OT Visit Diagnosis: Unsteadiness on feet (R26.81);Muscle weakness (generalized) (M62.81)   Activity Tolerance Patient tolerated treatment well   Patient Left in chair;with call bell/phone within reach;with family/visitor present   Nurse Communication Mobility status        Time: 6213-0865 OT Time Calculation (min): 33 min  Charges: OT General Charges $OT Visit: 1 Visit OT Treatments $Self Care/Home Management : 23-37 mins  Jackelyn Poling OTR/L, MS Acute Rehabilitation Department Office# [phone] Pager# [phone]   [name] 03/07/2022, 11:51  AM

## 2022-03-07 NOTE — Care Management Important Message (Signed)
Important Message  Patient Details IM Letter given to the Patient. Name: RHETT MUTSCHLER MRN: [id number] Date of Birth: Aug 31, 1946   Medicare Important Message Given:  Yes     [name] 03/07/2022, 9:32 AM

## 2022-03-11 DIAGNOSIS — N183 Chronic kidney disease, stage 3 unspecified: Secondary | ICD-10-CM | POA: Diagnosis not present

## 2022-03-11 DIAGNOSIS — N201 Calculus of ureter: Secondary | ICD-10-CM | POA: Diagnosis not present

## 2022-03-11 DIAGNOSIS — E1122 Type 2 diabetes mellitus with diabetic chronic kidney disease: Secondary | ICD-10-CM | POA: Diagnosis not present

## 2022-03-11 DIAGNOSIS — E7800 Pure hypercholesterolemia, unspecified: Secondary | ICD-10-CM | POA: Diagnosis not present

## 2022-03-11 DIAGNOSIS — J309 Allergic rhinitis, unspecified: Secondary | ICD-10-CM | POA: Diagnosis not present

## 2022-03-11 DIAGNOSIS — I130 Hypertensive heart and chronic kidney disease with heart failure and stage 1 through stage 4 chronic kidney disease, or unspecified chronic kidney disease: Secondary | ICD-10-CM | POA: Diagnosis not present

## 2022-03-11 DIAGNOSIS — K219 Gastro-esophageal reflux disease without esophagitis: Secondary | ICD-10-CM | POA: Diagnosis not present

## 2022-03-11 DIAGNOSIS — G20 Parkinson's disease: Secondary | ICD-10-CM | POA: Diagnosis not present

## 2022-03-11 DIAGNOSIS — E871 Hypo-osmolality and hyponatremia: Secondary | ICD-10-CM | POA: Diagnosis not present

## 2022-03-11 DIAGNOSIS — D72829 Elevated white blood cell count, unspecified: Secondary | ICD-10-CM | POA: Diagnosis not present

## 2022-03-11 DIAGNOSIS — R1313 Dysphagia, pharyngeal phase: Secondary | ICD-10-CM | POA: Diagnosis not present

## 2022-03-11 DIAGNOSIS — E1165 Type 2 diabetes mellitus with hyperglycemia: Secondary | ICD-10-CM | POA: Diagnosis not present

## 2022-03-11 DIAGNOSIS — Z794 Long term (current) use of insulin: Secondary | ICD-10-CM | POA: Diagnosis not present

## 2022-03-11 DIAGNOSIS — M1990 Unspecified osteoarthritis, unspecified site: Secondary | ICD-10-CM | POA: Diagnosis not present

## 2022-03-11 DIAGNOSIS — Z7984 Long term (current) use of oral hypoglycemic drugs: Secondary | ICD-10-CM | POA: Diagnosis not present

## 2022-03-11 DIAGNOSIS — I444 Left anterior fascicular block: Secondary | ICD-10-CM | POA: Diagnosis not present

## 2022-03-11 DIAGNOSIS — E1151 Type 2 diabetes mellitus with diabetic peripheral angiopathy without gangrene: Secondary | ICD-10-CM | POA: Diagnosis not present

## 2022-03-11 DIAGNOSIS — N3000 Acute cystitis without hematuria: Secondary | ICD-10-CM | POA: Diagnosis not present

## 2022-03-11 DIAGNOSIS — I5032 Chronic diastolic (congestive) heart failure: Secondary | ICD-10-CM | POA: Diagnosis not present

## 2022-03-11 DIAGNOSIS — I872 Venous insufficiency (chronic) (peripheral): Secondary | ICD-10-CM | POA: Diagnosis not present

## 2022-03-11 DIAGNOSIS — H9190 Unspecified hearing loss, unspecified ear: Secondary | ICD-10-CM | POA: Diagnosis not present

## 2022-03-13 ENCOUNTER — Encounter: Payer: Self-pay | Admitting: Internal Medicine

## 2022-03-13 ENCOUNTER — Other Ambulatory Visit: Payer: Self-pay

## 2022-03-13 ENCOUNTER — Ambulatory Visit (INDEPENDENT_AMBULATORY_CARE_PROVIDER_SITE_OTHER): Payer: Medicare Other | Admitting: Internal Medicine

## 2022-03-13 VITALS — BP 107/69 | HR 75 | Temp 97.9°F | Wt 184.2 lb

## 2022-03-13 DIAGNOSIS — G20 Parkinson's disease: Secondary | ICD-10-CM

## 2022-03-13 DIAGNOSIS — B372 Candidiasis of skin and nail: Secondary | ICD-10-CM | POA: Diagnosis not present

## 2022-03-13 DIAGNOSIS — R8271 Bacteriuria: Secondary | ICD-10-CM

## 2022-03-13 MED ORDER — HYDROCORTISONE 0.5 % EX CREA: 1.0000 "application " | TOPICAL_CREAM | Freq: Two times a day (BID) | CUTANEOUS | 3 refills | Status: DC

## 2022-03-13 MED ORDER — METHENAMINE MANDELATE 1 G PO TABS: 1000.0000 mg | ORAL_TABLET | Freq: Two times a day (BID) | ORAL | 3 refills | Status: DC

## 2022-03-13 MED ORDER — CLOTRIMAZOLE 1 % EX CREA: 1.0000 "application " | TOPICAL_CREAM | Freq: Two times a day (BID) | CUTANEOUS | 3 refills | Status: DC

## 2022-03-13 NOTE — Progress Notes (Signed)
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   Encounter for general adult medical examination with abnormal findings 02/18/2018   Enlarged prostate 02/18/2018   Hearing loss 02/18/2018   Other long term (current) drug therapy 02/18/2018   Type 2 diabetes mellitus with hyperglycemia ([place]) 02/18/2018   HLD (hyperlipidemia)    Nephrolithiasis    Parkinson's disease ([place])    Diabetes mellitus with coincident hypertension ([place])    Syncope    Renal calculus    Diabetes ([place])    Allergic rhinitis    BPH (benign prostatic hyperplasia)    Erectile dysfunction    Dysphagia, pharyngeal phase    Edema    Ureteral calculus 02/09/2013   LEG PAIN 03/14/2010      HPI: [name] is a 76 y.o. 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 Patient's wife dropped off a urine sample a few days prior to this visit of 02/28/22 asking urology to evaluate as patient has become weaker with more falls concerning for a uti 2) his neurologist has been adjusting parkinson's meds but is wondering if it is due to secondary causes making his  parkinsonism temporarily worse rather than primary progression of his parkinsonism  She last saw his urologist on 01/03/22 during admission 3/19-24 for esbl uti. He had 4 days progressive malaise/generalized weakness and was admitted to get meropnem --> ertapenem for 2 weeks. On follow up 3/22 he was feeling better.  He is currently on nitrofurantoin for prophylaxis, since 5/16. He is getting ceftriaxone as well at [facility].    I reviewed his previous urine culture on epic (last one I could see was 12/31/21) 12/31/21 kleb pna pan-resistant except imipenem, but only 20k colonies 06/2021 citrobacter freundii (S cipro/bactrim/gentamycin/nitrofurantoin) and kleb pna (S imipenem but resistant to cipro/bactrim)   Care everywhere labs: 01/31/22 ucx >100k colonies/ml of kleb pna esbl (R cipro, bactrim; s ertapenem)   He has had about 3-4 episodes of these increased parkinsonism the last 2 years and all got better with abx treatment. He doesn't take over the counter medication intermittently like benadryl that could worsen urinary retention  He denies other sx of typical uti outside of the weakness/malaise and increased parkinsonism (which is mainly more falls and shuffling gait and unsteady). He hasn't had a time of these episode where he clinically watch and improve  He mentions over the last 2 years his parkinson also gets gradually worse  Since his last episode 12/31/21 uti, he had 4-5 good week baseline sx and the last several days have had worsening parkinsonism and weakness/malaise.   He is  using a fww now which is very bad for him.  He hasn't felt better since nitrofurantoin and ceftriaxone yet.   He has not had no prostate sling procedure He last had a foley catheter in 12/2021  Normally he hasn't had to straight cath himself    Meds: Sinemet 25-100 3 tablet in morning, and 2 tablet q2hours Entacapone 200 mg  tid ropinarole Lantus Acetaminophen Nitrofurantoin 5/16-c Metformin Lisinopril Omeprazole Tamsulosin Furosemide Simvastatin   ------------------------ 03/13/22 id clinic  Since hospital discharge patient hasn't felt better Still uses the walker and feeling sleepy Home health pt/ot just had their first visit -- will come twice a week at least No abdominal pain No fever/chill  Will see dr Hall Busing 6/9th for neurologic evaluation  Developed a new groin rash -- started in hospital  Review of Systems: ROS All other ros negative      Past Medical History:  Diagnosis Date   Abnormal involuntary movement 02/18/2018   Allergic rhinitis    Arthritis    BPH (benign prostatic hyperplasia)    Diabetes ([place])    Dysphagia, pharyngeal phase    GERD diagnosed on barium swallow. Has small hiatal hernia. Symptomatically somewhat better on omeprazole but not entirely. We'll try b.i.d. therapy   Edema    1+ in both ankles, likely multifactorial including medication such as Requip   Erectile dysfunction    Staxyn 10 mg or Viagra worked well. 3 samples of Cialis 20 mg provided   GERD (gastroesophageal reflux disease)    Hypercholesteremia    Hypertension    Nephrolithiasis    Onychomycosis of toenail    April 27, 2013 - Dr. Inocencio Homes - podiatry, was in [place] - treating with oral Lamisil and topical nail therapy   Parkinson's disease [facility])     followed by Dr. Lezlie Octave at [facility] and [name], M.D. in [facility]   Presbycusis    and tinnitus - Dr. Izora Gala - August/2013   Renal calculus    Syncope     Social History   Tobacco Use   Smoking status: Never   Smokeless tobacco: Never  Vaping Use   Vaping Use: Never used  Substance Use Topics   Alcohol use: No   Drug use: No    Family History  Problem Relation Age of Onset   Atrial fibrillation Mother    Breast cancer Mother    Emphysema Father    Heart disease Father    Cancer Brother        [place]     Allergies  Allergen Reactions   Oxycodone Itching    This makes him itch   Sudafed [Pseudoephedrine Hcl]     Problems urinating     OBJECTIVE: Vitals:   03/13/22 1001  BP: 107/69  Pulse: 75  Temp: 97.9 F (36.6 C)  TempSrc: Oral  SpO2: 94%  Weight: 184 lb 3.2 oz (83.6 kg)   Body mass index is 24.98 kg/m.   Physical Exam General/constitutional: no distress, pleasant HEENT: Normocephalic, PER, Conj Clear, EOMI, Oropharynx clear Neck supple CV: rrr no mrg Lungs: clear to auscultation, normal respiratory effort Abd: Soft, Nontender Ext: no edema Skin: erythematous rash bilateral groin with excoriation and satellite erythematous lesions Neuro: normal facial expression/verbal tone-articulation; walks with fww shuffling MSK: no peripheral joint swelling/tenderness/warmth; back spines nontender     Lab: Lab Results  Component Value Date   WBC 6.0 03/06/2022   HGB 13.5 03/06/2022   HCT 40.6 03/06/2022   MCV  89.4 03/06/2022   PLT 283 34/19/6222   Last metabolic panel Lab Results  Component Value Date   GLUCOSE 169 (H) 03/06/2022   NA 137 03/06/2022   K 3.8 03/06/2022   CL 105 03/06/2022   CO2 26 03/06/2022   BUN 18 03/06/2022   CREATININE 0.98 03/06/2022   GFRNONAA >60 03/06/2022   CALCIUM 8.7 (L) 03/06/2022   PROT 6.1 (L) 03/06/2022   ALBUMIN 2.9 (L) 03/06/2022   BILITOT 0.9 03/06/2022   ALKPHOS 81 03/06/2022   AST 18 03/06/2022   ALT <5 03/06/2022   ANIONGAP 6 03/06/2022    Microbiology:  Serology:  Imaging:   Assessment/plan: Problem List Items Addressed This Visit   None Visit Diagnoses     Candidal intertrigo    -  Primary   Parkinson disease ([place])       Asymptomatic bacteriuria          Discuss concept of assymptomatic bacteriuria and uti  His symptoms although atypical could be consistent with episodes of uti worsening parkinsonism. There is no intermittent offending medications that triggers these symptoms  He has  consistently grown kleb pna esbl. His current ceftriaxone and nitrofurantoin are not sensitive to this bacteria  We could try fosfamycin now and if doesn't work, will repeat culture and see if carbapenem is needed    His story suggest that over the last couple years his parkinsonism is slowing progressive but clearly these episodes of presumed uti had demonstrated reversible brief duration of worsening parkinonism   ------------ 5/30 assessment Admitted recently to get carbapenem for presumed uti but no improvement despite cultures x2 without other resistant bacteremia This now appears to be assymptomatic bacteriuria and not uti  Can keep on prophylaxis against uti although at this time not sure if he has had recurrent uti frequently; definitely at risk for it. Can finish weekly fosfomycin then go on daily nitrofurantoin  For his groin rash which is candida intetrigo, will use hydrocortisone and lotrimin cream bid otc for 2 weeks   Follow up in 4-6 weeks  Follow up with dr Sondra Come as per her discretion     Follow-up: Return in about 3 months (around 06/13/2022).   I have spent a total of 80 minutes of face-to-face and non-face-to-face time, excluding clinical staff time, preparing to see patient, ordering tests and/or medications, and provide counseling the patient    Jabier Mutton, [place] 03/13/2022, 10:22 AM

## 2022-03-13 NOTE — Patient Instructions (Addendum)
At this time it doesn't appear what you had now is due to infection of bladder. The bacteria you have in the urine is just a colonizer   You are at risk for recurrent bladder infection though and it is reasonable to stay on prophylaxis Finish weekly fosfomycin then start once a day macrobid  Also take methenamine tablet with vitamin c twice a day to reduce risk of uti   Drink plenty of fluid   See me in around 3 months.

## 2022-03-15 DIAGNOSIS — N390 Urinary tract infection, site not specified: Secondary | ICD-10-CM | POA: Diagnosis not present

## 2022-03-19 ENCOUNTER — Ambulatory Visit: Payer: Medicare Other | Admitting: Internal Medicine

## 2022-03-19 DIAGNOSIS — E1151 Type 2 diabetes mellitus with diabetic peripheral angiopathy without gangrene: Secondary | ICD-10-CM | POA: Diagnosis not present

## 2022-03-19 DIAGNOSIS — I5032 Chronic diastolic (congestive) heart failure: Secondary | ICD-10-CM | POA: Diagnosis not present

## 2022-03-19 DIAGNOSIS — E871 Hypo-osmolality and hyponatremia: Secondary | ICD-10-CM | POA: Diagnosis not present

## 2022-03-19 DIAGNOSIS — I444 Left anterior fascicular block: Secondary | ICD-10-CM | POA: Diagnosis not present

## 2022-03-19 DIAGNOSIS — E7800 Pure hypercholesterolemia, unspecified: Secondary | ICD-10-CM | POA: Diagnosis not present

## 2022-03-19 DIAGNOSIS — N3000 Acute cystitis without hematuria: Secondary | ICD-10-CM | POA: Diagnosis not present

## 2022-03-19 DIAGNOSIS — Z7984 Long term (current) use of oral hypoglycemic drugs: Secondary | ICD-10-CM | POA: Diagnosis not present

## 2022-03-19 DIAGNOSIS — I872 Venous insufficiency (chronic) (peripheral): Secondary | ICD-10-CM | POA: Diagnosis not present

## 2022-03-19 DIAGNOSIS — R1313 Dysphagia, pharyngeal phase: Secondary | ICD-10-CM | POA: Diagnosis not present

## 2022-03-19 DIAGNOSIS — G20 Parkinson's disease: Secondary | ICD-10-CM | POA: Diagnosis not present

## 2022-03-19 DIAGNOSIS — E1165 Type 2 diabetes mellitus with hyperglycemia: Secondary | ICD-10-CM | POA: Diagnosis not present

## 2022-03-19 DIAGNOSIS — D72829 Elevated white blood cell count, unspecified: Secondary | ICD-10-CM | POA: Diagnosis not present

## 2022-03-19 DIAGNOSIS — I130 Hypertensive heart and chronic kidney disease with heart failure and stage 1 through stage 4 chronic kidney disease, or unspecified chronic kidney disease: Secondary | ICD-10-CM | POA: Diagnosis not present

## 2022-03-19 DIAGNOSIS — N183 Chronic kidney disease, stage 3 unspecified: Secondary | ICD-10-CM | POA: Diagnosis not present

## 2022-03-19 DIAGNOSIS — H9190 Unspecified hearing loss, unspecified ear: Secondary | ICD-10-CM | POA: Diagnosis not present

## 2022-03-19 DIAGNOSIS — N201 Calculus of ureter: Secondary | ICD-10-CM | POA: Diagnosis not present

## 2022-03-19 DIAGNOSIS — E1122 Type 2 diabetes mellitus with diabetic chronic kidney disease: Secondary | ICD-10-CM | POA: Diagnosis not present

## 2022-03-19 DIAGNOSIS — K219 Gastro-esophageal reflux disease without esophagitis: Secondary | ICD-10-CM | POA: Diagnosis not present

## 2022-03-19 DIAGNOSIS — J309 Allergic rhinitis, unspecified: Secondary | ICD-10-CM | POA: Diagnosis not present

## 2022-03-19 DIAGNOSIS — M1990 Unspecified osteoarthritis, unspecified site: Secondary | ICD-10-CM | POA: Diagnosis not present

## 2022-03-19 DIAGNOSIS — Z794 Long term (current) use of insulin: Secondary | ICD-10-CM | POA: Diagnosis not present

## 2022-03-20 NOTE — Progress Notes (Signed)
Assessment/Plan:   1.  Parkinsons Disease, diagnosed 2010 with symptoms to 2008  -Patient previously taking carbidopa/levodopa 25/100, 3 at 7 to 8 AM, 2 tablets at 10 AM, 2 tablets at 12 PM, 2 tablets at 2 PM, 2 tablets at 4 PM, 2 tablets at 6 PM.  Patient/wife have adjusted the medication so that he is not taking 3 tablets every 3 hours, for a total of 12 tablets/day.  They feel that it is working the same  -Continue entacapone 200 mg with first three dosages of levodopa   -For now, we will continue ropinirole 3 mg, 1/1/0.5  -He has had a little bit of dyskinesia since stopping the amantadine.  It is very mild and I would not recommend starting the amantadine back.  -They asked about adding more medication today.  Discussed with patient and wife that I do not think it is going to be valuable for me to continue to change and increase his Parkinson's medication.  I have done this the last several visits, the most recent at the end of March.  They have not found this to be valuable and he has continued to have falls.  I strongly believe that this is because he is dealing with other medical illnesses, some of which have required hospitalizations.  This will certainly cause Parkinson symptoms to come out and cause more falls.  No amount of changing Parkinson's medication is going to help that until he is able to get healthy.  In addition, when he goes out of the house he will leave his walker at home, which makes things very difficult and causes more falls.  -Discussed with the patient that the best thing that he could do would be to use his walker at all times.  We also discussed the value of intensive outpatient physical therapy.  He does not want to do either of those things.  He had some home physical therapy, which is not nearly as intensive.  In addition, I am very worried about the stairs in his home.  He had a significant fall yesterday, breaking the sheet rock.  Discussed that the fall could have  been even more serious and he could have had a brain bleed or hip fracture.  Discussed that I really do not want him using the stairs, unless he has a stair lift.  He was resistant.  2.  History of UTI worsening parkinsonism  -Following with infectious disease and currently finishing antibiotics and starting prophylaxis.  Greatly appreciate ID input and coordination of care.   Subjective:   [name] was seen today in follow up for Parkinsons disease.  My previous records were reviewed prior to todays visit as well as outside records available to me. Patient last seen at the very end of March.  I have increased his levodopa the last several visits and added entacapone, but they have continued to call and stated that he is falling.  However, the patient has had multiple medical illnesses (some requiring hospitalizations).  Wife wanted him worked in as she thought it was the Parkinson's worsening as opposed to medical illness causing Parkinson symptoms.  Patient was worked in for May 18. he had an appointment with infectious disease the day prior because the falls became extreme and she thought that he had infection.  I spoke to the ID physician before the appointment via secure chat, and reviewed the records.  Patient has consistently grown kleb pna esbl and his treatment of ceftriaxone  and nitrofurantoin were not sensitive to this bacteria.  Infectious disease had changed his medications.  On the day that he was scheduled to follow-up here, May 18, the patient was readmitted to the hospital.  I talked to the hospitalist as well as infectious disease physician.  Initially, the patient was not responding to antibiotics in the hospital, but then he seemed to get better.  He followed up with infectious disease on May 30.  At that appointment, it was felt that the patient had asymptomatic bacteriuria and did not urinary tract infection.  He is on prophylactic antibiotics now.  He was doing a little better but  he had a fall on stairs yesterday.      Current prescribed movement disorder medications: carbidopa/levodopa 25/100, 3 at 7 to 8 AM, 2 tablets at 10 AM, 2 tablets at 12 PM, 2 tablets at 2 PM, 2 tablets at 4 PM, 2 tablets at 6 PM (increased again March 28) -instead of doing this they are taking 3 tablets every 3 hours, for a total of 12 tablets/day Entacapone, 200 mg with first 3 dosages of levodopa  requip 3 mg, 1 tablet in the morning, 1 in the afternoon, half tablet in the evening (slight decrease last time)   PREVIOUS MEDICATIONS: neupro (helped but costly); requip; levodopa IR  ALLERGIES:   Allergies  Allergen Reactions   Oxycodone Itching    This makes him itch   Sudafed [Pseudoephedrine Hcl]     Problems urinating     CURRENT MEDICATIONS:  Outpatient Encounter Medications as of 03/23/2022  Medication Sig   acetaminophen (TYLENOL) 500 MG tablet Take 1,000 mg by mouth every 6 (six) hours as needed for fever.   Ascorbic Acid (VITAMIN C) 100 MG tablet Take 100 mg by mouth daily.   carbidopa-levodopa (SINEMET IR) 25-100 MG tablet TAKE 3 TABLETS BY MOUTH AT 7:00 AM, 2 TABLETS AT 10:00 AM, 2 TABLETS AT 12:00 PM, AND 2 TABLETS AT 2:00 PM TAKE 2 TABLETS AT 4:00 TAKE 2 TABLETS AT 6:00 PM (Patient taking differently: Take 2-3 tablets by mouth See admin instructions. TAKE 3 TABLETS BY MOUTH AT 7:00 AM, 2 TABLETS AT 10:00 AM, 2 TABLETS AT 12:00 PM, AND 2 TABLETS AT 2:00 PM TAKE 2 TABLETS AT 4:00 TAKE 2 TABLETS AT 6:00 PM)   cholecalciferol (VITAMIN D) 1000 UNITS tablet Take 2,000 Units by mouth daily.   clotrimazole (LOTRIMIN AF) 1 % cream Apply 1 application. topically 2 (two) times daily.   entacapone (COMTAN) 200 MG tablet Take 1 tablet (200 mg total) by mouth 3 (three) times daily. 1 tablet with first THREE dosages of levodopa (Patient taking differently: Take 200 mg by mouth 3 (three) times daily. Takes 200 mg morning and noon and 100 mg at night)   fosfomycin (MONUROL) 3 g PACK Take 3 g by  mouth once.   furosemide (LASIX) 20 MG tablet Take 2 tablets (40 mg total) by mouth daily. (Patient taking differently: Take 40 mg by mouth daily. Taken 20 mg daily)   GLOBAL EASE INJECT PEN NEEDLES 31G X 5 MM MISC Inject into the skin.   hydrocortisone cream 0.5 % Apply 1 application. topically 2 (two) times daily.   LANTUS SOLOSTAR 100 UNIT/ML Solostar Pen 15 Units at bedtime.   lisinopril (ZESTRIL) 5 MG tablet Take 5 mg by mouth daily.   metFORMIN (GLUCOPHAGE) 500 MG tablet Take 250 mg by mouth 2 (two) times daily with a meal. Taken it daily   methenamine (MANDELAMINE) 1  g tablet Take 1 tablet (1,000 mg total) by mouth 2 (two) times daily. Take with any vitamin c, twice a day for uti prevention   naproxen sodium (ALEVE) 220 MG tablet Take 440 mg by mouth daily as needed (fever).   omeprazole (PRILOSEC) 20 MG capsule Take 20 mg by mouth daily as needed (acid reflux).   potassium chloride SA (KLOR-CON) 20 MEQ tablet Take 20 mEq by mouth daily.   rOPINIRole (REQUIP) 3 MG tablet Take 1 tablet in the morning, Take  1 in the afternoon, Take a  half tablet in the evening (Patient taking differently: Take 1.5-3 mg by mouth See admin instructions. Take 1 tablet in the morning, Take  1 in the afternoon, Take a  half tablet in the evening)   tamsulosin (FLOMAX) 0.4 MG CAPS capsule Take 0.8 mg by mouth daily.   triamcinolone (NASACORT) 55 MCG/ACT AERO nasal inhaler Place 2 sprays into the nose daily as needed (SOB).   vitamin B-12 (CYANOCOBALAMIN) 1000 MCG tablet Take 1,000 mcg by mouth daily.   [EXPIRED] fosfomycin (MONUROL) 3 g PACK Take 3 g by mouth once a week for 3 doses.   No facility-administered encounter medications on file as of 03/23/2022.    Objective:   PHYSICAL EXAMINATION:    VITALS:   Vitals:   03/23/22 0959  BP: 120/60  Pulse: 67  SpO2: 95%  Weight: 198 lb 3.2 oz (89.9 kg)  Height: 6' (1.829 m)     Wt Readings from Last 3 Encounters:  03/23/22 198 lb 3.2 oz (89.9 kg)   03/13/22 184 lb 3.2 oz (83.6 kg)  03/01/22 191 lb (86.6 kg)    GEN:  The patient appears stated age and is in NAD. HEENT:  Normocephalic, atraumatic.  The mucous membranes are moist. The superficial temporal arteries are without ropiness or tenderness. CV:  RRR Lungs:  CTAB Neck/HEME:  There are no carotid bruits bilaterally.   Neurological examination:  Orientation: The patient is alert and oriented x3. Cranial nerves: There is good facial symmetry with min facial hypomimia. The speech is fluent and clear. Soft palate rises symmetrically and there is no tongue deviation. Hearing is intact to conversational tone. Sensation: Sensation is intact to light touch throughout Motor: Strength is at least antigravity x4.  Movement examination: Tone: There is nl tone in the UE/LE. Abnormal movements: mild dyskinesia of the R leg Coordination:  There is minimal slowness with finger taps bilaterally.  Otherwise, rapid alternating movements are good. Gait and Station: The patient has no difficulty arising out of a deep-seated chair without the use of the hands.  He is forward flexed and short stepped.  He ambulates with a cane.  He brings a walker, but does not use it.    I have reviewed and interpreted the following labs independently    Chemistry      Component Value Date/Time   NA 137 03/06/2022 0736   NA 139 07/24/2021 1107   K 3.8 03/06/2022 0736   CL 105 03/06/2022 0736   CO2 26 03/06/2022 0736   BUN 18 03/06/2022 0736   BUN 23 07/24/2021 1107   CREATININE 0.98 03/06/2022 0736      Component Value Date/Time   CALCIUM 8.7 (L) 03/06/2022 0736   ALKPHOS 81 03/06/2022 0736   AST 18 03/06/2022 0736   ALT <5 03/06/2022 0736   BILITOT 0.9 03/06/2022 0736       Lab Results  Component Value Date   WBC 6.0 03/06/2022  HGB 13.5 03/06/2022   HCT 40.6 03/06/2022   MCV 89.4 03/06/2022   PLT 283 03/06/2022    No results found for: "TSH"   Total time spent on today's visit was  43 minutes, including both face-to-face time and nonface-to-face time.  Time included that spent on review of records (prior notes available to me/labs/imaging if pertinent), discussing treatment and goals, answering patient's questions and coordinating care.  Cc:  Josetta Huddle, MD

## 2022-03-23 ENCOUNTER — Encounter: Payer: Self-pay | Admitting: Neurology

## 2022-03-23 ENCOUNTER — Ambulatory Visit (INDEPENDENT_AMBULATORY_CARE_PROVIDER_SITE_OTHER): Payer: Medicare Other | Admitting: Neurology

## 2022-03-23 VITALS — BP 120/60 | HR 67 | Ht 72.0 in | Wt 198.2 lb

## 2022-03-23 DIAGNOSIS — G249 Dystonia, unspecified: Secondary | ICD-10-CM | POA: Diagnosis not present

## 2022-03-23 DIAGNOSIS — G20 Parkinson's disease: Secondary | ICD-10-CM

## 2022-03-23 MED ORDER — CARBIDOPA-LEVODOPA 25-100 MG PO TABS: 3.0000 | ORAL_TABLET | Freq: Four times a day (QID) | ORAL | 1 refills | Status: DC

## 2022-03-23 NOTE — Patient Instructions (Signed)
Let me know if I can send a referral to physical therap You need to use a walker at all times I recommend no stairs!  Local and Online Resources for Power over Parkinson's Group June 2023  LOCAL [place] PARKINSON'S GROUPS  Power over Parkinson's Group:   Power Over Parkinson's Patient Education Group will be Wednesday, June 14th-*Hybrid meting*- in person at [facility] location and via [facility] at 2:00 pm.   Upcoming [employer] over [employer] Meetings:  2nd Wednesdays of the month at 2 pm:   June 14th, July 12th Contact Amy Marriott at [email or web address] if interested in participating in this group Parkinson's Care Partners Group:    3rd Mondays, Contact Misty Paladino Atypical Parkinsonian Patient Group:   4th Wednesdays, [place] If you are interested in participating in these groups with Misty, please contact her directly for how to join those meetings.  Her contact information is [email or web address].    LOCAL EVENTS AND NEW OFFERINGS Dance Class for People with Parkinson's at [facility].  Friday, June 9th at 2 pm.  Dora Sims by Tyler Deis DPT students.  Contact [email or web address] to register or with questions. Ice Cream Social at [place]!  Thursday, June 15th, 5:30-7:00 pm.  RSVP to [email or web address] for attendance and free ice cream. Parkinson's T-shirts for sale!  Designed by a local group member, with funds going to [place].  $25.00  Investment banker, corporate to purchase  [employer]! Moves [employer] Instructor-Led Class offering at [employer]!  Wednesdays 1-2 pm, starting April 12th.   Contact Bryson Dames, Acupuncturist at [employer].  [email or web address] [email or web address]  [place]:  www.[email or web address] PD Health at Home continues:  Mindfulness Mondays, Wellness Wednesdays, Fitness Fridays  Upcoming Education: Parkinson's 101:  What You and Your Family Should Know.  Wednesday, June 7th at 1:00  pm Register for expert briefings (webinars) at [email or web address] Please check out their website to sign up for emails and see their full online offerings   [place]:  [email or web address]  Third Thursday Webinars:  On the third Thursday of every month at 12 p.m. ET, join our free live webinars to learn about various aspects of living with Parkinson's disease and our work to speed medical breakthroughs. Upcoming Webinar: REPLAY:  From Low Blood Pressure to Bladder Problems:  A Look at Lesser Known Parkinson's Symptoms.  Thursday, June 15th at 12 noon. Check out additional information on their website to see their full online offerings  [facility]:  [email or web address] Upcoming Webinar:   Stay tuned Webinar Series:  Living with Parkinson's Meetup.   Third Thursdays each month, 3 pm Care Partner Monthly Meetup.  With Robin Searing [place].  First Tuesday of each month, 2 pm Check out additional information to Live Well Today on their website  Parkinson and Movement Disorders (PMD) Alliance:  www.pmdalliance.org [employer] Online:  Online Education Events Sign up for emails, which are sent weekly to give you updates on programming and online offerings  Parkinson's Association of the [place]:  www.[email or web address] Information on online support groups, education events, and online exercises including Yoga, Parkinson's exercises and more-LOTS of information on links to PD resources and online events Virtual Support Group through Parkinson's Association of the [place]; next one is scheduled for Wednesday, June 7th at 2 pm. (These are typically scheduled for the 1st Wednesday of the month at 2 pm).  Visit website for details. Save the date for "Caring for Parkinson's-Caring for You", 9th Annual Symposium.  In-person event in [place].  September  9th.  More info on registration to  come. MOVEMENT AND EXERCISE OPPORTUNITIES PWR! Moves Classes at [place].  Wednesdays 10 and 11 am.   Contact Amy Marriott, PT [email or web address] if interested. NEW PWR! Moves Class offering at [employer].  Wednesdays 1-2 pm, starting April 12th.  Contact Bryson Dames, Acupuncturist at [employer].  [email or web address] Here is a link to the PWR!Moves classes on Zoom from [place] - Daily Mon-Sat at 10:00. Via Zoom, FREE and open to all.  There is also a link below via Facebook if you use that platform.  [email or web address].si [email or web address]  Parkinson's Wellness Recovery (PWR! Moves)  www.pwr4life.org Info on the PWR! Virtual Experience:  You will have access to our expertise through self-assessment, guided plans that start with the PD-specific fundamentals, educational content, tips, Q&A with an expert, and a growing Art therapist of PD-specific pre-recorded and live exercise classes of varying types and intensity - both physical and cognitive! If that is not enough, we offer 1:1 wellness consultations (in-person or virtual) to personalize your PWR! Research scientist (medical).  [place] Fridays:  As part of the PD Health @ Home program, this free video series focuses each week on one aspect of fitness designed to support people living with Parkinson's.  These weekly videos highlight the [place] recent fitness guidelines for people with Parkinson's disease. ModemGamers.si Dance for PD website is offering free, live-stream classes throughout the week, as well as links to [employer] of classes:  https://danceforparkinsons.org/ Virtual dance and Pilates for Parkinson's classes: Click on the  Community Tab> Parkinson's Movement Initiative Tab.  To register for classes and for more information, visit [email or web address] and click the "community" tab.  [employer] Parkinson's Cycling Classes  [employer]:  Thursdays @ Noon-Live classes at [employer] ([employer] at [email or web address] or [phone]) [employer]: Virtual Classes Mondays and Thursdays Jeanette Caprice classes Tuesday, Wednesday and Thursday (contact [place] at [email or web address]  or [phone]) [place] Varied levels of classes are offered Tuesdays and Thursdays at [facility].  Stretching with Verdis Frederickson weekly class is also offered for people with Parkinson's To observe a class or for more information, call [phone] or email Hezzie Bump at [email or web address] ADDITIONAL SUPPORT AND RESOURCES Well-Spring Solutions:Online Caregiver Education Opportunities:  www.well-springsolutions.org/caregiver-education/caregiver-support-group.  You may also contact Vickki Muff at [email or web address] or [phone].    Well-Spring Navigator:  [phone] program, a free service to help individuals and families through the journey of determining care for older adults.  The "Navigator" is a [employer], Education officer, museum, who will speak with a prospective client and/or loved ones to provide an assessment of the situation and a set of recommendations for a personalized care plan -- all free of charge, and whether Well-Spring Solutions offers the needed service or not. If the need is not a service we provide, we are well-connected with reputable programs in town that we can refer you to.  www.well-springsolutions.org or to speak with the Navigator, call [phone]. Family Caregiver Programming in June:  [employer] Against [employer], Thursday, June 15th 11-12:30 at 12-03-1971. [place].  Call [phone] to register

## 2022-03-26 ENCOUNTER — Other Ambulatory Visit: Payer: Self-pay | Admitting: Internal Medicine

## 2022-03-26 ENCOUNTER — Ambulatory Visit
Admission: RE | Admit: 2022-03-26 | Discharge: 2022-03-26 | Disposition: A | Discharge status: Home / Self Care | Payer: Medicare Other | Source: Ambulatory Visit | Attending: Internal Medicine | Admitting: Internal Medicine

## 2022-03-26 DIAGNOSIS — S300XXA Contusion of lower back and pelvis, initial encounter: Secondary | ICD-10-CM | POA: Diagnosis not present

## 2022-03-26 DIAGNOSIS — G20 Parkinson's disease: Secondary | ICD-10-CM | POA: Diagnosis not present

## 2022-03-26 DIAGNOSIS — R296 Repeated falls: Secondary | ICD-10-CM | POA: Diagnosis not present

## 2022-03-26 DIAGNOSIS — R0781 Pleurodynia: Secondary | ICD-10-CM | POA: Diagnosis not present

## 2022-03-26 DIAGNOSIS — R0789 Other chest pain: Secondary | ICD-10-CM | POA: Diagnosis not present

## 2022-03-26 DIAGNOSIS — S39012A Strain of muscle, fascia and tendon of lower back, initial encounter: Secondary | ICD-10-CM | POA: Diagnosis not present

## 2022-03-27 DIAGNOSIS — H25013 Cortical age-related cataract, bilateral: Secondary | ICD-10-CM | POA: Diagnosis not present

## 2022-03-27 DIAGNOSIS — H18413 Arcus senilis, bilateral: Secondary | ICD-10-CM | POA: Diagnosis not present

## 2022-03-27 DIAGNOSIS — H2513 Age-related nuclear cataract, bilateral: Secondary | ICD-10-CM | POA: Diagnosis not present

## 2022-03-27 DIAGNOSIS — H25043 Posterior subcapsular polar age-related cataract, bilateral: Secondary | ICD-10-CM | POA: Diagnosis not present

## 2022-03-27 DIAGNOSIS — H2512 Age-related nuclear cataract, left eye: Secondary | ICD-10-CM | POA: Diagnosis not present

## 2022-03-28 ENCOUNTER — Other Ambulatory Visit: Payer: Self-pay

## 2022-03-28 ENCOUNTER — Telehealth: Payer: Self-pay

## 2022-03-28 DIAGNOSIS — N390 Urinary tract infection, site not specified: Secondary | ICD-10-CM

## 2022-03-28 MED ORDER — NITROFURANTOIN MONOHYD MACRO 100 MG PO CAPS: 100.0000 mg | ORAL_CAPSULE | Freq: Every day | ORAL | 3 refills | Status: DC

## 2022-03-28 NOTE — Telephone Encounter (Signed)
Please place macrobid 100 mg po once a day for me  360 days, 90 day rx with 3 refills   Thank you

## 2022-03-28 NOTE — Telephone Encounter (Signed)
Received call from patient's spouse with question on starting macrobid per last office visit with Dr. Gale Journey on 5/30.  States the patient completed the fosfomycin as prescribed, but did not receive a prescription for macrobid.  Spouse states they had an old prescription for macrobid 100 mg once daily from a physician at [place], from 02/27/22. Patient has been taking that for the past 6 days.  Wife states patient is otherwise "doing ok", had a recent fall this past Thursday.  Will route to provider.  Binnie Kand, RN

## 2022-03-28 NOTE — Progress Notes (Signed)
Called and spoke with patient's wife. Made her aware of rx, sent in per order from Dr. Gale Journey, and confirmed pharmacy. No additional questions.  Binnie Kand, RN

## 2022-03-30 DIAGNOSIS — K219 Gastro-esophageal reflux disease without esophagitis: Secondary | ICD-10-CM | POA: Diagnosis not present

## 2022-03-30 DIAGNOSIS — I130 Hypertensive heart and chronic kidney disease with heart failure and stage 1 through stage 4 chronic kidney disease, or unspecified chronic kidney disease: Secondary | ICD-10-CM | POA: Diagnosis not present

## 2022-03-30 DIAGNOSIS — J309 Allergic rhinitis, unspecified: Secondary | ICD-10-CM | POA: Diagnosis not present

## 2022-03-30 DIAGNOSIS — H9190 Unspecified hearing loss, unspecified ear: Secondary | ICD-10-CM | POA: Diagnosis not present

## 2022-03-30 DIAGNOSIS — Z7984 Long term (current) use of oral hypoglycemic drugs: Secondary | ICD-10-CM | POA: Diagnosis not present

## 2022-03-30 DIAGNOSIS — N201 Calculus of ureter: Secondary | ICD-10-CM | POA: Diagnosis not present

## 2022-03-30 DIAGNOSIS — N3000 Acute cystitis without hematuria: Secondary | ICD-10-CM | POA: Diagnosis not present

## 2022-03-30 DIAGNOSIS — E1151 Type 2 diabetes mellitus with diabetic peripheral angiopathy without gangrene: Secondary | ICD-10-CM | POA: Diagnosis not present

## 2022-03-30 DIAGNOSIS — D72829 Elevated white blood cell count, unspecified: Secondary | ICD-10-CM | POA: Diagnosis not present

## 2022-03-30 DIAGNOSIS — N183 Chronic kidney disease, stage 3 unspecified: Secondary | ICD-10-CM | POA: Diagnosis not present

## 2022-03-30 DIAGNOSIS — E7800 Pure hypercholesterolemia, unspecified: Secondary | ICD-10-CM | POA: Diagnosis not present

## 2022-03-30 DIAGNOSIS — Z794 Long term (current) use of insulin: Secondary | ICD-10-CM | POA: Diagnosis not present

## 2022-03-30 DIAGNOSIS — R1313 Dysphagia, pharyngeal phase: Secondary | ICD-10-CM | POA: Diagnosis not present

## 2022-03-30 DIAGNOSIS — I444 Left anterior fascicular block: Secondary | ICD-10-CM | POA: Diagnosis not present

## 2022-03-30 DIAGNOSIS — I872 Venous insufficiency (chronic) (peripheral): Secondary | ICD-10-CM | POA: Diagnosis not present

## 2022-03-30 DIAGNOSIS — G20 Parkinson's disease: Secondary | ICD-10-CM | POA: Diagnosis not present

## 2022-03-30 DIAGNOSIS — E1165 Type 2 diabetes mellitus with hyperglycemia: Secondary | ICD-10-CM | POA: Diagnosis not present

## 2022-03-30 DIAGNOSIS — E1122 Type 2 diabetes mellitus with diabetic chronic kidney disease: Secondary | ICD-10-CM | POA: Diagnosis not present

## 2022-03-30 DIAGNOSIS — M1990 Unspecified osteoarthritis, unspecified site: Secondary | ICD-10-CM | POA: Diagnosis not present

## 2022-03-30 DIAGNOSIS — E871 Hypo-osmolality and hyponatremia: Secondary | ICD-10-CM | POA: Diagnosis not present

## 2022-03-30 DIAGNOSIS — I5032 Chronic diastolic (congestive) heart failure: Secondary | ICD-10-CM | POA: Diagnosis not present

## 2022-04-02 ENCOUNTER — Other Ambulatory Visit: Payer: Self-pay | Admitting: Orthopedic Surgery

## 2022-04-02 ENCOUNTER — Other Ambulatory Visit: Payer: Self-pay | Admitting: Neurology

## 2022-04-02 DIAGNOSIS — S32000A Wedge compression fracture of unspecified lumbar vertebra, initial encounter for closed fracture: Secondary | ICD-10-CM

## 2022-04-02 DIAGNOSIS — S3210XA Unspecified fracture of sacrum, initial encounter for closed fracture: Secondary | ICD-10-CM

## 2022-04-02 DIAGNOSIS — S32110A Nondisplaced Zone I fracture of sacrum, initial encounter for closed fracture: Secondary | ICD-10-CM | POA: Diagnosis not present

## 2022-04-02 DIAGNOSIS — S32040A Wedge compression fracture of fourth lumbar vertebra, initial encounter for closed fracture: Secondary | ICD-10-CM | POA: Diagnosis not present

## 2022-04-03 ENCOUNTER — Ambulatory Visit
Admission: RE | Admit: 2022-04-03 | Discharge: 2022-04-03 | Disposition: A | Discharge status: Home / Self Care | Payer: Medicare Other | Source: Ambulatory Visit | Attending: Orthopedic Surgery | Admitting: Orthopedic Surgery

## 2022-04-03 DIAGNOSIS — S3210XA Unspecified fracture of sacrum, initial encounter for closed fracture: Secondary | ICD-10-CM

## 2022-04-03 DIAGNOSIS — M545 Low back pain, unspecified: Secondary | ICD-10-CM | POA: Diagnosis not present

## 2022-04-03 DIAGNOSIS — S32000A Wedge compression fracture of unspecified lumbar vertebra, initial encounter for closed fracture: Secondary | ICD-10-CM

## 2022-04-05 DIAGNOSIS — S32018A Other fracture of first lumbar vertebra, initial encounter for closed fracture: Secondary | ICD-10-CM | POA: Diagnosis not present

## 2022-04-12 DIAGNOSIS — S32018A Other fracture of first lumbar vertebra, initial encounter for closed fracture: Secondary | ICD-10-CM | POA: Diagnosis not present

## 2022-04-16 ENCOUNTER — Ambulatory Visit
Admission: RE | Admit: 2022-04-16 | Discharge: 2022-04-16 | Disposition: A | Discharge status: Home / Self Care | Payer: Medicare Other | Source: Ambulatory Visit | Attending: Urology | Admitting: Urology

## 2022-04-16 DIAGNOSIS — K8020 Calculus of gallbladder without cholecystitis without obstruction: Secondary | ICD-10-CM | POA: Diagnosis not present

## 2022-04-16 DIAGNOSIS — N281 Cyst of kidney, acquired: Secondary | ICD-10-CM | POA: Diagnosis not present

## 2022-04-16 DIAGNOSIS — K862 Cyst of pancreas: Secondary | ICD-10-CM | POA: Diagnosis not present

## 2022-04-16 DIAGNOSIS — N2889 Other specified disorders of kidney and ureter: Secondary | ICD-10-CM | POA: Diagnosis not present

## 2022-04-16 DIAGNOSIS — D49511 Neoplasm of unspecified behavior of right kidney: Secondary | ICD-10-CM

## 2022-04-16 MED ORDER — GADOBENATE DIMEGLUMINE 529 MG/ML IV SOLN
15.0000 mL | Freq: Once | INTRAVENOUS | Status: AC | PRN
  Administered 2022-04-16: 15 mL via INTRAVENOUS

## 2022-04-18 ENCOUNTER — Telehealth: Payer: Self-pay

## 2022-04-18 DIAGNOSIS — N390 Urinary tract infection, site not specified: Secondary | ICD-10-CM | POA: Diagnosis not present

## 2022-04-18 NOTE — Telephone Encounter (Signed)
Patient wife [name] called to report patient has been sleeping a lot throughout the day, not eating or hydrating much, urine is a little dark in color and has mild odor x 4 days. Unsure if patient has had a fever due to rotating tylenol and ibuprofen for pain due to recent fall and causing fractures in his back. Patient is still taking Macrobid but Izora Gala is concerned that patient symptoms may be related to UTI. Please advise.   [place], CMA

## 2022-04-18 NOTE — Telephone Encounter (Signed)
Patient wife aware. Patient did a urine sample and took to PCP office they are running a culture on it. Patient wife will let us know the results.   [place], CMA

## 2022-04-19 ENCOUNTER — Ambulatory Visit (INDEPENDENT_AMBULATORY_CARE_PROVIDER_SITE_OTHER): Payer: Medicare Other | Admitting: Internal Medicine

## 2022-04-19 ENCOUNTER — Encounter: Payer: Self-pay | Admitting: Internal Medicine

## 2022-04-19 ENCOUNTER — Other Ambulatory Visit: Payer: Self-pay

## 2022-04-19 DIAGNOSIS — R8271 Bacteriuria: Secondary | ICD-10-CM | POA: Diagnosis not present

## 2022-04-19 DIAGNOSIS — G20 Parkinson's disease: Secondary | ICD-10-CM | POA: Diagnosis not present

## 2022-04-19 NOTE — Progress Notes (Addendum)
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   Encounter for general adult medical examination with abnormal findings 02/18/2018   Enlarged prostate 02/18/2018   Hearing loss 02/18/2018   Other long term (current) drug therapy 02/18/2018   Type 2 diabetes mellitus with hyperglycemia ([place]) 02/18/2018   HLD (hyperlipidemia)    Nephrolithiasis    Parkinson's disease ([place])    Diabetes mellitus with coincident hypertension ([place])    Syncope    Renal calculus    Diabetes ([place])    Allergic rhinitis    BPH (benign prostatic hyperplasia)    Erectile dysfunction    Dysphagia, pharyngeal phase    Edema    Ureteral calculus 02/09/2013   LEG PAIN 03/14/2010      HPI: [name] is a 76 y.o. 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 Patient's wife dropped off a urine sample a few days prior to this visit of 02/28/22 asking urology to evaluate as patient has become weaker with more falls concerning for a uti 2) his neurologist has been adjusting parkinson's meds but is wondering if it is due to secondary causes making his  parkinsonism temporarily worse rather than primary progression of his parkinsonism  She last saw his urologist on 01/03/22 during admission 3/19-24 for esbl uti. He had 4 days progressive malaise/generalized weakness and was admitted to get meropnem --> ertapenem for 2 weeks. On follow up 3/22 he was feeling better.  He is currently on nitrofurantoin for prophylaxis, since 5/16. He is getting ceftriaxone as well at the [facility] at [facility].    I reviewed his previous urine culture on epic (last one I could see was 12/31/21) 12/31/21 kleb pna pan-resistant except imipenem, but only 20k colonies 06/2021 citrobacter freundii (S cipro/bactrim/gentamycin/nitrofurantoin) and kleb pna (S imipenem but resistant to cipro/bactrim)   Care everywhere labs: 01/31/22 ucx >100k colonies/ml of kleb pna esbl (R cipro, bactrim; s ertapenem)   He has had about 3-4 episodes of these increased parkinsonism the last 2 years and all got better with abx treatment. He doesn't take over the counter medication intermittently like benadryl that could worsen urinary retention  He denies other sx of typical uti outside of the weakness/malaise and increased parkinsonism (which is mainly more falls and shuffling gait and unsteady). He hasn't had a time of these episode where he clinically watch and improve  He mentions over the last 2 years his parkinson also gets gradually worse  Since his last episode 12/31/21 uti, he had 4-5 good week baseline sx and the last several days have had worsening parkinsonism and weakness/malaise.   He is  using a fww now which is very bad for him.  He hasn't felt better since nitrofurantoin and ceftriaxone yet.   He has not had no prostate sling procedure He last had a foley catheter in 12/2021  Normally he hasn't had to straight cath himself    Meds: Sinemet 25-100 3 tablet in morning, and 2 tablet q2hours Entacapone 200 mg  tid ropinarole Lantus Acetaminophen Nitrofurantoin 5/16-c Metformin Lisinopril Omeprazole Tamsulosin Furosemide Simvastatin   ------------------------ 03/13/22 id clinic  Since hospital discharge patient hasn't felt better Still uses the walker and feeling sleepy Home health pt/ot just had their first visit -- will come twice a week at least No abdominal pain No fever/chill  Will see dr Hall Busing 6/9th for neurologic evaluation  Developed a new groin rash -- started in hospital  04/19/22 id clinic Televisit phone for concern of another uti  Wife reports patient had fallen early June and had ?back fracture. Was on narcotics but down to tylenol extra strength. Sleeping more. No urgency/frequency but dark urine. Not eating/drinking much Gets around with fww  She brought a urine to pcp yesterday that showed >20 rbc and wbc and many bacteria but no nitrite. And a bactrim rx was called in  On macrobid once a day for uti prevention  We had advised if she is concern about this should address at urgent care/ed given esbl and shouldn't delay sepsis treatment if that is the case but she wanted to discuss via phone with me prior to deciding further  Review of Systems: ROS All other ros negative      Past Medical History:  Diagnosis Date   Abnormal involuntary movement 02/18/2018   Allergic rhinitis    Arthritis    BPH (benign prostatic hyperplasia)    Diabetes ([place])    Dysphagia, pharyngeal phase    GERD diagnosed on barium swallow. Has small hiatal hernia. Symptomatically somewhat better on omeprazole but not entirely. We'll try b.i.d. therapy   Edema    1+ in both ankles, likely multifactorial including medication such as Requip   Erectile dysfunction    Staxyn 10 mg or Viagra worked well. 3 samples of Cialis 20 mg provided   GERD (gastroesophageal reflux disease)    Hypercholesteremia    Hypertension    Nephrolithiasis    Onychomycosis of toenail    April 27, 2013 - Dr.  Inocencio Homes - podiatry, was in [place] - treating with oral Lamisil and topical nail therapy   Parkinson's disease [facility])     followed by [facility]. Lezlie Octave at [facility] and [name], M.D. in [place]   Presbycusis    and tinnitus - Dr. Izora Gala - August/2013   Renal calculus    Syncope     Social History   Tobacco Use   Smoking status: Never   Smokeless tobacco: Never  Vaping Use   Vaping Use: Never used  Substance Use Topics   Alcohol use: No   Drug use: No    Family History  Problem Relation Age of Onset   Atrial fibrillation Mother    Breast cancer Mother    Emphysema Father    Heart disease Father    Cancer Brother        African [place]    Allergies  Allergen Reactions   Oxycodone Itching    This makes him itch   Pseudoephedrine Other (See Comments)   Sudafed [Pseudoephedrine Hcl]     Problems urinating    Tramadol Hcl Rash  OBJECTIVE: There were no vitals filed for this visit.  There is no height or weight on file to calculate BMI.   Physical Exam This is a telephone visit    Lab: Lab Results  Component Value Date   WBC 6.0 03/06/2022   HGB 13.5 03/06/2022   HCT 40.6 03/06/2022   MCV 89.4 03/06/2022   PLT 283 66/03/3015   Last metabolic panel Lab Results  Component Value Date   GLUCOSE 169 (H) 03/06/2022   NA 137 03/06/2022   K 3.8 03/06/2022   CL 105 03/06/2022   CO2 26 03/06/2022   BUN 18 03/06/2022   CREATININE 0.98 03/06/2022   GFRNONAA >60 03/06/2022   CALCIUM 8.7 (L) 03/06/2022   PROT 6.1 (L) 03/06/2022   ALBUMIN 2.9 (L) 03/06/2022   BILITOT 0.9 03/06/2022   ALKPHOS 81 03/06/2022   AST 18 03/06/2022   ALT <5 03/06/2022   ANIONGAP 6 03/06/2022    Microbiology:  Serology:  Imaging:   Assessment/plan: Problem List Items Addressed This Visit   None Visit Diagnoses     Parkinson disease ([place])    -  Primary   Asymptomatic bacteriuria         Discuss concept of assymptomatic bacteriuria and uti  His symptoms  although atypical could be consistent with episodes of uti worsening parkinsonism. There is no intermittent offending medications that triggers these symptoms  He has consistently grown kleb pna esbl. His current ceftriaxone and nitrofurantoin are not sensitive to this bacteria  We could try fosfamycin now and if doesn't work, will repeat culture and see if carbapenem is needed    His story suggest that over the last couple years his parkinsonism is slowing progressive but clearly these episodes of presumed uti had demonstrated reversible brief duration of worsening parkinonism   ------------ 5/30 assessment Admitted recently to get carbapenem for presumed uti but no improvement despite cultures x2 without other resistant bacteremia This now appears to be assymptomatic bacteriuria and not uti  Can keep on prophylaxis against uti although at this time not sure if he has had recurrent uti frequently; definitely at risk for it. Can finish weekly fosfomycin then go on daily nitrofurantoin  For his groin rash which is candida intetrigo, will use hydrocortisone and lotrimin cream bid otc for 2 weeks   Follow up in 4-6 weeks  Follow up with dr Sondra Come as per her discretion    04/19/2022 assessment I verified that I was speaking with the correct person using two identifiers. Due to the COVID-19 Pandemic, this service was provided via telemedicine using audio/visual media.   The patient was located at home. The provider was located in the office. The patient did consent to this visit and is aware of charges through their insurance as well as the limitations of evaluation and management by telemedicine. Other persons participating in this telemedicine service were none. Time spent on visit was greater than 20 minutes on media and in coordination of care  I was able to speak with patient's wife only I advise that someone should see the patient physically and make assessment  The patient has  been having issues with mobility due to parkinson rather. We had trialed carbepenem treatment for what was growing in his urine in 02/2022 without change. At this time I am not sure what the patient's wife reports reflect uti and he should be seen   But if fever, chill, change in mentation I advise them to bring patient to ED  Again discuss urine color/odor/presence of bacteria in urine-wbc/rbc in urine by themselves do not qualify for a uti   My clinic staff will look for availability in clinic today or tomorrow for patient to be seen and see if blood work needed as well  I also advise them that if they are concerned about uti should go to [place] first   At this time can do 7 days of bid nitrofurantoin. I would avoid bactrim at this time for many reasons and also his esbl was resistant to bactrim previously   Addendum:  I verified that I was speaking with the correct person using two identifiers. Due to the COVID-19 Pandemic/his choosing, this service was provided via telemedicine using audio/visual media.   The patient was located at home. The provider was located in the office. The patient did consent to this visit and is aware of charges through their insurance as well as the limitations of evaluation and management by telemedicine. Other persons participating in this telemedicine service were none. Time spent on visit was greater than 20 minutes on media and in coordination of care    Follow-up: No follow-ups on file.     Jabier Mutton, [place] for Infectious Disease [facility] 04/19/2022, 10:31 AM

## 2022-04-20 ENCOUNTER — Encounter: Payer: Self-pay | Admitting: Infectious Diseases

## 2022-04-20 ENCOUNTER — Other Ambulatory Visit: Payer: Self-pay

## 2022-04-20 ENCOUNTER — Emergency Department (HOSPITAL_COMMUNITY): Payer: Medicare Other

## 2022-04-20 ENCOUNTER — Ambulatory Visit: Payer: Medicare Other | Admitting: Infectious Diseases

## 2022-04-20 ENCOUNTER — Encounter (HOSPITAL_COMMUNITY): Payer: Self-pay

## 2022-04-20 ENCOUNTER — Inpatient Hospital Stay (HOSPITAL_COMMUNITY)
Admission: EM | Admit: 2022-04-20 | Discharge: 2022-04-22 | DRG: 641 | Disposition: A | Discharge status: Home Health | Payer: Medicare Other | Source: Ambulatory Visit | Attending: Family Medicine | Admitting: Family Medicine

## 2022-04-20 VITALS — BP 145/80 | HR 65 | Temp 97.4°F | Wt 198.0 lb

## 2022-04-20 DIAGNOSIS — Z8249 Family history of ischemic heart disease and other diseases of the circulatory system: Secondary | ICD-10-CM

## 2022-04-20 DIAGNOSIS — Z79899 Other long term (current) drug therapy: Secondary | ICD-10-CM

## 2022-04-20 DIAGNOSIS — Z7984 Long term (current) use of oral hypoglycemic drugs: Secondary | ICD-10-CM

## 2022-04-20 DIAGNOSIS — R339 Retention of urine, unspecified: Secondary | ICD-10-CM

## 2022-04-20 DIAGNOSIS — S22089D Unspecified fracture of T11-T12 vertebra, subsequent encounter for fracture with routine healing: Secondary | ICD-10-CM | POA: Diagnosis not present

## 2022-04-20 DIAGNOSIS — R8271 Bacteriuria: Secondary | ICD-10-CM | POA: Diagnosis not present

## 2022-04-20 DIAGNOSIS — E119 Type 2 diabetes mellitus without complications: Secondary | ICD-10-CM | POA: Diagnosis not present

## 2022-04-20 DIAGNOSIS — Z96653 Presence of artificial knee joint, bilateral: Secondary | ICD-10-CM | POA: Diagnosis present

## 2022-04-20 DIAGNOSIS — S32019D Unspecified fracture of first lumbar vertebra, subsequent encounter for fracture with routine healing: Secondary | ICD-10-CM | POA: Diagnosis not present

## 2022-04-20 DIAGNOSIS — R6251 Failure to thrive (child): Secondary | ICD-10-CM

## 2022-04-20 DIAGNOSIS — R5381 Other malaise: Secondary | ICD-10-CM | POA: Diagnosis not present

## 2022-04-20 DIAGNOSIS — Z803 Family history of malignant neoplasm of breast: Secondary | ICD-10-CM

## 2022-04-20 DIAGNOSIS — B961 Klebsiella pneumoniae [K. pneumoniae] as the cause of diseases classified elsewhere: Secondary | ICD-10-CM | POA: Diagnosis present

## 2022-04-20 DIAGNOSIS — R296 Repeated falls: Secondary | ICD-10-CM | POA: Diagnosis not present

## 2022-04-20 DIAGNOSIS — W109XXD Fall (on) (from) unspecified stairs and steps, subsequent encounter: Secondary | ICD-10-CM | POA: Diagnosis present

## 2022-04-20 DIAGNOSIS — E860 Dehydration: Principal | ICD-10-CM

## 2022-04-20 DIAGNOSIS — Z794 Long term (current) use of insulin: Secondary | ICD-10-CM | POA: Diagnosis not present

## 2022-04-20 DIAGNOSIS — S32009A Unspecified fracture of unspecified lumbar vertebra, initial encounter for closed fracture: Secondary | ICD-10-CM

## 2022-04-20 DIAGNOSIS — Z888 Allergy status to other drugs, medicaments and biological substances status: Secondary | ICD-10-CM

## 2022-04-20 DIAGNOSIS — Z825 Family history of asthma and other chronic lower respiratory diseases: Secondary | ICD-10-CM

## 2022-04-20 DIAGNOSIS — N400 Enlarged prostate without lower urinary tract symptoms: Secondary | ICD-10-CM | POA: Diagnosis present

## 2022-04-20 DIAGNOSIS — Z6825 Body mass index (BMI) 25.0-25.9, adult: Secondary | ICD-10-CM

## 2022-04-20 DIAGNOSIS — N390 Urinary tract infection, site not specified: Principal | ICD-10-CM

## 2022-04-20 DIAGNOSIS — I10 Essential (primary) hypertension: Secondary | ICD-10-CM | POA: Diagnosis present

## 2022-04-20 DIAGNOSIS — R627 Adult failure to thrive: Secondary | ICD-10-CM | POA: Insufficient documentation

## 2022-04-20 DIAGNOSIS — K219 Gastro-esophageal reflux disease without esophagitis: Secondary | ICD-10-CM | POA: Diagnosis present

## 2022-04-20 DIAGNOSIS — Z885 Allergy status to narcotic agent status: Secondary | ICD-10-CM | POA: Diagnosis not present

## 2022-04-20 DIAGNOSIS — R55 Syncope and collapse: Secondary | ICD-10-CM | POA: Diagnosis not present

## 2022-04-20 DIAGNOSIS — G20 Parkinson's disease: Secondary | ICD-10-CM | POA: Diagnosis present

## 2022-04-20 DIAGNOSIS — N179 Acute kidney failure, unspecified: Secondary | ICD-10-CM | POA: Diagnosis not present

## 2022-04-20 DIAGNOSIS — S3210XD Unspecified fracture of sacrum, subsequent encounter for fracture with routine healing: Secondary | ICD-10-CM | POA: Diagnosis not present

## 2022-04-20 DIAGNOSIS — G20A1 Parkinson's disease without dyskinesia, without mention of fluctuations: Secondary | ICD-10-CM

## 2022-04-20 DIAGNOSIS — R531 Weakness: Secondary | ICD-10-CM | POA: Diagnosis not present

## 2022-04-20 DIAGNOSIS — S32009S Unspecified fracture of unspecified lumbar vertebra, sequela: Secondary | ICD-10-CM

## 2022-04-20 DIAGNOSIS — Z87440 Personal history of urinary (tract) infections: Secondary | ICD-10-CM | POA: Diagnosis not present

## 2022-04-20 DIAGNOSIS — Z87442 Personal history of urinary calculi: Secondary | ICD-10-CM

## 2022-04-20 DIAGNOSIS — Z8619 Personal history of other infectious and parasitic diseases: Secondary | ICD-10-CM | POA: Diagnosis not present

## 2022-04-20 LAB — CBC
HCT: 41.1 % (ref 39.0–52.0)
Hemoglobin: 13.5 g/dL (ref 13.0–17.0)
MCH: 29.7 pg (ref 26.0–34.0)
MCHC: 32.8 g/dL (ref 30.0–36.0)
MCV: 90.3 fL (ref 80.0–100.0)
Platelets: 298 10*3/uL (ref 150–400)
RBC: 4.55 MIL/uL (ref 4.22–5.81)
RDW: 14 % (ref 11.5–15.5)
WBC: 9.6 10*3/uL (ref 4.0–10.5)
nRBC: 0 % (ref 0.0–0.2)

## 2022-04-20 LAB — BASIC METABOLIC PANEL
Anion gap: 9 (ref 5–15)
BUN: 24 mg/dL — ABNORMAL HIGH (ref 8–23)
CO2: 27 mmol/L (ref 22–32)
Calcium: 9.6 mg/dL (ref 8.9–10.3)
Chloride: 99 mmol/L (ref 98–111)
Creatinine, Ser: 1.25 mg/dL — ABNORMAL HIGH (ref 0.61–1.24)
GFR, Estimated: >60 mL/min (ref >60)
Glucose, Bld: 140 mg/dL — ABNORMAL HIGH (ref 70–99)
Potassium: 3.8 mmol/L (ref 3.5–5.1)
Sodium: 135 mmol/L (ref 135–145)

## 2022-04-20 LAB — GLUCOSE, CAPILLARY: Glucose-Capillary: 228 mg/dL — ABNORMAL HIGH (ref 70–99)

## 2022-04-20 MED ORDER — LISINOPRIL 5 MG PO TABS
5.0000 mg | ORAL_TABLET | Freq: Every day | ORAL | Status: DC
  Administered 2022-04-21 – 2022-04-22 (×2): 5 mg via ORAL
  Filled 2022-04-20 (×2): qty 1

## 2022-04-20 MED ORDER — SODIUM CHLORIDE 0.9 % IV BOLUS
1000.0000 mL | Freq: Once | INTRAVENOUS | Status: AC
Start: 2022-04-20 — End: 2022-04-20
  Administered 2022-04-20: 1000 mL via INTRAVENOUS

## 2022-04-20 MED ORDER — CARBIDOPA-LEVODOPA 25-100 MG PO TABS
3.0000 | ORAL_TABLET | Freq: Once | ORAL | Status: AC
Start: 2022-04-20 — End: 2022-04-20
  Administered 2022-04-20: 3 via ORAL
  Filled 2022-04-20: qty 3

## 2022-04-20 MED ORDER — ONDANSETRON HCL 4 MG PO TABS: 4.0000 mg | ORAL_TABLET | Freq: Four times a day (QID) | ORAL | Status: DC | PRN

## 2022-04-20 MED ORDER — ENTACAPONE 200 MG PO TABS
200.0000 mg | ORAL_TABLET | Freq: Three times a day (TID) | ORAL | Status: DC
  Administered 2022-04-21 – 2022-04-22 (×5): 200 mg via ORAL
  Filled 2022-04-20 (×6): qty 1

## 2022-04-20 MED ORDER — TAMSULOSIN HCL 0.4 MG PO CAPS
0.8000 mg | ORAL_CAPSULE | Freq: Every day | ORAL | Status: DC
  Administered 2022-04-21 – 2022-04-22 (×2): 0.8 mg via ORAL
  Filled 2022-04-20 (×2): qty 2

## 2022-04-20 MED ORDER — SODIUM CHLORIDE 0.9 % IV SOLN
1.0000 g | INTRAVENOUS | Status: AC
  Administered 2022-04-20: 1 g via INTRAVENOUS
  Filled 2022-04-20: qty 20

## 2022-04-20 MED ORDER — ENOXAPARIN SODIUM 40 MG/0.4ML IJ SOSY
40.0000 mg | PREFILLED_SYRINGE | INTRAMUSCULAR | Status: DC
  Administered 2022-04-20 – 2022-04-21 (×2): 40 mg via SUBCUTANEOUS
  Filled 2022-04-20 (×2): qty 0.4

## 2022-04-20 MED ORDER — ROPINIROLE HCL 1 MG PO TABS
3.0000 mg | ORAL_TABLET | Freq: Two times a day (BID) | ORAL | Status: DC
  Administered 2022-04-21 – 2022-04-22 (×3): 3 mg via ORAL
  Filled 2022-04-20: qty 3
  Filled 2022-04-20: qty 1
  Filled 2022-04-20: qty 6
  Filled 2022-04-20: qty 3
  Filled 2022-04-20: qty 6

## 2022-04-20 MED ORDER — NAPROXEN SODIUM 220 MG PO TABS: 440.0000 mg | ORAL_TABLET | Freq: Every day | ORAL | Status: DC | PRN

## 2022-04-20 MED ORDER — POTASSIUM CHLORIDE CRYS ER 20 MEQ PO TBCR
20.0000 meq | EXTENDED_RELEASE_TABLET | Freq: Every day | ORAL | Status: DC
  Administered 2022-04-21 – 2022-04-22 (×2): 20 meq via ORAL
  Filled 2022-04-20 (×2): qty 1

## 2022-04-20 MED ORDER — SODIUM CHLORIDE 0.9 % IV SOLN
1.0000 g | Freq: Once | INTRAVENOUS | Status: DC
  Filled 2022-04-20 (×2): qty 20

## 2022-04-20 MED ORDER — SODIUM CHLORIDE 0.9 % IV SOLN
1.0000 g | Freq: Three times a day (TID) | INTRAVENOUS | Status: DC
  Administered 2022-04-21 (×2): 1 g via INTRAVENOUS
  Filled 2022-04-20 (×3): qty 20

## 2022-04-20 MED ORDER — NAPROXEN 250 MG PO TABS: 500.0000 mg | ORAL_TABLET | Freq: Every day | ORAL | Status: DC | PRN

## 2022-04-20 MED ORDER — CARBIDOPA-LEVODOPA 25-100 MG PO TABS: 3.0000 | ORAL_TABLET | Freq: Four times a day (QID) | ORAL | Status: DC

## 2022-04-20 MED ORDER — FUROSEMIDE 20 MG PO TABS
20.0000 mg | ORAL_TABLET | Freq: Every day | ORAL | Status: DC
  Administered 2022-04-21 – 2022-04-22 (×2): 20 mg via ORAL
  Filled 2022-04-20 (×2): qty 1

## 2022-04-20 MED ORDER — INSULIN GLARGINE-YFGN 100 UNIT/ML ~~LOC~~ SOLN
15.0000 [IU] | Freq: Every day | SUBCUTANEOUS | Status: DC
  Administered 2022-04-20 – 2022-04-21 (×2): 15 [IU] via SUBCUTANEOUS
  Filled 2022-04-20 (×3): qty 0.15

## 2022-04-20 MED ORDER — ROPINIROLE HCL 1 MG PO TABS
1.5000 mg | ORAL_TABLET | Freq: Every day | ORAL | Status: DC
  Administered 2022-04-21 (×2): 1.5 mg via ORAL
  Filled 2022-04-20 (×2): qty 1

## 2022-04-20 MED ORDER — SODIUM CHLORIDE 0.9 % IV SOLN: INTRAVENOUS | Status: DC

## 2022-04-20 MED ORDER — ONDANSETRON HCL 4 MG/2ML IJ SOLN: 4.0000 mg | Freq: Four times a day (QID) | INTRAMUSCULAR | Status: DC | PRN

## 2022-04-20 MED ORDER — ACETAMINOPHEN 500 MG PO TABS
1000.0000 mg | ORAL_TABLET | Freq: Four times a day (QID) | ORAL | Status: DC | PRN
Start: 2022-04-20 — End: 2022-04-22
  Administered 2022-04-22: 1000 mg via ORAL
  Filled 2022-04-20: qty 2

## 2022-04-20 MED ORDER — CARBIDOPA-LEVODOPA 25-100 MG PO TABS
3.0000 | ORAL_TABLET | Freq: Every day | ORAL | Status: DC
  Administered 2022-04-21 – 2022-04-22 (×2): 3 via ORAL
  Filled 2022-04-20 (×2): qty 3

## 2022-04-20 MED ORDER — CARBIDOPA-LEVODOPA 25-100 MG PO TABS
2.0000 | ORAL_TABLET | Freq: Every day | ORAL | Status: DC
  Administered 2022-04-21 – 2022-04-22 (×7): 2 via ORAL
  Filled 2022-04-20 (×8): qty 2

## 2022-04-20 NOTE — Hospital Course (Signed)
[name] is a 76 y.o. 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  Patient has difficulty specifying how much time, but has recently become weaker, more tired, "sleeping a lot", "talking out of his head", having decreased appetite, decreased urine output, and dark malodorous urine.  Patient had urinalysis at his PCPs office that showed pyuria, RBCs, bacteria and squames.  Was seen by infectious disease by phone on 7/6 recommended to be seen in person.  Seen in person by infectious disease on the day of admission, recommended to go to the ER for further evaluation.  In the ER, afebrile, hemodynamically stable.  CXR clear.  WBC normal.  Creatinine 1.2 from baseline 0.9, and BUN elevated.  EDP ordered meropenem and requested admission.

## 2022-04-20 NOTE — Assessment & Plan Note (Addendum)
He tolerated meropenem --> ertapenem in the past. Previously resistant to bactrim. Has also had E faecalis in the past.   Abnormal urinalysis on 7/5 with PCP with urine culture still pending.  He does not report any urinary symptoms but family is concerned he is minimizing sx.   In my opinion the urinalysis that was collected looks contaminated with moderate squamous epithelial cells and discussed that this may not reflect actual infection. Given subjective sweating/chills needs further evaluation.

## 2022-04-20 NOTE — Assessment & Plan Note (Addendum)
BP slightly high - Continue lisinopril and lasix

## 2022-04-20 NOTE — Progress Notes (Signed)
Pharmacy Antibiotic Note  [name] is a 76 y.o. male admitted on 04/20/2022 with malaise. Pt has PMH significant for ESBL+ UTI and is currently followed by ID outpatient, who sent him to ED for evaluation.   Pharmacy has been consulted for meropenem dosing.  Today, 04/20/22 -WBC WNL -SCr 1.25, CrCl ~55 mL/min -Afebrile  Plan: Meropenem 1 g IV q8h Follow renal function and culture data    Temp (24hrs), Avg:97.5 F (36.4 C), Min:97.4 F (36.3 C), Max:97.5 F (36.4 C)  Recent Labs  Lab 04/20/22 1358  WBC 9.6  CREATININE 1.25*    Estimated Creatinine Clearance: 56 mL/min (A) (by C-G formula based on SCr of 1.25 mg/dL (H)).    Allergies  Allergen Reactions   Oxycodone Itching    This makes him itch   Pseudoephedrine Other (See Comments)   Sudafed [Pseudoephedrine Hcl]     Problems urinating    Tramadol Hcl Rash    Antimicrobials this admission: Meropenem 7/7 >>   Dose adjustments this admission:  Microbiology results: 7/7 BCx:  7/7 UCx:    Lenis Noon, PharmD 04/20/2022 6:21 PM

## 2022-04-20 NOTE — Assessment & Plan Note (Signed)
Presumably this is due to both BPH and neurogenic bladder.

## 2022-04-20 NOTE — Progress Notes (Signed)
Patient: [name]  DOB: [birth year] MRN: [id number] PCP: Eric Huddle, MD     Subjective:  [name] is a 76 y.o. male with multiple medical problems including Parkinson's disease here with his wife, [name] for evaluation of concern over recurrent UTI.   She called the clinic on 7/5 to report [name] had been sleeping a lot throughout the day, low food/drink intake and urine became dark with a slight odor the last 4 days prior to the call. No measured fevers as he has been on tylenol/ibuprofen for pain management after vertebral fractures with recent fall. Has been taking Nitrofurantoin daily for prophylaxis. 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  Urine was sent to PCP office on 7/6 with initial urinalysis showing 2+ leukocyte esterase, 3+ blood, 1+ protein, trace ketones, > 20RBC, > 20 WBC, moderate epithelial cells and abundant bacteria. PCP initially called in bactrim but [name] spoke with Dr. Gale Le yesterday and recommended to increase the macrobid to BID and not do the bactrim that was called in as it was previously resistant to this on other cultures.   Over the last several days according to his family he has been disoriented, "talking out of his head", not eating/drinking much, weaker and not doing well. They are concerned he has bad infection again and needs IV antibiotics with his history. He is having sweats/chills and low grade temps on round the clock antipyretics.   Switched back and forth to tylenol and aleve to help minimize the side effects. Gingerale is all he has had today. He last voided at 8:30 / 9:00 so far and was dark orange color. He had something to eat this morning about 2-3 tablespoons around 1:00 and some sugar free cookies. Had a half of an egg salad.    Review of Systems  Constitutional:  Positive for chills and diaphoresis. Negative for fever.  HENT:  Negative for tinnitus.   Eyes:  Negative for blurred vision and photophobia.  Respiratory:   Negative for cough and sputum production.   Cardiovascular:  Negative for chest pain.  Gastrointestinal:  Negative for diarrhea, nausea and vomiting.  Genitourinary:  Negative for dysuria.       Decreased frequency of urine production, dark, malodorous   Skin:  Negative for rash.  Neurological:  Negative for headaches.     Past Medical History:  Diagnosis Date   Abnormal involuntary movement 02/18/2018   Allergic rhinitis    Arthritis    BPH (benign prostatic hyperplasia)    Diabetes ([place])    Dysphagia, pharyngeal phase    GERD diagnosed on barium swallow. Has small hiatal hernia. Symptomatically somewhat better on omeprazole but not entirely. We'll try b.i.d. therapy   Edema    1+ in both ankles, likely multifactorial including medication such as Requip   Erectile dysfunction    Staxyn 10 mg or Viagra worked well. 3 samples of Cialis 20 mg provided   GERD (gastroesophageal reflux disease)    Hypercholesteremia    Hypertension    Nephrolithiasis    Onychomycosis of toenail    April 27, 2013 - Dr. Inocencio Le - podiatry, was in [place] - treating with oral Lamisil and topical nail therapy   Parkinson's disease [facility])     followed by Dr. Lezlie Le at [facility] and [name], M.D. in [facility]   Presbycusis    and tinnitus - Dr. Izora Le - August/2013   Renal calculus    Syncope  Outpatient Medications Prior to Visit  Medication Sig Dispense Refill   acetaminophen (TYLENOL) 500 MG tablet Take 1,000 mg by mouth every 6 (six) hours as needed for fever.     Ascorbic Acid (VITAMIN C) 100 MG tablet Take 100 mg by mouth daily.     carbidopa-levodopa (SINEMET IR) 25-100 MG tablet Take 3 tablets by mouth 4 (four) times daily. (Patient taking differently: Take 3 tablets by mouth 4 (four) times daily. 13 pills  day every) 1080 tablet 1   cholecalciferol (VITAMIN D) 1000 UNITS tablet Take 2,000 Units by mouth daily.     clotrimazole (LOTRIMIN AF) 1 % cream Apply 1 application.  topically 2 (two) times daily. 30 g 3   entacapone (COMTAN) 200 MG tablet TAKE 1 TABLET BY MOUTH 3 TIMES DAILY (WITH FIRST THREE DOSAGES OF CARBIDOPA/LEVODOPA). 270 tablet 0   fosfomycin (MONUROL) 3 g PACK Take 3 g by mouth once.     furosemide (LASIX) 20 MG tablet Take 2 tablets (40 mg total) by mouth daily. (Patient taking differently: Take 20 mg by mouth daily. Taken 20 mg daily)     GLOBAL EASE INJECT PEN NEEDLES 31G X 5 MM MISC Inject into the skin.     hydrocortisone cream 0.5 % Apply 1 application. topically 2 (two) times daily. 30 g 3   LANTUS SOLOSTAR 100 UNIT/ML Solostar Pen 15 Units at bedtime.     lisinopril (ZESTRIL) 5 MG tablet Take 5 mg by mouth daily.     metFORMIN (GLUCOPHAGE) 500 MG tablet Take 250 mg by mouth 2 (two) times daily with a meal. Taken it daily     methenamine (MANDELAMINE) 1 g tablet Take 1 tablet (1,000 mg total) by mouth 2 (two) times daily. Take with any vitamin c, twice a day for uti prevention 180 tablet 3   naproxen sodium (ALEVE) 220 MG tablet Take 440 mg by mouth daily as needed (fever).     nitrofurantoin, macrocrystal-monohydrate, (MACROBID) 100 MG capsule Take 1 capsule (100 mg total) by mouth daily. (Patient taking differently: Take 100 mg by mouth 2 (two) times daily.) 90 capsule 3   omeprazole (PRILOSEC) 20 MG capsule Take 20 mg by mouth daily as needed (acid reflux).     potassium chloride SA (KLOR-CON) 20 MEQ tablet Take 20 mEq by mouth daily.     rOPINIRole (REQUIP) 3 MG tablet Take 1 tablet in the morning, Take  1 in the afternoon, Take a  half tablet in the evening (Patient taking differently: Take 1.5-3 mg by mouth See admin instructions. Take 1 tablet in the morning, Take  1 in the afternoon, Take a  half tablet in the evening) 225 tablet 1   tamsulosin (FLOMAX) 0.4 MG CAPS capsule Take 0.8 mg by mouth daily. 2 tabs daily  11   triamcinolone (NASACORT) 55 MCG/ACT AERO nasal inhaler Place 2 sprays into the nose daily as needed (SOB).     vitamin  B-12 (CYANOCOBALAMIN) 1000 MCG tablet Take 1,000 mcg by mouth daily.     No facility-administered medications prior to visit.     Allergies  Allergen Reactions   Oxycodone Itching    This makes him itch   Pseudoephedrine Other (See Comments)   Sudafed [Pseudoephedrine Hcl]     Problems urinating    Tramadol Hcl Rash    Social History   Tobacco Use   Smoking status: Never   Smokeless tobacco: Never  Vaping Use   Vaping Use: Never used  Substance Use Topics  Alcohol use: No   Drug use: No    Family History  Problem Relation Age of Onset   Atrial fibrillation Mother    Breast cancer Mother    Emphysema Father    Heart disease Father    Cancer Brother        African Burkitt    Objective:   Vitals:   04/20/22 1012  BP: (!) 145/80  Pulse: 65  Temp: (!) 97.4 F (36.3 C)  TempSrc: Temporal  Weight: 198 lb (89.8 kg)   Body mass index is 26.85 kg/m.  Physical Exam Vitals reviewed.  Constitutional:      Appearance: Normal appearance. He is not ill-appearing.  HENT:     Head: Normocephalic.     Mouth/Throat:     Mouth: Mucous membranes are moist.     Pharynx: Oropharynx is clear.  Eyes:     General: No scleral icterus. Cardiovascular:     Rate and Rhythm: Normal rate.  Pulmonary:     Effort: Pulmonary effort is normal.  Musculoskeletal:        General: Normal range of motion.     Cervical back: Normal range of motion.  Skin:    Coloration: Skin is not jaundiced or pale.  Neurological:     Mental Status: He is alert and oriented to person, place, and time.  Psychiatric:        Mood and Affect: Mood normal.        Judgment: Judgment normal.     Lab Results: Lab Results  Component Value Date   WBC 6.0 03/06/2022   HGB 13.5 03/06/2022   HCT 40.6 03/06/2022   MCV 89.4 03/06/2022   PLT 283 03/06/2022    Lab Results  Component Value Date   CREATININE 0.98 03/06/2022   BUN 18 03/06/2022   NA 137 03/06/2022   K 3.8 03/06/2022   CL 105  03/06/2022   CO2 26 03/06/2022    Lab Results  Component Value Date   ALT <5 03/06/2022   AST 18 03/06/2022   ALKPHOS 81 03/06/2022   BILITOT 0.9 03/06/2022     Assessment & Plan:   Problem List Items Addressed This Visit       Unprioritized   Failure to thrive in adult - Primary    Long discussion about possible contributors today - dehydration, systemic infection, medication s/e.  He has a history of difficult to treat urinary organisms causing UTI in the past. I most suspect dehydration mediated given he as only had 6 oz (maybe) of fluids in the last 16 hours but the sweating / chills argue that he could have bacterial infection contributing.   He has not had any improvement on Macrobid BID and his family feels as if he has really declined the last week again. This has been cyclic since March where every 6 -8 weeks he requires inpatient care. Would recommend ER evaluation to address more urgently his condition so we can determine next steps. While he is hemodynamically stable today, I don't have an ability to do any urgent blood work for him and expressed my concern that it will only delay care. His family understood and agreed. Mr. Wilbon eventually did agree that he could probably do a few days in the hospital.   Needs creatinine as well given poor oral intake and round the clock NSAIDS for back pain protocol. AKI is reasonable consideration here as well.   Alternatively may be progression of Parkinson's diagnosis. He has had  several admissions and fall resulting in back fracture as well --- all of these can contribute to not "bouncing back" from hospital stays recently as well.       History of ESBL Klebsiella pneumoniae infection    He tolerated meropenem --> ertapenem in the past. Previously resistant to bactrim. Has also had E faecalis in the past.   Abnormal urinalysis on 7/5 with PCP with urine culture still pending.  He does not report any urinary symptoms but family is  concerned he is minimizing sx.   In my opinion the urinalysis that was collected looks contaminated with moderate squamous epithelial cells and discussed that this may not reflect actual infection. Given subjective sweating/chills needs further evaluation.        Patient will go to [facility] ER for evaluation    Janene Madeira, MSN, NP-C [facility] [place] Pager: [phone] Office: [phone]  04/20/22  12:57 PM

## 2022-04-20 NOTE — H&P (Signed)
History and Physical    Patient: [name]:[id number] DOB: Oct 31, 1945 DOA: 04/20/2022 DOS: the patient was seen and examined on 04/20/2022 PCP: Josetta Huddle, MD  Patient coming from: Home  Chief Complaint:  Chief Complaint  Patient presents with   Urinary Tract Infection       HPI:  [name] is a 76 y.o. 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  Patient has difficulty specifying how much time, but has recently become weaker, more tired, "sleeping a lot", "talking out of his head", having decreased appetite, decreased urine output, and dark malodorous urine.  Patient had urinalysis at his PCPs office that showed pyuria, RBCs, bacteria and squames.  Was seen by infectious disease by phone on 7/6 recommended to be seen in person.  Seen in person by infectious disease on the day of admission, recommended to go to the ER for further evaluation.  In the ER, afebrile, hemodynamically stable.  CXR clear.  WBC normal.  Creatinine 1.2 from baseline 0.9, and BUN elevated.  EDP ordered meropenem and requested admission.      Review of Systems  Constitutional:  Positive for chills, fever and malaise/fatigue.  Respiratory:  Negative for cough, sputum production and shortness of breath.   Cardiovascular:  Positive for leg swelling (chronic).  Gastrointestinal:  Negative for abdominal pain, nausea and vomiting.  Genitourinary:  Negative for dysuria, flank pain, frequency, hematuria and urgency.  Musculoskeletal:  Positive for back pain (since vertebral fracture).  Neurological:  Positive for weakness. Negative for dizziness, tingling, tremors, sensory change, speech change, focal weakness, seizures, loss of consciousness and headaches.     Past Medical History:  Diagnosis Date   Abnormal involuntary movement 02/18/2018   Allergic rhinitis    Arthritis    BPH (benign prostatic  hyperplasia)    Diabetes ([place])    Dysphagia, pharyngeal phase    GERD diagnosed on barium swallow. Has small hiatal hernia. Symptomatically somewhat better on omeprazole but not entirely. We'll try b.i.d. therapy   Edema    1+ in both ankles, likely multifactorial including medication such as Requip   Erectile dysfunction    Staxyn 10 mg or Viagra worked well. 3 samples of Cialis 20 mg provided   GERD (gastroesophageal reflux disease)    Hypercholesteremia    Hypertension    Nephrolithiasis    Onychomycosis of toenail    April 27, 2013 - Dr. Inocencio Homes - podiatry, was in [place] - treating with oral Lamisil and topical nail therapy   Parkinson's disease [facility])     followed by Dr. Lezlie Octave at [facility] and [name], M.D. in [place]   Presbycusis    and tinnitus - Dr. Izora Gala - August/2013   Renal calculus    Syncope    Past Surgical History:  Procedure Laterality Date   HAND SURGERY     INGUINAL HERNIA REPAIR Left 11/04/2015   Procedure: LEFT INGUINAL HERNIA REPAIR WITH MESH;  Surgeon: Armandina Gemma, MD;  Location: [facility];  Service: General;  Laterality: Left;   INSERTION OF MESH Left 11/04/2015   Procedure: INSERTION OF MESH;  Surgeon: Armandina Gemma, MD;  Location: [facility];  Service: General;  Laterality: Left;   JOINT REPLACEMENT Bilateral    KNEE SURGERY     TOTAL KNEE ARTHROPLASTY     Social History:  reports that he has never smoked. He  has never used smokeless tobacco. He reports that he does not drink alcohol and does not use drugs.  Allergies  Allergen Reactions   Oxycodone Itching    This makes him itch   Pseudoephedrine Other (See Comments)   Sudafed [Pseudoephedrine Hcl]     Problems urinating    Tramadol Hcl Rash    Family History  Problem Relation Age of Onset   Atrial fibrillation Mother    Breast cancer Mother    Emphysema Father    Heart disease Father    Cancer Brother        African Burkitt    Prior to  Admission medications   Medication Sig Start Date End Date Taking? Authorizing Provider  acetaminophen (TYLENOL) 500 MG tablet Take 1,000 mg by mouth every 6 (six) hours as needed for fever.    [provider]  Ascorbic Acid (VITAMIN C) 100 MG tablet Take 100 mg by mouth daily.    [provider]  carbidopa-levodopa (SINEMET IR) 25-100 MG tablet Take 3 tablets by mouth 4 (four) times daily. Patient taking differently: Take 3 tablets by mouth 4 (four) times daily. 13 pills  day every 03/23/22   [name], [name], DO  cholecalciferol (VITAMIN D) 1000 UNITS tablet Take 2,000 Units by mouth daily.    [provider]  clotrimazole (LOTRIMIN AF) 1 % cream Apply 1 application. topically 2 (two) times daily. 03/13/22   [name], [name], MD  entacapone (COMTAN) 200 MG tablet TAKE 1 TABLET BY MOUTH 3 TIMES DAILY (WITH FIRST THREE DOSAGES OF CARBIDOPA/LEVODOPA). 04/02/22   [name], [name], DO  fosfomycin (MONUROL) 3 g PACK Take 3 g by mouth once.    [provider]  furosemide (LASIX) 20 MG tablet Take 2 tablets (40 mg total) by mouth daily. Patient taking differently: Take 20 mg by mouth daily. Taken 20 mg daily 03/07/22   [name], MD  GLOBAL EASE INJECT PEN NEEDLES 31G X 5 MM MISC Inject into the skin. 05/09/21   [provider]  hydrocortisone cream 0.5 % Apply 1 application. topically 2 (two) times daily. 03/13/22   [name], [name], MD  LANTUS SOLOSTAR 100 UNIT/ML Solostar Pen 15 Units at bedtime. 05/03/21   [provider]  lisinopril (ZESTRIL) 5 MG tablet Take 5 mg by mouth daily. 12/06/21   [provider]  metFORMIN (GLUCOPHAGE) 500 MG tablet Take 250 mg by mouth 2 (two) times daily with a meal. Taken it daily    [provider]  methenamine (MANDELAMINE) 1 g tablet Take 1 tablet (1,000 mg total) by mouth 2 (two) times daily. Take with any vitamin c, twice a day for uti prevention 03/13/22 03/08/23  [name], [name], MD  naproxen sodium (ALEVE) 220 MG tablet  Take 440 mg by mouth daily as needed (fever).    [provider]  nitrofurantoin, macrocrystal-monohydrate, (MACROBID) 100 MG capsule Take 1 capsule (100 mg total) by mouth daily. Patient taking differently: Take 100 mg by mouth 2 (two) times daily. 03/28/22 03/23/23  [name], [name], MD  omeprazole (PRILOSEC) 20 MG capsule Take 20 mg by mouth daily as needed (acid reflux).    [provider]  potassium chloride SA (KLOR-CON) 20 MEQ tablet Take 20 mEq by mouth daily. 06/16/21   [provider]  rOPINIRole (REQUIP) 3 MG tablet Take 1 tablet in the morning, Take  1 in the afternoon, Take a  half tablet in the evening Patient taking differently: Take 1.5-3 mg by  mouth See admin instructions. Take 1 tablet in the morning, Take  1 in the afternoon, Take a  half tablet in the evening 02/27/22   [name], [name], DO  tamsulosin (FLOMAX) 0.4 MG CAPS capsule Take 0.8 mg by mouth daily. 2 tabs daily 01/24/18   [provider]  triamcinolone (NASACORT) 55 MCG/ACT AERO nasal inhaler Place 2 sprays into the nose daily as needed (SOB).    [provider]  vitamin B-12 (CYANOCOBALAMIN) 1000 MCG tablet Take 1,000 mcg by mouth daily.    [provider]    Physical Exam: Vitals:   04/20/22 1500 04/20/22 1515 04/20/22 1530 04/20/22 1545  BP: (!) 142/72 (!) 146/73 (!) 156/70 (!) 143/77  Pulse:   62 63  Resp: 11 10 (!) 9 16  Temp:      TempSrc:      SpO2:   98% 100%   Adult male, lying in bed, no acute distress, interactive appears benign No cervical or supraclavicular lymphadenopathy, no neck masses, trachea midline Oropharynx moist, no oral lesions, dentition and lips normal RRR, no murmurs, JVP normal, 1+ lower extremity edema Respiratory rate normal, lungs clear without rales or wheezes Abdomen soft without tenderness palpation or guarding in all quadrants, no ascites or distention Attention normal, affect flat, judgment and insight appear normal, he is oriented to  person, place, time, and situation. he has general bradykinesia, slowed responses.        Data Reviewed: Basic metabolic panel notable for creatinine 1.2 from baseline 1, BUN elevated Complete blood count normal Chest x-ray personally reviewed, shows no airspace disease or opacity     Assessment and Plan: * Malaise The patient has had 2 episodes in the past 6 months in which she had similar symptoms (fatigue, decreased energy, malaise, and falls) which reports diagnosis ESBL Klebsiella UTI treated with antibiotics until he felt like he was back to his baseline.  Patient expresses that he believes this started about a year ago when he had multiple urinary catheters placed, he suspects that he had drug-resistant Klebsiella introduced at that time and that "we just haven't gotten rid of it yet".  - Consult ID - IV fluids - Obtain urine culture - Discussed with ID, they recommend empiric Meropenem  Closed lumbar vertebral fracture (HCC) Patient fell on the stairs six weeks ago and sustained a vertebral fracture (anterior and middle columns of the L1 vertebra with 4 mm bony retropulsion and nondisplaced fracture of the T12 spinous process and mildly displaced horizontally oriented fracture of the sacrum at the S3) This was evaluated by Dr. Ronnald Ramp who recommended conservative management only - PT eval - LSO brace when out of bed - Avoid opiates (patient request)  Chronic retention of urine Presumably this is due to both BPH and neurogenic bladder.  HTN (hypertension) BP high normal - Continue lisinopril and lasix  Diabetes (HCC) - Continue glargine - Hold metformin - Start SS corrections  Parkinson's disease (HCC) - Continue home Sinemet, entacapone         Advance Care Planning: Full code, discussed with wife  Consults: Infectious disease  Family Communication: Wife at the bedside  Severity of Illness: The appropriate patient status for this patient is  INPATIENT. Inpatient status is judged to be reasonable and necessary in order to provide the required intensity of service to ensure the patient's safety. The patient's presenting symptoms, physical exam findings, and initial radiographic and laboratory data in the context of their chronic comorbidities is felt to  place them at high risk for further clinical deterioration. Furthermore, it is not anticipated that the patient will be medically stable for discharge from the hospital within 2 midnights of admission.   * I certify that at the point of admission it is my clinical judgment that the patient will require inpatient hospital care spanning beyond 2 midnights from the point of admission due to high intensity of service, high risk for further deterioration and high frequency of surveillance required.*  Author: Edwin Dada, MD 04/20/2022 5:19 PM  For on call review [email or web address].

## 2022-04-20 NOTE — Assessment & Plan Note (Signed)
The patient has had 2 episodes in the past 6 months in which she had similar symptoms (fatigue, decreased energy, malaise, and falls) which reports diagnosis ESBL Klebsiella UTI treated with antibiotics until he felt like he was back to his baseline.  Patient expresses that he believes this started about a year ago when he had multiple urinary catheters placed, he suspects that he had drug-resistant Klebsiella introduced at that time and that "we just haven't gotten rid of it yet".  To correct my documentation in the H&P, BP colectomy infectious disease-recommended antibiotics.  My initial communication with Dr. Elba Barman, he expressed it was his understanding this was the plan as well, but in discussion with Janene Madeira, it appeared that there was not a consensus about treating his current symptoms as UTI.  The patient has no localizing symptoms of UTI, no fever and no leukocytosis. - Cntineu IV fluids - Follow urine culture - Continue Meropenem for now and consult ID to follow

## 2022-04-20 NOTE — ED Provider Notes (Signed)
[facility] DEPT Provider Note   CSN: [id number] Arrival date & time: 04/20/22  1136     History  Chief Complaint  Patient presents with   Urinary Tract Infection    [name] is a 76 y.o. male.  HPI  75 year old male with history of Parkinson's, history of multiple recent UTIs presents today with report of abnormal urinalysis obtained in primary care office on Tuesday.  Wife reports that he has had multiple falls.  He has had some low-grade fever and chills.  He was seen in infectious disease clinic today.  He has a history of ESBL producing organism in urine.  Report of initial urinalysis on 7 6 was 2+ LE, 3+ blood, trace ketones, greater than 20 red blood cells and greater than 20 white blood cells.  Patient was initially started on Bactrim, but infectious disease advised to increase/change to Macrobid twice daily.  Family has reports that he has been increasingly weak and having decreasing p.o.  Home Medications Prior to Admission medications   Medication Sig Start Date End Date Taking? Authorizing Provider  acetaminophen (TYLENOL) 500 MG tablet Take 1,000 mg by mouth every 6 (six) hours as needed for fever.    [provider]  Ascorbic Acid (VITAMIN C) 100 MG tablet Take 100 mg by mouth daily.    [provider]  carbidopa-levodopa (SINEMET IR) 25-100 MG tablet Take 3 tablets by mouth 4 (four) times daily. Patient taking differently: Take 3 tablets by mouth 4 (four) times daily. 13 pills  day every 03/23/22   [name], [name], DO  cholecalciferol (VITAMIN D) 1000 UNITS tablet Take 2,000 Units by mouth daily.    [provider]  clotrimazole (LOTRIMIN AF) 1 % cream Apply 1 application. topically 2 (two) times daily. 03/13/22   [name], [name], MD  entacapone (COMTAN) 200 MG tablet TAKE 1 TABLET BY MOUTH 3 TIMES DAILY (WITH FIRST THREE DOSAGES OF CARBIDOPA/LEVODOPA). 04/02/22   [name], [name], DO  fosfomycin (MONUROL) 3 g PACK Take 3 g  by mouth once.    [provider]  furosemide (LASIX) 20 MG tablet Take 2 tablets (40 mg total) by mouth daily. Patient taking differently: Take 20 mg by mouth daily. Taken 20 mg daily 03/07/22   [name], MD  GLOBAL EASE INJECT PEN NEEDLES 31G X 5 MM MISC Inject into the skin. 05/09/21   [provider]  hydrocortisone cream 0.5 % Apply 1 application. topically 2 (two) times daily. 03/13/22   [name], [name], MD  LANTUS SOLOSTAR 100 UNIT/ML Solostar Pen 15 Units at bedtime. 05/03/21   [provider]  lisinopril (ZESTRIL) 5 MG tablet Take 5 mg by mouth daily. 12/06/21   [provider]  metFORMIN (GLUCOPHAGE) 500 MG tablet Take 250 mg by mouth 2 (two) times daily with a meal. Taken it daily    [provider]  methenamine (MANDELAMINE) 1 g tablet Take 1 tablet (1,000 mg total) by mouth 2 (two) times daily. Take with any vitamin c, twice a day for uti prevention 03/13/22 03/08/23  [name], [name], MD  naproxen sodium (ALEVE) 220 MG tablet Take 440 mg by mouth daily as needed (fever).    [provider]  nitrofurantoin, macrocrystal-monohydrate, (MACROBID) 100 MG capsule Take 1 capsule (100 mg total) by mouth daily. Patient taking differently: Take 100 mg by mouth 2 (two) times daily. 03/28/22 03/23/23  [name], [name], MD  omeprazole (PRILOSEC) 20 MG capsule Take 20 mg  by mouth daily as needed (acid reflux).    [provider]  potassium chloride SA (KLOR-CON) 20 MEQ tablet Take 20 mEq by mouth daily. 06/16/21   [provider]  rOPINIRole (REQUIP) 3 MG tablet Take 1 tablet in the morning, Take  1 in the afternoon, Take a  half tablet in the evening Patient taking differently: Take 1.5-3 mg by mouth See admin instructions. Take 1 tablet in the morning, Take  1 in the afternoon, Take a  half tablet in the evening 02/27/22   [name], [name], DO  tamsulosin (FLOMAX) 0.4 MG CAPS capsule Take 0.8 mg by mouth daily. 2 tabs daily 01/24/18   [provider]  triamcinolone (NASACORT) 55 MCG/ACT AERO nasal inhaler Place 2 sprays into the nose daily as needed (SOB).    [provider]  vitamin B-12 (CYANOCOBALAMIN) 1000 MCG tablet Take 1,000 mcg by mouth daily.    [provider]      Allergies    Oxycodone, Pseudoephedrine, Sudafed [pseudoephedrine hcl], and Tramadol hcl    Review of Systems   Review of Systems  Physical Exam Updated Vital Signs BP (!) 143/77   Pulse 63   Temp (!) 97.5 F (36.4 C) (Oral)   Resp 16   SpO2 100%  Physical Exam Vitals and nursing note reviewed.  Constitutional:      Appearance: Normal appearance.  HENT:     Head: Normocephalic.     Right Ear: External ear normal.     Left Ear: External ear normal.     Nose: Nose normal.     Mouth/Throat:     Mouth: Mucous membranes are dry.  Eyes:     Pupils: Pupils are equal, round, and reactive to light.  Cardiovascular:     Rate and Rhythm: Normal rate.     Pulses: Normal pulses.  Pulmonary:     Effort: Pulmonary effort is normal.     Breath sounds: Normal breath sounds.  Abdominal:     General: Abdomen is flat. Bowel sounds are normal.     Palpations: Abdomen is soft.  Musculoskeletal:        General: Normal range of motion.     Cervical back: Normal range of motion.  Skin:    General: Skin is warm.     Capillary Refill: Capillary refill takes less than 2 seconds.  Neurological:     General: No focal deficit present.     Mental Status: He is alert.  Psychiatric:        Mood and Affect: Mood normal.     ED Results / Procedures / Treatments   Labs (all labs ordered are listed, but only abnormal results are displayed) Labs Reviewed  BASIC METABOLIC PANEL - Abnormal; Notable for the following components:      Result Value   Glucose, Bld 140 (*)    BUN 24 (*)    Creatinine, Ser 1.25 (*)    All other components within normal limits  CULTURE, BLOOD (ROUTINE X 2)  CULTURE, BLOOD (ROUTINE X 2)  URINE CULTURE  CBC   URINALYSIS, ROUTINE W REFLEX MICROSCOPIC    EKG None  Radiology No results found.  Procedures Procedures    Medications Ordered in ED Medications  meropenem (MERREM) 1 g in sodium chloride 0.9 % 100 mL IVPB (has no administration in time range)  sodium chloride 0.9 % bolus 1,000 mL (has no administration in time range)    ED Course/ Medical Decision Making/ A&P  Medical Decision Making 76 year old male with recent history of ESBL urinary tract infection.  Patient seen at [place] office earlier this week and ID today.  Urine reported to have greater than 20 red blood cells and greater than 20 white blood cells and be Lipka site esterase positive.  No culture report is back at this time.  Given his previous infection, infectious disease sent him to ED for a evaluation, IV antibiotics, and admission Patient with some mild volume depletion with creatinine increased from 0.98-1.25. Discussed care with infectious disease. Plan admission for IV hydration and meropenem Pharmacy consult for meropenem Discussed with Dr. Loleta Books who will see for admission   Amount and/or Complexity of Data Reviewed Labs: ordered. Decision-making details documented in ED Course. Radiology: ordered and independent interpretation performed. Decision-making details documented in ED Course.           Final Clinical Impression(s) / ED Diagnoses Final diagnoses:  Urinary tract infection with hematuria, site unspecified  Dehydration    Rx / DC Orders ED Discharge Orders     None         Pattricia Boss, MD 04/20/22 [phone]

## 2022-04-20 NOTE — ED Notes (Signed)
Pt stated that he has a broken back and that it hurts to turn over so I was unable to do his rectal temp at this time

## 2022-04-20 NOTE — Progress Notes (Signed)
A consult was received from an ED physician for meropenem per pharmacy dosing.  The patient's profile has been reviewed for ht/wt/allergies/indication/available labs.    A one time order has been placed for meropenem 1 g IV once.    Further antibiotics/pharmacy consults should be ordered by admitting physician if indicated.                       Thank you, Lenis Noon, PharmD 04/20/2022  3:53 PM

## 2022-04-20 NOTE — ED Notes (Signed)
RN called the floor to inquire about receiving RN.

## 2022-04-20 NOTE — ED Notes (Signed)
Patient made aware that a urine sample is needed.   

## 2022-04-20 NOTE — Assessment & Plan Note (Signed)
Patient fell on the stairs six weeks ago and sustained a vertebral fracture (anterior and middle columns of the L1 vertebra with 4 mm bony retropulsion and nondisplaced fracture of the T12 spinous process and mildly displaced horizontally oriented fracture of the sacrum at the S3) This was evaluated by Dr. Ronnald Ramp who recommended conservative management only - PT eval - LSO brace when out of bed - Avoid opiates (patient request)

## 2022-04-20 NOTE — Patient Instructions (Signed)
Please go to [facility] Emergency Room.

## 2022-04-20 NOTE — Assessment & Plan Note (Addendum)
Long discussion about possible contributors today - dehydration, systemic infection, medication s/e.  He has a history of difficult to treat urinary organisms causing UTI in the past. I most suspect dehydration mediated given he as only had 6 oz (maybe) of fluids in the last 16 hours but the sweating / chills argue that he could have bacterial infection contributing.   He has not had any improvement on Macrobid BID and his family feels as if he has really declined the last week again. This has been cyclic since March where every 6 -8 weeks he requires inpatient care. Would recommend ER evaluation to address more urgently his condition so we can determine next steps. While he is hemodynamically stable today, I don't have an ability to do any urgent blood work for him and expressed my concern that it will only delay care. His family understood and agreed. [name] eventually did agree that he could probably do a few days in the hospital.   Needs creatinine as well given poor oral intake and round the clock NSAIDS for back pain protocol. AKI is reasonable consideration here as well.   Alternatively may be progression of Parkinson's diagnosis. He has had several admissions and fall resulting in back fracture as well --- all of these can contribute to not "bouncing back" from hospital stays recently as well.

## 2022-04-20 NOTE — Assessment & Plan Note (Signed)
-   Continue home Sinemet, entacapone

## 2022-04-20 NOTE — Assessment & Plan Note (Signed)
Glucose mostly controlled - Continue glargine - Hold metformin - Contineu SS corrections

## 2022-04-20 NOTE — ED Triage Notes (Signed)
Pt was sent here for IV antibiotics for UTI. Pt has hx of antibiotic-resistant UTIs. Wife reports recent low grade fever, fatigue, and mild disorientation. Pt also has back fractures and endorses back pain from that.

## 2022-04-21 DIAGNOSIS — N179 Acute kidney failure, unspecified: Secondary | ICD-10-CM | POA: Diagnosis not present

## 2022-04-21 DIAGNOSIS — E860 Dehydration: Secondary | ICD-10-CM | POA: Diagnosis not present

## 2022-04-21 DIAGNOSIS — R8271 Bacteriuria: Secondary | ICD-10-CM

## 2022-04-21 DIAGNOSIS — R627 Adult failure to thrive: Secondary | ICD-10-CM

## 2022-04-21 DIAGNOSIS — G20 Parkinson's disease: Secondary | ICD-10-CM

## 2022-04-21 DIAGNOSIS — R6251 Failure to thrive (child): Secondary | ICD-10-CM

## 2022-04-21 LAB — GLUCOSE, CAPILLARY
Glucose-Capillary: 147 mg/dL — ABNORMAL HIGH (ref 70–99)
Glucose-Capillary: 150 mg/dL — ABNORMAL HIGH (ref 70–99)
Glucose-Capillary: 201 mg/dL — ABNORMAL HIGH (ref 70–99)
Glucose-Capillary: 185 mg/dL — ABNORMAL HIGH (ref 70–99)

## 2022-04-21 LAB — CBC
HCT: 41.8 % (ref 39.0–52.0)
Hemoglobin: 13.8 g/dL (ref 13.0–17.0)
MCH: 30 pg (ref 26.0–34.0)
MCHC: 33 g/dL (ref 30.0–36.0)
MCV: 90.9 fL (ref 80.0–100.0)
Platelets: 311 10*3/uL (ref 150–400)
RBC: 4.6 MIL/uL (ref 4.22–5.81)
RDW: 14.3 % (ref 11.5–15.5)
WBC: 7.6 10*3/uL (ref 4.0–10.5)
nRBC: 0 % (ref 0.0–0.2)

## 2022-04-21 LAB — BASIC METABOLIC PANEL
Anion gap: 7 (ref 5–15)
BUN: 21 mg/dL (ref 8–23)
CO2: 26 mmol/L (ref 22–32)
Calcium: 8.9 mg/dL (ref 8.9–10.3)
Chloride: 105 mmol/L (ref 98–111)
Creatinine, Ser: 1.13 mg/dL (ref 0.61–1.24)
GFR, Estimated: >60 mL/min (ref >60)
Glucose, Bld: 131 mg/dL — ABNORMAL HIGH (ref 70–99)
Potassium: 3.9 mmol/L (ref 3.5–5.1)
Sodium: 138 mmol/L (ref 135–145)

## 2022-04-21 MED ORDER — SODIUM CHLORIDE 0.9 % IV BOLUS
500.0000 mL | Freq: Once | INTRAVENOUS | Status: AC
  Administered 2022-04-21: 500 mL via INTRAVENOUS

## 2022-04-21 NOTE — Plan of Care (Signed)
  Problem: Education: Goal: Knowledge of General Education information will improve Description Including pain rating scale, medication(s)/side effects and non-pharmacologic comfort measures Outcome: Progressing   Problem: Health Behavior/Discharge Planning: Goal: Ability to manage health-related needs will improve Outcome: Progressing   

## 2022-04-21 NOTE — Progress Notes (Signed)
Progress Note   Patient: [name] HGD:[id number] DOB: Aug 25, 1946 DOA: 04/20/2022     1 DOS: the patient was seen and examined on 04/21/2022 at 9:05AM      Brief hospital course: Mr. Hoglund is a 76 y.o. 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  In the ER, the patient appeared benign.  BUN/creatinine ratio elevated.  EDP ordered meropenem and consulted for admission.     Assessment and Plan: * Malaise, possible ESBL Klebsiella UTI The patient has had 2 episodes in the past 6 months in which she had similar symptoms (fatigue, decreased energy, malaise, and falls) which reports diagnosis ESBL Klebsiella UTI treated with antibiotics until he felt like he was back to his baseline.  Patient expresses that he believes this started about a year ago when he had multiple urinary catheters placed, he suspects that he had drug-resistant Klebsiella introduced at that time and that "we just haven't gotten rid of it yet".  To correct my documentation in the H&P, BP colectomy infectious disease-recommended antibiotics.  My initial communication with Dr. Elba Barman, he expressed it was his understanding this was the plan as well, but in discussion with Janene Madeira, it appeared that there was not a consensus about treating his current symptoms as UTI.  The patient has no localizing symptoms of UTI, no fever and no leukocytosis. - Cntineu IV fluids - Follow urine culture - Continue Meropenem for now and consult ID to follow  Closed lumbar vertebral fracture (HCC) Patient fell on the stairs six weeks ago and sustained a vertebral fracture (anterior and middle columns of the L1 vertebra with 4 mm bony retropulsion and nondisplaced fracture of the T12 spinous process and mildly displaced horizontally oriented fracture of the sacrum at the S3) This was evaluated by Dr. 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  HTN (hypertension) BP slightly high - Continue lisinopril and lasix  Diabetes (HCC) Glucose mostly controlled - Continue glargine - Hold metformin - Contineu SS corrections   Parkinson's disease ([place]) - Continue home Sinemet, entacapone          Subjective: No new symptoms.  No vomiting, no abdominal pain, no fever.     Physical Exam: Vitals:   04/20/22 2132 04/20/22 2138 04/20/22 2357 04/21/22 0539  BP:  (!) 144/59 (!) 155/71 (!) 158/94  Pulse:  73 69 88  Resp:  '16 17 17  '$ Temp:  97.8 F (36.6 C) 98.3 F (36.8 C) 98 F (36.7 C)  TempSrc:  Oral Oral Oral  SpO2:  94% 96% 93%  Weight: 86.1 kg     Height: 6' (1.829 m)      Older adult male, lying in bed, interactive, no acute distress RRR, no murmurs, JVP normal, no shin edema Respiratory rate normal, lungs clear without rales or wheezes Abdomen soft without tenderness palpation or guarding, no ascites or distention Attention normal affect Blunted, judgment and insight appear normal, general bradykinesia, unchanged from yesterday    Data Reviewed: Basic metabolic panel reviewed, normal Complete blood count reviewed, normal  Family Communication: Daughter at the bedside    Disposition: Status is: Inpatient The patient was admitted for 1 day or may not be an ESBL Klebsiella UTI.  He is  being treated with empiric meropenem and ID are consulted        Author: Edwin Dada, MD 04/21/2022 9:31 AM  For on call review [email or web address].

## 2022-04-21 NOTE — Evaluation (Signed)
Physical Therapy Evaluation Patient Details [name] MRN: [id number] DOB: 01-Jan-1946 Today's Date: 04/21/2022  History of Present Illness  76 yo male admitted with UTI, weakness, falls. Hx of Parkinson's, chronic bil LE edema, syncope, TKA, recurrent UTI, ESBL, hernia repair, DM,recent fall~ 6 wks ago with T12 spinous process fx, L1 ant-midddle colum fx and sacral fx  Clinical Impression  Pt admitted with above diagnosis.  Pt will benefit from PT in acute setting as well as continued HHPT at d/c. See below for mobility and details. Have asked wife to bring pt's LSO back to hospital  Pt currently with functional limitations due to the deficits listed below (see PT Problem List). Pt will benefit from skilled PT to increase their independence and safety with mobility to allow discharge to the venue listed below.          Recommendations for follow up therapy are one component of a multi-disciplinary discharge planning process, led by the attending physician.  Recommendations may be updated based on patient status, additional functional criteria and insurance authorization.  Follow Up Recommendations Home health PT      Assistance Recommended at Discharge Frequent or constant Supervision/Assistance  Patient can return home with the following  A little help with walking and/or transfers;Assistance with cooking/housework;Assist for transportation;Help with stairs or ramp for entrance;A little help with bathing/dressing/bathroom    Equipment Recommendations None recommended by PT  Recommendations for Other Services       Functional Status Assessment Patient has had a recent decline in their functional status and demonstrates the ability to make significant improvements in function in a reasonable and predictable amount of time.     Precautions / Restrictions Precautions Precautions: Fall Precaution Comments: reviewed log roll technique and back precautions Required Braces or  Orthoses: Spinal Brace Spinal Brace: Lumbar corset;Applied in sitting position;Other (comment) Spinal Brace Comments: on when OOB (from injury ~ 6 wks ago). pt family took brace home on advice from ED staff, asked wife to bring it back. pt has been up in room/to bathroom without braceper family Restrictions Weight Bearing Restrictions: No      Mobility  Bed Mobility Overal bed mobility: Needs Assistance Bed Mobility: Rolling, Sidelying to Sit Rolling: Min assist Sidelying to sit: Min assist       General bed mobility comments: multi-modal cues for back precautions, log roll technique; (multi-modal cues for log roll)    Transfers Overall transfer level: Needs assistance Equipment used: Rolling walker (2 wheels) Transfers: Sit to/from Stand Sit to Stand: Min guard           General transfer comment: cues for hand placement    Ambulation/Gait Ambulation/Gait assistance: Min guard Gait Distance (Feet): 160 Feet Assistive device: Rolling walker (2 wheels) Gait Pattern/deviations: Step-through pattern, Decreased stride length, Trunk flexed       General Gait Details: multi-modal cues for posture, RW position and stride length  Stairs            Wheelchair Mobility    Modified Rankin (Stroke Patients Only)       Balance Overall balance assessment: Needs assistance, History of Falls Sitting-balance support: Feet supported, No upper extremity supported Sitting balance-Leahy Scale: Fair     Standing balance support: Single extremity supported, No upper extremity supported Standing balance-Leahy Scale: Fair                               Pertinent Vitals/Pain Pain  Assessment Pain Assessment: Faces Faces Pain Scale: Hurts a little bit Pain Location: back Pain Descriptors / Indicators: Sore Pain Intervention(s): Limited activity within patient's tolerance, Monitored during session, Premedicated before session, Repositioned, Ice applied     Home Living Family/patient expects to be discharged to:: Private residence Living Arrangements: Spouse/significant other Available Help at Discharge: Available PRN/intermittently;Family Type of Home: House Home Access: Stairs to enter   [employer] of Steps: 5 steps   Home Layout: Able to live on main level with bedroom/bathroom;Laundry or work area in [place]: Kasandra Knudsen - single point;BSC/3in1;Shower seat - built in;Shower Land (2 wheels) Additional Comments: pt and wife own [facility]    Prior Function Prior Level of Function : Independent/Modified Independent             Mobility Comments: using RW since fall 6 wks ago ADLs Comments: still working     Journalist, newspaper   Dominant Hand: Right    Extremity/Trunk Assessment   Upper Extremity Assessment Upper Extremity Assessment: Overall WFL for tasks assessed    Lower Extremity Assessment Lower Extremity Assessment: Overall WFL for tasks assessed    Cervical / Trunk Assessment Cervical / Trunk Assessment: Kyphotic  Communication   Communication: No difficulties  Cognition Arousal/Alertness: Awake/alert Behavior During Therapy: WFL for tasks assessed/performed Overall Cognitive Status: Within Functional Limits for tasks assessed                                          General Comments      Exercises     Assessment/Plan    PT Assessment Patient needs continued PT services  PT Problem List Decreased strength;Decreased mobility;Decreased activity tolerance;Decreased balance;Decreased knowledge of use of DME;Pain       PT Treatment Interventions DME instruction;Therapeutic exercise;Gait training;Functional mobility training;Therapeutic activities;Patient/family education;Stair training    PT Goals (Current goals can be found in the Care Plan section)  Acute Rehab PT Goals PT Goal Formulation: With patient Time For Goal  Achievement: 05/05/22 Potential to Achieve Goals: Good    Frequency Min 3X/week     Co-evaluation               AM-PAC PT "6 Clicks" Mobility  Outcome Measure Help needed turning from your back to your side while in a flat bed without using bedrails?: A Little Help needed moving from lying on your back to sitting on the side of a flat bed without using bedrails?: A Little Help needed moving to and from a bed to a chair (including a wheelchair)?: A Little Help needed standing up from a chair using your arms (e.g., wheelchair or bedside chair)?: A Little Help needed to walk in hospital room?: A Little Help needed climbing 3-5 steps with a railing? : A Lot 6 Click Score: 17    End of Session Equipment Utilized During Treatment: Gait belt Activity Tolerance: Patient tolerated treatment well Patient left: in chair;with call bell/phone within reach;with chair alarm set   PT Visit Diagnosis: Other abnormalities of gait and mobility (R26.89);Difficulty in walking, not elsewhere classified (R26.2)    Time: 2831-5176 PT Time Calculation (min) (ACUTE ONLY): 23 min   Charges:   PT Evaluation $PT Eval Low Complexity: 1 Low PT Treatments $Gait Training: 8-22 mins        Baxter Flattery, PT  Acute Rehab Dept [facility]) [phone]  [facility] Weekend Pager (Saturday/Sunday only)  [phone]  04/21/2022   [facility] 04/21/2022, 2:46 PM

## 2022-04-21 NOTE — Progress Notes (Signed)
[facility] inpatient follow up note    Subjective: Patient admitted and labs/workup consistent with severe dehydration with aki No sepsis He improved significantly within 24 hours with supportive care in terms of aki.  Subjectively he said he has been feeling fine Again no uti sx He does agree now his intake could be better  We all agree that with his recent fall/vertebral bodies stress fracture and being bed bound has set him back physically, especially with his parkinson quite a bit  For someone with relatively intact somatic/viscer sensory function and cognition, the chronicity/stepwise persistence decline like this doesn't jive with that of infection   Objectives: Afebrile; hds Alert/oriented; conversant Heent: normocephalic; flat facies; slight drooling while awake as I came to evaluate Pulm: normal respiratory effort Abd s/nt Ext no edema Neuro: no rigidity or resting tremor; generalized weakness with symmetric strength that is 4-5/5 on all extremities  Labs: Lab Results  Component Value Date   WBC 7.6 04/21/2022   HGB 13.8 04/21/2022   HCT 41.8 04/21/2022   MCV 90.9 04/21/2022   PLT 311 84/13/2440   Last metabolic panel Lab Results  Component Value Date   GLUCOSE 131 (H) 04/21/2022   NA 138 04/21/2022   K 3.9 04/21/2022   CL 105 04/21/2022   CO2 26 04/21/2022   BUN 21 04/21/2022   CREATININE 1.13 04/21/2022   GFRNONAA >60 04/21/2022   CALCIUM 8.9 04/21/2022   PROT 6.1 (L) 03/06/2022   ALBUMIN 2.9 (L) 03/06/2022   BILITOT 0.9 03/06/2022   ALKPHOS 81 03/06/2022   AST 18 03/06/2022   ALT <5 03/06/2022   ANIONGAP 7 04/21/2022     A/p Failure to thrive Aki Dehydration Assymptomatic bacteriuria 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 Advance parkinson's disease   Discuss risk of low threshold use of abx Reenforce criteria for uti being  mainly the story not labs/urine color-smell Encourage close ID follow up if concern  Encourage focus on strength  conditioning/pt-ot and nutrition/fluid intake  -stop meropenem -can resume prophylaxis macrobid daily on discharge -once hydrated and have a good pt/ot game plan and no further concern from primary team standpoint could discharge -I have phone/video visit appointment for him setup for Wednesday 7/12 @ 330pm -id will sign off and see in clinic -discussed with primary team    I spent 60 minute reviewing data/chart, and coordinating care and >50% direct face to face time providing counseling/discussing diagnostics/treatment plan with patient  Most of the time (50 minutes) spent with counseling patient/his wife        Jabier Mutton, [place] [facility] [place] [phone]  pager   [phone] cell 04/21/2022, 3:21 PM

## 2022-04-21 NOTE — Plan of Care (Signed)
  Problem: Education: Goal: Knowledge of General Education information will improve Description: Including pain rating scale, medication(s)/side effects and non-pharmacologic comfort measures Outcome: Progressing   Problem: Clinical Measurements: Goal: Cardiovascular complication will be avoided Outcome: Progressing   Problem: Activity: Goal: Risk for activity intolerance will decrease Outcome: Progressing   Problem: Nutrition: Goal: Adequate nutrition will be maintained Outcome: Progressing   Problem: Elimination: Goal: Will not experience complications related to urinary retention Outcome: Progressing   Problem: Pain Managment: Goal: General experience of comfort will improve Outcome: Progressing   Problem: Safety: Goal: Ability to remain free from injury will improve Outcome: Progressing

## 2022-04-22 LAB — GLUCOSE, CAPILLARY
Glucose-Capillary: 211 mg/dL — ABNORMAL HIGH (ref 70–99)
Glucose-Capillary: 302 mg/dL — ABNORMAL HIGH (ref 70–99)

## 2022-04-22 NOTE — Discharge Summary (Addendum)
Physician Discharge Summary   Patient: [name] MRN: [id number] DOB: March 24, 1946  Admit date:     04/20/2022  Discharge date: 04/22/22  Discharge Physician: [name]   PCP: Eric Huddle, MD     Recommendations at discharge:  Follow up with Infectious Disease Dr. Gale Le as directed Follow up with PCP Dr. Inda Le as needed     Discharge Diagnoses: Principal Problem:   Dehydration Active Problems:   Bacteriuria, asymptomatic   ESBL Klebsiella UTI doubted   Chronic retention of urine   Parkinson disease ([place])   Diabetes ([place])   HTN (hypertension)   Closed lumbar vertebral fracture [facility])      Hospital Course: [name] is a 76 y.o. 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  In the ER, the patient appeared benign.  BUN/creatinine ratio elevated.  EDP ordered meropenem and consulted for admission.      * Malaise, due to dehydration in setting of Parkinson's disease UTI doubted The patient has had an unfortunate course the last year, beginning with urinary retention requiring a foley catheter, by last fall first having ESBL Klebsiella growing in the urine, and now in the last 6 months having had two episodes of ESBL urinary tract infection.  The patient was admitted and started on meropenem by the emergency physician.    In the absence of localizing symptoms of UTI, fever or leukocytosis, the diagnosis of UTI was questioned.  Instead, he had elevated BUN to creatinine ratio and appeared volume depleted on exam.  AKI ruled out, but IV fluids were given, ID were consulted and meropenem was stopped.  The patient was evaluated by physical and occupational therapy and arrangements for [facility] were made.    ID recommended close follow up in their office.              The [facility] Controlled Substances Registry was reviewed for this patient prior to  discharge.   Consultants: Infectious Disease   Disposition: Home Health   DISCHARGE MEDICATION: Allergies as of 04/22/2022       Reactions   Oxycodone Itching   This makes him itch   Pseudoephedrine Other (See Comments)   Sudafed [pseudoephedrine Hcl]    Problems urinating    Tramadol Hcl Rash        Medication List     STOP taking these medications    fosfomycin 3 g Pack Commonly known as: MONUROL       TAKE these medications    acetaminophen 500 MG tablet Commonly known as: TYLENOL Take 1,000 mg by mouth every 6 (six) hours as needed for fever.   carbidopa-levodopa 25-100 MG tablet Commonly known as: SINEMET IR Take 3 tablets by mouth 4 (four) times daily. What changed:  when to take this additional instructions   cholecalciferol 1000 units tablet Commonly known as: VITAMIN D Take 2,000 Units by mouth daily.   clotrimazole 1 % cream Commonly known as: Lotrimin AF Apply 1 application. topically 2 (two) times daily.   entacapone 200 MG tablet Commonly known as: COMTAN TAKE 1 TABLET BY MOUTH 3 TIMES DAILY (WITH FIRST THREE DOSAGES OF CARBIDOPA/LEVODOPA). What changed: See the new instructions.   furosemide 20 MG tablet Commonly known as: LASIX Take 2 tablets (40 mg total) by mouth daily. What changed: how much to take   Global Ease Inject Pen Needles 31G X 5 MM Misc Generic  drug: Insulin Pen Needle Inject into the skin.   hydrocortisone cream 0.5 % Apply 1 application. topically 2 (two) times daily.   Lantus SoloStar 100 UNIT/ML Solostar Pen Generic drug: insulin glargine Inject 15-20 Units into the skin at bedtime.   lisinopril 5 MG tablet Commonly known as: ZESTRIL Take 5 mg by mouth daily.   metFORMIN 500 MG tablet Commonly known as: GLUCOPHAGE Take 500 mg by mouth 2 (two) times daily with a meal.   methenamine 1 g tablet Commonly known as: MANDELAMINE Take 1 tablet (1,000 mg total) by mouth 2 (two) times daily. Take with any vitamin  c, twice a day for uti prevention   naproxen sodium 220 MG tablet Commonly known as: ALEVE Take 440 mg by mouth daily as needed (pain).   nitrofurantoin (macrocrystal-monohydrate) 100 MG capsule Commonly known as: Macrobid Take 1 capsule (100 mg total) by mouth daily. What changed: when to take this   omeprazole 20 MG capsule Commonly known as: PRILOSEC Take 20 mg by mouth daily as needed (acid reflux).   potassium chloride SA 20 MEQ tablet Commonly known as: KLOR-CON M Take 20 mEq by mouth daily.   rOPINIRole 3 MG tablet Commonly known as: REQUIP Take 1 tablet in the morning, Take  1 in the afternoon, Take a  half tablet in the evening What changed:  how much to take how to take this when to take this   tamsulosin 0.4 MG Caps capsule Commonly known as: FLOMAX Take 0.4 mg by mouth in the morning and at bedtime.   triamcinolone 55 MCG/ACT Aero nasal inhaler Commonly known as: NASACORT Place 2 sprays into the nose daily as needed (SOB).   Tums 500 MG chewable tablet Generic drug: calcium carbonate Chew 2 tablets by mouth daily as needed for indigestion or heartburn.   vitamin B-12 1000 MCG tablet Commonly known as: CYANOCOBALAMIN Take 1,000 mcg by mouth daily.   vitamin C 100 MG tablet Take 100 mg by mouth in the morning and at bedtime.         Discharge Instructions     Discharge instructions   Complete by: As directed    Drink 1.8-2L fluid per day Follow up with Dr. Gale Le as directed Resume all your home medicines without change Consider "CBT for sleep", which is cognitive behavioral therapy, also reach out to the local Parkinson's disease support group for advice   Increase activity slowly   Complete by: As directed        Discharge Exam: Filed Weights   04/20/22 2132  Weight: 86.1 kg    General: Pt is alert, awake, not in acute distress, sitting up in recliner Cardiovascular: RRR, nl S1-S2, no murmurs appreciated.   No LE edema.   Respiratory:  Normal respiratory rate and rhythm.  CTAB without rales or wheezes. Neuro/Psych: Strength symmetric in upper and lower extremities.  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  Bradykinesia noted.   Condition at discharge: stable  The results of significant diagnostics from this hospitalization (including imaging, microbiology, ancillary and laboratory) are listed below for reference.   Imaging Studies: DG Chest Port 1 View  Result Date: 04/20/2022 CLINICAL DATA:  Weakness EXAM: PORTABLE CHEST 1 VIEW COMPARISON:  03/01/2022 FINDINGS: Mild cardiomegaly. Both lungs are clear. The visualized skeletal structures are unremarkable. IMPRESSION: Mild cardiomegaly without acute abnormality of the lungs in AP portable projection. Electronically Signed   By: Delanna Ahmadi M.D.   On: 04/20/2022 16:29  MR ABDOMEN WWO CONTRAST  Result Date: 04/16/2022 CLINICAL DATA:  Right renal neoplasm follow-up EXAM: MRI ABDOMEN WITHOUT AND WITH CONTRAST TECHNIQUE: Multiplanar multisequence MR imaging of the abdomen was performed both before and after the administration of intravenous contrast. CONTRAST:  72m MULTIHANCE GADOBENATE DIMEGLUMINE 529 MG/ML IV SOLN COMPARISON:  MRI abdomen 10/22/2021 FINDINGS: Study is limited due to motion. Lower chest: No acute findings. Hepatobiliary: Liver is normal in size and contour with no suspicious mass identified. Tiny hepatic cyst anteriorly. 1.8 cm gallstone. No gallbladder wall thickening or surrounding edema. No biliary ductal dilatation. Pancreas: A few tiny cystic foci are visualized in the pancreas measuring up to 3 mm in the tail and a cyst or focal ductal dilatation in the head of the pancreas measuring up to 6.5 mm in diameter. No inflammatory changes identified. Spleen:  Within normal limits in size and appearance. Adrenals/Urinary Tract: Adrenal glands appear normal. A few small renal cysts are again seen bilaterally measuring up to  1.3 cm in the posterior left kidney. Stable size of a complex cystic mass in the lateral right kidney measuring 1.1 cm with enhancing internal septations. No hydronephrosis. Stomach/Bowel: Visualized portions within the abdomen are unremarkable. Vascular/Lymphatic: No pathologically enlarged lymph nodes identified. No abdominal aortic aneurysm demonstrated. Other:  No ascites. Musculoskeletal: No suspicious bone lesions identified. Previous fracture of L1 partially visualized. IMPRESSION: 1. 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 2. Small renal cysts. 3. Tiny cystic lesions in the pancreas as described which can be followed along with the renal mass. 4. Cholelithiasis. 5. Previous L1 fracture. Electronically Signed   By: DOfilia NeasM.D.   On: 04/16/2022 15:13   CT LUMBAR SPINE WO CONTRAST  Result Date: 04/03/2022 CLINICAL DATA:  Provided history: Compression fracture of lumbar vertebra, unspecified lumbar vertebral level, initial encounter. Closed fracture of sacrum and coccyx, initial encounter. Additional history provided by scanning technologist: Patient reports fall 11 days ago, mid to lower back pain. EXAM: CT LUMBAR SPINE WITHOUT CONTRAST TECHNIQUE: Multidetector CT imaging of the lumbar spine was performed without intravenous contrast administration. Multiplanar CT image reconstructions were also generated. RADIATION DOSE REDUCTION: This exam was performed according to the departmental dose-optimization program which includes automated exposure control, adjustment of the mA and/or kV according to patient size and/or use of iterative reconstruction technique. COMPARISON:  Lumbar spine MRI 01/29/2013. CT abdomen/pelvis 09/19/2021. FINDINGS: Segmentation: 5 lumbar vertebrae. The caudal most well-formed intervertebral disc space is designated L5-S1. Alignment: 4 mm bony retropulsion at the level of the L1 superior endplate. 5 mm L4-L5 grade 1  anterolisthesis. 3 mm L5-S1 grade 1 retrolisthesis. Vertebrae: Acute fracture of the anterior and middle columns of the L1 vertebra with 4 mm bony retropulsion at the level of the L1 superior endplate. There is a mildly displaced fracture fragment along the right anterolateral aspect of the vertebral body. Acute, nondisplaced fracture of the T12 spinous process. Acute, mildly displaced horizontally oriented fracture of the sacrum at the S3 level. Small Schmorl node within the T12 superior endplate. Paraspinal and other soft tissues: Cholecystolithiasis. Small hiatal hernia. Aortic atherosclerosis. No paraspinal mass or collection. Disc levels: Multilevel disc space narrowing, greatest at L5-S1 (advanced at this level). T12-L1: 4 mm bony retropulsion at the level of the L1 superior endplate. Mild effacement of the ventral thecal sac. No appreciable significant foraminal stenosis. L1-L2: Disc bulge. Mild facet arthrosis. No appreciable significant spinal canal stenosis. Mild right neural foraminal narrowing. L2-L3: Disc  bulge. Mild facet arthrosis and ligamentum flavum hypertrophy. No appreciable significant spinal canal stenosis. Mild left neural foraminal narrowing. L3-L4: Disc bulge. Mild facet arthrosis and ligamentum flavum hypertrophy. Mild central canal stenosis. Bilateral neural foraminal narrowing (mild right, moderate left). L4-L5: 5 mm grade 1 anterolisthesis. Disc uncovering with disc bulge. Advanced facet arthrosis with ligamentum flavum hypertrophy. Apparent severe bilateral subarticular and central canal stenosis. Severe bilateral neural foraminal narrowing. L5-S1: Disc bulge with endplate spurring/osteophytic ridging. Mild facet arthrosis (greater on the left). No appreciable significant spinal canal stenosis. Bilateral neural foraminal narrowing (severe right, moderate left). The distal sacrum and coccyx are excluded from the field of view. Impressions 2, 3 and 4 will be called to the ordering  clinician or representative by the Radiologist Assistant, and communication documented in the PACS or [employer]. IMPRESSION: 1. Please note, the distal sacrum and coccyx are excluded from the field of view. 2. Acute fracture of the anterior and middle columns of the L1 vertebra, as described. This fracture may be unstable. 4 mm bony retropulsion at the level of the L1 superior endplate, mildly narrowing the spinal canal. 3. Nondisplaced acute fracture of the T12 spinous process. 4. Mildly displaced, horizontally-oriented fracture through the sacrum at the S3 level. 5. Lumbar spondylosis, as outlined. Most notably at L4-L5, there is multifactorial apparent severe spinal canal stenosis, and severe bilateral neural foraminal narrowing. Severe neural foraminal narrowing also present on the right at L5-S1. 6. 5 mm facet-mediated grade 1 anterolisthesis at L4-L5. 7. Cholecystolithiasis. 8. Small hiatal hernia. 9. Aortic Atherosclerosis (ICD10-I70.0). Electronically Signed   By: Kellie Simmering D.O.   On: 04/03/2022 11:40   DG Ribs Unilateral Right  Result Date: 03/26/2022 CLINICAL DATA:  76 year old male presents for evaluation of RIGHT-sided rib pain. EXAM: RIGHT RIBS - 2 VIEW COMPARISON:  March 26, 2022. FINDINGS: No displaced rib fracture is noted along posteroinferior RIGHT-sided ribs. Oblique view mildly limited by motion. Limited chest x-ray is acquired and reported separately. Upper ribs are not evaluated. IMPRESSION: No displaced rib fractures are noted on this mildly limited exam. Chest x-ray is acquired and reported separately. Electronically Signed   By: Zetta Bills M.D.   On: 03/26/2022 13:32   DG Chest 2 View  Result Date: 03/26/2022 CLINICAL DATA:  Recurrent falls, posterior right lower rib pain. Initial encounter. EXAM: CHEST - 2 VIEW COMPARISON:  03/01/2022. FINDINGS: Trachea is midline. Heart is enlarged, stable. Thoracic aorta is calcified. Lungs are clear. Trace right pleural fluid.  IMPRESSION: Trace right pleural fluid. Dedicated right rib series dictated separately. Electronically Signed   By: Lorin Picket M.D.   On: 03/26/2022 13:30    Microbiology: Results for orders placed or performed during the hospital encounter of 04/20/22  Blood culture (routine x 2)     Status: None (Preliminary result)   Collection Time: 04/20/22  1:58 PM   Specimen: BLOOD  Result Value Ref Range Status   Specimen Description   Final    BLOOD SITE NOT SPECIFIED Performed at [facility] [address] [place], [place] [address]    Special Requests   Final    BOTTLES DRAWN AEROBIC AND ANAEROBIC Blood Culture adequate volume Performed at [facility] [address], [place], [place] [address]    Culture   Final    NO GROWTH 2 DAYS Performed at [facility] [address] [place], [place] [address]    Report Status PENDING  Incomplete  Blood culture (routine x  2)     Status: None (Preliminary result)   Collection Time: 04/20/22  1:58 PM   Specimen: BLOOD  Result Value Ref Range Status   Specimen Description   Final    BLOOD SITE NOT SPECIFIED Performed at [facility] [address] [place], [place] [address]    Special Requests   Final    BOTTLES DRAWN AEROBIC AND ANAEROBIC Blood Culture adequate volume Performed at [facility] [address], [place], [place] [address]    Culture   Final    NO GROWTH 2 DAYS Performed at [facility] [address] [place], [place] [address]    Report Status PENDING  Incomplete  Urine Culture     Status: Abnormal (Preliminary result)   Collection Time: 04/20/22  7:19 PM   Specimen: Urine, Catheterized  Result Value Ref Range Status   Specimen Description   Final    URINE, CATHETERIZED Performed at [facility] [address], [place], [place] [address]    Special Requests   Final    NONE Performed at [facility], [place] [address], [place], [place] [address]    Culture >=100,000 COLONIES/mL KLEBSIELLA PNEUMONIAE (A)  Final   Report Status PENDING  Incomplete    Labs: CBC: Recent Labs  Lab 04/20/22 1358 04/21/22 0338  WBC 9.6 7.6  HGB 13.5 13.8  HCT 41.1 41.8  MCV 90.3 90.9  PLT 298 638   Basic Metabolic Panel: Recent Labs  Lab 04/20/22 1358 04/21/22 0338  NA 135 138  K 3.8 3.9  CL 99 105  CO2 27 26  GLUCOSE 140* 131*  BUN 24* 21  CREATININE 1.25* 1.13  CALCIUM 9.6 8.9   Liver Function Tests: No results for input(s): "AST", "ALT", "ALKPHOS", "BILITOT", "PROT", "ALBUMIN" in the last 168 hours. CBG: Recent Labs  Lab 04/21/22 0730 04/21/22 1110 04/21/22 1706 04/21/22 2110 04/22/22 0726  GLUCAP 147* 150* 201* 185* 211*    Discharge time spent: approximately 35 minutes spent on discharge counseling, evaluation of patient on day of discharge, and coordination of discharge planning with nursing, social work, pharmacy and case management  Signed: Edwin Dada, MD [facility] 04/22/2022

## 2022-04-22 NOTE — Plan of Care (Signed)

## 2022-04-22 NOTE — Evaluation (Signed)
Occupational Therapy Evaluation Patient Details [name] MRN: [id number] DOB: 10-Jan-1946 Today's Date: 04/22/2022   History of Present Illness 76 yo male admitted with UTI, weakness, falls. PMHx of Parkinson's, chronic bil LE edema, syncope, TKA, recurrent UTI, ESBL, hernia repair, DM, recent fall~ 6 wks ago with T12 spinous process fx, L1 ant-midddle colum fx and sacral fx   Clinical Impression   Patient was living at home with wife support for LB dressing/bathing since fall at start of June with LSO implementation.  Patient is eager to transition home on this date. Patient continued to have difficulty with education on AE to prevent bending with LB Dressing tasks during session even with education and multimodal cues. Patient's wife and daughter reported the would be able to support patient at home at current level. Patient would continue to benefit from skilled OT services at this time while admitted and after d/c to address noted deficits in order to improve overall safety and independence in ADLs.       Recommendations for follow up therapy are one component of a multi-disciplinary discharge planning process, led by the attending physician.  Recommendations may be updated based on patient status, additional functional criteria and insurance authorization.   Follow Up Recommendations  Home health OT    Assistance Recommended at Discharge Frequent or constant Supervision/Assistance  Patient can return home with the following A little help with walking and/or transfers;A little help with bathing/dressing/bathroom;Assistance with cooking/housework;Direct supervision/assist for financial management;Assist for transportation;Help with stairs or ramp for entrance;Direct supervision/assist for medications management    Functional Status Assessment  Patient has had a recent decline in their functional status and demonstrates the ability to make significant improvements in function in a  reasonable and predictable amount of time.  Equipment Recommendations  Other (comment) Management consultant)    Recommendations for Other Services       Precautions / Restrictions Precautions Precautions: Fall Precaution Comments: back precautions Required Braces or Orthoses: Spinal Brace Spinal Brace: Lumbar corset;Applied in sitting position;Other (comment) Spinal Brace Comments: on when OOB Restrictions Weight Bearing Restrictions: No      Mobility Bed Mobility               General bed mobility comments: patient was transitioned to recliner in room at end of session.    Transfers                          Balance                                           ADL either performed or assessed with clinical judgement   ADL Overall ADL's : Needs assistance/impaired Eating/Feeding: Sitting;Modified independent   Grooming: Set up;Sitting   Upper Body Bathing: Set up;Sitting   Lower Body Bathing: Moderate assistance;Sitting/lateral leans Lower Body Bathing Details (indicate cue type and reason): patient does not adhere to back precautions even with cues. wife completes LB Dressing/bathing tasks for him at home to avoid breaking precautions. Upper Body Dressing : With caregiver independent assisting;Standing Upper Body Dressing Details (indicate cue type and reason): patients wife is able to complete donning brace MI on this date. Lower Body Dressing: With caregiver independent assisting Lower Body Dressing Details (indicate cue type and reason): patients wife was able to asist with donning pants. patient was educated on using reacher with paitent  still reaching and bending even with cues verbal and tactile to avoid bending. patient did not follow cues to avoid breaking precautions. patient's wife demonstrated ability to complete task. patient and wife were educate don compression stockings with toes exposed trick to reduce friction. patient and wife  verbalized understanding. Toilet Transfer: Agricultural engineer (2 wheels) Toilet Transfer Details (indicate cue type and reason): with cues to keep BUE on walker. Toileting- Water quality scientist and Hygiene: Min guard;Sit to/from stand Toileting - Clothing Manipulation Details (indicate cue type and reason): with increased time. patients wife reported patient is no longer urinary incontinent but was noted to have some leakage on gown transitioning to bathroom on this date.     Functional mobility during ADLs: Min guard;Rolling walker (2 wheels)       Vision Baseline Vision/History: 1 Wears glasses Patient Visual Report: No change from baseline       Perception     Praxis      Pertinent Vitals/Pain Pain Assessment Pain Assessment: Faces Faces Pain Scale: Hurts a little bit Pain Location: back Pain Descriptors / Indicators: Sore Pain Intervention(s): Limited activity within patient's tolerance, Monitored during session, Repositioned     Hand Dominance Right   Extremity/Trunk Assessment Upper Extremity Assessment Upper Extremity Assessment: Overall WFL for tasks assessed   Lower Extremity Assessment Lower Extremity Assessment: Defer to PT evaluation   Cervical / Trunk Assessment Cervical / Trunk Assessment: Kyphotic   Communication Communication Communication: No difficulties   Cognition Arousal/Alertness: Awake/alert Behavior During Therapy: WFL for tasks assessed/performed Overall Cognitive Status: Within Functional Limits for tasks assessed                                 General Comments: patient did not follow cues for donning pants to mainain back precautions. patient was fixated on completing task. patient was noted to have some confusion on timeline of hospitalization. wife was present and able to provide info     General Comments       Exercises     Shoulder Instructions      Home Living Family/patient expects to be discharged to::  Private residence Living Arrangements: Spouse/significant other Available Help at Discharge: Available PRN/intermittently;Family Type of Home: House Home Access: Stairs to enter [employer] of Steps: 5 steps   Home Layout: Able to live on main level with bedroom/bathroom;Laundry or work area in [place]: Kasandra Knudsen - single point;BSC/3in1;Shower seat - built in;Shower seat;Rolling Environmental consultant (2 wheels)   Additional Comments: 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 still bends too much with reacher for pants and compression stockings with wife assist for these things since fall.        OT Problem List: Decreased activity tolerance;Impaired balance (sitting and/or standing);Decreased safety awareness;Decreased knowledge of use of DME or AE;Decreased knowledge of precautions      OT Treatment/Interventions: Self-care/ADL training;Therapeutic exercise;Neuromuscular education;Energy conservation;DME and/or AE instruction;Therapeutic activities;Balance training;Patient/family education    OT Goals(Current goals can be found in the care plan section) Acute Rehab OT Goals Patient Stated Goal: to go home OT Goal Formulation: With patient/family Time For Goal Achievement: 05/06/22  Potential to Achieve Goals: Fair  OT Frequency: Min 2X/week    Co-evaluation              AM-PAC OT "6 Clicks" Daily Activity     Outcome Measure Help from another person eating meals?: None Help from another person taking care of personal grooming?: A Little Help from another person toileting, which includes using toliet, bedpan, or urinal?: A Little Help from another person bathing (including washing, rinsing, drying)?: A Lot Help from another person to put on and taking  off regular upper body clothing?: A Little Help from another person to put on and taking off regular lower body clothing?: A Lot 6 Click Score: 17   End of Session Equipment Utilized During Treatment: Rolling walker (2 wheels);Other (comment) Management consultant) Nurse Communication: Mobility status  Activity Tolerance: Patient tolerated treatment well Patient left: in chair;with call bell/phone within reach;with family/visitor present  OT Visit Diagnosis: Other abnormalities of gait and mobility (R26.89);Muscle weakness (generalized) (M62.81)                Time: 1540-0867 OT Time Calculation (min): 36 min Charges:  OT General Charges $OT Visit: 1 Visit OT Evaluation $OT Eval Low Complexity: 1 Low OT Treatments $Self Care/Home Management : 8-22 mins  Jackelyn Poling OTR/L, MS Acute Rehabilitation Department Office# [phone] Pager# [phone]   Marcellina Millin 04/22/2022, 10:28 AM

## 2022-04-22 NOTE — Progress Notes (Signed)
The patient is alert and oriented and has been seen by his physician. The orders for discharge were written. IV has been removed. Went over discharge instructions with patient and family. He is being discharged via wheelchair with all of his belongings.  

## 2022-04-22 NOTE — Progress Notes (Signed)
Notified Dr. Myrene Buddy, MD to inform about patient's latest CBG of 302. Patient and family think the elevated CBG was from the jelly that went on the patient's bagel from this morning's breakfast. Patient and family stated that blood sugars are well managed at home. Patient and family had no concerns about blood sugars. Dr. Loleta Books, MD had no further concerns about blood sugars.

## 2022-04-23 LAB — URINE CULTURE: Culture: >=100000 — AB

## 2022-04-25 ENCOUNTER — Other Ambulatory Visit: Payer: Self-pay

## 2022-04-25 ENCOUNTER — Encounter: Payer: Self-pay | Admitting: Internal Medicine

## 2022-04-25 ENCOUNTER — Ambulatory Visit (INDEPENDENT_AMBULATORY_CARE_PROVIDER_SITE_OTHER): Payer: Medicare Other | Admitting: Internal Medicine

## 2022-04-25 VITALS — BP 158/81 | HR 68 | Temp 97.7°F | Wt 189.0 lb

## 2022-04-25 DIAGNOSIS — G20 Parkinson's disease: Secondary | ICD-10-CM

## 2022-04-25 DIAGNOSIS — Z8619 Personal history of other infectious and parasitic diseases: Secondary | ICD-10-CM | POA: Diagnosis not present

## 2022-04-25 LAB — CULTURE, BLOOD (ROUTINE X 2)
Culture: NO GROWTH
Culture: NO GROWTH
Special Requests: ADEQUATE
Special Requests: ADEQUATE

## 2022-04-25 NOTE — Progress Notes (Signed)
[facility] for Infectious Disease  Reason for Consult:recurrent uti  Referring Provider: [facility]    Patient Active Problem List   Diagnosis Date Noted   AKI (acute kidney injury) ([place])    Dehydration    Failure to thrive (child)    Bacteriuria, asymptomatic    Failure to thrive in adult 04/20/2022   Malaise, possible ESBL Klebsiella UTI 04/20/2022   Chronic retention of urine 04/20/2022   Closed lumbar vertebral fracture ([place]) 04/20/2022   UTI (urinary tract infection) 03/01/2022   Leukocytosis 01/01/2022   Hyponatremia 01/01/2022   CKD (chronic kidney disease), stage III ([place]) 01/01/2022   HTN (hypertension) 12/31/2021   History of ESBL Klebsiella pneumoniae infection 12/31/2021   Generalized weakness 12/31/2021   Chronic venous insufficiency 08/29/2021   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   Encounter for general adult medical examination with abnormal findings 02/18/2018   Enlarged prostate 02/18/2018   Hearing loss 02/18/2018   Other long term (current) drug therapy 02/18/2018   Type 2 diabetes mellitus with hyperglycemia ([place]) 02/18/2018   HLD (hyperlipidemia)    Nephrolithiasis    Parkinson disease ([place])    Diabetes mellitus with coincident hypertension ([place])    Syncope    Renal calculus    Diabetes ([place])    Allergic rhinitis    BPH (benign prostatic hyperplasia)    Erectile dysfunction    Dysphagia, pharyngeal phase    Edema    Ureteral calculus 02/09/2013   LEG PAIN 03/14/2010      HPI: RUSSELL QUINNEY is a 76 y.o. male parkinsons disease, gerd, renal stone, BPH, dm on insulin, recurrent uti referred by his urologist for same  He has had foley catheter in the past, but doesn't have a foley catheter this visit 02/28/22  I spoke with dr Louis Meckel (urology) and also discussed via secure chat with dr Wells Guiles [name] (his movement disorder neurologist) today 1) patient has recurrent uti -- hx mdro needing iv abx in the  past. Patient's wife dropped off a urine sample a few days prior to this visit of 02/28/22 asking urology to evaluate as patient has become weaker with more falls concerning for a uti 2) his neurologist has been adjusting parkinson's meds but is wondering if it is due to secondary causes making his parkinsonism temporarily worse rather than primary progression of his parkinsonism  She last saw his urologist on 01/03/22 during admission 3/19-24 for esbl uti. He had 4 days progressive malaise/generalized weakness and was admitted to get meropnem --> ertapenem for 2 weeks. On follow up 3/22 he was feeling better.  He is currently on nitrofurantoin for prophylaxis, since 5/16. He is getting ceftriaxone as well at the [facility] at [facility].    I reviewed his previous urine culture on epic (last one I could see was 12/31/21) 12/31/21 kleb pna pan-resistant except imipenem, but only 20k colonies 06/2021 citrobacter freundii (S cipro/bactrim/gentamycin/nitrofurantoin) and kleb pna (S imipenem but resistant to cipro/bactrim)   Care everywhere labs: 01/31/22 ucx >100k colonies/ml of kleb pna esbl (R cipro, bactrim; s ertapenem)   He has had about 3-4 episodes of these increased parkinsonism the last 2 years and all got better with abx treatment. He doesn't take over the counter medication intermittently like benadryl that could worsen urinary retention  He denies other sx of typical uti outside of the weakness/malaise and increased parkinsonism (which is mainly more falls and shuffling gait and unsteady). He hasn't had  a time of these episode where he clinically watch and improve  He mentions over the last 2 years his parkinson also gets gradually worse  Since his last episode 12/31/21 uti, he had 4-5 good week baseline sx and the last several days have had worsening parkinsonism and weakness/malaise.   He is using a fww now which is very bad for him.  He hasn't felt better since  nitrofurantoin and ceftriaxone yet.   He has not had no prostate sling procedure He last had a foley catheter in 12/2021  Normally he hasn't had to straight cath himself    Meds: Sinemet 25-100 3 tablet in morning, and 2 tablet q2hours Entacapone 200 mg tid ropinarole Lantus Acetaminophen Nitrofurantoin 5/16-c Metformin Lisinopril Omeprazole Tamsulosin Furosemide Simvastatin   ------------------------ 03/13/22 id clinic  Since hospital discharge patient hasn't felt better Still uses the walker and feeling sleepy Home health pt/ot just had their first visit -- will come twice a week at least No abdominal pain No fever/chill  Will see dr Hall Busing 6/9th for neurologic evaluation  Developed a new groin rash -- started in hospital  04/19/22 id clinic Televisit phone for concern of another uti  Wife reports patient had fallen early June and had ?back fracture. Was on narcotics but down to tylenol extra strength. Sleeping more. No urgency/frequency but dark urine. Not eating/drinking much Gets around with fww  She brought a urine to pcp yesterday that showed >20 rbc and wbc and many bacteria but no nitrite. And a bactrim rx was called in  On macrobid once a day for uti prevention  We had advised if she is concern about this should address at urgent care/ed given esbl and shouldn't delay sepsis treatment if that is the case but she wanted to discuss via phone with me prior to deciding further   --------------- 04/25/22 id f/u I saw him in hospital. He was given 2 doses meropenem but I stopped as I physically visit and repeat story and he didn't appear to have uti He was discharged and follows up today He appears well Complains of back pain and yellow urine   We discussed game plan for keeping him up with intake -- he drinks unsweeten tea/sugar free diet ginger ale  No weight loss  Discharge pt/ot plan from [facility] is pending neurosurgery recs.   No fever, chill,  urgency/frequency/dysuria Mentating well  Walks in clinic with fww  Review of Systems: ROS All other ros negative      Past Medical History:  Diagnosis Date   Abnormal involuntary movement 02/18/2018   Allergic rhinitis    Arthritis    BPH (benign prostatic hyperplasia)    Diabetes ([place])    Dysphagia, pharyngeal phase    GERD diagnosed on barium swallow. Has small hiatal hernia. Symptomatically somewhat better on omeprazole but not entirely. We'll try b.i.d. therapy   Edema    1+ in both ankles, likely multifactorial including medication such as Requip   Erectile dysfunction    Staxyn 10 mg or Viagra worked well. 3 samples of Cialis 20 mg provided   GERD (gastroesophageal reflux disease)    Hypercholesteremia    Hypertension    Nephrolithiasis    Onychomycosis of toenail    April 27, 2013 - Dr. Inocencio Homes - podiatry, was in [place] - treating with oral Lamisil and topical nail therapy   Parkinson's disease [facility])     followed by Dr[facility] Lezlie Octave at [facility] and [name], M.D. in [place]  Presbycusis    and tinnitus - Dr. Izora Gala - August/2013   Renal calculus    Syncope     Social History   Tobacco Use   Smoking status: Never   Smokeless tobacco: Never  Vaping Use   Vaping Use: Never used  Substance Use Topics   Alcohol use: No   Drug use: No    Family History  Problem Relation Age of Onset   Atrial fibrillation Mother    Breast cancer Mother    Emphysema Father    Heart disease Father    Cancer Brother        African Burkitt    Allergies  Allergen Reactions   Oxycodone Itching    This makes him itch   Pseudoephedrine Other (See Comments)   Sudafed [Pseudoephedrine Hcl]     Problems urinating    Tramadol Hcl Rash    OBJECTIVE: Vitals:   04/25/22 1529 04/25/22 1534  BP:  (!) 158/81  Pulse:  68  Temp:  97.7 F (36.5 C)  TempSrc:  Oral  Weight: 189 lb (85.7 kg) 189 lb (85.7 kg)    Body mass index is 25.63 kg/m.   Physical  Exam Vitals:   04/25/22 1534  BP: (!) 158/81  Pulse: 68  Temp: 97.7 F (36.5 C)   General/constitutional: no distress, pleasant HEENT: Normocephalic, PER, Conj Clear, EOMI, Oropharynx clear Neck supple CV: rrr no mrg Lungs: clear to auscultation, normal respiratory effort Abd: Soft, Nontender Ext: no edema Skin: No Rash Neuro: nonfocal; no rigidity; slight flat facie MSK: wears tlso brace; shuffling gait with fww   Lab: Lab Results  Component Value Date   WBC 7.6 04/21/2022   HGB 13.8 04/21/2022   HCT 41.8 04/21/2022   MCV 90.9 04/21/2022   PLT 311 67/89/3810   Last metabolic panel Lab Results  Component Value Date   GLUCOSE 131 (H) 04/21/2022   NA 138 04/21/2022   K 3.9 04/21/2022   CL 105 04/21/2022   CO2 26 04/21/2022   BUN 21 04/21/2022   CREATININE 1.13 04/21/2022   GFRNONAA >60 04/21/2022   CALCIUM 8.9 04/21/2022   PROT 6.1 (L) 03/06/2022   ALBUMIN 2.9 (L) 03/06/2022   BILITOT 0.9 03/06/2022   ALKPHOS 81 03/06/2022   AST 18 03/06/2022   ALT <5 03/06/2022   ANIONGAP 7 04/21/2022    Microbiology:  Serology:  Imaging:   Assessment/plan: Problem List Items Addressed This Visit       Nervous and Auditory   Parkinson disease ([place]) - Primary     Other   History of ESBL Klebsiella pneumoniae infection     Discuss concept of assymptomatic bacteriuria and uti  His symptoms although atypical could be consistent with episodes of uti worsening parkinsonism. There is no intermittent offending medications that triggers these symptoms  He has consistently grown kleb pna esbl. His current ceftriaxone and nitrofurantoin are not sensitive to this bacteria  We could try fosfamycin now and if doesn't work, will repeat culture and see if carbapenem is needed    His story suggest that over the last couple years his parkinsonism is slowing progressive but clearly these episodes of presumed uti had demonstrated reversible brief duration of worsening  parkinonism   ------------ 5/30 assessment Admitted recently to get carbapenem for presumed uti but no improvement despite cultures x2 without other resistant bacteremia This now appears to be assymptomatic bacteriuria and not uti  Can keep on prophylaxis against uti although at this time not  sure if he has had recurrent uti frequently; definitely at risk for it. Can finish weekly fosfomycin then go on daily nitrofurantoin  For his groin rash which is candida intetrigo, will use hydrocortisone and lotrimin cream bid otc for 2 weeks   Follow up in 4-6 weeks  Follow up with dr Sondra Come as per her discretion    04/19/2022 assessment I was able to speak with patient's wife only I advise that someone should see the patient physically and make assessment  The patient has been having issues with mobility due to parkinson rather. We had trialed carbepenem treatment for what was growing in his urine in 02/2022 without change. At this time I am not sure what the patient's wife reports reflect uti and he should be seen   But if fever, chill, change in mentation I advise them to bring patient to ED  Again discuss urine color/odor/presence of bacteria in urine-wbc/rbc in urine by themselves do not qualify for a uti   My clinic staff will look for availability in clinic today or tomorrow for patient to be seen and see if blood work needed as well  I also advise them that if they are concerned about uti should go to [place] first   At this time can do 7 days of bid nitrofurantoin. I would avoid bactrim at this time for many reasons and also his esbl was resistant to bactrim previously   Addendum:  I verified that I was speaking with the correct person using two identifiers. Due to the COVID-19 Pandemic/his choosing, this service was provided via telemedicine using audio/visual media.   The patient was located at home. The provider was located in the office. The patient did consent to this visit and  is aware of charges through their insurance as well as the limitations of evaluation and management by telemedicine. Other persons participating in this telemedicine service were none. Time spent on visit was greater than 20 minutes on media and in coordination of care  7/12 assessment Doing well postop Reenforce need for intake of fluid and pt/ot and avoiding getting urine testing before talking to ID clinic   F/u as needed    Follow-up: Return if symptoms worsen or fail to improve.     Jabier Mutton, [place] for Infectious Disease [facility] 04/25/2022, 3:49 PM

## 2022-04-29 ENCOUNTER — Emergency Department (HOSPITAL_COMMUNITY): Payer: Medicare Other

## 2022-04-29 ENCOUNTER — Encounter (HOSPITAL_COMMUNITY): Payer: Self-pay

## 2022-04-29 ENCOUNTER — Inpatient Hospital Stay (HOSPITAL_COMMUNITY)
Admission: EM | Admit: 2022-04-29 | Discharge: 2022-05-08 | DRG: 689 | Disposition: A | Discharge status: Skilled Nursing Facility | Payer: Medicare Other | Attending: Internal Medicine | Admitting: Internal Medicine

## 2022-04-29 ENCOUNTER — Other Ambulatory Visit: Payer: Self-pay

## 2022-04-29 DIAGNOSIS — Z794 Long term (current) use of insulin: Secondary | ICD-10-CM | POA: Diagnosis not present

## 2022-04-29 DIAGNOSIS — I5043 Acute on chronic combined systolic (congestive) and diastolic (congestive) heart failure: Secondary | ICD-10-CM | POA: Diagnosis not present

## 2022-04-29 DIAGNOSIS — I5041 Acute combined systolic (congestive) and diastolic (congestive) heart failure: Secondary | ICD-10-CM

## 2022-04-29 DIAGNOSIS — Z1624 Resistance to multiple antibiotics: Secondary | ICD-10-CM | POA: Diagnosis present

## 2022-04-29 DIAGNOSIS — E860 Dehydration: Secondary | ICD-10-CM | POA: Diagnosis not present

## 2022-04-29 DIAGNOSIS — Z87442 Personal history of urinary calculi: Secondary | ICD-10-CM | POA: Diagnosis not present

## 2022-04-29 DIAGNOSIS — F0392 Unspecified dementia, unspecified severity, with psychotic disturbance: Secondary | ICD-10-CM | POA: Diagnosis not present

## 2022-04-29 DIAGNOSIS — E1165 Type 2 diabetes mellitus with hyperglycemia: Secondary | ICD-10-CM | POA: Diagnosis present

## 2022-04-29 DIAGNOSIS — E7800 Pure hypercholesterolemia, unspecified: Secondary | ICD-10-CM | POA: Diagnosis present

## 2022-04-29 DIAGNOSIS — Z1612 Extended spectrum beta lactamase (ESBL) resistance: Secondary | ICD-10-CM | POA: Diagnosis not present

## 2022-04-29 DIAGNOSIS — R1310 Dysphagia, unspecified: Secondary | ICD-10-CM | POA: Diagnosis not present

## 2022-04-29 DIAGNOSIS — A419 Sepsis, unspecified organism: Secondary | ICD-10-CM | POA: Diagnosis not present

## 2022-04-29 DIAGNOSIS — N401 Enlarged prostate with lower urinary tract symptoms: Secondary | ICD-10-CM | POA: Diagnosis present

## 2022-04-29 DIAGNOSIS — Z20822 Contact with and (suspected) exposure to covid-19: Secondary | ICD-10-CM | POA: Diagnosis not present

## 2022-04-29 DIAGNOSIS — R590 Localized enlarged lymph nodes: Secondary | ICD-10-CM | POA: Diagnosis not present

## 2022-04-29 DIAGNOSIS — Z79899 Other long term (current) drug therapy: Secondary | ICD-10-CM

## 2022-04-29 DIAGNOSIS — J810 Acute pulmonary edema: Secondary | ICD-10-CM | POA: Diagnosis not present

## 2022-04-29 DIAGNOSIS — Z9181 History of falling: Secondary | ICD-10-CM

## 2022-04-29 DIAGNOSIS — N390 Urinary tract infection, site not specified: Secondary | ICD-10-CM | POA: Diagnosis not present

## 2022-04-29 DIAGNOSIS — N3289 Other specified disorders of bladder: Secondary | ICD-10-CM | POA: Diagnosis not present

## 2022-04-29 DIAGNOSIS — Z96653 Presence of artificial knee joint, bilateral: Secondary | ICD-10-CM | POA: Diagnosis present

## 2022-04-29 DIAGNOSIS — N3281 Overactive bladder: Secondary | ICD-10-CM | POA: Diagnosis not present

## 2022-04-29 DIAGNOSIS — E119 Type 2 diabetes mellitus without complications: Secondary | ICD-10-CM | POA: Diagnosis not present

## 2022-04-29 DIAGNOSIS — K219 Gastro-esophageal reflux disease without esophagitis: Secondary | ICD-10-CM | POA: Diagnosis present

## 2022-04-29 DIAGNOSIS — I509 Heart failure, unspecified: Secondary | ICD-10-CM | POA: Diagnosis not present

## 2022-04-29 DIAGNOSIS — Z0389 Encounter for observation for other suspected diseases and conditions ruled out: Secondary | ICD-10-CM | POA: Diagnosis not present

## 2022-04-29 DIAGNOSIS — N281 Cyst of kidney, acquired: Secondary | ICD-10-CM | POA: Diagnosis present

## 2022-04-29 DIAGNOSIS — I5042 Chronic combined systolic (congestive) and diastolic (congestive) heart failure: Secondary | ICD-10-CM

## 2022-04-29 DIAGNOSIS — R6889 Other general symptoms and signs: Secondary | ICD-10-CM | POA: Diagnosis not present

## 2022-04-29 DIAGNOSIS — Z825 Family history of asthma and other chronic lower respiratory diseases: Secondary | ICD-10-CM | POA: Diagnosis not present

## 2022-04-29 DIAGNOSIS — R0602 Shortness of breath: Secondary | ICD-10-CM | POA: Diagnosis not present

## 2022-04-29 DIAGNOSIS — J90 Pleural effusion, not elsewhere classified: Secondary | ICD-10-CM | POA: Diagnosis not present

## 2022-04-29 DIAGNOSIS — I110 Hypertensive heart disease with heart failure: Secondary | ICD-10-CM | POA: Diagnosis not present

## 2022-04-29 DIAGNOSIS — Z888 Allergy status to other drugs, medicaments and biological substances status: Secondary | ICD-10-CM

## 2022-04-29 DIAGNOSIS — N400 Enlarged prostate without lower urinary tract symptoms: Secondary | ICD-10-CM | POA: Diagnosis present

## 2022-04-29 DIAGNOSIS — Z515 Encounter for palliative care: Secondary | ICD-10-CM | POA: Diagnosis not present

## 2022-04-29 DIAGNOSIS — N320 Bladder-neck obstruction: Secondary | ICD-10-CM | POA: Diagnosis not present

## 2022-04-29 DIAGNOSIS — R3914 Feeling of incomplete bladder emptying: Secondary | ICD-10-CM | POA: Diagnosis not present

## 2022-04-29 DIAGNOSIS — G20 Parkinson's disease: Secondary | ICD-10-CM | POA: Diagnosis not present

## 2022-04-29 DIAGNOSIS — I10 Essential (primary) hypertension: Secondary | ICD-10-CM | POA: Diagnosis not present

## 2022-04-29 DIAGNOSIS — B9629 Other Escherichia coli [E. coli] as the cause of diseases classified elsewhere: Secondary | ICD-10-CM | POA: Diagnosis present

## 2022-04-29 DIAGNOSIS — Z7984 Long term (current) use of oral hypoglycemic drugs: Secondary | ICD-10-CM

## 2022-04-29 DIAGNOSIS — I493 Ventricular premature depolarization: Secondary | ICD-10-CM | POA: Diagnosis not present

## 2022-04-29 DIAGNOSIS — Z803 Family history of malignant neoplasm of breast: Secondary | ICD-10-CM

## 2022-04-29 DIAGNOSIS — R739 Hyperglycemia, unspecified: Secondary | ICD-10-CM | POA: Diagnosis not present

## 2022-04-29 DIAGNOSIS — Z743 Need for continuous supervision: Secondary | ICD-10-CM | POA: Diagnosis not present

## 2022-04-29 DIAGNOSIS — R52 Pain, unspecified: Secondary | ICD-10-CM | POA: Diagnosis not present

## 2022-04-29 DIAGNOSIS — B9620 Unspecified Escherichia coli [E. coli] as the cause of diseases classified elsewhere: Secondary | ICD-10-CM | POA: Diagnosis present

## 2022-04-29 DIAGNOSIS — N3000 Acute cystitis without hematuria: Principal | ICD-10-CM | POA: Diagnosis present

## 2022-04-29 DIAGNOSIS — Z885 Allergy status to narcotic agent status: Secondary | ICD-10-CM

## 2022-04-29 DIAGNOSIS — J309 Allergic rhinitis, unspecified: Secondary | ICD-10-CM | POA: Diagnosis not present

## 2022-04-29 DIAGNOSIS — Z8249 Family history of ischemic heart disease and other diseases of the circulatory system: Secondary | ICD-10-CM

## 2022-04-29 DIAGNOSIS — R531 Weakness: Secondary | ICD-10-CM | POA: Diagnosis not present

## 2022-04-29 LAB — CBC WITH DIFFERENTIAL/PLATELET
Abs Immature Granulocytes: 0.03 10*3/uL (ref 0.00–0.07)
Basophils Absolute: 0 10*3/uL (ref 0.0–0.1)
Basophils Relative: 0 %
Eosinophils Absolute: 0.1 10*3/uL (ref 0.0–0.5)
Eosinophils Relative: 1 %
HCT: 40.8 % (ref 39.0–52.0)
Hemoglobin: 13.3 g/dL (ref 13.0–17.0)
Immature Granulocytes: 0 %
Lymphocytes Relative: 7 %
Lymphs Abs: 0.7 10*3/uL (ref 0.7–4.0)
MCH: 29.8 pg (ref 26.0–34.0)
MCHC: 32.6 g/dL (ref 30.0–36.0)
MCV: 91.5 fL (ref 80.0–100.0)
Monocytes Absolute: 1 10*3/uL (ref 0.1–1.0)
Monocytes Relative: 10 %
Neutro Abs: 8.1 10*3/uL — ABNORMAL HIGH (ref 1.7–7.7)
Neutrophils Relative %: 82 %
Platelets: 245 10*3/uL (ref 150–400)
RBC: 4.46 MIL/uL (ref 4.22–5.81)
RDW: 14 % (ref 11.5–15.5)
WBC: 9.8 10*3/uL (ref 4.0–10.5)
nRBC: 0 % (ref 0.0–0.2)

## 2022-04-29 LAB — COMPREHENSIVE METABOLIC PANEL
ALT: 5 U/L (ref 0–44)
AST: 10 U/L — ABNORMAL LOW (ref 15–41)
Albumin: 3.3 g/dL — ABNORMAL LOW (ref 3.5–5.0)
Alkaline Phosphatase: 89 U/L (ref 38–126)
Anion gap: 13 (ref 5–15)
BUN: 17 mg/dL (ref 8–23)
CO2: 20 mmol/L — ABNORMAL LOW (ref 22–32)
Calcium: 8.9 mg/dL (ref 8.9–10.3)
Chloride: 104 mmol/L (ref 98–111)
Creatinine, Ser: 0.98 mg/dL (ref 0.61–1.24)
GFR, Estimated: >60 mL/min (ref >60)
Glucose, Bld: 207 mg/dL — ABNORMAL HIGH (ref 70–99)
Potassium: 4.5 mmol/L (ref 3.5–5.1)
Sodium: 137 mmol/L (ref 135–145)
Total Bilirubin: 0.6 mg/dL (ref 0.3–1.2)
Total Protein: 6.8 g/dL (ref 6.5–8.1)

## 2022-04-29 LAB — RESP PANEL BY RT-PCR (FLU A&B, COVID) ARPGX2
Influenza A by PCR: NEGATIVE
Influenza B by PCR: NEGATIVE
SARS Coronavirus 2 by RT PCR: NEGATIVE

## 2022-04-29 LAB — URINALYSIS, ROUTINE W REFLEX MICROSCOPIC
Bilirubin Urine: NEGATIVE
Glucose, UA: 150 mg/dL — AB
Ketones, ur: 5 mg/dL — AB
Nitrite: POSITIVE — AB
Protein, ur: 100 mg/dL — AB
Specific Gravity, Urine: 1.015 (ref 1.005–1.030)
WBC, UA: >50 WBC/hpf — ABNORMAL HIGH (ref 0–5)
pH: 6 (ref 5.0–8.0)

## 2022-04-29 LAB — PROTIME-INR
INR: 1 (ref 0.8–1.2)
Prothrombin Time: 13.4 seconds (ref 11.4–15.2)

## 2022-04-29 LAB — LACTIC ACID, PLASMA
Lactic Acid, Venous: 1.7 mmol/L (ref 0.5–1.9)
Lactic Acid, Venous: 1.2 mmol/L (ref 0.5–1.9)

## 2022-04-29 LAB — APTT: aPTT: 24 seconds (ref 24–36)

## 2022-04-29 LAB — CBG MONITORING, ED: Glucose-Capillary: 126 mg/dL — ABNORMAL HIGH (ref 70–99)

## 2022-04-29 MED ORDER — POTASSIUM CHLORIDE CRYS ER 10 MEQ PO TBCR
10.0000 meq | EXTENDED_RELEASE_TABLET | Freq: Two times a day (BID) | ORAL | Status: DC
  Administered 2022-04-29 – 2022-05-04 (×11): 10 meq via ORAL
  Filled 2022-04-29 (×11): qty 1

## 2022-04-29 MED ORDER — ROPINIROLE HCL 0.25 MG PO TABS
1.5000 mg | ORAL_TABLET | Freq: Every day | ORAL | Status: DC
  Administered 2022-04-29 – 2022-05-07 (×9): 1.5 mg via ORAL
  Filled 2022-04-29 (×6): qty 6
  Filled 2022-04-29: qty 2
  Filled 2022-04-29 (×2): qty 6

## 2022-04-29 MED ORDER — METHENAMINE MANDELATE 0.5 G PO TABS
1000.0000 mg | ORAL_TABLET | Freq: Two times a day (BID) | ORAL | Status: DC
  Administered 2022-04-29 – 2022-05-08 (×18): 1000 mg via ORAL
  Filled 2022-04-29 (×19): qty 2

## 2022-04-29 MED ORDER — VITAMIN C 250 MG PO TABS
250.0000 mg | ORAL_TABLET | Freq: Two times a day (BID) | ORAL | Status: DC
  Administered 2022-04-30 – 2022-05-08 (×17): 250 mg via ORAL
  Filled 2022-04-29 (×18): qty 1

## 2022-04-29 MED ORDER — SODIUM CHLORIDE 0.9 % IV SOLN
1.0000 g | Freq: Once | INTRAVENOUS | Status: AC
  Administered 2022-04-29: 1 g via INTRAVENOUS
  Filled 2022-04-29: qty 20

## 2022-04-29 MED ORDER — CARBIDOPA-LEVODOPA 25-100 MG PO TABS
3.0000 | ORAL_TABLET | ORAL | Status: DC
  Administered 2022-04-30 – 2022-05-08 (×42): 3 via ORAL
  Filled 2022-04-29 (×45): qty 3

## 2022-04-29 MED ORDER — VITAMIN D 25 MCG (1000 UNIT) PO TABS
2000.0000 [IU] | ORAL_TABLET | Freq: Every day | ORAL | Status: DC
  Administered 2022-04-29 – 2022-05-08 (×10): 2000 [IU] via ORAL
  Filled 2022-04-29 (×10): qty 2

## 2022-04-29 MED ORDER — INSULIN ASPART 100 UNIT/ML IJ SOLN
0.0000 [IU] | Freq: Three times a day (TID) | INTRAMUSCULAR | Status: DC
  Administered 2022-04-29: 1 [IU] via SUBCUTANEOUS
  Administered 2022-04-30 – 2022-05-01 (×4): 2 [IU] via SUBCUTANEOUS
  Administered 2022-05-01: 1 [IU] via SUBCUTANEOUS
  Administered 2022-05-01 – 2022-05-02 (×4): 2 [IU] via SUBCUTANEOUS
  Administered 2022-05-02 – 2022-05-03 (×3): 3 [IU] via SUBCUTANEOUS
  Administered 2022-05-03: 7 [IU] via SUBCUTANEOUS
  Administered 2022-05-03 (×2): 3 [IU] via SUBCUTANEOUS
  Administered 2022-05-04: 2 [IU] via SUBCUTANEOUS
  Administered 2022-05-04: 5 [IU] via SUBCUTANEOUS
  Administered 2022-05-04: 3 [IU] via SUBCUTANEOUS
  Administered 2022-05-04: 7 [IU] via SUBCUTANEOUS
  Administered 2022-05-05: 2 [IU] via SUBCUTANEOUS
  Administered 2022-05-05: 5 [IU] via SUBCUTANEOUS
  Administered 2022-05-05: 2 [IU] via SUBCUTANEOUS
  Administered 2022-05-05 – 2022-05-06 (×3): 3 [IU] via SUBCUTANEOUS
  Administered 2022-05-06: 7 [IU] via SUBCUTANEOUS
  Administered 2022-05-06: 5 [IU] via SUBCUTANEOUS
  Administered 2022-05-07: 9 [IU] via SUBCUTANEOUS
  Administered 2022-05-07: 1 [IU] via SUBCUTANEOUS
  Administered 2022-05-07: 5 [IU] via SUBCUTANEOUS
  Administered 2022-05-08: 2 [IU] via SUBCUTANEOUS
  Administered 2022-05-08: 5 [IU] via SUBCUTANEOUS
  Filled 2022-04-29: qty 0.09

## 2022-04-29 MED ORDER — LACTATED RINGERS IV BOLUS (SEPSIS)
1000.0000 mL | Freq: Once | INTRAVENOUS | Status: AC
  Administered 2022-04-29: 1000 mL via INTRAVENOUS

## 2022-04-29 MED ORDER — ROPINIROLE HCL 1 MG PO TABS
3.0000 mg | ORAL_TABLET | Freq: Two times a day (BID) | ORAL | Status: DC
  Administered 2022-04-30 – 2022-05-08 (×18): 3 mg via ORAL
  Filled 2022-04-29 (×18): qty 3

## 2022-04-29 MED ORDER — PANTOPRAZOLE SODIUM 40 MG PO TBEC
40.0000 mg | DELAYED_RELEASE_TABLET | Freq: Every day | ORAL | Status: DC
  Administered 2022-04-29 – 2022-05-08 (×10): 40 mg via ORAL
  Filled 2022-04-29 (×10): qty 1

## 2022-04-29 MED ORDER — LISINOPRIL 5 MG PO TABS
5.0000 mg | ORAL_TABLET | Freq: Every day | ORAL | Status: DC
  Administered 2022-04-29 – 2022-05-04 (×6): 5 mg via ORAL
  Filled 2022-04-29 (×6): qty 1

## 2022-04-29 MED ORDER — TAMSULOSIN HCL 0.4 MG PO CAPS
0.4000 mg | ORAL_CAPSULE | Freq: Two times a day (BID) | ORAL | Status: DC
  Administered 2022-04-29 – 2022-05-08 (×18): 0.4 mg via ORAL
  Filled 2022-04-29 (×18): qty 1

## 2022-04-29 MED ORDER — SODIUM CHLORIDE 0.9 % IV SOLN: 2.0000 g | INTRAVENOUS | Status: DC

## 2022-04-29 MED ORDER — LACTATED RINGERS IV SOLN: INTRAVENOUS | Status: AC

## 2022-04-29 MED ORDER — ENOXAPARIN SODIUM 40 MG/0.4ML IJ SOSY
40.0000 mg | PREFILLED_SYRINGE | INTRAMUSCULAR | Status: DC
  Administered 2022-04-29 – 2022-05-07 (×9): 40 mg via SUBCUTANEOUS
  Filled 2022-04-29 (×9): qty 0.4

## 2022-04-29 MED ORDER — VITAMIN B-12 1000 MCG PO TABS
1000.0000 ug | ORAL_TABLET | Freq: Every day | ORAL | Status: DC
Start: 2022-04-29 — End: 2022-05-08
  Administered 2022-04-30 – 2022-05-08 (×9): 1000 ug via ORAL
  Filled 2022-04-29 (×10): qty 1

## 2022-04-29 MED ORDER — ACETAMINOPHEN 325 MG PO TABS
650.0000 mg | ORAL_TABLET | Freq: Four times a day (QID) | ORAL | Status: DC | PRN
Start: 2022-04-29 — End: 2022-05-08
  Administered 2022-04-29 – 2022-04-30 (×2): 650 mg via ORAL
  Filled 2022-04-29 (×2): qty 2

## 2022-04-29 MED ORDER — ENTACAPONE 200 MG PO TABS
200.0000 mg | ORAL_TABLET | ORAL | Status: DC
  Administered 2022-04-30 – 2022-05-08 (×26): 200 mg via ORAL
  Filled 2022-04-29 (×27): qty 1

## 2022-04-29 MED ORDER — ACETAMINOPHEN 325 MG PO TABS
650.0000 mg | ORAL_TABLET | Freq: Once | ORAL | Status: AC
  Administered 2022-04-29: 650 mg via ORAL
  Filled 2022-04-29: qty 2

## 2022-04-29 MED ORDER — NAPROXEN SODIUM 275 MG PO TABS: 550.0000 mg | ORAL_TABLET | Freq: Four times a day (QID) | ORAL | Status: DC | PRN

## 2022-04-29 MED ORDER — SODIUM CHLORIDE 0.9 % IV SOLN
1.0000 g | Freq: Three times a day (TID) | INTRAVENOUS | Status: AC
  Administered 2022-04-30 – 2022-05-04 (×14): 1 g via INTRAVENOUS
  Filled 2022-04-29 (×2): qty 20
  Filled 2022-04-29: qty 1
  Filled 2022-04-29 (×6): qty 20
  Filled 2022-04-29: qty 1
  Filled 2022-04-29 (×4): qty 20

## 2022-04-29 MED ORDER — TRAMADOL HCL 50 MG PO TABS
50.0000 mg | ORAL_TABLET | Freq: Four times a day (QID) | ORAL | Status: DC | PRN
  Administered 2022-04-29 – 2022-05-01 (×2): 50 mg via ORAL
  Filled 2022-04-29 (×3): qty 1

## 2022-04-29 NOTE — ED Provider Notes (Signed)
[facility] DEPT Provider Note   CSN: [id number] Arrival date & time: 04/29/22  1616     History  Chief Complaint  Patient presents with   Weakness    [name] is a 76 y.o. male.  Is here with weakness, concern for UTI.  History of Parkinson's disease.  Recent admission for UTI.  States feels like the same.  Feels like he is dehydrated.  Denies any chest pain, shortness of breath, cough, sputum production.  No abdominal pain.  Nothing makes it worse or better.  He is felt bad the last 2 days.  Does finish antibiotics.  The history is provided by the patient.       Home Medications Prior to Admission medications   Medication Sig Start Date End Date Taking? Authorizing Provider  acetaminophen (TYLENOL) 500 MG tablet Take 1,000 mg by mouth every 6 (six) hours as needed (for pain).   Yes [provider]  ALEVE 220 MG tablet Take 660 mg by mouth every 6 (six) hours as needed (for pain).   Yes [provider]  Ascorbic Acid (VITAMIN C) 100 MG tablet Take 100 mg by mouth in the morning and at bedtime.   Yes [provider]  calcium carbonate (TUMS) 500 MG chewable tablet Chew 2 tablets by mouth daily as needed for indigestion or heartburn.   Yes [provider]  carbidopa-levodopa (SINEMET IR) 25-100 MG tablet Take 3 tablets by mouth 4 (four) times daily. Patient taking differently: Take 3 tablets by mouth every 3 (three) hours. Take 3 tablets at 6 am, 9am, 12pm, 3pm & Take 2 additional tablets around 8 pm if still awake 03/23/22  Yes [name], [name], DO  furosemide (LASIX) 20 MG tablet Take 2 tablets (40 mg total) by mouth daily. Patient taking differently: Take 20 mg by mouth daily as needed for fluid or edema. 03/07/22  Yes [name], MD  LANTUS SOLOSTAR 100 UNIT/ML Solostar Pen Inject 15-20 Units into the skin at bedtime. 05/03/21  Yes [provider]  lisinopril (ZESTRIL) 5 MG tablet Take 5 mg by mouth daily.  12/06/21  Yes [provider]  metFORMIN (GLUCOPHAGE) 500 MG tablet Take 500 mg by mouth 2 (two) times daily with a meal.   Yes [provider]  nitrofurantoin, macrocrystal-monohydrate, (MACROBID) 100 MG capsule Take 1 capsule (100 mg total) by mouth daily. Patient taking differently: Take 100 mg by mouth 2 (two) times daily. 03/28/22 03/23/23 Yes Vu, [name], MD  omeprazole (PRILOSEC) 20 MG capsule Take 20 mg by mouth daily as needed (acid reflux).   Yes [provider]  potassium chloride SA (KLOR-CON) 20 MEQ tablet Take 10 mEq by mouth 2 (two) times daily. 06/16/21  Yes [provider]  rOPINIRole (REQUIP) 3 MG tablet Take 1 tablet in the morning, Take  1 in the afternoon, Take a  half tablet in the evening Patient taking differently: Take 1.5-3 mg by mouth See admin instructions. Take 1 tablet in the morning, Take  1 in the afternoon, Take a  half tablet in the evening 02/27/22  Yes [name], [name], DO  tamsulosin (FLOMAX) 0.4 MG CAPS capsule Take 0.4 mg by mouth in the morning and at bedtime. 01/24/18  Yes [provider]  triamcinolone (NASACORT) 55 MCG/ACT AERO nasal inhaler Place 2 sprays into the nose daily as needed (for allergies or rhinitis).   Yes [provider]  vitamin B-12 (CYANOCOBALAMIN) 1000 MCG tablet Take 1,000 mcg  by mouth daily.   Yes [provider]  cholecalciferol (VITAMIN D) 1000 UNITS tablet Take 2,000 Units by mouth daily.    [provider]  clotrimazole (LOTRIMIN AF) 1 % cream Apply 1 application. topically 2 (two) times daily. 03/13/22   Vu, [name], MD  entacapone (COMTAN) 200 MG tablet TAKE 1 TABLET BY MOUTH 3 TIMES DAILY (WITH FIRST THREE DOSAGES OF CARBIDOPA/LEVODOPA). Patient taking differently: Take 200 mg by mouth 3 (three) times daily. 04/02/22   [name], [name], DO  GLOBAL EASE INJECT PEN NEEDLES 31G X 5 MM MISC Inject into the skin. 05/09/21   [provider]  hydrocortisone cream 0.5 %  Apply 1 application. topically 2 (two) times daily. 03/13/22   Vu, [name], MD  methenamine (MANDELAMINE) 1 g tablet Take 1 tablet (1,000 mg total) by mouth 2 (two) times daily. Take with any vitamin c, twice a day for uti prevention 03/13/22 03/08/23  Vu, [name], MD      Allergies    Nucynta [tapentadol], Oxycodone, Sudafed [pseudoephedrine hcl], and Tramadol hcl    Review of Systems   Review of Systems  Physical Exam Updated Vital Signs BP (!) 180/87   Pulse 73   Temp (!) 101.4 F (38.6 C) (Rectal)   Resp 20   Wt 86.2 kg   SpO2 94%   BMI 25.77 kg/m  Physical Exam Vitals and nursing note reviewed.  Constitutional:      General: He is not in acute distress.    Appearance: He is well-developed. He is not ill-appearing.  HENT:     Head: Normocephalic and atraumatic.     Nose: Nose normal.     Mouth/Throat:     Mouth: Mucous membranes are moist.  Eyes:     Extraocular Movements: Extraocular movements intact.     Conjunctiva/sclera: Conjunctivae normal.     Pupils: Pupils are equal, round, and reactive to light.  Cardiovascular:     Rate and Rhythm: Normal rate and regular rhythm.     Pulses: Normal pulses.     Heart sounds: No murmur heard. Pulmonary:     Effort: Pulmonary effort is normal. No respiratory distress.     Breath sounds: Normal breath sounds.  Abdominal:     Palpations: Abdomen is soft.     Tenderness: There is no abdominal tenderness.  Musculoskeletal:        General: No swelling.     Cervical back: Neck supple.  Skin:    General: Skin is warm and dry.     Capillary Refill: Capillary refill takes less than 2 seconds.  Neurological:     Mental Status: He is alert.  Psychiatric:        Mood and Affect: Mood normal.     ED Results / Procedures / Treatments   Labs (all labs ordered are listed, but only abnormal results are displayed) Labs Reviewed  COMPREHENSIVE METABOLIC PANEL - Abnormal; Notable for the following components:      Result Value    CO2 20 (*)    Glucose, Bld 207 (*)    Albumin 3.3 (*)    AST 10 (*)    All other components within normal limits  CBC WITH DIFFERENTIAL/PLATELET - Abnormal; Notable for the following components:   Neutro Abs 8.1 (*)    All other components within normal limits  URINALYSIS, ROUTINE W REFLEX MICROSCOPIC - Abnormal; Notable for the following components:   APPearance HAZY (*)    Glucose, UA 150 (*)  Hgb urine dipstick SMALL (*)    Ketones, ur 5 (*)    Protein, ur 100 (*)    Nitrite POSITIVE (*)    Leukocytes,Ua LARGE (*)    WBC, UA >50 (*)    Bacteria, UA MANY (*)    All other components within normal limits  RESP PANEL BY RT-PCR (FLU A&B, COVID) ARPGX2  CULTURE, BLOOD (ROUTINE X 2)  CULTURE, BLOOD (ROUTINE X 2)  URINE CULTURE  LACTIC ACID, PLASMA  LACTIC ACID, PLASMA  PROTIME-INR  APTT    EKG EKG Interpretation  Date/Time:  Sunday April 29 2022 16:34:31 EDT Ventricular Rate:  75 PR Interval:  136 QRS Duration: 119 QT Interval:  409 QTC Calculation: 457 R Axis:   -40 Text Interpretation: Sinus rhythm Multiple premature complexes, vent & supraven Incomplete left bundle branch block Confirmed by Lennice Sites ([id number]) on 04/29/2022 5:06:42 PM  Radiology DG Chest Port 1 View  Result Date: 04/29/2022 CLINICAL DATA:  Possible sepsis EXAM: PORTABLE CHEST 1 VIEW COMPARISON:  04/20/2022 FINDINGS: Transverse diameter of the heart is in the upper limits of normal. There are no signs of pulmonary edema or focal pulmonary consolidation. Left hemidiaphragm is elevated. There is no pleural effusion or pneumothorax. IMPRESSION: No active disease. Electronically Signed   By: Elmer Picker M.D.   On: 04/29/2022 17:27    Procedures .Critical Care  Performed by: Lennice Sites, DO Authorized by: Lennice Sites, DO   Critical care provider statement:    Critical care time (minutes):  35   Critical care was necessary to treat or prevent imminent or life-threatening deterioration of  the following conditions:  Sepsis   Critical care was time spent personally by me on the following activities:  Blood draw for specimens, development of treatment plan with patient or surrogate, evaluation of patient's response to treatment, examination of patient, discussions with primary provider, obtaining history from patient or surrogate, ordering and performing treatments and interventions, ordering and review of laboratory studies, ordering and review of radiographic studies, pulse oximetry, re-evaluation of patient's condition and review of old charts   Care discussed with: admitting provider       Medications Ordered in ED Medications  lactated ringers infusion (has no administration in time range)  lactated ringers bolus 1,000 mL (1,000 mLs Intravenous New Bag/Given 04/29/22 1716)  meropenem (MERREM) 1 g in sodium chloride 0.9 % 100 mL IVPB (1 g Intravenous New Bag/Given 04/29/22 1716)  acetaminophen (TYLENOL) tablet 650 mg (650 mg Oral Given 04/29/22 1728)    ED Course/ Medical Decision Making/ A&P                           Medical Decision Making Amount and/or Complexity of Data Reviewed Labs: ordered. Radiology: ordered. ECG/medicine tests: ordered.  Risk OTC drugs. Prescription drug management. Decision regarding hospitalization.   [name] is here with weakness.  History of Parkinson's disease.  Recent admission for UTI.  History of per my review of his chart ESBL UTI.  Patient with fever of 101.4.  Otherwise unremarkable vitals.  Heart rate is in the 90s.  Code sepsis initiated.  Will consult pharmacy regarding antibiotics but suspect IV meropenem.  Will give fluid bolus.  Will check CBC, blood culture, lactic acid.  Anticipate admission.  Per my review and interpretation of urinalysis, consistent with infection.  White count of 9.8.  Normal lactic acid.  Chest x-ray per my review interpretation with no pneumonia.  Overall  anticipate UTI again.  Will admit to medicine  for further sepsis care.  Hemodynamically stable throughout my care.  This chart was dictated using voice recognition software.  Despite best efforts to proofread,  errors can occur which can change the documentation meaning.         Final Clinical Impression(s) / ED Diagnoses Final diagnoses:  Sepsis, due to unspecified organism, unspecified whether acute organ dysfunction present [facility])  Acute cystitis without hematuria    Rx / DC Orders ED Discharge Orders     None         Lennice Sites, DO 04/29/22 1820

## 2022-04-29 NOTE — ED Notes (Signed)
RN attempted 2 times to insert PIV.

## 2022-04-29 NOTE — Assessment & Plan Note (Signed)
Worsening weakness likely due to current UTI -continue home Sinemet, entacapone and requip

## 2022-04-29 NOTE — Progress Notes (Signed)
A consult was received from an ED physician for meropenem per pharmacy dosing.  The patient's profile has been reviewed for ht/wt/allergies/indication/available labs.    A one time order has been placed for meropenem 1 g IV once.    Further antibiotics/pharmacy consults should be ordered by admitting physician if indicated.                       Thank you, Lenis Noon, PharmD 04/29/2022  4:45 PM

## 2022-04-29 NOTE — Progress Notes (Signed)
Pharmacy Antibiotic Note  [name] is a 76 y.o. male admitted on 04/29/2022 with UTI.  Pharmacy has been consulted for meropenem dosing.  Plan: Meropenem 1 gm q8h F/u culture data  Weight: 86.2 kg (190 lb)  Temp (24hrs), Avg:99.7 F (37.6 C), Min:98.6 F (37 C), Max:101.4 F (38.6 C)  Recent Labs  Lab 04/29/22 1640 04/29/22 1752  WBC 9.8  --   CREATININE 0.98  --   LATICACIDVEN 1.7 1.2    Estimated Creatinine Clearance: 71.5 mL/min (by C-G formula based on SCr of 0.98 mg/dL).    Allergies  Allergen Reactions   Nucynta [Tapentadol] Other (See Comments)    Made the patient not feel well. "He did not feel well at all."   Oxycodone Itching   Sudafed [Pseudoephedrine Hcl] Other (See Comments)    Problems urinating    Tramadol Hcl Itching and Other (See Comments)    Can tolerate, however (in 2023)    Antimicrobials this admission: 7/16 meropenem>> Dose adjustments this admission:  Microbiology results: 7/16 BCx2: sent 7/16 UCx sent PTA: 7/7 UCx catherized: > 100 K ESBL Klebsiella pneumoniae R to all X imipenem 7/7 BCx2 ngF  Thank you for allowing pharmacy to be a part of this patient's care.  Eudelia Bunch, Pharm.D 04/29/2022 8:15 PM

## 2022-04-29 NOTE — Assessment & Plan Note (Signed)
With hyperglycemia.  -Normally on home 15-20 Lantus qHS and metformin. Has already received 15U prior to arrival since he has CBG of >300 at home. -continue SSI ACHS for tonight

## 2022-04-29 NOTE — Assessment & Plan Note (Signed)
Has been an ongoing recurrent issue. Admitted in March for the same and required erapenem for 2 weeks. Started of Macrobid prophylaxis since 5/16. Hospitalized again in May and was treated with fosfomycin once a week. - Follows with ID Dr. 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 No issues with retention this time.  -Continue IV meropenem and follow urine cultures.  -need ID consult in the morning for length of treatment

## 2022-04-29 NOTE — ED Triage Notes (Signed)
Pt BIB EMS for generalized weakness that started today. Pt has hx of Parkinsons. Pt has hx of UTIs.

## 2022-04-29 NOTE — Sepsis Progress Note (Signed)
Sepsis protocol is being followed by [facility]. 

## 2022-04-29 NOTE — Assessment & Plan Note (Signed)
-  worsening over the past week. Could be worsen progression of Parkinson -speech evaluation here but will need close follow up with neurology outpt

## 2022-04-29 NOTE — Assessment & Plan Note (Signed)
Continue Lisinopril 

## 2022-04-29 NOTE — H&P (Signed)
History and Physical    Patient: [name] OHY:[id number] DOB: 07-24-1946 DOA: 04/29/2022 DOS: the patient was seen and examined on 04/29/2022 PCP: Josetta Huddle, MD  Patient coming from: Home  Chief Complaint:  Chief Complaint  Patient presents with   Weakness   HPI: [name] is a 76 y.o. 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   He was last admitted on 04/20/2022 - 04/22/2022 for progressive malaise and was treated with IV meropenem for 2 days but was discontinued by ID due to absence of localizing symptoms of UTI, fever or leukocytosis.  Wife says since then he has not really recovered and has been weaker. He fell and had T and L spine fractures in June and has been recovering from that and using a walker. Now he is even weaknr. Also,has not been taking oral intake well due to dysphagia in the past week. She is frustrated because he has had recurrent hospitalized in March and in May for UTI.    He is followed by ID for recurrent ESBL UTI and on Macrobid prophylaxis.   In the ED, he was febrile to 101.7F, intermittently hypertensive and on room air.No leukocytosis.  CMP without any significant electrolyte abnormalities.  UA shows large leukocyte, positive nitrite and many bacteria.  Chest x-ray was negative.  He was started on IV meropenem and hospitalist was consulted for further management. Review of Systems: unable to review all systems due to the inability of the patient to answer questions. Past Medical History:  Diagnosis Date   Abnormal involuntary movement 02/18/2018   Allergic rhinitis    Arthritis    BPH (benign prostatic hyperplasia)    Diabetes ([place])    Dysphagia, pharyngeal phase    GERD diagnosed on barium swallow. Has small hiatal hernia.  Symptomatically somewhat better on omeprazole but not entirely. We'll try b.i.d. therapy   Edema    1+ in both ankles, likely multifactorial including medication such as Requip   Erectile dysfunction    Staxyn 10 mg or Viagra worked well. 3 samples of Cialis 20 mg provided   GERD (gastroesophageal reflux disease)    Hypercholesteremia    Hypertension    Nephrolithiasis    Onychomycosis of toenail    April 27, 2013 - Dr. Inocencio Homes - podiatry, was in [place] - treating with oral Lamisil and topical nail therapy   Parkinson's disease [facility])     followed by Dr. Lezlie Octave at [facility] and [name], M.D. in [place]    Presbycusis    and tinnitus - Dr. Izora Gala - August/2013   Renal calculus    Syncope    Past Surgical History:  Procedure Laterality Date   HAND SURGERY     INGUINAL HERNIA REPAIR Left 11/04/2015   Procedure: LEFT INGUINAL HERNIA REPAIR WITH MESH;  Surgeon: Armandina Gemma, MD;  Location: [place];  Service: General;  Laterality: Left;   INSERTION OF MESH Left 11/04/2015   Procedure: INSERTION OF MESH;  Surgeon: Armandina Gemma, MD;  Location: [place];  Service: General;  Laterality: Left;   JOINT REPLACEMENT Bilateral    KNEE SURGERY     TOTAL KNEE ARTHROPLASTY     Social History:  reports that he has never smoked. He has never used smokeless tobacco. He reports that he does not  drink alcohol and does not use drugs.  Allergies  Allergen Reactions   Nucynta [Tapentadol] Other (See Comments)    Made the patient not feel well. "He did not feel well at all."   Oxycodone Itching   Sudafed [Pseudoephedrine Hcl] Other (See Comments)    Problems urinating    Tramadol Hcl Itching and Other (See Comments)    Can tolerate, however (in 2023)    Family History  Problem Relation Age of Onset   Atrial fibrillation Mother    Breast cancer Mother    Emphysema Father    Heart disease Father    Cancer Brother        African [place]    Prior to  Admission medications   Medication Sig Start Date End Date Taking? Authorizing Provider  acetaminophen (TYLENOL) 500 MG tablet Take 1,000 mg by mouth every 6 (six) hours as needed (for pain).   Yes [provider]  ALEVE 220 MG tablet Take 660 mg by mouth every 6 (six) hours as needed (for pain).   Yes [provider]  amoxicillin (AMOXIL) 500 MG capsule Take 2,000 mg by mouth See admin instructions. Take 2,000 mg by mouth one hour prior to dental appointments   Yes [provider]  Ascorbic Acid (VITAMIN C) 100 MG tablet Take 100 mg by mouth in the morning and at bedtime.   Yes [provider]  calcium carbonate (TUMS) 500 MG chewable tablet Chew 2 tablets by mouth daily as needed for indigestion or heartburn.   Yes [provider]  carbidopa-levodopa (SINEMET IR) 25-100 MG tablet Take 3 tablets by mouth 4 (four) times daily. Patient taking differently: Take 3 tablets by mouth See admin instructions. Take 3 tablets by mouth at 6 AM, 9 AM, 12 NOON, 3 PM, and 6 PM, but can sometimes average only a sum total of 13 tablets/day 03/23/22  Yes [name], [name], DO  cholecalciferol (VITAMIN D) 1000 UNITS tablet Take 2,000 Units by mouth daily.   Yes [provider]  clotrimazole (LOTRIMIN AF) 1 % cream Apply 1 application. topically 2 (two) times daily. Patient taking differently: Apply 1 application  topically 2 (two) times daily as needed (for yeast infections resulting from taking antibiotics). 03/13/22  Yes Vu, [name], MD  entacapone (COMTAN) 200 MG tablet TAKE 1 TABLET BY MOUTH 3 TIMES DAILY (WITH FIRST THREE DOSAGES OF CARBIDOPA/LEVODOPA). Patient taking differently: Take 200 mg by mouth See admin instructions. Take 200 mg by mouth at 6 AM, 9 AM, and 12 NOON 04/02/22  Yes [name], [name], DO  furosemide (LASIX) 20 MG tablet Take 2 tablets (40 mg total) by mouth daily. Patient taking differently: Take 20 mg by mouth daily as needed for fluid or edema.  03/07/22  Yes [name], MD  hydrocortisone cream 0.5 % Apply 1 application. topically 2 (two) times daily. Patient taking differently: Apply 1 application  topically 2 (two) times daily as needed for itching. 03/13/22  Yes Vu, [name], MD  LANTUS SOLOSTAR 100 UNIT/ML Solostar Pen Inject 15-20 Units into the skin at bedtime. 05/03/21  Yes [provider]  lisinopril (ZESTRIL) 5 MG tablet Take 5 mg by mouth daily. 12/06/21  Yes [provider]  metFORMIN (GLUCOPHAGE) 500 MG tablet Take 500 mg by mouth 2 (two) times daily with a meal.   Yes [provider]  methenamine (MANDELAMINE) 1 g tablet Take 1 tablet (1,000 mg total) by mouth 2 (two) times daily. Take with any vitamin  c, twice a day for uti prevention Patient taking differently: Take 1,000 mg by mouth in the morning and at bedtime. 03/13/22 03/08/23 Yes Vu, [name], MD  nitrofurantoin, macrocrystal-monohydrate, (MACROBID) 100 MG capsule Take 1 capsule (100 mg total) by mouth daily. Patient taking differently: Take 100 mg by mouth 2 (two) times daily. 03/28/22 03/23/23 Yes Vu, [name], MD  omeprazole (PRILOSEC) 20 MG capsule Take 20 mg by mouth daily as needed (acid reflux).   Yes [provider]  potassium chloride SA (KLOR-CON) 20 MEQ tablet Take 10 mEq by mouth 2 (two) times daily. 06/16/21  Yes [provider]  rOPINIRole (REQUIP) 3 MG tablet Take 1 tablet in the morning, Take  1 in the afternoon, Take a  half tablet in the evening Patient taking differently: Take 1.5-3 mg by mouth See admin instructions. Take 3 mg by mouth in the morning and afternoon. Take 1.5 mg in the evening. 02/27/22  Yes [name], [name], DO  tamsulosin (FLOMAX) 0.4 MG CAPS capsule Take 0.4 mg by mouth in the morning and at bedtime. 01/24/18  Yes [provider]  traMADol (ULTRAM) 50 MG tablet Take 50 mg by mouth every 6 (six) hours as needed (for pain).   Yes [provider]  triamcinolone (NASACORT) 55 MCG/ACT AERO nasal  inhaler Place 2 sprays into the nose daily as needed (for allergies or rhinitis).   Yes [provider]  vitamin B-12 (CYANOCOBALAMIN) 1000 MCG tablet Take 1,000 mcg by mouth daily.   Yes [provider]  GLOBAL EASE INJECT PEN NEEDLES 31G X 5 MM MISC Inject into the skin. 05/09/21   [provider]    Physical Exam: Vitals:   04/29/22 1815 04/29/22 1830 04/29/22 1832 04/29/22 1930  BP: (!) 180/87 137/61  (!) 144/62  Pulse: 73 73  63  Resp: '20 20  13  '$ Temp:   98.6 F (37 C)   TempSrc:   Oral   SpO2: 94% 96%  100%  Weight:       Constitutional: NAD, calm, comfortable, nontoxic appearing elderly male laying flat in bed mostly with eyes closed. Not talkative and allow wife to give history. Eyes: Lids and conjunctivae normal ENMT: Mucous membranes are moist.  Neck: normal, supple, Respiratory: clear to auscultation bilaterally, no wheezing, no crackles. Normal respiratory effort.  Cardiovascular: Regular rate and rhythm, no murmurs / rubs / gallops. No extremity edema.   Abdomen: no tenderness,  Bowel sounds positive.  Musculoskeletal: no clubbing / cyanosis. No joint deformity upper and lower extremities. Normal muscle tone.  Skin: no rashes, lesions, ulcers.  Neurologic: CN 2-12 grossly intact. normal. Strength 5/5 in all 4.  Psychiatric: Normal judgment and insight. Alert and oriented x 3. Normal mood. Data Reviewed:  See HPI  Assessment and Plan: * UTI due to extended-spectrum beta lactamase (ESBL) producing Escherichia coli Has been an ongoing recurrent issue. Admitted in March for the same and required erapenem for 2 weeks. Started of Macrobid prophylaxis since 5/16. Hospitalized again in May and was treated with fosfomycin once a week. - Follows with ID Dr. 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 No issues with retention this time.   -Continue IV meropenem and follow urine cultures.  -need ID consult in the morning for length of treatment  Dysphagia -worsening over the past week. Could be worsen progression of Parkinson -speech evaluation here but  will need close follow up with neurology outpt  HTN (hypertension) Continue Lisinopril  Diabetes ([place]) With hyperglycemia.  -Normally on home 15-20 Lantus qHS and metformin. Has already received 15U prior to arrival since he has CBG of >300 at home. -continue SSI ACHS for tonight  Parkinson disease ([place]) Worsening weakness likely due to current UTI -continue home Sinemet, entacapone and requip      Advance Care Planning:   Code Status: Full Code   Consults: needs ID consult in the morning  Family Communication: Discussed with wife at bedside  Severity of Illness: The appropriate patient status for this patient is INPATIENT. Inpatient status is judged to be reasonable and necessary in order to provide the required intensity of service to ensure the patient's safety. The patient's presenting symptoms, physical exam findings, and initial radiographic and laboratory data in the context of their chronic comorbidities is felt to place them at high risk for further clinical deterioration. Furthermore, it is not anticipated that the patient will be medically stable for discharge from the hospital within 2 midnights of admission.   * I certify that at the point of admission it is my clinical judgment that the patient will require inpatient hospital care spanning beyond 2 midnights from the point of admission due to high intensity of service, high risk for further deterioration and high frequency of surveillance required.*  Author: Orene Desanctis, DO 04/29/2022 9:30 PM  For on call review [email or web address].

## 2022-04-30 ENCOUNTER — Encounter (HOSPITAL_COMMUNITY): Payer: Self-pay | Admitting: Family Medicine

## 2022-04-30 ENCOUNTER — Inpatient Hospital Stay (HOSPITAL_COMMUNITY): Payer: Medicare Other

## 2022-04-30 DIAGNOSIS — B9629 Other Escherichia coli [E. coli] as the cause of diseases classified elsewhere: Secondary | ICD-10-CM | POA: Diagnosis not present

## 2022-04-30 DIAGNOSIS — Z1612 Extended spectrum beta lactamase (ESBL) resistance: Secondary | ICD-10-CM | POA: Diagnosis not present

## 2022-04-30 DIAGNOSIS — G20 Parkinson's disease: Secondary | ICD-10-CM | POA: Diagnosis not present

## 2022-04-30 DIAGNOSIS — N390 Urinary tract infection, site not specified: Secondary | ICD-10-CM | POA: Diagnosis not present

## 2022-04-30 LAB — CBC
HCT: 36.5 % — ABNORMAL LOW (ref 39.0–52.0)
Hemoglobin: 12.2 g/dL — ABNORMAL LOW (ref 13.0–17.0)
MCH: 30.3 pg (ref 26.0–34.0)
MCHC: 33.4 g/dL (ref 30.0–36.0)
MCV: 90.6 fL (ref 80.0–100.0)
Platelets: 222 10*3/uL (ref 150–400)
RBC: 4.03 MIL/uL — ABNORMAL LOW (ref 4.22–5.81)
RDW: 13.9 % (ref 11.5–15.5)
WBC: 10.1 10*3/uL (ref 4.0–10.5)
nRBC: 0 % (ref 0.0–0.2)

## 2022-04-30 LAB — GLUCOSE, CAPILLARY
Glucose-Capillary: 169 mg/dL — ABNORMAL HIGH (ref 70–99)
Glucose-Capillary: 200 mg/dL — ABNORMAL HIGH (ref 70–99)

## 2022-04-30 LAB — CBG MONITORING, ED
Glucose-Capillary: 90 mg/dL (ref 70–99)
Glucose-Capillary: 161 mg/dL — ABNORMAL HIGH (ref 70–99)

## 2022-04-30 MED ORDER — ONDANSETRON HCL 4 MG/2ML IJ SOLN
4.0000 mg | Freq: Once | INTRAMUSCULAR | Status: AC
Start: 2022-04-30 — End: 2022-04-30
  Administered 2022-04-30: 4 mg via INTRAVENOUS
  Filled 2022-04-30: qty 2

## 2022-04-30 NOTE — ED Notes (Addendum)
Straight sticked for recollect of CBC due to PIV's not pulling back sufficient blood for lab.

## 2022-04-30 NOTE — Evaluation (Signed)
Clinical/Bedside Swallow Evaluation Patient Details  [name] MRN: [id number] Date of Birth: 1946/03/29  Today's Date: 04/30/2022 Time: SLP Start Time (ACUTE ONLY): 1157 SLP Stop Time (ACUTE ONLY): 1213 SLP Time Calculation (min) (ACUTE ONLY): 16 min  Past Medical History:  Past Medical History:  Diagnosis Date   Abnormal involuntary movement 02/18/2018   Allergic rhinitis    Arthritis    BPH (benign prostatic hyperplasia)    Diabetes ([place])    Dysphagia, pharyngeal phase    GERD diagnosed on barium swallow. Has small hiatal hernia. Symptomatically somewhat better on omeprazole but not entirely. We'll try b.i.d. therapy   Edema    1+ in both ankles, likely multifactorial including medication such as Requip   Erectile dysfunction    Staxyn 10 mg or Viagra worked well. 3 samples of Cialis 20 mg provided   GERD (gastroesophageal reflux disease)    Hypercholesteremia    Hypertension    Nephrolithiasis    Onychomycosis of toenail    April 27, 2013 - Dr. Inocencio Homes - podiatry, was in [place] - treating with oral Lamisil and topical nail therapy   Parkinson's disease [facility])     followed by Dr. Lezlie Octave at [facility] and [name], M.D. in [place]   Presbycusis    and tinnitus - Dr. Izora Gala - August/2013   Renal calculus    Syncope    Past Surgical History:  Past Surgical History:  Procedure Laterality Date   HAND SURGERY     INGUINAL HERNIA REPAIR Left 11/04/2015   Procedure: LEFT INGUINAL HERNIA REPAIR WITH MESH;  Surgeon: Armandina Gemma, MD;  Location: [place];  Service: General;  Laterality: Left;   INSERTION OF MESH Left 11/04/2015   Procedure: INSERTION OF MESH;  Surgeon: Armandina Gemma, MD;  Location: [place];  Service: General;  Laterality: Left;   JOINT REPLACEMENT Bilateral    KNEE SURGERY     TOTAL KNEE ARTHROPLASTY     HPI:  [name] is a 76 y.o. male with medical history significant of Parkinson's disease,insulin  dependent type 2 diabetes, hypertension, chronic urinary retention, recurrent ESBL UTI who presents with symptoms of general malaise and progressive weakness. Per chart last admitted on 04/20/2022 - 04/22/2022 for progressive malaise. Per chart he has not been taking oral intake well due to dysphagia in the past week. Chest x-ray was negative. Found to have UTI.    Assessment / Plan / Recommendation  Clinical Impression  Pt and wife report coughing and difficulty taking pills with thin liquids here in the hospital and "sometimes at home".He has intact dentition and labial/lingual ROM and strength are adequate. Respiratory/swallow reciprocity was without difficulty; no signs of aspiration and orally/pharyngeally managed solids. He has higher opportunity for oropharyngeal dysphagia with history of Parkinson's and recommend pills be taken whole in puree which pt and wife were in agreement with. Continue regular texture, thin liquids, pills in puree, sit upright and no further ST needed at present. SLP Visit Diagnosis: Dysphagia, unspecified (R13.10)    Aspiration Risk  Mild aspiration risk    Diet Recommendation Regular;Thin liquid   Liquid Administration via: Straw;Cup Medication Administration: Whole meds with puree Supervision: Patient able to self feed Compensations: Slow rate;Small sips/bites Postural Changes: Seated upright at 90 degrees    Other  Recommendations Oral Care Recommendations: Oral care BID    Recommendations for follow up therapy are one component of a multi-disciplinary discharge planning process, led by the attending  physician.  Recommendations may be updated based on patient status, additional functional criteria and insurance authorization.  Follow up Recommendations No SLP follow up      Assistance Recommended at Discharge None  Functional Status Assessment    Frequency and Duration            Prognosis        Swallow Study   General Date of Onset:  04/29/22 HPI: [name] is a 76 y.o. male with medical history significant of Parkinson's disease,insulin dependent type 2 diabetes, hypertension, chronic urinary retention, recurrent ESBL UTI who presents with symptoms of general malaise and progressive weakness. Per chart last admitted on 04/20/2022 - 04/22/2022 for progressive malaise. Per chart he has not been taking oral intake well due to dysphagia in the past week. Chest x-ray was negative. Found to have UTI. Type of Study: Bedside Swallow Evaluation Previous Swallow Assessment:  (no) Diet Prior to this Study: Regular;Thin liquids Temperature Spikes Noted: No Respiratory Status: Room air History of Recent Intubation: No Behavior/Cognition: Alert;Cooperative;Pleasant mood Oral Cavity Assessment: Within Functional Limits Oral Care Completed by SLP: No Oral Cavity - Dentition: Adequate natural dentition Vision: Functional for self-feeding Self-Feeding Abilities: Able to feed self Patient Positioning: Upright in bed Baseline Vocal Quality: Normal Volitional Cough: Strong Volitional Swallow: Able to elicit    Oral/Motor/Sensory Function Overall Oral Motor/Sensory Function: Within functional limits   Ice Chips Ice chips: Not tested   Thin Liquid Thin Liquid: Within functional limits Presentation: Cup;Straw    Nectar Thick Nectar Thick Liquid: Not tested   Honey Thick Honey Thick Liquid: Not tested   Puree Puree: Not tested   Solid     Solid: Within functional limits      Houston Siren 04/30/2022,12:43 PM

## 2022-04-30 NOTE — ED Notes (Signed)
Assumed pt care with RN oversight. Pt A&Ox4, respirations equal and unlabored.

## 2022-04-30 NOTE — Progress Notes (Addendum)
PROGRESS NOTE    [name]  ZOX:[id number] DOB: [birth year] DOA: 04/29/2022 PCP: Josetta Huddle, MD  Outpatient Specialists: neurology, ID, urology    Brief Narrative:   From admission h and p [name] is a 76 y.o. 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    He was last admitted on 04/20/2022 - 04/22/2022 for progressive malaise and was treated with IV meropenem for 2 days but was discontinued by ID due to absence of localizing symptoms of UTI, fever or leukocytosis.  Wife says since then he has not really recovered and has been weaker. He fell and had T and L spine fractures in June and has been recovering from that and using a walker. Now he is even weaknr. Also,has not been taking oral intake well due to dysphagia in the past week. She is frustrated because he has had recurrent hospitalized in March and in May for UTI.      He is followed by ID for recurrent ESBL UTI and on Macrobid prophylaxis.     In the ED, he was febrile to 101.77F, intermittently hypertensive and on room air.No leukocytosis.  CMP without any significant electrolyte abnormalities.  UA shows large leukocyte, positive nitrite and many bacteria.  Chest x-ray was negative.   Assessment & Plan:   Principal Problem:   UTI due to extended-spectrum beta lactamase (ESBL) producing Escherichia coli Active Problems:   Parkinson disease (HCC)   Diabetes (HCC)   BPH (benign prostatic hyperplasia)   HTN (hypertension)   Dysphagia  UTI due to extended-spectrum beta lactamase (ESBL) producing Escherichia coli Has been an ongoing recurrent issue. Admitted in March for the same and required erapenem for 2 weeks. Started of Macrobid prophylaxis since 5/16. Hospitalized again in May and was treated with  fosfomycin once a week. Here generalized weakness, change in urine color, and fever. Follows with ID Dr. Gale Journey. He had issue with urinary retention in the past requiring foley catheter and his recurrent UTI thought could be neurogenic from worsening Parkinson's disease. No apparent retention now, bladder scan this morning less than 300. Is followed by urology as outpt -Continue IV meropenem and follow urine cultures.  -consulting ID today Johnnye Sima) who will see - palliative consulted   Dysphagia -worsening over the past week. Could be worsen progression of Parkinson -speech evaluation here but will need close follow up with neurology outpt -no s/s aspiration   HTN (hypertension) Continue Lisinopril. Controlled   Diabetes ([place]) Euglycemic today - cont ssi   Parkinson disease ([place]) Worsening weakness likely due to current UTI -continue home Sinemet, entacapone and requip - PT/OT consults   DVT prophylaxis: lovenox Code Status: full Family Communication: wife updated @ bedside  Level of care: Telemetry Status is: Inpatient Remains inpatient appropriate because: ongoing w/u, need for IV abx    Consultants:  ID  Procedures: none  Antimicrobials:  meropenem    Subjective: Malaise. No cough or abd pain  Objective: Vitals:   04/30/22 0509 04/30/22 0509 04/30/22 0600 04/30/22 0630  BP:   (!) 115/54   Pulse:   73 69  Resp:   20 20  Temp: 98.9 F (37.2 C) 98.9 F (37.2 C)    TempSrc: Oral Oral    SpO2:   92% 91%  Weight:        Intake/Output Summary (  Last 24 hours) at 04/30/2022 0829 Last data filed at 04/30/2022 0422 Gross per 24 hour  Intake 1200 ml  Output 700 ml  Net 500 ml   Filed Weights   04/29/22 1629  Weight: 86.2 kg    Examination:  General exam: Appears calm and comfortable  Respiratory system: Clear to auscultation. Respiratory effort normal. Cardiovascular system: S1 & S2 heard, RRR. No JVD, murmurs, rubs, gallops or clicks. No pedal  edema. Gastrointestinal system: Abdomen is nondistended, soft and nontender. No organomegaly or masses felt. Normal bowel sounds heard. Central nervous system: Alert and oriented. No focal neurological deficits. Extremities: warm, no edema Skin: No rashes, lesions or ulcers Psychiatry: Judgement and insight appear normal. Flat affect    Data Reviewed: I have personally reviewed following labs and imaging studies  CBC: Recent Labs  Lab 04/29/22 1640 04/30/22 0440  WBC 9.8 10.1  NEUTROABS 8.1*  --   HGB 13.3 12.2*  HCT 40.8 36.5*  MCV 91.5 90.6  PLT 245 081   Basic Metabolic Panel: Recent Labs  Lab 04/29/22 1640  NA 137  K 4.5  CL 104  CO2 20*  GLUCOSE 207*  BUN 17  CREATININE 0.98  CALCIUM 8.9   GFR: Estimated Creatinine Clearance: 71.5 mL/min (by C-G formula based on SCr of 0.98 mg/dL). Liver Function Tests: Recent Labs  Lab 04/29/22 1640  AST 10*  ALT 5  ALKPHOS 89  BILITOT 0.6  PROT 6.8  ALBUMIN 3.3*   No results for input(s): "LIPASE", "AMYLASE" in the last 168 hours. No results for input(s): "AMMONIA" in the last 168 hours. Coagulation Profile: Recent Labs  Lab 04/29/22 1750  INR 1.0   Cardiac Enzymes: No results for input(s): "CKTOTAL", "CKMB", "CKMBINDEX", "TROPONINI" in the last 168 hours. BNP (last 3 results) No results for input(s): "PROBNP" in the last 8760 hours. HbA1C: No results for input(s): "HGBA1C" in the last 72 hours. CBG: Recent Labs  Lab 04/29/22 2238 04/30/22 0745  GLUCAP 126* 90   Lipid Profile: No results for input(s): "CHOL", "HDL", "LDLCALC", "TRIG", "CHOLHDL", "LDLDIRECT" in the last 72 hours. Thyroid Function Tests: No results for input(s): "TSH", "T4TOTAL", "FREET4", "T3FREE", "THYROIDAB" in the last 72 hours. Anemia Panel: No results for input(s): "VITAMINB12", "FOLATE", "FERRITIN", "TIBC", "IRON", "RETICCTPCT" in the last 72 hours. Urine analysis:    Component Value Date/Time   COLORURINE YELLOW 04/29/2022  1640   APPEARANCEUR HAZY (A) 04/29/2022 1640   LABSPEC 1.015 04/29/2022 1640   PHURINE 6.0 04/29/2022 1640   GLUCOSEU 150 (A) 04/29/2022 1640   HGBUR SMALL (A) 04/29/2022 1640   BILIRUBINUR NEGATIVE 04/29/2022 1640   KETONESUR 5 (A) 04/29/2022 1640   PROTEINUR 100 (A) 04/29/2022 1640   UROBILINOGEN 0.2 10/25/2009 1404   NITRITE POSITIVE (A) 04/29/2022 1640   LEUKOCYTESUR LARGE (A) 04/29/2022 1640   Sepsis Labs: '@LABRCNTIP'$ (procalcitonin:4,lacticidven:4)  ) Recent Results (from the past 240 hour(s))  Blood culture (routine x 2)     Status: None   Collection Time: 04/20/22  1:58 PM   Specimen: BLOOD  Result Value Ref Range Status   Specimen Description   Final    BLOOD SITE NOT SPECIFIED Performed at [facility], [place] [address], [place], [place] [address]    Special Requests   Final    BOTTLES DRAWN AEROBIC AND ANAEROBIC Blood Culture adequate volume Performed at [place] [address], [place], [place] [address]    Culture   Final    NO GROWTH 5 DAYS  Performed at [facility], [place] [address] [place], [place] [address]    Report Status 04/25/2022 FINAL  Final  Blood culture (routine x 2)     Status: None   Collection Time: 04/20/22  1:58 PM   Specimen: BLOOD  Result Value Ref Range Status   Specimen Description   Final    BLOOD SITE NOT SPECIFIED Performed at [place] [address] [place], [place] [address]    Special Requests   Final    BOTTLES DRAWN AEROBIC AND ANAEROBIC Blood Culture adequate volume Performed at [place] [address], [place], [place] [address]    Culture   Final    NO GROWTH 5 DAYS Performed at [facility], [place] [address], [place], [place] [address]    Report Status 04/25/2022 FINAL  Final  Urine Culture     Status: Abnormal   Collection Time: 04/20/22  7:19 PM   Specimen: Urine, Catheterized  Result Value Ref Range Status   Specimen  Description   Final    URINE, CATHETERIZED Performed at [place] [address], [place], [place] [address]    Special Requests   Final    NONE Performed at [facility], [place] [address] [place], [place] [address]    Culture (A)  Final    >=100,000 COLONIES/mL KLEBSIELLA PNEUMONIAE Confirmed Extended Spectrum Beta-Lactamase Producer (ESBL).  In bloodstream infections from ESBL organisms, carbapenems are preferred over piperacillin/tazobactam. They are shown to have a lower risk of mortality.    Report Status 04/23/2022 FINAL  Final   Organism ID, Bacteria KLEBSIELLA PNEUMONIAE (A)  Final      Susceptibility   Klebsiella pneumoniae - MIC*    AMPICILLIN >=32 RESISTANT Resistant     CEFAZOLIN >=64 RESISTANT Resistant     CEFEPIME >=32 RESISTANT Resistant     CEFTRIAXONE >=64 RESISTANT Resistant     CIPROFLOXACIN >=4 RESISTANT Resistant     GENTAMICIN >=16 RESISTANT Resistant     IMIPENEM 0.5 SENSITIVE Sensitive     NITROFURANTOIN 256 RESISTANT Resistant     TRIMETH/SULFA >=320 RESISTANT Resistant     AMPICILLIN/SULBACTAM >=32 RESISTANT Resistant     PIP/TAZO >=128 RESISTANT Resistant     * >=100,000 COLONIES/mL KLEBSIELLA PNEUMONIAE  Resp Panel by RT-PCR (Flu A&B, Covid) Anterior Nasal Swab     Status: None   Collection Time: 04/29/22  4:40 PM   Specimen: Anterior Nasal Swab  Result Value Ref Range Status   SARS Coronavirus 2 by RT PCR NEGATIVE NEGATIVE Final    Comment: (NOTE) SARS-CoV-2 target nucleic acids are NOT DETECTED.  The SARS-CoV-2 RNA is generally detectable in upper respiratory specimens during the acute phase of infection. The lowest concentration of SARS-CoV-2 viral copies this assay can detect is 138 copies/mL. A negative result does not preclude SARS-Cov-2 infection and should not be used as the sole basis for treatment or other patient management decisions. A negative result may occur with  improper specimen  collection/handling, submission of specimen other than nasopharyngeal swab, presence of viral mutation(s) within the areas targeted by this assay, and inadequate number of viral copies(<138 copies/mL). A negative result must be combined with clinical observations, patient history, and epidemiological information. The expected result is Negative.  Fact Sheet for Patients:  EntrepreneurPulse.com.au  Fact Sheet for Healthcare Providers:  IncredibleEmployment.be  This test is no t yet approved or cleared by the [place] FDA and  has been  authorized for detection and/or diagnosis of SARS-CoV-2 by FDA under an Emergency Use Authorization (EUA). This EUA will remain  in effect (meaning this test can be used) for the duration of the COVID-19 declaration under Section 564(b)(1) of the Act, 21 U.S.C.section 360bbb-3(b)(1), unless the authorization is terminated  or revoked sooner.       Influenza A by PCR NEGATIVE NEGATIVE Final   Influenza B by PCR NEGATIVE NEGATIVE Final    Comment: (NOTE) The Xpert Xpress SARS-CoV-2/FLU/RSV plus assay is intended as an aid in the diagnosis of influenza from Nasopharyngeal swab specimens and should not be used as a sole basis for treatment. Nasal washings and aspirates are unacceptable for Xpert Xpress SARS-CoV-2/FLU/RSV testing.  Fact Sheet for Patients: EntrepreneurPulse.com.au  Fact Sheet for Healthcare Providers: IncredibleEmployment.be  This test is not yet approved or cleared by the [place] FDA and has been authorized for detection and/or diagnosis of SARS-CoV-2 by FDA under an Emergency Use Authorization (EUA). This EUA will remain in effect (meaning this test can be used) for the duration of the COVID-19 declaration under Section 564(b)(1) of the Act, 21 U.S.C. section 360bbb-3(b)(1), unless the authorization is terminated or revoked.  Performed at [facility], [place] [address], [place], [place] [address]          Radiology Studies: DG Chest Port 1 View  Result Date: 04/29/2022 CLINICAL DATA:  Possible sepsis EXAM: PORTABLE CHEST 1 VIEW COMPARISON:  04/20/2022 FINDINGS: Transverse diameter of the heart is in the upper limits of normal. There are no signs of pulmonary edema or focal pulmonary consolidation. Left hemidiaphragm is elevated. There is no pleural effusion or pneumothorax. IMPRESSION: No active disease. Electronically Signed   By: Elmer Picker M.D.   On: 04/29/2022 17:27        Scheduled Meds:  carbidopa-levodopa  3 tablet Oral 5 times per day   cholecalciferol  2,000 Units Oral Daily   enoxaparin (LOVENOX) injection  40 mg Subcutaneous Q24H   entacapone  200 mg Oral 3 times per day   insulin aspart  0-9 Units Subcutaneous TID PC & HS   lisinopril  5 mg Oral Daily   methenamine  1,000 mg Oral BID   pantoprazole  40 mg Oral Daily   potassium chloride SA  10 mEq Oral BID   rOPINIRole  1.5 mg Oral QHS   rOPINIRole  3 mg Oral BID AC   tamsulosin  0.4 mg Oral BID   vitamin B-12  1,000 mcg Oral Daily   vitamin C  250 mg Oral BID   Continuous Infusions:  lactated ringers 150 mL/hr at 04/30/22 0519   meropenem (MERREM) IV Stopped (04/30/22 0408)     LOS: 1 day     Desma Maxim, MD [facility]   If 7PM-7AM, please contact night-coverage [email or web address] Password [facility] 04/30/2022, 8:29 AM

## 2022-04-30 NOTE — Consult Note (Signed)
[facility]    Date of Admission:  04/29/2022   Total days of antibiotics: 1 Merrem               Reason for Consult: Resistant UTI    Referring Provider: Tu   Assessment: Multidrug resistant K pneumo Parkinsons  Plan: Continue Merrem for now Await UCx   Comment- Very difficult situation of colonization, renal cysts, poor emp\tying which has resulted in repeated episodes.  Could consider longer IV course but given prev results I (and his family) are not encouraged.  [name] will fu in AM.   Thank you so much for this interesting consult,  Principal Problem:   UTI due to extended-spectrum beta lactamase (ESBL) producing Escherichia coli Active Problems:   Parkinson disease (HCC)   Diabetes ([place])   BPH (benign prostatic hyperplasia)   HTN (hypertension)   Dysphagia    carbidopa-levodopa  3 tablet Oral 5 times per day   cholecalciferol  2,000 Units Oral Daily   enoxaparin (LOVENOX) injection  40 mg Subcutaneous Q24H   entacapone  200 mg Oral 3 times per day   insulin aspart  0-9 Units Subcutaneous TID PC & HS   lisinopril  5 mg Oral Daily   methenamine  1,000 mg Oral BID   pantoprazole  40 mg Oral Daily   potassium chloride SA  10 mEq Oral BID   rOPINIRole  1.5 mg Oral QHS   rOPINIRole  3 mg Oral BID AC   tamsulosin  0.4 mg Oral BID   vitamin B-12  1,000 mcg Oral Daily   vitamin C  250 mg Oral BID    HPI: [name] is a 76 y.o. male with hx of parkinson's disease who has had recurrent UTI's. [name] have grown K pneumo which are sensitive to carbapenems only. He had a trial of outpt nitrofurnatoin (biw) but did not improve (7-6).  He was adm 7-7 to 7-9 and was treated with 2 days of merrem.  He then had f/u with ID 7-12 explaining to pt difference between colonization and infection.  He returns  7-16 with worsening weakness, decreased PO, and with fever in ED of 101.4. His WBC was normal.  He was started on merrem in  ED.   Per wife he has been dealing with this for a year. UTI causes worsening parkinsonism.  Was treated with long term po and IV in March and then sx returned at completion.   Review of Systems: Review of Systems  Constitutional:  Positive for chills, fever and malaise/fatigue.  Respiratory:  Positive for cough.   Genitourinary:  Negative for dysuria, flank pain and hematuria.    Past Medical History:  Diagnosis Date   Abnormal involuntary movement 02/18/2018   Allergic rhinitis    Arthritis    BPH (benign prostatic hyperplasia)    Diabetes ([place])    Dysphagia, pharyngeal phase    GERD diagnosed on barium swallow. Has small hiatal hernia. Symptomatically somewhat better on omeprazole but not entirely. We'll try b.i.d. therapy   Edema    1+ in both ankles, likely multifactorial including medication such as Requip   Erectile dysfunction    Staxyn 10 mg or Viagra worked well. 3 samples of Cialis 20 mg provided   GERD (gastroesophageal reflux disease)    Hypercholesteremia    Hypertension    Nephrolithiasis    Onychomycosis of toenail    April 27, 2013 - Dr. Inocencio Homes - podiatry, was in [place] - treating with oral Lamisil and topical nail therapy   Parkinson's disease [facility])     followed by Dr. Lezlie Octave at [facility] and [name], M.D. in [facility]   Presbycusis    and tinnitus - Dr. Izora Gala - August/2013   Renal calculus    Syncope     Social History   Tobacco Use   Smoking status: Never   Smokeless tobacco: Never  Vaping Use   Vaping Use: Never used  Substance Use Topics   Alcohol use: No   Drug use: No    Family History  Problem Relation Age of Onset   Atrial fibrillation Mother    Breast cancer Mother    Emphysema Father    Heart disease Father    Cancer Brother        African [place]     Medications: Scheduled:  carbidopa-levodopa  3 tablet Oral 5 times per day   cholecalciferol  2,000 Units Oral Daily   enoxaparin (LOVENOX) injection  40 mg  Subcutaneous Q24H   entacapone  200 mg Oral 3 times per day   insulin aspart  0-9 Units Subcutaneous TID PC & HS   lisinopril  5 mg Oral Daily   methenamine  1,000 mg Oral BID   pantoprazole  40 mg Oral Daily   potassium chloride SA  10 mEq Oral BID   rOPINIRole  1.5 mg Oral QHS   rOPINIRole  3 mg Oral BID AC   tamsulosin  0.4 mg Oral BID   vitamin B-12  1,000 mcg Oral Daily   vitamin C  250 mg Oral BID    Abtx:  Anti-infectives (From admission, onward)    Start     Dose/Rate Route Frequency Ordered Stop   04/30/22 0200  meropenem (MERREM) 1 g in sodium chloride 0.9 % 100 mL IVPB        1 g 200 mL/hr over 30 Minutes Intravenous Every 8 hours 04/29/22 2014     04/29/22 2200  methenamine (MANDELAMINE) tablet 1,000 mg       Note to Pharmacy: Take with any vitamin c, twice a day for uti prevention     1,000 mg Oral 2 times daily 04/29/22 2116     04/29/22 1645  cefTRIAXone (ROCEPHIN) 2 g in sodium chloride 0.9 % 100 mL IVPB  Status:  Discontinued        2 g 200 mL/hr over 30 Minutes Intravenous Every 24 hours 04/29/22 1640 04/29/22 1642   04/29/22 1645  meropenem (MERREM) 1 g in sodium chloride 0.9 % 100 mL IVPB        1 g 200 mL/hr over 30 Minutes Intravenous  Once 04/29/22 1644 04/29/22 1845         OBJECTIVE: Blood pressure 127/64, pulse 94, temperature 98.5 F (36.9 C), temperature source Oral, resp. rate 17, weight 86.2 kg, SpO2 98 %.  Physical Exam Vitals reviewed.  Constitutional:      Appearance: He is not ill-appearing or diaphoretic.  HENT:     Mouth/Throat:     Mouth: Mucous membranes are moist.     Pharynx: No oropharyngeal exudate.  Eyes:     Extraocular Movements: Extraocular movements intact.     Pupils: Pupils are equal, round, and reactive to light.  Cardiovascular:     Rate and Rhythm: Normal rate and regular rhythm.  Pulmonary:     Effort: Pulmonary effort is normal.  Breath sounds: Normal breath sounds.  Abdominal:     General: Bowel sounds  are normal. There is no distension.     Palpations: Abdomen is soft.     Tenderness: There is no abdominal tenderness. There is no right CVA tenderness or left CVA tenderness.  Musculoskeletal:     Cervical back: Normal range of motion and neck supple.     Right lower leg: No edema.     Left lower leg: No edema.  Neurological:     Mental Status: He is alert.     Lab Results Results for orders placed or performed during the hospital encounter of 04/29/22 (from the past 48 hour(s))  Blood Culture (routine x 2)     Status: None (Preliminary result)   Collection Time: 04/29/22  4:37 PM   Specimen: BLOOD  Result Value Ref Range   Specimen Description      BLOOD BLOOD LEFT HAND Performed at [facility] [address] [place], [place] [address]    Special Requests      BOTTLES DRAWN AEROBIC ONLY Blood Culture results may not be optimal due to an inadequate volume of blood received in culture bottles Performed at [facility] [address] [place], [place] [address]    Culture      NO GROWTH < 24 HOURS Performed at [facility] [address], [place],  [address]    Report Status PENDING   Resp Panel by RT-PCR (Flu A&B, Covid) Anterior Nasal Swab     Status: None   Collection Time: 04/29/22  4:40 PM   Specimen: Anterior Nasal Swab  Result Value Ref Range   SARS Coronavirus 2 by RT PCR NEGATIVE NEGATIVE    Comment: (NOTE) SARS-CoV-2 target nucleic acids are NOT DETECTED.  The SARS-CoV-2 RNA is generally detectable in upper respiratory specimens during the acute phase of infection. The lowest concentration of SARS-CoV-2 viral copies this assay can detect is 138 copies/mL. A negative result does not preclude SARS-Cov-2 infection and should not be used as the sole basis for treatment or other patient management decisions. A negative result may occur with  improper specimen collection/handling, submission of specimen  other than nasopharyngeal swab, presence of viral mutation(s) within the areas targeted by this assay, and inadequate number of viral copies(<138 copies/mL). A negative result must be combined with clinical observations, patient history, and epidemiological information. The expected result is Negative.  Fact Sheet for Patients:  EntrepreneurPulse.com.au  Fact Sheet for Healthcare Providers:  IncredibleEmployment.be  This test is no t yet approved or cleared by the [place] FDA and  has been authorized for detection and/or diagnosis of SARS-CoV-2 by FDA under an Emergency Use Authorization (EUA). This EUA will remain  in effect (meaning this test can be used) for the duration of the COVID-19 declaration under Section 564(b)(1) of the Act, 21 U.S.C.section 360bbb-3(b)(1), unless the authorization is terminated  or revoked sooner.       Influenza A by PCR NEGATIVE NEGATIVE   Influenza B by PCR NEGATIVE NEGATIVE    Comment: (NOTE) The Xpert Xpress SARS-CoV-2/FLU/RSV plus assay is intended as an aid in the diagnosis of influenza from Nasopharyngeal swab specimens and should not be used as a sole basis for treatment. Nasal washings and aspirates are unacceptable for Xpert Xpress SARS-CoV-2/FLU/RSV testing.  Fact Sheet for Patients: EntrepreneurPulse.com.au  Fact Sheet for Healthcare Providers: IncredibleEmployment.be  This test is not yet approved or cleared by the [place]  FDA and has been authorized for detection and/or diagnosis of SARS-CoV-2 by FDA under an Emergency Use Authorization (EUA). This EUA will remain in effect (meaning this test can be used) for the duration of the COVID-19 declaration under Section 564(b)(1) of the Act, 21 U.S.C. section 360bbb-3(b)(1), unless the authorization is terminated or revoked.  Performed at [facility], [place] [address], [place], [name] [address]   Lactic acid, plasma     Status: None   Collection Time: 04/29/22  4:40 PM  Result Value Ref Range   Lactic Acid, Venous 1.7 0.5 - 1.9 mmol/L    Comment: Performed at [facility], [place] [address], [place], [place] [address]  Comprehensive metabolic panel     Status: Abnormal   Collection Time: 04/29/22  4:40 PM  Result Value Ref Range   Sodium 137 135 - 145 mmol/L   Potassium 4.5 3.5 - 5.1 mmol/L   Chloride 104 98 - 111 mmol/L   CO2 20 (L) 22 - 32 mmol/L   Glucose, Bld 207 (H) 70 - 99 mg/dL    Comment: Glucose reference range applies only to samples taken after fasting for at least 8 hours.   BUN 17 8 - 23 mg/dL   Creatinine, Ser 0.98 0.61 - 1.24 mg/dL   Calcium 8.9 8.9 - 10.3 mg/dL   Total Protein 6.8 6.5 - 8.1 g/dL   Albumin 3.3 (L) 3.5 - 5.0 g/dL   AST 10 (L) 15 - 41 U/L   ALT 5 0 - 44 U/L   Alkaline Phosphatase 89 38 - 126 U/L   Total Bilirubin 0.6 0.3 - 1.2 mg/dL   GFR, Estimated >60 >60 mL/min    Comment: (NOTE) Calculated using the CKD-EPI Creatinine Equation (2021)    Anion gap 13 5 - 15    Comment: Performed at [facility], [place] [address], [place], [place] [address]  CBC with Differential     Status: Abnormal   Collection Time: 04/29/22  4:40 PM  Result Value Ref Range   WBC 9.8 4.0 - 10.5 K/uL   RBC 4.46 4.22 - 5.81 MIL/uL   Hemoglobin 13.3 13.0 - 17.0 g/dL   HCT 40.8 39.0 - 52.0 %   MCV 91.5 80.0 - 100.0 fL   MCH 29.8 26.0 - 34.0 pg   MCHC 32.6 30.0 - 36.0 g/dL   RDW 14.0 11.5 - 15.5 %   Platelets 245 150 - 400 K/uL   nRBC 0.0 0.0 - 0.2 %   Neutrophils Relative % 82 %   Neutro Abs 8.1 (H) 1.7 - 7.7 K/uL   Lymphocytes Relative 7 %   Lymphs Abs 0.7 0.7 - 4.0 K/uL   Monocytes Relative 10 %   Monocytes Absolute 1.0 0.1 - 1.0 K/uL   Eosinophils Relative 1 %   Eosinophils Absolute 0.1 0.0 - 0.5 K/uL   Basophils Relative 0 %   Basophils Absolute 0.0 0.0 - 0.1 K/uL   Immature Granulocytes 0 %    Abs Immature Granulocytes 0.03 0.00 - 0.07 K/uL    Comment: Performed at [facility], [place] [address] [place], [place] [address]  Urinalysis, Routine w reflex microscopic Urine, Clean Catch     Status: Abnormal   Collection Time: 04/29/22  4:40 PM  Result Value Ref Range   Color, Urine YELLOW YELLOW   APPearance HAZY (A) CLEAR   Specific Gravity, Urine 1.015 1.005 - 1.030   pH 6.0 5.0 - 8.0  Glucose, UA 150 (A) NEGATIVE mg/dL   Hgb urine dipstick SMALL (A) NEGATIVE   Bilirubin Urine NEGATIVE NEGATIVE   Ketones, ur 5 (A) NEGATIVE mg/dL   Protein, ur 100 (A) NEGATIVE mg/dL   Nitrite POSITIVE (A) NEGATIVE   Leukocytes,Ua LARGE (A) NEGATIVE   RBC / HPF 6-10 0 - 5 RBC/hpf   WBC, UA >50 (H) 0 - 5 WBC/hpf   Bacteria, UA MANY (A) NONE SEEN   WBC Clumps PRESENT    Mucus PRESENT     Comment: Performed at [facility], [place] [address], [place], [place] [address]  Protime-INR     Status: None   Collection Time: 04/29/22  5:50 PM  Result Value Ref Range   Prothrombin Time 13.4 11.4 - 15.2 seconds   INR 1.0 0.8 - 1.2    Comment: (NOTE) INR goal varies based on device and disease states. Performed at [facility], [place] [address], [place], [place] [address]   APTT     Status: None   Collection Time: 04/29/22  5:50 PM  Result Value Ref Range   aPTT 24 24 - 36 seconds    Comment: Performed at [facility], [place] [address] [place], [name] [address]  Lactic acid, plasma     Status: None   Collection Time: 04/29/22  5:52 PM  Result Value Ref Range   Lactic Acid, Venous 1.2 0.5 - 1.9 mmol/L    Comment: Performed at [facility], [place] [address] [place], [place] [address]  Blood Culture (routine x 2)     Status: None (Preliminary result)   Collection Time: 04/29/22  6:00 PM   Specimen: BLOOD  Result Value Ref Range   Specimen Description      BLOOD LEFT ANTECUBITAL Performed at [facility] [address], [place], [place] [address]    Special Requests      BOTTLES DRAWN AEROBIC AND ANAEROBIC Blood Culture adequate volume Performed at [facility] [address], [place], [place] [address]    Culture      NO GROWTH < 12 HOURS Performed at [facility] [address] [place], [place] [address]    Report Status PENDING   CBG monitoring, ED     Status: Abnormal   Collection Time: 04/29/22 10:38 PM  Result Value Ref Range   Glucose-Capillary 126 (H) 70 - 99 mg/dL    Comment: Glucose reference range applies only to samples taken after fasting for at least 8 hours.  CBC     Status: Abnormal   Collection Time: 04/30/22  4:40 AM  Result Value Ref Range   WBC 10.1 4.0 - 10.5 K/uL   RBC 4.03 (L) 4.22 - 5.81 MIL/uL   Hemoglobin 12.2 (L) 13.0 - 17.0 g/dL   HCT 36.5 (L) 39.0 - 52.0 %   MCV 90.6 80.0 - 100.0 fL   MCH 30.3 26.0 - 34.0 pg   MCHC 33.4 30.0 - 36.0 g/dL   RDW 13.9 11.5 - 15.5 %   Platelets 222 150 - 400 K/uL   nRBC 0.0 0.0 - 0.2 %    Comment: Performed at [facility], [place] [address], [place],  [address]  CBG monitoring, ED     Status: None   Collection Time: 04/30/22  7:45 AM  Result Value Ref Range   Glucose-Capillary 90 70 - 99 mg/dL    Comment: Glucose reference range applies only to  samples taken after fasting for at least 8 hours.  CBG monitoring, ED     Status: Abnormal   Collection Time: 04/30/22 12:03 PM  Result Value Ref Range   Glucose-Capillary 161 (H) 70 - 99 mg/dL    Comment: Glucose reference range applies only to samples taken after fasting for at least 8 hours.      Component Value Date/Time   SDES  04/29/2022 1800    BLOOD LEFT ANTECUBITAL Performed at [facility], [place] [address], [place], [place] [address]    SPECREQUEST  04/29/2022 1800    BOTTLES DRAWN AEROBIC AND ANAEROBIC Blood Culture adequate volume Performed at [facility] [address] [place], [place] [address]    CULT  04/29/2022 1800    NO GROWTH < 12 HOURS Performed at [facility] [address], [place], [place] [address]    REPTSTATUS PENDING 04/29/2022 1800   DG Chest Port 1 View  Result Date: 04/29/2022 CLINICAL DATA:  Possible sepsis EXAM: PORTABLE CHEST 1 VIEW COMPARISON:  04/20/2022 FINDINGS: Transverse diameter of the heart is in the upper limits of normal. There are no signs of pulmonary edema or focal pulmonary consolidation. Left hemidiaphragm is elevated. There is no pleural effusion or pneumothorax. IMPRESSION: No active disease. Electronically Signed   By: Elmer Picker M.D.   On: 04/29/2022 17:27   Recent Results (from the past 240 hour(s))  Urine Culture     Status: Abnormal   Collection Time: 04/20/22  7:19 PM   Specimen: Urine, Catheterized  Result Value Ref Range Status   Specimen Description   Final    URINE, CATHETERIZED Performed at [facility] [address] [place], [place] [address]    Special Requests   Final    NONE Performed at [facility], [place] [address], [place], [place] [address]    Culture (A)  Final    >=100,000 COLONIES/mL KLEBSIELLA PNEUMONIAE Confirmed Extended Spectrum Beta-Lactamase Producer (ESBL).  In bloodstream infections from ESBL organisms, carbapenems are preferred over piperacillin/tazobactam. They are shown to have a lower risk of mortality.    Report Status 04/23/2022 FINAL  Final   Organism ID, Bacteria KLEBSIELLA PNEUMONIAE (A)  Final      Susceptibility   Klebsiella pneumoniae - MIC*    AMPICILLIN >=32 RESISTANT Resistant     CEFAZOLIN >=64 RESISTANT Resistant     CEFEPIME >=32 RESISTANT Resistant     CEFTRIAXONE >=64 RESISTANT Resistant     CIPROFLOXACIN >=4 RESISTANT Resistant     GENTAMICIN >=16 RESISTANT Resistant     IMIPENEM 0.5 SENSITIVE Sensitive     NITROFURANTOIN 256 RESISTANT Resistant     TRIMETH/SULFA >=320  RESISTANT Resistant     AMPICILLIN/SULBACTAM >=32 RESISTANT Resistant     PIP/TAZO >=128 RESISTANT Resistant     * >=100,000 COLONIES/mL KLEBSIELLA PNEUMONIAE  Blood Culture (routine x 2)     Status: None (Preliminary result)   Collection Time: 04/29/22  4:37 PM   Specimen: BLOOD  Result Value Ref Range Status   Specimen Description   Final    BLOOD BLOOD LEFT HAND Performed at [facility] [address] [place], [place] [address]    Special Requests   Final    BOTTLES DRAWN AEROBIC ONLY Blood Culture results may not be optimal due to an inadequate volume of blood received in culture bottles Performed at [facility] [address], [place],  [address]  Culture   Final    NO GROWTH < 24 HOURS Performed at [facility], [place] [address], [place], [place] [address]    Report Status PENDING  Incomplete  Resp Panel by RT-PCR (Flu A&B, Covid) Anterior Nasal Swab     Status: None   Collection Time: 04/29/22  4:40 PM   Specimen: Anterior Nasal Swab  Result Value Ref Range Status   SARS Coronavirus 2 by RT PCR NEGATIVE NEGATIVE Final    Comment: (NOTE) SARS-CoV-2 target nucleic acids are NOT DETECTED.  The SARS-CoV-2 RNA is generally detectable in upper respiratory specimens during the acute phase of infection. The lowest concentration of SARS-CoV-2 viral copies this assay can detect is 138 copies/mL. A negative result does not preclude SARS-Cov-2 infection and should not be used as the sole basis for treatment or other patient management decisions. A negative result may occur with  improper specimen collection/handling, submission of specimen other than nasopharyngeal swab, presence of viral mutation(s) within the areas targeted by this assay, and inadequate number of viral copies(<138 copies/mL). A negative result must be combined with clinical observations, patient history, and epidemiological information. The expected result  is Negative.  Fact Sheet for Patients:  EntrepreneurPulse.com.au  Fact Sheet for Healthcare Providers:  IncredibleEmployment.be  This test is no t yet approved or cleared by the [place] FDA and  has been authorized for detection and/or diagnosis of SARS-CoV-2 by FDA under an Emergency Use Authorization (EUA). This EUA will remain  in effect (meaning this test can be used) for the duration of the COVID-19 declaration under Section 564(b)(1) of the Act, 21 U.S.C.section 360bbb-3(b)(1), unless the authorization is terminated  or revoked sooner.       Influenza A by PCR NEGATIVE NEGATIVE Final   Influenza B by PCR NEGATIVE NEGATIVE Final    Comment: (NOTE) The Xpert Xpress SARS-CoV-2/FLU/RSV plus assay is intended as an aid in the diagnosis of influenza from Nasopharyngeal swab specimens and should not be used as a sole basis for treatment. Nasal washings and aspirates are unacceptable for Xpert Xpress SARS-CoV-2/FLU/RSV testing.  Fact Sheet for Patients: EntrepreneurPulse.com.au  Fact Sheet for Healthcare Providers: IncredibleEmployment.be  This test is not yet approved or cleared by the [place] FDA and has been authorized for detection and/or diagnosis of SARS-CoV-2 by FDA under an Emergency Use Authorization (EUA). This EUA will remain in effect (meaning this test can be used) for the duration of the COVID-19 declaration under Section 564(b)(1) of the Act, 21 U.S.C. section 360bbb-3(b)(1), unless the authorization is terminated or revoked.  Performed at [facility], [place] [address], [place], [place] [address]   Blood Culture (routine x 2)     Status: None (Preliminary result)   Collection Time: 04/29/22  6:00 PM   Specimen: BLOOD  Result Value Ref Range Status   Specimen Description   Final    BLOOD LEFT ANTECUBITAL Performed at [facility]  [address], [place], [place] [address]    Special Requests   Final    BOTTLES DRAWN AEROBIC AND ANAEROBIC Blood Culture adequate volume Performed at [facility] [address], [place], [place] [address]    Culture   Final    NO GROWTH < 12 HOURS Performed at [facility] [address] [place], [place] [address]    Report Status PENDING  Incomplete    Microbiology: Recent Results (from the past 240 hour(s))  Urine Culture  Status: Abnormal   Collection Time: 04/20/22  7:19 PM   Specimen: Urine, Catheterized  Result Value Ref Range Status   Specimen Description   Final    URINE, CATHETERIZED Performed at [facility] [address] [place], [place] [address]    Special Requests   Final    NONE Performed at [facility], [place] [address], [place], [place] [address]    Culture (A)  Final    >=100,000 COLONIES/mL KLEBSIELLA PNEUMONIAE Confirmed Extended Spectrum Beta-Lactamase Producer (ESBL).  In bloodstream infections from ESBL organisms, carbapenems are preferred over piperacillin/tazobactam. They are shown to have a lower risk of mortality.    Report Status 04/23/2022 FINAL  Final   Organism ID, Bacteria KLEBSIELLA PNEUMONIAE (A)  Final      Susceptibility   Klebsiella pneumoniae - MIC*    AMPICILLIN >=32 RESISTANT Resistant     CEFAZOLIN >=64 RESISTANT Resistant     CEFEPIME >=32 RESISTANT Resistant     CEFTRIAXONE >=64 RESISTANT Resistant     CIPROFLOXACIN >=4 RESISTANT Resistant     GENTAMICIN >=16 RESISTANT Resistant     IMIPENEM 0.5 SENSITIVE Sensitive     NITROFURANTOIN 256 RESISTANT Resistant     TRIMETH/SULFA >=320 RESISTANT Resistant     AMPICILLIN/SULBACTAM >=32 RESISTANT Resistant     PIP/TAZO >=128 RESISTANT Resistant     * >=100,000 COLONIES/mL KLEBSIELLA PNEUMONIAE  Blood Culture (routine x 2)     Status: None (Preliminary result)   Collection Time: 04/29/22  4:37 PM   Specimen: BLOOD   Result Value Ref Range Status   Specimen Description   Final    BLOOD BLOOD LEFT HAND Performed at [facility] [address], [place], [place] [address]    Special Requests   Final    BOTTLES DRAWN AEROBIC ONLY Blood Culture results may not be optimal due to an inadequate volume of blood received in culture bottles Performed at [facility] [address] [place], [place] [address]    Culture   Final    NO GROWTH < 24 HOURS Performed at [facility] [address], [place], [place] [address]    Report Status PENDING  Incomplete  Resp Panel by RT-PCR (Flu A&B, Covid) Anterior Nasal Swab     Status: None   Collection Time: 04/29/22  4:40 PM   Specimen: Anterior Nasal Swab  Result Value Ref Range Status   SARS Coronavirus 2 by RT PCR NEGATIVE NEGATIVE Final    Comment: (NOTE) SARS-CoV-2 target nucleic acids are NOT DETECTED.  The SARS-CoV-2 RNA is generally detectable in upper respiratory specimens during the acute phase of infection. The lowest concentration of SARS-CoV-2 viral copies this assay can detect is 138 copies/mL. A negative result does not preclude SARS-Cov-2 infection and should not be used as the sole basis for treatment or other patient management decisions. A negative result may occur with  improper specimen collection/handling, submission of specimen other than nasopharyngeal swab, presence of viral mutation(s) within the areas targeted by this assay, and inadequate number of viral copies(<138 copies/mL). A negative result must be combined with clinical observations, patient history, and epidemiological information. The expected result is Negative.  Fact Sheet for Patients:  EntrepreneurPulse.com.au  Fact Sheet for Healthcare Providers:  IncredibleEmployment.be  This test is no t yet approved or cleared by the [place] FDA and  has been authorized for detection and/or  diagnosis of SARS-CoV-2 by FDA under an Emergency Use Authorization (  EUA). This EUA will remain  in effect (meaning this test can be used) for the duration of the COVID-19 declaration under Section 564(b)(1) of the Act, 21 U.S.C.section 360bbb-3(b)(1), unless the authorization is terminated  or revoked sooner.       Influenza A by PCR NEGATIVE NEGATIVE Final   Influenza B by PCR NEGATIVE NEGATIVE Final    Comment: (NOTE) The Xpert Xpress SARS-CoV-2/FLU/RSV plus assay is intended as an aid in the diagnosis of influenza from Nasopharyngeal swab specimens and should not be used as a sole basis for treatment. Nasal washings and aspirates are unacceptable for Xpert Xpress SARS-CoV-2/FLU/RSV testing.  Fact Sheet for Patients: EntrepreneurPulse.com.au  Fact Sheet for Healthcare Providers: IncredibleEmployment.be  This test is not yet approved or cleared by the [place] FDA and has been authorized for detection and/or diagnosis of SARS-CoV-2 by FDA under an Emergency Use Authorization (EUA). This EUA will remain in effect (meaning this test can be used) for the duration of the COVID-19 declaration under Section 564(b)(1) of the Act, 21 U.S.C. section 360bbb-3(b)(1), unless the authorization is terminated or revoked.  Performed at [facility], [place] [address], [place], [place] [address]   Blood Culture (routine x 2)     Status: None (Preliminary result)   Collection Time: 04/29/22  6:00 PM   Specimen: BLOOD  Result Value Ref Range Status   Specimen Description   Final    BLOOD LEFT ANTECUBITAL Performed at [facility] [address], [place], [place] [address]    Special Requests   Final    BOTTLES DRAWN AEROBIC AND ANAEROBIC Blood Culture adequate volume Performed at [facility] [address], [place], [place] [address]    Culture   Final    NO GROWTH < 12 HOURS Performed at  [facility] [address], [place], [place] [address]    Report Status PENDING  Incomplete    Radiographs and labs were personally reviewed by me.   Bobby Rumpf, MD [facility] for Infectious [place] [facility] [phone] 04/30/2022, 3:24 PM

## 2022-05-01 ENCOUNTER — Inpatient Hospital Stay (HOSPITAL_COMMUNITY): Payer: Medicare Other

## 2022-05-01 ENCOUNTER — Telehealth: Payer: Self-pay | Admitting: Neurology

## 2022-05-01 DIAGNOSIS — N281 Cyst of kidney, acquired: Secondary | ICD-10-CM

## 2022-05-01 DIAGNOSIS — J810 Acute pulmonary edema: Secondary | ICD-10-CM

## 2022-05-01 DIAGNOSIS — Z1612 Extended spectrum beta lactamase (ESBL) resistance: Secondary | ICD-10-CM | POA: Diagnosis not present

## 2022-05-01 DIAGNOSIS — N400 Enlarged prostate without lower urinary tract symptoms: Secondary | ICD-10-CM

## 2022-05-01 DIAGNOSIS — N390 Urinary tract infection, site not specified: Secondary | ICD-10-CM | POA: Diagnosis not present

## 2022-05-01 DIAGNOSIS — G20 Parkinson's disease: Secondary | ICD-10-CM | POA: Diagnosis not present

## 2022-05-01 DIAGNOSIS — R531 Weakness: Secondary | ICD-10-CM

## 2022-05-01 DIAGNOSIS — B9629 Other Escherichia coli [E. coli] as the cause of diseases classified elsewhere: Secondary | ICD-10-CM | POA: Diagnosis not present

## 2022-05-01 LAB — BASIC METABOLIC PANEL
Anion gap: 7 (ref 5–15)
BUN: 16 mg/dL (ref 8–23)
CO2: 26 mmol/L (ref 22–32)
Calcium: 8.7 mg/dL — ABNORMAL LOW (ref 8.9–10.3)
Chloride: 101 mmol/L (ref 98–111)
Creatinine, Ser: 1.06 mg/dL (ref 0.61–1.24)
GFR, Estimated: >60 mL/min (ref >60)
Glucose, Bld: 153 mg/dL — ABNORMAL HIGH (ref 70–99)
Potassium: 4.3 mmol/L (ref 3.5–5.1)
Sodium: 134 mmol/L — ABNORMAL LOW (ref 135–145)

## 2022-05-01 LAB — GLUCOSE, CAPILLARY
Glucose-Capillary: 155 mg/dL — ABNORMAL HIGH (ref 70–99)
Glucose-Capillary: 144 mg/dL — ABNORMAL HIGH (ref 70–99)
Glucose-Capillary: 168 mg/dL — ABNORMAL HIGH (ref 70–99)
Glucose-Capillary: 200 mg/dL — ABNORMAL HIGH (ref 70–99)

## 2022-05-01 LAB — URINE CULTURE: Culture: >=100000 — AB

## 2022-05-01 MED ORDER — IOHEXOL 300 MG/ML  SOLN
100.0000 mL | Freq: Once | INTRAMUSCULAR | Status: AC | PRN
Start: 2022-05-01 — End: 2022-05-01
  Administered 2022-05-01: 100 mL via INTRAVENOUS

## 2022-05-01 MED ORDER — GUAIFENESIN-DM 100-10 MG/5ML PO SYRP
5.0000 mL | ORAL_SOLUTION | ORAL | Status: DC | PRN
  Administered 2022-05-01 – 2022-05-02 (×2): 5 mL via ORAL
  Filled 2022-05-01 (×3): qty 5

## 2022-05-01 MED ORDER — FUROSEMIDE 10 MG/ML IJ SOLN
20.0000 mg | Freq: Once | INTRAMUSCULAR | Status: AC
  Administered 2022-05-01: 20 mg via INTRAVENOUS
  Filled 2022-05-01: qty 2

## 2022-05-01 NOTE — Telephone Encounter (Signed)
Pt is in hospital has UTI, bacterial infection. ESPL. Just wanted tat to know.

## 2022-05-01 NOTE — TOC Initial Note (Signed)
Transition of Care [facility]) - Initial/Assessment Note    Patient Details  [name] MRN: [id number] Date of Birth: [birth year]  [facility]) CM/SW Contact:    Vassie Moselle, LCSW Phone Number: 05/01/2022, 2:40 PM  Clinical Narrative:                 Met with pt and confirmed plans for SNF placement. Pt states his top choice for SNF is [facility] in [place]. CSW sent referrals out for SNF placement and currently awaiting bed offers.   Expected Discharge Plan: Skilled Nursing Facility Barriers to Discharge: Continued Medical Work up   Patient Goals and CMS Choice Patient states their goals for this hospitalization and ongoing recovery are:: To go home   Choice offered to / list presented to : Patient  Expected Discharge Plan and Services Expected Discharge Plan: [place] In-house Referral: Clinical Social Work Discharge Planning Services: CM Consult Post Acute Care Choice: [place] Living arrangements for the past 2 months: Single Family Home                 DME Arranged: N/A DME Agency: NA                  Prior Living Arrangements/Services Living arrangements for the past 2 months: Single Family Home Lives with:: Spouse Patient language and need for interpreter reviewed:: Yes Do you feel safe going back to the place where you live?: Yes      Need for Family Participation in Patient Care: Yes (Comment) Care giver support system in place?: No (comment) Current home services: DME Criminal Activity/Legal Involvement Pertinent to Current Situation/Hospitalization: No - Comment as needed  Activities of Daily Living Home Assistive Devices/Equipment: Gilford Rile (specify type) ADL Screening (condition at time of admission) Patient's cognitive ability adequate to safely complete daily activities?: Yes Is the patient deaf or have difficulty hearing?: No Does the patient have difficulty seeing, even when wearing  glasses/contacts?: Yes Does the patient have difficulty concentrating, remembering, or making decisions?: No Patient able to express need for assistance with ADLs?: Yes Does the patient have difficulty dressing or bathing?: No Independently performs ADLs?: Yes (appropriate for developmental age) Does the patient have difficulty walking or climbing stairs?: Yes Weakness of Legs: Both Weakness of Arms/Hands: Both  Permission Sought/Granted Permission sought to share information with : Facility Sport and exercise psychologist, Family Supports Permission granted to share information with : Yes, Verbal Permission Granted  Share Information with NAME: Elzy Tomasello     Permission granted to share info w Relationship: Spouse  Permission granted to share info w Contact Information: [phone]  Emotional Assessment Appearance:: Appears stated age Attitude/Demeanor/Rapport: Engaged Affect (typically observed): Accepting Orientation: : Oriented to Self, Oriented to Place, Oriented to  Time, Oriented to Situation Alcohol / Substance Use: Not Applicable Psych Involvement: No (comment)  Admission diagnosis:  Acute cystitis without hematuria [N30.00] UTI due to extended-spectrum beta lactamase (ESBL) producing Escherichia coli [N39.0, B96.29, Z16.12] Sepsis, due to unspecified organism, unspecified whether acute organ dysfunction present [facility]) [A41.9] Patient Active Problem List   Diagnosis Date Noted   UTI due to extended-spectrum beta lactamase (ESBL) producing Escherichia coli 04/29/2022   Dysphagia 04/29/2022   AKI (acute kidney injury) ([place])    Dehydration    Failure to thrive (child)    Bacteriuria, asymptomatic    Failure to thrive in adult 04/20/2022   Malaise, possible ESBL Klebsiella UTI 04/20/2022   Chronic retention of urine 04/20/2022  Closed lumbar vertebral fracture ([place]) 04/20/2022   UTI (urinary tract infection) 03/01/2022   Leukocytosis 01/01/2022   Hyponatremia 01/01/2022   CKD  (chronic kidney disease), stage III ([place]) 01/01/2022   HTN (hypertension) 12/31/2021   History of ESBL Klebsiella pneumoniae infection 12/31/2021   Generalized weakness 12/31/2021   Chronic venous insufficiency 08/29/2021   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   Encounter for general adult medical examination with abnormal findings 02/18/2018   Enlarged prostate 02/18/2018   Hearing loss 02/18/2018   Other long term (current) drug therapy 02/18/2018   Type 2 diabetes mellitus with hyperglycemia ([place]) 02/18/2018   HLD (hyperlipidemia)    Nephrolithiasis    Parkinson disease ([place])    Diabetes mellitus with coincident hypertension ([place])    Syncope    Renal calculus    Diabetes ([place])    Allergic rhinitis    BPH (benign prostatic hyperplasia)    Erectile dysfunction    Dysphagia, pharyngeal phase    Edema    Ureteral calculus 02/09/2013   LEG PAIN 03/14/2010   PCP:  Josetta Huddle, MD Pharmacy:   [facility] [name] [address] Phone: [phone] Fax: [phone]     Social Determinants of Health (SDOH) Interventions    Readmission Risk Interventions    05/01/2022    2:36 PM  Readmission Risk Prevention Plan  Transportation Screening Complete  PCP or Specialist Appt within 5-7 Days Complete  Home Care Screening Complete  Medication Review (RN CM) Complete

## 2022-05-01 NOTE — NC FL2 (Signed)
[place] LEVEL OF CARE SCREENING TOOL     IDENTIFICATION  Patient [name] Birthdate: 1945-11-09 Sex: male Admission Date (Current Location): 04/29/2022  [facility] and [place] Number:  Herbalist and Address:  [facility],  [place], [place]      Provider Number: [id number]  Attending Physician Name and Address:  Domenic Polite, MD  Relative Name and Phone Number:  Jaysun Wessels [phone]    Current Level of Care: Hospital Recommended Level of Care: [place] Prior Approval Number:    Date Approved/Denied:   PASRR Number: [id number]  Discharge Plan: SNF    Current Diagnoses: Patient Active Problem List   Diagnosis Date Noted   UTI due to extended-spectrum beta lactamase (ESBL) producing Escherichia coli 04/29/2022   Dysphagia 04/29/2022   AKI (acute kidney injury) ([place])    Dehydration    Failure to thrive (child)    Bacteriuria, asymptomatic    Failure to thrive in adult 04/20/2022   Malaise, possible ESBL Klebsiella UTI 04/20/2022   Chronic retention of urine 04/20/2022   Closed lumbar vertebral fracture ([place]) 04/20/2022   UTI (urinary tract infection) 03/01/2022   Leukocytosis 01/01/2022   Hyponatremia 01/01/2022   CKD (chronic kidney disease), stage III ([place]) 01/01/2022   HTN (hypertension) 12/31/2021   History of ESBL Klebsiella pneumoniae infection 12/31/2021   Generalized weakness 12/31/2021   Chronic venous insufficiency 08/29/2021   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   Encounter for general adult medical examination with abnormal findings 02/18/2018   Enlarged prostate 02/18/2018   Hearing loss 02/18/2018   Other long term (current) drug therapy 02/18/2018   Type 2 diabetes mellitus with hyperglycemia ([place]) 02/18/2018   HLD (hyperlipidemia)    Nephrolithiasis    Parkinson disease ([place])    Diabetes mellitus with coincident hypertension ([place])    Syncope     Renal calculus    Diabetes ([place])    Allergic rhinitis    BPH (benign prostatic hyperplasia)    Erectile dysfunction    Dysphagia, pharyngeal phase    Edema    Ureteral calculus 02/09/2013   LEG PAIN 03/14/2010    Orientation RESPIRATION BLADDER Height & Weight     Self, Time, Situation, Place  Normal Incontinent, External catheter Weight: 190 lb (86.2 kg) Height:     BEHAVIORAL SYMPTOMS/MOOD NEUROLOGICAL BOWEL NUTRITION STATUS      Continent Diet (Regular)  AMBULATORY STATUS COMMUNICATION OF NEEDS Skin   Limited Assist Verbally Normal                       Personal Care Assistance Level of Assistance  Bathing, Dressing, Feeding Bathing Assistance: Maximum assistance Feeding assistance: Independent Dressing Assistance: Maximum assistance     Functional Limitations Info  Sight, Hearing, Speech Sight Info: Impaired Hearing Info: Adequate Speech Info: Adequate    SPECIAL CARE FACTORS FREQUENCY  PT (By licensed PT), OT (By licensed OT)     PT Frequency: 5x/wk OT Frequency: 5x/wk            Contractures Contractures Info: Not present    Additional Factors Info  Code Status, Allergies Code Status Info: FULL Allergies Info: Nucynta (Tapentadol), Oxycodone, Sudafed (Pseudoephedrine Hcl), Tramadol Hcl           Current Medications (05/01/2022):  This is the current hospital active medication list Current Facility-Administered Medications  Medication Dose Route Frequency Provider Last Rate Last Admin   acetaminophen (TYLENOL)  tablet 650 mg  650 mg Oral Q6H PRN Tu, Ching T, DO   650 mg at 04/30/22 0418   carbidopa-levodopa (SINEMET IR) 25-100 MG per tablet immediate release 3 tablet  3 tablet Oral 5 times per day Tu, Ching T, DO   3 tablet at 05/01/22 1210   cholecalciferol (VITAMIN D3) tablet 2,000 Units  2,000 Units Oral Daily Tu, Ching T, DO   2,000 Units at 05/01/22 0908   enoxaparin (LOVENOX) injection 40 mg  40 mg Subcutaneous Q24H Tu, Ching T, DO   40 mg  at 04/30/22 2145   entacapone (COMTAN) tablet 200 mg  200 mg Oral 3 times per day Tu, Ching T, DO   200 mg at 05/01/22 1210   guaiFENesin-dextromethorphan (ROBITUSSIN DM) 100-10 MG/5ML syrup 5 mL  5 mL Oral Q4H PRN Gwynne Edinger, MD   5 mL at 05/01/22 0615   insulin aspart (novoLOG) injection 0-9 Units  0-9 Units Subcutaneous TID PC & HS Tu, Ching T, DO   1 Units at 05/01/22 1322   lisinopril (ZESTRIL) tablet 5 mg  5 mg Oral Daily Tu, Ching T, DO   5 mg at 05/01/22 0908   meropenem (MERREM) 1 g in sodium chloride 0.9 % 100 mL IVPB  1 g Intravenous Q8H BellSharyn Lull T, RPH 200 mL/hr at 05/01/22 0923 1 g at 05/01/22 0923   methenamine (MANDELAMINE) tablet 1,000 mg  1,000 mg Oral BID Tu, Ching T, DO   1,000 mg at 05/01/22 0908   pantoprazole (PROTONIX) EC tablet 40 mg  40 mg Oral Daily Tu, Ching T, DO   40 mg at 05/01/22 0908   potassium chloride (KLOR-CON M) CR tablet 10 mEq  10 mEq Oral BID Tu, Ching T, DO   10 mEq at 05/01/22 0908   rOPINIRole (REQUIP) tablet 1.5 mg  1.5 mg Oral QHS Tu, Ching T, DO   1.5 mg at 04/30/22 2146   rOPINIRole (REQUIP) tablet 3 mg  3 mg Oral BID AC Tu, Ching T, DO   3 mg at 05/01/22 4235   tamsulosin (FLOMAX) capsule 0.4 mg  0.4 mg Oral BID Tu, Ching T, DO   0.4 mg at 05/01/22 0908   traMADol (ULTRAM) tablet 50 mg  50 mg Oral Q6H PRN Tu, Ching T, DO   50 mg at 05/01/22 0026   vitamin B-12 (CYANOCOBALAMIN) tablet 1,000 mcg  1,000 mcg Oral Daily Tu, Ching T, DO   1,000 mcg at 05/01/22 3614   vitamin C (ASCORBIC ACID) tablet 250 mg  250 mg Oral BID Tu, Ching T, DO   250 mg at 05/01/22 0908     Discharge Medications: Please see discharge summary for a list of discharge medications.  Relevant Imaging Results:  Relevant Lab Results:   Additional Information SSN# [place], LCSW

## 2022-05-01 NOTE — Evaluation (Signed)
Occupational Therapy Evaluation Patient Details [name] MRN: [id number] DOB: January 29, 1946 Today's Date: 05/01/2022   History of Present Illness Patient is a 76 year old male who was recently admitted from 7/7 to 7/9 with UTI. patient has a history of recurrent UTIs with hospitalizations in March and May for UTI. patient was admitted this hospitalization with UTI due to extended sepctrum beta lactamase producing Escherichia coli and dysphagia. PMH: T12 spinous process fx, L1 ant-middle colum fx and sacral fx.   Clinical Impression   Patient is a 76 year old male who was admitted for above. Patient was living at home with wife and family support prior level. Currently, patient was max A for supine to sit on edge of bed with increased education on proper back precautions. Patient was mod A  to complete transfer with new onset of R lean noted when supine in bed and when sitting EOB. Patients strength in BUE is symmetrical. Patient appears to be unaware of leaning. Nursing aware. Patient is not at baseline at this time. Patient would continue to benefit from skilled OT services at this time while admitted and after d/c to address noted deficits in order to improve overall safety and independence in ADLs.       Recommendations for follow up therapy are one component of a multi-disciplinary discharge planning process, led by the attending physician.  Recommendations may be updated based on patient status, additional functional criteria and insurance authorization.   Follow Up Recommendations  Home health OT    Assistance Recommended at Discharge Frequent or constant Supervision/Assistance  Patient can return home with the following A little help with bathing/dressing/bathroom;Assistance with cooking/housework;Direct supervision/assist for financial management;Assist for transportation;Help with stairs or ramp for entrance;Direct supervision/assist for medications management;A lot of help with  walking and/or transfers    Functional Status Assessment  Patient has had a recent decline in their functional status and demonstrates the ability to make significant improvements in function in a reasonable and predictable amount of time.  Equipment Recommendations  None recommended by OT    Recommendations for Other Services       Precautions / Restrictions Precautions Precautions: Fall Precaution Comments: back precautions Required Braces or Orthoses: Spinal Brace Spinal Brace: Lumbar corset;Applied in sitting position;Other (comment) Spinal Brace Comments: on when OOB Restrictions Weight Bearing Restrictions: No      Mobility Bed Mobility Overal bed mobility: Needs Assistance   Rolling: Mod assist (with increased cues) Sidelying to sit: Max assist       General bed mobility comments: patient needed increased time and cues for back preacutions with NT in room made aware. NT verbalized understanding.    Transfers                          Balance Overall balance assessment: Needs assistance, History of Falls Sitting-balance support: Feet supported, No upper extremity supported Sitting balance-Leahy Scale: Poor   Postural control: Right lateral lean Standing balance support: Bilateral upper extremity supported, During functional activity, Reliant on assistive device for balance Standing balance-Leahy Scale: Poor                             ADL either performed or assessed with clinical judgement   ADL Overall ADL's : Needs assistance/impaired Eating/Feeding: Sitting;Modified independent   Grooming: Set up;Sitting   Upper Body Bathing: Set up;Sitting   Lower Body Bathing: Sitting/lateral leans;Maximal assistance  Upper Body Dressing : Maximal assistance;Sitting Upper Body Dressing Details (indicate cue type and reason): donning TLSO. Lower Body Dressing: Total assistance Lower Body Dressing Details (indicate cue type and reason): to  don socks. wife has been assisting with LB dressing since back fxs. Toilet Transfer: Moderate assistance Toilet Transfer Details (indicate cue type and reason): patient was mod A to transfer from edge of bed to recliner in room with increased time and noted distraction on others in room while attempting to follow cues from therapist. patient was noted to take choppy steps with notable pauses. Toileting- Clothing Manipulation and Hygiene: Maximal assistance;Sit to/from stand               Vision Baseline Vision/History: 1 Wears glasses Patient Visual Report: No change from baseline       Perception     Praxis      Pertinent Vitals/Pain Pain Assessment Faces Pain Scale: Hurts little more Pain Location: back Pain Descriptors / Indicators: Sore Pain Intervention(s): Limited activity within patient's tolerance, Monitored during session, Repositioned     Hand Dominance Right   Extremity/Trunk Assessment Upper Extremity Assessment Upper Extremity Assessment: Overall WFL for tasks assessed (strenght symmetrical)   Lower Extremity Assessment Lower Extremity Assessment: Defer to PT evaluation   Cervical / Trunk Assessment Cervical / Trunk Assessment: Kyphotic;Other exceptions Cervical / Trunk Exceptions: noted to have R lean when supine in bed and when sitting EOB. patient denies pain causing this   Communication Communication Communication: No difficulties   Cognition Arousal/Alertness: Awake/alert Behavior During Therapy: WFL for tasks assessed/performed Overall Cognitive Status: Impaired/Different from baseline                                 General Comments: patient was noted to remeber this therapist from previous hospital admissions. patient was noted to have a hard time answering questions about time from last hospitalization to this one. patient when asked about most recent fall was noted to report about fall in June that resulted in back fractures.      General Comments       Exercises     Shoulder Instructions      Home Living Family/patient expects to be discharged to:: Private residence Living Arrangements: Spouse/significant other Available Help at Discharge: Available PRN/intermittently;Family Type of Home: House Home Access: Stairs to enter [employer] of Steps: 5 steps   Home Layout: Able to live on main level with bedroom/bathroom;Laundry or work area in basement     [employer] Shower/Tub: [place]: Kasandra Knudsen - single Water quality scientist - built in;Shower seat;Rolling Environmental consultant (2 wheels)   Additional Comments: 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 still bends too much with reacher for pants and compression stockings with wife assist for these things since fall.        OT Problem List: Decreased activity tolerance;Impaired balance (sitting and/or standing);Decreased safety awareness;Decreased knowledge of use of DME or AE;Decreased knowledge of precautions      OT Treatment/Interventions: Self-care/ADL training;Therapeutic exercise;Neuromuscular education;Energy conservation;DME and/or AE instruction;Therapeutic activities;Balance training;Patient/family education    OT Goals(Current goals can be found in the care plan section) Acute Rehab OT Goals  Patient Stated Goal: to get better OT Goal Formulation: With patient Time For Goal Achievement: 05/15/22 Potential to Achieve Goals: Fair  OT Frequency: Min 2X/week    Co-evaluation              AM-PAC OT "6 Clicks" Daily Activity     Outcome Measure Help from another person eating meals?: None Help from another person taking care of personal grooming?: A Little Help from another person toileting, which includes using  toliet, bedpan, or urinal?: A Lot Help from another person bathing (including washing, rinsing, drying)?: A Lot Help from another person to put on and taking off regular upper body clothing?: A Lot Help from another person to put on and taking off regular lower body clothing?: A Lot 6 Click Score: 15   End of Session Equipment Utilized During Treatment: Rolling walker (2 wheels) (TLSO) Nurse Communication: Mobility status;Other (comment) (nurse was in room for portion of session)  Activity Tolerance: Patient tolerated treatment well Patient left: in chair;with call bell/phone within reach;with family/visitor present  OT Visit Diagnosis: Other abnormalities of gait and mobility (R26.89);Muscle weakness (generalized) (M62.81)                Time: 3664-4034 OT Time Calculation (min): 26 min Charges:  OT General Charges $OT Visit: 1 Visit OT Evaluation $OT Eval Moderate Complexity: 1 Mod OT Treatments $Self Care/Home Management : 8-22 mins  Jackelyn Poling OTR/L, MS Acute Rehabilitation Department Office# [phone] Pager# [phone]   Marcellina Millin 05/01/2022, 12:11 PM

## 2022-05-01 NOTE — Progress Notes (Signed)
Pt connected to bedside continuous pulse ox monitor. Alarms set & audible.

## 2022-05-01 NOTE — Consult Note (Addendum)
H&P Physician requesting consult: Domenic Polite  Chief Complaint: Recurrent UTI  History of Present Illness: Patient is a 76 year old male with history of Parkinson's disease.  He had overactive bladder symptoms and was started on Myrbetriq.  He subsequently developed urinary retention.  Even though he stopped the Myrbetriq, the urinary retention was persistent.  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  After discussion with the patient, he elected to proceed with Rezum.  This was felt to pose a lower risk of urinary incontinence from his detrusor overactivity and progressive Parkinson's disease.  Following the Rezum procedure, he passed a voiding trial.  He subsequently had some intermittent episodes of urinary retention but ultimately got out of retention.  He has maintained residuals between 100-200.  He continues on tamsulosin.  He no longer has significant obstructive voiding complaints.  He states that he feels like he voids well without an obstructed flow.  He unfortunately continues to have intermittent urinary tract infections with Klebsiella ESBL only sensitive to imipenem.  He has had a CT without contrast on 09/21/2021 that showed small bilateral renal lesions for which MRI was recommended.  Subsequent MRI showed small complex cystic mass in the right kidney for which follow-up was recommended.  He also had some small renal cysts.  He subsequently underwent a repeat MRI of the abdomen with and without contrast on 04/16/2022.  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  There was no hydronephrosis.  He has not had recent pelvic imaging to rule out prostate abscess.  Patient was last admitted on 7/7 to 7/9 with progressive malaise and was treated with meropenem for 2 days but subsequently discontinued due to absence of localized UTI  symptoms, fever, or leukocytosis.  He subsequently has been getting weaker.  In general, he has been getting weaker.  He fell in June and apparently had spinal fractures and is recovering from that.  He presented this time to the emergency department febrile to 101.4 with intermittent hypertensive episodes.  He was started on IV meropenem.  Urine culture was positive again for Klebsiella sensitive only to imipenem.  He is followed by infectious disease outpatient and was on Macrobid prophylaxis.  He has also been on methenamine as an outpatient.  Talking to the patient today, he states he feels like he has been voiding well without any obstructive voiding complaints.  Denies any hematuria or dysuria.  Typically when he is admitted for urinary tract infections, he does not have typical lower urinary tract symptoms manifestations of a UTI.  Very difficult to differentiate true UTI versus asymptomatic bacteriuria in his case given his other comorbidities.  Per nursing staff, most recent postvoid residual was 187 cc.  Past Medical History:  Diagnosis Date   Abnormal involuntary movement 02/18/2018   Allergic rhinitis    Arthritis    BPH (benign prostatic hyperplasia)    Diabetes ([place])    Dysphagia, pharyngeal phase    GERD diagnosed on barium swallow. Has small hiatal hernia. Symptomatically somewhat better on omeprazole but not entirely. We'll try b.i.d. therapy   Edema    1+ in both ankles, likely multifactorial including medication such as Requip   Erectile dysfunction    Staxyn 10 mg or Viagra worked well. 3 samples of Cialis 20 mg provided   GERD (gastroesophageal reflux disease)    Hypercholesteremia    Hypertension    Nephrolithiasis  Onychomycosis of toenail    April 27, 2013 - Dr. Inocencio Homes - podiatry, was in [place] - treating with oral Lamisil and topical nail therapy   Parkinson's disease [facility])     followed by Dr. Lezlie Octave at [facility] and [name], M.D. in [facility]    Presbycusis    and tinnitus - Dr. Izora Gala - August/2013   Renal calculus    Syncope    Past Surgical History:  Procedure Laterality Date   HAND SURGERY     INGUINAL HERNIA REPAIR Left 11/04/2015   Procedure: LEFT INGUINAL HERNIA REPAIR WITH MESH;  Surgeon: Armandina Gemma, MD;  Location: [place];  Service: General;  Laterality: Left;   INSERTION OF MESH Left 11/04/2015   Procedure: INSERTION OF MESH;  Surgeon: Armandina Gemma, MD;  Location: [place];  Service: General;  Laterality: Left;   JOINT REPLACEMENT Bilateral    KNEE SURGERY     TOTAL KNEE ARTHROPLASTY      Home Medications:  Medications Prior to Admission  Medication Sig Dispense Refill Last Dose   acetaminophen (TYLENOL) 500 MG tablet Take 1,000 mg by mouth every 6 (six) hours as needed (for pain).   Past Week   ALEVE 220 MG tablet Take 660 mg by mouth every 6 (six) hours as needed (for pain).   Past Week   amoxicillin (AMOXIL) 500 MG capsule Take 2,000 mg by mouth See admin instructions. Take 2,000 mg by mouth one hour prior to dental appointments   03/2022   Ascorbic Acid (VITAMIN C) 100 MG tablet Take 100 mg by mouth in the morning and at bedtime.   04/28/2022   calcium carbonate (TUMS) 500 MG chewable tablet Chew 2 tablets by mouth daily as needed for indigestion or heartburn.   unk   carbidopa-levodopa (SINEMET IR) 25-100 MG tablet Take 3 tablets by mouth 4 (four) times daily. (Patient taking differently: Take 3 tablets by mouth See admin instructions. Take 3 tablets by mouth at 6 AM, 9 AM, 12 NOON, 3 PM, and 6 PM, but can sometimes average only a sum total of 13 tablets/day) 1080 tablet 1 04/29/2022 at 0600   cholecalciferol (VITAMIN D) 1000 UNITS tablet Take 2,000 Units by mouth daily.   04/28/2022   clotrimazole (LOTRIMIN AF) 1 % cream Apply 1 application. topically 2 (two) times daily. (Patient taking differently: Apply 1 application  topically 2 (two) times daily as needed (for yeast  infections resulting from taking antibiotics).) 30 g 3 unk   entacapone (COMTAN) 200 MG tablet TAKE 1 TABLET BY MOUTH 3 TIMES DAILY (WITH FIRST THREE DOSAGES OF CARBIDOPA/LEVODOPA). (Patient taking differently: Take 200 mg by mouth See admin instructions. Take 200 mg by mouth at 6 AM, 9 AM, and 12 NOON) 270 tablet 0 04/28/2022   furosemide (LASIX) 20 MG tablet Take 2 tablets (40 mg total) by mouth daily. (Patient taking differently: Take 20 mg by mouth daily as needed for fluid or edema.)   unk   hydrocortisone cream 0.5 % Apply 1 application. topically 2 (two) times daily. (Patient taking differently: Apply 1 application  topically 2 (two) times daily as needed for itching.) 30 g 3 unk   LANTUS SOLOSTAR 100 UNIT/ML Solostar Pen Inject 15-20 Units into the skin at bedtime.   04/29/2022   lisinopril (ZESTRIL) 5 MG tablet Take 5 mg by mouth daily.   04/28/2022   metFORMIN (GLUCOPHAGE) 500 MG tablet Take 500 mg by mouth 2 (two) times daily with  a meal.   04/27/2022   methenamine (MANDELAMINE) 1 g tablet Take 1 tablet (1,000 mg total) by mouth 2 (two) times daily. Take with any vitamin c, twice a day for uti prevention (Patient taking differently: Take 1,000 mg by mouth in the morning and at bedtime.) 180 tablet 3 04/27/2022   nitrofurantoin, macrocrystal-monohydrate, (MACROBID) 100 MG capsule Take 1 capsule (100 mg total) by mouth daily. (Patient taking differently: Take 100 mg by mouth 2 (two) times daily.) 90 capsule 3 04/28/2022   omeprazole (PRILOSEC) 20 MG capsule Take 20 mg by mouth daily as needed (acid reflux).   unk   potassium chloride SA (KLOR-CON) 20 MEQ tablet Take 10 mEq by mouth 2 (two) times daily.   04/28/2022   rOPINIRole (REQUIP) 3 MG tablet Take 1 tablet in the morning, Take  1 in the afternoon, Take a  half tablet in the evening (Patient taking differently: Take 1.5-3 mg by mouth See admin instructions. Take 3 mg by mouth in the morning and afternoon. Take 1.5 mg in the evening.) 225 tablet 1  04/28/2022   tamsulosin (FLOMAX) 0.4 MG CAPS capsule Take 0.4 mg by mouth in the morning and at bedtime.  11 04/28/2022   traMADol (ULTRAM) 50 MG tablet Take 50 mg by mouth every 6 (six) hours as needed (for pain).   unk   triamcinolone (NASACORT) 55 MCG/ACT AERO nasal inhaler Place 2 sprays into the nose daily as needed (for allergies or rhinitis).   unk   vitamin B-12 (CYANOCOBALAMIN) 1000 MCG tablet Take 1,000 mcg by mouth daily.   unk   GLOBAL EASE INJECT PEN NEEDLES 31G X 5 MM MISC Inject into the skin.      Allergies:  Allergies  Allergen Reactions   Nucynta [Tapentadol] Other (See Comments)    Made the patient not feel well. "He did not feel well at all."   Oxycodone Itching   Sudafed [Pseudoephedrine Hcl] Other (See Comments)    Problems urinating    Tramadol Hcl Itching and Other (See Comments)    Can tolerate, however (in 2023)    Family History  Problem Relation Age of Onset   Atrial fibrillation Mother    Breast cancer Mother    Emphysema Father    Heart disease Father    Cancer Brother        African Burkitt   Social History:  reports that he has never smoked. He has never used smokeless tobacco. He reports that he does not drink alcohol and does not use drugs.  ROS: A complete review of systems was performed.  All systems are negative except for pertinent findings as noted. ROS   Physical Exam:  Vital signs in last 24 hours: Temp:  [98.5 F (36.9 C)-99.3 F (37.4 C)] 99.3 F (37.4 C) (07/18 0247) Pulse Rate:  [76-95] 92 (07/18 0247) Resp:  [16-21] 16 (07/18 0247) BP: (125-134)/(69-77) 131/77 (07/18 0247) SpO2:  [94 %-98 %] 94 % (07/18 0247) General:  Alert and oriented, No acute distress laying comfortably in bed HEENT: Normocephalic, atraumatic Cardiovascular: Regular rate and rhythm Lungs: Regular rate and effort Abdomen: Soft, nontender, nondistended, no abdominal masses Back: No CVA tenderness Extremities: No edema   Laboratory Data:  Results for  orders placed or performed during the hospital encounter of 04/29/22 (from the past 24 hour(s))  Glucose, capillary     Status: Abnormal   Collection Time: 04/30/22  8:48 PM  Result Value Ref Range   Glucose-Capillary 200 (H) 70 -  99 mg/dL  Basic metabolic panel     Status: Abnormal   Collection Time: 05/01/22  5:59 AM  Result Value Ref Range   Sodium 134 (L) 135 - 145 mmol/L   Potassium 4.3 3.5 - 5.1 mmol/L   Chloride 101 98 - 111 mmol/L   CO2 26 22 - 32 mmol/L   Glucose, Bld 153 (H) 70 - 99 mg/dL   BUN 16 8 - 23 mg/dL   Creatinine, Ser 1.06 0.61 - 1.24 mg/dL   Calcium 8.7 (L) 8.9 - 10.3 mg/dL   GFR, Estimated >60 >60 mL/min   Anion gap 7 5 - 15  Glucose, capillary     Status: Abnormal   Collection Time: 05/01/22  7:49 AM  Result Value Ref Range   Glucose-Capillary 155 (H) 70 - 99 mg/dL  Glucose, capillary     Status: Abnormal   Collection Time: 05/01/22 11:22 AM  Result Value Ref Range   Glucose-Capillary 144 (H) 70 - 99 mg/dL  Glucose, capillary     Status: Abnormal   Collection Time: 05/01/22  4:13 PM  Result Value Ref Range   Glucose-Capillary 168 (H) 70 - 99 mg/dL   Recent Results (from the past 240 hour(s))  Blood Culture (routine x 2)     Status: None (Preliminary result)   Collection Time: 04/29/22  4:37 PM   Specimen: BLOOD  Result Value Ref Range Status   Specimen Description   Final    BLOOD BLOOD LEFT HAND Performed at [facility], [place] [address], [place], [place] [address]    Special Requests   Final    BOTTLES DRAWN AEROBIC ONLY Blood Culture results may not be optimal due to an inadequate volume of blood received in culture bottles Performed at [facility] [address] [place], [place] [address]    Culture   Final    NO GROWTH 2 DAYS Performed at [facility] [address], [place], [place] [address]    Report Status PENDING  Incomplete  Resp Panel by RT-PCR (Flu A&B, Covid) Anterior Nasal Swab      Status: None   Collection Time: 04/29/22  4:40 PM   Specimen: Anterior Nasal Swab  Result Value Ref Range Status   SARS Coronavirus 2 by RT PCR NEGATIVE NEGATIVE Final    Comment: (NOTE) SARS-CoV-2 target nucleic acids are NOT DETECTED.  The SARS-CoV-2 RNA is generally detectable in upper respiratory specimens during the acute phase of infection. The lowest concentration of SARS-CoV-2 viral copies this assay can detect is 138 copies/mL. A negative result does not preclude SARS-Cov-2 infection and should not be used as the sole basis for treatment or other patient management decisions. A negative result may occur with  improper specimen collection/handling, submission of specimen other than nasopharyngeal swab, presence of viral mutation(s) within the areas targeted by this assay, and inadequate number of viral copies(<138 copies/mL). A negative result must be combined with clinical observations, patient history, and epidemiological information. The expected result is Negative.  Fact Sheet for Patients:  EntrepreneurPulse.com.au  Fact Sheet for Healthcare Providers:  IncredibleEmployment.be  This test is no t yet approved or cleared by the [place] FDA and  has been authorized for detection and/or diagnosis of SARS-CoV-2 by FDA under an Emergency Use Authorization (EUA). This EUA will remain  in effect (meaning this test can be used) for the duration of the COVID-19 declaration under Section 564(b)(1) of the Act, 21 U.S.C.section 360bbb-3(b)(1), unless the  authorization is terminated  or revoked sooner.       Influenza A by PCR NEGATIVE NEGATIVE Final   Influenza B by PCR NEGATIVE NEGATIVE Final    Comment: (NOTE) The Xpert Xpress SARS-CoV-2/FLU/RSV plus assay is intended as an aid in the diagnosis of influenza from Nasopharyngeal swab specimens and should not be used as a sole basis for treatment. Nasal washings and aspirates are  unacceptable for Xpert Xpress SARS-CoV-2/FLU/RSV testing.  Fact Sheet for Patients: EntrepreneurPulse.com.au  Fact Sheet for Healthcare Providers: IncredibleEmployment.be  This test is not yet approved or cleared by the [place] FDA and has been authorized for detection and/or diagnosis of SARS-CoV-2 by FDA under an Emergency Use Authorization (EUA). This EUA will remain in effect (meaning this test can be used) for the duration of the COVID-19 declaration under Section 564(b)(1) of the Act, 21 U.S.C. section 360bbb-3(b)(1), unless the authorization is terminated or revoked.  Performed at [facility], [place] [address], [place], [place] [address]   Urine Culture     Status: Abnormal   Collection Time: 04/29/22  4:40 PM   Specimen: In/Out Cath Urine  Result Value Ref Range Status   Specimen Description   Final    IN/OUT CATH URINE Performed at [place] [address] [place], [place] [address]    Special Requests   Final    NONE Performed at [facility], [place] [address] [place], [place] [address]    Culture (A)  Final    >=100,000 COLONIES/mL KLEBSIELLA PNEUMONIAE Confirmed Extended Spectrum Beta-Lactamase Producer (ESBL).  In bloodstream infections from ESBL organisms, carbapenems are preferred over piperacillin/tazobactam. They are shown to have a lower risk of mortality.    Report Status 05/01/2022 FINAL  Final   Organism ID, Bacteria KLEBSIELLA PNEUMONIAE (A)  Final      Susceptibility   Klebsiella pneumoniae - MIC*    AMPICILLIN >=32 RESISTANT Resistant     CEFAZOLIN >=64 RESISTANT Resistant     CEFEPIME >=32 RESISTANT Resistant     CEFTRIAXONE >=64 RESISTANT Resistant     CIPROFLOXACIN >=4 RESISTANT Resistant     GENTAMICIN >=16 RESISTANT Resistant     IMIPENEM 0.5 SENSITIVE Sensitive     NITROFURANTOIN 256 RESISTANT Resistant     TRIMETH/SULFA >=320 RESISTANT  Resistant     AMPICILLIN/SULBACTAM >=32 RESISTANT Resistant     PIP/TAZO >=128 RESISTANT Resistant     * >=100,000 COLONIES/mL KLEBSIELLA PNEUMONIAE  Blood Culture (routine x 2)     Status: None (Preliminary result)   Collection Time: 04/29/22  6:00 PM   Specimen: BLOOD  Result Value Ref Range Status   Specimen Description   Final    BLOOD LEFT ANTECUBITAL Performed at [facility] [address], [place], [place] [address]    Special Requests   Final    BOTTLES DRAWN AEROBIC AND ANAEROBIC Blood Culture adequate volume Performed at [facility] [address] [place], [place] [address]    Culture   Final    NO GROWTH 1 DAY Performed at [facility], [place] [address] [place],  [address]    Report Status PENDING  Incomplete   Creatinine: Recent Labs    04/29/22 1640 05/01/22 0559  CREATININE 0.98 1.06    Impression/Assessment:  Recurrent ESBL Klebsiella urinary tract infections BPH with lower urinary tract symptoms Incomplete bladder emptying  Plan:  I discussed the condition again with the patient and called his wife  as well.  He has not had recent pelvic imaging.  Though he does have a complex cyst that is 1.1 cm in his kidney and some simple cysts, I think it is unlikely to be causing his recurrent issues.  We will proceed with CT of the pelvis with IV contrast to rule out prostatic abscess.  Agree with being very cautious with treating asymptomatic bacteriuria unless he has a clear infection.  This is to avoid development of a bit more resistant organism.  However, given his weakness and fever with positive culture, agree with continuing antibiotics.  Duration per infectious disease  He does have slightly elevated residual of 187.  Not fully in retention so I recommended against Foley catheter placement.  Continue tamsulosin.  He denies any significant voiding complaints.  Unclear if obstruction is contributing.  1 option  would be to repeat a urodynamics.  Would be hesitant to perform urodynamics off antibiotics given the lack of oral prophylactic options.  Another option would be to perform a flow rate/PVR in the clinic to see if he has a low flow rate.  If there is evidence of prostatic obstruction, TURP is an option.  However, this does carry significant risk of urinary incontinence following the procedure given his progressive Parkinson's disease.  Hesitant to proceed with TURP given his absence of objective voiding complaints.  May be an option however with the recurrent UTIs if unable to clear.  As mentioned above, we will obtain a CT of the pelvis to rule out prostatic abscess.  Ultimately, the patient's wife is interested in obtaining consultation at [facility] (or another academic center).  Obviously difficult situation for the patient with recurrent ESBL infections that do not have a very clear solution.  Even if he were to undergo a bladder outlet procedure such as TURP, progressive Parkinson's disease patients often have significant voiding dysfunction that can manifest similarly.  Marton Redwood, III 05/01/2022, 5:29 PM

## 2022-05-01 NOTE — Progress Notes (Addendum)
PROGRESS NOTE    [name]  IRS:[id number] DOB: 07-24-1946 DOA: 04/29/2022 PCP: Josetta Huddle, MD  Brief Narrative:  [name] is a 76 y.o. 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  Recently hospitalized 7/7-7/9 for same treated with meropenem, has been weaker since then -Brought to the ED 7/16 evening with significant weakness, febrile to 101.4, UA abnormal again, chest x-ray was unremarkable, also  followed by ID for recurrent ESBL UTIs  Assessment & Plan:  Recurrent ESBL UTI -Recent admission for Klebsiella ESBL and few earlier this year -Cultures unchanged -Monitor for intermittent retention, check postvoid residual, bladder scans done yesterday were unremarkable, if PVR is elevated may end up needing chronic Foley or suprapubic catheter, will d/w Urology -ID following  Small pleural effusion -Cough and dyspnea noted overnight, repeat chest x-ray poor quality -IV Lasix low-dose X1, incentive spirometry and out of bed to chair as tolerated -Monitor clinically   Dysphagia -worsening over the past week. Could be worsen progression of Parkinson -SLP evaluation completed, mild aspiration risk, regular diet with thin liquids recommended for now   HTN (hypertension) Continue Lisinopril. Controlled   Diabetes ([place]) -CBGs are stable continue sliding scale insulin   Parkinson disease ([place]) Worsening weakness likely due to current UTI -continue home Sinemet, entacapone and requip - PT/OT consult  DVT prophylaxis: lovenox Code Status: full Family Communication: Discussed with daughter at bedside Dispo: To be determined, PT OT eval pending  Consultants:  ID   Antimicrobials:  meropenem    Subjective: Malaise. No cough or abd pain  Objective: Vitals:   04/30/22 1816 04/30/22 2217  04/30/22 2243 05/01/22 0247  BP: 125/76 134/69  131/77  Pulse: 95 76  92  Resp:  (!) '21 20 16  '$ Temp:  98.5 F (36.9 C)  99.3 F (37.4 C)  TempSrc:  Oral  Oral  SpO2:  94% 98% 94%  Weight:        Intake/Output Summary (Last 24 hours) at 05/01/2022 1137 Last data filed at 05/01/2022 1053 Gross per 24 hour  Intake 594 ml  Output 975 ml  Net -381 ml   Filed Weights   04/29/22 1629  Weight: 86.2 kg    Examination:  General exam: Chronically ill male sitting up in bed, awake alert oriented to self and place, cognitive deficits noted CVS: S1-S2, regular rhythm Lungs: Decreased breath sounds to bases Abdomen: Soft, obese, nontender, bowel sounds present Extremities: No edema Psych: Flat affect    Data Reviewed: I have personally reviewed following labs and imaging studies  CBC: Recent Labs  Lab 04/29/22 1640 04/30/22 0440  WBC 9.8 10.1  NEUTROABS 8.1*  --   HGB 13.3 12.2*  HCT 40.8 36.5*  MCV 91.5 90.6  PLT 245 009   Basic Metabolic Panel: Recent Labs  Lab 04/29/22 1640 05/01/22 0559  NA 137 134*  K 4.5 4.3  CL 104 101  CO2 20* 26  GLUCOSE 207* 153*  BUN 17 16  CREATININE 0.98 1.06  CALCIUM 8.9 8.7*   GFR: Estimated Creatinine Clearance: 66.1 mL/min (by C-G formula based on SCr of 1.06 mg/dL). Liver Function Tests: Recent Labs  Lab 04/29/22 1640  AST 10*  ALT 5  ALKPHOS 89  BILITOT 0.6  PROT 6.8  ALBUMIN 3.3*   No results for input(s): "LIPASE", "AMYLASE" in the last 168 hours. No results  for input(s): "AMMONIA" in the last 168 hours. Coagulation Profile: Recent Labs  Lab 04/29/22 1750  INR 1.0   Cardiac Enzymes: No results for input(s): "CKTOTAL", "CKMB", "CKMBINDEX", "TROPONINI" in the last 168 hours. BNP (last 3 results) No results for input(s): "PROBNP" in the last 8760 hours. HbA1C: No results for input(s): "HGBA1C" in the last 72 hours. CBG: Recent Labs  Lab 04/30/22 1203 04/30/22 1657 04/30/22 2048 05/01/22 0749  05/01/22 1122  GLUCAP 161* 169* 200* 155* 144*   Lipid Profile: No results for input(s): "CHOL", "HDL", "LDLCALC", "TRIG", "CHOLHDL", "LDLDIRECT" in the last 72 hours. Thyroid Function Tests: No results for input(s): "TSH", "T4TOTAL", "FREET4", "T3FREE", "THYROIDAB" in the last 72 hours. Anemia Panel: No results for input(s): "VITAMINB12", "FOLATE", "FERRITIN", "TIBC", "IRON", "RETICCTPCT" in the last 72 hours. Urine analysis:    Component Value Date/Time   COLORURINE YELLOW 04/29/2022 1640   APPEARANCEUR HAZY (A) 04/29/2022 1640   LABSPEC 1.015 04/29/2022 1640   PHURINE 6.0 04/29/2022 1640   GLUCOSEU 150 (A) 04/29/2022 1640   HGBUR SMALL (A) 04/29/2022 1640   BILIRUBINUR NEGATIVE 04/29/2022 1640   KETONESUR 5 (A) 04/29/2022 1640   PROTEINUR 100 (A) 04/29/2022 1640   UROBILINOGEN 0.2 10/25/2009 1404   NITRITE POSITIVE (A) 04/29/2022 1640   LEUKOCYTESUR LARGE (A) 04/29/2022 1640   Sepsis Labs: '@LABRCNTIP'$ (procalcitonin:4,lacticidven:4)  ) Recent Results (from the past 240 hour(s))  Blood Culture (routine x 2)     Status: None (Preliminary result)   Collection Time: 04/29/22  4:37 PM   Specimen: BLOOD  Result Value Ref Range Status   Specimen Description   Final    BLOOD BLOOD LEFT HAND Performed at [facility], [place] [address] [place], [place] [address]    Special Requests   Final    BOTTLES DRAWN AEROBIC ONLY Blood Culture results may not be optimal due to an inadequate volume of blood received in culture bottles Performed at [facility] [address], [place], [place] [address]    Culture   Final    NO GROWTH < 24 HOURS Performed at [facility] [address], [place], [place] [address]    Report Status PENDING  Incomplete  Resp Panel by RT-PCR (Flu A&B, Covid) Anterior Nasal Swab     Status: None   Collection Time: 04/29/22  4:40 PM   Specimen: Anterior Nasal Swab  Result Value Ref Range Status   SARS Coronavirus 2  by RT PCR NEGATIVE NEGATIVE Final    Comment: (NOTE) SARS-CoV-2 target nucleic acids are NOT DETECTED.  The SARS-CoV-2 RNA is generally detectable in upper respiratory specimens during the acute phase of infection. The lowest concentration of SARS-CoV-2 viral copies this assay can detect is 138 copies/mL. A negative result does not preclude SARS-Cov-2 infection and should not be used as the sole basis for treatment or other patient management decisions. A negative result may occur with  improper specimen collection/handling, submission of specimen other than nasopharyngeal swab, presence of viral mutation(s) within the areas targeted by this assay, and inadequate number of viral copies(<138 copies/mL). A negative result must be combined with clinical observations, patient history, and epidemiological information. The expected result is Negative.  Fact Sheet for Patients:  EntrepreneurPulse.com.au  Fact Sheet for Healthcare Providers:  IncredibleEmployment.be  This test is no t yet approved or cleared by the [place] FDA and  has been authorized for detection and/or diagnosis of SARS-CoV-2 by FDA under an Emergency Use Authorization (EUA). This EUA  will remain  in effect (meaning this test can be used) for the duration of the COVID-19 declaration under Section 564(b)(1) of the Act, 21 U.S.C.section 360bbb-3(b)(1), unless the authorization is terminated  or revoked sooner.       Influenza A by PCR NEGATIVE NEGATIVE Final   Influenza B by PCR NEGATIVE NEGATIVE Final    Comment: (NOTE) The Xpert Xpress SARS-CoV-2/FLU/RSV plus assay is intended as an aid in the diagnosis of influenza from Nasopharyngeal swab specimens and should not be used as a sole basis for treatment. Nasal washings and aspirates are unacceptable for Xpert Xpress SARS-CoV-2/FLU/RSV testing.  Fact Sheet for Patients: EntrepreneurPulse.com.au  Fact  Sheet for Healthcare Providers: IncredibleEmployment.be  This test is not yet approved or cleared by the [place] FDA and has been authorized for detection and/or diagnosis of SARS-CoV-2 by FDA under an Emergency Use Authorization (EUA). This EUA will remain in effect (meaning this test can be used) for the duration of the COVID-19 declaration under Section 564(b)(1) of the Act, 21 U.S.C. section 360bbb-3(b)(1), unless the authorization is terminated or revoked.  Performed at [facility], [place] [address], [place], [place] [address]   Urine Culture     Status: Abnormal   Collection Time: 04/29/22  4:40 PM   Specimen: In/Out Cath Urine  Result Value Ref Range Status   Specimen Description   Final    IN/OUT CATH URINE Performed at [facility] [address] [place], [place] [address]    Special Requests   Final    NONE Performed at [facility], [place] [address], [place], [place] [address]    Culture (A)  Final    >=100,000 COLONIES/mL KLEBSIELLA PNEUMONIAE Confirmed Extended Spectrum Beta-Lactamase Producer (ESBL).  In bloodstream infections from ESBL organisms, carbapenems are preferred over piperacillin/tazobactam. They are shown to have a lower risk of mortality.    Report Status 05/01/2022 FINAL  Final   Organism ID, Bacteria KLEBSIELLA PNEUMONIAE (A)  Final      Susceptibility   Klebsiella pneumoniae - MIC*    AMPICILLIN >=32 RESISTANT Resistant     CEFAZOLIN >=64 RESISTANT Resistant     CEFEPIME >=32 RESISTANT Resistant     CEFTRIAXONE >=64 RESISTANT Resistant     CIPROFLOXACIN >=4 RESISTANT Resistant     GENTAMICIN >=16 RESISTANT Resistant     IMIPENEM 0.5 SENSITIVE Sensitive     NITROFURANTOIN 256 RESISTANT Resistant     TRIMETH/SULFA >=320 RESISTANT Resistant     AMPICILLIN/SULBACTAM >=32 RESISTANT Resistant     PIP/TAZO >=128 RESISTANT Resistant     * >=100,000 COLONIES/mL KLEBSIELLA  PNEUMONIAE  Blood Culture (routine x 2)     Status: None (Preliminary result)   Collection Time: 04/29/22  6:00 PM   Specimen: BLOOD  Result Value Ref Range Status   Specimen Description   Final    BLOOD LEFT ANTECUBITAL Performed at [facility] [address], [place], [place] [address]    Special Requests   Final    BOTTLES DRAWN AEROBIC AND ANAEROBIC Blood Culture adequate volume Performed at [facility] [address], [place], [place] [address]    Culture   Final    NO GROWTH < 12 HOURS Performed at [facility] [address] [place], [place] [address]    Report Status PENDING  Incomplete         Radiology Studies: DG CHEST PORT 1 VIEW  Result Date: 04/30/2022 CLINICAL DATA:  Shortness of breath. EXAM: PORTABLE CHEST 1 VIEW COMPARISON:  04/29/2022. FINDINGS: Examination is limited due to patient rotation. The heart is enlarged and the mediastinal contour is stable. Lung volumes are low with patchy airspace disease at the lung bases. There are hazy opacities at the lung bases in the possibility of pleural effusions can not be excluded. No definite pneumothorax. No acute osseous abnormality. IMPRESSION: 1. Patchy airspace disease and hazy opacification at the lung bases, possible atelectasis or infiltrate. The possibility of small pleural effusions can not be excluded. 2. Cardiomegaly. Electronically Signed   By: Brett Fairy M.D.   On: 04/30/2022 23:44   DG Chest Port 1 View  Result Date: 04/29/2022 CLINICAL DATA:  Possible sepsis EXAM: PORTABLE CHEST 1 VIEW COMPARISON:  04/20/2022 FINDINGS: Transverse diameter of the heart is in the upper limits of normal. There are no signs of pulmonary edema or focal pulmonary consolidation. Left hemidiaphragm is elevated. There is no pleural effusion or pneumothorax. IMPRESSION: No active disease. Electronically Signed   By: Elmer Picker M.D.   On: 04/29/2022 17:27         Scheduled Meds:  carbidopa-levodopa  3 tablet Oral 5 times per day   cholecalciferol  2,000 Units Oral Daily   enoxaparin (LOVENOX) injection  40 mg Subcutaneous Q24H   entacapone  200 mg Oral 3 times per day   insulin aspart  0-9 Units Subcutaneous TID PC & HS   lisinopril  5 mg Oral Daily   methenamine  1,000 mg Oral BID   pantoprazole  40 mg Oral Daily   potassium chloride SA  10 mEq Oral BID   rOPINIRole  1.5 mg Oral QHS   rOPINIRole  3 mg Oral BID AC   tamsulosin  0.4 mg Oral BID   vitamin B-12  1,000 mcg Oral Daily   vitamin C  250 mg Oral BID   Continuous Infusions:  meropenem (MERREM) IV 1 g (05/01/22 0923)     LOS: 2 days     Domenic Polite, MD [facility]   05/01/2022, 11:37 AM

## 2022-05-01 NOTE — Plan of Care (Signed)

## 2022-05-01 NOTE — Evaluation (Addendum)
Physical Therapy Evaluation Patient Details [name] MRN: [id number] DOB: Oct 26, 1945 Today's Date: 05/01/2022  History of Present Illness  Patient is a 76 year old male who was recently admitted from 7/7 to 7/9 with UTI. patient has a history of recurrent UTIs with hospitalizations in March and May for UTI. patient was admitted this hospitalization with UTI due to extended sepctrum beta lactamase producing Escherichia coli and dysphagia. PMH: T12 spinous process fx, L1 ant-middle colum fx and sacral fx.  Clinical Impression  On eval, pt required Min A for mobility. He walked ~75 feet with a RW. He is at risk for falls when ambulating-exhibits common Parkinson's gait deficits: shuffling, festination. He tolerated activity well. Discussed d/c plan with pt/family-they are undecided at this time. They are open to [facility] rehab but they also stated they may go with home health depending on his progress. Will plan to follow and progress activity as tolerated.        Recommendations for follow up therapy are one component of a multi-disciplinary discharge planning process, led by the attending physician.  Recommendations may be updated based on patient status, additional functional criteria and insurance authorization.  Follow Up Recommendations Skilled nursing-short term rehab (<3 hours/day (depending on progress) Can patient physically be transported by private vehicle: Yes    Assistance Recommended at Discharge Frequent or constant Supervision/Assistance  Patient can return home with the following  A little help with walking and/or transfers;Assistance with cooking/housework;Assist for transportation;Help with stairs or ramp for entrance;A little help with bathing/dressing/bathroom    Equipment Recommendations None recommended by PT  Recommendations for Other Services       Functional Status Assessment Patient has had a recent decline in their functional status and demonstrates the ability  to make significant improvements in function in a reasonable and predictable amount of time.     Precautions / Restrictions Precautions Precautions: Fall Precaution Comments: back precautions for comfort Spinal Brace: Lumbar corset;Applied in sitting position Spinal Brace Comments: on when OOB Restrictions Weight Bearing Restrictions: No      Mobility  Bed Mobility               General bed mobility comments: oob in recliner    Transfers Overall transfer level: Needs assistance Equipment used: Rolling walker (2 wheels) Transfers: Sit to/from Stand Sit to Stand: Min assist           General transfer comment: Assist to rise, steady, control descent. Cues for safety, technique, hand placement.    Ambulation/Gait Ambulation/Gait assistance: Min assist Gait Distance (Feet): 75 Feet Assistive device: Rolling walker (2 wheels) Gait Pattern/deviations: Festinating, Shuffle, Trunk flexed       General Gait Details: Moderate cues for posture, RW proximity, step/stride length. Pt exhibits typical Parkinson's gait deficits: shuffling, festination, flexed posture. Assist to steady pt and manage RW throughout distance. Fall risk.  Stairs            Wheelchair Mobility    Modified Rankin (Stroke Patients Only)       Balance Overall balance assessment: Needs assistance, History of Falls         Standing balance support: Bilateral upper extremity supported, During functional activity, Reliant on assistive device for balance Standing balance-Leahy Scale: Poor                               Pertinent Vitals/Pain Pain Assessment Pain Assessment: Faces Faces Pain Scale: Hurts a  little bit Pain Location: back Pain Descriptors / Indicators: Discomfort, Sore Pain Intervention(s): Limited activity within patient's tolerance, Monitored during session, Repositioned    Home Living Family/patient expects to be discharged to:: Private residence Living  Arrangements: Spouse/significant other Available Help at Discharge: Available PRN/intermittently;Family Type of Home: House Home Access: Stairs to enter   [employer] of Steps: 5 steps   Home Layout: Able to live on main level with bedroom/bathroom;Laundry or work area in [place]: Kasandra Knudsen - single point;BSC/3in1;Shower seat - built in;Shower seat;Rolling Environmental consultant (2 wheels) Additional Comments: 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 still bends too much with reacher for pants and compression stockings with wife assist for these things since fall.     Hand Dominance   Dominant Hand: Right    Extremity/Trunk Assessment   Upper Extremity Assessment Upper Extremity Assessment: Defer to OT evaluation    Lower Extremity Assessment Lower Extremity Assessment: Generalized weakness    Cervical / Trunk Assessment Cervical / Trunk Assessment: Kyphotic Cervical / Trunk Exceptions: noted to have R lean when supine in bed and when sitting EOB. patient denies pain causing this  Communication   Communication: No difficulties  Cognition Arousal/Alertness: Awake/alert Behavior During Therapy: WFL for tasks assessed/performed Overall Cognitive Status: Impaired/Different from baseline Area of Impairment: Problem solving, Safety/judgement                         Safety/Judgement: Decreased awareness of deficits, Decreased awareness of safety   Problem Solving: Requires verbal cues          General Comments      Exercises     Assessment/Plan    PT Assessment Patient needs continued PT services  PT Problem List Decreased strength;Decreased mobility;Decreased activity tolerance;Decreased balance;Decreased knowledge of use of DME;Pain       PT Treatment Interventions DME  instruction;Therapeutic exercise;Gait training;Functional mobility training;Therapeutic activities;Patient/family education;Stair training    PT Goals (Current goals can be found in the Care Plan section)  Acute Rehab PT Goals Patient Stated Goal: pt/family are undecided about d/c plan at this time PT Goal Formulation: With patient/family Time For Goal Achievement: 05/15/22 Potential to Achieve Goals: Good    Frequency Min 3X/week     Co-evaluation               AM-PAC PT "6 Clicks" Mobility  Outcome Measure Help needed turning from your back to your side while in a flat bed without using bedrails?: A Little Help needed moving from lying on your back to sitting on the side of a flat bed without using bedrails?: A Little Help needed moving to and from a bed to a chair (including a wheelchair)?: A Little Help needed standing up from a chair using your arms (e.g., wheelchair or bedside chair)?: A Little Help needed to walk in hospital room?: A Little Help needed climbing 3-5 steps with a railing? : A Lot 6 Click Score: 17    End of Session Equipment Utilized During Treatment: Gait belt;Back brace Activity Tolerance: Patient tolerated treatment well Patient left: in chair;with call bell/phone within reach;with family/visitor present;with chair alarm set   PT Visit Diagnosis: Difficulty in walking, not elsewhere classified (R26.2);Muscle weakness (generalized) (M62.81);Other symptoms and signs involving the nervous  system (R29.898);History of falling (Z91.81)    Time: 1107-1150 PT Time Calculation (min) (ACUTE ONLY): 43 min   Charges:   PT Evaluation $PT Eval Moderate Complexity: 1 Mod PT Treatments $Gait Training: 8-22 mins $Therapeutic Activity: 8-22 mins          Doreatha Massed, PT Acute Rehabilitation  Office: [phone] Pager: [phone]

## 2022-05-01 NOTE — Progress Notes (Signed)
Subjective: He had a difficult night with poor sleep due to dyspnea and cough   Antibiotics:  Anti-infectives (From admission, onward)    Start     Dose/Rate Route Frequency Ordered Stop   04/30/22 0200  meropenem (MERREM) 1 g in sodium chloride 0.9 % 100 mL IVPB        1 g 200 mL/hr over 30 Minutes Intravenous Every 8 hours 04/29/22 2014     04/29/22 2200  methenamine (MANDELAMINE) tablet 1,000 mg       Note to Pharmacy: Take with any vitamin c, twice a day for uti prevention     1,000 mg Oral 2 times daily 04/29/22 2116     04/29/22 1645  cefTRIAXone (ROCEPHIN) 2 g in sodium chloride 0.9 % 100 mL IVPB  Status:  Discontinued        2 g 200 mL/hr over 30 Minutes Intravenous Every 24 hours 04/29/22 1640 04/29/22 1642   04/29/22 1645  meropenem (MERREM) 1 g in sodium chloride 0.9 % 100 mL IVPB        1 g 200 mL/hr over 30 Minutes Intravenous  Once 04/29/22 1644 04/29/22 1845       Medications: Scheduled Meds:  carbidopa-levodopa  3 tablet Oral 5 times per day   cholecalciferol  2,000 Units Oral Daily   enoxaparin (LOVENOX) injection  40 mg Subcutaneous Q24H   entacapone  200 mg Oral 3 times per day   insulin aspart  0-9 Units Subcutaneous TID PC & HS   lisinopril  5 mg Oral Daily   methenamine  1,000 mg Oral BID   pantoprazole  40 mg Oral Daily   potassium chloride SA  10 mEq Oral BID   rOPINIRole  1.5 mg Oral QHS   rOPINIRole  3 mg Oral BID AC   tamsulosin  0.4 mg Oral BID   vitamin B-12  1,000 mcg Oral Daily   vitamin C  250 mg Oral BID   Continuous Infusions:  meropenem (MERREM) IV 1 g (05/01/22 0923)   PRN Meds:.acetaminophen, guaiFENesin-dextromethorphan, naproxen sodium, traMADol    Objective: Weight change:   Intake/Output Summary (Last 24 hours) at 05/01/2022 1054 Last data filed at 05/01/2022 1053 Gross per 24 hour  Intake 594 ml  Output 975 ml  Net -381 ml   Blood pressure 131/77, pulse 92, temperature 99.3 F (37.4 C), temperature source  Oral, resp. rate 16, weight 86.2 kg, SpO2 94 %. Temp:  [98.4 F (36.9 C)-99.3 F (37.4 C)] 99.3 F (37.4 C) (07/18 0247) Pulse Rate:  [76-95] 92 (07/18 0247) Resp:  [16-23] 16 (07/18 0247) BP: (125-157)/(64-77) 131/77 (07/18 0247) SpO2:  [94 %-98 %] 94 % (07/18 0247)  Physical Exam: Physical Exam Constitutional:      Appearance: He is well-developed.  HENT:     Head: Normocephalic and atraumatic.  Eyes:     Conjunctiva/sclera: Conjunctivae normal.  Cardiovascular:     Rate and Rhythm: Normal rate and regular rhythm.  Pulmonary:     Effort: Pulmonary effort is normal. No respiratory distress.     Breath sounds: No wheezing.  Abdominal:     General: There is no distension.     Palpations: Abdomen is soft.  Musculoskeletal:        General: Normal range of motion.     Cervical back: Normal range of motion and neck supple.  Skin:    General: Skin is warm and dry.     Findings: No  erythema or rash.  Neurological:     Mental Status: He is alert and oriented to person, place, and time.  Psychiatric:        Mood and Affect: Mood normal.        Behavior: Behavior normal.        Thought Content: Thought content normal.        Judgment: Judgment normal.      CBC:    BMET Recent Labs    04/29/22 1640 05/01/22 0559  NA 137 134*  K 4.5 4.3  CL 104 101  CO2 20* 26  GLUCOSE 207* 153*  BUN 17 16  CREATININE 0.98 1.06  CALCIUM 8.9 8.7*     Liver Panel  Recent Labs    04/29/22 1640  PROT 6.8  ALBUMIN 3.3*  AST 10*  ALT 5  ALKPHOS 89  BILITOT 0.6       Sedimentation Rate No results for input(s): "ESRSEDRATE" in the last 72 hours. C-Reactive Protein No results for input(s): "CRP" in the last 72 hours.  Micro Results: Recent Results (from the past 720 hour(s))  Blood culture (routine x 2)     Status: None   Collection Time: 04/20/22  1:58 PM   Specimen: BLOOD  Result Value Ref Range Status   Specimen Description   Final    BLOOD SITE NOT  SPECIFIED Performed at [facility] [address], [place], [place] [address]    Special Requests   Final    BOTTLES DRAWN AEROBIC AND ANAEROBIC Blood Culture adequate volume Performed at [facility] [address] [place], [place] [address]    Culture   Final    NO GROWTH 5 DAYS Performed at [facility], [place] [address] [place], [place] [address]    Report Status 04/25/2022 FINAL  Final  Blood culture (routine x 2)     Status: None   Collection Time: 04/20/22  1:58 PM   Specimen: BLOOD  Result Value Ref Range Status   Specimen Description   Final    BLOOD SITE NOT SPECIFIED Performed at [facility] [address], [place], [place] [address]    Special Requests   Final    BOTTLES DRAWN AEROBIC AND ANAEROBIC Blood Culture adequate volume Performed at [facility] [address] [place], [place] [address]    Culture   Final    NO GROWTH 5 DAYS Performed at [facility], [place] [address] [place], [place] [address]    Report Status 04/25/2022 FINAL  Final  Urine Culture     Status: Abnormal   Collection Time: 04/20/22  7:19 PM   Specimen: Urine, Catheterized  Result Value Ref Range Status   Specimen Description   Final    URINE, CATHETERIZED Performed at [facility] [address] [place], [place] [address]    Special Requests   Final    NONE Performed at [facility], [place] [address] [place], [place] [address]    Culture (A)  Final    >=100,000 COLONIES/mL KLEBSIELLA PNEUMONIAE Confirmed Extended Spectrum Beta-Lactamase Producer (ESBL).  In bloodstream infections from ESBL organisms, carbapenems are preferred over piperacillin/tazobactam. They are shown to have a lower risk of mortality.    Report Status 04/23/2022 FINAL  Final   Organism ID, Bacteria KLEBSIELLA PNEUMONIAE (A)  Final      Susceptibility   Klebsiella pneumoniae - MIC*     AMPICILLIN >=32  RESISTANT Resistant     CEFAZOLIN >=64 RESISTANT Resistant     CEFEPIME >=32 RESISTANT Resistant     CEFTRIAXONE >=64 RESISTANT Resistant     CIPROFLOXACIN >=4 RESISTANT Resistant     GENTAMICIN >=16 RESISTANT Resistant     IMIPENEM 0.5 SENSITIVE Sensitive     NITROFURANTOIN 256 RESISTANT Resistant     TRIMETH/SULFA >=320 RESISTANT Resistant     AMPICILLIN/SULBACTAM >=32 RESISTANT Resistant     PIP/TAZO >=128 RESISTANT Resistant     * >=100,000 COLONIES/mL KLEBSIELLA PNEUMONIAE  Blood Culture (routine x 2)     Status: None (Preliminary result)   Collection Time: 04/29/22  4:37 PM   Specimen: BLOOD  Result Value Ref Range Status   Specimen Description   Final    BLOOD BLOOD LEFT HAND Performed at [facility] [address], [place], [place] [address]    Special Requests   Final    BOTTLES DRAWN AEROBIC ONLY Blood Culture results may not be optimal due to an inadequate volume of blood received in culture bottles Performed at [facility] [address], [place], [place] [address]    Culture   Final    NO GROWTH < 24 HOURS Performed at [facility] [address], [place], [place] [address]    Report Status PENDING  Incomplete  Resp Panel by RT-PCR (Flu A&B, Covid) Anterior Nasal Swab     Status: None   Collection Time: 04/29/22  4:40 PM   Specimen: Anterior Nasal Swab  Result Value Ref Range Status   SARS Coronavirus 2 by RT PCR NEGATIVE NEGATIVE Final    Comment: (NOTE) SARS-CoV-2 target nucleic acids are NOT DETECTED.  The SARS-CoV-2 RNA is generally detectable in upper respiratory specimens during the acute phase of infection. The lowest concentration of SARS-CoV-2 viral copies this assay can detect is 138 copies/mL. A negative result does not preclude SARS-Cov-2 infection and should not be used as the sole basis for treatment or other patient management decisions. A negative result may occur with  improper  specimen collection/handling, submission of specimen other than nasopharyngeal swab, presence of viral mutation(s) within the areas targeted by this assay, and inadequate number of viral copies(<138 copies/mL). A negative result must be combined with clinical observations, patient history, and epidemiological information. The expected result is Negative.  Fact Sheet for Patients:  EntrepreneurPulse.com.au  Fact Sheet for Healthcare Providers:  IncredibleEmployment.be  This test is no t yet approved or cleared by the [place] FDA and  has been authorized for detection and/or diagnosis of SARS-CoV-2 by FDA under an Emergency Use Authorization (EUA). This EUA will remain  in effect (meaning this test can be used) for the duration of the COVID-19 declaration under Section 564(b)(1) of the Act, 21 U.S.C.section 360bbb-3(b)(1), unless the authorization is terminated  or revoked sooner.       Influenza A by PCR NEGATIVE NEGATIVE Final   Influenza B by PCR NEGATIVE NEGATIVE Final    Comment: (NOTE) The Xpert Xpress SARS-CoV-2/FLU/RSV plus assay is intended as an aid in the diagnosis of influenza from Nasopharyngeal swab specimens and should not be used as a sole basis for treatment. Nasal washings and aspirates are unacceptable for Xpert Xpress SARS-CoV-2/FLU/RSV testing.  Fact Sheet for Patients: EntrepreneurPulse.com.au  Fact Sheet for Healthcare Providers: IncredibleEmployment.be  This test is not yet approved or cleared by the [place] FDA and has been authorized for detection and/or diagnosis of SARS-CoV-2 by FDA under an Emergency  Use Authorization (EUA). This EUA will remain in effect (meaning this test can be used) for the duration of the COVID-19 declaration under Section 564(b)(1) of the Act, 21 U.S.C. section 360bbb-3(b)(1), unless the authorization is terminated or revoked.  Performed at  [facility], [place] [address] [place], [place] [address]   Urine Culture     Status: Abnormal   Collection Time: 04/29/22  4:40 PM   Specimen: In/Out Cath Urine  Result Value Ref Range Status   Specimen Description   Final    IN/OUT CATH URINE Performed at [facility] [address] [place], [place] [address]    Special Requests   Final    NONE Performed at [facility], [employer] [address] [place], [place] [address]    Culture (A)  Final    >=100,000 COLONIES/mL KLEBSIELLA PNEUMONIAE Confirmed Extended Spectrum Beta-Lactamase Producer (ESBL).  In bloodstream infections from ESBL organisms, carbapenems are preferred over piperacillin/tazobactam. They are shown to have a lower risk of mortality.    Report Status 05/01/2022 FINAL  Final   Organism ID, Bacteria KLEBSIELLA PNEUMONIAE (A)  Final      Susceptibility   Klebsiella pneumoniae - MIC*    AMPICILLIN >=32 RESISTANT Resistant     CEFAZOLIN >=64 RESISTANT Resistant     CEFEPIME >=32 RESISTANT Resistant     CEFTRIAXONE >=64 RESISTANT Resistant     CIPROFLOXACIN >=4 RESISTANT Resistant     GENTAMICIN >=16 RESISTANT Resistant     IMIPENEM 0.5 SENSITIVE Sensitive     NITROFURANTOIN 256 RESISTANT Resistant     TRIMETH/SULFA >=320 RESISTANT Resistant     AMPICILLIN/SULBACTAM >=32 RESISTANT Resistant     PIP/TAZO >=128 RESISTANT Resistant     * >=100,000 COLONIES/mL KLEBSIELLA PNEUMONIAE  Blood Culture (routine x 2)     Status: None (Preliminary result)   Collection Time: 04/29/22  6:00 PM   Specimen: BLOOD  Result Value Ref Range Status   Specimen Description   Final    BLOOD LEFT ANTECUBITAL Performed at [facility] [address] [place], [place] [address]    Special Requests   Final    BOTTLES DRAWN AEROBIC AND ANAEROBIC Blood Culture adequate volume Performed at [facility] [address], [place], [place] [address]     Culture   Final    NO GROWTH < 12 HOURS Performed at [facility] [address], [place], [place] [address]    Report Status PENDING  Incomplete    Studies/Results: DG CHEST PORT 1 VIEW  Result Date: 04/30/2022 CLINICAL DATA:  Shortness of breath. EXAM: PORTABLE CHEST 1 VIEW COMPARISON:  04/29/2022. FINDINGS: Examination is limited due to patient rotation. The heart is enlarged and the mediastinal contour is stable. Lung volumes are low with patchy airspace disease at the lung bases. There are hazy opacities at the lung bases in the possibility of pleural effusions can not be excluded. No definite pneumothorax. No acute osseous abnormality. IMPRESSION: 1. Patchy airspace disease and hazy opacification at the lung bases, possible atelectasis or infiltrate. The possibility of small pleural effusions can not be excluded. 2. Cardiomegaly. Electronically Signed   By: Brett Fairy M.D.   On: 04/30/2022 23:44   DG Chest Port 1 View  Result Date: 04/29/2022 CLINICAL DATA:  Possible sepsis EXAM: PORTABLE CHEST 1 VIEW COMPARISON:  04/20/2022 FINDINGS: Transverse diameter of the heart is in the upper limits of normal. There are no signs of pulmonary edema  or focal pulmonary consolidation. Left hemidiaphragm is elevated. There is no pleural effusion or pneumothorax. IMPRESSION: No active disease. Electronically Signed   By: Elmer Picker M.D.   On: 04/29/2022 17:27      Assessment/Plan:  INTERVAL HISTORY: Dyspnea overnight chest x-ray rotated showing pleural effusion potential   Principal Problem:   UTI due to extended-spectrum beta lactamase (ESBL) producing Escherichia coli Active Problems:   Parkinson disease ([place])   Diabetes ([place])   BPH (benign prostatic hyperplasia)   HTN (hypertension)   Dysphagia    [name] is a 76 y.o. 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  ESBL urinary tract infection: Continue meropenem for now  Consider long-term antibiotic solution for this problem.  I do agree with prudence with not overreacting to cultures and over prescribing antibiotics it sounds like his most recent admission was related to an actual urinary tract infection.  I have discussed the case with primary Dr. Broadus John who is checking postvoid residual to see if the patient may need catheterization to help with urinary flow and this might be a part of the problem.  Family member would like Urology involved and. Dr Broadus John to reach out to Urology post study   2. Pulmonary edema: agree with diuresis. I do NOT think he has a pneumonia     LOS: 2 days   Alcide Evener 05/01/2022, 10:54 AM

## 2022-05-01 NOTE — Consult Note (Signed)
Consultation Note Date: 05/01/2022   Patient [name]  DOB: Mar 11, 1946  MRN: [id number]  Age / Sex: 76 y.o., male  PCP: Josetta Huddle, MD Referring Physician: Domenic Polite, MD  Reason for Consultation: Establishing goals of care and Psychosocial/spiritual support  HPI/Patient Profile: 76 y.o. 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    He was last admitted on 04/20/2022 - 04/22/2022 for progressive malaise and was treated with IV meropenem for 2 days but was discontinued by ID due to absence of localizing symptoms of UTI, fever or leukocytosis.   Family report continued physical, functional and cognitive decline since that stabilization worsening over the weekend.   He is followed by ID for recurrent ESBL UTI and on Macrobid prophylaxis.   In the ED, he was febrile to 101.47F, intermittently hypertensive and on room air. No leukocytosis.    UA shows large leukocyte, positive nitrite and many bacteria.  Chest x-ray was negative.  Admitted for treatment stabilization.  Patient and family face treatment option decision, advanced  directive decisions and anticipatory care needs.   Clinical Assessment and Goals of Care:  This NP Wadie Lessen reviewed medical records, received report from team, assessed the patient and then meet at the patient's bedside along with his daughter/[name] to discuss diagnosis, GOC, disposition and options.   Concept of Palliative Care was introduced as specialized medical care for people and their families living with serious illness.  If focuses on providing relief from the symptoms and stress of a serious  illness.  The goal is to improve quality of life for both the patient and the family.  Values and goals of care important to patient and family were attempted to be elicited.  Created space and opportunity for patient  and family to explore thoughts and feelings regarding current medical situation.  Patient and family  are frustrated with continued occurring UTI and associated weakness and overall decline.  Patient has decline over the past many weeks, with increased personal care needs needs in the home.  Patient lives at home with his wife of 42 years.  He has 2 sons who live nearby and the daughter who lives 2 hours away in [place].  Patient does have a long-term care policy and we discussed exploration of the benefit.  Patient is open to SNF for short-term rehab.  Ultimately hope is for increased strength and independence and discharge home.     A  discussion was had today regarding advanced directives.  Stressed the importance of documentation of advance care planning and on going conversation with family and providers to ensure decisions and plan of care are within the context of the patient's goals and priorities.  Questions and concerns addressed.  Patient  encouraged to call with questions or concerns.     PMT will continue  to support holistically.         No documented healthcare power of attorney or advanced  care planning documents noted. Patient tells me he has them at home and will bring them in for scanning.   Wife is next of kin/main decision maker in the event the patient cannot speak for himself.    SUMMARY OF RECOMMENDATIONS    Code Status/Advance Care Planning: Full code Patient is open to all offered and available medical interventions to prolong quality of life.  Patient and family are hopeful for improvement    Additional Recommendations (Limitations, Scope, Preferences): Full Scope Treatment  Psycho-social/Spiritual:  Desire for further Chaplaincy  support:no   Prognosis:  Unable to determine  Discharge Planning: To Be Determined      Primary Diagnoses: Present on Admission:  UTI due to extended-spectrum beta lactamase (ESBL) producing Escherichia coli  Parkinson disease (HCC)  HTN (hypertension)  BPH (benign prostatic hyperplasia)   I have reviewed the medical record, interviewed the patient and family, and examined the patient. The following aspects are pertinent.  Past Medical History:  Diagnosis Date   Abnormal involuntary movement 02/18/2018   Allergic rhinitis    Arthritis    BPH (benign prostatic hyperplasia)    Diabetes ([place])    Dysphagia, pharyngeal phase    GERD diagnosed on barium swallow. Has small hiatal hernia. Symptomatically somewhat better on omeprazole but not entirely. We'll try b.i.d. therapy   Edema    1+ in both ankles, likely multifactorial including medication such as Requip   Erectile dysfunction    Staxyn 10 mg or Viagra worked well. 3 samples of Cialis 20 mg provided   GERD (gastroesophageal reflux disease)    Hypercholesteremia    Hypertension    Nephrolithiasis    Onychomycosis of toenail    April 27, 2013 - Dr. Inocencio Homes - podiatry, was in [place] - treating with oral Lamisil and topical nail therapy   Parkinson's disease [facility])     followed by Dr. Lezlie Octave at [facility] and [name], M.D. in [place]   Presbycusis    and tinnitus - Dr. Izora Gala - August/2013   Renal calculus    Syncope    Social History   Socioeconomic History   Marital status: Married    Spouse name: Not on file   Number of children: Not on file   Years of education: Not on file   Highest education level: Not on file  Occupational History   Not on file  Tobacco Use   Smoking status: Never   Smokeless tobacco: Never  Vaping Use   Vaping Use: Never used  Substance and Sexual Activity   Alcohol use: No   Drug use: No   Sexual activity: Not on file  Other Topics Concern   Not on file  Social  History Narrative   Right Handed    Lives 3 story home   Social Determinants of Health   Financial Resource Strain: Not on file  Food Insecurity: Not on file  Transportation Needs: Not on file  Physical Activity: Not on file  Stress: Not on file  Social Connections: Not on file   Family History  Problem Relation Age of Onset   Atrial fibrillation Mother    Breast cancer Mother    Emphysema Father    Heart disease Father    Cancer Brother        [place]   Scheduled Meds:  carbidopa-levodopa  3 tablet Oral 5 times  per day   cholecalciferol  2,000 Units Oral Daily   enoxaparin (LOVENOX) injection  40 mg Subcutaneous Q24H   entacapone  200 mg Oral 3 times per day   insulin aspart  0-9 Units Subcutaneous TID PC & HS   lisinopril  5 mg Oral Daily   methenamine  1,000 mg Oral BID   pantoprazole  40 mg Oral Daily   potassium chloride SA  10 mEq Oral BID   rOPINIRole  1.5 mg Oral QHS   rOPINIRole  3 mg Oral BID AC   tamsulosin  0.4 mg Oral BID   vitamin B-12  1,000 mcg Oral Daily   vitamin C  250 mg Oral BID   Continuous Infusions:  meropenem (MERREM) IV 1 g (05/01/22 0923)   PRN Meds:.acetaminophen, guaiFENesin-dextromethorphan, naproxen sodium, traMADol Medications Prior to Admission:  Prior to Admission medications   Medication Sig Start Date End Date Taking? Authorizing Provider  acetaminophen (TYLENOL) 500 MG tablet Take 1,000 mg by mouth every 6 (six) hours as needed (for pain).   Yes [provider]  ALEVE 220 MG tablet Take 660 mg by mouth every 6 (six) hours as needed (for pain).   Yes [provider]  amoxicillin (AMOXIL) 500 MG capsule Take 2,000 mg by mouth See admin instructions. Take 2,000 mg by mouth one hour prior to dental appointments   Yes [provider]  Ascorbic Acid (VITAMIN C) 100 MG tablet Take 100 mg by mouth in the morning and at bedtime.   Yes [provider]  calcium carbonate (TUMS) 500 MG chewable  tablet Chew 2 tablets by mouth daily as needed for indigestion or heartburn.   Yes [provider]  carbidopa-levodopa (SINEMET IR) 25-100 MG tablet Take 3 tablets by mouth 4 (four) times daily. Patient taking differently: Take 3 tablets by mouth See admin instructions. Take 3 tablets by mouth at 6 AM, 9 AM, 12 NOON, 3 PM, and 6 PM, but can sometimes average only a sum total of 13 tablets/day 03/23/22  Yes [name], [name], DO  cholecalciferol (VITAMIN D) 1000 UNITS tablet Take 2,000 Units by mouth daily.   Yes [provider]  clotrimazole (LOTRIMIN AF) 1 % cream Apply 1 application. topically 2 (two) times daily. Patient taking differently: Apply 1 application  topically 2 (two) times daily as needed (for yeast infections resulting from taking antibiotics). 03/13/22  Yes Vu, [name], MD  entacapone (COMTAN) 200 MG tablet TAKE 1 TABLET BY MOUTH 3 TIMES DAILY (WITH FIRST THREE DOSAGES OF CARBIDOPA/LEVODOPA). Patient taking differently: Take 200 mg by mouth See admin instructions. Take 200 mg by mouth at 6 AM, 9 AM, and 12 NOON 04/02/22  Yes [name], [name], DO  furosemide (LASIX) 20 MG tablet Take 2 tablets (40 mg total) by mouth daily. Patient taking differently: Take 20 mg by mouth daily as needed for fluid or edema. 03/07/22  Yes [name], MD  hydrocortisone cream 0.5 % Apply 1 application. topically 2 (two) times daily. Patient taking differently: Apply 1 application  topically 2 (two) times daily as needed for itching. 03/13/22  Yes Vu, [name], MD  LANTUS SOLOSTAR 100 UNIT/ML Solostar Pen Inject 15-20 Units into the skin at bedtime. 05/03/21  Yes [provider]  lisinopril (ZESTRIL) 5 MG tablet Take 5 mg by mouth daily. 12/06/21  Yes [provider]  metFORMIN (GLUCOPHAGE) 500 MG tablet Take 500 mg by mouth 2 (two) times daily with a meal.   Yes [provider]  methenamine (MANDELAMINE) 1 g tablet Take 1 tablet (1,000 mg total) by mouth 2 (two) times daily.  Take with any vitamin c, twice a day for uti prevention Patient taking differently: Take 1,000 mg by mouth in the morning and at bedtime. 03/13/22 03/08/23 Yes Vu, [name], MD  nitrofurantoin, macrocrystal-monohydrate, (MACROBID) 100 MG capsule Take 1 capsule (100 mg total) by mouth daily. Patient taking differently: Take 100 mg by mouth 2 (two) times daily. 03/28/22 03/23/23 Yes Vu, [name], MD  omeprazole (PRILOSEC) 20 MG capsule Take 20 mg by mouth daily as needed (acid reflux).   Yes [provider]  potassium chloride SA (KLOR-CON) 20 MEQ tablet Take 10 mEq by mouth 2 (two) times daily. 06/16/21  Yes [provider]  rOPINIRole (REQUIP) 3 MG tablet Take 1 tablet in the morning, Take  1 in the afternoon, Take a  half tablet in the evening Patient taking differently: Take 1.5-3 mg by mouth See admin instructions. Take 3 mg by mouth in the morning and afternoon. Take 1.5 mg in the evening. 02/27/22  Yes [name], [name], DO  tamsulosin (FLOMAX) 0.4 MG CAPS capsule Take 0.4 mg by mouth in the morning and at bedtime. 01/24/18  Yes [provider]  traMADol (ULTRAM) 50 MG tablet Take 50 mg by mouth every 6 (six) hours as needed (for pain).   Yes [provider]  triamcinolone (NASACORT) 55 MCG/ACT AERO nasal inhaler Place 2 sprays into the nose daily as needed (for allergies or rhinitis).   Yes [provider]  vitamin B-12 (CYANOCOBALAMIN) 1000 MCG tablet Take 1,000 mcg by mouth daily.   Yes [provider]  GLOBAL EASE INJECT PEN NEEDLES 31G X 5 MM MISC Inject into the skin. 05/09/21   [provider]   Allergies  Allergen Reactions   Nucynta [Tapentadol] Other (See Comments)    Made the patient not feel well. "He did not feel well at all."   Oxycodone Itching   Sudafed [Pseudoephedrine Hcl] Other (See Comments)    Problems urinating    Tramadol Hcl Itching and Other (See Comments)    Can tolerate, however (in 2023)   Review of Systems   Constitutional:  Positive for appetite change.  Neurological:  Positive for weakness.    Physical Exam Cardiovascular:     Rate and Rhythm: Normal rate.  Pulmonary:     Effort: Pulmonary effort is normal.  Skin:    General: Skin is warm and dry.  Neurological:     Mental Status: He is alert and oriented to person, place, and time.     Vital Signs: BP 131/77 (BP Location: Left Arm)   Pulse 92   Temp 99.3 F (37.4 C) (Oral)   Resp 16   Wt 86.2 kg   SpO2 94%   BMI 25.77 kg/m  Pain Scale: 0-10 POSS *See Group Information*: 1-Acceptable,Awake and alert Pain Score: 3    SpO2: SpO2: 94 % O2 Device:SpO2: 94 % O2 Flow Rate: .   IO: Intake/output summary:  Intake/Output Summary (Last 24 hours) at 05/01/2022 1043 Last data filed at 04/30/2022 2230 Gross per 24 hour  Intake 240 ml  Output 500 ml  Net -260 ml    LBM: Last BM Date : 04/28/22 Baseline Weight: Weight: 86.2 kg Most recent weight: Weight: 86.2 kg     Palliative Assessment/Data: 40%     Discussed with Dr. Broadus John  PMT will continue to support holistically   Signed by:  Wadie Lessen, NP   Please contact Palliative Medicine Team phone at [phone] for questions and concerns.  For individual provider: See Shea Evans

## 2022-05-02 DIAGNOSIS — I10 Essential (primary) hypertension: Secondary | ICD-10-CM | POA: Diagnosis not present

## 2022-05-02 DIAGNOSIS — R531 Weakness: Secondary | ICD-10-CM | POA: Diagnosis not present

## 2022-05-02 DIAGNOSIS — Z515 Encounter for palliative care: Secondary | ICD-10-CM

## 2022-05-02 DIAGNOSIS — N400 Enlarged prostate without lower urinary tract symptoms: Secondary | ICD-10-CM | POA: Diagnosis not present

## 2022-05-02 DIAGNOSIS — N390 Urinary tract infection, site not specified: Secondary | ICD-10-CM | POA: Diagnosis not present

## 2022-05-02 DIAGNOSIS — G20 Parkinson's disease: Secondary | ICD-10-CM | POA: Diagnosis not present

## 2022-05-02 LAB — BASIC METABOLIC PANEL
Anion gap: 5 (ref 5–15)
BUN: 20 mg/dL (ref 8–23)
CO2: 28 mmol/L (ref 22–32)
Calcium: 8.3 mg/dL — ABNORMAL LOW (ref 8.9–10.3)
Chloride: 97 mmol/L — ABNORMAL LOW (ref 98–111)
Creatinine, Ser: 1.12 mg/dL (ref 0.61–1.24)
GFR, Estimated: >60 mL/min (ref >60)
Glucose, Bld: 206 mg/dL — ABNORMAL HIGH (ref 70–99)
Potassium: 4.2 mmol/L (ref 3.5–5.1)
Sodium: 130 mmol/L — ABNORMAL LOW (ref 135–145)

## 2022-05-02 LAB — CBC
HCT: 37.6 % — ABNORMAL LOW (ref 39.0–52.0)
Hemoglobin: 12.4 g/dL — ABNORMAL LOW (ref 13.0–17.0)
MCH: 30 pg (ref 26.0–34.0)
MCHC: 33 g/dL (ref 30.0–36.0)
MCV: 90.8 fL (ref 80.0–100.0)
Platelets: 232 10*3/uL (ref 150–400)
RBC: 4.14 MIL/uL — ABNORMAL LOW (ref 4.22–5.81)
RDW: 14.1 % (ref 11.5–15.5)
WBC: 6.5 10*3/uL (ref 4.0–10.5)
nRBC: 0 % (ref 0.0–0.2)

## 2022-05-02 LAB — GLUCOSE, CAPILLARY
Glucose-Capillary: 177 mg/dL — ABNORMAL HIGH (ref 70–99)
Glucose-Capillary: 196 mg/dL — ABNORMAL HIGH (ref 70–99)
Glucose-Capillary: 207 mg/dL — ABNORMAL HIGH (ref 70–99)
Glucose-Capillary: 226 mg/dL — ABNORMAL HIGH (ref 70–99)

## 2022-05-02 MED ORDER — DOCUSATE SODIUM 100 MG PO CAPS
100.0000 mg | ORAL_CAPSULE | Freq: Two times a day (BID) | ORAL | Status: DC
  Administered 2022-05-02 – 2022-05-08 (×13): 100 mg via ORAL
  Filled 2022-05-02 (×13): qty 1

## 2022-05-02 NOTE — Progress Notes (Signed)
Patient ID: PAYAM GRIBBLE, male   DOB: [birth year], 76 y.o.   MRN: [id number]    Progress Note from the Palliative Medicine Team at [facility]   Patient [name]        Date: 05/02/2022 DOB: 04-29-1946  Age: 76 y.o. MRN#: [id number] Attending Physician: Kathie Dike, MD Primary Care Physician: Josetta Huddle, MD Admit Date: 04/29/2022   Medical records reviewed   76 y.o. 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    He was last admitted on 04/20/2022 - 04/22/2022 for progressive malaise and was treated with IV meropenem for 2 days but was discontinued by ID due to absence of localizing symptoms of UTI, fever or leukocytosis.    Family report continued physical, functional and cognitive decline since that stabilization worsening over the weekend.   He is followed by ID for recurrent ESBL UTI and on Macrobid prophylaxis.   In the ED, he was febrile to 101.25F, intermittently hypertensive and on room air. No leukocytosis.    UA shows large leukocyte, positive nitrite and many bacteria.  Chest x-ray was negative.   Admitted for treatment stabilization.   Patient and family face treatment option decision, advanced  directive decisions and anticipatory care needs.   This NP assessed patient at the bedside as a follow up to  yesterday's [place], wife and daughter at the bedside.   Continue conversation regarding current medical situation; multiple comorbidities specific to the natural trajectory of Parkinson's disease and patient's ongoing urinary retention with persistent recurring infection.  Patient and family pleased that  urology consult was completed yesterday along with continued input from infectious disease.  Patient and  family plan to continue follow-up with both specialties on discharge.  Education today regarding  the importance of continued conversation with family and the medical providers regarding overall plan of care and treatment options,  ensuring decisions are within the context of the patients values and GOCs.  MOST form introduced, blank copy left with patient and family to review and complete when they feel comfortable.  Education offered specifically today to [place], patient's immediate response was for documentation of DNR/DNI however his daughter was not comfortable with that.    Patient and family will continue discussion, they understand the importance of documentation of advance care planning documents, and they will follow through with providers in the future  Plan of care -Full code -Treat the treatable,Patient is open to all offered and available medical interventions to prolong quality of life -Skilled nursing facility for short-term rehab--[facility] offered a bed  Questions and concerns addressed   Discussed with Dr Roderic Palau    Wadie Lessen NP  Palliative Medicine Team Team Phone # [phone] Pager [phone]

## 2022-05-02 NOTE — TOC Progression Note (Signed)
Transition of Care [facility]) - Progression Note    Patient Details  Name: LODEN LAURENT MRN: [id number] Date of Birth: 12/21/1945  Transition of Care [facility]) CM/SW [place], [place] Phone Number: 05/02/2022, 9:20 AM  Clinical Narrative:    Pt has accepted bed offer for [facility] in [place]. Insurance Josem Kaufmann #[id number] has been approved starting 05/03/22 thru 05/07/2022. Pt is able to transfer to SNF when medically ready.    Expected Discharge Plan: [place] Barriers to Discharge: Continued Medical Work up  Expected Discharge Plan and Services Expected Discharge Plan: [place] In-house Referral: Clinical Social Work Discharge Planning Services: CM Consult Post Acute Care Choice: [place] Living arrangements for the past 2 months: Single Family Home                 DME Arranged: N/A DME Agency: NA                   Social Determinants of Health (SDOH) Interventions    Readmission Risk Interventions    05/01/2022    2:36 PM  Readmission Risk Prevention Plan  Transportation Screening Complete  PCP or Specialist Appt within 5-7 Days Complete  Home Care Screening Complete  Medication Review (RN CM) Complete

## 2022-05-02 NOTE — Progress Notes (Signed)
Subjective:  No new complaints   Antibiotics:  Anti-infectives (From admission, onward)    Start     Dose/Rate Route Frequency Ordered Stop   04/30/22 0200  meropenem (MERREM) 1 g in sodium chloride 0.9 % 100 mL IVPB        1 g 200 mL/hr over 30 Minutes Intravenous Every 8 hours 04/29/22 2014     04/29/22 2200  methenamine (MANDELAMINE) tablet 1,000 mg       Note to Pharmacy: Take with any vitamin c, twice a day for uti prevention     1,000 mg Oral 2 times daily 04/29/22 2116     04/29/22 1645  cefTRIAXone (ROCEPHIN) 2 g in sodium chloride 0.9 % 100 mL IVPB  Status:  Discontinued        2 g 200 mL/hr over 30 Minutes Intravenous Every 24 hours 04/29/22 1640 04/29/22 1642   04/29/22 1645  meropenem (MERREM) 1 g in sodium chloride 0.9 % 100 mL IVPB        1 g 200 mL/hr over 30 Minutes Intravenous  Once 04/29/22 1644 04/29/22 1845       Medications: Scheduled Meds:  carbidopa-levodopa  3 tablet Oral 5 times per day   cholecalciferol  2,000 Units Oral Daily   docusate sodium  100 mg Oral BID   enoxaparin (LOVENOX) injection  40 mg Subcutaneous Q24H   entacapone  200 mg Oral 3 times per day   insulin aspart  0-9 Units Subcutaneous TID PC & HS   lisinopril  5 mg Oral Daily   methenamine  1,000 mg Oral BID   pantoprazole  40 mg Oral Daily   potassium chloride SA  10 mEq Oral BID   rOPINIRole  1.5 mg Oral QHS   rOPINIRole  3 mg Oral BID AC   tamsulosin  0.4 mg Oral BID   vitamin B-12  1,000 mcg Oral Daily   vitamin C  250 mg Oral BID   Continuous Infusions:  meropenem (MERREM) IV 200 mL/hr at 05/02/22 0800   PRN Meds:.acetaminophen, guaiFENesin-dextromethorphan, traMADol    Objective: Weight change:   Intake/Output Summary (Last 24 hours) at 05/02/2022 1216 Last data filed at 05/02/2022 0900 Gross per 24 hour  Intake 1548 ml  Output 1754 ml  Net -206 ml    Blood pressure (!) 147/74, pulse 98, temperature 98.8 F (37.1 C), temperature source Oral, resp.  rate (!) 22, height 6' (1.829 m), weight 86.2 kg, SpO2 100 %. Temp:  [98.3 F (36.8 C)-98.8 F (37.1 C)] 98.8 F (37.1 C) (07/19 0936) Pulse Rate:  [68-98] 98 (07/19 0936) Resp:  [16-28] 22 (07/19 0936) BP: (125-147)/(62-84) 147/74 (07/19 0936) SpO2:  [98 %-100 %] 100 % (07/19 0310) Weight:  [86.2 kg] 86.2 kg (07/19 0936)  Physical Exam: Physical Exam Constitutional:      Appearance: He is well-developed.  HENT:     Head: Normocephalic and atraumatic.  Eyes:     Conjunctiva/sclera: Conjunctivae normal.  Cardiovascular:     Rate and Rhythm: Normal rate and regular rhythm.  Pulmonary:     Effort: Pulmonary effort is normal. No respiratory distress.     Breath sounds: No wheezing.  Abdominal:     General: There is no distension.     Palpations: Abdomen is soft.  Musculoskeletal:        General: Normal range of motion.     Cervical back: Normal range of motion and neck supple.  Skin:  General: Skin is warm and dry.     Findings: No erythema or rash.  Neurological:     Mental Status: He is alert and oriented to person, place, and time.  Psychiatric:        Mood and Affect: Mood normal.        Behavior: Behavior normal.        Thought Content: Thought content normal.        Judgment: Judgment normal.      CBC:    BMET Recent Labs    05/01/22 0559 05/02/22 0539  NA 134* 130*  K 4.3 4.2  CL 101 97*  CO2 26 28  GLUCOSE 153* 206*  BUN 16 20  CREATININE 1.06 1.12  CALCIUM 8.7* 8.3*      Liver Panel  Recent Labs    04/29/22 1640  PROT 6.8  ALBUMIN 3.3*  AST 10*  ALT 5  ALKPHOS 89  BILITOT 0.6        Sedimentation Rate No results for input(s): "ESRSEDRATE" in the last 72 hours. C-Reactive Protein No results for input(s): "CRP" in the last 72 hours.  Micro Results: Recent Results (from the past 720 hour(s))  Blood culture (routine x 2)     Status: None   Collection Time: 04/20/22  1:58 PM   Specimen: BLOOD  Result Value Ref Range Status    Specimen Description   Final    BLOOD SITE NOT SPECIFIED Performed at [facility] [address] [place], [place] [address]    Special Requests   Final    BOTTLES DRAWN AEROBIC AND ANAEROBIC Blood Culture adequate volume Performed at [facility] [address], [place], [place] [address]    Culture   Final    NO GROWTH 5 DAYS Performed at [facility], [place] [address], [place], [place] [address]    Report Status 04/25/2022 FINAL  Final  Blood culture (routine x 2)     Status: None   Collection Time: 04/20/22  1:58 PM   Specimen: BLOOD  Result Value Ref Range Status   Specimen Description   Final    BLOOD SITE NOT SPECIFIED Performed at [facility] [address] [place], [place] [address]    Special Requests   Final    BOTTLES DRAWN AEROBIC AND ANAEROBIC Blood Culture adequate volume Performed at [facility] [address], [place], [place] [address]    Culture   Final    NO GROWTH 5 DAYS Performed at [facility], [place] [address] [place], [place] [address]    Report Status 04/25/2022 FINAL  Final  Urine Culture     Status: Abnormal   Collection Time: 04/20/22  7:19 PM   Specimen: Urine, Catheterized  Result Value Ref Range Status   Specimen Description   Final    URINE, CATHETERIZED Performed at [facility] [address], [place], [place] [address]    Special Requests   Final    NONE Performed at [facility], [place] [address] [place], [place] [address]    Culture (A)  Final    >=100,000 COLONIES/mL KLEBSIELLA PNEUMONIAE Confirmed Extended Spectrum Beta-Lactamase Producer (ESBL).  In bloodstream infections from ESBL organisms, carbapenems are preferred over piperacillin/tazobactam. They are shown to have a lower risk of mortality.    Report Status 04/23/2022 FINAL  Final   Organism ID, Bacteria KLEBSIELLA PNEUMONIAE (A)  Final  Susceptibility   Klebsiella pneumoniae - MIC*    AMPICILLIN >=32 RESISTANT Resistant     CEFAZOLIN >=64 RESISTANT Resistant     CEFEPIME >=32 RESISTANT Resistant     CEFTRIAXONE >=64 RESISTANT Resistant     CIPROFLOXACIN >=4 RESISTANT Resistant     GENTAMICIN >=16 RESISTANT Resistant     IMIPENEM 0.5 SENSITIVE Sensitive     NITROFURANTOIN 256 RESISTANT Resistant     TRIMETH/SULFA >=320 RESISTANT Resistant     AMPICILLIN/SULBACTAM >=32 RESISTANT Resistant     PIP/TAZO >=128 RESISTANT Resistant     * >=100,000 COLONIES/mL KLEBSIELLA PNEUMONIAE  Blood Culture (routine x 2)     Status: None (Preliminary result)   Collection Time: 04/29/22  4:37 PM   Specimen: BLOOD  Result Value Ref Range Status   Specimen Description   Final    BLOOD BLOOD LEFT HAND Performed at [facility] [address] [place], [place] [address]    Special Requests   Final    BOTTLES DRAWN AEROBIC ONLY Blood Culture results may not be optimal due to an inadequate volume of blood received in culture bottles Performed at [facility] [address] [place], [place] [address]    Culture   Final    NO GROWTH 3 DAYS Performed at [facility], [place] [address], [place],  [address]    Report Status PENDING  Incomplete  Resp Panel by RT-PCR (Flu A&B, Covid) Anterior Nasal Swab     Status: None   Collection Time: 04/29/22  4:40 PM   Specimen: Anterior Nasal Swab  Result Value Ref Range Status   SARS Coronavirus 2 by RT PCR NEGATIVE NEGATIVE Final    Comment: (NOTE) SARS-CoV-2 target nucleic acids are NOT DETECTED.  The SARS-CoV-2 RNA is generally detectable in upper respiratory specimens during the acute phase of infection. The lowest concentration of SARS-CoV-2 viral copies this assay can detect is 138 copies/mL. A negative result does not preclude SARS-Cov-2 infection and should not be used as the sole basis for treatment or other patient management  decisions. A negative result may occur with  improper specimen collection/handling, submission of specimen other than nasopharyngeal swab, presence of viral mutation(s) within the areas targeted by this assay, and inadequate number of viral copies(<138 copies/mL). A negative result must be combined with clinical observations, patient history, and epidemiological information. The expected result is Negative.  Fact Sheet for Patients:  EntrepreneurPulse.com.au  Fact Sheet for Healthcare Providers:  IncredibleEmployment.be  This test is no t yet approved or cleared by the [place] FDA and  has been authorized for detection and/or diagnosis of SARS-CoV-2 by FDA under an Emergency Use Authorization (EUA). This EUA will remain  in effect (meaning this test can be used) for the duration of the COVID-19 declaration under Section 564(b)(1) of the Act, 21 U.S.C.section 360bbb-3(b)(1), unless the authorization is terminated  or revoked sooner.       Influenza A by PCR NEGATIVE NEGATIVE Final   Influenza B by PCR NEGATIVE NEGATIVE Final    Comment: (NOTE) The Xpert Xpress SARS-CoV-2/FLU/RSV plus assay is intended as an aid in the diagnosis of influenza from Nasopharyngeal swab specimens and should not be used as a sole basis for treatment. Nasal washings and aspirates are unacceptable for Xpert Xpress SARS-CoV-2/FLU/RSV testing.  Fact Sheet for Patients: EntrepreneurPulse.com.au  Fact Sheet for Healthcare Providers: IncredibleEmployment.be  This test is not yet approved or cleared by the [place] and has been authorized  for detection and/or diagnosis of SARS-CoV-2 by FDA under an Emergency Use Authorization (EUA). This EUA will remain in effect (meaning this test can be used) for the duration of the COVID-19 declaration under Section 564(b)(1) of the Act, 21 U.S.C. section 360bbb-3(b)(1), unless the  authorization is terminated or revoked.  Performed at [facility], [place] [address] [place], [place] [address]   Urine Culture     Status: Abnormal   Collection Time: 04/29/22  4:40 PM   Specimen: In/Out Cath Urine  Result Value Ref Range Status   Specimen Description   Final    IN/OUT CATH URINE Performed at [facility] [address] [place], [place] [address]    Special Requests   Final    NONE Performed at [facility], [place] [address], [place], [place] [address]    Culture (A)  Final    >=100,000 COLONIES/mL KLEBSIELLA PNEUMONIAE Confirmed Extended Spectrum Beta-Lactamase Producer (ESBL).  In bloodstream infections from ESBL organisms, carbapenems are preferred over piperacillin/tazobactam. They are shown to have a lower risk of mortality.    Report Status 05/01/2022 FINAL  Final   Organism ID, Bacteria KLEBSIELLA PNEUMONIAE (A)  Final      Susceptibility   Klebsiella pneumoniae - MIC*    AMPICILLIN >=32 RESISTANT Resistant     CEFAZOLIN >=64 RESISTANT Resistant     CEFEPIME >=32 RESISTANT Resistant     CEFTRIAXONE >=64 RESISTANT Resistant     CIPROFLOXACIN >=4 RESISTANT Resistant     GENTAMICIN >=16 RESISTANT Resistant     IMIPENEM 0.5 SENSITIVE Sensitive     NITROFURANTOIN 256 RESISTANT Resistant     TRIMETH/SULFA >=320 RESISTANT Resistant     AMPICILLIN/SULBACTAM >=32 RESISTANT Resistant     PIP/TAZO >=128 RESISTANT Resistant     * >=100,000 COLONIES/mL KLEBSIELLA PNEUMONIAE  Blood Culture (routine x 2)     Status: None (Preliminary result)   Collection Time: 04/29/22  6:00 PM   Specimen: BLOOD  Result Value Ref Range Status   Specimen Description   Final    BLOOD LEFT ANTECUBITAL Performed at [facility] [address], [place], [place] [address]    Special Requests   Final    BOTTLES DRAWN AEROBIC AND ANAEROBIC Blood Culture adequate volume Performed at [facility] [address], [place], [place] [address]    Culture   Final    NO GROWTH 2 DAYS Performed at [facility] [address], [place], [place] [address]    Report Status PENDING  Incomplete    Studies/Results: CT PELVIS W CONTRAST  Result Date: 05/01/2022 CLINICAL DATA:  Prostatitis. EXAM: CT PELVIS WITH CONTRAST TECHNIQUE: Multidetector CT imaging of the pelvis was performed using the standard protocol following the bolus administration of intravenous contrast. RADIATION DOSE REDUCTION: This exam was performed according to the departmental dose-optimization program which includes automated exposure control, adjustment of the mA and/or kV according to patient size and/or use of iterative reconstruction technique. CONTRAST:  133m OMNIPAQUE IOHEXOL 300 MG/ML  SOLN COMPARISON:  None Available. FINDINGS: Urinary Tract: Mild circumferential bladder wall thickening. No bladder stone. Bowel: No obstruction or inflammation of pelvic bowel loops. Normal appendix. Moderate stool in the included colon. Vascular/Lymphatic: Aorto bi-iliac atherosclerosis. There multiple small inguinal lymph nodes, not enlarged by size criteria. No pelvic sidewall adenopathy. Reproductive: Heterogeneous and enlarged prostate gland, prostate spans 5.6 cm transverse. Scattered prostatic calcifications. There is no evidence of prostatic fluid collection. There  is no discrete prostatic inflammation. Unremarkable seminal vesicles. Other: Generalized edema of the subcutaneous and intrapelvic fat. More confluent edema is seen in the penile fat. No intrapelvic fluid collection. Foci of soft tissue gas in the anterior abdominal wall often seen with medication injection sites. Musculoskeletal: No focal bone lesion. Mild osteoarthritis of both hips. Degenerative disc disease at L4-L5 and L5-S1. IMPRESSION: 1. Heterogeneous and enlarged prostate gland without evidence of prostatic fluid collection or discrete prostatic  inflammation. 2. Mild circumferential bladder wall thickening, may be due to chronic bladder outlet obstruction or cystitis. 3. Generalized edema of subcutaneous and intrapelvic fat, nonspecific. Aortic Atherosclerosis (ICD10-I70.0). Electronically Signed   By: Keith Rake M.D.   On: 05/01/2022 18:33   DG CHEST PORT 1 VIEW  Result Date: 04/30/2022 CLINICAL DATA:  Shortness of breath. EXAM: PORTABLE CHEST 1 VIEW COMPARISON:  04/29/2022. FINDINGS: Examination is limited due to patient rotation. The heart is enlarged and the mediastinal contour is stable. Lung volumes are low with patchy airspace disease at the lung bases. There are hazy opacities at the lung bases in the possibility of pleural effusions can not be excluded. No definite pneumothorax. No acute osseous abnormality. IMPRESSION: 1. Patchy airspace disease and hazy opacification at the lung bases, possible atelectasis or infiltrate. The possibility of small pleural effusions can not be excluded. 2. Cardiomegaly. Electronically Signed   By: Brett Fairy M.D.   On: 04/30/2022 23:44      Assessment/Plan:  INTERVAL HISTORY: CT abdomen pelvis without any intra-abdominal abscess or infection  Principal Problem:   UTI due to extended-spectrum beta lactamase (ESBL) producing Escherichia coli Active Problems:   Parkinson disease (HCC)   Diabetes ([facility])   BPH (benign prostatic hyperplasia)   HTN (hypertension)   Dysphagia    [name] is a 76 y.o. 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  ESBL urinary tract infection: Continue meropenem for now and would favor course of 5 days total  There does not to be any surgical intervention that can be undertaken to reduce his risks of having recurrent urinary tract infections.  Efforts are being made to increase urinary flow.  He has been seen by Dr. Gloriann Loan who  ordered CT abdomen pelvis which shows no significant infection in the prostate or abdomen.  There is also no antibiotic solution to his problem other than what Dr. Gearldine Shown was attempting to do which was 2 restrain physicians them giving the patient antibiotics when they do not need to do so.  We are happy to help with restraining use of antibiotics.  Clearly at times he is having actual urinary tract infections and I certainly think this admission was one of them.  It is too bad we do not have a reliable systemic oral antibiotic that he could have on hand to start at first signs of genuine infection.  He could have fosfomycin around which would potentially affect the level of the bladder but not higher up in the case of a more serious pyelonephritis  I have scheduled him for follow-up with Dr. Juleen China who is seen him on 1 occasion he also has further follow-up with Dr. Gearldine Shown in the future  I spent 36 minutes with the patient including than 50% of the time in face to face counseling of the patient and his wife and daughter personally reviewed CT abdomen pelvis reviewing along  with review of medical records in preparation for the visit and during the visit and in coordination of his  care with Dr. Gloriann Loan.   [facility] has an appointment on 05/29/2022 with Dr. Juleen China at 145PM at:  [facility] is located in the [facility] which is located at   [address] in [place].  [address], which is located to the left of the elevators.  Phone: [phone]  Fax: [phone]  [email or web address]  Patient should arrive 15 to 30 minutes prior to the appointment.  I will sign off for now.  Please call with further questions.      LOS: 3 days   Alcide Evener 05/02/2022, 12:16 PM

## 2022-05-02 NOTE — Progress Notes (Addendum)
PROGRESS NOTE    [name]  ZMO:[id number] DOB: 1946-03-13 DOA: 04/29/2022 PCP: Josetta Huddle, MD  Brief Narrative:  [name] is a 76 y.o. 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  Recently hospitalized 7/7-7/9 for same treated with meropenem, has been weaker since then -Brought to the ED 7/16 evening with significant weakness, febrile to 101.4, UA abnormal again, chest x-ray was unremarkable, also  followed by ID for recurrent ESBL UTIs  Assessment & Plan:  Recurrent ESBL UTI -sepsis on admission ruled out -Recent admission for Klebsiella ESBL and few earlier this year -Cultures unchanged -seen by urology. CT pelvis without prostatic abscess. Will need to follow up with urology -ID following with recommendations to complete 5 days of meropenem. Will follow up with ID as outpatient  Dysphagia -worsening over the past week. Could be worsen progression of Parkinson -SLP evaluation completed, mild aspiration risk, regular diet with thin liquids recommended for now   HTN (hypertension) Continue Lisinopril. Controlled   Diabetes ([place]) -CBGs are stable continue sliding scale insulin   Parkinson disease ([name]) Worsening weakness likely due to current UTI -continue home Sinemet, entacapone and requip - PT/OT consult -SNF placement  DVT prophylaxis: lovenox Code Status: full Family Communication: Discussed with daughter and wife at bedside Dispo: To be determined, PT OT with recs for SNF  Consultants:  ID   Antimicrobials:  meropenem    Subjective: No new complaints  Objective: Vitals:   05/02/22 0400 05/02/22 0847 05/02/22 0936 05/02/22 1320  BP:  (!) 147/74 (!) 147/74 (!) 141/74  Pulse:  98 98 95  Resp: (!) 21 (!) 22 (!) 22 20  Temp:   98.8 F (37.1 C) 98.3 F (36.8 C)  TempSrc:    Oral Oral  SpO2:    98%  Weight:   86.2 kg   Height:   6' (1.829 m)     Intake/Output Summary (Last 24 hours) at 05/02/2022 2032 Last data filed at 05/02/2022 1854 Gross per 24 hour  Intake 1160 ml  Output 2100 ml  Net -940 ml   Filed Weights   04/29/22 1629 05/02/22 0936  Weight: 86.2 kg 86.2 kg    Examination:  General exam: Chronically ill male sitting up in bed, awake alert oriented to self and place, cognitive deficits noted CVS: S1-S2, regular rhythm Lungs: Decreased breath sounds to bases Abdomen: Soft, obese, nontender, bowel sounds present Extremities: 1+ edema bilaterally Psych: Flat affect    Data Reviewed: I have personally reviewed following labs and imaging studies  CBC: Recent Labs  Lab 04/29/22 1640 04/30/22 0440 05/02/22 0539  WBC 9.8 10.1 6.5  NEUTROABS 8.1*  --   --   HGB 13.3 12.2* 12.4*  HCT 40.8 36.5* 37.6*  MCV 91.5 90.6 90.8  PLT 245 222 035   Basic Metabolic Panel: Recent Labs  Lab 04/29/22 1640 05/01/22 0559 05/02/22 0539  NA 137 134* 130*  K 4.5 4.3 4.2  CL 104 101 97*  CO2 20* 26 28  GLUCOSE 207* 153* 206*  BUN '17 16 20  '$ CREATININE 0.98 1.06 1.12  CALCIUM 8.9 8.7* 8.3*   GFR: Estimated Creatinine Clearance: 62.5 mL/min (by C-G formula based on SCr of 1.12 mg/dL). Liver Function Tests: Recent Labs  Lab 04/29/22 1640  AST 10*  ALT 5  ALKPHOS 89  BILITOT 0.6  PROT 6.8  ALBUMIN 3.3*   No results for input(s): "LIPASE", "AMYLASE" in the last 168 hours. No results for input(s): "AMMONIA" in the last 168 hours. Coagulation Profile: Recent Labs  Lab 04/29/22 1750  INR 1.0   Cardiac Enzymes: No results for input(s): "CKTOTAL", "CKMB", "CKMBINDEX", "TROPONINI" in the last 168 hours. BNP (last 3 results) No results for input(s): "PROBNP" in the last 8760 hours. HbA1C: No results for input(s): "HGBA1C" in the last 72 hours. CBG: Recent Labs  Lab 05/01/22 1613 05/01/22 2129 05/02/22 0746 05/02/22 1123  05/02/22 1648  GLUCAP 168* 200* 177* 196* 207*   Lipid Profile: No results for input(s): "CHOL", "HDL", "LDLCALC", "TRIG", "CHOLHDL", "LDLDIRECT" in the last 72 hours. Thyroid Function Tests: No results for input(s): "TSH", "T4TOTAL", "FREET4", "T3FREE", "THYROIDAB" in the last 72 hours. Anemia Panel: No results for input(s): "VITAMINB12", "FOLATE", "FERRITIN", "TIBC", "IRON", "RETICCTPCT" in the last 72 hours. Urine analysis:    Component Value Date/Time   COLORURINE YELLOW 04/29/2022 1640   APPEARANCEUR HAZY (A) 04/29/2022 1640   LABSPEC 1.015 04/29/2022 1640   PHURINE 6.0 04/29/2022 1640   GLUCOSEU 150 (A) 04/29/2022 1640   HGBUR SMALL (A) 04/29/2022 1640   BILIRUBINUR NEGATIVE 04/29/2022 1640   KETONESUR 5 (A) 04/29/2022 1640   PROTEINUR 100 (A) 04/29/2022 1640   UROBILINOGEN 0.2 10/25/2009 1404   NITRITE POSITIVE (A) 04/29/2022 1640   LEUKOCYTESUR LARGE (A) 04/29/2022 1640   Sepsis Labs: '@LABRCNTIP'$ (procalcitonin:4,lacticidven:4)  ) Recent Results (from the past 240 hour(s))  Blood Culture (routine x 2)     Status: None (Preliminary result)   Collection Time: 04/29/22  4:37 PM   Specimen: BLOOD  Result Value Ref Range Status   Specimen Description   Final    BLOOD BLOOD LEFT HAND Performed at [facility], [place] [address], [place], [place] [address]    Special Requests   Final    BOTTLES DRAWN AEROBIC ONLY Blood Culture results may not be optimal due to an inadequate volume of blood received in culture bottles Performed at [place] [address] [place], [place] [address]    Culture   Final    NO GROWTH 3 DAYS Performed at [facility], [place] [address] [place], [place] [address]    Report Status PENDING  Incomplete  Resp Panel by RT-PCR (Flu A&B, Covid) Anterior Nasal Swab     Status: None   Collection Time: 04/29/22  4:40 PM   Specimen: Anterior Nasal Swab  Result Value Ref Range Status   SARS Coronavirus 2 by  RT PCR NEGATIVE NEGATIVE Final    Comment: (NOTE) SARS-CoV-2 target nucleic acids are NOT DETECTED.  The SARS-CoV-2 RNA is generally detectable in upper respiratory specimens during the acute phase of infection. The lowest concentration of SARS-CoV-2 viral copies this assay can detect is 138 copies/mL. A negative result does not preclude SARS-Cov-2 infection and should not be used as the sole basis for treatment or other patient management decisions. A negative result may occur with  improper specimen collection/handling, submission of specimen other than nasopharyngeal swab, presence of viral mutation(s) within the areas targeted by this assay, and inadequate number of viral copies(<138 copies/mL). A negative result must be combined with clinical observations, patient history, and epidemiological information. The expected result is Negative.  Fact Sheet for Patients:  EntrepreneurPulse.com.au  Fact Sheet for Healthcare Providers:  IncredibleEmployment.be  This test is no t yet approved or cleared by the [place] and  has been authorized  for detection and/or diagnosis of SARS-CoV-2 by FDA under an Emergency Use Authorization (EUA). This EUA will remain  in effect (meaning this test can be used) for the duration of the COVID-19 declaration under Section 564(b)(1) of the Act, 21 U.S.C.section 360bbb-3(b)(1), unless the authorization is terminated  or revoked sooner.       Influenza A by PCR NEGATIVE NEGATIVE Final   Influenza B by PCR NEGATIVE NEGATIVE Final    Comment: (NOTE) The Xpert Xpress SARS-CoV-2/FLU/RSV plus assay is intended as an aid in the diagnosis of influenza from Nasopharyngeal swab specimens and should not be used as a sole basis for treatment. Nasal washings and aspirates are unacceptable for Xpert Xpress SARS-CoV-2/FLU/RSV testing.  Fact Sheet for Patients: EntrepreneurPulse.com.au  Fact Sheet  for Healthcare Providers: IncredibleEmployment.be  This test is not yet approved or cleared by the [place] FDA and has been authorized for detection and/or diagnosis of SARS-CoV-2 by FDA under an Emergency Use Authorization (EUA). This EUA will remain in effect (meaning this test can be used) for the duration of the COVID-19 declaration under Section 564(b)(1) of the Act, 21 U.S.C. section 360bbb-3(b)(1), unless the authorization is terminated or revoked.  Performed at [facility], [place] [address], [place], [place] [address]   Urine Culture     Status: Abnormal   Collection Time: 04/29/22  4:40 PM   Specimen: In/Out Cath Urine  Result Value Ref Range Status   Specimen Description   Final    IN/OUT CATH URINE Performed at [facility] [address] [place], [place] [address]    Special Requests   Final    NONE Performed at [facility], [place] [address] [place], [place] [address]    Culture (A)  Final    >=100,000 COLONIES/mL KLEBSIELLA PNEUMONIAE Confirmed Extended Spectrum Beta-Lactamase Producer (ESBL).  In bloodstream infections from ESBL organisms, carbapenems are preferred over piperacillin/tazobactam. They are shown to have a lower risk of mortality.    Report Status 05/01/2022 FINAL  Final   Organism ID, Bacteria KLEBSIELLA PNEUMONIAE (A)  Final      Susceptibility   Klebsiella pneumoniae - MIC*    AMPICILLIN >=32 RESISTANT Resistant     CEFAZOLIN >=64 RESISTANT Resistant     CEFEPIME >=32 RESISTANT Resistant     CEFTRIAXONE >=64 RESISTANT Resistant     CIPROFLOXACIN >=4 RESISTANT Resistant     GENTAMICIN >=16 RESISTANT Resistant     IMIPENEM 0.5 SENSITIVE Sensitive     NITROFURANTOIN 256 RESISTANT Resistant     TRIMETH/SULFA >=320 RESISTANT Resistant     AMPICILLIN/SULBACTAM >=32 RESISTANT Resistant     PIP/TAZO >=128 RESISTANT Resistant     * >=100,000 COLONIES/mL KLEBSIELLA  PNEUMONIAE  Blood Culture (routine x 2)     Status: None (Preliminary result)   Collection Time: 04/29/22  6:00 PM   Specimen: BLOOD  Result Value Ref Range Status   Specimen Description   Final    BLOOD LEFT ANTECUBITAL Performed at [place] [address], [place], [place] [address]    Special Requests   Final    BOTTLES DRAWN AEROBIC AND ANAEROBIC Blood Culture adequate volume Performed at [place] [address] [place], [place] [address]    Culture   Final    NO GROWTH 2 DAYS Performed at [facility] [address] [place], [place] [address]    Report Status PENDING  Incomplete  Radiology Studies: CT PELVIS W CONTRAST  Result Date: 05/01/2022 CLINICAL DATA:  Prostatitis. EXAM: CT PELVIS WITH CONTRAST TECHNIQUE: Multidetector CT imaging of the pelvis was performed using the standard protocol following the bolus administration of intravenous contrast. RADIATION DOSE REDUCTION: This exam was performed according to the departmental dose-optimization program which includes automated exposure control, adjustment of the mA and/or kV according to patient size and/or use of iterative reconstruction technique. CONTRAST:  111m OMNIPAQUE IOHEXOL 300 MG/ML  SOLN COMPARISON:  None Available. FINDINGS: Urinary Tract: Mild circumferential bladder wall thickening. No bladder stone. Bowel: No obstruction or inflammation of pelvic bowel loops. Normal appendix. Moderate stool in the included colon. Vascular/Lymphatic: Aorto bi-iliac atherosclerosis. There multiple small inguinal lymph nodes, not enlarged by size criteria. No pelvic sidewall adenopathy. Reproductive: Heterogeneous and enlarged prostate gland, prostate spans 5.6 cm transverse. Scattered prostatic calcifications. There is no evidence of prostatic fluid collection. There is no discrete prostatic inflammation. Unremarkable seminal vesicles. Other: Generalized edema of the  subcutaneous and intrapelvic fat. More confluent edema is seen in the penile fat. No intrapelvic fluid collection. Foci of soft tissue gas in the anterior abdominal wall often seen with medication injection sites. Musculoskeletal: No focal bone lesion. Mild osteoarthritis of both hips. Degenerative disc disease at L4-L5 and L5-S1. IMPRESSION: 1. Heterogeneous and enlarged prostate gland without evidence of prostatic fluid collection or discrete prostatic inflammation. 2. Mild circumferential bladder wall thickening, may be due to chronic bladder outlet obstruction or cystitis. 3. Generalized edema of subcutaneous and intrapelvic fat, nonspecific. Aortic Atherosclerosis (ICD10-I70.0). Electronically Signed   By: MKeith RakeM.D.   On: 05/01/2022 18:33   DG CHEST PORT 1 VIEW  Result Date: 04/30/2022 CLINICAL DATA:  Shortness of breath. EXAM: PORTABLE CHEST 1 VIEW COMPARISON:  04/29/2022. FINDINGS: Examination is limited due to patient rotation. The heart is enlarged and the mediastinal contour is stable. Lung volumes are low with patchy airspace disease at the lung bases. There are hazy opacities at the lung bases in the possibility of pleural effusions can not be excluded. No definite pneumothorax. No acute osseous abnormality. IMPRESSION: 1. Patchy airspace disease and hazy opacification at the lung bases, possible atelectasis or infiltrate. The possibility of small pleural effusions can not be excluded. 2. Cardiomegaly. Electronically Signed   By: LBrett FairyM.D.   On: 04/30/2022 23:44        Scheduled Meds:  carbidopa-levodopa  3 tablet Oral 5 times per day   cholecalciferol  2,000 Units Oral Daily   docusate sodium  100 mg Oral BID   enoxaparin (LOVENOX) injection  40 mg Subcutaneous Q24H   entacapone  200 mg Oral 3 times per day   insulin aspart  0-9 Units Subcutaneous TID PC & HS   lisinopril  5 mg Oral Daily   methenamine  1,000 mg Oral BID   pantoprazole  40 mg Oral Daily    potassium chloride SA  10 mEq Oral BID   rOPINIRole  1.5 mg Oral QHS   rOPINIRole  3 mg Oral BID AC   tamsulosin  0.4 mg Oral BID   vitamin B-12  1,000 mcg Oral Daily   vitamin C  250 mg Oral BID   Continuous Infusions:  meropenem (MERREM) IV Stopped (05/02/22 1635)     LOS: 3 days     JKathie Dike MD [facility]   05/02/2022, 8:32 PM

## 2022-05-03 ENCOUNTER — Inpatient Hospital Stay (HOSPITAL_COMMUNITY): Payer: Medicare Other

## 2022-05-03 DIAGNOSIS — N390 Urinary tract infection, site not specified: Secondary | ICD-10-CM | POA: Diagnosis not present

## 2022-05-03 DIAGNOSIS — G20 Parkinson's disease: Secondary | ICD-10-CM | POA: Diagnosis not present

## 2022-05-03 DIAGNOSIS — R531 Weakness: Secondary | ICD-10-CM | POA: Diagnosis not present

## 2022-05-03 DIAGNOSIS — Z515 Encounter for palliative care: Secondary | ICD-10-CM | POA: Diagnosis not present

## 2022-05-03 LAB — GLUCOSE, CAPILLARY
Glucose-Capillary: 220 mg/dL — ABNORMAL HIGH (ref 70–99)
Glucose-Capillary: 207 mg/dL — ABNORMAL HIGH (ref 70–99)

## 2022-05-03 LAB — BASIC METABOLIC PANEL
Anion gap: 7 (ref 5–15)
BUN: 17 mg/dL (ref 8–23)
CO2: 26 mmol/L (ref 22–32)
Calcium: 8.4 mg/dL — ABNORMAL LOW (ref 8.9–10.3)
Chloride: 104 mmol/L (ref 98–111)
Creatinine, Ser: 0.95 mg/dL (ref 0.61–1.24)
GFR, Estimated: >60 mL/min (ref >60)
Glucose, Bld: 250 mg/dL — ABNORMAL HIGH (ref 70–99)
Potassium: 4.1 mmol/L (ref 3.5–5.1)
Sodium: 137 mmol/L (ref 135–145)

## 2022-05-03 LAB — BRAIN NATRIURETIC PEPTIDE: B Natriuretic Peptide: 625.7 pg/mL — ABNORMAL HIGH (ref 0.0–100.0)

## 2022-05-03 MED ORDER — CHLORHEXIDINE GLUCONATE CLOTH 2 % EX PADS
6.0000 | MEDICATED_PAD | Freq: Every day | CUTANEOUS | Status: DC
  Administered 2022-05-03 – 2022-05-08 (×6): 6 via TOPICAL

## 2022-05-03 MED ORDER — SODIUM CHLORIDE 0.9% FLUSH
10.0000 mL | INTRAVENOUS | Status: DC | PRN
  Administered 2022-05-05 – 2022-05-08 (×2): 10 mL

## 2022-05-03 MED ORDER — INSULIN GLARGINE-YFGN 100 UNIT/ML ~~LOC~~ SOLN
10.0000 [IU] | Freq: Every day | SUBCUTANEOUS | Status: DC
  Administered 2022-05-03 – 2022-05-08 (×6): 10 [IU] via SUBCUTANEOUS
  Filled 2022-05-03 (×6): qty 0.1

## 2022-05-03 MED ORDER — SODIUM CHLORIDE 0.9% FLUSH
10.0000 mL | Freq: Two times a day (BID) | INTRAVENOUS | Status: DC
  Administered 2022-05-03 – 2022-05-08 (×9): 10 mL

## 2022-05-03 MED ORDER — FUROSEMIDE 10 MG/ML IJ SOLN
20.0000 mg | Freq: Once | INTRAMUSCULAR | Status: AC
  Administered 2022-05-03: 20 mg via INTRAVENOUS
  Filled 2022-05-03: qty 2

## 2022-05-03 NOTE — Progress Notes (Addendum)
Pharmacy Antibiotic Note  [name] is a 76 y.o. male admitted on 04/29/2022 with UTI.  PMH significant for bilateral renal cysts, parkinsonism, and history of ESBL UTI's. ID following and recommend 5 days of meropenem. Pharmacy has been consulted for meropenem dosing.  Plan: Continue meropenem 1 gm IV q8 hours Stop date for 05/04/22 entered to complete 5 full days of antibiotic (only received 1g of meropenem on 7/16)  Pharmacy will formally sign off consult but continue to monitor patient peripherally.  Height: 6' (182.9 cm) Weight: 86.2 kg (190 lb) IBW/kg (Calculated) : 77.6  Temp (24hrs), Avg:98.5 F (36.9 C), Min:98.2 F (36.8 C), Max:98.8 F (37.1 C)  Recent Labs  Lab 04/29/22 1640 04/29/22 1752 04/30/22 0440 05/01/22 0559 05/02/22 0539 05/03/22 0530  WBC 9.8  --  10.1  --  6.5  --   CREATININE 0.98  --   --  1.06 1.12 0.95  LATICACIDVEN 1.7 1.2  --   --   --   --      Estimated Creatinine Clearance: 73.7 mL/min (by C-G formula based on SCr of 0.95 mg/dL).    Allergies  Allergen Reactions   Nucynta [Tapentadol] Other (See Comments)    Made the patient not feel well. "He did not feel well at all."   Oxycodone Itching   Sudafed [Pseudoephedrine Hcl] Other (See Comments)    Problems urinating    Tramadol Hcl Itching and Other (See Comments)    Can tolerate, however (in 2023)    Antimicrobials this admission: 7/16 meropenem>>7/21  Microbiology results: 7/16 BCx2: ngtd 7/16 Ucx: ESBL Klebsiella pneumoniae  PTA: 7/7 UCx catherized: > 100 K ESBL Klebsiella pneumoniae R to all X imipenem 7/7 BCx2 ngF  Thank you for allowing pharmacy to be a part of this patient's care.  Dimple Nanas, PharmD, BCPS 05/03/2022 7:25 AM

## 2022-05-03 NOTE — Progress Notes (Signed)
Physical Therapy Treatment Patient Details [name] MRN: [id number] DOB: 1946-07-05 Today's Date: 05/03/2022   History of Present Illness Patient is a 76 year old male who was recently admitted from 7/7 to 7/9 with UTI. patient has a history of recurrent UTIs with hospitalizations in March and May for UTI. patient was admitted this hospitalization with UTI due to extended sepctrum beta lactamase producing Escherichia coli and dysphagia. PMH: T12 spinous process fx, L1 ant-middle colum fx and sacral fx.    PT Comments    Pt tolerated increased ambulation distance today, he requires ongoing verbal/manual cues for safe positioning in RW and and for posture. He remains a high fall risk due to decreased safety awareness.    Recommendations for follow up therapy are one component of a multi-disciplinary discharge planning process, led by the attending physician.  Recommendations may be updated based on patient status, additional functional criteria and insurance authorization.  Follow Up Recommendations  Skilled nursing-short term rehab (<3 hours/day) Can patient physically be transported by private vehicle: Yes   Assistance Recommended at Discharge Frequent or constant Supervision/Assistance  Patient can return home with the following A little help with walking and/or transfers;Assistance with cooking/housework;Assist for transportation;Help with stairs or ramp for entrance;A little help with bathing/dressing/bathroom   Equipment Recommendations  None recommended by PT    Recommendations for Other Services       Precautions / Restrictions Precautions Precautions: Fall Precaution Comments: back precautions for comfort Required Braces or Orthoses: Spinal Brace Spinal Brace: Lumbar corset;Applied in sitting position Spinal Brace Comments: on when OOB Restrictions Weight Bearing Restrictions: No     Mobility  Bed Mobility   Bed Mobility: Rolling, Sidelying to Sit Rolling: Min  assist Sidelying to sit: Min assist       General bed mobility comments: min A to raise trunk    Transfers Overall transfer level: Needs assistance Equipment used: Rolling walker (2 wheels) Transfers: Sit to/from Stand Sit to Stand: Min assist           General transfer comment: Assist to rise, steady, control descent. Cues for safety, technique, hand placement.    Ambulation/Gait Ambulation/Gait assistance: Min assist, Min guard Gait Distance (Feet): 300 Feet Assistive device: Rolling walker (2 wheels) Gait Pattern/deviations: Festinating, Shuffle, Trunk flexed Gait velocity: WFL     General Gait Details: Ongoing VCs for proximity to RW, shufling, festination, flexed trunk   Stairs             Wheelchair Mobility    Modified Rankin (Stroke Patients Only)       Balance Overall balance assessment: Needs assistance, History of Falls Sitting-balance support: Feet supported Sitting balance-Leahy Scale: Fair     Standing balance support: Bilateral upper extremity supported, During functional activity, Reliant on assistive device for balance Standing balance-Leahy Scale: Poor                              Cognition Arousal/Alertness: Awake/alert Behavior During Therapy: WFL for tasks assessed/performed Overall Cognitive Status: Impaired/Different from baseline Area of Impairment: Problem solving, Safety/judgement                         Safety/Judgement: Decreased awareness of deficits, Decreased awareness of safety   Problem Solving: Requires verbal cues General Comments: requires ongoing reminders to stay close to RW when ambulating        Exercises  General Comments        Pertinent Vitals/Pain Pain Assessment Pain Assessment: No/denies pain    Home Living                          Prior Function            PT Goals (current goals can now be found in the care plan section) Acute Rehab PT  Goals Patient Stated Goal: ST-SNF PT Goal Formulation: With patient/family Time For Goal Achievement: 05/15/22 Potential to Achieve Goals: Good Progress towards PT goals: Progressing toward goals    Frequency    Min 2X/week      PT Plan Current plan remains appropriate    Co-evaluation              AM-PAC PT "6 Clicks" Mobility   Outcome Measure  Help needed turning from your back to your side while in a flat bed without using bedrails?: A Little Help needed moving from lying on your back to sitting on the side of a flat bed without using bedrails?: A Little Help needed moving to and from a bed to a chair (including a wheelchair)?: A Little Help needed standing up from a chair using your arms (e.g., wheelchair or bedside chair)?: A Little Help needed to walk in hospital room?: A Little Help needed climbing 3-5 steps with a railing? : A Lot 6 Click Score: 17    End of Session Equipment Utilized During Treatment: Gait belt;Back brace Activity Tolerance: Patient tolerated treatment well Patient left: in chair;with call bell/phone within reach;with family/visitor present Nurse Communication: Mobility status PT Visit Diagnosis: Difficulty in walking, not elsewhere classified (R26.2);Muscle weakness (generalized) (M62.81);Other symptoms and signs involving the nervous system (R29.898);History of falling (Z91.81)     Time: 1450-1511 PT Time Calculation (min) (ACUTE ONLY): 21 min  Charges:  $Gait Training: 8-22 mins                     Blondell Reveal Kistler PT 05/03/2022  Acute Rehabilitation Services  Office [phone]

## 2022-05-03 NOTE — Care Management Important Message (Signed)
Important Message  Patient Details IM Letter placed in Patients room. [name] MRN: [id number] Date of Birth: 1946/02/14   Medicare Important Message Given:  Yes     [name] 05/03/2022, 10:58 AM

## 2022-05-03 NOTE — Progress Notes (Signed)
PROGRESS NOTE    HECTOR TAFT  PPJ:[id number] DOB: Apr 07, 1946 DOA: 04/29/2022 PCP: Josetta Huddle, MD  Brief Narrative:  [name] is a 76 y.o. 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  Recently hospitalized 7/7-7/9 for same treated with meropenem, has been weaker since then -Brought to the ED 7/16 evening with significant weakness, febrile to 101.4, UA abnormal again, chest x-ray was unremarkable, also  followed by ID for recurrent ESBL UTIs  Assessment & Plan:  Recurrent ESBL UTI -sepsis on admission ruled out -Recent admission for Klebsiella ESBL and few earlier this year -Cultures unchanged -seen by urology. CT pelvis without prostatic abscess. Will need to follow up with urology -ID following with recommendations to complete 5 days of meropenem. Will follow up with ID as outpatient  Dysphagia -worsening over the past week. Could be worsen progression of Parkinson -SLP evaluation completed, mild aspiration risk, regular diet with thin liquids recommended for now  Chronic diastolic CHF -Noted to have crackles on lung exam and some lower extremity edema -Repeat chest x-ray, check BNP -Echo from 07/2021 showed grade 2 diastolic dysfunction with preserved ejection fraction, will update -Start back on Lasix   HTN (hypertension) Continue Lisinopril.    Diabetes ([place]) -CBGs are stable continue sliding scale insulin   Parkinson disease ([place]) Worsening weakness likely due to current UTI -continue home Sinemet, entacapone and requip - PT/OT consult -SNF placement  DVT prophylaxis: lovenox Code Status: full Family Communication: Discussed with daughter and wife at bedside Dispo: To be determined, PT OT with recs for SNF  Consultants:  ID   Antimicrobials:  meropenem    Subjective: He  denies any complaints.  Family noted that he did seem more short of breath.  Objective: Vitals:   05/02/22 1320 05/02/22 2049 05/03/22 0305 05/03/22 1550  BP: (!) 141/74 130/85 (!) 154/82 (!) 171/87  Pulse: 95 68 83 96  Resp: '20 19 16 16  '$ Temp: 98.3 F (36.8 C) 98.2 F (36.8 C) 98.8 F (37.1 C) 98.3 F (36.8 C)  TempSrc: Oral  Oral Oral  SpO2: 98% 99% 97% 99%  Weight:      Height:        Intake/Output Summary (Last 24 hours) at 05/03/2022 1919 Last data filed at 05/03/2022 1901 Gross per 24 hour  Intake 1637 ml  Output 2150 ml  Net -513 ml   Filed Weights   04/29/22 1629 05/02/22 0936  Weight: 86.2 kg 86.2 kg    Examination:  General exam: Chronically ill male sitting up in bed, awake alert oriented to self and place, cognitive deficits noted CVS: S1-S2, regular rhythm Lungs: Bilateral crackles Abdomen: Soft, obese, nontender, bowel sounds present Extremities: 1+ edema bilaterally Psych: Flat affect    Data Reviewed: I have personally reviewed following labs and imaging studies  CBC: Recent Labs  Lab 04/29/22 1640 04/30/22 0440 05/02/22 0539  WBC 9.8 10.1 6.5  NEUTROABS 8.1*  --   --   HGB 13.3 12.2* 12.4*  HCT 40.8 36.5* 37.6*  MCV 91.5 90.6 90.8  PLT 245 222 580   Basic Metabolic Panel: Recent Labs  Lab 04/29/22 1640 05/01/22 0559 05/02/22 0539 05/03/22 0530  NA 137 134* 130* 137  K 4.5 4.3 4.2 4.1  CL 104 101 97* 104  CO2 20* '26 28 26  '$ GLUCOSE 207* 153* 206* 250*  BUN  $'17 16 20 17  'c$ CREATININE 0.98 1.06 1.12 0.95  CALCIUM 8.9 8.7* 8.3* 8.4*   GFR: Estimated Creatinine Clearance: 73.7 mL/min (by C-G formula based on SCr of 0.95 mg/dL). Liver Function Tests: Recent Labs  Lab 04/29/22 1640  AST 10*  ALT 5  ALKPHOS 89  BILITOT 0.6  PROT 6.8  ALBUMIN 3.3*   No results for input(s): "LIPASE", "AMYLASE" in the last 168 hours. No results for input(s): "AMMONIA" in the last 168 hours. Coagulation Profile: Recent Labs  Lab 04/29/22 1750   INR 1.0   Cardiac Enzymes: No results for input(s): "CKTOTAL", "CKMB", "CKMBINDEX", "TROPONINI" in the last 168 hours. BNP (last 3 results) No results for input(s): "PROBNP" in the last 8760 hours. HbA1C: No results for input(s): "HGBA1C" in the last 72 hours. CBG: Recent Labs  Lab 05/02/22 1123 05/02/22 1648 05/02/22 2107 05/03/22 0750 05/03/22 1156  GLUCAP 196* 207* 226* 220* 207*   Lipid Profile: No results for input(s): "CHOL", "HDL", "LDLCALC", "TRIG", "CHOLHDL", "LDLDIRECT" in the last 72 hours. Thyroid Function Tests: No results for input(s): "TSH", "T4TOTAL", "FREET4", "T3FREE", "THYROIDAB" in the last 72 hours. Anemia Panel: No results for input(s): "VITAMINB12", "FOLATE", "FERRITIN", "TIBC", "IRON", "RETICCTPCT" in the last 72 hours. Urine analysis:    Component Value Date/Time   COLORURINE YELLOW 04/29/2022 1640   APPEARANCEUR HAZY (A) 04/29/2022 1640   LABSPEC 1.015 04/29/2022 1640   PHURINE 6.0 04/29/2022 1640   GLUCOSEU 150 (A) 04/29/2022 1640   HGBUR SMALL (A) 04/29/2022 1640   BILIRUBINUR NEGATIVE 04/29/2022 1640   KETONESUR 5 (A) 04/29/2022 1640   PROTEINUR 100 (A) 04/29/2022 1640   UROBILINOGEN 0.2 10/25/2009 1404   NITRITE POSITIVE (A) 04/29/2022 1640   LEUKOCYTESUR LARGE (A) 04/29/2022 1640   Sepsis Labs: '@LABRCNTIP'$ (procalcitonin:4,lacticidven:4)  ) Recent Results (from the past 240 hour(s))  Blood Culture (routine x 2)     Status: None (Preliminary result)   Collection Time: 04/29/22  4:37 PM   Specimen: BLOOD  Result Value Ref Range Status   Specimen Description   Final    BLOOD BLOOD LEFT HAND Performed at [facility], [place] [address], [place], [place] [address]    Special Requests   Final    BOTTLES DRAWN AEROBIC ONLY Blood Culture results may not be optimal due to an inadequate volume of blood received in culture bottles Performed at [facility] [address] [place], [place] [address]     Culture   Final    NO GROWTH 4 DAYS Performed at [facility], [place] [address] [place], [place] [address]    Report Status PENDING  Incomplete  Resp Panel by RT-PCR (Flu A&B, Covid) Anterior Nasal Swab     Status: None   Collection Time: 04/29/22  4:40 PM   Specimen: Anterior Nasal Swab  Result Value Ref Range Status   SARS Coronavirus 2 by RT PCR NEGATIVE NEGATIVE Final    Comment: (NOTE) SARS-CoV-2 target nucleic acids are NOT DETECTED.  The SARS-CoV-2 RNA is generally detectable in upper respiratory specimens during the acute phase of infection. The lowest concentration of SARS-CoV-2 viral copies this assay can detect is 138 copies/mL. A negative result does not preclude SARS-Cov-2 infection and should not be used as the sole basis for treatment or other patient management decisions. A negative result may occur with  improper specimen collection/handling, submission of specimen other than nasopharyngeal swab, presence of viral mutation(s) within the areas targeted by this assay, and inadequate number  of viral copies(<138 copies/mL). A negative result must be combined with clinical observations, patient history, and epidemiological information. The expected result is Negative.  Fact Sheet for Patients:  EntrepreneurPulse.com.au  Fact Sheet for Healthcare Providers:  IncredibleEmployment.be  This test is no t yet approved or cleared by the [place] FDA and  has been authorized for detection and/or diagnosis of SARS-CoV-2 by FDA under an Emergency Use Authorization (EUA). This EUA will remain  in effect (meaning this test can be used) for the duration of the COVID-19 declaration under Section 564(b)(1) of the Act, 21 U.S.C.section 360bbb-3(b)(1), unless the authorization is terminated  or revoked sooner.       Influenza A by PCR NEGATIVE NEGATIVE Final   Influenza B by PCR NEGATIVE NEGATIVE Final    Comment: (NOTE) The Xpert  Xpress SARS-CoV-2/FLU/RSV plus assay is intended as an aid in the diagnosis of influenza from Nasopharyngeal swab specimens and should not be used as a sole basis for treatment. Nasal washings and aspirates are unacceptable for Xpert Xpress SARS-CoV-2/FLU/RSV testing.  Fact Sheet for Patients: EntrepreneurPulse.com.au  Fact Sheet for Healthcare Providers: IncredibleEmployment.be  This test is not yet approved or cleared by the [place] FDA and has been authorized for detection and/or diagnosis of SARS-CoV-2 by FDA under an Emergency Use Authorization (EUA). This EUA will remain in effect (meaning this test can be used) for the duration of the COVID-19 declaration under Section 564(b)(1) of the Act, 21 U.S.C. section 360bbb-3(b)(1), unless the authorization is terminated or revoked.  Performed at [facility], [place] [address], [place], [place] [address]   Urine Culture     Status: Abnormal   Collection Time: 04/29/22  4:40 PM   Specimen: In/Out Cath Urine  Result Value Ref Range Status   Specimen Description   Final    IN/OUT CATH URINE Performed at [facility] [address], [place], [place] [address]    Special Requests   Final    NONE Performed at [facility], [place] [address], [place], [place] [address]    Culture (A)  Final    >=100,000 COLONIES/mL KLEBSIELLA PNEUMONIAE Confirmed Extended Spectrum Beta-Lactamase Producer (ESBL).  In bloodstream infections from ESBL organisms, carbapenems are preferred over piperacillin/tazobactam. They are shown to have a lower risk of mortality.    Report Status 05/01/2022 FINAL  Final   Organism ID, Bacteria KLEBSIELLA PNEUMONIAE (A)  Final      Susceptibility   Klebsiella pneumoniae - MIC*    AMPICILLIN >=32 RESISTANT Resistant     CEFAZOLIN >=64 RESISTANT Resistant     CEFEPIME >=32 RESISTANT Resistant     CEFTRIAXONE >=64 RESISTANT  Resistant     CIPROFLOXACIN >=4 RESISTANT Resistant     GENTAMICIN >=16 RESISTANT Resistant     IMIPENEM 0.5 SENSITIVE Sensitive     NITROFURANTOIN 256 RESISTANT Resistant     TRIMETH/SULFA >=320 RESISTANT Resistant     AMPICILLIN/SULBACTAM >=32 RESISTANT Resistant     PIP/TAZO >=128 RESISTANT Resistant     * >=100,000 COLONIES/mL KLEBSIELLA PNEUMONIAE  Blood Culture (routine x 2)     Status: None (Preliminary result)   Collection Time: 04/29/22  6:00 PM   Specimen: BLOOD  Result Value Ref Range Status   Specimen Description   Final    BLOOD LEFT ANTECUBITAL Performed at [facility] [address], [place], [place] [address]    Special Requests   Final    BOTTLES DRAWN AEROBIC AND ANAEROBIC  Blood Culture adequate volume Performed at [facility] [address], [place], [place] [address]    Culture   Final    NO GROWTH 3 DAYS Performed at [facility], [place] [address] [place],  [address]    Report Status PENDING  Incomplete         Radiology Studies: DG CHEST PORT 1 VIEW  Result Date: 05/03/2022 CLINICAL DATA:  Shortness of breath. EXAM: PORTABLE CHEST 1 VIEW COMPARISON:  Chest radiograph 04/30/2022. FINDINGS: The patient is rotated limiting assessment. Apparent rightward shift of the heart is likely due to rotation. Mild cardiomegaly. Question of septal thickening at the right lung base. Small pleural effusions. Trace fluid in the right minor fissure. No pneumothorax. IMPRESSION: Small pleural effusions. Question of septal thickening at the right lung base, suspicious for pulmonary edema. Mild cardiomegaly. Electronically Signed   By: Keith Rake M.D.   On: 05/03/2022 18:12        Scheduled Meds:  carbidopa-levodopa  3 tablet Oral 5 times per day   Chlorhexidine Gluconate Cloth  6 each Topical Daily   cholecalciferol  2,000 Units Oral Daily   docusate sodium  100 mg Oral BID   enoxaparin (LOVENOX) injection  40  mg Subcutaneous Q24H   entacapone  200 mg Oral 3 times per day   insulin aspart  0-9 Units Subcutaneous TID PC & HS   insulin glargine-yfgn  10 Units Subcutaneous Daily   lisinopril  5 mg Oral Daily   methenamine  1,000 mg Oral BID   pantoprazole  40 mg Oral Daily   potassium chloride SA  10 mEq Oral BID   rOPINIRole  1.5 mg Oral QHS   rOPINIRole  3 mg Oral BID AC   sodium chloride flush  10-40 mL Intracatheter Q12H   tamsulosin  0.4 mg Oral BID   vitamin B-12  1,000 mcg Oral Daily   vitamin C  250 mg Oral BID   Continuous Infusions:  meropenem (MERREM) IV 1 g (05/03/22 1604)     LOS: 4 days     Kathie Dike, MD [facility]   05/03/2022, 7:19 PM

## 2022-05-03 NOTE — Progress Notes (Signed)
Pt did well with swallowing his medications this shift.

## 2022-05-04 ENCOUNTER — Inpatient Hospital Stay (HOSPITAL_COMMUNITY): Payer: Medicare Other

## 2022-05-04 DIAGNOSIS — I509 Heart failure, unspecified: Secondary | ICD-10-CM | POA: Diagnosis not present

## 2022-05-04 DIAGNOSIS — R531 Weakness: Secondary | ICD-10-CM | POA: Diagnosis not present

## 2022-05-04 DIAGNOSIS — G20 Parkinson's disease: Secondary | ICD-10-CM | POA: Diagnosis not present

## 2022-05-04 DIAGNOSIS — N390 Urinary tract infection, site not specified: Secondary | ICD-10-CM | POA: Diagnosis not present

## 2022-05-04 DIAGNOSIS — Z515 Encounter for palliative care: Secondary | ICD-10-CM | POA: Diagnosis not present

## 2022-05-04 LAB — ECHOCARDIOGRAM COMPLETE
AR max vel: 2.02 cm2
AV Peak grad: 8.6 mmHg
Ao pk vel: 1.47 m/s
Area-P 1/2: 5.09 cm2
Calc EF: 53.2 %
Height: 72 in
S' Lateral: 4.6 cm
Single Plane A2C EF: 53 %
Single Plane A4C EF: 52.5 %
Weight: 3039.98 oz

## 2022-05-04 LAB — BASIC METABOLIC PANEL
Anion gap: 7 (ref 5–15)
BUN: 17 mg/dL (ref 8–23)
CO2: 28 mmol/L (ref 22–32)
Calcium: 8.6 mg/dL — ABNORMAL LOW (ref 8.9–10.3)
Chloride: 103 mmol/L (ref 98–111)
Creatinine, Ser: 0.91 mg/dL (ref 0.61–1.24)
GFR, Estimated: >60 mL/min (ref >60)
Glucose, Bld: 168 mg/dL — ABNORMAL HIGH (ref 70–99)
Potassium: 3.7 mmol/L (ref 3.5–5.1)
Sodium: 138 mmol/L (ref 135–145)

## 2022-05-04 LAB — CULTURE, BLOOD (ROUTINE X 2): Culture: NO GROWTH

## 2022-05-04 LAB — GLUCOSE, CAPILLARY
Glucose-Capillary: 207 mg/dL — ABNORMAL HIGH (ref 70–99)
Glucose-Capillary: 332 mg/dL — ABNORMAL HIGH (ref 70–99)
Glucose-Capillary: 171 mg/dL — ABNORMAL HIGH (ref 70–99)
Glucose-Capillary: 335 mg/dL — ABNORMAL HIGH (ref 70–99)
Glucose-Capillary: 244 mg/dL — ABNORMAL HIGH (ref 70–99)

## 2022-05-04 MED ORDER — FUROSEMIDE 40 MG PO TABS
40.0000 mg | ORAL_TABLET | Freq: Every day | ORAL | Status: DC
  Administered 2022-05-04: 40 mg via ORAL
  Filled 2022-05-04: qty 1

## 2022-05-04 NOTE — Progress Notes (Signed)
PROGRESS NOTE    [name]  BVQ:[id number] DOB: 15-Nov-1945 DOA: 04/29/2022 PCP: Josetta Huddle, MD  Brief Narrative:  [name] is a 76 y.o. 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  Recently hospitalized 7/7-7/9 for same treated with meropenem, has been weaker since then -Brought to the ED 7/16 evening with significant weakness, febrile to 101.4, UA abnormal again, chest x-ray was unremarkable, also  followed by ID for recurrent ESBL UTIs  Assessment & Plan:  Recurrent ESBL UTI -sepsis on admission ruled out -Recent admission for Klebsiella ESBL and few earlier this year -Cultures unchanged -seen by urology. CT pelvis without prostatic abscess. Will need to follow up with urology -ID following with recommendations to complete 5 days of meropenem. Will follow up with ID as outpatient  Dysphagia -worsening over the past week. Could be worsen progression of Parkinson -SLP evaluation completed, mild aspiration risk, regular diet with thin liquids recommended for now  Chronic diastolic CHF -Noted to have crackles on lung exam and some lower extremity edema -chest xray shows small pleural effusions, BNP elevated 625 -started back on lasix -Echo from 07/2021 showed grade 2 diastolic dysfunction with preserved ejection fraction, will update -if any acute changes on echo, can consider cardiology input   HTN (hypertension) Continue Lisinopril.    Diabetes ([place]) -CBGs are stable continue sliding scale insulin   Parkinson disease ([place]) Worsening weakness likely due to current UTI -continue home Sinemet, entacapone and requip - PT/OT consult -SNF placement  DVT prophylaxis: lovenox Code Status: full Family Communication: Discussed with daughter and wife at bedside Dispo: To be determined, PT OT with  recs for SNF  Consultants:  ID Urology   Antimicrobials:  meropenem    Subjective: Says he feels about the same as yesterday. Denies any shortness of breath  Objective: Vitals:   05/03/22 1550 05/03/22 1936 05/04/22 0427 05/04/22 0918  BP: (!) 171/87 123/65 130/75 (!) 143/69  Pulse: 96 65 64 74  Resp: '16 18 16 16  '$ Temp: 98.3 F (36.8 C) 98.6 F (37 C) 98.4 F (36.9 C) 97.7 F (36.5 C)  TempSrc: Oral Oral Oral Axillary  SpO2: 99% 95% 96% 99%  Weight:      Height:        Intake/Output Summary (Last 24 hours) at 05/04/2022 1042 Last data filed at 05/04/2022 1021 Gross per 24 hour  Intake 1301 ml  Output 2550 ml  Net -1249 ml   Filed Weights   04/29/22 1629 05/02/22 0936  Weight: 86.2 kg 86.2 kg    Examination:  General exam: Chronically ill male sitting up in bed, awake alert oriented to self and place, cognitive deficits noted CVS: S1-S2, regular rhythm Lungs: Bilateral crackles, improving from yesterday Abdomen: Soft, obese, nontender, bowel sounds present Extremities: 1+ edema bilaterally, more on left (chronic) Psych: Flat affect    Data Reviewed: I have personally reviewed following labs and imaging studies  CBC: Recent Labs  Lab 04/29/22 1640 04/30/22 0440 05/02/22 0539  WBC 9.8 10.1 6.5  NEUTROABS 8.1*  --   --   HGB 13.3 12.2* 12.4*  HCT 40.8 36.5* 37.6*  MCV 91.5 90.6 90.8  PLT 245 222 800   Basic Metabolic Panel: Recent Labs  Lab 04/29/22 1640 05/01/22 0559 05/02/22 0539 05/03/22 0530 05/04/22 0345  NA 137 134* 130* 137 138  K 4.5 4.3  4.2 4.1 3.7  CL 104 101 97* 104 103  CO2 20* '26 28 26 28  '$ GLUCOSE 207* 153* 206* 250* 168*  BUN '17 16 20 17 17  '$ CREATININE 0.98 1.06 1.12 0.95 0.91  CALCIUM 8.9 8.7* 8.3* 8.4* 8.6*   GFR: Estimated Creatinine Clearance: 77 mL/min (by C-G formula based on SCr of 0.91 mg/dL). Liver Function Tests: Recent Labs  Lab 04/29/22 1640  AST 10*  ALT 5  ALKPHOS 89  BILITOT 0.6  PROT 6.8  ALBUMIN  3.3*   No results for input(s): "LIPASE", "AMYLASE" in the last 168 hours. No results for input(s): "AMMONIA" in the last 168 hours. Coagulation Profile: Recent Labs  Lab 04/29/22 1750  INR 1.0   Cardiac Enzymes: No results for input(s): "CKTOTAL", "CKMB", "CKMBINDEX", "TROPONINI" in the last 168 hours. BNP (last 3 results) No results for input(s): "PROBNP" in the last 8760 hours. HbA1C: No results for input(s): "HGBA1C" in the last 72 hours. CBG: Recent Labs  Lab 05/03/22 0750 05/03/22 1156 05/03/22 1638 05/03/22 2135 05/04/22 0734  GLUCAP 220* 207* 207* 332* 171*   Lipid Profile: No results for input(s): "CHOL", "HDL", "LDLCALC", "TRIG", "CHOLHDL", "LDLDIRECT" in the last 72 hours. Thyroid Function Tests: No results for input(s): "TSH", "T4TOTAL", "FREET4", "T3FREE", "THYROIDAB" in the last 72 hours. Anemia Panel: No results for input(s): "VITAMINB12", "FOLATE", "FERRITIN", "TIBC", "IRON", "RETICCTPCT" in the last 72 hours. Urine analysis:    Component Value Date/Time   COLORURINE YELLOW 04/29/2022 1640   APPEARANCEUR HAZY (A) 04/29/2022 1640   LABSPEC 1.015 04/29/2022 1640   PHURINE 6.0 04/29/2022 1640   GLUCOSEU 150 (A) 04/29/2022 1640   HGBUR SMALL (A) 04/29/2022 1640   BILIRUBINUR NEGATIVE 04/29/2022 1640   KETONESUR 5 (A) 04/29/2022 1640   PROTEINUR 100 (A) 04/29/2022 1640   UROBILINOGEN 0.2 10/25/2009 1404   NITRITE POSITIVE (A) 04/29/2022 1640   LEUKOCYTESUR LARGE (A) 04/29/2022 1640   Sepsis Labs: '@LABRCNTIP'$ (procalcitonin:4,lacticidven:4)  ) Recent Results (from the past 240 hour(s))  Blood Culture (routine x 2)     Status: None   Collection Time: 04/29/22  4:37 PM   Specimen: BLOOD  Result Value Ref Range Status   Specimen Description   Final    BLOOD BLOOD LEFT HAND Performed at [facility], [place] [address], [place], [place] [address]    Special Requests   Final    BOTTLES DRAWN AEROBIC ONLY Blood Culture results may not be  optimal due to an inadequate volume of blood received in culture bottles Performed at [place] [address] [place], [place] [address]    Culture   Final    NO GROWTH 5 DAYS Performed at [facility], [place] [address] [place],  [address]    Report Status 05/04/2022 FINAL  Final  Resp Panel by RT-PCR (Flu A&B, Covid) Anterior Nasal Swab     Status: None   Collection Time: 04/29/22  4:40 PM   Specimen: Anterior Nasal Swab  Result Value Ref Range Status   SARS Coronavirus 2 by RT PCR NEGATIVE NEGATIVE Final    Comment: (NOTE) SARS-CoV-2 target nucleic acids are NOT DETECTED.  The SARS-CoV-2 RNA is generally detectable in upper respiratory specimens during the acute phase of infection. The lowest concentration of SARS-CoV-2 viral copies this assay can detect is 138 copies/mL. A negative result does not preclude SARS-Cov-2 infection and should not be used as the sole basis for treatment or other patient management decisions. A negative result  may occur with  improper specimen collection/handling, submission of specimen other than nasopharyngeal swab, presence of viral mutation(s) within the areas targeted by this assay, and inadequate number of viral copies(<138 copies/mL). A negative result must be combined with clinical observations, patient history, and epidemiological information. The expected result is Negative.  Fact Sheet for Patients:  EntrepreneurPulse.com.au  Fact Sheet for Healthcare Providers:  IncredibleEmployment.be  This test is no t yet approved or cleared by the [place] FDA and  has been authorized for detection and/or diagnosis of SARS-CoV-2 by FDA under an Emergency Use Authorization (EUA). This EUA will remain  in effect (meaning this test can be used) for the duration of the COVID-19 declaration under Section 564(b)(1) of the Act, 21 U.S.C.section 360bbb-3(b)(1), unless the  authorization is terminated  or revoked sooner.       Influenza A by PCR NEGATIVE NEGATIVE Final   Influenza B by PCR NEGATIVE NEGATIVE Final    Comment: (NOTE) The Xpert Xpress SARS-CoV-2/FLU/RSV plus assay is intended as an aid in the diagnosis of influenza from Nasopharyngeal swab specimens and should not be used as a sole basis for treatment. Nasal washings and aspirates are unacceptable for Xpert Xpress SARS-CoV-2/FLU/RSV testing.  Fact Sheet for Patients: EntrepreneurPulse.com.au  Fact Sheet for Healthcare Providers: IncredibleEmployment.be  This test is not yet approved or cleared by the [place] FDA and has been authorized for detection and/or diagnosis of SARS-CoV-2 by FDA under an Emergency Use Authorization (EUA). This EUA will remain in effect (meaning this test can be used) for the duration of the COVID-19 declaration under Section 564(b)(1) of the Act, 21 U.S.C. section 360bbb-3(b)(1), unless the authorization is terminated or revoked.  Performed at [facility], [place] [address], [place], [place] [address]   Urine Culture     Status: Abnormal   Collection Time: 04/29/22  4:40 PM   Specimen: In/Out Cath Urine  Result Value Ref Range Status   Specimen Description   Final    IN/OUT CATH URINE Performed at [place] [address] [place], [place] [address]    Special Requests   Final    NONE Performed at [facility], [place] [address], [place], [place] [address]    Culture (A)  Final    >=100,000 COLONIES/mL KLEBSIELLA PNEUMONIAE Confirmed Extended Spectrum Beta-Lactamase Producer (ESBL).  In bloodstream infections from ESBL organisms, carbapenems are preferred over piperacillin/tazobactam. They are shown to have a lower risk of mortality.    Report Status 05/01/2022 FINAL  Final   Organism ID, Bacteria KLEBSIELLA PNEUMONIAE (A)  Final      Susceptibility    Klebsiella pneumoniae - MIC*    AMPICILLIN >=32 RESISTANT Resistant     CEFAZOLIN >=64 RESISTANT Resistant     CEFEPIME >=32 RESISTANT Resistant     CEFTRIAXONE >=64 RESISTANT Resistant     CIPROFLOXACIN >=4 RESISTANT Resistant     GENTAMICIN >=16 RESISTANT Resistant     IMIPENEM 0.5 SENSITIVE Sensitive     NITROFURANTOIN 256 RESISTANT Resistant     TRIMETH/SULFA >=320 RESISTANT Resistant     AMPICILLIN/SULBACTAM >=32 RESISTANT Resistant     PIP/TAZO >=128 RESISTANT Resistant     * >=100,000 COLONIES/mL KLEBSIELLA PNEUMONIAE  Blood Culture (routine x 2)     Status: None (Preliminary result)   Collection Time: 04/29/22  6:00 PM   Specimen: BLOOD  Result Value Ref Range Status   Specimen Description   Final    BLOOD LEFT ANTECUBITAL Performed  at [facility], [place] [address] [place], [place] [address]    Special Requests   Final    BOTTLES DRAWN AEROBIC AND ANAEROBIC Blood Culture adequate volume Performed at [place] [address], [place], [place] [address]    Culture   Final    NO GROWTH 4 DAYS Performed at [facility], [place] [address] [place], [place] [address]    Report Status PENDING  Incomplete         Radiology Studies: DG CHEST PORT 1 VIEW  Result Date: 05/03/2022 CLINICAL DATA:  Shortness of breath. EXAM: PORTABLE CHEST 1 VIEW COMPARISON:  Chest radiograph 04/30/2022. FINDINGS: The patient is rotated limiting assessment. Apparent rightward shift of the heart is likely due to rotation. Mild cardiomegaly. Question of septal thickening at the right lung base. Small pleural effusions. Trace fluid in the right minor fissure. No pneumothorax. IMPRESSION: Small pleural effusions. Question of septal thickening at the right lung base, suspicious for pulmonary edema. Mild cardiomegaly. Electronically Signed   By: Keith Rake M.D.   On: 05/03/2022 18:12        Scheduled Meds:  carbidopa-levodopa  3 tablet Oral 5 times  per day   Chlorhexidine Gluconate Cloth  6 each Topical Daily   cholecalciferol  2,000 Units Oral Daily   docusate sodium  100 mg Oral BID   enoxaparin (LOVENOX) injection  40 mg Subcutaneous Q24H   entacapone  200 mg Oral 3 times per day   furosemide  40 mg Oral Daily   insulin aspart  0-9 Units Subcutaneous TID PC & HS   insulin glargine-yfgn  10 Units Subcutaneous Daily   lisinopril  5 mg Oral Daily   methenamine  1,000 mg Oral BID   pantoprazole  40 mg Oral Daily   potassium chloride SA  10 mEq Oral BID   rOPINIRole  1.5 mg Oral QHS   rOPINIRole  3 mg Oral BID AC   sodium chloride flush  10-40 mL Intracatheter Q12H   tamsulosin  0.4 mg Oral BID   vitamin B-12  1,000 mcg Oral Daily   vitamin C  250 mg Oral BID   Continuous Infusions:  meropenem (MERREM) IV 1 g (05/04/22 0908)     LOS: 5 days     Kathie Dike, MD [facility]   05/04/2022, 10:42 AM

## 2022-05-04 NOTE — Progress Notes (Addendum)
Physical Therapy Treatment Patient Details [name] MRN: [id number] DOB: 06/21/1946 Today's Date: 05/04/2022   History of Present Illness Patient is a 76 year old male who was recently admitted from 7/7 to 7/9 with UTI. patient has a history of recurrent UTIs with hospitalizations in March and May for UTI. patient was admitted this hospitalization with UTI due to extended sepctrum beta lactamase producing Escherichia coli and dysphagia. PMH: T12 spinous process fx, L1 ant-middle colum fx and sacral fx.    PT Comments    Min assist to don back brace. Pt ambulated 300' with RW with ongoing verbal cues for safe proximity to RW and to correct flexed posture. Festinating gait observed, no loss of balance. Pt is very pleasant and puts forth good effort.   Recommendations for follow up therapy are one component of a multi-disciplinary discharge planning process, led by the attending physician.  Recommendations may be updated based on patient status, additional functional criteria and insurance authorization.  Follow Up Recommendations  Skilled nursing-short term rehab (<3 hours/day) Can patient physically be transported by private vehicle: Yes   Assistance Recommended at Discharge Frequent or constant Supervision/Assistance  Patient can return home with the following A little help with walking and/or transfers;Assistance with cooking/housework;Assist for transportation;Help with stairs or ramp for entrance;A little help with bathing/dressing/bathroom   Equipment Recommendations  None recommended by PT    Recommendations for Other Services       Precautions / Restrictions Precautions Precautions: Fall Precaution Comments: back precautions for comfort Required Braces or Orthoses: Spinal Brace Spinal Brace: Lumbar corset;Applied in sitting position Spinal Brace Comments: on when OOB Restrictions Weight Bearing Restrictions: No     Mobility  Bed Mobility   Bed Mobility: Rolling,  Sidelying to Sit Rolling: Min assist, Supervision Sidelying to sit: Supervision, HOB elevated       General bed mobility comments: used rail    Transfers Overall transfer level: Needs assistance Equipment used: Rolling walker (2 wheels) Transfers: Sit to/from Stand Sit to Stand: From elevated surface, Min guard           General transfer comment: . Cues for safety, technique, hand placement.    Ambulation/Gait Ambulation/Gait assistance: Min guard, Min assist Gait Distance (Feet): 300 Feet Assistive device: Rolling walker (2 wheels) Gait Pattern/deviations: Festinating, Shuffle, Trunk flexed Gait velocity: WFL     General Gait Details: Ongoing VCs for proximity to RW and posture, shufling, festination, flexed trunk; no loss of balance   Stairs             Wheelchair Mobility    Modified Rankin (Stroke Patients Only)       Balance Overall balance assessment: Needs assistance, History of Falls Sitting-balance support: Feet supported Sitting balance-Leahy Scale: Fair     Standing balance support: Bilateral upper extremity supported, During functional activity, Reliant on assistive device for balance Standing balance-Leahy Scale: Poor                              Cognition Arousal/Alertness: Awake/alert Behavior During Therapy: WFL for tasks assessed/performed Overall Cognitive Status: Impaired/Different from baseline Area of Impairment: Problem solving, Safety/judgement                         Safety/Judgement: Decreased awareness of deficits, Decreased awareness of safety   Problem Solving: Requires verbal cues General Comments: requires ongoing reminders to stay close to RW when ambulating  Exercises      General Comments        Pertinent Vitals/Pain Pain Assessment Pain Assessment: No/denies pain    Home Living                          Prior Function            PT Goals (current goals can  now be found in the care plan section) Acute Rehab PT Goals Patient Stated Goal: ST-SNF PT Goal Formulation: With patient/family Time For Goal Achievement: 05/15/22 Potential to Achieve Goals: Good Progress towards PT goals: Progressing toward goals;Goals met and updated - see care plan    Frequency    Min 2X/week      PT Plan Current plan remains appropriate    Co-evaluation              AM-PAC PT "6 Clicks" Mobility   Outcome Measure  Help needed turning from your back to your side while in a flat bed without using bedrails?: A Little Help needed moving from lying on your back to sitting on the side of a flat bed without using bedrails?: A Little Help needed moving to and from a bed to a chair (including a wheelchair)?: A Little Help needed standing up from a chair using your arms (e.g., wheelchair or bedside chair)?: A Little Help needed to walk in hospital room?: A Little Help needed climbing 3-5 steps with a railing? : A Lot 6 Click Score: 17    End of Session Equipment Utilized During Treatment: Gait belt;Back brace Activity Tolerance: Patient tolerated treatment well Patient left: in chair;with call bell/phone within reach;with chair alarm set Nurse Communication: Mobility status PT Visit Diagnosis: Difficulty in walking, not elsewhere classified (R26.2);Muscle weakness (generalized) (M62.81);Other symptoms and signs involving the nervous system (R29.898);History of falling (Z91.81)     Time: 1100-1137 PT Time Calculation (min) (ACUTE ONLY): 37 min  Charges:  $Gait Training: 8-22 mins $Therapeutic Activity: 8-22 mins                    Blondell Reveal Kistler PT 05/04/2022  Acute Rehabilitation Services  Office [phone]

## 2022-05-04 NOTE — Plan of Care (Signed)

## 2022-05-05 DIAGNOSIS — B9629 Other Escherichia coli [E. coli] as the cause of diseases classified elsewhere: Secondary | ICD-10-CM | POA: Diagnosis not present

## 2022-05-05 DIAGNOSIS — Z515 Encounter for palliative care: Secondary | ICD-10-CM | POA: Diagnosis not present

## 2022-05-05 DIAGNOSIS — R531 Weakness: Secondary | ICD-10-CM | POA: Diagnosis not present

## 2022-05-05 DIAGNOSIS — Z1612 Extended spectrum beta lactamase (ESBL) resistance: Secondary | ICD-10-CM | POA: Diagnosis not present

## 2022-05-05 DIAGNOSIS — N390 Urinary tract infection, site not specified: Secondary | ICD-10-CM | POA: Diagnosis not present

## 2022-05-05 DIAGNOSIS — G20 Parkinson's disease: Secondary | ICD-10-CM | POA: Diagnosis not present

## 2022-05-05 LAB — BASIC METABOLIC PANEL
Anion gap: 5 (ref 5–15)
BUN: 24 mg/dL — ABNORMAL HIGH (ref 8–23)
CO2: 29 mmol/L (ref 22–32)
Calcium: 8.7 mg/dL — ABNORMAL LOW (ref 8.9–10.3)
Chloride: 104 mmol/L (ref 98–111)
Creatinine, Ser: 0.84 mg/dL (ref 0.61–1.24)
GFR, Estimated: >60 mL/min (ref >60)
Glucose, Bld: 173 mg/dL — ABNORMAL HIGH (ref 70–99)
Potassium: 3.9 mmol/L (ref 3.5–5.1)
Sodium: 138 mmol/L (ref 135–145)

## 2022-05-05 LAB — GLUCOSE, CAPILLARY
Glucose-Capillary: 288 mg/dL — ABNORMAL HIGH (ref 70–99)
Glucose-Capillary: 185 mg/dL — ABNORMAL HIGH (ref 70–99)
Glucose-Capillary: 263 mg/dL — ABNORMAL HIGH (ref 70–99)
Glucose-Capillary: 234 mg/dL — ABNORMAL HIGH (ref 70–99)
Glucose-Capillary: 278 mg/dL — ABNORMAL HIGH (ref 70–99)

## 2022-05-05 LAB — BRAIN NATRIURETIC PEPTIDE: B Natriuretic Peptide: 435.5 pg/mL — ABNORMAL HIGH (ref 0.0–100.0)

## 2022-05-05 LAB — CULTURE, BLOOD (ROUTINE X 2)
Culture: NO GROWTH
Special Requests: ADEQUATE

## 2022-05-05 MED ORDER — METOPROLOL SUCCINATE ER 25 MG PO TB24
25.0000 mg | ORAL_TABLET | Freq: Every day | ORAL | Status: DC
  Administered 2022-05-05 – 2022-05-08 (×4): 25 mg via ORAL
  Filled 2022-05-05 (×4): qty 1

## 2022-05-05 MED ORDER — LOSARTAN POTASSIUM 25 MG PO TABS
12.5000 mg | ORAL_TABLET | Freq: Every day | ORAL | Status: DC
  Filled 2022-05-05: qty 0.5

## 2022-05-05 MED ORDER — POTASSIUM CHLORIDE CRYS ER 20 MEQ PO TBCR
20.0000 meq | EXTENDED_RELEASE_TABLET | Freq: Two times a day (BID) | ORAL | Status: DC
  Administered 2022-05-05 – 2022-05-08 (×7): 20 meq via ORAL
  Filled 2022-05-05 (×7): qty 1

## 2022-05-05 MED ORDER — FUROSEMIDE 10 MG/ML IJ SOLN
40.0000 mg | Freq: Once | INTRAMUSCULAR | Status: AC
  Administered 2022-05-05: 40 mg via INTRAVENOUS
  Filled 2022-05-05: qty 4

## 2022-05-05 MED ORDER — DIPHENHYDRAMINE HCL 25 MG PO CAPS
25.0000 mg | ORAL_CAPSULE | Freq: Four times a day (QID) | ORAL | Status: AC | PRN
  Administered 2022-05-05 – 2022-05-07 (×2): 25 mg via ORAL
  Filled 2022-05-05 (×2): qty 1

## 2022-05-05 MED ORDER — INSULIN ASPART 100 UNIT/ML IJ SOLN
3.0000 [IU] | Freq: Three times a day (TID) | INTRAMUSCULAR | Status: DC
  Administered 2022-05-05 – 2022-05-08 (×9): 3 [IU] via SUBCUTANEOUS

## 2022-05-05 NOTE — Progress Notes (Signed)
PROGRESS NOTE    [name]  JYN:[id number] DOB: 04/02/1946 DOA: 04/29/2022 PCP: Josetta Huddle, MD  Brief Narrative:  [name] is a 76 y.o. 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  Recently hospitalized 7/7-7/9 for same treated with meropenem, has been weaker since then -Brought to the ED 7/16 evening with significant weakness, febrile to 101.4, UA abnormal again, chest x-ray was unremarkable, also  followed by ID for recurrent ESBL UTIs  Assessment & Plan:  Recurrent ESBL UTI -sepsis on admission ruled out -Recent admission for Klebsiella ESBL and few earlier this year -Cultures unchanged -seen by urology. CT pelvis without prostatic abscess. Will need to follow up with urology -ID following with recommendations to complete 5 days of meropenem. Will follow up with ID as outpatient  Dysphagia -worsening over the past week. 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    Diabetes ([place]) -CBGs are stable continue sliding scale insulin   Parkinson disease ([place]) Worsening weakness likely due to current UTI -continue home Sinemet, entacapone and requip - PT/OT consult -SNF placement  DVT prophylaxis: lovenox Code Status: full Family Communication: Discussed with daughter and wife at bedside Dispo: To be determined, PT OT with recs for SNF  Consultants:  ID Urology Palliative care cardiology   Antimicrobials:  Meropenem, completed 5 days     Subjective: He denies any complaints today  Objective: Vitals:   05/04/22 1420 05/04/22 2012 05/05/22 0634 05/05/22 1247  BP: 123/75 119/70 140/61 137/88  Pulse: 69 72 71 69  Resp: '16 18 18 15  '$ Temp: 97.7 F (36.5 C) 98.4 F (36.9 C) 98.2 F (36.8 C) 98.3 F (36.8 C)  TempSrc: Oral Oral Oral Oral  SpO2: 98% 99% 97% 97%  Weight:      Height:        Intake/Output Summary (Last 24 hours) at 05/05/2022 1332 Last data filed at 05/05/2022 1202 Gross per 24 hour  Intake 1197 ml  Output 4550 ml  Net -3353 ml   Filed Weights   04/29/22 1629 05/02/22 0936  Weight: 86.2 kg 86.2 kg    Examination:  General exam: Chronically ill male sitting up in bed, awake alert oriented to self and place, cognitive deficits noted CVS: S1-S2, regular rhythm Lungs: Bilateral crackles, improving from yesterday Abdomen: Soft, obese, nontender, bowel sounds present Extremities: 1+ edema bilaterally, more on left (chronic) Psych: Flat affect    Data Reviewed: I have personally reviewed following labs and imaging studies  CBC: Recent Labs  Lab 04/29/22 1640 04/30/22 0440 05/02/22 0539  WBC 9.8 10.1 6.5  NEUTROABS 8.1*  --   --   HGB 13.3 12.2* 12.4*  HCT 40.8 36.5* 37.6*  MCV 91.5 90.6 90.8  PLT 245 222 865   Basic Metabolic Panel: Recent Labs  Lab 05/01/22 0559 05/02/22 0539 05/03/22 0530 05/04/22 0345 05/05/22 0319  NA 134* 130* 137 138 138  K 4.3 4.2 4.1 3.7 3.9  CL 101 97* 104 103 104  CO2 '26 28 26 28 29  '$ GLUCOSE 153* 206* 250* 168* 173*  BUN '16 20 17 17 '$ 24*  CREATININE 1.06 1.12 0.95 0.91 0.84  CALCIUM 8.7* 8.3* 8.4* 8.6* 8.7*   GFR: Estimated Creatinine Clearance: 83.4 mL/min (by C-G formula based on SCr of 0.84 mg/dL). Liver Function Tests: Recent Labs  Lab 04/29/22 1640  AST 10*  ALT 5  ALKPHOS 89  BILITOT 0.6  PROT 6.8  ALBUMIN 3.3*   No results for input(s): "LIPASE", "AMYLASE" in the last 168 hours. No results for input(s): "AMMONIA" in the last  168 hours. Coagulation Profile: Recent Labs  Lab 04/29/22 1750  INR 1.0   Cardiac Enzymes: No results for input(s): "CKTOTAL", "CKMB", "CKMBINDEX", "TROPONINI" in the last 168 hours. BNP (last 3 results) No results for input(s): "PROBNP" in the last 8760 hours. HbA1C: No results for input(s): "HGBA1C" in the last 72 hours. CBG: Recent Labs  Lab 05/04/22 1140 05/04/22 1642 05/04/22 2013 05/05/22 0714 05/05/22 1151  GLUCAP 335* 244* 288* 185* 263*   Lipid Profile: No results for input(s): "CHOL", "HDL", "LDLCALC", "TRIG", "CHOLHDL", "LDLDIRECT" in the last 72 hours. Thyroid Function Tests: No results for input(s): "TSH", "T4TOTAL", "FREET4", "T3FREE", "THYROIDAB" in the last 72 hours. Anemia Panel: No results for input(s): "VITAMINB12", "FOLATE", "FERRITIN", "TIBC", "IRON", "RETICCTPCT" in the last 72 hours. Urine analysis:    Component Value Date/Time   COLORURINE YELLOW 04/29/2022 1640   APPEARANCEUR HAZY (A) 04/29/2022 1640   LABSPEC 1.015 04/29/2022 1640   PHURINE 6.0 04/29/2022 1640   GLUCOSEU 150 (A) 04/29/2022 1640   HGBUR SMALL (A) 04/29/2022 1640   BILIRUBINUR NEGATIVE 04/29/2022 1640   KETONESUR 5 (A) 04/29/2022 1640   PROTEINUR 100 (A) 04/29/2022 1640   UROBILINOGEN 0.2 10/25/2009 1404   NITRITE POSITIVE (A) 04/29/2022 1640   LEUKOCYTESUR LARGE (A) 04/29/2022 1640   Sepsis Labs: '@LABRCNTIP'$ (procalcitonin:4,lacticidven:4)  ) Recent Results (from the past 240 hour(s))  Blood Culture (routine x 2)     Status: None   Collection Time: 04/29/22  4:37 PM   Specimen: BLOOD  Result Value Ref Range Status   Specimen Description   Final    BLOOD BLOOD LEFT HAND Performed at [facility], [place] [address], [place], [place] [address]    Special Requests   Final    BOTTLES DRAWN AEROBIC ONLY Blood Culture results may not be optimal due to an inadequate volume of blood received in culture bottles Performed at [place] [address], [place], [place] [address]    Culture   Final    NO GROWTH 5 DAYS Performed at [facility], [place] [address] [place], [place] [address]    Report Status 05/04/2022 FINAL  Final  Resp Panel by RT-PCR (Flu A&B, Covid) Anterior Nasal Swab     Status: None   Collection Time: 04/29/22  4:40 PM   Specimen: Anterior Nasal Swab  Result Value Ref Range Status   SARS Coronavirus 2 by RT PCR NEGATIVE NEGATIVE Final    Comment: (NOTE) SARS-CoV-2 target nucleic acids are NOT DETECTED.  The SARS-CoV-2 RNA is generally detectable in upper respiratory specimens during the acute phase of infection. The lowest concentration of SARS-CoV-2 viral copies this assay can detect is 138 copies/mL. A negative result does not preclude SARS-Cov-2 infection and should not be used as the sole basis for treatment or other patient management decisions. A negative result may occur with  improper specimen collection/handling, submission of specimen other than nasopharyngeal swab, presence of viral mutation(s) within the areas targeted by this assay,  and inadequate number of viral copies(<138 copies/mL). A negative result must be combined with clinical observations, patient history, and epidemiological information. The expected result is Negative.  Fact Sheet for Patients:  EntrepreneurPulse.com.au  Fact Sheet for Healthcare Providers:  IncredibleEmployment.be  This test is no t yet approved or cleared by the [place] FDA and  has been authorized for detection and/or diagnosis of SARS-CoV-2 by FDA under an Emergency Use Authorization (EUA). This EUA will remain  in effect (meaning this test can be used) for the duration of the COVID-19 declaration under Section 564(b)(1) of the Act, 21 U.S.C.section 360bbb-3(b)(1), unless the authorization is terminated  or revoked sooner.       Influenza A by PCR NEGATIVE NEGATIVE Final   Influenza B by PCR NEGATIVE  NEGATIVE Final    Comment: (NOTE) The Xpert Xpress SARS-CoV-2/FLU/RSV plus assay is intended as an aid in the diagnosis of influenza from Nasopharyngeal swab specimens and should not be used as a sole basis for treatment. Nasal washings and aspirates are unacceptable for Xpert Xpress SARS-CoV-2/FLU/RSV testing.  Fact Sheet for Patients: EntrepreneurPulse.com.au  Fact Sheet for Healthcare Providers: IncredibleEmployment.be  This test is not yet approved or cleared by the [place] FDA and has been authorized for detection and/or diagnosis of SARS-CoV-2 by FDA under an Emergency Use Authorization (EUA). This EUA will remain in effect (meaning this test can be used) for the duration of the COVID-19 declaration under Section 564(b)(1) of the Act, 21 U.S.C. section 360bbb-3(b)(1), unless the authorization is terminated or revoked.  Performed at [facility], [place] [address] [place], [place] [address]   Urine Culture     Status: Abnormal   Collection Time: 04/29/22  4:40 PM   Specimen: In/Out Cath Urine  Result Value Ref Range Status   Specimen Description   Final    IN/OUT CATH URINE Performed at [place] [address], [place], [place] [address]    Special Requests   Final    NONE Performed at [facility], [place] [address] [place], [place] [address]    Culture (A)  Final    >=100,000 COLONIES/mL KLEBSIELLA PNEUMONIAE Confirmed Extended Spectrum Beta-Lactamase Producer (ESBL).  In bloodstream infections from ESBL organisms, carbapenems are preferred over piperacillin/tazobactam. They are shown to have a lower risk of mortality.    Report Status 05/01/2022 FINAL  Final   Organism ID, Bacteria KLEBSIELLA PNEUMONIAE (A)  Final      Susceptibility   Klebsiella pneumoniae - MIC*    AMPICILLIN >=32 RESISTANT Resistant     CEFAZOLIN >=64 RESISTANT Resistant     CEFEPIME >=32  RESISTANT Resistant     CEFTRIAXONE >=64 RESISTANT Resistant     CIPROFLOXACIN >=4 RESISTANT Resistant     GENTAMICIN >=16 RESISTANT Resistant     IMIPENEM 0.5 SENSITIVE Sensitive     NITROFURANTOIN 256 RESISTANT Resistant     TRIMETH/SULFA >=320 RESISTANT Resistant     AMPICILLIN/SULBACTAM >=32 RESISTANT Resistant     PIP/TAZO >=128 RESISTANT Resistant     * >=100,000 COLONIES/mL KLEBSIELLA PNEUMONIAE  Blood Culture (routine x 2)     Status: None   Collection Time: 04/29/22  6:00 PM   Specimen: BLOOD  Result Value Ref Range Status   Specimen Description   Final    BLOOD LEFT ANTECUBITAL Performed at [place] [address] [place],  [address]    Special Requests   Final    BOTTLES DRAWN AEROBIC AND  ANAEROBIC Blood Culture adequate volume Performed at [place] [address], [place], [place] [address]    Culture   Final    NO GROWTH 5 DAYS Performed at [facility], [place] [address] [place], [place] [address]    Report Status 05/05/2022 FINAL  Final         Radiology Studies: ECHOCARDIOGRAM COMPLETE  Result Date: 05/04/2022    ECHOCARDIOGRAM REPORT   Patient Name:   DEAKEN JURGENS Date of Exam: 05/04/2022 Medical Rec #:  [id number]   Height:       72.0 in Accession #:    [id number]  Weight:       190.0 lb Date of Birth:  [birth year]   BSA:          2.085 m Patient Age:    56 years    BP:           130/75 mmHg Patient Gender: M           HR:           70 bpm. Exam Location:  Inpatient Procedure: 2D Echo, Cardiac Doppler and Color Doppler Indications:    CHF  History:        Patient has prior history of Echocardiogram examinations. Risk                 Factors:Hypertension and Diabetes.  Sonographer:    Jyl Heinz Referring Phys: [place]  1. Inferior septal and apical hypokinesis . Left ventricular ejection fraction, by estimation, is 45 to 50%. The left ventricle has mildly decreased function. The  left ventricle demonstrates regional wall motion abnormalities (see scoring diagram/findings for description). The left ventricular internal cavity size was moderately dilated. There is moderate left ventricular hypertrophy. Left ventricular diastolic parameters are indeterminate.  2. Right ventricular systolic function is normal. The right ventricular size is normal.  3. Left atrial size was mild to moderately dilated.  4. The mitral valve is abnormal. Trivial mitral valve regurgitation. No evidence of mitral stenosis.  5. The aortic valve is tricuspid. There is mild calcification of the aortic valve. There is mild thickening of the aortic valve. Aortic valve regurgitation is not visualized. Aortic valve sclerosis is present, with no evidence of aortic valve stenosis.  6. The inferior vena cava is dilated in size with >50% respiratory variability, suggesting right atrial pressure of 8 mmHg. FINDINGS  Left Ventricle: Inferior septal and apical hypokinesis. Left ventricular ejection fraction, by estimation, is 45 to 50%. The left ventricle has mildly decreased function. The left ventricle demonstrates regional wall motion abnormalities. The left ventricular internal cavity size was moderately dilated. There is moderate left ventricular hypertrophy. Left ventricular diastolic parameters are indeterminate. Right Ventricle: The right ventricular size is normal. No increase in right ventricular wall thickness. Right ventricular systolic function is normal. Left Atrium: Left atrial size was mild to moderately dilated. Right Atrium: Right atrial size was normal in size. Pericardium: There is no evidence of pericardial effusion. Mitral Valve: The mitral valve is abnormal. There is mild thickening of the mitral valve leaflet(s). There is mild calcification of the mitral valve leaflet(s). Trivial mitral valve regurgitation. No evidence of mitral valve stenosis. Tricuspid Valve: The tricuspid valve is normal in structure.  Tricuspid valve regurgitation is mild . No evidence of tricuspid stenosis. Aortic Valve: The aortic valve is tricuspid. There is mild calcification of the aortic valve. There is mild thickening of the aortic valve. Aortic valve  regurgitation is not visualized. Aortic valve sclerosis is present, with no evidence of aortic valve stenosis. Aortic valve peak gradient measures 8.6 mmHg. Pulmonic Valve: The pulmonic valve was normal in structure. Pulmonic valve regurgitation is not visualized. No evidence of pulmonic stenosis. Aorta: The aortic root is normal in size and structure. Venous: The inferior vena cava is dilated in size with greater than 50% respiratory variability, suggesting right atrial pressure of 8 mmHg. IAS/Shunts: No atrial level shunt detected by color flow Doppler.  LEFT VENTRICLE PLAX 2D LVIDd:         5.50 cm      Diastology LVIDs:         4.60 cm      LV e' medial:    5.44 cm/s LV PW:         1.30 cm      LV E/e' medial:  13.2 LV IVS:        1.50 cm      LV e' lateral:   6.64 cm/s LVOT diam:     2.00 cm      LV E/e' lateral: 10.8 LV SV:         68 LV SV Index:   33 LVOT Area:     3.14 cm  LV Volumes (MOD) LV vol d, MOD A2C: 156.0 ml LV vol d, MOD A4C: 161.0 ml LV vol s, MOD A2C: 73.3 ml LV vol s, MOD A4C: 76.4 ml LV SV MOD A2C:     82.7 ml LV SV MOD A4C:     161.0 ml LV SV MOD BP:      85.3 ml RIGHT VENTRICLE             IVC RV Basal diam:  2.80 cm     IVC diam: 2.70 cm RV Mid diam:    2.70 cm RV S prime:     12.00 cm/s TAPSE (M-mode): 1.6 cm LEFT ATRIUM             Index        RIGHT ATRIUM           Index LA diam:        4.20 cm 2.01 cm/m   RA Area:     14.40 cm LA Vol (A2C):   66.2 ml 31.76 ml/m  RA Volume:   33.20 ml  15.93 ml/m LA Vol (A4C):   91.0 ml 43.65 ml/m LA Biplane Vol: 79.5 ml 38.14 ml/m  AORTIC VALVE AV Area (Vmax): 2.02 cm AV Vmax:        147.00 cm/s AV Peak Grad:   8.6 mmHg LVOT Vmax:      94.60 cm/s LVOT Vmean:     72.400 cm/s LVOT VTI:       0.216 m  AORTA Ao Root diam:  3.10 cm Ao Asc diam:  3.40 cm MITRAL VALVE               TRICUSPID VALVE MV Area (PHT): 5.09 cm    TR Peak grad:   34.8 mmHg MV Decel Time: 149 msec    TR Vmax:        295.00 cm/s MV E velocity: 71.80 cm/s MV A velocity: 73.20 cm/s  SHUNTS MV E/A ratio:  0.98        Systemic VTI:  0.22 m                            Systemic  Diam: 2.00 cm Jenkins Rouge MD Electronically signed by Jenkins Rouge MD Signature Date/Time: 05/04/2022/2:24:43 PM    Final    DG CHEST PORT 1 VIEW  Result Date: 05/03/2022 CLINICAL DATA:  Shortness of breath. EXAM: PORTABLE CHEST 1 VIEW COMPARISON:  Chest radiograph 04/30/2022. FINDINGS: The patient is rotated limiting assessment. Apparent rightward shift of the heart is likely due to rotation. Mild cardiomegaly. Question of septal thickening at the right lung base. Small pleural effusions. Trace fluid in the right minor fissure. No pneumothorax. IMPRESSION: Small pleural effusions. Question of septal thickening at the right lung base, suspicious for pulmonary edema. Mild cardiomegaly. Electronically Signed   By: Keith Rake M.D.   On: 05/03/2022 18:12        Scheduled Meds:  carbidopa-levodopa  3 tablet Oral 5 times per day   Chlorhexidine Gluconate Cloth  6 each Topical Daily   cholecalciferol  2,000 Units Oral Daily   docusate sodium  100 mg Oral BID   enoxaparin (LOVENOX) injection  40 mg Subcutaneous Q24H   entacapone  200 mg Oral 3 times per day   insulin aspart  0-9 Units Subcutaneous TID PC & HS   insulin glargine-yfgn  10 Units Subcutaneous Daily   methenamine  1,000 mg Oral BID   metoprolol succinate  25 mg Oral Daily   pantoprazole  40 mg Oral Daily   potassium chloride SA  20 mEq Oral BID   rOPINIRole  1.5 mg Oral QHS   rOPINIRole  3 mg Oral BID AC   sodium chloride flush  10-40 mL Intracatheter Q12H   tamsulosin  0.4 mg Oral BID   vitamin B-12  1,000 mcg Oral Daily   vitamin C  250 mg Oral BID   Continuous Infusions:     LOS: 6 days      Kathie Dike, MD [facility]   05/05/2022, 1:32 PM

## 2022-05-05 NOTE — Consult Note (Signed)
Cardiology Consultation Note   Patient ID: [name] MRN: [id number]; DOB: 11-21-1945   Admission date: 04/29/2022  PCP:  Josetta Huddle, MD   [facility] Providers Cardiologist:  Dr. Marlou Porch  Chief Complaint:  New LV dysfunction  Patient Profile:   [name] is a 76 y.o. male with HTN with DM, HLD with DM, Parkinson's Disease stable as of 07/24/21 (last cardiology visit who is being seen 05/05/2022 for the evaluation of new LV dysfunction in the setting of volume overload and sepsis from UTI (recurrent in March and May).  History of Present Illness:   [name] notes that he is feeling well in February of this year. He was able to walk with a cane and work at his property (owns a country club).  Has always had leg swelling since each of his knee surgeries.   Notes that he has had issues with BID diuretics. His dry weight prior to his multiple hospital admissions was 190.  He has had a MDR UTI with multiple admission. He has had a new fall.   As part of his assessment this admission for his UTI, he has worsening SOB and tachypnea.  BNP was elevated.  Found to have EF 45%.  Has have significant diuretic response with - 3.3 L in total.  Still have some SOB.  Has had no chest pain, chest pressure, chest tightness, chest stinging. Now walks with a walker with assist, related to both his Parkinson's getting worse and deconditioning.    Notes SOB at rest.  No PND or orthopnea.  No weight gain, leg swelling is chronic, but with no abdominal swelling.  No syncope or near syncope.   Notes  no palpitations or funny heart beats even during PVCs    Inpatient evaluation because of multi-drug resistant UTI . Cardiology called for evaluation.   Past Medical History:  Diagnosis Date   Abnormal involuntary movement 02/18/2018   Allergic rhinitis    Arthritis    BPH (benign prostatic hyperplasia)    Diabetes ([place])    Dysphagia, pharyngeal phase    GERD diagnosed on barium swallow. Has small  hiatal hernia. Symptomatically somewhat better on omeprazole but not entirely. We'll try b.i.d. therapy   Edema    1+ in both ankles, likely multifactorial including medication such as Requip   Erectile dysfunction    Staxyn 10 mg or Viagra worked well. 3 samples of Cialis 20 mg provided   GERD (gastroesophageal reflux disease)    Hypercholesteremia    Hypertension    Nephrolithiasis    Onychomycosis of toenail    April 27, 2013 - Dr. Inocencio Homes - podiatry, was in [place] - treating with oral Lamisil and topical nail therapy   Parkinson's disease [facility])     followed by Dr. Lezlie Octave at [facility] and [name], M.D. in [place]   Presbycusis    and tinnitus - Dr. Izora Gala - August/2013   Renal calculus    Syncope     Past Surgical History:  Procedure Laterality Date   HAND SURGERY     INGUINAL HERNIA REPAIR Left 11/04/2015   Procedure: LEFT INGUINAL HERNIA REPAIR WITH MESH;  Surgeon: Armandina Gemma, MD;  Location: [place];  Service: General;  Laterality: Left;   INSERTION OF MESH Left 11/04/2015   Procedure: INSERTION OF MESH;  Surgeon: Armandina Gemma, MD;  Location: [place];  Service: General;  Laterality: Left;   JOINT REPLACEMENT Bilateral    KNEE  SURGERY     TOTAL KNEE ARTHROPLASTY       Medications Prior to Admission: Prior to Admission medications   Medication Sig Start Date End Date Taking? Authorizing Provider  acetaminophen (TYLENOL) 500 MG tablet Take 1,000 mg by mouth every 6 (six) hours as needed (for pain).   Yes [provider]  ALEVE 220 MG tablet Take 660 mg by mouth every 6 (six) hours as needed (for pain).   Yes [provider]  amoxicillin (AMOXIL) 500 MG capsule Take 2,000 mg by mouth See admin instructions. Take 2,000 mg by mouth one hour prior to dental appointments   Yes [provider]  Ascorbic Acid (VITAMIN C) 100 MG tablet Take 100 mg by mouth in the morning and at bedtime.   Yes [provider]  calcium carbonate (TUMS) 500 MG chewable tablet Chew 2 tablets by mouth daily as needed for indigestion or heartburn.   Yes [provider]  carbidopa-levodopa (SINEMET IR) 25-100 MG tablet Take 3 tablets by mouth 4 (four) times daily. Patient taking differently: Take 3 tablets by mouth See admin instructions. Take 3 tablets by mouth at 6 AM, 9 AM, 12 NOON, 3 PM, and 6 PM, but can sometimes average only a sum total of 13 tablets/day 03/23/22  Yes [name], [name], DO  cholecalciferol (VITAMIN D) 1000 UNITS tablet Take 2,000 Units by mouth daily.   Yes [provider]  clotrimazole (LOTRIMIN AF) 1 % cream Apply 1 application. topically 2 (two) times daily. Patient taking differently: Apply 1 application  topically 2 (two) times daily as needed (for yeast infections resulting from taking antibiotics). 03/13/22  Yes Vu, [name], MD  entacapone (COMTAN) 200 MG tablet TAKE 1 TABLET BY MOUTH 3 TIMES DAILY (WITH FIRST THREE DOSAGES OF CARBIDOPA/LEVODOPA). Patient taking differently: Take 200 mg by mouth See admin instructions. Take 200 mg by mouth at 6 AM, 9 AM, and 12 NOON 04/02/22  Yes [name], [name], DO  furosemide (LASIX) 20 MG tablet Take 2 tablets (40 mg total) by mouth daily. Patient taking differently: Take 20 mg by mouth daily as needed for fluid or edema. 03/07/22  Yes [name], MD  hydrocortisone cream 0.5 % Apply 1 application. topically 2 (two) times daily. Patient taking differently: Apply 1 application  topically 2 (two) times daily as needed for itching. 03/13/22  Yes Vu, [name], MD  LANTUS SOLOSTAR 100 UNIT/ML Solostar Pen Inject 15-20 Units into the skin at bedtime. 05/03/21  Yes [provider]  lisinopril (ZESTRIL) 5 MG tablet Take 5 mg by mouth daily. 12/06/21  Yes [provider]  metFORMIN (GLUCOPHAGE) 500 MG tablet Take 500 mg by mouth 2 (two) times daily with a meal.   Yes [provider]  methenamine (MANDELAMINE) 1 g tablet Take  1 tablet (1,000 mg total) by mouth 2 (two) times daily. Take with any vitamin c, twice a day for uti prevention Patient taking differently: Take 1,000 mg by mouth in the morning and at bedtime. 03/13/22 03/08/23 Yes Vu, [name], MD  nitrofurantoin, macrocrystal-monohydrate, (MACROBID) 100 MG capsule Take 1 capsule (100 mg total) by mouth daily. Patient taking differently: Take 100 mg by mouth 2 (two) times daily. 03/28/22 03/23/23 Yes Vu, [name], MD  omeprazole (PRILOSEC) 20 MG capsule Take 20 mg by mouth daily as needed (acid reflux).   Yes [provider]  potassium chloride SA (KLOR-CON) 20 MEQ tablet Take 10 mEq by mouth 2 (two) times daily.  06/16/21  Yes [provider]  rOPINIRole (REQUIP) 3 MG tablet Take 1 tablet in the morning, Take  1 in the afternoon, Take a  half tablet in the evening Patient taking differently: Take 1.5-3 mg by mouth See admin instructions. Take 3 mg by mouth in the morning and afternoon. Take 1.5 mg in the evening. 02/27/22  Yes [name], [name], DO  tamsulosin (FLOMAX) 0.4 MG CAPS capsule Take 0.4 mg by mouth in the morning and at bedtime. 01/24/18  Yes [provider]  traMADol (ULTRAM) 50 MG tablet Take 50 mg by mouth every 6 (six) hours as needed (for pain).   Yes [provider]  triamcinolone (NASACORT) 55 MCG/ACT AERO nasal inhaler Place 2 sprays into the nose daily as needed (for allergies or rhinitis).   Yes [provider]  vitamin B-12 (CYANOCOBALAMIN) 1000 MCG tablet Take 1,000 mcg by mouth daily.   Yes [provider]  GLOBAL EASE INJECT PEN NEEDLES 31G X 5 MM MISC Inject into the skin. 05/09/21   [provider]     Allergies:    Allergies  Allergen Reactions   Nucynta [Tapentadol] Other (See Comments)    Made the patient not feel well. "He did not feel well at all."   Oxycodone Itching   Sudafed [Pseudoephedrine Hcl] Other (See Comments)    Problems urinating    Tramadol Hcl Itching and Other (See  Comments)    Can tolerate, however (in 2023)    Social History:   Social History   Socioeconomic History   Marital status: Married    Spouse name: Not on file   Number of children: Not on file   Years of education: Not on file   Highest education level: Not on file  Occupational History   Not on file  Tobacco Use   Smoking status: Never   Smokeless tobacco: Never  Vaping Use   Vaping Use: Never used  Substance and Sexual Activity   Alcohol use: No   Drug use: No   Sexual activity: Not on file  Other Topics Concern   Not on file  Social History Narrative   Right Handed    Lives 3 story home   Social Determinants of Health   Financial Resource Strain: Not on file  Food Insecurity: Not on file  Transportation Needs: Not on file  Physical Activity: Not on file  Stress: Not on file  Social Connections: Not on file  Intimate Partner Violence: Not on file    Family History:   The patient's family history includes Atrial fibrillation in his mother; Breast cancer in his mother; Cancer in his brother; Emphysema in his father; Heart disease in his father.    ROS:  Please see the history of present illness.  All other ROS reviewed and negative.     Physical Exam/Data:   Vitals:   05/04/22 0918 05/04/22 1420 05/04/22 2012 05/05/22 0634  BP: (!) 143/69 123/75 119/70 140/61  Pulse: 74 69 72 71  Resp: '16 16 18 18  '$ Temp: 97.7 F (36.5 C) 97.7 F (36.5 C) 98.4 F (36.9 C) 98.2 F (36.8 C)  TempSrc: Axillary Oral Oral Oral  SpO2: 99% 98% 99% 97%  Weight:      Height:        Intake/Output Summary (Last 24 hours) at 05/05/2022 0735 Last data filed at 05/05/2022 0500 Gross per 24 hour  Intake 1667 ml  Output 3650 ml  Net -1983 ml  05/02/2022    9:36 AM 04/29/2022    4:29 PM 04/25/2022    3:34 PM  Last 3 Weights  Weight (lbs) 190 lb 190 lb 189 lb  Weight (kg) 86.183 kg 86.183 kg 85.73 kg     Body mass index is 25.77 kg/m.   Gen: No distress  Neck: JVD to  the mid neck at 45 degrees with HJR,  Cardiac: No Rubs or Gallops, no Murmur, RRR +2 radial pulses Respiratory: Persistent crackles left side, normal effort, normal  respiratory rate GI: Soft, nontender, non-distended  MS: Non pitting edema bilaterally;  moves all extremities Integument: Skin feels warm Neuro:  At time of evaluation, alert and oriented to person/place/time/situation  Psych: Normal affect, patient feels a bit dejected and tired   EKG:  The ECG that was done  was personally reviewed and demonstrates SR iLBBB Occasional PVCs  Relevant CV Studies: Echo EF 45-50%  IV diameter 2.7 cm Frequent Ectopy may explain concern for WMA, focal WMA improve on non PVC beats  Laboratory Data:  High Sensitivity Troponin:  No results for input(s): "TROPONINIHS" in the last 720 hours.    Chemistry Recent Labs  Lab 05/04/22 0345 05/05/22 0319  NA 138 138  K 3.7 3.9  CL 103 104  CO2 28 29  GLUCOSE 168* 173*  BUN 17 24*  CREATININE 0.91 0.84  CALCIUM 8.6* 8.7*  GFRNONAA >60 >60  ANIONGAP 7 5    Recent Labs  Lab 04/29/22 1640  PROT 6.8  ALBUMIN 3.3*  AST 10*  ALT 5  ALKPHOS 89  BILITOT 0.6   Lipids No results for input(s): "CHOL", "TRIG", "HDL", "LABVLDL", "LDLCALC", "CHOLHDL" in the last 168 hours. Hematology Recent Labs  Lab 04/30/22 0440 05/02/22 0539  WBC 10.1 6.5  RBC 4.03* 4.14*  HGB 12.2* 12.4*  HCT 36.5* 37.6*  MCV 90.6 90.8  MCH 30.3 30.0  MCHC 33.4 33.0  RDW 13.9 14.1  PLT 222 232   Thyroid No results for input(s): "TSH", "FREET4" in the last 168 hours. BNP Recent Labs  Lab 05/03/22 1852 05/05/22 0319  BNP 625.7* 435.5*    DDimer No results for input(s): "DDIMER" in the last 168 hours.   Radiology/Studies:  ECHOCARDIOGRAM COMPLETE  Result Date: 05/04/2022    ECHOCARDIOGRAM REPORT   Patient Name:   [name] Date of Exam: 05/04/2022 Medical Rec #:  [id number]   Height:       72.0 in Accession #:    [id number]  Weight:       190.0 lb Date of  Birth:  [birth year]   BSA:          2.085 m Patient Age:    20 years    BP:           130/75 mmHg Patient Gender: M           HR:           70 bpm. Exam Location:  Inpatient Procedure: 2D Echo, Cardiac Doppler and Color Doppler Indications:    CHF  History:        Patient has prior history of Echocardiogram examinations. Risk                 Factors:Hypertension and Diabetes.  Sonographer:    Jyl Heinz Referring Phys: [place]  1. Inferior septal and apical hypokinesis . Left ventricular ejection fraction, by estimation, is 45 to 50%. The left ventricle has mildly decreased function. The left ventricle  demonstrates regional wall motion abnormalities (see scoring diagram/findings for description). The left ventricular internal cavity size was moderately dilated. There is moderate left ventricular hypertrophy. Left ventricular diastolic parameters are indeterminate.  2. Right ventricular systolic function is normal. The right ventricular size is normal.  3. Left atrial size was mild to moderately dilated.  4. The mitral valve is abnormal. Trivial mitral valve regurgitation. No evidence of mitral stenosis.  5. The aortic valve is tricuspid. There is mild calcification of the aortic valve. There is mild thickening of the aortic valve. Aortic valve regurgitation is not visualized. Aortic valve sclerosis is present, with no evidence of aortic valve stenosis.  6. The inferior vena cava is dilated in size with >50% respiratory variability, suggesting right atrial pressure of 8 mmHg. FINDINGS  Left Ventricle: Inferior septal and apical hypokinesis. Left ventricular ejection fraction, by estimation, is 45 to 50%. The left ventricle has mildly decreased function. The left ventricle demonstrates regional wall motion abnormalities. The left ventricular internal cavity size was moderately dilated. There is moderate left ventricular hypertrophy. Left ventricular diastolic parameters are indeterminate.  Right Ventricle: The right ventricular size is normal. No increase in right ventricular wall thickness. Right ventricular systolic function is normal. Left Atrium: Left atrial size was mild to moderately dilated. Right Atrium: Right atrial size was normal in size. Pericardium: There is no evidence of pericardial effusion. Mitral Valve: The mitral valve is abnormal. There is mild thickening of the mitral valve leaflet(s). There is mild calcification of the mitral valve leaflet(s). Trivial mitral valve regurgitation. No evidence of mitral valve stenosis. Tricuspid Valve: The tricuspid valve is normal in structure. Tricuspid valve regurgitation is mild . No evidence of tricuspid stenosis. Aortic Valve: The aortic valve is tricuspid. There is mild calcification of the aortic valve. There is mild thickening of the aortic valve. Aortic valve regurgitation is not visualized. Aortic valve sclerosis is present, with no evidence of aortic valve stenosis. Aortic valve peak gradient measures 8.6 mmHg. Pulmonic Valve: The pulmonic valve was normal in structure. Pulmonic valve regurgitation is not visualized. No evidence of pulmonic stenosis. Aorta: The aortic root is normal in size and structure. Venous: The inferior vena cava is dilated in size with greater than 50% respiratory variability, suggesting right atrial pressure of 8 mmHg. IAS/Shunts: No atrial level shunt detected by color flow Doppler.  LEFT VENTRICLE PLAX 2D LVIDd:         5.50 cm      Diastology LVIDs:         4.60 cm      LV e' medial:    5.44 cm/s LV PW:         1.30 cm      LV E/e' medial:  13.2 LV IVS:        1.50 cm      LV e' lateral:   6.64 cm/s LVOT diam:     2.00 cm      LV E/e' lateral: 10.8 LV SV:         68 LV SV Index:   33 LVOT Area:     3.14 cm  LV Volumes (MOD) LV vol d, MOD A2C: 156.0 ml LV vol d, MOD A4C: 161.0 ml LV vol s, MOD A2C: 73.3 ml LV vol s, MOD A4C: 76.4 ml LV SV MOD A2C:     82.7 ml LV SV MOD A4C:     161.0 ml LV SV MOD BP:      85.3  ml RIGHT  VENTRICLE             IVC RV Basal diam:  2.80 cm     IVC diam: 2.70 cm RV Mid diam:    2.70 cm RV S prime:     12.00 cm/s TAPSE (M-mode): 1.6 cm LEFT ATRIUM             Index        RIGHT ATRIUM           Index LA diam:        4.20 cm 2.01 cm/m   RA Area:     14.40 cm LA Vol (A2C):   66.2 ml 31.76 ml/m  RA Volume:   33.20 ml  15.93 ml/m LA Vol (A4C):   91.0 ml 43.65 ml/m LA Biplane Vol: 79.5 ml 38.14 ml/m  AORTIC VALVE AV Area (Vmax): 2.02 cm AV Vmax:        147.00 cm/s AV Peak Grad:   8.6 mmHg LVOT Vmax:      94.60 cm/s LVOT Vmean:     72.400 cm/s LVOT VTI:       0.216 m  AORTA Ao Root diam: 3.10 cm Ao Asc diam:  3.40 cm MITRAL VALVE               TRICUSPID VALVE MV Area (PHT): 5.09 cm    TR Peak grad:   34.8 mmHg MV Decel Time: 149 msec    TR Vmax:        295.00 cm/s MV E velocity: 71.80 cm/s MV A velocity: 73.20 cm/s  SHUNTS MV E/A ratio:  0.98        Systemic VTI:  0.22 m                            Systemic Diam: 2.00 cm Jenkins Rouge MD Electronically signed by Jenkins Rouge MD Signature Date/Time: 05/04/2022/2:24:43 PM    Final      Assessment and Plan:   Heart Failure Mildly Reduced Ejection Fraction  UTI and sepsis Frequent PVCs HTN with DM HLD with DM - NYHA class II, Stage C, hypervolemic, etiology from PVCs suspected - Diuretic regimen: good output, will give lasix 40 IV X1 and increase K dosage; given his BNP curve 600-> 400 he may need this for one to two more days before transition to at a minimarum lasix 60 mg PO Daily (hoping to avoid BID dosing) - Will start succinate 25 mg PO daily for PVC suppression and for Hf - holding ARB for BP room for patient - given his complex UTI's will not start SGLT2i this admission - LVEF above 40%, MRA not planned for this admission unless BP room and K needed - I am less sure he has true WMAs, he has risk factors for CAD and frequent PVCs; will plan for outpatient ischemic work up after recovery from UTI - Discussed the importance of  fluid restriction of < 2 L, salt restriction, and checking daily weights  - potential PO transition to diuretic 7/23/2 based on output  Called Daughter [phone] to give update per patient request    Risk Assessment/Risk Scores:    New York Heart Association (NYHA) Functional Class NYHA Class II   For questions or updates, please contact [facility] Please consult [email or web address] for contact info under     Signed, Werner Lean, MD  05/05/2022 7:35 AM

## 2022-05-05 NOTE — TOC Progression Note (Signed)
Transition of Care [facility]) - Progression Note    Patient Details  [name] MRN: [id number] Date of Birth: [birth year]  [facility] CM/SW Contact  Ross Ludwig, [place] Phone Number: 05/05/2022, 10:56 AM  Clinical Narrative:     CSW informed by physician that patient is not medically ready for discharge to SNF today.  CSW updated [facility], and they can accept patient either tomorrow or Monday whenever he is medically ready for discharge.  Insurance authorization is valid through Monday at [place] updated attending physician and Olivia Mackie at [employer].   Expected Discharge Plan: [place] Barriers to Discharge: Continued Medical Work up  Expected Discharge Plan and Services Expected Discharge Plan: [place] In-house Referral: Clinical Social Work Discharge Planning Services: CM Consult Post Acute Care Choice: [place] Living arrangements for the past 2 months: Single Family Home                 DME Arranged: N/A DME Agency: NA                   Social Determinants of Health (SDOH) Interventions    Readmission Risk Interventions    05/01/2022    2:36 PM  Readmission Risk Prevention Plan  Transportation Screening Complete  PCP or Specialist Appt within 5-7 Days Complete  Home Care Screening Complete  Medication Review (RN CM) Complete

## 2022-05-06 DIAGNOSIS — B9629 Other Escherichia coli [E. coli] as the cause of diseases classified elsewhere: Secondary | ICD-10-CM | POA: Diagnosis not present

## 2022-05-06 DIAGNOSIS — R531 Weakness: Secondary | ICD-10-CM | POA: Diagnosis not present

## 2022-05-06 DIAGNOSIS — Z515 Encounter for palliative care: Secondary | ICD-10-CM | POA: Diagnosis not present

## 2022-05-06 DIAGNOSIS — G20 Parkinson's disease: Secondary | ICD-10-CM | POA: Diagnosis not present

## 2022-05-06 DIAGNOSIS — N390 Urinary tract infection, site not specified: Secondary | ICD-10-CM | POA: Diagnosis not present

## 2022-05-06 DIAGNOSIS — Z1612 Extended spectrum beta lactamase (ESBL) resistance: Secondary | ICD-10-CM | POA: Diagnosis not present

## 2022-05-06 LAB — BASIC METABOLIC PANEL
Anion gap: 7 (ref 5–15)
BUN: 29 mg/dL — ABNORMAL HIGH (ref 8–23)
CO2: 29 mmol/L (ref 22–32)
Calcium: 8.7 mg/dL — ABNORMAL LOW (ref 8.9–10.3)
Chloride: 101 mmol/L (ref 98–111)
Creatinine, Ser: 1 mg/dL (ref 0.61–1.24)
GFR, Estimated: >60 mL/min (ref >60)
Glucose, Bld: 213 mg/dL — ABNORMAL HIGH (ref 70–99)
Potassium: 3.8 mmol/L (ref 3.5–5.1)
Sodium: 137 mmol/L (ref 135–145)

## 2022-05-06 LAB — GLUCOSE, CAPILLARY
Glucose-Capillary: 200 mg/dL — ABNORMAL HIGH (ref 70–99)
Glucose-Capillary: 214 mg/dL — ABNORMAL HIGH (ref 70–99)
Glucose-Capillary: 256 mg/dL — ABNORMAL HIGH (ref 70–99)
Glucose-Capillary: 315 mg/dL — ABNORMAL HIGH (ref 70–99)
Glucose-Capillary: 218 mg/dL — ABNORMAL HIGH (ref 70–99)

## 2022-05-06 LAB — BRAIN NATRIURETIC PEPTIDE: B Natriuretic Peptide: 369.1 pg/mL — ABNORMAL HIGH (ref 0.0–100.0)

## 2022-05-06 MED ORDER — LOSARTAN POTASSIUM 25 MG PO TABS
12.5000 mg | ORAL_TABLET | Freq: Every day | ORAL | Status: DC
  Administered 2022-05-06 – 2022-05-08 (×3): 12.5 mg via ORAL
  Filled 2022-05-06 (×3): qty 0.5

## 2022-05-06 MED ORDER — ORAL CARE MOUTH RINSE: 15.0000 mL | OROMUCOSAL | Status: DC | PRN

## 2022-05-06 MED ORDER — FUROSEMIDE 10 MG/ML IJ SOLN
40.0000 mg | Freq: Two times a day (BID) | INTRAMUSCULAR | Status: DC
  Administered 2022-05-06 – 2022-05-07 (×3): 40 mg via INTRAVENOUS
  Filled 2022-05-06 (×3): qty 4

## 2022-05-06 NOTE — Progress Notes (Signed)
Progress Note  Patient [name] Date of Encounter: 05/06/2022  Primary Cardiologist: Candee Furbish, MD   Subjective   Overnight - 2 L. Patient notes worsening ankle swelling No CP, SOB, Palpitations. Wife wants to make sure he is ok to mobilize  Inpatient Medications    Scheduled Meds:  carbidopa-levodopa  3 tablet Oral 5 times per day   Chlorhexidine Gluconate Cloth  6 each Topical Daily   cholecalciferol  2,000 Units Oral Daily   docusate sodium  100 mg Oral BID   enoxaparin (LOVENOX) injection  40 mg Subcutaneous Q24H   entacapone  200 mg Oral 3 times per day   insulin aspart  0-9 Units Subcutaneous TID PC & HS   insulin aspart  3 Units Subcutaneous TID WC   insulin glargine-yfgn  10 Units Subcutaneous Daily   methenamine  1,000 mg Oral BID   metoprolol succinate  25 mg Oral Daily   pantoprazole  40 mg Oral Daily   potassium chloride SA  20 mEq Oral BID   rOPINIRole  1.5 mg Oral QHS   rOPINIRole  3 mg Oral BID AC   sodium chloride flush  10-40 mL Intracatheter Q12H   tamsulosin  0.4 mg Oral BID   vitamin B-12  1,000 mcg Oral Daily   vitamin C  250 mg Oral BID   Continuous Infusions:  PRN Meds: acetaminophen, diphenhydrAMINE, guaiFENesin-dextromethorphan, sodium chloride flush, traMADol   Vital Signs    Vitals:   05/05/22 0634 05/05/22 1247 05/05/22 1950 05/06/22 0621  BP: 140/61 137/88 126/72 124/71  Pulse: 71 69 63 62  Resp: '18 15 18 18  '$ Temp: 98.2 F (36.8 C) 98.3 F (36.8 C) 98 F (36.7 C) 97.9 F (36.6 C)  TempSrc: Oral Oral Oral Oral  SpO2: 97% 97% 99% 95%  Weight:      Height:        Intake/Output Summary (Last 24 hours) at 05/06/2022 0737 Last data filed at 05/06/2022 0500 Gross per 24 hour  Intake 2046 ml  Output 3850 ml  Net -1804 ml   Filed Weights   04/29/22 1629 05/02/22 0936  Weight: 86.2 kg 86.2 kg    Telemetry    NA - Personally Reviewed  Physical Exam   Gen: no distress, thin male   Ears: bilateral Frank  Sign Cardiac: No Rubs or Gallops, no Murmur, RRR +2 radial pulses Respiratory: bibasilar crackles, normal effort, normal  respiratory rate GI: Soft, nontender, non-distended  MS: Non pitting edema;  moves all extremities Integument: bilateral prior knee surgery scars noted Psych: Normal affect, patient feels OK  Labs    Chemistry Recent Labs  Lab 04/29/22 1640 05/01/22 0559 05/04/22 0345 05/05/22 0319 05/06/22 0547  NA 137   < > 138 138 137  K 4.5   < > 3.7 3.9 3.8  CL 104   < > 103 104 101  CO2 20*   < > '28 29 29  '$ GLUCOSE 207*   < > 168* 173* 213*  BUN 17   < > 17 24* 29*  CREATININE 0.98   < > 0.91 0.84 1.00  CALCIUM 8.9   < > 8.6* 8.7* 8.7*  PROT 6.8  --   --   --   --   ALBUMIN 3.3*  --   --   --   --   AST 10*  --   --   --   --   ALT 5  --   --   --   --  ALKPHOS 89  --   --   --   --   BILITOT 0.6  --   --   --   --   GFRNONAA >60   < > >60 >60 >60  ANIONGAP 13   < > '7 5 7   '$ < > = values in this interval not displayed.     Hematology Recent Labs  Lab 04/29/22 1640 04/30/22 0440 05/02/22 0539  WBC 9.8 10.1 6.5  RBC 4.46 4.03* 4.14*  HGB 13.3 12.2* 12.4*  HCT 40.8 36.5* 37.6*  MCV 91.5 90.6 90.8  MCH 29.8 30.3 30.0  MCHC 32.6 33.4 33.0  RDW 14.0 13.9 14.1  PLT 245 222 232    Cardiac EnzymesNo results for input(s): "TROPONINI" in the last 168 hours. No results for input(s): "TROPIPOC" in the last 168 hours.   BNP Recent Labs  Lab 05/03/22 1852 05/05/22 0319  BNP 625.7* 435.5*     DDimer No results for input(s): "DDIMER" in the last 168 hours.   Radiology    ECHOCARDIOGRAM COMPLETE  Result Date: 05/04/2022    ECHOCARDIOGRAM REPORT   Patient Name:   [name] Date of Exam: 05/04/2022 Medical Rec #:  [id number]   Height:       72.0 in Accession #:    [id number]  Weight:       190.0 lb Date of Birth:  [birth year]   BSA:          2.085 m Patient Age:    76 years    BP:           130/75 mmHg Patient Gender: M           HR:           70 bpm. Exam  Location:  Inpatient Procedure: 2D Echo, Cardiac Doppler and Color Doppler Indications:    CHF  History:        Patient has prior history of Echocardiogram examinations. Risk                 Factors:Hypertension and Diabetes.  Sonographer:    Jyl Heinz Referring Phys: Frederica  1. Inferior septal and apical hypokinesis . Left ventricular ejection fraction, by estimation, is 45 to 50%. The left ventricle has mildly decreased function. The left ventricle demonstrates regional wall motion abnormalities (see scoring diagram/findings for description). The left ventricular internal cavity size was moderately dilated. There is moderate left ventricular hypertrophy. Left ventricular diastolic parameters are indeterminate.  2. Right ventricular systolic function is normal. The right ventricular size is normal.  3. Left atrial size was mild to moderately dilated.  4. The mitral valve is abnormal. Trivial mitral valve regurgitation. No evidence of mitral stenosis.  5. The aortic valve is tricuspid. There is mild calcification of the aortic valve. There is mild thickening of the aortic valve. Aortic valve regurgitation is not visualized. Aortic valve sclerosis is present, with no evidence of aortic valve stenosis.  6. The inferior vena cava is dilated in size with >50% respiratory variability, suggesting right atrial pressure of 8 mmHg. FINDINGS  Left Ventricle: Inferior septal and apical hypokinesis. Left ventricular ejection fraction, by estimation, is 45 to 50%. The left ventricle has mildly decreased function. The left ventricle demonstrates regional wall motion abnormalities. The left ventricular internal cavity size was moderately dilated. There is moderate left ventricular hypertrophy. Left ventricular diastolic parameters are indeterminate. Right Ventricle: The right ventricular size is normal. No increase in right ventricular  wall thickness. Right ventricular systolic function is normal. Left  Atrium: Left atrial size was mild to moderately dilated. Right Atrium: Right atrial size was normal in size. Pericardium: There is no evidence of pericardial effusion. Mitral Valve: The mitral valve is abnormal. There is mild thickening of the mitral valve leaflet(s). There is mild calcification of the mitral valve leaflet(s). Trivial mitral valve regurgitation. No evidence of mitral valve stenosis. Tricuspid Valve: The tricuspid valve is normal in structure. Tricuspid valve regurgitation is mild . No evidence of tricuspid stenosis. Aortic Valve: The aortic valve is tricuspid. There is mild calcification of the aortic valve. There is mild thickening of the aortic valve. Aortic valve regurgitation is not visualized. Aortic valve sclerosis is present, with no evidence of aortic valve stenosis. Aortic valve peak gradient measures 8.6 mmHg. Pulmonic Valve: The pulmonic valve was normal in structure. Pulmonic valve regurgitation is not visualized. No evidence of pulmonic stenosis. Aorta: The aortic root is normal in size and structure. Venous: The inferior vena cava is dilated in size with greater than 50% respiratory variability, suggesting right atrial pressure of 8 mmHg. IAS/Shunts: No atrial level shunt detected by color flow Doppler.  LEFT VENTRICLE PLAX 2D LVIDd:         5.50 cm      Diastology LVIDs:         4.60 cm      LV e' medial:    5.44 cm/s LV PW:         1.30 cm      LV E/e' medial:  13.2 LV IVS:        1.50 cm      LV e' lateral:   6.64 cm/s LVOT diam:     2.00 cm      LV E/e' lateral: 10.8 LV SV:         68 LV SV Index:   33 LVOT Area:     3.14 cm  LV Volumes (MOD) LV vol d, MOD A2C: 156.0 ml LV vol d, MOD A4C: 161.0 ml LV vol s, MOD A2C: 73.3 ml LV vol s, MOD A4C: 76.4 ml LV SV MOD A2C:     82.7 ml LV SV MOD A4C:     161.0 ml LV SV MOD BP:      85.3 ml RIGHT VENTRICLE             IVC RV Basal diam:  2.80 cm     IVC diam: 2.70 cm RV Mid diam:    2.70 cm RV S prime:     12.00 cm/s TAPSE (M-mode): 1.6 cm  LEFT ATRIUM             Index        RIGHT ATRIUM           Index LA diam:        4.20 cm 2.01 cm/m   RA Area:     14.40 cm LA Vol (A2C):   66.2 ml 31.76 ml/m  RA Volume:   33.20 ml  15.93 ml/m LA Vol (A4C):   91.0 ml 43.65 ml/m LA Biplane Vol: 79.5 ml 38.14 ml/m  AORTIC VALVE AV Area (Vmax): 2.02 cm AV Vmax:        147.00 cm/s AV Peak Grad:   8.6 mmHg LVOT Vmax:      94.60 cm/s LVOT Vmean:     72.400 cm/s LVOT VTI:       0.216 m  AORTA Ao Root diam: 3.10  cm Ao Asc diam:  3.40 cm MITRAL VALVE               TRICUSPID VALVE MV Area (PHT): 5.09 cm    TR Peak grad:   34.8 mmHg MV Decel Time: 149 msec    TR Vmax:        295.00 cm/s MV E velocity: 71.80 cm/s MV A velocity: 73.20 cm/s  SHUNTS MV E/A ratio:  0.98        Systemic VTI:  0.22 m                            Systemic Diam: 2.00 cm Jenkins Rouge MD Electronically signed by Jenkins Rouge MD Signature Date/Time: 05/04/2022/2:24:43 PM    Final      Patient Profile     76 y.o. male with new HF in the setting of frequent PVCs and sepsis with complicated UTI  Assessment & Plan    Heart Failure Mildly Reduced Ejection Fraction  UTI and sepsis Frequent PVCs HTN with DM HLD with DM - NYHA class II, Stage C, hypervolemic, etiology from PVCs suspected - Diuretic regimen: lasix 40 IV BID - Will start succinate 25 mg PO daily for PVC suppression and for HF; can be taken nightly at home - BP has done well with diuresis, adding low dose ARB (because he may get outpatient ARNI, switched from his home ACEi) - given his complex UTI's will not start SGLT2i this admission - MRA not planned for this admission unless BP room and K needed - outpatient ischemic work up after recovery from UTI     For questions or updates, please contact [facility] Please consult [email or web address] for contact info under Cardiology/STEMI.      Rudean Haskell, MD [address] [place], [place]  [address] [phone]  7:37 AM

## 2022-05-06 NOTE — TOC Progression Note (Signed)
Transition of Care [facility]) - Progression Note    Patient Details  [name] MRN: [id number] Date of Birth: 1946-01-24  [facility] CM/SW Contact  Ross Ludwig, [place] Phone Number: 05/06/2022, 1:23 PM  Clinical Narrative:     CSW updated [place] that per physician, patient is not medically ready for discharge yet.   Expected Discharge Plan: [place] Barriers to Discharge: Continued Medical Work up  Expected Discharge Plan and Services Expected Discharge Plan: [place] In-house Referral: Clinical Social Work Discharge Planning Services: CM Consult Post Acute Care Choice: [place] Living arrangements for the past 2 months: Single Family Home                 DME Arranged: N/A DME Agency: NA                   Social Determinants of Health (SDOH) Interventions    Readmission Risk Interventions    05/01/2022    2:36 PM  Readmission Risk Prevention Plan  Transportation Screening Complete  PCP or Specialist Appt within 5-7 Days Complete  Home Care Screening Complete  Medication Review (RN CM) Complete

## 2022-05-06 NOTE — Plan of Care (Signed)

## 2022-05-06 NOTE — Progress Notes (Signed)
PROGRESS NOTE    [name]  YNW:[id number] DOB: 08/17/1946 DOA: 04/29/2022 PCP: Josetta Huddle, MD  Brief Narrative:  [name] is a 76 y.o. 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  Recently hospitalized 7/7-7/9 for same treated with meropenem, has been weaker since then -Brought to the ED 7/16 evening with significant weakness, febrile to 101.4, UA abnormal again, chest x-ray was unremarkable, also  followed by ID for recurrent ESBL UTIs  Assessment & Plan:  Recurrent ESBL UTI -sepsis on admission ruled out -Recent admission for Klebsiella ESBL and few earlier this year -Cultures unchanged -seen by urology. CT pelvis without prostatic abscess. Will need to follow up with urology -ID following with recommendations to complete 5 days of meropenem. Will follow up with ID as outpatient  Dysphagia -worsening over the past week. 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% which is a decline from prior readings -cardiology consulted, appreciate assistance -continued on IV lasix -started on toprol, ARB -plans for outpatient ischemic work up   HTN (hypertension) Continue Losartan   Diabetes ([place]) -CBGs are stable continue sliding scale insulin   Parkinson disease ([place]) Worsening weakness likely due to current UTI -continue home Sinemet, entacapone and requip - PT/OT consult -SNF placement  DVT prophylaxis: lovenox Code Status: full Family Communication: Discussed with daughter and wife at bedside Dispo: To be determined, PT OT with recs for SNF  Consultants:  ID Urology Palliative care cardiology   Antimicrobials:  Meropenem, completed 5 days     Subjective: Denies any shortness of breath or chest pain  Objective: Vitals:   05/05/22 1247 05/05/22 1950 05/06/22 0621 05/06/22 1153  BP: 137/88 126/72 124/71 (!) 152/74  Pulse: 69 63 62 66  Resp: '15 18 18 16  '$ Temp: 98.3 F (36.8 C) 98 F (36.7 C) 97.9 F (36.6 C) 98.4 F (36.9 C)  TempSrc: Oral Oral Oral Oral  SpO2: 97% 99% 95% 97%  Weight:      Height:        Intake/Output Summary (Last 24 hours) at 05/06/2022 1401 Last data filed at 05/06/2022 1349 Gross per 24 hour  Intake 1930 ml  Output 3850 ml  Net -1920 ml   Filed Weights   04/29/22 1629 05/02/22 0936  Weight: 86.2 kg 86.2 kg    Examination:  General exam: Chronically ill male sitting up in bed, awake alert oriented to self and place, cognitive deficits noted CVS: S1-S2, regular rhythm Lungs: crackles at bases Abdomen: Soft, obese, nontender, bowel sounds present Extremities: 1+ edema bilaterally, more on left (chronic), seems to be improving Psych: Flat affect    Data Reviewed: I have personally reviewed following labs and imaging studies  CBC: Recent Labs  Lab 04/29/22 1640 04/30/22 0440 05/02/22 0539  WBC 9.8 10.1 6.5  NEUTROABS 8.1*  --   --   HGB 13.3 12.2* 12.4*  HCT 40.8 36.5* 37.6*  MCV 91.5 90.6 90.8  PLT 245 222 657   Basic Metabolic Panel: Recent Labs  Lab 05/02/22 0539 05/03/22 0530 05/04/22 0345 05/05/22 0319 05/06/22 0547  NA 130* 137 138 138 137  K 4.2 4.1 3.7 3.9 3.8  CL 97* 104 103 104 101  CO2 '28 26 28 29 29  '$ GLUCOSE 206*  250* 168* 173* 213*  BUN '20 17 17 '$ 24* 29*  CREATININE 1.12 0.95 0.91 0.84 1.00  CALCIUM 8.3* 8.4* 8.6* 8.7* 8.7*   GFR: Estimated Creatinine Clearance: 70.1 mL/min (by C-G formula based on SCr of 1 mg/dL). Liver Function Tests: Recent Labs  Lab 04/29/22 1640  AST 10*  ALT 5  ALKPHOS 89  BILITOT 0.6  PROT 6.8  ALBUMIN 3.3*   No results for input(s): "LIPASE", "AMYLASE" in the last 168 hours. No results for input(s): "AMMONIA" in  the last 168 hours. Coagulation Profile: Recent Labs  Lab 04/29/22 1750  INR 1.0   Cardiac Enzymes: No results for input(s): "CKTOTAL", "CKMB", "CKMBINDEX", "TROPONINI" in the last 168 hours. BNP (last 3 results) No results for input(s): "PROBNP" in the last 8760 hours. HbA1C: No results for input(s): "HGBA1C" in the last 72 hours. CBG: Recent Labs  Lab 05/05/22 1719 05/05/22 1952 05/05/22 2126 05/06/22 0734 05/06/22 1117  GLUCAP 234* 278* 200* 214* 256*   Lipid Profile: No results for input(s): "CHOL", "HDL", "LDLCALC", "TRIG", "CHOLHDL", "LDLDIRECT" in the last 72 hours. Thyroid Function Tests: No results for input(s): "TSH", "T4TOTAL", "FREET4", "T3FREE", "THYROIDAB" in the last 72 hours. Anemia Panel: No results for input(s): "VITAMINB12", "FOLATE", "FERRITIN", "TIBC", "IRON", "RETICCTPCT" in the last 72 hours. Urine analysis:    Component Value Date/Time   COLORURINE YELLOW 04/29/2022 1640   APPEARANCEUR HAZY (A) 04/29/2022 1640   LABSPEC 1.015 04/29/2022 1640   PHURINE 6.0 04/29/2022 1640   GLUCOSEU 150 (A) 04/29/2022 1640   HGBUR SMALL (A) 04/29/2022 1640   BILIRUBINUR NEGATIVE 04/29/2022 1640   KETONESUR 5 (A) 04/29/2022 1640   PROTEINUR 100 (A) 04/29/2022 1640   UROBILINOGEN 0.2 10/25/2009 1404   NITRITE POSITIVE (A) 04/29/2022 1640   LEUKOCYTESUR LARGE (A) 04/29/2022 1640   Sepsis Labs: '@LABRCNTIP'$ (procalcitonin:4,lacticidven:4)  ) Recent Results (from the past 240 hour(s))  Blood Culture (routine x 2)     Status: None   Collection Time: 04/29/22  4:37 PM   Specimen: BLOOD  Result Value Ref Range Status   Specimen Description   Final    BLOOD BLOOD LEFT HAND Performed at [facility], [place] [address] [place], [place] [address]    Special Requests   Final    BOTTLES DRAWN AEROBIC ONLY Blood Culture results may not be optimal due to an inadequate volume of blood received in culture bottles Performed at [facility] [address], [place], [place] [address]    Culture   Final    NO GROWTH 5 DAYS Performed at [facility], [place] [address] [place],  [address]    Report Status 05/04/2022 FINAL  Final  Resp Panel by RT-PCR (Flu A&B, Covid) Anterior Nasal Swab     Status: None   Collection Time: 04/29/22  4:40 PM   Specimen: Anterior Nasal Swab  Result Value Ref Range Status   SARS Coronavirus 2 by RT PCR NEGATIVE NEGATIVE Final    Comment: (NOTE) SARS-CoV-2 target nucleic acids are NOT DETECTED.  The SARS-CoV-2 RNA is generally detectable in upper respiratory specimens during the acute phase of infection. The lowest concentration of SARS-CoV-2 viral copies this assay can detect is 138 copies/mL. A negative result does not preclude SARS-Cov-2 infection and should not be used as the sole basis for treatment or other patient management decisions. A negative result may occur with  improper specimen collection/handling, submission of specimen other than nasopharyngeal swab, presence of viral mutation(s) within the  areas targeted by this assay, and inadequate number of viral copies(<138 copies/mL). A negative result must be combined with clinical observations, patient history, and epidemiological information. The expected result is Negative.  Fact Sheet for Patients:  EntrepreneurPulse.com.au  Fact Sheet for Healthcare Providers:  IncredibleEmployment.be  This test is no t yet approved or cleared by the [place] FDA and  has been authorized for detection and/or diagnosis of SARS-CoV-2 by FDA under an Emergency Use Authorization (EUA). This EUA will remain  in effect (meaning this test can be used) for the duration of the COVID-19 declaration under Section 564(b)(1) of the Act, 21 U.S.C.section 360bbb-3(b)(1), unless the authorization is terminated  or revoked sooner.       Influenza A by PCR NEGATIVE NEGATIVE Final   Influenza B  by PCR NEGATIVE NEGATIVE Final    Comment: (NOTE) The Xpert Xpress SARS-CoV-2/FLU/RSV plus assay is intended as an aid in the diagnosis of influenza from Nasopharyngeal swab specimens and should not be used as a sole basis for treatment. Nasal washings and aspirates are unacceptable for Xpert Xpress SARS-CoV-2/FLU/RSV testing.  Fact Sheet for Patients: EntrepreneurPulse.com.au  Fact Sheet for Healthcare Providers: IncredibleEmployment.be  This test is not yet approved or cleared by the [place] FDA and has been authorized for detection and/or diagnosis of SARS-CoV-2 by FDA under an Emergency Use Authorization (EUA). This EUA will remain in effect (meaning this test can be used) for the duration of the COVID-19 declaration under Section 564(b)(1) of the Act, 21 U.S.C. section 360bbb-3(b)(1), unless the authorization is terminated or revoked.  Performed at [facility], [place] [address] [place], [place] [address]   Urine Culture     Status: Abnormal   Collection Time: 04/29/22  4:40 PM   Specimen: In/Out Cath Urine  Result Value Ref Range Status   Specimen Description   Final    IN/OUT CATH URINE Performed at [facility] [address], [place], [place] [address]    Special Requests   Final    NONE Performed at [facility], [place] [address], [place], [place] [address]    Culture (A)  Final    >=100,000 COLONIES/mL KLEBSIELLA PNEUMONIAE Confirmed Extended Spectrum Beta-Lactamase Producer (ESBL).  In bloodstream infections from ESBL organisms, carbapenems are preferred over piperacillin/tazobactam. They are shown to have a lower risk of mortality.    Report Status 05/01/2022 FINAL  Final   Organism ID, Bacteria KLEBSIELLA PNEUMONIAE (A)  Final      Susceptibility   Klebsiella pneumoniae - MIC*    AMPICILLIN >=32 RESISTANT Resistant     CEFAZOLIN >=64 RESISTANT Resistant      CEFEPIME >=32 RESISTANT Resistant     CEFTRIAXONE >=64 RESISTANT Resistant     CIPROFLOXACIN >=4 RESISTANT Resistant     GENTAMICIN >=16 RESISTANT Resistant     IMIPENEM 0.5 SENSITIVE Sensitive     NITROFURANTOIN 256 RESISTANT Resistant     TRIMETH/SULFA >=320 RESISTANT Resistant     AMPICILLIN/SULBACTAM >=32 RESISTANT Resistant     PIP/TAZO >=128 RESISTANT Resistant     * >=100,000 COLONIES/mL KLEBSIELLA PNEUMONIAE  Blood Culture (routine x 2)     Status: None   Collection Time: 04/29/22  6:00 PM   Specimen: BLOOD  Result Value Ref Range Status   Specimen Description   Final    BLOOD LEFT ANTECUBITAL Performed at [facility] [address] [place],  [address]    Special Requests   Final  BOTTLES DRAWN AEROBIC AND ANAEROBIC Blood Culture adequate volume Performed at [facility] [address], [place], [place] [address]    Culture   Final    NO GROWTH 5 DAYS Performed at [facility], [place] [address], [place], [place] [address]    Report Status 05/05/2022 FINAL  Final         Radiology Studies: ECHOCARDIOGRAM COMPLETE  Result Date: 05/04/2022    ECHOCARDIOGRAM REPORT   Patient Name:   [name] Date of Exam: 05/04/2022 Medical Rec #:  [id number]   Height:       72.0 in Accession #:    [id number]  Weight:       190.0 lb Date of Birth:  [birth year]   BSA:          2.085 m Patient Age:    61 years    BP:           130/75 mmHg Patient Gender: M           HR:           70 bpm. Exam Location:  Inpatient Procedure: 2D Echo, Cardiac Doppler and Color Doppler Indications:    CHF  History:        Patient has prior history of Echocardiogram examinations. Risk                 Factors:Hypertension and Diabetes.  Sonographer:    Jyl Heinz Referring Phys: [place]  1. Inferior septal and apical hypokinesis . Left ventricular ejection fraction, by estimation, is 45 to 50%. The left ventricle has mildly decreased  function. The left ventricle demonstrates regional wall motion abnormalities (see scoring diagram/findings for description). The left ventricular internal cavity size was moderately dilated. There is moderate left ventricular hypertrophy. Left ventricular diastolic parameters are indeterminate.  2. Right ventricular systolic function is normal. The right ventricular size is normal.  3. Left atrial size was mild to moderately dilated.  4. The mitral valve is abnormal. Trivial mitral valve regurgitation. No evidence of mitral stenosis.  5. The aortic valve is tricuspid. There is mild calcification of the aortic valve. There is mild thickening of the aortic valve. Aortic valve regurgitation is not visualized. Aortic valve sclerosis is present, with no evidence of aortic valve stenosis.  6. The inferior vena cava is dilated in size with >50% respiratory variability, suggesting right atrial pressure of 8 mmHg. FINDINGS  Left Ventricle: Inferior septal and apical hypokinesis. Left ventricular ejection fraction, by estimation, is 45 to 50%. The left ventricle has mildly decreased function. The left ventricle demonstrates regional wall motion abnormalities. The left ventricular internal cavity size was moderately dilated. There is moderate left ventricular hypertrophy. Left ventricular diastolic parameters are indeterminate. Right Ventricle: The right ventricular size is normal. No increase in right ventricular wall thickness. Right ventricular systolic function is normal. Left Atrium: Left atrial size was mild to moderately dilated. Right Atrium: Right atrial size was normal in size. Pericardium: There is no evidence of pericardial effusion. Mitral Valve: The mitral valve is abnormal. There is mild thickening of the mitral valve leaflet(s). There is mild calcification of the mitral valve leaflet(s). Trivial mitral valve regurgitation. No evidence of mitral valve stenosis. Tricuspid Valve: The tricuspid valve is normal in  structure. Tricuspid valve regurgitation is mild . No evidence of tricuspid stenosis. Aortic Valve: The aortic valve is tricuspid. There is mild calcification of the aortic valve. There is mild thickening of the  aortic valve. Aortic valve regurgitation is not visualized. Aortic valve sclerosis is present, with no evidence of aortic valve stenosis. Aortic valve peak gradient measures 8.6 mmHg. Pulmonic Valve: The pulmonic valve was normal in structure. Pulmonic valve regurgitation is not visualized. No evidence of pulmonic stenosis. Aorta: The aortic root is normal in size and structure. Venous: The inferior vena cava is dilated in size with greater than 50% respiratory variability, suggesting right atrial pressure of 8 mmHg. IAS/Shunts: No atrial level shunt detected by color flow Doppler.  LEFT VENTRICLE PLAX 2D LVIDd:         5.50 cm      Diastology LVIDs:         4.60 cm      LV e' medial:    5.44 cm/s LV PW:         1.30 cm      LV E/e' medial:  13.2 LV IVS:        1.50 cm      LV e' lateral:   6.64 cm/s LVOT diam:     2.00 cm      LV E/e' lateral: 10.8 LV SV:         68 LV SV Index:   33 LVOT Area:     3.14 cm  LV Volumes (MOD) LV vol d, MOD A2C: 156.0 ml LV vol d, MOD A4C: 161.0 ml LV vol s, MOD A2C: 73.3 ml LV vol s, MOD A4C: 76.4 ml LV SV MOD A2C:     82.7 ml LV SV MOD A4C:     161.0 ml LV SV MOD BP:      85.3 ml RIGHT VENTRICLE             IVC RV Basal diam:  2.80 cm     IVC diam: 2.70 cm RV Mid diam:    2.70 cm RV S prime:     12.00 cm/s TAPSE (M-mode): 1.6 cm LEFT ATRIUM             Index        RIGHT ATRIUM           Index LA diam:        4.20 cm 2.01 cm/m   RA Area:     14.40 cm LA Vol (A2C):   66.2 ml 31.76 ml/m  RA Volume:   33.20 ml  15.93 ml/m LA Vol (A4C):   91.0 ml 43.65 ml/m LA Biplane Vol: 79.5 ml 38.14 ml/m  AORTIC VALVE AV Area (Vmax): 2.02 cm AV Vmax:        147.00 cm/s AV Peak Grad:   8.6 mmHg LVOT Vmax:      94.60 cm/s LVOT Vmean:     72.400 cm/s LVOT VTI:       0.216 m  AORTA Ao  Root diam: 3.10 cm Ao Asc diam:  3.40 cm MITRAL VALVE               TRICUSPID VALVE MV Area (PHT): 5.09 cm    TR Peak grad:   34.8 mmHg MV Decel Time: 149 msec    TR Vmax:        295.00 cm/s MV E velocity: 71.80 cm/s MV A velocity: 73.20 cm/s  SHUNTS MV E/A ratio:  0.98        Systemic VTI:  0.22 m  Systemic Diam: 2.00 cm Jenkins Rouge MD Electronically signed by Jenkins Rouge MD Signature Date/Time: 05/04/2022/2:24:43 PM    Final         Scheduled Meds:  carbidopa-levodopa  3 tablet Oral 5 times per day   Chlorhexidine Gluconate Cloth  6 each Topical Daily   cholecalciferol  2,000 Units Oral Daily   docusate sodium  100 mg Oral BID   enoxaparin (LOVENOX) injection  40 mg Subcutaneous Q24H   entacapone  200 mg Oral 3 times per day   furosemide  40 mg Intravenous BID   insulin aspart  0-9 Units Subcutaneous TID PC & HS   insulin aspart  3 Units Subcutaneous TID WC   insulin glargine-yfgn  10 Units Subcutaneous Daily   losartan  12.5 mg Oral Daily   methenamine  1,000 mg Oral BID   metoprolol succinate  25 mg Oral Daily   pantoprazole  40 mg Oral Daily   potassium chloride SA  20 mEq Oral BID   rOPINIRole  1.5 mg Oral QHS   rOPINIRole  3 mg Oral BID AC   sodium chloride flush  10-40 mL Intracatheter Q12H   tamsulosin  0.4 mg Oral BID   vitamin B-12  1,000 mcg Oral Daily   vitamin C  250 mg Oral BID   Continuous Infusions:     LOS: 7 days     Kathie Dike, MD [facility]   05/06/2022, 2:01 PM

## 2022-05-07 DIAGNOSIS — I10 Essential (primary) hypertension: Secondary | ICD-10-CM | POA: Diagnosis not present

## 2022-05-07 DIAGNOSIS — I5041 Acute combined systolic (congestive) and diastolic (congestive) heart failure: Secondary | ICD-10-CM | POA: Diagnosis not present

## 2022-05-07 DIAGNOSIS — G20 Parkinson's disease: Secondary | ICD-10-CM | POA: Diagnosis not present

## 2022-05-07 DIAGNOSIS — N390 Urinary tract infection, site not specified: Secondary | ICD-10-CM | POA: Diagnosis not present

## 2022-05-07 DIAGNOSIS — N400 Enlarged prostate without lower urinary tract symptoms: Secondary | ICD-10-CM | POA: Diagnosis not present

## 2022-05-07 DIAGNOSIS — B9629 Other Escherichia coli [E. coli] as the cause of diseases classified elsewhere: Secondary | ICD-10-CM | POA: Diagnosis not present

## 2022-05-07 DIAGNOSIS — I5042 Chronic combined systolic (congestive) and diastolic (congestive) heart failure: Secondary | ICD-10-CM

## 2022-05-07 LAB — BASIC METABOLIC PANEL
Anion gap: 7 (ref 5–15)
BUN: 29 mg/dL — ABNORMAL HIGH (ref 8–23)
CO2: 30 mmol/L (ref 22–32)
Calcium: 9.2 mg/dL (ref 8.9–10.3)
Chloride: 101 mmol/L (ref 98–111)
Creatinine, Ser: 1.01 mg/dL (ref 0.61–1.24)
GFR, Estimated: >60 mL/min (ref >60)
Glucose, Bld: 249 mg/dL — ABNORMAL HIGH (ref 70–99)
Potassium: 4.1 mmol/L (ref 3.5–5.1)
Sodium: 138 mmol/L (ref 135–145)

## 2022-05-07 LAB — GLUCOSE, CAPILLARY
Glucose-Capillary: 272 mg/dL — ABNORMAL HIGH (ref 70–99)
Glucose-Capillary: 363 mg/dL — ABNORMAL HIGH (ref 70–99)
Glucose-Capillary: 100 mg/dL — ABNORMAL HIGH (ref 70–99)
Glucose-Capillary: 130 mg/dL — ABNORMAL HIGH (ref 70–99)

## 2022-05-07 LAB — BRAIN NATRIURETIC PEPTIDE: B Natriuretic Peptide: 266.5 pg/mL — ABNORMAL HIGH (ref 0.0–100.0)

## 2022-05-07 MED ORDER — QUETIAPINE FUMARATE 25 MG PO TABS
25.0000 mg | ORAL_TABLET | Freq: Every day | ORAL | Status: DC
  Administered 2022-05-07: 25 mg via ORAL
  Filled 2022-05-07: qty 1

## 2022-05-07 MED ORDER — FUROSEMIDE 20 MG PO TABS
30.0000 mg | ORAL_TABLET | Freq: Two times a day (BID) | ORAL | Status: DC
  Administered 2022-05-07 – 2022-05-08 (×2): 30 mg via ORAL
  Filled 2022-05-07 (×2): qty 2

## 2022-05-07 NOTE — Progress Notes (Signed)
PROGRESS NOTE    BENJERMAN Le  ELF:[id number] DOB: July 15, 1946 DOA: 04/29/2022 PCP: Josetta Huddle, MD  Brief Narrative:  [name] is a 76 y.o. 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  Recently hospitalized 7/7-7/9 for same treated with meropenem, has been weaker since then -Brought to the ED 7/16 evening with significant weakness, febrile to 101.4, UA abnormal again, chest x-ray was unremarkable, also  followed by ID for recurrent ESBL UTIs  Assessment & Plan:  Recurrent ESBL UTI -sepsis on admission ruled out -Recent admission for Klebsiella ESBL and few earlier this year -Cultures unchanged -seen by urology. CT pelvis without prostatic abscess. Will need to follow up with urology -ID following with recommendations to complete 5 days of meropenem. Will follow up with ID as outpatient  Dysphagia -worsening over the past week. 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 today -Monitor intake and output -started on toprol, ARB -plans for outpatient ischemic work up   HTN (hypertension) Continue Losartan   Diabetes ([place]) -CBGs are stable continue sliding scale insulin   Parkinson disease ([place]) Worsening weakness likely due to current UTI -continue home Sinemet, entacapone and requip - PT/OT consult -SNF placement -Did have some disorientation this morning, possibly related to poor sleep -We will try some low-dose Seroquel at night  DVT prophylaxis: lovenox Code Status: full Family Communication: Discussed with daughter and wife at bedside Dispo: To be  determined, PT OT with recs for SNF  Consultants:  ID Urology Palliative care cardiology   Antimicrobials:  Meropenem, completed 5 days    Subjective: Denies any shortness of breath.  He is frustrated by having to be in the hospital.  Wants to get on with therapy.  Says that he did not sleep well last night.  Family had noted some confusion earlier today.  Objective: Vitals:   05/06/22 2025 05/07/22 0501 05/07/22 0928 05/07/22 1255  BP: (!) 107/91 (!) 142/81 (!) 130/57 137/70  Pulse: 63 66 62 64  Resp: '18 18  18  '$ Temp: 97.6 F (36.4 C) 98.8 F (37.1 C)  98.6 F (37 C)  TempSrc: Oral Oral  Oral  SpO2: 93% 98%  98%  Weight:      Height:        Intake/Output Summary (Last 24 hours) at 05/07/2022 1858 Last data filed at 05/07/2022 1605 Gross per 24 hour  Intake 730 ml  Output 3000 ml  Net -2270 ml   Filed Weights   04/29/22 1629 05/02/22 0936  Weight: 86.2 kg 86.2 kg    Examination:  General exam: Alert, awake, oriented x 3 Respiratory system: Clear to auscultation. Respiratory effort normal. Cardiovascular system:RRR. No murmurs, rubs, gallops. Gastrointestinal system: Abdomen is nondistended, soft and nontender. No organomegaly or masses felt. Normal bowel sounds heard. Central nervous system: Alert and oriented. No focal neurological deficits. Extremities: Lower extremity edema improving Skin: No rashes, lesions or ulcers Psychiatry: Judgement and insight appear normal. Mood & affect appropriate.      Data Reviewed: I have personally reviewed following labs and imaging studies  CBC: Recent Labs  Lab 05/02/22 0539  WBC 6.5  HGB 12.4*  HCT 37.6*  MCV 90.8  PLT 101   Basic Metabolic Panel: Recent Labs  Lab 05/03/22 0530 05/04/22 0345 05/05/22 0319 05/06/22 0547 05/07/22 0514  NA 137 138 138 137 138  K 4.1 3.7 3.9 3.8 4.1  CL 104 103 104 101 101  CO2 '26 28 29 29 30  '$ GLUCOSE 250* 168* 173* 213* 249*  BUN 17 17 24* 29* 29*  CREATININE 0.95  0.91 0.84 1.00 1.01  CALCIUM 8.4* 8.6* 8.7* 8.7* 9.2   GFR: Estimated Creatinine Clearance: 69.4 mL/min (by C-G formula based on SCr of 1.01 mg/dL). Liver Function Tests: No results for input(s): "AST", "ALT", "ALKPHOS", "BILITOT", "PROT", "ALBUMIN" in the last 168 hours.  No results for input(s): "LIPASE", "AMYLASE" in the last 168 hours. No results for input(s): "AMMONIA" in the last 168 hours. Coagulation Profile: No results for input(s): "INR", "PROTIME" in the last 168 hours.  Cardiac Enzymes: No results for input(s): "CKTOTAL", "CKMB", "CKMBINDEX", "TROPONINI" in the last 168 hours. BNP (last 3 results) No results for input(s): "PROBNP" in the last 8760 hours. HbA1C: No results for input(s): "HGBA1C" in the last 72 hours. CBG: Recent Labs  Lab 05/06/22 0734 05/06/22 1117 05/06/22 1623 05/06/22 2026 05/07/22 0732  GLUCAP 214* 256* 315* 218* 272*   Lipid Profile: No results for input(s): "CHOL", "HDL", "LDLCALC", "TRIG", "CHOLHDL", "LDLDIRECT" in the last 72 hours. Thyroid Function Tests: No results for input(s): "TSH", "T4TOTAL", "FREET4", "T3FREE", "THYROIDAB" in the last 72 hours. Anemia Panel: No results for input(s): "VITAMINB12", "FOLATE", "FERRITIN", "TIBC", "IRON", "RETICCTPCT" in the last 72 hours. Urine analysis:    Component Value Date/Time   COLORURINE YELLOW 04/29/2022 1640   APPEARANCEUR HAZY (A) 04/29/2022 1640   LABSPEC 1.015 04/29/2022 1640   PHURINE 6.0 04/29/2022 1640   GLUCOSEU 150 (A) 04/29/2022 1640   HGBUR SMALL (A) 04/29/2022 1640   BILIRUBINUR NEGATIVE 04/29/2022 1640   KETONESUR 5 (A) 04/29/2022 1640   PROTEINUR 100 (A) 04/29/2022 1640   UROBILINOGEN 0.2 10/25/2009 1404   NITRITE POSITIVE (A) 04/29/2022 1640   LEUKOCYTESUR LARGE (A) 04/29/2022 1640   Sepsis Labs: '@LABRCNTIP'$ (procalcitonin:4,lacticidven:4)  ) Recent Results (from the past 240 hour(s))  Blood Culture (routine x 2)     Status: None   Collection Time: 04/29/22  4:37 PM    Specimen: BLOOD  Result Value Ref Range Status   Specimen Description   Final    BLOOD BLOOD LEFT HAND Performed at [facility], [place] [address], [place], [place] [address]    Special Requests   Final    BOTTLES DRAWN AEROBIC ONLY Blood Culture results may not be optimal due to an inadequate volume of blood received in culture bottles Performed at [facility] [address], [place], [place] [address]    Culture   Final    NO GROWTH 5 DAYS Performed at [facility], [place] [address], [place],  [address]    Report Status 05/04/2022 FINAL  Final  Resp Panel by RT-PCR (Flu A&B, Covid) Anterior Nasal Swab     Status: None   Collection Time: 04/29/22  4:40 PM   Specimen: Anterior Nasal Swab  Result Value Ref Range Status   SARS Coronavirus 2 by RT PCR NEGATIVE NEGATIVE Final    Comment: (NOTE) SARS-CoV-2 target nucleic acids are NOT DETECTED.  The SARS-CoV-2 RNA is generally detectable in upper respiratory specimens during the acute phase of infection. The lowest concentration of SARS-CoV-2 viral copies this assay can detect is 138 copies/mL. A  negative result does not preclude SARS-Cov-2 infection and should not be used as the sole basis for treatment or other patient management decisions. A negative result may occur with  improper specimen collection/handling, submission of specimen other than nasopharyngeal swab, presence of viral mutation(s) within the areas targeted by this assay, and inadequate number of viral copies(<138 copies/mL). A negative result must be combined with clinical observations, patient history, and epidemiological information. The expected result is Negative.  Fact Sheet for Patients:  EntrepreneurPulse.com.au  Fact Sheet for Healthcare Providers:  IncredibleEmployment.be  This test is no t yet approved or cleared by the [place] FDA and  has been authorized for  detection and/or diagnosis of SARS-CoV-2 by FDA under an Emergency Use Authorization (EUA). This EUA will remain  in effect (meaning this test can be used) for the duration of the COVID-19 declaration under Section 564(b)(1) of the Act, 21 U.S.C.section 360bbb-3(b)(1), unless the authorization is terminated  or revoked sooner.       Influenza A by PCR NEGATIVE NEGATIVE Final   Influenza B by PCR NEGATIVE NEGATIVE Final    Comment: (NOTE) The Xpert Xpress SARS-CoV-2/FLU/RSV plus assay is intended as an aid in the diagnosis of influenza from Nasopharyngeal swab specimens and should not be used as a sole basis for treatment. Nasal washings and aspirates are unacceptable for Xpert Xpress SARS-CoV-2/FLU/RSV testing.  Fact Sheet for Patients: EntrepreneurPulse.com.au  Fact Sheet for Healthcare Providers: IncredibleEmployment.be  This test is not yet approved or cleared by the [place] FDA and has been authorized for detection and/or diagnosis of SARS-CoV-2 by FDA under an Emergency Use Authorization (EUA). This EUA will remain in effect (meaning this test can be used) for the duration of the COVID-19 declaration under Section 564(b)(1) of the Act, 21 U.S.C. section 360bbb-3(b)(1), unless the authorization is terminated or revoked.  Performed at [facility], [place] [address] [place], [place] [address]   Urine Culture     Status: Abnormal   Collection Time: 04/29/22  4:40 PM   Specimen: In/Out Cath Urine  Result Value Ref Range Status   Specimen Description   Final    IN/OUT CATH URINE Performed at [facility] [address], [place], [place] [address]    Special Requests   Final    NONE Performed at [facility], [place] [address] [place], [place] [address]    Culture (A)  Final    >=100,000 COLONIES/mL KLEBSIELLA PNEUMONIAE Confirmed Extended Spectrum Beta-Lactamase Producer  (ESBL).  In bloodstream infections from ESBL organisms, carbapenems are preferred over piperacillin/tazobactam. They are shown to have a lower risk of mortality.    Report Status 05/01/2022 FINAL  Final   Organism ID, Bacteria KLEBSIELLA PNEUMONIAE (A)  Final      Susceptibility   Klebsiella pneumoniae - MIC*    AMPICILLIN >=32 RESISTANT Resistant     CEFAZOLIN >=64 RESISTANT Resistant     CEFEPIME >=32 RESISTANT Resistant     CEFTRIAXONE >=64 RESISTANT Resistant     CIPROFLOXACIN >=4 RESISTANT Resistant     GENTAMICIN >=16 RESISTANT Resistant     IMIPENEM 0.5 SENSITIVE Sensitive     NITROFURANTOIN 256 RESISTANT Resistant     TRIMETH/SULFA >=320 RESISTANT Resistant     AMPICILLIN/SULBACTAM >=32 RESISTANT Resistant     PIP/TAZO >=128 RESISTANT Resistant     * >=100,000 COLONIES/mL KLEBSIELLA PNEUMONIAE  Blood Culture (routine x 2)     Status: None   Collection Time: 04/29/22  6:00 PM  Specimen: BLOOD  Result Value Ref Range Status   Specimen Description   Final    BLOOD LEFT ANTECUBITAL Performed at [facility] [address], [place], [place] [address]    Special Requests   Final    BOTTLES DRAWN AEROBIC AND ANAEROBIC Blood Culture adequate volume Performed at [facility] [address] [place], [place] [address]    Culture   Final    NO GROWTH 5 DAYS Performed at [facility], [place] [address], [place],  [address]    Report Status 05/05/2022 FINAL  Final         Radiology Studies: No results found.      Scheduled Meds:  carbidopa-levodopa  3 tablet Oral 5 times per day   Chlorhexidine Gluconate Cloth  6 each Topical Daily   cholecalciferol  2,000 Units Oral Daily   docusate sodium  100 mg Oral BID   enoxaparin (LOVENOX) injection  40 mg Subcutaneous Q24H   entacapone  200 mg Oral 3 times per day   furosemide  30 mg Oral BID   insulin aspart  0-9 Units Subcutaneous TID PC & HS   insulin aspart  3 Units  Subcutaneous TID WC   insulin glargine-yfgn  10 Units Subcutaneous Daily   losartan  12.5 mg Oral Daily   methenamine  1,000 mg Oral BID   metoprolol succinate  25 mg Oral Daily   pantoprazole  40 mg Oral Daily   potassium chloride SA  20 mEq Oral BID   QUEtiapine  25 mg Oral QHS   rOPINIRole  1.5 mg Oral QHS   rOPINIRole  3 mg Oral BID AC   sodium chloride flush  10-40 mL Intracatheter Q12H   tamsulosin  0.4 mg Oral BID   vitamin B-12  1,000 mcg Oral Daily   vitamin C  250 mg Oral BID   Continuous Infusions:     LOS: 8 days     Kathie Dike, MD [facility]   05/07/2022, 6:58 PM

## 2022-05-07 NOTE — Progress Notes (Signed)
Progress Note  Patient [name] Date of Encounter: 05/07/2022  [facility] Cardiologist: Candee Furbish, MD   Subjective   Concerned that he doesn't understand what is going on.  Doesn't want to take too much lasix due to concern for falling 2/2 Parkinson's.  Doesn't think he is getting adequate PT.   Inpatient Medications    Scheduled Meds:  carbidopa-levodopa  3 tablet Oral 5 times per day   Chlorhexidine Gluconate Cloth  6 each Topical Daily   cholecalciferol  2,000 Units Oral Daily   docusate sodium  100 mg Oral BID   enoxaparin (LOVENOX) injection  40 mg Subcutaneous Q24H   entacapone  200 mg Oral 3 times per day   furosemide  40 mg Intravenous BID   insulin aspart  0-9 Units Subcutaneous TID PC & HS   insulin aspart  3 Units Subcutaneous TID WC   insulin glargine-yfgn  10 Units Subcutaneous Daily   losartan  12.5 mg Oral Daily   methenamine  1,000 mg Oral BID   metoprolol succinate  25 mg Oral Daily   pantoprazole  40 mg Oral Daily   potassium chloride SA  20 mEq Oral BID   rOPINIRole  1.5 mg Oral QHS   rOPINIRole  3 mg Oral BID AC   sodium chloride flush  10-40 mL Intracatheter Q12H   tamsulosin  0.4 mg Oral BID   vitamin B-12  1,000 mcg Oral Daily   vitamin C  250 mg Oral BID   Continuous Infusions:  PRN Meds: acetaminophen, diphenhydrAMINE, guaiFENesin-dextromethorphan, mouth rinse, sodium chloride flush, traMADol   Vital Signs    Vitals:   05/06/22 2025 05/07/22 0501 05/07/22 0928 05/07/22 1255  BP: (!) 107/91 (!) 142/81 (!) 130/57 137/70  Pulse: 63 66 62 64  Resp: '18 18  18  '$ Temp: 97.6 F (36.4 C) 98.8 F (37.1 C)  98.6 F (37 C)  TempSrc: Oral Oral  Oral  SpO2: 93% 98%  98%  Weight:      Height:        Intake/Output Summary (Last 24 hours) at 05/07/2022 1404 Last data filed at 05/07/2022 1300 Gross per 24 hour  Intake 250 ml  Output 3000 ml  Net -2750 ml      05/02/2022    9:36 AM 04/29/2022    4:29 PM 04/25/2022    3:34 PM   Last 3 Weights  Weight (lbs) 190 lb 190 lb 189 lb  Weight (kg) 86.183 kg 86.183 kg 85.73 kg      Telemetry    No telemetry - Personally Reviewed  ECG    N/a - Personally Reviewed  Physical Exam   VS:  BP 137/70   Pulse 64   Temp 98.6 F (37 C) (Oral)   Resp 18   Ht 6' (1.829 m)   Wt 86.2 kg   SpO2 98%   BMI 25.77 kg/m  , BMI Body mass index is 25.77 kg/m. GENERAL:  Well appearing HEENT: Pupils equal round and reactive, fundi not visualized, oral mucosa unremarkable NECK:  No jugular venous distention, waveform within normal limits, carotid upstroke brisk and symmetric, no bruits, no thyromegaly LUNGS:  Clear to auscultation bilaterally HEART:  RRR.  PMI not displaced or sustained,S1 and S2 within normal limits, no S3, no S4, no clicks, no rubs, no murmurs ABD:  Flat, positive bowel sounds normal in frequency in pitch, no bruits, no rebound, no guarding, no midline pulsatile mass, no hepatomegaly, no splenomegaly EXT:  2 plus pulses throughout, no edema, no cyanosis no clubbing SKIN:  No rashes no nodules NEURO:  Cranial nerves II through XII grossly intact, motor grossly intact throughout PSYCH:  Oreinted to person and place. Confused about medial team providers.    Labs    High Sensitivity Troponin:  No results for input(s): "TROPONINIHS" in the last 720 hours.   Chemistry Recent Labs  Lab 05/05/22 0319 05/06/22 0547 05/07/22 0514  NA 138 137 138  K 3.9 3.8 4.1  CL 104 101 101  CO2 '29 29 30  '$ GLUCOSE 173* 213* 249*  BUN 24* 29* 29*  CREATININE 0.84 1.00 1.01  CALCIUM 8.7* 8.7* 9.2  GFRNONAA >60 >60 >60  ANIONGAP '5 7 7    '$ Lipids No results for input(s): "CHOL", "TRIG", "HDL", "LABVLDL", "LDLCALC", "CHOLHDL" in the last 168 hours.  Hematology Recent Labs  Lab 05/02/22 0539  WBC 6.5  RBC 4.14*  HGB 12.4*  HCT 37.6*  MCV 90.8  MCH 30.0  MCHC 33.0  RDW 14.1  PLT 232   Thyroid No results for input(s): "TSH", "FREET4" in the last 168 hours.   BNP Recent Labs  Lab 05/05/22 0319 05/06/22 0547 05/07/22 0514  BNP 435.5* 369.1* 266.5*    DDimer No results for input(s): "DDIMER" in the last 168 hours.   Radiology    No results found.  Cardiac Studies   Echo 05/04/22: 1. Inferior septal and apical hypokinesis . Left ventricular ejection  fraction, by estimation, is 45 to 50%. The left ventricle has mildly  decreased function. The left ventricle demonstrates regional wall motion  abnormalities (see scoring  diagram/findings for description). The left ventricular internal cavity  size was moderately dilated. There is moderate left ventricular  hypertrophy. Left ventricular diastolic parameters are indeterminate.   2. Right ventricular systolic function is normal. The right ventricular  size is normal.   3. Left atrial size was mild to moderately dilated.   4. The mitral valve is abnormal. Trivial mitral valve regurgitation. No  evidence of mitral stenosis.   5. The aortic valve is tricuspid. There is mild calcification of the  aortic valve. There is mild thickening of the aortic valve. Aortic valve  regurgitation is not visualized. Aortic valve sclerosis is present, with  no evidence of aortic valve stenosis.   6. The inferior vena cava is dilated in size with >50% respiratory  variability, suggesting right atrial pressure of 8 mmHg.   Patient Profile     76 y.o. male with Parkinson's disease and chronic LE edema admitted with acute systolic and diastolic heart failure in the setting of sepsis from a urinary source from ESBL.  Assessment & Plan    # Acute systolic diastolic heart failure: Patient is struggled with lower EXTR edema chronically.  He now has new onset heart failure with LVEF 45 to 50%.  No clear ischemic symptoms.  He reports struggling with high doses of Lasix uses as an outpatient due to his Parkinson's and difficulty with ambulation.  He is not willing to try more than 25 mg of Lasix.  At home he was  recommended to be on 60 mg twice daily but was only taking 20 mg.  I think he is at a point where he can transition to oral Lasix.  We will give 30 mg this evening and measure his ins and outs.  Planning for outpatient ischemic evaluation.  Continue metoprolol and losartan.   Updated wife and daughter.  Extensive conversation with patient.  He is not willing to stay in the hospital past tomorrow.  I am not sure if he truly has capacity to make that decision.  Time spent: 70 minutes-Greater than 50% of this time was spent in counseling, explanation of diagnosis, planning of further management, and coordination of care.     For questions or updates, please contact [place] Please consult [email or web address] for contact info under        Signed, Skeet Latch, MD  05/07/2022, 2:04 PM

## 2022-05-07 NOTE — TOC Progression Note (Signed)
Transition of Care [name]) - Progression Note    Patient Details  [name] MRN: [id number] Date of Birth: 1946-09-28  Transition [facility]) CM/SW [place], [place] Phone Number: 05/07/2022, 3:23 PM  Clinical Narrative:    Requested continuance for insurance authorization for SNF placement.    Expected Discharge Plan: [place] Barriers to Discharge: Continued Medical Work up  Expected Discharge Plan and Services Expected Discharge Plan: [place] In-house Referral: Clinical Social Work Discharge Planning Services: CM Consult Post Acute Care Choice: [place] Living arrangements for the past 2 months: Single Family Home                 DME Arranged: N/A DME Agency: NA                   Social Determinants of Health (SDOH) Interventions    Readmission Risk Interventions    05/01/2022    2:36 PM  Readmission Risk Prevention Plan  Transportation Screening Complete  PCP or Specialist Appt within 5-7 Days Complete  Home Care Screening Complete  Medication Review (RN CM) Complete

## 2022-05-07 NOTE — Progress Notes (Signed)
PT Cancellation Note  Patient Details [name] MRN: [id number] DOB: 04-Jan-1946   Cancelled Treatment:     pt declined any OOB activity despite Max encouragement ands assist from RN.  No reason other than "waiting for the Cardiologist to discuss the plan".  Also had RN come into room to persuade pt but still pt declined.  "I was suppose to leave two days ago", stated pt. Pt has been evaluated with rec for ST Rehab at SNF.  Rica Koyanagi  PTA Acute  Rehabilitation Services Office M-F          [phone] Weekend pager [phone]

## 2022-05-08 DIAGNOSIS — E1169 Type 2 diabetes mellitus with other specified complication: Secondary | ICD-10-CM | POA: Diagnosis not present

## 2022-05-08 DIAGNOSIS — Z79899 Other long term (current) drug therapy: Secondary | ICD-10-CM | POA: Diagnosis not present

## 2022-05-08 DIAGNOSIS — F0392 Unspecified dementia, unspecified severity, with psychotic disturbance: Secondary | ICD-10-CM | POA: Diagnosis not present

## 2022-05-08 DIAGNOSIS — I10 Essential (primary) hypertension: Secondary | ICD-10-CM | POA: Diagnosis not present

## 2022-05-08 DIAGNOSIS — R52 Pain, unspecified: Secondary | ICD-10-CM | POA: Diagnosis not present

## 2022-05-08 DIAGNOSIS — E119 Type 2 diabetes mellitus without complications: Secondary | ICD-10-CM | POA: Diagnosis not present

## 2022-05-08 DIAGNOSIS — R296 Repeated falls: Secondary | ICD-10-CM | POA: Diagnosis not present

## 2022-05-08 DIAGNOSIS — J309 Allergic rhinitis, unspecified: Secondary | ICD-10-CM | POA: Diagnosis not present

## 2022-05-08 DIAGNOSIS — Z1612 Extended spectrum beta lactamase (ESBL) resistance: Secondary | ICD-10-CM | POA: Diagnosis not present

## 2022-05-08 DIAGNOSIS — E559 Vitamin D deficiency, unspecified: Secondary | ICD-10-CM | POA: Diagnosis not present

## 2022-05-08 DIAGNOSIS — I5041 Acute combined systolic (congestive) and diastolic (congestive) heart failure: Secondary | ICD-10-CM | POA: Diagnosis not present

## 2022-05-08 DIAGNOSIS — N390 Urinary tract infection, site not specified: Secondary | ICD-10-CM | POA: Diagnosis not present

## 2022-05-08 DIAGNOSIS — E1165 Type 2 diabetes mellitus with hyperglycemia: Secondary | ICD-10-CM | POA: Diagnosis not present

## 2022-05-08 DIAGNOSIS — G20 Parkinson's disease: Secondary | ICD-10-CM | POA: Diagnosis not present

## 2022-05-08 DIAGNOSIS — B9629 Other Escherichia coli [E. coli] as the cause of diseases classified elsewhere: Secondary | ICD-10-CM | POA: Diagnosis not present

## 2022-05-08 DIAGNOSIS — K219 Gastro-esophageal reflux disease without esophagitis: Secondary | ICD-10-CM | POA: Diagnosis not present

## 2022-05-08 DIAGNOSIS — N400 Enlarged prostate without lower urinary tract symptoms: Secondary | ICD-10-CM | POA: Diagnosis not present

## 2022-05-08 DIAGNOSIS — R269 Unspecified abnormalities of gait and mobility: Secondary | ICD-10-CM | POA: Diagnosis not present

## 2022-05-08 DIAGNOSIS — Z794 Long term (current) use of insulin: Secondary | ICD-10-CM | POA: Diagnosis not present

## 2022-05-08 DIAGNOSIS — R1310 Dysphagia, unspecified: Secondary | ICD-10-CM | POA: Diagnosis not present

## 2022-05-08 DIAGNOSIS — A419 Sepsis, unspecified organism: Secondary | ICD-10-CM | POA: Diagnosis not present

## 2022-05-08 LAB — BASIC METABOLIC PANEL
Anion gap: 7 (ref 5–15)
BUN: 25 mg/dL — ABNORMAL HIGH (ref 8–23)
CO2: 29 mmol/L (ref 22–32)
Calcium: 9.1 mg/dL (ref 8.9–10.3)
Chloride: 101 mmol/L (ref 98–111)
Creatinine, Ser: 0.95 mg/dL (ref 0.61–1.24)
GFR, Estimated: >60 mL/min (ref >60)
Glucose, Bld: 142 mg/dL — ABNORMAL HIGH (ref 70–99)
Potassium: 3.7 mmol/L (ref 3.5–5.1)
Sodium: 137 mmol/L (ref 135–145)

## 2022-05-08 LAB — GLUCOSE, CAPILLARY
Glucose-Capillary: 153 mg/dL — ABNORMAL HIGH (ref 70–99)
Glucose-Capillary: 266 mg/dL — ABNORMAL HIGH (ref 70–99)

## 2022-05-08 MED ORDER — EMPAGLIFLOZIN 25 MG PO TABS: 25.0000 mg | ORAL_TABLET | Freq: Every day | ORAL | 0 refills | Status: DC

## 2022-05-08 MED ORDER — LOSARTAN POTASSIUM 25 MG PO TABS: 12.5000 mg | ORAL_TABLET | Freq: Every day | ORAL | Status: DC

## 2022-05-08 MED ORDER — TRAMADOL HCL 50 MG PO TABS: 50.0000 mg | ORAL_TABLET | Freq: Four times a day (QID) | ORAL | 0 refills | Status: DC | PRN

## 2022-05-08 MED ORDER — DIPHENHYDRAMINE-ZINC ACETATE 2-0.1 % EX CREA
TOPICAL_CREAM | Freq: Three times a day (TID) | CUTANEOUS | Status: DC | PRN
  Filled 2022-05-08: qty 28

## 2022-05-08 MED ORDER — METOPROLOL SUCCINATE ER 25 MG PO TB24: 25.0000 mg | ORAL_TABLET | Freq: Every day | ORAL | Status: DC

## 2022-05-08 MED ORDER — FUROSEMIDE 20 MG PO TABS: 30.0000 mg | ORAL_TABLET | Freq: Two times a day (BID) | ORAL | Status: DC

## 2022-05-08 MED ORDER — QUETIAPINE FUMARATE 25 MG PO TABS: 25.0000 mg | ORAL_TABLET | Freq: Every day | ORAL | Status: DC

## 2022-05-08 MED ORDER — DIPHENHYDRAMINE HCL 25 MG PO CAPS
25.0000 mg | ORAL_CAPSULE | Freq: Once | ORAL | Status: AC
  Administered 2022-05-08: 25 mg via ORAL
  Filled 2022-05-08: qty 1

## 2022-05-08 NOTE — Discharge Summary (Signed)
Physician Discharge Summary  [name] IHK:[id number] DOB: Sep 05, 1946 DOA: 04/29/2022  PCP: Josetta Huddle, MD  Admit date: 04/29/2022 Discharge date: 05/08/2022  Admitted From: Home Disposition: Skilled nursing facility  Recommendations for Outpatient Follow-up:  Follow up with PCP in 1-2 weeks Please obtain BMP/CBC in one week Follow-up with cardiology to consider outpatient ischemic work-up Follow-up with urology, Dr. Gloriann Loan to be considered for urodynamic studies Follow-up with Dr. Juleen China on 8/15 from infectious disease  Discharge Condition: Stable CODE STATUS: Full code Diet recommendation: Heart healthy, carb modified  Brief/Interim Summary: [name] is a 76 y.o. 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  Recently hospitalized 7/7-7/9 for same treated with meropenem, has been weaker since then -Brought to the ED 7/16 evening with significant weakness, febrile to 101.4, UA abnormal again, chest x-ray was unremarkable, also  followed by ID for recurrent ESBL UTIs  Discharge Diagnoses:  Principal Problem:   UTI due to extended-spectrum beta lactamase (ESBL) producing Escherichia coli Active Problems:   Parkinson disease ([place])   Diabetes ([place])   BPH (benign prostatic hyperplasia)   HTN (hypertension)   Dysphagia   Acute combined systolic and diastolic heart failure ([place])  Recurrent ESBL UTI -sepsis on admission ruled out -Recent admission for Klebsiella ESBL and few earlier this year -Cultures unchanged -seen by urology. CT pelvis without prostatic abscess. Will need to follow up with urology -Seen by infectious disease and treated with 5 days of meropenem -Outpatient follow-up with infectious diseases already been scheduled   Dysphagia -worsening over the past week. Could be worsen  progression of Parkinson -SLP evaluation completed, mild aspiration risk, regular diet with thin liquids recommended for now   HFmrEF Acute combined CHF -Echo shows EF of 45-50% which is a decline from prior readings -cardiology consulted, appreciate assistance -Diuresis with IV Lasix and eventually transition to p.o. -Monitor intake and output -started on toprol, ARB, Jardiance -plans for outpatient ischemic work up   HTN (hypertension) Continue Losartan   Diabetes ([place]) -Resume basal insulin on discharge -Resume metformin -Started on Jardiance -Continue to follow blood sugars   Parkinson disease ([place]) Worsening weakness likely due to current UTI -continue home Sinemet, entacapone and requip - PT/OT consult -SNF placement -Appears to be tolerating Seroquel nightly  Discharge Instructions  Discharge Instructions     Diet - low sodium heart healthy   Complete by: As directed    Increase activity slowly   Complete by: As directed       Allergies as of 05/08/2022       Reactions   Nucynta [tapentadol] Other (See Comments)   Made the patient not feel well. "He did not feel well at all."   Oxycodone Itching   Sudafed [pseudoephedrine Hcl] Other (See Comments)   Problems urinating    Tramadol Hcl Itching, Other (See Comments)   Can tolerate, however (in 2023)        Medication List     STOP taking these medications    Aleve 220 MG tablet Generic drug: naproxen sodium   amoxicillin 500 MG capsule Commonly known as: AMOXIL   lisinopril 5 MG tablet Commonly known as: ZESTRIL   nitrofurantoin (macrocrystal-monohydrate) 100 MG capsule Commonly known as: Macrobid       TAKE these medications    acetaminophen 500 MG tablet Commonly known as: TYLENOL Take 1,000 mg by  mouth every 6 (six) hours as needed (for pain).   carbidopa-levodopa 25-100 MG tablet Commonly known as: SINEMET IR Take 3 tablets by mouth 4 (four) times daily. What changed:  when to  take this additional instructions   cholecalciferol 1000 units tablet Commonly known as: VITAMIN D Take 2,000 Units by mouth daily.   clotrimazole 1 % cream Commonly known as: Lotrimin AF Apply 1 application. topically 2 (two) times daily. What changed:  when to take this reasons to take this   empagliflozin 25 MG Tabs tablet Commonly known as: Jardiance Take 1 tablet (25 mg total) by mouth daily before breakfast.   entacapone 200 MG tablet Commonly known as: COMTAN TAKE 1 TABLET BY MOUTH 3 TIMES DAILY (WITH FIRST THREE DOSAGES OF CARBIDOPA/LEVODOPA). What changed: See the new instructions.   furosemide 20 MG tablet Commonly known as: LASIX Take 1.5 tablets (30 mg total) by mouth 2 (two) times daily. What changed:  how much to take when to take this   Global Ease Inject Pen Needles 31G X 5 MM Misc Generic drug: Insulin Pen Needle Inject into the skin.   hydrocortisone cream 0.5 % Apply 1 application. topically 2 (two) times daily. What changed:  when to take this reasons to take this   Lantus SoloStar 100 UNIT/ML Solostar Pen Generic drug: insulin glargine Inject 15-20 Units into the skin at bedtime.   losartan 25 MG tablet Commonly known as: COZAAR Take 0.5 tablets (12.5 mg total) by mouth daily. Start taking on: May 09, 2022   metFORMIN 500 MG tablet Commonly known as: GLUCOPHAGE Take 500 mg by mouth 2 (two) times daily with a meal.   methenamine 1 g tablet Commonly known as: MANDELAMINE Take 1 tablet (1,000 mg total) by mouth 2 (two) times daily. Take with any vitamin c, twice a day for uti prevention What changed:  when to take this additional instructions   metoprolol succinate 25 MG 24 hr tablet Commonly known as: TOPROL-XL Take 1 tablet (25 mg total) by mouth daily. Start taking on: May 09, 2022   omeprazole 20 MG capsule Commonly known as: PRILOSEC Take 20 mg by mouth daily as needed (acid reflux).   potassium chloride SA 20 MEQ  tablet Commonly known as: KLOR-CON M Take 10 mEq by mouth 2 (two) times daily.   QUEtiapine 25 MG tablet Commonly known as: SEROQUEL Take 1 tablet (25 mg total) by mouth at bedtime.   rOPINIRole 3 MG tablet Commonly known as: REQUIP Take 1 tablet in the morning, Take  1 in the afternoon, Take a  half tablet in the evening What changed:  how much to take how to take this when to take this additional instructions   tamsulosin 0.4 MG Caps capsule Commonly known as: FLOMAX Take 0.4 mg by mouth in the morning and at bedtime.   traMADol 50 MG tablet Commonly known as: ULTRAM Take 1 tablet (50 mg total) by mouth every 6 (six) hours as needed (for pain).   triamcinolone 55 MCG/ACT Aero nasal inhaler Commonly known as: NASACORT Place 2 sprays into the nose daily as needed (for allergies or rhinitis).   Tums 500 MG chewable tablet Generic drug: calcium carbonate Chew 2 tablets by mouth daily as needed for indigestion or heartburn.   vitamin B-12 1000 MCG tablet Commonly known as: CYANOCOBALAMIN Take 1,000 mcg by mouth daily.   vitamin C 100 MG tablet Take 100 mg by mouth in the morning and at bedtime.  Allergies  Allergen Reactions   Nucynta [Tapentadol] Other (See Comments)    Made the patient not feel well. "He did not feel well at all."   Oxycodone Itching   Sudafed [Pseudoephedrine Hcl] Other (See Comments)    Problems urinating    Tramadol Hcl Itching and Other (See Comments)    Can tolerate, however (in 2023)    Consultations: Urology Infectious disease cardiology   Procedures/Studies: ECHOCARDIOGRAM COMPLETE  Result Date: 05/04/2022    ECHOCARDIOGRAM REPORT   Patient Name:   LANGSTON TUBERVILLE Date of Exam: 05/04/2022 Medical Rec #:  [id number]   Height:       72.0 in Accession #:    [id number]  Weight:       190.0 lb Date of Birth:  1945-12-17   BSA:          2.085 m Patient Age:    76 years    BP:           130/75 mmHg Patient Gender: M           HR:            70 bpm. Exam Location:  Inpatient Procedure: 2D Echo, Cardiac Doppler and Color Doppler Indications:    CHF  History:        Patient has prior history of Echocardiogram examinations. Risk                 Factors:Hypertension and Diabetes.  Sonographer:    Jyl Heinz Referring Phys: [place]  1. Inferior septal and apical hypokinesis . Left ventricular ejection fraction, by estimation, is 45 to 50%. The left ventricle has mildly decreased function. The left ventricle demonstrates regional wall motion abnormalities (see scoring diagram/findings for description). The left ventricular internal cavity size was moderately dilated. There is moderate left ventricular hypertrophy. Left ventricular diastolic parameters are indeterminate.  2. Right ventricular systolic function is normal. The right ventricular size is normal.  3. Left atrial size was mild to moderately dilated.  4. The mitral valve is abnormal. Trivial mitral valve regurgitation. No evidence of mitral stenosis.  5. The aortic valve is tricuspid. There is mild calcification of the aortic valve. There is mild thickening of the aortic valve. Aortic valve regurgitation is not visualized. Aortic valve sclerosis is present, with no evidence of aortic valve stenosis.  6. The inferior vena cava is dilated in size with >50% respiratory variability, suggesting right atrial pressure of 8 mmHg. FINDINGS  Left Ventricle: Inferior septal and apical hypokinesis. Left ventricular ejection fraction, by estimation, is 45 to 50%. The left ventricle has mildly decreased function. The left ventricle demonstrates regional wall motion abnormalities. The left ventricular internal cavity size was moderately dilated. There is moderate left ventricular hypertrophy. Left ventricular diastolic parameters are indeterminate. Right Ventricle: The right ventricular size is normal. No increase in right ventricular wall thickness. Right ventricular systolic function  is normal. Left Atrium: Left atrial size was mild to moderately dilated. Right Atrium: Right atrial size was normal in size. Pericardium: There is no evidence of pericardial effusion. Mitral Valve: The mitral valve is abnormal. There is mild thickening of the mitral valve leaflet(s). There is mild calcification of the mitral valve leaflet(s). Trivial mitral valve regurgitation. No evidence of mitral valve stenosis. Tricuspid Valve: The tricuspid valve is normal in structure. Tricuspid valve regurgitation is mild . No evidence of tricuspid stenosis. Aortic Valve: The aortic valve is tricuspid. There is mild calcification of the aortic valve. There  is mild thickening of the aortic valve. Aortic valve regurgitation is not visualized. Aortic valve sclerosis is present, with no evidence of aortic valve stenosis. Aortic valve peak gradient measures 8.6 mmHg. Pulmonic Valve: The pulmonic valve was normal in structure. Pulmonic valve regurgitation is not visualized. No evidence of pulmonic stenosis. Aorta: The aortic root is normal in size and structure. Venous: The inferior vena cava is dilated in size with greater than 50% respiratory variability, suggesting right atrial pressure of 8 mmHg. IAS/Shunts: No atrial level shunt detected by color flow Doppler.  LEFT VENTRICLE PLAX 2D LVIDd:         5.50 cm      Diastology LVIDs:         4.60 cm      LV e' medial:    5.44 cm/s LV PW:         1.30 cm      LV E/e' medial:  13.2 LV IVS:        1.50 cm      LV e' lateral:   6.64 cm/s LVOT diam:     2.00 cm      LV E/e' lateral: 10.8 LV SV:         68 LV SV Index:   33 LVOT Area:     3.14 cm  LV Volumes (MOD) LV vol d, MOD A2C: 156.0 ml LV vol d, MOD A4C: 161.0 ml LV vol s, MOD A2C: 73.3 ml LV vol s, MOD A4C: 76.4 ml LV SV MOD A2C:     82.7 ml LV SV MOD A4C:     161.0 ml LV SV MOD BP:      85.3 ml RIGHT VENTRICLE             IVC RV Basal diam:  2.80 cm     IVC diam: 2.70 cm RV Mid diam:    2.70 cm RV S prime:     12.00 cm/s TAPSE  (M-mode): 1.6 cm LEFT ATRIUM             Index        RIGHT ATRIUM           Index LA diam:        4.20 cm 2.01 cm/m   RA Area:     14.40 cm LA Vol (A2C):   66.2 ml 31.76 ml/m  RA Volume:   33.20 ml  15.93 ml/m LA Vol (A4C):   91.0 ml 43.65 ml/m LA Biplane Vol: 79.5 ml 38.14 ml/m  AORTIC VALVE AV Area (Vmax): 2.02 cm AV Vmax:        147.00 cm/s AV Peak Grad:   8.6 mmHg LVOT Vmax:      94.60 cm/s LVOT Vmean:     72.400 cm/s LVOT VTI:       0.216 m  AORTA Ao Root diam: 3.10 cm Ao Asc diam:  3.40 cm MITRAL VALVE               TRICUSPID VALVE MV Area (PHT): 5.09 cm    TR Peak grad:   34.8 mmHg MV Decel Time: 149 msec    TR Vmax:        295.00 cm/s MV E velocity: 71.80 cm/s MV A velocity: 73.20 cm/s  SHUNTS MV E/A ratio:  0.98        Systemic VTI:  0.22 m  Systemic Diam: 2.00 cm Jenkins Rouge MD Electronically signed by Jenkins Rouge MD Signature Date/Time: 05/04/2022/2:24:43 PM    Final    DG CHEST PORT 1 VIEW  Result Date: 05/03/2022 CLINICAL DATA:  Shortness of breath. EXAM: PORTABLE CHEST 1 VIEW COMPARISON:  Chest radiograph 04/30/2022. FINDINGS: The patient is rotated limiting assessment. Apparent rightward shift of the heart is likely due to rotation. Mild cardiomegaly. Question of septal thickening at the right lung base. Small pleural effusions. Trace fluid in the right minor fissure. No pneumothorax. IMPRESSION: Small pleural effusions. Question of septal thickening at the right lung base, suspicious for pulmonary edema. Mild cardiomegaly. Electronically Signed   By: Keith Rake M.D.   On: 05/03/2022 18:12   CT PELVIS W CONTRAST  Result Date: 05/01/2022 CLINICAL DATA:  Prostatitis. EXAM: CT PELVIS WITH CONTRAST TECHNIQUE: Multidetector CT imaging of the pelvis was performed using the standard protocol following the bolus administration of intravenous contrast. RADIATION DOSE REDUCTION: This exam was performed according to the departmental dose-optimization program  which includes automated exposure control, adjustment of the mA and/or kV according to patient size and/or use of iterative reconstruction technique. CONTRAST:  197m OMNIPAQUE IOHEXOL 300 MG/ML  SOLN COMPARISON:  None Available. FINDINGS: Urinary Tract: Mild circumferential bladder wall thickening. No bladder stone. Bowel: No obstruction or inflammation of pelvic bowel loops. Normal appendix. Moderate stool in the included colon. Vascular/Lymphatic: Aorto bi-iliac atherosclerosis. There multiple small inguinal lymph nodes, not enlarged by size criteria. No pelvic sidewall adenopathy. Reproductive: Heterogeneous and enlarged prostate gland, prostate spans 5.6 cm transverse. Scattered prostatic calcifications. There is no evidence of prostatic fluid collection. There is no discrete prostatic inflammation. Unremarkable seminal vesicles. Other: Generalized edema of the subcutaneous and intrapelvic fat. More confluent edema is seen in the penile fat. No intrapelvic fluid collection. Foci of soft tissue gas in the anterior abdominal wall often seen with medication injection sites. Musculoskeletal: No focal bone lesion. Mild osteoarthritis of both hips. Degenerative disc disease at L4-L5 and L5-S1. IMPRESSION: 1. Heterogeneous and enlarged prostate gland without evidence of prostatic fluid collection or discrete prostatic inflammation. 2. Mild circumferential bladder wall thickening, may be due to chronic bladder outlet obstruction or cystitis. 3. Generalized edema of subcutaneous and intrapelvic fat, nonspecific. Aortic Atherosclerosis (ICD10-I70.0). Electronically Signed   By: MKeith RakeM.D.   On: 05/01/2022 18:33   DG CHEST PORT 1 VIEW  Result Date: 04/30/2022 CLINICAL DATA:  Shortness of breath. EXAM: PORTABLE CHEST 1 VIEW COMPARISON:  04/29/2022. FINDINGS: Examination is limited due to patient rotation. The heart is enlarged and the mediastinal contour is stable. Lung volumes are low with patchy airspace  disease at the lung bases. There are hazy opacities at the lung bases in the possibility of pleural effusions can not be excluded. No definite pneumothorax. No acute osseous abnormality. IMPRESSION: 1. Patchy airspace disease and hazy opacification at the lung bases, possible atelectasis or infiltrate. The possibility of small pleural effusions can not be excluded. 2. Cardiomegaly. Electronically Signed   By: LBrett FairyM.D.   On: 04/30/2022 23:44   DG Chest Port 1 View  Result Date: 04/29/2022 CLINICAL DATA:  Possible sepsis EXAM: PORTABLE CHEST 1 VIEW COMPARISON:  04/20/2022 FINDINGS: Transverse diameter of the heart is in the upper limits of normal. There are no signs of pulmonary edema or focal pulmonary consolidation. Left hemidiaphragm is elevated. There is no pleural effusion or pneumothorax. IMPRESSION: No active disease. Electronically Signed   By: PElmer PickerM.D.   On:  04/29/2022 17:27   DG Chest Port 1 View  Result Date: 04/20/2022 CLINICAL DATA:  Weakness EXAM: PORTABLE CHEST 1 VIEW COMPARISON:  03/01/2022 FINDINGS: Mild cardiomegaly. Both lungs are clear. The visualized skeletal structures are unremarkable. IMPRESSION: Mild cardiomegaly without acute abnormality of the lungs in AP portable projection. Electronically Signed   By: Delanna Ahmadi M.D.   On: 04/20/2022 16:29   MR ABDOMEN WWO CONTRAST  Result Date: 04/16/2022 CLINICAL DATA:  Right renal neoplasm follow-up EXAM: MRI ABDOMEN WITHOUT AND WITH CONTRAST TECHNIQUE: Multiplanar multisequence MR imaging of the abdomen was performed both before and after the administration of intravenous contrast. CONTRAST:  65m MULTIHANCE GADOBENATE DIMEGLUMINE 529 MG/ML IV SOLN COMPARISON:  MRI abdomen 10/22/2021 FINDINGS: Study is limited due to motion. Lower chest: No acute findings. Hepatobiliary: Liver is normal in size and contour with no suspicious mass identified. Tiny hepatic cyst anteriorly. 1.8 cm gallstone. No gallbladder wall  thickening or surrounding edema. No biliary ductal dilatation. Pancreas: A few tiny cystic foci are visualized in the pancreas measuring up to 3 mm in the tail and a cyst or focal ductal dilatation in the head of the pancreas measuring up to 6.5 mm in diameter. No inflammatory changes identified. Spleen:  Within normal limits in size and appearance. Adrenals/Urinary Tract: Adrenal glands appear normal. A few small renal cysts are again seen bilaterally measuring up to 1.3 cm in the posterior left kidney. Stable size of a complex cystic mass in the lateral right kidney measuring 1.1 cm with enhancing internal septations. No hydronephrosis. Stomach/Bowel: Visualized portions within the abdomen are unremarkable. Vascular/Lymphatic: No pathologically enlarged lymph nodes identified. No abdominal aortic aneurysm demonstrated. Other:  No ascites. Musculoskeletal: No suspicious bone lesions identified. Previous fracture of L1 partially visualized. IMPRESSION: 1. 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 2. Small renal cysts. 3. Tiny cystic lesions in the pancreas as described which can be followed along with the renal mass. 4. Cholelithiasis. 5. Previous L1 fracture. Electronically Signed   By: DOfilia NeasM.D.   On: 04/16/2022 15:13      Subjective: He feels well today.  He slept well overnight.  Denies any shortness of breath.  Discharge Exam: Vitals:   05/07/22 1255 05/07/22 2018 05/08/22 0630 05/08/22 1203  BP: 137/70 131/67 (!) 114/56 105/81  Pulse: 64 (!) 55 (!) 58 62  Resp: '18 18 18 20  '$ Temp: 98.6 F (37 C) 98.2 F (36.8 C) 98.4 F (36.9 C) 98 F (36.7 C)  TempSrc: Oral Oral Oral Oral  SpO2: 98% 100% 97%   Weight:      Height:        General: Pt is alert, awake, not in acute distress Cardiovascular: RRR, S1/S2 +, no rubs, no gallops Respiratory: CTA bilaterally, no wheezing, no rhonchi Abdominal: Soft, NT, ND, bowel sounds  + Extremities: no edema, no cyanosis    The results of significant diagnostics from this hospitalization (including imaging, microbiology, ancillary and laboratory) are listed below for reference.     Microbiology: Recent Results (from the past 240 hour(s))  Blood Culture (routine x 2)     Status: None   Collection Time: 04/29/22  4:37 PM   Specimen: BLOOD  Result Value Ref Range Status   Specimen Description   Final    BLOOD BLOOD LEFT HAND Performed at [facility], [place] [address]   Special Requests   Final  BOTTLES DRAWN AEROBIC ONLY Blood Culture results may not be optimal due to an inadequate volume of blood received in culture bottles Performed at [facility], [place] [address], [place], [place] [address]    Culture   Final    NO GROWTH 5 DAYS Performed at [facility], [place] [address] [place], [place] [address]    Report Status 05/04/2022 FINAL  Final  Resp Panel by RT-PCR (Flu A&B, Covid) Anterior Nasal Swab     Status: None   Collection Time: 04/29/22  4:40 PM   Specimen: Anterior Nasal Swab  Result Value Ref Range Status   SARS Coronavirus 2 by RT PCR NEGATIVE NEGATIVE Final    Comment: (NOTE) SARS-CoV-2 target nucleic acids are NOT DETECTED.  The SARS-CoV-2 RNA is generally detectable in upper respiratory specimens during the acute phase of infection. The lowest concentration of SARS-CoV-2 viral copies this assay can detect is 138 copies/mL. A negative result does not preclude SARS-Cov-2 infection and should not be used as the sole basis for treatment or other patient management decisions. A negative result may occur with  improper specimen collection/handling, submission of specimen other than nasopharyngeal swab, presence of viral mutation(s) within the areas targeted by this assay, and inadequate number of viral copies(<138 copies/mL). A negative result must be combined with clinical  observations, patient history, and epidemiological information. The expected result is Negative.  Fact Sheet for Patients:  EntrepreneurPulse.com.au  Fact Sheet for Healthcare Providers:  IncredibleEmployment.be  This test is no t yet approved or cleared by the [place] FDA and  has been authorized for detection and/or diagnosis of SARS-CoV-2 by FDA under an Emergency Use Authorization (EUA). This EUA will remain  in effect (meaning this test can be used) for the duration of the COVID-19 declaration under Section 564(b)(1) of the Act, 21 U.S.C.section 360bbb-3(b)(1), unless the authorization is terminated  or revoked sooner.       Influenza A by PCR NEGATIVE NEGATIVE Final   Influenza B by PCR NEGATIVE NEGATIVE Final    Comment: (NOTE) The Xpert Xpress SARS-CoV-2/FLU/RSV plus assay is intended as an aid in the diagnosis of influenza from Nasopharyngeal swab specimens and should not be used as a sole basis for treatment. Nasal washings and aspirates are unacceptable for Xpert Xpress SARS-CoV-2/FLU/RSV testing.  Fact Sheet for Patients: EntrepreneurPulse.com.au  Fact Sheet for Healthcare Providers: IncredibleEmployment.be  This test is not yet approved or cleared by the [place] FDA and has been authorized for detection and/or diagnosis of SARS-CoV-2 by FDA under an Emergency Use Authorization (EUA). This EUA will remain in effect (meaning this test can be used) for the duration of the COVID-19 declaration under Section 564(b)(1) of the Act, 21 U.S.C. section 360bbb-3(b)(1), unless the authorization is terminated or revoked.  Performed at [facility], [place] [address] [place], [place] [address]   Urine Culture     Status: Abnormal   Collection Time: 04/29/22  4:40 PM   Specimen: In/Out Cath Urine  Result Value Ref Range Status   Specimen Description   Final    IN/OUT CATH  URINE Performed at [facility] [address] [place], [place] [address]    Special Requests   Final    NONE Performed at [facility], [place] [address], [place], [place] [address]    Culture (A)  Final    >=100,000 COLONIES/mL KLEBSIELLA PNEUMONIAE Confirmed Extended Spectrum Beta-Lactamase Producer (ESBL).  In bloodstream infections from ESBL  organisms, carbapenems are preferred over piperacillin/tazobactam. They are shown to have a lower risk of mortality.    Report Status 05/01/2022 FINAL  Final   Organism ID, Bacteria KLEBSIELLA PNEUMONIAE (A)  Final      Susceptibility   Klebsiella pneumoniae - MIC*    AMPICILLIN >=32 RESISTANT Resistant     CEFAZOLIN >=64 RESISTANT Resistant     CEFEPIME >=32 RESISTANT Resistant     CEFTRIAXONE >=64 RESISTANT Resistant     CIPROFLOXACIN >=4 RESISTANT Resistant     GENTAMICIN >=16 RESISTANT Resistant     IMIPENEM 0.5 SENSITIVE Sensitive     NITROFURANTOIN 256 RESISTANT Resistant     TRIMETH/SULFA >=320 RESISTANT Resistant     AMPICILLIN/SULBACTAM >=32 RESISTANT Resistant     PIP/TAZO >=128 RESISTANT Resistant     * >=100,000 COLONIES/mL KLEBSIELLA PNEUMONIAE  Blood Culture (routine x 2)     Status: None   Collection Time: 04/29/22  6:00 PM   Specimen: BLOOD  Result Value Ref Range Status   Specimen Description   Final    BLOOD LEFT ANTECUBITAL Performed at [facility] [address], [place], [place] [address]    Special Requests   Final    BOTTLES DRAWN AEROBIC AND ANAEROBIC Blood Culture adequate volume Performed at [facility] [address] [place], [place] [address]    Culture   Final    NO GROWTH 5 DAYS Performed at [facility], [place] [address] [place], [place] [address]    Report Status 05/05/2022 FINAL  Final     Labs: BNP (last 3 results) Recent Labs    05/05/22 0319 05/06/22 0547 05/07/22 0514  BNP 435.5* 369.1* 266.5*    Basic Metabolic Panel: Recent Labs  Lab 05/04/22 0345 05/05/22 0319 05/06/22 0547 05/07/22 0514 05/08/22 0616  NA 138 138 137 138 137  K 3.7 3.9 3.8 4.1 3.7  CL 103 104 101 101 101  CO2 '28 29 29 30 29  '$ GLUCOSE 168* 173* 213* 249* 142*  BUN 17 24* 29* 29* 25*  CREATININE 0.91 0.84 1.00 1.01 0.95  CALCIUM 8.6* 8.7* 8.7* 9.2 9.1   Liver Function Tests: No results for input(s): "AST", "ALT", "ALKPHOS", "BILITOT", "PROT", "ALBUMIN" in the last 168 hours. No results for input(s): "LIPASE", "AMYLASE" in the last 168 hours. No results for input(s): "AMMONIA" in the last 168 hours. CBC: Recent Labs  Lab 05/02/22 0539  WBC 6.5  HGB 12.4*  HCT 37.6*  MCV 90.8  PLT 232   Cardiac Enzymes: No results for input(s): "CKTOTAL", "CKMB", "CKMBINDEX", "TROPONINI" in the last 168 hours. BNP: Invalid input(s): "POCBNP" CBG: Recent Labs  Lab 05/07/22 1251 05/07/22 1653 05/07/22 2019 05/08/22 0740 05/08/22 1200  GLUCAP 363* 100* 130* 153* 266*   D-Dimer No results for input(s): "DDIMER" in the last 72 hours. Hgb A1c No results for input(s): "HGBA1C" in the last 72 hours. Lipid Profile No results for input(s): "CHOL", "HDL", "LDLCALC", "TRIG", "CHOLHDL", "LDLDIRECT" in the last 72 hours. Thyroid function studies No results for input(s): "TSH", "T4TOTAL", "T3FREE", "THYROIDAB" in the last 72 hours.  Invalid input(s): "FREET3" Anemia work up No results for input(s): "VITAMINB12", "FOLATE", "FERRITIN", "TIBC", "IRON", "RETICCTPCT" in the last 72 hours. Urinalysis    Component Value Date/Time   COLORURINE YELLOW 04/29/2022 1640   APPEARANCEUR HAZY (A) 04/29/2022 1640   LABSPEC 1.015 04/29/2022 1640   PHURINE 6.0 04/29/2022 1640   GLUCOSEU 150 (A) 04/29/2022 1640   HGBUR SMALL (A) 04/29/2022  1640   BILIRUBINUR NEGATIVE 04/29/2022 1640   KETONESUR 5 (A) 04/29/2022 1640   PROTEINUR 100 (A) 04/29/2022 1640   UROBILINOGEN 0.2 10/25/2009 1404   NITRITE POSITIVE (A) 04/29/2022  1640   LEUKOCYTESUR LARGE (A) 04/29/2022 1640   Sepsis Labs Recent Labs  Lab 05/02/22 0539  WBC 6.5   Microbiology Recent Results (from the past 240 hour(s))  Blood Culture (routine x 2)     Status: None   Collection Time: 04/29/22  4:37 PM   Specimen: BLOOD  Result Value Ref Range Status   Specimen Description   Final    BLOOD BLOOD LEFT HAND Performed at [facility], [place] [address], [place], [place] [address]    Special Requests   Final    BOTTLES DRAWN AEROBIC ONLY Blood Culture results may not be optimal due to an inadequate volume of blood received in culture bottles Performed at [facility] [address], [place], [place] [address]    Culture   Final    NO GROWTH 5 DAYS Performed at [facility], [place] [address], [place], [place] [address]    Report Status 05/04/2022 FINAL  Final  Resp Panel by RT-PCR (Flu A&B, Covid) Anterior Nasal Swab     Status: None   Collection Time: 04/29/22  4:40 PM   Specimen: Anterior Nasal Swab  Result Value Ref Range Status   SARS Coronavirus 2 by RT PCR NEGATIVE NEGATIVE Final    Comment: (NOTE) SARS-CoV-2 target nucleic acids are NOT DETECTED.  The SARS-CoV-2 RNA is generally detectable in upper respiratory specimens during the acute phase of infection. The lowest concentration of SARS-CoV-2 viral copies this assay can detect is 138 copies/mL. A negative result does not preclude SARS-Cov-2 infection and should not be used as the sole basis for treatment or other patient management decisions. A negative result may occur with  improper specimen collection/handling, submission of specimen other than nasopharyngeal swab, presence of viral mutation(s) within the areas targeted by this assay, and inadequate number of viral copies(<138 copies/mL). A negative result must be combined with clinical observations, patient history, and epidemiological information. The expected result is  Negative.  Fact Sheet for Patients:  EntrepreneurPulse.com.au  Fact Sheet for Healthcare Providers:  IncredibleEmployment.be  This test is no t yet approved or cleared by the [place] FDA and  has been authorized for detection and/or diagnosis of SARS-CoV-2 by FDA under an Emergency Use Authorization (EUA). This EUA will remain  in effect (meaning this test can be used) for the duration of the COVID-19 declaration under Section 564(b)(1) of the Act, 21 U.S.C.section 360bbb-3(b)(1), unless the authorization is terminated  or revoked sooner.       Influenza A by PCR NEGATIVE NEGATIVE Final   Influenza B by PCR NEGATIVE NEGATIVE Final    Comment: (NOTE) The Xpert Xpress SARS-CoV-2/FLU/RSV plus assay is intended as an aid in the diagnosis of influenza from Nasopharyngeal swab specimens and should not be used as a sole basis for treatment. Nasal washings and aspirates are unacceptable for Xpert Xpress SARS-CoV-2/FLU/RSV testing.  Fact Sheet for Patients: EntrepreneurPulse.com.au  Fact Sheet for Healthcare Providers: IncredibleEmployment.be  This test is not yet approved or cleared by the [place] FDA and has been authorized for detection and/or diagnosis of SARS-CoV-2 by FDA under an Emergency Use Authorization (EUA). This EUA will remain in effect (meaning this test can be used) for the duration of the COVID-19 declaration under Section 564(b)(1) of the Act,  21 U.S.C. section 360bbb-3(b)(1), unless the authorization is terminated or revoked.  Performed at [facility], [place] [address], [place], [place] [address]   Urine Culture     Status: Abnormal   Collection Time: 04/29/22  4:40 PM   Specimen: In/Out Cath Urine  Result Value Ref Range Status   Specimen Description   Final    IN/OUT CATH URINE Performed at [facility] [address] [place],  [place] [address]    Special Requests   Final    NONE Performed at [facility], [place] [address], [place], [place] [address]    Culture (A)  Final    >=100,000 COLONIES/mL KLEBSIELLA PNEUMONIAE Confirmed Extended Spectrum Beta-Lactamase Producer (ESBL).  In bloodstream infections from ESBL organisms, carbapenems are preferred over piperacillin/tazobactam. They are shown to have a lower risk of mortality.    Report Status 05/01/2022 FINAL  Final   Organism ID, Bacteria KLEBSIELLA PNEUMONIAE (A)  Final      Susceptibility   Klebsiella pneumoniae - MIC*    AMPICILLIN >=32 RESISTANT Resistant     CEFAZOLIN >=64 RESISTANT Resistant     CEFEPIME >=32 RESISTANT Resistant     CEFTRIAXONE >=64 RESISTANT Resistant     CIPROFLOXACIN >=4 RESISTANT Resistant     GENTAMICIN >=16 RESISTANT Resistant     IMIPENEM 0.5 SENSITIVE Sensitive     NITROFURANTOIN 256 RESISTANT Resistant     TRIMETH/SULFA >=320 RESISTANT Resistant     AMPICILLIN/SULBACTAM >=32 RESISTANT Resistant     PIP/TAZO >=128 RESISTANT Resistant     * >=100,000 COLONIES/mL KLEBSIELLA PNEUMONIAE  Blood Culture (routine x 2)     Status: None   Collection Time: 04/29/22  6:00 PM   Specimen: BLOOD  Result Value Ref Range Status   Specimen Description   Final    BLOOD LEFT ANTECUBITAL Performed at [facility] [address], [place], [place] [address]    Special Requests   Final    BOTTLES DRAWN AEROBIC AND ANAEROBIC Blood Culture adequate volume Performed at [facility] [address], [place], [place] [address]    Culture   Final    NO GROWTH 5 DAYS Performed at [facility], [place] [address], [place], [place] [address]    Report Status 05/05/2022 FINAL  Final     Time coordinating discharge: 16mns  SIGNED:   JKathie Dike MD  [facility] 05/08/2022, 2:50 PM   If 7PM-7AM, please contact night-coverage [email or web address]

## 2022-05-08 NOTE — Progress Notes (Signed)
Physical Therapy Treatment Patient Details [name] MRN: [id number] DOB: Oct 08, 1946 Today's Date: 05/08/2022   History of Present Illness Patient is a 76 year old male who was recently admitted from 7/7 to 7/9 with UTI. patient has a history of recurrent UTIs with hospitalizations in March and May for UTI. patient was admitted this hospitalization with UTI due to extended sepctrum beta lactamase producing Escherichia coli and dysphagia. PMH: T12 spinous process fx, L1 ant-middle colum fx and sacral fx.    PT Comments    Pt supine in bed agreeable to be seen. Required supervision for bed mobility, donned brace in sitting position without cuing, min guard for transfers and ambulation in hallway with RW. Encouraged pt to ambulate with staff outside of therapy session, Pt agreeable and RN aware. Discharge destination remains appropriate. We will continue to follow acutely.     Recommendations for follow up therapy are one component of a multi-disciplinary discharge planning process, led by the attending physician.  Recommendations may be updated based on patient status, additional functional criteria and insurance authorization.  Follow Up Recommendations  Skilled nursing-short term rehab (<3 hours/day) Can patient physically be transported by private vehicle: Yes   Assistance Recommended at Discharge Frequent or constant Supervision/Assistance  Patient can return home with the following A little help with walking and/or transfers;Assistance with cooking/housework;Assist for transportation;Help with stairs or ramp for entrance;A little help with bathing/dressing/bathroom   Equipment Recommendations  None recommended by PT    Recommendations for Other Services       Precautions / Restrictions Precautions Precautions: Fall Precaution Comments: back precautions for comfort Required Braces or Orthoses: Spinal Brace Spinal Brace: Lumbar corset;Applied in sitting position Spinal Brace  Comments: on when OOB Restrictions Weight Bearing Restrictions: No     Mobility  Bed Mobility Overal bed mobility: Needs Assistance Bed Mobility: Rolling, Sidelying to Sit Rolling: Supervision Sidelying to sit: Supervision, HOB elevated       General bed mobility comments: used rail, VCs for sequencing. Wife assisted with donning [place].    Transfers Overall transfer level: Needs assistance Equipment used: Rolling walker (2 wheels) Transfers: Sit to/from Stand Sit to Stand: From elevated surface, Min guard           General transfer comment: Cues for safety, technique, hand placement.    Ambulation/Gait Ambulation/Gait assistance: Min guard Gait Distance (Feet): 180 Feet Assistive device: Rolling walker (2 wheels) Gait Pattern/deviations: Festinating, Shuffle, Trunk flexed Gait velocity: WFL     General Gait Details: Ongoing VCs for proximity to RW and posture, shufling, festination, flexed trunk; no loss of balance   Stairs             Wheelchair Mobility    Modified Rankin (Stroke Patients Only)       Balance Overall balance assessment: Needs assistance, History of Falls Sitting-balance support: Feet supported Sitting balance-Leahy Scale: Fair   Postural control: Right lateral lean Standing balance support: Bilateral upper extremity supported, During functional activity, Reliant on assistive device for balance Standing balance-Leahy Scale: Poor                              Cognition Arousal/Alertness: Awake/alert Behavior During Therapy: WFL for tasks assessed/performed Overall Cognitive Status: Impaired/Different from baseline Area of Impairment: Problem solving, Safety/judgement                         Safety/Judgement: Decreased awareness  of deficits, Decreased awareness of safety   Problem Solving: Requires verbal cues General Comments: requires ongoing reminders to stay close to RW when ambulating         Exercises      General Comments General comments (skin integrity, edema, etc.): Wife nancy present for session      Pertinent Vitals/Pain Pain Assessment Pain Assessment: No/denies pain Faces Pain Scale: Hurts a little bit Pain Location: back Pain Descriptors / Indicators: Discomfort, Sore Pain Intervention(s): Monitored during session, Repositioned    Home Living                          Prior Function            PT Goals (current goals can now be found in the care plan section) Acute Rehab PT Goals Patient Stated Goal: ST-SNF PT Goal Formulation: With patient/family Time For Goal Achievement: 05/15/22 Potential to Achieve Goals: Good Progress towards PT goals: Progressing toward goals    Frequency    Min 2X/week      PT Plan Current plan remains appropriate    Co-evaluation              AM-PAC PT "6 Clicks" Mobility   Outcome Measure  Help needed turning from your back to your side while in a flat bed without using bedrails?: A Little Help needed moving from lying on your back to sitting on the side of a flat bed without using bedrails?: A Little Help needed moving to and from a bed to a chair (including a wheelchair)?: A Little Help needed standing up from a chair using your arms (e.g., wheelchair or bedside chair)?: A Little Help needed to walk in hospital room?: A Little Help needed climbing 3-5 steps with a railing? : A Lot 6 Click Score: 17    End of Session Equipment Utilized During Treatment: Gait belt;Back brace Activity Tolerance: Patient tolerated treatment well;No increased pain Patient left: in chair;with call bell/phone within reach;with chair alarm set;with nursing/sitter in room Nurse Communication: Mobility status PT Visit Diagnosis: Difficulty in walking, not elsewhere classified (R26.2);Muscle weakness (generalized) (M62.81);Other symptoms and signs involving the nervous system (R29.898);History of falling (Z91.81)      Time: 1050-1110 PT Time Calculation (min) (ACUTE ONLY): 20 min  Charges:  $Gait Training: 8-22 mins                     Coolidge Breeze, PT, DPT [facility] Office: [phone] Pager: [phone]   Coolidge Breeze 05/08/2022, 11:45 AM

## 2022-05-08 NOTE — Progress Notes (Signed)
Midline removed. Discharge instructions completed. Patient verbalized understanding of medication regimen, follow up appointments and discharge instructions. Patient belongings gathered and packed to discharge. Packet given to wife to pass on to [employer]

## 2022-05-08 NOTE — Progress Notes (Signed)
Report called to [facility]. 

## 2022-05-08 NOTE — Progress Notes (Signed)
Progress Note  Patient [name] Date of Encounter: 05/08/2022  [facility] Cardiologist: Candee Furbish, MD   Subjective   Feeling better.  Got some sleep last night.  Ambulated yesterday. Denies CP/SOB.   Inpatient Medications    Scheduled Meds:  carbidopa-levodopa  3 tablet Oral 5 times per day   Chlorhexidine Gluconate Cloth  6 each Topical Daily   cholecalciferol  2,000 Units Oral Daily   docusate sodium  100 mg Oral BID   enoxaparin (LOVENOX) injection  40 mg Subcutaneous Q24H   entacapone  200 mg Oral 3 times per day   furosemide  30 mg Oral BID   insulin aspart  0-9 Units Subcutaneous TID PC & HS   insulin aspart  3 Units Subcutaneous TID WC   insulin glargine-yfgn  10 Units Subcutaneous Daily   losartan  12.5 mg Oral Daily   methenamine  1,000 mg Oral BID   metoprolol succinate  25 mg Oral Daily   pantoprazole  40 mg Oral Daily   potassium chloride SA  20 mEq Oral BID   QUEtiapine  25 mg Oral QHS   rOPINIRole  1.5 mg Oral QHS   rOPINIRole  3 mg Oral BID AC   sodium chloride flush  10-40 mL Intracatheter Q12H   tamsulosin  0.4 mg Oral BID   vitamin B-12  1,000 mcg Oral Daily   vitamin C  250 mg Oral BID   Continuous Infusions:  PRN Meds: acetaminophen, diphenhydrAMINE-zinc acetate, guaiFENesin-dextromethorphan, mouth rinse, sodium chloride flush, traMADol   Vital Signs    Vitals:   05/07/22 1255 05/07/22 2018 05/08/22 0630 05/08/22 1203  BP: 137/70 131/67 (!) 114/56 105/81  Pulse: 64 (!) 55 (!) 58 62  Resp: '18 18 18 20  '$ Temp: 98.6 F (37 C) 98.2 F (36.8 C) 98.4 F (36.9 C) 98 F (36.7 C)  TempSrc: Oral Oral Oral Oral  SpO2: 98% 100% 97%   Weight:      Height:        Intake/Output Summary (Last 24 hours) at 05/08/2022 1257 Last data filed at 05/08/2022 1100 Gross per 24 hour  Intake 1200 ml  Output 2600 ml  Net -1400 ml      05/02/2022    9:36 AM 04/29/2022    4:29 PM 04/25/2022    3:34 PM  Last 3 Weights  Weight (lbs) 190 lb 190  lb 189 lb  Weight (kg) 86.183 kg 86.183 kg 85.73 kg      Telemetry    No telemetry - Personally Reviewed  ECG    N/a - Personally Reviewed  Physical Exam   VS:  BP 105/81 (BP Location: Left Arm)   Pulse 62   Temp 98 F (36.7 C) (Oral)   Resp 20   Ht 6' (1.829 m)   Wt 86.2 kg   SpO2 97%   BMI 25.77 kg/m  , BMI Body mass index is 25.77 kg/m. GENERAL:  Well appearing HEENT: Pupils equal round and reactive, fundi not visualized, oral mucosa unremarkable NECK:  No jugular venous distention, waveform within normal limits, carotid upstroke brisk and symmetric, no bruits, no thyromegaly LUNGS:  Clear to auscultation bilaterally HEART:  RRR.  PMI not displaced or sustained,S1 and S2 within normal limits, no S3, no S4, no clicks, no rubs, no murmurs ABD:  Flat, positive bowel sounds normal in frequency in pitch, no bruits, no rebound, no guarding, no midline pulsatile mass, no hepatomegaly, no splenomegaly EXT:  2 plus  pulses throughout, no edema, no cyanosis no clubbing SKIN:  No rashes no nodules NEURO:  Cranial nerves II through XII grossly intact, motor grossly intact throughout PSYCH:  Oreinted to person and place and time   Labs    High Sensitivity Troponin:  No results for input(s): "TROPONINIHS" in the last 720 hours.   Chemistry Recent Labs  Lab 05/06/22 0547 05/07/22 0514 05/08/22 0616  NA 137 138 137  K 3.8 4.1 3.7  CL 101 101 101  CO2 '29 30 29  '$ GLUCOSE 213* 249* 142*  BUN 29* 29* 25*  CREATININE 1.00 1.01 0.95  CALCIUM 8.7* 9.2 9.1  GFRNONAA >60 >60 >60  ANIONGAP '7 7 7    '$ Lipids No results for input(s): "CHOL", "TRIG", "HDL", "LABVLDL", "LDLCALC", "CHOLHDL" in the last 168 hours.  Hematology Recent Labs  Lab 05/02/22 0539  WBC 6.5  RBC 4.14*  HGB 12.4*  HCT 37.6*  MCV 90.8  MCH 30.0  MCHC 33.0  RDW 14.1  PLT 232   Thyroid No results for input(s): "TSH", "FREET4" in the last 168 hours.  BNP Recent Labs  Lab 05/05/22 0319 05/06/22 0547  05/07/22 0514  BNP 435.5* 369.1* 266.5*    DDimer No results for input(s): "DDIMER" in the last 168 hours.   Radiology    No results found.  Cardiac Studies   Echo 05/04/22: 1. Inferior septal and apical hypokinesis . Left ventricular ejection  fraction, by estimation, is 45 to 50%. The left ventricle has mildly  decreased function. The left ventricle demonstrates regional wall motion  abnormalities (see scoring  diagram/findings for description). The left ventricular internal cavity  size was moderately dilated. There is moderate left ventricular  hypertrophy. Left ventricular diastolic parameters are indeterminate.   2. Right ventricular systolic function is normal. The right ventricular  size is normal.   3. Left atrial size was mild to moderately dilated.   4. The mitral valve is abnormal. Trivial mitral valve regurgitation. No  evidence of mitral stenosis.   5. The aortic valve is tricuspid. There is mild calcification of the  aortic valve. There is mild thickening of the aortic valve. Aortic valve  regurgitation is not visualized. Aortic valve sclerosis is present, with  no evidence of aortic valve stenosis.   6. The inferior vena cava is dilated in size with >50% respiratory  variability, suggesting right atrial pressure of 8 mmHg.   Patient Profile     76 y.o. male with Parkinson's disease and chronic LE edema admitted with acute systolic and diastolic heart failure in the setting of sepsis from a urinary source from ESBL.  Assessment & Plan    # Acute systolic diastolic heart failure: Patient is struggled with lower extremity edema chronically.  He now has new onset heart failure with LVEF 45 to 50%.  No clear ischemic symptoms.  He reports struggling with high doses of Lasix uses as an outpatient due to his Parkinson's and difficulty with ambulation.  We agreed to transition to lasix '30mg'$  bid.  He has good diuresis and renal function is stable.  He appears to be  euvolemic.  Continue metoprolol, losartan, and consider outpatient stress once medically stable.  Consider adding Jardiance or [place].  [facility] will sign off.   Medication Recommendations:  conntinue metoprolol and losartan.  BP too low for spironolactone.  Would add Jardiance or Wilder Glade per his insurance availability Other recommendations (labs, testing, etc):  outpatient stress Follow up as an outpatient:  we will arrange  For questions or updates, please contact [place] Please consult [email or web address] for contact info under        Signed, Skeet Latch, MD  05/08/2022, 12:57 PM

## 2022-05-08 NOTE — TOC Transition Note (Signed)
Transition of Care [facility]) - CM/SW Discharge Note   Patient Details  [name] MRN: [id number] Date of Birth: 02-25-1946  [facility] CM/SW Contact:  Vassie Moselle, LCSW Phone Number: 05/08/2022, 3:03 PM   Clinical Narrative:    Pt is to transfer to [facility] in [place]. Pt will be in room 102. Nurses to call report to [phone]. Pt's wife is to provide transportation for pt to facility.    Final next level of care: Skilled Nursing Facility Barriers to Discharge: Barriers Resolved   Patient Goals and CMS Choice Patient states their goals for this hospitalization and ongoing recovery are:: To go home   Choice offered to / list presented to : Patient  Discharge Placement   Existing PASRR number confirmed : 05/01/22          Patient chooses bed at: [facility], [place] Patient to be transferred to facility by: Wife Name of family member notified: Wife, [name] Patient and family notified of of transfer: 05/08/22  Discharge Plan and Services In-house Referral: Clinical Social Work Discharge Planning Services: CM Consult Post Acute Care Choice: [facility]          DME Arranged: N/A DME Agency: NA                  Social Determinants of Health (SDOH) Interventions     Readmission Risk Interventions    05/08/2022    3:02 PM 05/01/2022    2:36 PM  Readmission Risk Prevention Plan  Transportation Screening Complete Complete  PCP or Specialist Appt within 5-7 Days  Complete  PCP or Specialist Appt within 3-5 Days Complete   Home Care Screening  Complete  Medication Review (RN CM)  Complete  HRI or Home Care Consult Complete   Social Work Consult for [place] Planning/Counseling Complete   Palliative Care Screening Not Applicable   Medication Review [employer]) Complete

## 2022-05-08 NOTE — Care Management Important Message (Signed)
Important Message  Patient Details IM Letter given to the Patient. [name] MRN: [id number] Date of Birth: September 03, 1946   Medicare Important Message Given:  Yes     [name] 05/08/2022, 12:30 PM

## 2022-05-09 DIAGNOSIS — Z79899 Other long term (current) drug therapy: Secondary | ICD-10-CM | POA: Diagnosis not present

## 2022-05-09 DIAGNOSIS — R269 Unspecified abnormalities of gait and mobility: Secondary | ICD-10-CM | POA: Diagnosis not present

## 2022-05-09 DIAGNOSIS — I10 Essential (primary) hypertension: Secondary | ICD-10-CM | POA: Diagnosis not present

## 2022-05-09 DIAGNOSIS — E559 Vitamin D deficiency, unspecified: Secondary | ICD-10-CM | POA: Diagnosis not present

## 2022-05-09 DIAGNOSIS — E1169 Type 2 diabetes mellitus with other specified complication: Secondary | ICD-10-CM | POA: Diagnosis not present

## 2022-05-09 DIAGNOSIS — N390 Urinary tract infection, site not specified: Secondary | ICD-10-CM | POA: Diagnosis not present

## 2022-05-09 DIAGNOSIS — A419 Sepsis, unspecified organism: Secondary | ICD-10-CM | POA: Diagnosis not present

## 2022-05-09 DIAGNOSIS — R296 Repeated falls: Secondary | ICD-10-CM | POA: Diagnosis not present

## 2022-05-11 DIAGNOSIS — I10 Essential (primary) hypertension: Secondary | ICD-10-CM | POA: Diagnosis not present

## 2022-05-11 DIAGNOSIS — E1169 Type 2 diabetes mellitus with other specified complication: Secondary | ICD-10-CM | POA: Diagnosis not present

## 2022-05-11 DIAGNOSIS — N390 Urinary tract infection, site not specified: Secondary | ICD-10-CM | POA: Diagnosis not present

## 2022-05-11 DIAGNOSIS — R269 Unspecified abnormalities of gait and mobility: Secondary | ICD-10-CM | POA: Diagnosis not present

## 2022-05-11 DIAGNOSIS — R296 Repeated falls: Secondary | ICD-10-CM | POA: Diagnosis not present

## 2022-05-11 DIAGNOSIS — A419 Sepsis, unspecified organism: Secondary | ICD-10-CM | POA: Diagnosis not present

## 2022-05-18 DIAGNOSIS — R531 Weakness: Secondary | ICD-10-CM | POA: Diagnosis not present

## 2022-05-18 DIAGNOSIS — G20 Parkinson's disease: Secondary | ICD-10-CM | POA: Diagnosis not present

## 2022-05-18 DIAGNOSIS — E1165 Type 2 diabetes mellitus with hyperglycemia: Secondary | ICD-10-CM | POA: Diagnosis not present

## 2022-05-18 DIAGNOSIS — J309 Allergic rhinitis, unspecified: Secondary | ICD-10-CM | POA: Diagnosis not present

## 2022-05-18 DIAGNOSIS — S300XXA Contusion of lower back and pelvis, initial encounter: Secondary | ICD-10-CM | POA: Diagnosis not present

## 2022-05-18 DIAGNOSIS — Z8619 Personal history of other infectious and parasitic diseases: Secondary | ICD-10-CM | POA: Diagnosis not present

## 2022-05-18 DIAGNOSIS — R8290 Unspecified abnormal findings in urine: Secondary | ICD-10-CM | POA: Diagnosis not present

## 2022-05-21 DIAGNOSIS — I5041 Acute combined systolic (congestive) and diastolic (congestive) heart failure: Secondary | ICD-10-CM | POA: Diagnosis not present

## 2022-05-21 DIAGNOSIS — Z7984 Long term (current) use of oral hypoglycemic drugs: Secondary | ICD-10-CM | POA: Diagnosis not present

## 2022-05-21 DIAGNOSIS — H9110 Presbycusis, unspecified ear: Secondary | ICD-10-CM | POA: Diagnosis not present

## 2022-05-21 DIAGNOSIS — N1831 Chronic kidney disease, stage 3a: Secondary | ICD-10-CM | POA: Diagnosis not present

## 2022-05-21 DIAGNOSIS — R1313 Dysphagia, pharyngeal phase: Secondary | ICD-10-CM | POA: Diagnosis not present

## 2022-05-21 DIAGNOSIS — Z794 Long term (current) use of insulin: Secondary | ICD-10-CM | POA: Diagnosis not present

## 2022-05-21 DIAGNOSIS — I130 Hypertensive heart and chronic kidney disease with heart failure and stage 1 through stage 4 chronic kidney disease, or unspecified chronic kidney disease: Secondary | ICD-10-CM | POA: Diagnosis not present

## 2022-05-21 DIAGNOSIS — E7800 Pure hypercholesterolemia, unspecified: Secondary | ICD-10-CM | POA: Diagnosis not present

## 2022-05-21 DIAGNOSIS — Z1612 Extended spectrum beta lactamase (ESBL) resistance: Secondary | ICD-10-CM | POA: Diagnosis not present

## 2022-05-21 DIAGNOSIS — N200 Calculus of kidney: Secondary | ICD-10-CM | POA: Diagnosis not present

## 2022-05-21 DIAGNOSIS — N390 Urinary tract infection, site not specified: Secondary | ICD-10-CM | POA: Diagnosis not present

## 2022-05-21 DIAGNOSIS — J309 Allergic rhinitis, unspecified: Secondary | ICD-10-CM | POA: Diagnosis not present

## 2022-05-21 DIAGNOSIS — G20 Parkinson's disease: Secondary | ICD-10-CM | POA: Diagnosis not present

## 2022-05-21 DIAGNOSIS — K219 Gastro-esophageal reflux disease without esophagitis: Secondary | ICD-10-CM | POA: Diagnosis not present

## 2022-05-21 DIAGNOSIS — E559 Vitamin D deficiency, unspecified: Secondary | ICD-10-CM | POA: Diagnosis not present

## 2022-05-21 DIAGNOSIS — B351 Tinea unguium: Secondary | ICD-10-CM | POA: Diagnosis not present

## 2022-05-21 DIAGNOSIS — E119 Type 2 diabetes mellitus without complications: Secondary | ICD-10-CM | POA: Diagnosis not present

## 2022-05-21 DIAGNOSIS — M1990 Unspecified osteoarthritis, unspecified site: Secondary | ICD-10-CM | POA: Diagnosis not present

## 2022-05-21 NOTE — Progress Notes (Addendum)
Office Visit    Patient [name] Date of Encounter: 05/22/2022  PCP:  Josetta Huddle, MD   [place]  Cardiologist:  Candee Furbish, MD  Advanced Practice Provider:  No care team member to display Electrophysiologist:  None   HPI    [name] is a 76 y.o. male with past history significant for Parkinson's disease, insulin-dependent type 2 diabetes, hypertension, recurrent ESBL UTI, acute combined systolic and diastolic heart failure presents today for hospital follow-up.  The patient was recently in the ED 04/29/2022 with symptoms of general malaise and progressive weakness.  He was recently hospitalized 7/7 to 7/9 for the same treated with meropenem, has been weaker since then.  He was brought to the ED 7/16 with significant weakness, febrile to 101.4, UA abnormal again, chest x-ray was unremarkable, also followed by ID for recurrent ESBL UTIs.  He has a history of acute combined CHF echocardiogram showed EF 45 to 50% which is a decline from previous readings.  Cardiology was consulted at this time and was diuresed with IV Lasix, eventually transition to p.o.  Started on metoprolol, ARB, Jardiance.  Plans for an outpatient ischemic workup.  Today, he states that he has had multiple recurrent ESBL UTIs and has been on IV antibiotics multiple times this year.  He has an appointment with Dr. Gloriann Loan today.  He recently passed a kidney stone and fractured his back.  He had episode of dehydration and was hospitalized with IV fluids.  Ultimately, an echocardiogram was performed for his weakness which showed EF 45 to 50% which was a decline from previous readings.  He was diuresed using IV Lasix and was transitioned to p.o. on discharge.  Since discharge she has been doing fairly well.  He has not had any chest pain or shortness of breath but due to his decline in EF we will plan to do an ischemic workup with coronary CTA.  We also discussed possibly transitioning to  [facility] but will readdress this after his coronary CTA.  Reports no shortness of breath nor dyspnea on exertion. Reports no chest pain, pressure, or tightness. No edema, orthopnea, PND. Reports no palpitations.    Past Medical History    Past Medical History:  Diagnosis Date   Abnormal involuntary movement 02/18/2018   Allergic rhinitis    Arthritis    BPH (benign prostatic hyperplasia)    Diabetes ([place])    Dysphagia, pharyngeal phase    GERD diagnosed on barium swallow. Has small hiatal hernia. Symptomatically somewhat better on omeprazole but not entirely. We'll try b.i.d. therapy   Edema    1+ in both ankles, likely multifactorial including medication such as Requip   Erectile dysfunction    Staxyn 10 mg or Viagra worked well. 3 samples of Cialis 20 mg provided   GERD (gastroesophageal reflux disease)    Hypercholesteremia    Hypertension    Nephrolithiasis    Onychomycosis of toenail    April 27, 2013 - Dr. Inocencio Homes - podiatry, was in [place] - treating with oral Lamisil and topical nail therapy   Parkinson's disease [facility])     followed by Dr. Lezlie Octave at [facility] and [name], M.D. in [place]   Presbycusis    and tinnitus - Dr. Izora Gala - August/2013   Renal calculus    Syncope    Past Surgical History:  Procedure Laterality Date   HAND SURGERY     INGUINAL HERNIA REPAIR Left 11/04/2015  Procedure: LEFT INGUINAL HERNIA REPAIR WITH MESH;  Surgeon: Armandina Gemma, MD;  Location: [facility];  Service: General;  Laterality: Left;   INSERTION OF MESH Left 11/04/2015   Procedure: INSERTION OF MESH;  Surgeon: Armandina Gemma, MD;  Location: [facility];  Service: General;  Laterality: Left;   JOINT REPLACEMENT Bilateral    KNEE SURGERY     TOTAL KNEE ARTHROPLASTY      Allergies  Allergies  Allergen Reactions   Nucynta [Tapentadol] Other (See Comments)    Made the patient not feel well. "He did not feel well at all."   Oxycodone Itching    Sudafed [Pseudoephedrine Hcl] Other (See Comments)    Problems urinating    Tramadol Hcl Itching and Other (See Comments)    Can tolerate, however (in 2023)    EKGs/Labs/Other Studies Reviewed:   The following studies were reviewed today:  Echocardiogram 05/04/2022  IMPRESSIONS     1. Inferior septal and apical hypokinesis . Left ventricular ejection  fraction, by estimation, is 45 to 50%. The left ventricle has mildly  decreased function. The left ventricle demonstrates regional wall motion  abnormalities (see scoring  diagram/findings for description). The left ventricular internal cavity  size was moderately dilated. There is moderate left ventricular  hypertrophy. Left ventricular diastolic parameters are indeterminate.   2. Right ventricular systolic function is normal. The right ventricular  size is normal.   3. Left atrial size was mild to moderately dilated.   4. The mitral valve is abnormal. Trivial mitral valve regurgitation. No  evidence of mitral stenosis.   5. The aortic valve is tricuspid. There is mild calcification of the  aortic valve. There is mild thickening of the aortic valve. Aortic valve  regurgitation is not visualized. Aortic valve sclerosis is present, with  no evidence of aortic valve stenosis.   6. The inferior vena cava is dilated in size with >50% respiratory  variability, suggesting right atrial pressure of 8 mmHg.   FINDINGS   Left Ventricle: Inferior septal and apical hypokinesis. Left ventricular  ejection fraction, by estimation, is 45 to 50%. The left ventricle has  mildly decreased function. The left ventricle demonstrates regional wall  motion abnormalities. The left  ventricular internal cavity size was moderately dilated. There is moderate  left ventricular hypertrophy. Left ventricular diastolic parameters are  indeterminate.   Right Ventricle: The right ventricular size is normal. No increase in  right ventricular wall thickness.  Right ventricular systolic function is  normal.   Left Atrium: Left atrial size was mild to moderately dilated.   Right Atrium: Right atrial size was normal in size.   Pericardium: There is no evidence of pericardial effusion.   Mitral Valve: The mitral valve is abnormal. There is mild thickening of  the mitral valve leaflet(s). There is mild calcification of the mitral  valve leaflet(s). Trivial mitral valve regurgitation. No evidence of  mitral valve stenosis.   Tricuspid Valve: The tricuspid valve is normal in structure. Tricuspid  valve regurgitation is mild . No evidence of tricuspid stenosis.   Aortic Valve: The aortic valve is tricuspid. There is mild calcification  of the aortic valve. There is mild thickening of the aortic valve. Aortic  valve regurgitation is not visualized. Aortic valve sclerosis is present,  with no evidence of aortic valve  stenosis. Aortic valve peak gradient measures 8.6 mmHg.   Pulmonic Valve: The pulmonic valve was normal in structure. Pulmonic valve  regurgitation is not visualized. No  evidence of pulmonic stenosis.   Aorta: The aortic root is normal in size and structure.   Venous: The inferior vena cava is dilated in size with greater than 50%  respiratory variability, suggesting right atrial pressure of 8 mmHg.   IAS/Shunts: No atrial level shunt detected by color flow Doppler.    EKG:  EKG is not ordered today.  Recent Labs: 01/02/2022: Magnesium 2.0 04/29/2022: ALT 5 05/02/2022: Hemoglobin 12.4; Platelets 232 05/07/2022: B Natriuretic Peptide 266.5 05/08/2022: BUN 25; Creatinine, Ser 0.95; Potassium 3.7; Sodium 137  Recent Lipid Panel No results found for: "CHOL", "TRIG", "HDL", "CHOLHDL", "VLDL", "LDLCALC", "LDLDIRECT"  Home Medications   Current Meds  Medication Sig   acetaminophen (TYLENOL) 500 MG tablet Take 1,000 mg by mouth every 6 (six) hours as needed (for pain).   Ascorbic Acid (VITAMIN C) 100 MG tablet Take 100 mg by mouth  in the morning and at bedtime.   calcium carbonate (TUMS) 500 MG chewable tablet Chew 2 tablets by mouth daily as needed for indigestion or heartburn.   carbidopa-levodopa (SINEMET IR) 25-100 MG tablet Take 3 tablets by mouth 4 (four) times daily. (Patient taking differently: Take 3 tablets by mouth See admin instructions. Take 3 tablets by mouth at 6 AM, 9 AM, 12 NOON, 3 PM, and 6 PM, but can sometimes average only a sum total of 13 tablets/day)   cholecalciferol (VITAMIN D) 1000 UNITS tablet Take 2,000 Units by mouth daily.   clotrimazole (LOTRIMIN AF) 1 % cream Apply 1 application. topically 2 (two) times daily. (Patient taking differently: Apply 1 application  topically 2 (two) times daily as needed (for yeast infections resulting from taking antibiotics).)   empagliflozin (JARDIANCE) 25 MG TABS tablet Take 1 tablet (25 mg total) by mouth daily before breakfast.   entacapone (COMTAN) 200 MG tablet TAKE 1 TABLET BY MOUTH 3 TIMES DAILY (WITH FIRST THREE DOSAGES OF CARBIDOPA/LEVODOPA). (Patient taking differently: Take 200 mg by mouth See admin instructions. Take 200 mg by mouth at 6 AM, 9 AM, and 12 NOON)   furosemide (LASIX) 20 MG tablet Take 1.5 tablets (30 mg total) by mouth 2 (two) times daily.   GLOBAL EASE INJECT PEN NEEDLES 31G X 5 MM MISC Inject into the skin.   hydrocortisone cream 0.5 % Apply 1 application. topically 2 (two) times daily. (Patient taking differently: Apply 1 application  topically 2 (two) times daily as needed for itching.)   LANTUS SOLOSTAR 100 UNIT/ML Solostar Pen Inject 15-20 Units into the skin at bedtime.   losartan (COZAAR) 25 MG tablet Take 0.5 tablets (12.5 mg total) by mouth daily.   metFORMIN (GLUCOPHAGE) 500 MG tablet Take 500 mg by mouth 2 (two) times daily with a meal.   methenamine (MANDELAMINE) 1 g tablet Take 1 tablet (1,000 mg total) by mouth 2 (two) times daily. Take with any vitamin c, twice a day for uti prevention (Patient taking differently: Take 1,000  mg by mouth in the morning and at bedtime.)   metoprolol succinate (TOPROL-XL) 25 MG 24 hr tablet Take 1 tablet (25 mg total) by mouth daily.   omeprazole (PRILOSEC) 20 MG capsule Take 20 mg by mouth daily as needed (acid reflux).   potassium chloride SA (KLOR-CON) 20 MEQ tablet Take 10 mEq by mouth 2 (two) times daily.   rOPINIRole (REQUIP) 3 MG tablet Take 1 tablet in the morning, Take  1 in the afternoon, Take a  half tablet in the evening (Patient taking differently: Take 1.5-3 mg by mouth  See admin instructions. Take 3 mg by mouth in the morning and afternoon. Take 1.5 mg in the evening.)   tamsulosin (FLOMAX) 0.4 MG CAPS capsule Take 0.4 mg by mouth in the morning and at bedtime.   traMADol (ULTRAM) 50 MG tablet Take 1 tablet (50 mg total) by mouth every 6 (six) hours as needed (for pain).   triamcinolone (NASACORT) 55 MCG/ACT AERO nasal inhaler Place 2 sprays into the nose daily as needed (for allergies or rhinitis).   vitamin B-12 (CYANOCOBALAMIN) 1000 MCG tablet Take 1,000 mcg by mouth daily.     Review of Systems      All other systems reviewed and are otherwise negative except as noted above.  Physical Exam    VS:  BP 124/70   Pulse 61   Ht 6' (1.829 m)   Wt 183 lb (83 kg)   SpO2 94%   BMI 24.82 kg/m  , BMI Body mass index is 24.82 kg/m.  Wt Readings from Last 3 Encounters:  05/22/22 183 lb (83 kg)  05/02/22 190 lb (86.2 kg)  04/25/22 189 lb (85.7 kg)     GEN: Well nourished, well developed, in no acute distress. HEENT: normal. Neck: Supple, no JVD, carotid bruits, or masses. Cardiac: RRR, no murmurs, rubs, or gallops. No clubbing, cyanosis, 2 + pitting edema on left, 1+ edema on right (chronic).  Radials/PT 2+ and equal bilaterally.  Respiratory:  Respirations regular and unlabored, clear to auscultation bilaterally. GI: Soft, nontender, nondistended. MS: No deformity or atrophy. Skin: Warm and dry, no rash. Neuro:  Strength and sensation are intact. Psych: Normal  affect.  Assessment & Plan    HFmrEF -Echocardiogram with EF 45 to 50% which has declined from previous studies -Diuresis with IV Lasix and eventually transition to p.o. for discharge -Started on Toprol, ARB, Jardiance -Might be a good Entresto candidate but will plan ischemic workup first -He is only taking Lasix 20 mg once a day but will change to twice a day -coronary  CTA to further evaluate decline in EF -weight is down 10-12 lbs since the first of the year but lower ext edema is about the same.  -Will obtain BMP from PCP since labs recently done -daily weights  HTN -currently on Losartan and Toprol XL and well controlled  Diabetes mellitus -continue current medication regimen -A1C 6.4  Recurrent ESBL UTI -per primary, this is his main concern -He has an appointment with Dr. Gloriann Loan and follows ID outpatient     CT results reviewed with Dr. Marlou Porch and recommendations were as follows:  Based upon CT results, lets start aspirin 81 mg.  Lets start Crestor 10 mg once a day for plaque stabilization.  With his mildly reduced ejection fraction of 45 to 50%, continue with Toprol as well as losartan and Jardiance.  Blood pressures have been soft.  Continue with current low-dose losartan and Toprol.  Since he is currently asymptomatic without any anginal symptoms, lets hold off on cardiac catheterization.  Lets continue with optimization of medical therapy.  Repeat echocardiogram in 6 months.    Disposition: Follow up after CT coronary with Candee Furbish, MD or APP.  Signed, Elgie Collard, PA-C 05/22/2022, 12:05 PM [address]

## 2022-05-22 ENCOUNTER — Ambulatory Visit (INDEPENDENT_AMBULATORY_CARE_PROVIDER_SITE_OTHER): Payer: Medicare Other | Admitting: Physician Assistant

## 2022-05-22 ENCOUNTER — Encounter: Payer: Self-pay | Admitting: Physician Assistant

## 2022-05-22 VITALS — BP 124/70 | HR 61 | Ht 72.0 in | Wt 183.0 lb

## 2022-05-22 DIAGNOSIS — Z1612 Extended spectrum beta lactamase (ESBL) resistance: Secondary | ICD-10-CM | POA: Diagnosis not present

## 2022-05-22 DIAGNOSIS — E119 Type 2 diabetes mellitus without complications: Secondary | ICD-10-CM

## 2022-05-22 DIAGNOSIS — I5020 Unspecified systolic (congestive) heart failure: Secondary | ICD-10-CM | POA: Diagnosis not present

## 2022-05-22 DIAGNOSIS — I10 Essential (primary) hypertension: Secondary | ICD-10-CM

## 2022-05-22 DIAGNOSIS — N390 Urinary tract infection, site not specified: Secondary | ICD-10-CM

## 2022-05-22 DIAGNOSIS — B9629 Other Escherichia coli [E. coli] as the cause of diseases classified elsewhere: Secondary | ICD-10-CM

## 2022-05-22 DIAGNOSIS — N200 Calculus of kidney: Secondary | ICD-10-CM | POA: Diagnosis not present

## 2022-05-22 DIAGNOSIS — N3020 Other chronic cystitis without hematuria: Secondary | ICD-10-CM | POA: Diagnosis not present

## 2022-05-22 MED ORDER — FUROSEMIDE 20 MG PO TABS: 20.0000 mg | ORAL_TABLET | Freq: Two times a day (BID) | ORAL | 3 refills | Status: DC

## 2022-05-22 NOTE — Patient Instructions (Signed)
Medication Instructions:  1.Take furosemide (Lasix) 20 mg twice a day *If you need a refill on your cardiac medications before your next appointment, please call your pharmacy*   Lab Work: None If you have labs (blood work) drawn today and your tests are completely normal, you will receive your results only by: [facility] (if you have MyChart) OR A paper copy in the mail If you have any lab test that is abnormal or we need to change your treatment, we will call you to review the results.   Testing/Procedures:   Your cardiac CT will be scheduled at one of the below locations:   [facility] [address] [place], [place] [address] [phone]  [facility] [address] [place], [place] [address] [phone]  If scheduled at [facility], please arrive at the [facility] and Children's Entrance (Entrance C2) of [facility] 30 minutes prior to test start time. You can use the FREE valet parking offered at entrance C (encouraged to control the heart rate for the test)  Proceed to the [facility] [place] Radiology Department (first floor) to check-in and test prep.  All radiology patients and guests should use entrance C2 at [facility], accessed from [facility], even though the hospital's physical address listed is [address].    If scheduled at [facility], please arrive 15 mins early for check-in and test prep.  Please follow these instructions carefully (unless otherwise directed):  Hold all erectile dysfunction medications at least 3 days (72 hrs) prior to test.  On the Night Before the Test: Be sure to Drink plenty of water. Do not consume any caffeinated/decaffeinated beverages or chocolate 12 hours prior to your test. Do not take any antihistamines 12 hours prior to your test. If the patient has contrast allergy: Patient will need a  prescription for Prednisone and very clear instructions (as follows): Prednisone 50 mg - take 13 hours prior to test Take another Prednisone 50 mg 7 hours prior to test Take another Prednisone 50 mg 1 hour prior to test Take Benadryl 50 mg 1 hour prior to test Patient must complete all four doses of above prophylactic medications. Patient will need a ride after test due to Benadryl.  On the Day of the Test: Drink plenty of water until 1 hour prior to the test. Do not eat any food 4 hours prior to the test. You may take your regular medications prior to the test.  Take metoprolol 25 mg two hours prior to test. HOLD Furosemide/Hydrochlorothiazide morning of the test.        After the Test: Drink plenty of water. After receiving IV contrast, you may experience a mild flushed feeling. This is normal. On occasion, you may experience a mild rash up to 24 hours after the test. This is not dangerous. If this occurs, you can take Benadryl 25 mg and increase your fluid intake. If you experience trouble breathing, this can be serious. If it is severe call 911 IMMEDIATELY. If it is mild, please call our office. If you take any of these medications: Glipizide/Metformin, Avandament, Glucavance, please do not take 48 hours after completing test unless otherwise instructed.  We will call to schedule your test 2-4 weeks out understanding that some insurance companies will need an authorization prior to the service being performed.   For non-scheduling related questions, please contact the cardiac imaging nurse navigator should you have  any questions/concerns: Marchia Bond, Cardiac Imaging Nurse Navigator Gordy Clement, Cardiac Imaging Nurse Navigator [facility] Direct Office Dial: [phone]   For scheduling needs, including cancellations and rescheduling, please call [place], [phone].   Follow-Up: At [facility], you and your health needs are our priority.   As part of our continuing mission to provide you with exceptional heart care, we have created designated Provider Care Teams.  These Care Teams include your primary Cardiologist (physician) and Advanced Practice Providers (APPs -  Physician Assistants and Nurse Practitioners) who all work together to provide you with the care you need, when you need it.  We recommend signing up for the patient portal called "MyChart".  Sign up information is provided on this After Visit Summary.  MyChart is used to connect with patients for Virtual Visits (Telemedicine).  Patients are able to view lab/test results, encounter notes, upcoming appointments, etc.  Non-urgent messages can be sent to your provider as well.   To learn more about what you can do with MyChart, go to [email or web address].    Your next appointment:   1 month(s)  The format for your next appointment:   In Person  Provider:   Candee Furbish, MD or APP  Important Information About Sugar

## 2022-05-24 DIAGNOSIS — R1313 Dysphagia, pharyngeal phase: Secondary | ICD-10-CM | POA: Diagnosis not present

## 2022-05-24 DIAGNOSIS — N390 Urinary tract infection, site not specified: Secondary | ICD-10-CM | POA: Diagnosis not present

## 2022-05-24 DIAGNOSIS — E559 Vitamin D deficiency, unspecified: Secondary | ICD-10-CM | POA: Diagnosis not present

## 2022-05-24 DIAGNOSIS — Z794 Long term (current) use of insulin: Secondary | ICD-10-CM | POA: Diagnosis not present

## 2022-05-24 DIAGNOSIS — G20 Parkinson's disease: Secondary | ICD-10-CM | POA: Diagnosis not present

## 2022-05-24 DIAGNOSIS — Z7984 Long term (current) use of oral hypoglycemic drugs: Secondary | ICD-10-CM | POA: Diagnosis not present

## 2022-05-24 DIAGNOSIS — I130 Hypertensive heart and chronic kidney disease with heart failure and stage 1 through stage 4 chronic kidney disease, or unspecified chronic kidney disease: Secondary | ICD-10-CM | POA: Diagnosis not present

## 2022-05-24 DIAGNOSIS — N200 Calculus of kidney: Secondary | ICD-10-CM | POA: Diagnosis not present

## 2022-05-24 DIAGNOSIS — E7800 Pure hypercholesterolemia, unspecified: Secondary | ICD-10-CM | POA: Diagnosis not present

## 2022-05-24 DIAGNOSIS — K219 Gastro-esophageal reflux disease without esophagitis: Secondary | ICD-10-CM | POA: Diagnosis not present

## 2022-05-24 DIAGNOSIS — M1990 Unspecified osteoarthritis, unspecified site: Secondary | ICD-10-CM | POA: Diagnosis not present

## 2022-05-24 DIAGNOSIS — E119 Type 2 diabetes mellitus without complications: Secondary | ICD-10-CM | POA: Diagnosis not present

## 2022-05-24 DIAGNOSIS — Z1612 Extended spectrum beta lactamase (ESBL) resistance: Secondary | ICD-10-CM | POA: Diagnosis not present

## 2022-05-24 DIAGNOSIS — J309 Allergic rhinitis, unspecified: Secondary | ICD-10-CM | POA: Diagnosis not present

## 2022-05-24 DIAGNOSIS — I5041 Acute combined systolic (congestive) and diastolic (congestive) heart failure: Secondary | ICD-10-CM | POA: Diagnosis not present

## 2022-05-24 DIAGNOSIS — H9110 Presbycusis, unspecified ear: Secondary | ICD-10-CM | POA: Diagnosis not present

## 2022-05-24 DIAGNOSIS — N1831 Chronic kidney disease, stage 3a: Secondary | ICD-10-CM | POA: Diagnosis not present

## 2022-05-24 DIAGNOSIS — B351 Tinea unguium: Secondary | ICD-10-CM | POA: Diagnosis not present

## 2022-05-29 ENCOUNTER — Encounter: Payer: Self-pay | Admitting: Internal Medicine

## 2022-05-29 ENCOUNTER — Ambulatory Visit (INDEPENDENT_AMBULATORY_CARE_PROVIDER_SITE_OTHER): Payer: Medicare Other | Admitting: Internal Medicine

## 2022-05-29 ENCOUNTER — Other Ambulatory Visit: Payer: Self-pay

## 2022-05-29 VITALS — BP 106/69 | HR 59 | Temp 97.7°F | Wt 186.0 lb

## 2022-05-29 DIAGNOSIS — K8020 Calculus of gallbladder without cholecystitis without obstruction: Secondary | ICD-10-CM | POA: Diagnosis not present

## 2022-05-29 DIAGNOSIS — Z8619 Personal history of other infectious and parasitic diseases: Secondary | ICD-10-CM

## 2022-05-29 DIAGNOSIS — N390 Urinary tract infection, site not specified: Secondary | ICD-10-CM

## 2022-05-29 DIAGNOSIS — I700 Atherosclerosis of aorta: Secondary | ICD-10-CM | POA: Diagnosis not present

## 2022-05-29 DIAGNOSIS — N200 Calculus of kidney: Secondary | ICD-10-CM | POA: Diagnosis not present

## 2022-05-29 DIAGNOSIS — G20 Parkinson's disease: Secondary | ICD-10-CM | POA: Diagnosis not present

## 2022-05-29 DIAGNOSIS — M4856XA Collapsed vertebra, not elsewhere classified, lumbar region, initial encounter for fracture: Secondary | ICD-10-CM | POA: Diagnosis not present

## 2022-05-29 NOTE — Progress Notes (Signed)
[facility] for Infectious Disease  CHIEF COMPLAINT:    Follow up for recurrent UTI  SUBJECTIVE:    [name] is a 76 y.o. male with PMHx as below who presents to the clinic for recurrent UTI.   Patient has followed primarily with [name] for this issue.  Recently admitted again in mid July and seen by [name] as an inpatient for a couple days.  He was treated for another ESBL UTI with meropenem and subsequently discharged on 7/25.  He presents today for follow up with his wife.  Reports since discharge has been doing okay.  He saw [name] (urology) last week and reports he passed a kidney stone.  He is going back to [name] later today for further imaging to see if there are more stones and they may consider a procedure to further deal with any residual stones.  His wife today reports that his urine has been more cloudy but that he is not drinking enough fluids and he is also on Lasix for fluid management.  She reports the past 5-6 days he has been more tired.  There have been no fevers or other infectious symptoms typically associated with UTI although his symptoms are usually atypical.  He saw PCP on 8/4 where a urine culture that I can see in [facility] portal showed another Kleb pna.  This isolate however is more sensitive to previous isolates including Cipro, Levaquin, Macrobid.  They also report his platelets were a little high but I do not have access to his CBC.   Please see A&P for the details of today's visit and status of the patient's medical problems.   Patient's Medications  New Prescriptions   No medications on file  Previous Medications   ACETAMINOPHEN (TYLENOL) 500 MG TABLET    Take 1,000 mg by mouth every 6 (six) hours as needed (for pain).   ASCORBIC ACID (VITAMIN C) 100 MG TABLET    Take 100 mg by mouth in the morning and at bedtime.   CALCIUM CARBONATE (TUMS) 500 MG CHEWABLE TABLET    Chew 2 tablets by mouth daily as needed for indigestion or heartburn.    CARBIDOPA-LEVODOPA (SINEMET IR) 25-100 MG TABLET    Take 3 tablets by mouth 4 (four) times daily.   CHOLECALCIFEROL (VITAMIN D) 1000 UNITS TABLET    Take 2,000 Units by mouth daily.   CLOTRIMAZOLE (LOTRIMIN AF) 1 % CREAM    Apply 1 application. topically 2 (two) times daily.   EMPAGLIFLOZIN (JARDIANCE) 25 MG TABS TABLET    Take 1 tablet (25 mg total) by mouth daily before breakfast.   ENTACAPONE (COMTAN) 200 MG TABLET    TAKE 1 TABLET BY MOUTH 3 TIMES DAILY (WITH FIRST THREE DOSAGES OF CARBIDOPA/LEVODOPA).   FUROSEMIDE (LASIX) 20 MG TABLET    Take 1 tablet (20 mg total) by mouth 2 (two) times daily.   GLOBAL EASE INJECT PEN NEEDLES 31G X 5 MM MISC    Inject into the skin.   HYDROCORTISONE CREAM 0.5 %    Apply 1 application. topically 2 (two) times daily.   LANTUS SOLOSTAR 100 UNIT/ML SOLOSTAR PEN    Inject 15-20 Units into the skin at bedtime.   LOSARTAN (COZAAR) 25 MG TABLET    Take 0.5 tablets (12.5 mg total) by mouth daily.   METFORMIN (GLUCOPHAGE) 500 MG TABLET    Take 500 mg by mouth 2 (two) times daily with a  meal.   METHENAMINE (MANDELAMINE) 1 G TABLET    Take 1 tablet (1,000 mg total) by mouth 2 (two) times daily. Take with any vitamin c, twice a day for uti prevention   METOPROLOL SUCCINATE (TOPROL-XL) 25 MG 24 HR TABLET    Take 1 tablet (25 mg total) by mouth daily.   OMEPRAZOLE (PRILOSEC) 20 MG CAPSULE    Take 20 mg by mouth daily as needed (acid reflux).   POTASSIUM CHLORIDE SA (KLOR-CON) 20 MEQ TABLET    Take 10 mEq by mouth 2 (two) times daily.   QUETIAPINE (SEROQUEL) 25 MG TABLET    Take 1 tablet (25 mg total) by mouth at bedtime.   ROPINIROLE (REQUIP) 3 MG TABLET    Take 1 tablet in the morning, Take  1 in the afternoon, Take a  half tablet in the evening   TAMSULOSIN (FLOMAX) 0.4 MG CAPS CAPSULE    Take 0.4 mg by mouth in the morning and at bedtime.   TRAMADOL (ULTRAM) 50 MG TABLET    Take 1 tablet (50 mg total) by mouth every 6 (six) hours as needed (for pain).   TRIAMCINOLONE  (NASACORT) 55 MCG/ACT AERO NASAL INHALER    Place 2 sprays into the nose daily as needed (for allergies or rhinitis).   VITAMIN B-12 (CYANOCOBALAMIN) 1000 MCG TABLET    Take 1,000 mcg by mouth daily.  Modified Medications   No medications on file  Discontinued Medications   No medications on file      Past Medical History:  Diagnosis Date   Abnormal involuntary movement 02/18/2018   Allergic rhinitis    Arthritis    BPH (benign prostatic hyperplasia)    Diabetes ([place])    Dysphagia, pharyngeal phase    GERD diagnosed on barium swallow. Has small hiatal hernia. Symptomatically somewhat better on omeprazole but not entirely. We'll try b.i.d. therapy   Edema    1+ in both ankles, likely multifactorial including medication such as Requip   Erectile dysfunction    Staxyn 10 mg or Viagra worked well. 3 samples of Cialis 20 mg provided   GERD (gastroesophageal reflux disease)    Hypercholesteremia    Hypertension    Nephrolithiasis    Onychomycosis of toenail    April 27, 2013 - Dr. Inocencio Homes - podiatry, was in [place] - treating with oral Lamisil and topical nail therapy   Parkinson's disease [facility])     followed by Dr. Lezlie Octave at [facility] and [name], M.D. in [place]   Presbycusis    and tinnitus - Dr. Izora Gala - August/2013   Renal calculus    Syncope     Social History   Tobacco Use   Smoking status: Never   Smokeless tobacco: Never  Vaping Use   Vaping Use: Never used  Substance Use Topics   Alcohol use: No   Drug use: No    Family History  Problem Relation Age of Onset   Atrial fibrillation Mother    Breast cancer Mother    Emphysema Father    Heart disease Father    Cancer Brother        African Burkitt    Allergies  Allergen Reactions   Nucynta [Tapentadol] Other (See Comments)    Made the patient not feel well. "He did not feel well at all."   Oxycodone Itching   Sudafed [Pseudoephedrine Hcl] Other (See Comments)    Problems urinating     Tramadol Hcl Itching and Other (See  Comments)    Can tolerate, however (in 2023)    Review of Systems  All other systems reviewed and are negative.  Except as noted above.   OBJECTIVE:    Vitals:   05/29/22 1336  BP: 106/69  Pulse: (!) 59  Temp: 97.7 F (36.5 C)  TempSrc: Oral  Weight: 186 lb (84.4 kg)   Body mass index is 25.23 kg/m.  Physical Exam Constitutional:      General: He is not in acute distress.    Appearance: Normal appearance.  HENT:     Head: Normocephalic and atraumatic.  Eyes:     Extraocular Movements: Extraocular movements intact.     Conjunctiva/sclera: Conjunctivae normal.  Pulmonary:     Effort: Pulmonary effort is normal. No respiratory distress.  Abdominal:     General: There is no distension.     Palpations: Abdomen is soft.  Musculoskeletal:     Comments: He is ambulating with a cane.   Skin:    General: Skin is warm and dry.  Neurological:     General: No focal deficit present.     Mental Status: He is alert and oriented to person, place, and time.  Psychiatric:        Mood and Affect: Mood normal.        Behavior: Behavior normal.      Labs and Microbiology:    Latest Ref Rng & Units 05/02/2022    5:39 AM 04/30/2022    4:40 AM 04/29/2022    4:40 PM  CBC  WBC 4.0 - 10.5 K/uL 6.5  10.1  9.8   Hemoglobin 13.0 - 17.0 g/dL 12.4  12.2  13.3   Hematocrit 39.0 - 52.0 % 37.6  36.5  40.8   Platelets 150 - 400 K/uL 232  222  245       Latest Ref Rng & Units 05/08/2022    6:16 AM 05/07/2022    5:14 AM 05/06/2022    5:47 AM  CMP  Glucose 70 - 99 mg/dL 142  249  213   BUN 8 - 23 mg/dL '25  29  29   '$ Creatinine 0.61 - 1.24 mg/dL 0.95  1.01  1.00   Sodium 135 - 145 mmol/L 137  138  137   Potassium 3.5 - 5.1 mmol/L 3.7  4.1  3.8   Chloride 98 - 111 mmol/L 101  101  101   CO2 22 - 32 mmol/L '29  30  29   '$ Calcium 8.9 - 10.3 mg/dL 9.1  9.2  8.7        ASSESSMENT & PLAN:    1. Urinary tract infection vs colonization  2. History of  ESBL Klebsiella pneumoniae infection  3. Parkinson disease ([place])   Unfortunately, this issue has required several admissions over the past 6 months and there is no great antibiotic solution available long term for this dilemma.  We have been trying to restrain unnecessary use of antibiotics although this is challenging as his symptoms are usually difficult to discern true infection vs asymptomatic bacteruria.    His current symptoms today again are difficult.  His urine is cloudy but they report not drinking enough and he is on Lasix so this could be more related to dehydration.  I advised to follow him closely over the next few days and not start antibiotics right now.  If he worsens then we could try sending in oral fosfomycin as an option.  Based on his most recent urine culture,  oral FQ may also be an option, too.  If he spikes fevers, then I think he would need to proceed to the ED.  Since he is otherwise doing okay, I don't think we need further blood work today so will defer this for now.    If he ends up needing a stone procedure in the near future, he'll need perioperative carbapenem most likely.  Will have him follow up with [name] in a couple weeks to reassess and ensure doing okay and see what further updates there are from urology.      Raynelle Highland for Infectious Disease [facility] 05/29/2022, 2:10 PM   I have personally spent 40 minutes involved in face-to-face and non-face-to-face activities for this patient on the day of the visit. Professional time spent includes the following activities: Preparing to see the patient (review of tests), Obtaining and/or reviewing separately obtained history (admission/discharge record), Performing a medically appropriate examination and/or evaluation , Ordering medications/tests/procedures, referring and communicating with other health care professionals, Documenting clinical information in the EMR, Independently  interpreting results (not separately reported), Communicating results to the patient/family/caregiver, Counseling and educating the patient/family/caregiver and Care coordination (not separately reported).

## 2022-05-30 NOTE — Progress Notes (Signed)
Thanks Andrew!

## 2022-05-31 DIAGNOSIS — Z794 Long term (current) use of insulin: Secondary | ICD-10-CM | POA: Diagnosis not present

## 2022-05-31 DIAGNOSIS — Z1612 Extended spectrum beta lactamase (ESBL) resistance: Secondary | ICD-10-CM | POA: Diagnosis not present

## 2022-05-31 DIAGNOSIS — I130 Hypertensive heart and chronic kidney disease with heart failure and stage 1 through stage 4 chronic kidney disease, or unspecified chronic kidney disease: Secondary | ICD-10-CM | POA: Diagnosis not present

## 2022-05-31 DIAGNOSIS — Z7984 Long term (current) use of oral hypoglycemic drugs: Secondary | ICD-10-CM | POA: Diagnosis not present

## 2022-05-31 DIAGNOSIS — E7800 Pure hypercholesterolemia, unspecified: Secondary | ICD-10-CM | POA: Diagnosis not present

## 2022-05-31 DIAGNOSIS — J309 Allergic rhinitis, unspecified: Secondary | ICD-10-CM | POA: Diagnosis not present

## 2022-05-31 DIAGNOSIS — N1831 Chronic kidney disease, stage 3a: Secondary | ICD-10-CM | POA: Diagnosis not present

## 2022-05-31 DIAGNOSIS — H9110 Presbycusis, unspecified ear: Secondary | ICD-10-CM | POA: Diagnosis not present

## 2022-05-31 DIAGNOSIS — I5041 Acute combined systolic (congestive) and diastolic (congestive) heart failure: Secondary | ICD-10-CM | POA: Diagnosis not present

## 2022-05-31 DIAGNOSIS — E559 Vitamin D deficiency, unspecified: Secondary | ICD-10-CM | POA: Diagnosis not present

## 2022-05-31 DIAGNOSIS — N390 Urinary tract infection, site not specified: Secondary | ICD-10-CM | POA: Diagnosis not present

## 2022-05-31 DIAGNOSIS — N200 Calculus of kidney: Secondary | ICD-10-CM | POA: Diagnosis not present

## 2022-05-31 DIAGNOSIS — M1990 Unspecified osteoarthritis, unspecified site: Secondary | ICD-10-CM | POA: Diagnosis not present

## 2022-05-31 DIAGNOSIS — E119 Type 2 diabetes mellitus without complications: Secondary | ICD-10-CM | POA: Diagnosis not present

## 2022-05-31 DIAGNOSIS — B351 Tinea unguium: Secondary | ICD-10-CM | POA: Diagnosis not present

## 2022-05-31 DIAGNOSIS — G20 Parkinson's disease: Secondary | ICD-10-CM | POA: Diagnosis not present

## 2022-05-31 DIAGNOSIS — K219 Gastro-esophageal reflux disease without esophagitis: Secondary | ICD-10-CM | POA: Diagnosis not present

## 2022-05-31 DIAGNOSIS — R1313 Dysphagia, pharyngeal phase: Secondary | ICD-10-CM | POA: Diagnosis not present

## 2022-06-06 DIAGNOSIS — H9110 Presbycusis, unspecified ear: Secondary | ICD-10-CM | POA: Diagnosis not present

## 2022-06-06 DIAGNOSIS — N390 Urinary tract infection, site not specified: Secondary | ICD-10-CM | POA: Diagnosis not present

## 2022-06-06 DIAGNOSIS — I5041 Acute combined systolic (congestive) and diastolic (congestive) heart failure: Secondary | ICD-10-CM | POA: Diagnosis not present

## 2022-06-06 DIAGNOSIS — E559 Vitamin D deficiency, unspecified: Secondary | ICD-10-CM | POA: Diagnosis not present

## 2022-06-06 DIAGNOSIS — G20 Parkinson's disease: Secondary | ICD-10-CM | POA: Diagnosis not present

## 2022-06-06 DIAGNOSIS — N1831 Chronic kidney disease, stage 3a: Secondary | ICD-10-CM | POA: Diagnosis not present

## 2022-06-06 DIAGNOSIS — Z794 Long term (current) use of insulin: Secondary | ICD-10-CM | POA: Diagnosis not present

## 2022-06-06 DIAGNOSIS — B351 Tinea unguium: Secondary | ICD-10-CM | POA: Diagnosis not present

## 2022-06-06 DIAGNOSIS — K219 Gastro-esophageal reflux disease without esophagitis: Secondary | ICD-10-CM | POA: Diagnosis not present

## 2022-06-06 DIAGNOSIS — E119 Type 2 diabetes mellitus without complications: Secondary | ICD-10-CM | POA: Diagnosis not present

## 2022-06-06 DIAGNOSIS — R1313 Dysphagia, pharyngeal phase: Secondary | ICD-10-CM | POA: Diagnosis not present

## 2022-06-06 DIAGNOSIS — Z1612 Extended spectrum beta lactamase (ESBL) resistance: Secondary | ICD-10-CM | POA: Diagnosis not present

## 2022-06-06 DIAGNOSIS — N200 Calculus of kidney: Secondary | ICD-10-CM | POA: Diagnosis not present

## 2022-06-06 DIAGNOSIS — Z7984 Long term (current) use of oral hypoglycemic drugs: Secondary | ICD-10-CM | POA: Diagnosis not present

## 2022-06-06 DIAGNOSIS — I130 Hypertensive heart and chronic kidney disease with heart failure and stage 1 through stage 4 chronic kidney disease, or unspecified chronic kidney disease: Secondary | ICD-10-CM | POA: Diagnosis not present

## 2022-06-06 DIAGNOSIS — M1990 Unspecified osteoarthritis, unspecified site: Secondary | ICD-10-CM | POA: Diagnosis not present

## 2022-06-06 DIAGNOSIS — J309 Allergic rhinitis, unspecified: Secondary | ICD-10-CM | POA: Diagnosis not present

## 2022-06-06 DIAGNOSIS — E7800 Pure hypercholesterolemia, unspecified: Secondary | ICD-10-CM | POA: Diagnosis not present

## 2022-06-07 DIAGNOSIS — S32018A Other fracture of first lumbar vertebra, initial encounter for closed fracture: Secondary | ICD-10-CM | POA: Diagnosis not present

## 2022-06-07 DIAGNOSIS — S32010D Wedge compression fracture of first lumbar vertebra, subsequent encounter for fracture with routine healing: Secondary | ICD-10-CM | POA: Diagnosis not present

## 2022-06-13 ENCOUNTER — Other Ambulatory Visit: Payer: Self-pay

## 2022-06-13 ENCOUNTER — Ambulatory Visit: Payer: Medicare Other | Admitting: Internal Medicine

## 2022-06-13 ENCOUNTER — Telehealth (HOSPITAL_COMMUNITY): Payer: Self-pay | Admitting: *Deleted

## 2022-06-13 ENCOUNTER — Telehealth: Payer: Self-pay | Admitting: Cardiology

## 2022-06-13 ENCOUNTER — Encounter: Payer: Self-pay | Admitting: Internal Medicine

## 2022-06-13 VITALS — BP 126/77 | HR 60 | Temp 97.7°F | Wt 185.0 lb

## 2022-06-13 DIAGNOSIS — N390 Urinary tract infection, site not specified: Secondary | ICD-10-CM

## 2022-06-13 DIAGNOSIS — G20 Parkinson's disease: Secondary | ICD-10-CM | POA: Diagnosis not present

## 2022-06-13 DIAGNOSIS — I509 Heart failure, unspecified: Secondary | ICD-10-CM | POA: Diagnosis not present

## 2022-06-13 NOTE — Telephone Encounter (Signed)
Pt c/o medication issue:  1. Name of Medication:   Prednisone  2. How are you currently taking this medication (dosage and times per day)? N/A  3. Are you having a reaction (difficulty breathing--STAT)? N/A  4. What is your medication issue?   Wife called stating patient's prescription for prednisone was not at the pharmacy ([place], [place]).  Wife stated patient will need to take this medication prior to his scheduled procedure tomorrow.

## 2022-06-13 NOTE — Progress Notes (Signed)
[facility] for Infectious Disease  Reason for Consult:recurrent uti  Referring Provider: [facility]    Patient Active Problem List   Diagnosis Date Noted   Acute combined systolic and diastolic heart failure ([place])    UTI due to extended-spectrum beta lactamase (ESBL) producing Escherichia coli 04/29/2022   Dysphagia 04/29/2022   AKI (acute kidney injury) ([place])    Dehydration    Failure to thrive (child)    Bacteriuria, asymptomatic    Failure to thrive in adult 04/20/2022   Malaise, possible ESBL Klebsiella UTI 04/20/2022   Chronic retention of urine 04/20/2022   Closed lumbar vertebral fracture ([place]) 04/20/2022   UTI (urinary tract infection) 03/01/2022   Leukocytosis 01/01/2022   Hyponatremia 01/01/2022   CKD (chronic kidney disease), stage III ([place]) 01/01/2022   HTN (hypertension) 12/31/2021   History of ESBL Klebsiella pneumoniae infection 12/31/2021   Generalized weakness 12/31/2021   Chronic venous insufficiency 08/29/2021   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   Encounter for general adult medical examination with abnormal findings 02/18/2018   Enlarged prostate 02/18/2018   Hearing loss 02/18/2018   Other long term (current) drug therapy 02/18/2018   Type 2 diabetes mellitus with hyperglycemia ([place]) 02/18/2018   HLD (hyperlipidemia)    Nephrolithiasis    Parkinson disease ([place])    Diabetes mellitus with coincident hypertension ([place])    Syncope    Renal calculus    Diabetes ([place])    Allergic rhinitis    BPH (benign prostatic hyperplasia)    Erectile dysfunction    Dysphagia, pharyngeal phase    Edema    Ureteral calculus 02/09/2013   LEG PAIN 03/14/2010      HPI: [name] is a 76 y.o. male parkinsons disease, gerd, renal stone, BPH, dm on insulin, recurrent uti with mdro kleb Pna here for f/u of same   06/13/22 id clinic f/u He has had several visits with me and my partners since spring of 2023 The jist of  the story is that his wife reports patient being more tired with cloudy urine and ?acute on chronic (which has been more clear now leaning on the chronic slow progressive) parkinsonism with difficulty gait/weakness. Each time they would end up sending urine to pcp/urgent care or urology and these urine cultures had mostly grown kleb pna esbl and R quinolone/bactrim as well  At baseline has been using fww to help get around. He mentates well at baseline  He has HF so takes lasis as well and doesn't take much fluid intake otherwise  He had 2 hospital admissions as well during the same time frame. The most recent one 7/16 with true sepsis  He was treated both admissions with carbepenem iv  He was placed on nitrofurantoin uti prevention since 02/27/22  He last saw my partner Dr Juleen China in clinic on 8/15. Seems to have passed a urinary stone. Some fatigue/smelling-cloudy urine reported again but no sepsis. Recent urine culture in urology around that time grew again kleb pna but sensitive to levoflox/macrobid. He was advised to continue good hydration and continue to monitor without treatment   I reviewed with [facility], and the mic is actually 1 for both cipro and levo which is per 2019 clsi resistant (labs haven't and not allowed to update their sensitivity testing cards yet as fda hasn't approved)   Patient actually walking with a cane now not fww. Not taking macrobid any more just methenamine/vit c  He'll have  cardiology ct for cad w/u  From previous initial consult with me: ------------------------- I reviewed his previous urine culture on epic (last one I could see was 12/31/21) 12/31/21 kleb pna pan-resistant except imipenem, but only 20k colonies 06/2021 citrobacter freundii (S cipro/bactrim/gentamycin/nitrofurantoin) and kleb pna (S imipenem but resistant to cipro/bactrim)   Care everywhere labs: 01/31/22 ucx >100k colonies/ml of kleb pna esbl (R cipro, bactrim; s ertapenem)   He has had  about 3-4 episodes of these increased parkinsonism the last 2 years and all got better with abx treatment. He doesn't take over the counter medication intermittently like benadryl that could worsen urinary retention  He denies other sx of typical uti outside of the weakness/malaise and increased parkinsonism (which is mainly more falls and shuffling gait and unsteady). He hasn't had a time of these episode where he clinically watch and improve  He mentions over the last 2 years his parkinson also gets gradually worse  Since his last episode 12/31/21 uti, he had 4-5 good week baseline sx and the last several days have had worsening parkinsonism and weakness/malaise.   He is using a fww now which is very bad for him.  He hasn't felt better since nitrofurantoin and ceftriaxone yet.   He has not had no prostate sling procedure He last had a foley catheter in 12/2021  Normally he hasn't had to straight cath himself    Meds: Sinemet 25-100 3 tablet in morning, and 2 tablet q2hours Entacapone 200 mg tid ropinarole Lantus Acetaminophen Nitrofurantoin 5/16-c Metformin Lisinopril Omeprazole Tamsulosin Furosemide Simvastatin    Review of Systems: ROS All other ros negative      Past Medical History:  Diagnosis Date   Abnormal involuntary movement 02/18/2018   Allergic rhinitis    Arthritis    BPH (benign prostatic hyperplasia)    Diabetes ([place])    Dysphagia, pharyngeal phase    GERD diagnosed on barium swallow. Has small hiatal hernia. Symptomatically somewhat better on omeprazole but not entirely. We'll try b.i.d. therapy   Edema    1+ in both ankles, likely multifactorial including medication such as Requip   Erectile dysfunction    Staxyn 10 mg or Viagra worked well. 3 samples of Cialis 20 mg provided   GERD (gastroesophageal reflux disease)    Hypercholesteremia    Hypertension    Nephrolithiasis    Onychomycosis of toenail    April 27, 2013 - Dr. Inocencio Homes -  podiatry, was in [place] - treating with oral Lamisil and topical nail therapy   Parkinson's disease [facility])     followed by Dr. Lezlie Octave at [facility] and [name], M.D. in [place]   Presbycusis    and tinnitus - Dr. Izora Gala - August/2013   Renal calculus    Syncope     Social History   Tobacco Use   Smoking status: Never   Smokeless tobacco: Never  Vaping Use   Vaping Use: Never used  Substance Use Topics   Alcohol use: No   Drug use: No    Family History  Problem Relation Age of Onset   Atrial fibrillation Mother    Breast cancer Mother    Emphysema Father    Heart disease Father    Cancer Brother        African Burkitt    Allergies  Allergen Reactions   Nucynta [Tapentadol] Other (See Comments)    Made the patient not feel well. "He did not feel well at all."   Oxycodone Itching  Sudafed [Pseudoephedrine Hcl] Other (See Comments)    Problems urinating    Tramadol Hcl Itching and Other (See Comments)    Can tolerate, however (in 2023)    OBJECTIVE: Vitals:   06/13/22 1612  BP: 126/77  Pulse: 60  Temp: 97.7 F (36.5 C)  TempSrc: Temporal  Weight: 185 lb (83.9 kg)    There is no height or weight on file to calculate BMI.   Physical Exam  General/constitutional: no distress, pleasant HEENT: Normocephalic, PER, Conj Clear, EOMI, Oropharynx clear Neck supple CV: rrr no mrg Lungs: clear to auscultation, normal respiratory effort Abd: Soft, Nontender Ext: trace to 1+ bilateral le edema Skin: No Rash Neuro: nonfocal; walks with cane shuffling gait MSK: no peripheral joint swelling/tenderness/warmth; back spines nontender    Lab: Lab Results  Component Value Date   WBC 6.5 05/02/2022   HGB 12.4 (L) 05/02/2022   HCT 37.6 (L) 05/02/2022   MCV 90.8 05/02/2022   PLT 232 54/06/8118   Last metabolic panel Lab Results  Component Value Date   GLUCOSE 142 (H) 05/08/2022   NA 137 05/08/2022   K 3.7 05/08/2022   CL 101 05/08/2022   CO2 29  05/08/2022   BUN 25 (H) 05/08/2022   CREATININE 0.95 05/08/2022   GFRNONAA >60 05/08/2022   CALCIUM 9.1 05/08/2022   PROT 6.8 04/29/2022   ALBUMIN 3.3 (L) 04/29/2022   BILITOT 0.6 04/29/2022   ALKPHOS 89 04/29/2022   AST 10 (L) 04/29/2022   ALT 5 04/29/2022   ANIONGAP 7 05/08/2022    Microbiology:  Serology:  Imaging:   Assessment/plan: Problem List Items Addressed This Visit   None   Discuss concept of assymptomatic bacteriuria and uti  His symptoms although atypical could be consistent with episodes of uti worsening parkinsonism. There is no intermittent offending medications that triggers these symptoms  He has consistently grown kleb pna esbl. His current ceftriaxone and nitrofurantoin are not sensitive to this bacteria  We could try fosfamycin now and if doesn't work, will repeat culture and see if carbapenem is needed    His story suggest that over the last couple years his parkinsonism is slowing progressive but clearly these episodes of presumed uti had demonstrated reversible brief duration of worsening parkinonism   ------------ 5/30 assessment Admitted recently to get carbapenem for presumed uti but no improvement despite cultures x2 without other resistant bacteremia This now appears to be assymptomatic bacteriuria and not uti  Can keep on prophylaxis against uti although at this time not sure if he has had recurrent uti frequently; definitely at risk for it. Can finish weekly fosfomycin then go on daily nitrofurantoin  For his groin rash which is candida intetrigo, will use hydrocortisone and lotrimin cream bid otc for 2 weeks   Follow up in 4-6 weeks  Follow up with dr Sondra Come as per her discretion    04/19/2022 assessment I was able to speak with patient's wife only I advise that someone should see the patient physically and make assessment  The patient has been having issues with mobility due to parkinson rather. We had trialed carbepenem  treatment for what was growing in his urine in 02/2022 without change. At this time I am not sure what the patient's wife reports reflect uti and he should be seen   But if fever, chill, change in mentation I advise them to bring patient to ED  Again discuss urine color/odor/presence of bacteria in urine-wbc/rbc in urine by themselves do not qualify for  a uti   My clinic staff will look for availability in clinic today or tomorrow for patient to be seen and see if blood work needed as well  I also advise them that if they are concerned about uti should go to [place] first   At this time can do 7 days of bid nitrofurantoin. I would avoid bactrim at this time for many reasons and also his esbl was resistant to bactrim previously   Addendum:  I verified that I was speaking with the correct person using two identifiers. Due to the COVID-19 Pandemic/his choosing, this service was provided via telemedicine using audio/visual media.   The patient was located at home. The provider was located in the office. The patient did consent to this visit and is aware of charges through their insurance as well as the limitations of evaluation and management by telemedicine. Other persons participating in this telemedicine service were none. Time spent on visit was greater than 20 minutes on media and in coordination of care  7/12 assessment Doing well postop Reenforce need for intake of fluid and pt/ot and avoiding getting urine testing before talking to ID clinic   F/u as needed    8/30 assessment Doing well without evidence current uti on methanemine/vit c and good hydration Kleb pna colonization crhonically mdro including quinolone (mic 1 for both levo/cipro on 05/18/2022 [facility]), bactrim. S carbapenem Today walked in with cane  Other issue is HF and pending cardiac ischemia w/u with cardiology, and possible urinary stone w/u with urology  Monitor for sign of sepsis as that appears to be only tell  tale sign where directed abx therapy for uti appears to be doing something positive  F/u 3 months or sooner as needed  He asked me about covid booster and despite not rigorous new science to accompany evolution of the virus, I think it is still in his favor given his advanced age, parkinsonism, and chf to get a booster     Follow-up: Return in about 3 months (around 09/13/2022).     Jabier Mutton, [place] for Infectious Disease [facility] 06/13/2022, 4:12 PM

## 2022-06-13 NOTE — Patient Instructions (Signed)
Continue good fluid in and out --> look for clear urine and stable weight (as you also have heart failure)   Check for fever if you are concerned about uti and let me know if temperature > 100.4   Continue methenamine/vit c for prevention of uti   No labs needed today   See me again in 3 months or sooner if other concern relating infection   If you are looking at potentially getting another covid vaccine booster, I think the risk/benefit is still in your favor to do it.

## 2022-06-13 NOTE — Telephone Encounter (Signed)
Spoke with patient's wife, Izora Gala to clarify instructions for cardiac CT scheduled for 06/14/22. Reviewed pre procedure instructions, medications and post procedure instructions. Izora Gala verbalized understanding.

## 2022-06-13 NOTE — Telephone Encounter (Signed)
Attempted to call patient regarding upcoming cardiac CT appointment. °Left message on voicemail with name and callback number ° °Shalin Linders RN Navigator Cardiac Imaging °[facility] °[phone] Office °[phone] Cell ° °

## 2022-06-14 ENCOUNTER — Ambulatory Visit (HOSPITAL_COMMUNITY)
Admission: RE | Admit: 2022-06-14 | Discharge: 2022-06-14 | Disposition: A | Discharge status: Home / Self Care | Payer: Medicare Other | Source: Ambulatory Visit | Attending: Physician Assistant | Admitting: Physician Assistant

## 2022-06-14 ENCOUNTER — Other Ambulatory Visit: Payer: Self-pay | Admitting: Internal Medicine

## 2022-06-14 DIAGNOSIS — I2510 Atherosclerotic heart disease of native coronary artery without angina pectoris: Secondary | ICD-10-CM

## 2022-06-14 DIAGNOSIS — I5020 Unspecified systolic (congestive) heart failure: Secondary | ICD-10-CM | POA: Insufficient documentation

## 2022-06-14 DIAGNOSIS — R931 Abnormal findings on diagnostic imaging of heart and coronary circulation: Secondary | ICD-10-CM

## 2022-06-14 MED ORDER — NITROGLYCERIN 0.4 MG SL SUBL
0.8000 mg | SUBLINGUAL_TABLET | Freq: Once | SUBLINGUAL | Status: AC
  Administered 2022-06-14: 0.8 mg via SUBLINGUAL

## 2022-06-14 MED ORDER — NITROGLYCERIN 0.4 MG SL SUBL
SUBLINGUAL_TABLET | SUBLINGUAL | Status: AC
  Filled 2022-06-14: qty 2

## 2022-06-14 MED ORDER — IOHEXOL 350 MG/ML SOLN
100.0000 mL | Freq: Once | INTRAVENOUS | Status: AC | PRN
  Administered 2022-06-14: 100 mL via INTRAVENOUS

## 2022-06-15 ENCOUNTER — Ambulatory Visit: Payer: Medicare Other | Admitting: Internal Medicine

## 2022-06-15 ENCOUNTER — Telehealth: Payer: Self-pay | Admitting: Cardiology

## 2022-06-15 ENCOUNTER — Ambulatory Visit (HOSPITAL_BASED_OUTPATIENT_CLINIC_OR_DEPARTMENT_OTHER)
Admission: RE | Admit: 2022-06-15 | Discharge: 2022-06-15 | Disposition: A | Discharge status: Home / Self Care | Payer: Medicare Other | Source: Ambulatory Visit | Attending: Internal Medicine | Admitting: Internal Medicine

## 2022-06-15 ENCOUNTER — Ambulatory Visit (HOSPITAL_COMMUNITY)
Admission: RE | Admit: 2022-06-15 | Discharge: 2022-06-15 | Disposition: A | Discharge status: Home / Self Care | Payer: Medicare Other | Source: Ambulatory Visit | Attending: Internal Medicine | Admitting: Internal Medicine

## 2022-06-15 DIAGNOSIS — I5041 Acute combined systolic (congestive) and diastolic (congestive) heart failure: Secondary | ICD-10-CM

## 2022-06-15 DIAGNOSIS — R931 Abnormal findings on diagnostic imaging of heart and coronary circulation: Secondary | ICD-10-CM

## 2022-06-15 DIAGNOSIS — I5020 Unspecified systolic (congestive) heart failure: Secondary | ICD-10-CM | POA: Diagnosis not present

## 2022-06-15 MED ORDER — ROSUVASTATIN CALCIUM 10 MG PO TABS: 10.0000 mg | ORAL_TABLET | Freq: Every day | ORAL | 3 refills | Status: DC

## 2022-06-15 MED ORDER — ASPIRIN 81 MG PO TBEC: 81.0000 mg | DELAYED_RELEASE_TABLET | Freq: Every day | ORAL | 3 refills | Status: DC

## 2022-06-15 NOTE — Telephone Encounter (Signed)
Pt spouse called saying she reviewed Conte's message regarding results on CT and would like c/b to go over them.

## 2022-06-15 NOTE — Telephone Encounter (Signed)
Called to review results and Dr Marlou Porch comments/orders with wife who states understanding. Pt will start ASA 81 mg and Crestor as ordered. Wife reports pt feels poorly all the time.  Falls asleep as soon as he sits down after any activity.  Reports pt stated "I wish I could just feel good for one day." No c/o chest pain/discomfort but does have some SOB (unchanged from previous).   Pt has been schedule to see Dr Marlou Porch in f/u 9/18.  He or his wife will call back prior to then if s/s change or increase.

## 2022-06-15 NOTE — Telephone Encounter (Signed)
Based upon CT results, lets start aspirin 81 mg.  Lets start Crestor 10 mg once a day for plaque stabilization.  With his mildly reduced ejection fraction of 45 to 50%, continue with Toprol as well as losartan and Jardiance.  Blood pressures have been soft.  Continue with current low-dose losartan and Toprol.  Since he is currently asymptomatic without any anginal symptoms, lets hold off on cardiac catheterization.  Lets continue with optimization of medical therapy.  Repeat echocardiogram in 6 months.   Candee Furbish, MD

## 2022-06-19 DIAGNOSIS — N390 Urinary tract infection, site not specified: Secondary | ICD-10-CM | POA: Diagnosis not present

## 2022-06-19 DIAGNOSIS — Z7984 Long term (current) use of oral hypoglycemic drugs: Secondary | ICD-10-CM | POA: Diagnosis not present

## 2022-06-19 DIAGNOSIS — E559 Vitamin D deficiency, unspecified: Secondary | ICD-10-CM | POA: Diagnosis not present

## 2022-06-19 DIAGNOSIS — G20 Parkinson's disease: Secondary | ICD-10-CM | POA: Diagnosis not present

## 2022-06-19 DIAGNOSIS — J309 Allergic rhinitis, unspecified: Secondary | ICD-10-CM | POA: Diagnosis not present

## 2022-06-19 DIAGNOSIS — H9110 Presbycusis, unspecified ear: Secondary | ICD-10-CM | POA: Diagnosis not present

## 2022-06-19 DIAGNOSIS — R1313 Dysphagia, pharyngeal phase: Secondary | ICD-10-CM | POA: Diagnosis not present

## 2022-06-19 DIAGNOSIS — I5041 Acute combined systolic (congestive) and diastolic (congestive) heart failure: Secondary | ICD-10-CM | POA: Diagnosis not present

## 2022-06-19 DIAGNOSIS — Z794 Long term (current) use of insulin: Secondary | ICD-10-CM | POA: Diagnosis not present

## 2022-06-19 DIAGNOSIS — E119 Type 2 diabetes mellitus without complications: Secondary | ICD-10-CM | POA: Diagnosis not present

## 2022-06-19 DIAGNOSIS — Z1612 Extended spectrum beta lactamase (ESBL) resistance: Secondary | ICD-10-CM | POA: Diagnosis not present

## 2022-06-19 DIAGNOSIS — I130 Hypertensive heart and chronic kidney disease with heart failure and stage 1 through stage 4 chronic kidney disease, or unspecified chronic kidney disease: Secondary | ICD-10-CM | POA: Diagnosis not present

## 2022-06-19 DIAGNOSIS — B351 Tinea unguium: Secondary | ICD-10-CM | POA: Diagnosis not present

## 2022-06-19 DIAGNOSIS — K219 Gastro-esophageal reflux disease without esophagitis: Secondary | ICD-10-CM | POA: Diagnosis not present

## 2022-06-19 DIAGNOSIS — N1831 Chronic kidney disease, stage 3a: Secondary | ICD-10-CM | POA: Diagnosis not present

## 2022-06-19 DIAGNOSIS — N200 Calculus of kidney: Secondary | ICD-10-CM | POA: Diagnosis not present

## 2022-06-19 DIAGNOSIS — M1990 Unspecified osteoarthritis, unspecified site: Secondary | ICD-10-CM | POA: Diagnosis not present

## 2022-06-19 DIAGNOSIS — E7800 Pure hypercholesterolemia, unspecified: Secondary | ICD-10-CM | POA: Diagnosis not present

## 2022-06-21 DIAGNOSIS — B351 Tinea unguium: Secondary | ICD-10-CM | POA: Diagnosis not present

## 2022-06-21 DIAGNOSIS — H9110 Presbycusis, unspecified ear: Secondary | ICD-10-CM | POA: Diagnosis not present

## 2022-06-21 DIAGNOSIS — N200 Calculus of kidney: Secondary | ICD-10-CM | POA: Diagnosis not present

## 2022-06-21 DIAGNOSIS — Z1612 Extended spectrum beta lactamase (ESBL) resistance: Secondary | ICD-10-CM | POA: Diagnosis not present

## 2022-06-21 DIAGNOSIS — N390 Urinary tract infection, site not specified: Secondary | ICD-10-CM | POA: Diagnosis not present

## 2022-06-21 DIAGNOSIS — I130 Hypertensive heart and chronic kidney disease with heart failure and stage 1 through stage 4 chronic kidney disease, or unspecified chronic kidney disease: Secondary | ICD-10-CM | POA: Diagnosis not present

## 2022-06-21 DIAGNOSIS — J309 Allergic rhinitis, unspecified: Secondary | ICD-10-CM | POA: Diagnosis not present

## 2022-06-21 DIAGNOSIS — N1831 Chronic kidney disease, stage 3a: Secondary | ICD-10-CM | POA: Diagnosis not present

## 2022-06-21 DIAGNOSIS — R1313 Dysphagia, pharyngeal phase: Secondary | ICD-10-CM | POA: Diagnosis not present

## 2022-06-21 DIAGNOSIS — I5041 Acute combined systolic (congestive) and diastolic (congestive) heart failure: Secondary | ICD-10-CM | POA: Diagnosis not present

## 2022-06-21 DIAGNOSIS — E559 Vitamin D deficiency, unspecified: Secondary | ICD-10-CM | POA: Diagnosis not present

## 2022-06-21 DIAGNOSIS — E119 Type 2 diabetes mellitus without complications: Secondary | ICD-10-CM | POA: Diagnosis not present

## 2022-06-21 DIAGNOSIS — Z794 Long term (current) use of insulin: Secondary | ICD-10-CM | POA: Diagnosis not present

## 2022-06-21 DIAGNOSIS — Z7984 Long term (current) use of oral hypoglycemic drugs: Secondary | ICD-10-CM | POA: Diagnosis not present

## 2022-06-21 DIAGNOSIS — G20 Parkinson's disease: Secondary | ICD-10-CM | POA: Diagnosis not present

## 2022-06-21 DIAGNOSIS — E7800 Pure hypercholesterolemia, unspecified: Secondary | ICD-10-CM | POA: Diagnosis not present

## 2022-06-21 DIAGNOSIS — M1990 Unspecified osteoarthritis, unspecified site: Secondary | ICD-10-CM | POA: Diagnosis not present

## 2022-06-21 DIAGNOSIS — K219 Gastro-esophageal reflux disease without esophagitis: Secondary | ICD-10-CM | POA: Diagnosis not present

## 2022-06-26 ENCOUNTER — Ambulatory Visit: Payer: Medicare Other | Admitting: Neurology

## 2022-06-26 DIAGNOSIS — E119 Type 2 diabetes mellitus without complications: Secondary | ICD-10-CM | POA: Diagnosis not present

## 2022-06-26 DIAGNOSIS — N390 Urinary tract infection, site not specified: Secondary | ICD-10-CM | POA: Diagnosis not present

## 2022-06-26 DIAGNOSIS — K219 Gastro-esophageal reflux disease without esophagitis: Secondary | ICD-10-CM | POA: Diagnosis not present

## 2022-06-26 DIAGNOSIS — B351 Tinea unguium: Secondary | ICD-10-CM | POA: Diagnosis not present

## 2022-06-26 DIAGNOSIS — R1313 Dysphagia, pharyngeal phase: Secondary | ICD-10-CM | POA: Diagnosis not present

## 2022-06-26 DIAGNOSIS — N1831 Chronic kidney disease, stage 3a: Secondary | ICD-10-CM | POA: Diagnosis not present

## 2022-06-26 DIAGNOSIS — Z794 Long term (current) use of insulin: Secondary | ICD-10-CM | POA: Diagnosis not present

## 2022-06-26 DIAGNOSIS — G20 Parkinson's disease: Secondary | ICD-10-CM | POA: Diagnosis not present

## 2022-06-26 DIAGNOSIS — M1990 Unspecified osteoarthritis, unspecified site: Secondary | ICD-10-CM | POA: Diagnosis not present

## 2022-06-26 DIAGNOSIS — Z1612 Extended spectrum beta lactamase (ESBL) resistance: Secondary | ICD-10-CM | POA: Diagnosis not present

## 2022-06-26 DIAGNOSIS — I5041 Acute combined systolic (congestive) and diastolic (congestive) heart failure: Secondary | ICD-10-CM | POA: Diagnosis not present

## 2022-06-26 DIAGNOSIS — H9110 Presbycusis, unspecified ear: Secondary | ICD-10-CM | POA: Diagnosis not present

## 2022-06-26 DIAGNOSIS — N200 Calculus of kidney: Secondary | ICD-10-CM | POA: Diagnosis not present

## 2022-06-26 DIAGNOSIS — E7800 Pure hypercholesterolemia, unspecified: Secondary | ICD-10-CM | POA: Diagnosis not present

## 2022-06-26 DIAGNOSIS — Z7984 Long term (current) use of oral hypoglycemic drugs: Secondary | ICD-10-CM | POA: Diagnosis not present

## 2022-06-26 DIAGNOSIS — E559 Vitamin D deficiency, unspecified: Secondary | ICD-10-CM | POA: Diagnosis not present

## 2022-06-26 DIAGNOSIS — J309 Allergic rhinitis, unspecified: Secondary | ICD-10-CM | POA: Diagnosis not present

## 2022-06-26 DIAGNOSIS — I130 Hypertensive heart and chronic kidney disease with heart failure and stage 1 through stage 4 chronic kidney disease, or unspecified chronic kidney disease: Secondary | ICD-10-CM | POA: Diagnosis not present

## 2022-06-27 DIAGNOSIS — G20 Parkinson's disease: Secondary | ICD-10-CM | POA: Diagnosis not present

## 2022-06-27 DIAGNOSIS — H0270 Unspecified degenerative disorders of eyelid and periocular area: Secondary | ICD-10-CM | POA: Diagnosis not present

## 2022-06-27 DIAGNOSIS — H02834 Dermatochalasis of left upper eyelid: Secondary | ICD-10-CM | POA: Diagnosis not present

## 2022-06-27 DIAGNOSIS — H02831 Dermatochalasis of right upper eyelid: Secondary | ICD-10-CM | POA: Diagnosis not present

## 2022-06-27 DIAGNOSIS — H04123 Dry eye syndrome of bilateral lacrimal glands: Secondary | ICD-10-CM | POA: Diagnosis not present

## 2022-06-27 DIAGNOSIS — H04203 Unspecified epiphora, bilateral lacrimal glands: Secondary | ICD-10-CM | POA: Diagnosis not present

## 2022-06-27 DIAGNOSIS — H04563 Stenosis of bilateral lacrimal punctum: Secondary | ICD-10-CM | POA: Diagnosis not present

## 2022-06-29 DIAGNOSIS — M1990 Unspecified osteoarthritis, unspecified site: Secondary | ICD-10-CM | POA: Diagnosis not present

## 2022-06-29 DIAGNOSIS — R1313 Dysphagia, pharyngeal phase: Secondary | ICD-10-CM | POA: Diagnosis not present

## 2022-06-29 DIAGNOSIS — H9110 Presbycusis, unspecified ear: Secondary | ICD-10-CM | POA: Diagnosis not present

## 2022-06-29 DIAGNOSIS — N390 Urinary tract infection, site not specified: Secondary | ICD-10-CM | POA: Diagnosis not present

## 2022-06-29 DIAGNOSIS — K219 Gastro-esophageal reflux disease without esophagitis: Secondary | ICD-10-CM | POA: Diagnosis not present

## 2022-06-29 DIAGNOSIS — E7800 Pure hypercholesterolemia, unspecified: Secondary | ICD-10-CM | POA: Diagnosis not present

## 2022-06-29 DIAGNOSIS — Z794 Long term (current) use of insulin: Secondary | ICD-10-CM | POA: Diagnosis not present

## 2022-06-29 DIAGNOSIS — G20 Parkinson's disease: Secondary | ICD-10-CM | POA: Diagnosis not present

## 2022-06-29 DIAGNOSIS — I5041 Acute combined systolic (congestive) and diastolic (congestive) heart failure: Secondary | ICD-10-CM | POA: Diagnosis not present

## 2022-06-29 DIAGNOSIS — B351 Tinea unguium: Secondary | ICD-10-CM | POA: Diagnosis not present

## 2022-06-29 DIAGNOSIS — Z7984 Long term (current) use of oral hypoglycemic drugs: Secondary | ICD-10-CM | POA: Diagnosis not present

## 2022-06-29 DIAGNOSIS — I130 Hypertensive heart and chronic kidney disease with heart failure and stage 1 through stage 4 chronic kidney disease, or unspecified chronic kidney disease: Secondary | ICD-10-CM | POA: Diagnosis not present

## 2022-06-29 DIAGNOSIS — N1831 Chronic kidney disease, stage 3a: Secondary | ICD-10-CM | POA: Diagnosis not present

## 2022-06-29 DIAGNOSIS — E559 Vitamin D deficiency, unspecified: Secondary | ICD-10-CM | POA: Diagnosis not present

## 2022-06-29 DIAGNOSIS — N200 Calculus of kidney: Secondary | ICD-10-CM | POA: Diagnosis not present

## 2022-06-29 DIAGNOSIS — E119 Type 2 diabetes mellitus without complications: Secondary | ICD-10-CM | POA: Diagnosis not present

## 2022-06-29 DIAGNOSIS — Z1612 Extended spectrum beta lactamase (ESBL) resistance: Secondary | ICD-10-CM | POA: Diagnosis not present

## 2022-06-29 DIAGNOSIS — J309 Allergic rhinitis, unspecified: Secondary | ICD-10-CM | POA: Diagnosis not present

## 2022-07-02 ENCOUNTER — Ambulatory Visit: Payer: Medicare Other | Attending: Cardiology | Admitting: Cardiology

## 2022-07-02 ENCOUNTER — Encounter: Payer: Self-pay | Admitting: Cardiology

## 2022-07-02 VITALS — BP 120/70 | HR 58 | Ht 72.0 in | Wt 192.0 lb

## 2022-07-02 DIAGNOSIS — Z79899 Other long term (current) drug therapy: Secondary | ICD-10-CM

## 2022-07-02 DIAGNOSIS — I5041 Acute combined systolic (congestive) and diastolic (congestive) heart failure: Secondary | ICD-10-CM

## 2022-07-02 MED ORDER — SACUBITRIL-VALSARTAN 24-26 MG PO TABS
1.0000 | ORAL_TABLET | Freq: Two times a day (BID) | ORAL | 6 refills | Status: DC
Start: 2022-07-02 — End: 2023-02-11

## 2022-07-02 NOTE — Patient Instructions (Signed)
Medication Instructions:  Please discontinue your Losartan. Start Entresto 24-26 mg one tablet twice a day. Continue all other medications as listed.  *If you need a refill on your cardiac medications before your next appointment, please call your pharmacy*  Lab Work: Please have blood work in 1 month (BMP)  If you have labs (blood work) drawn today and your tests are completely normal, you will receive your results only by: [place] (if you have MyChart) OR A paper copy in the mail If you have any lab test that is abnormal or we need to change your treatment, we will call you to review the results.  Follow-Up: At [facility], you and your health needs are our priority.  As part of our continuing mission to provide you with exceptional heart care, we have created designated Provider Care Teams.  These Care Teams include your primary Cardiologist (physician) and Advanced Practice Providers (APPs -  Physician Assistants and Nurse Practitioners) who all work together to provide you with the care you need, when you need it.  We recommend signing up for the patient portal called "MyChart".  Sign up information is provided on this After Visit Summary.  MyChart is used to connect with patients for Virtual Visits (Telemedicine).  Patients are able to view lab/test results, encounter notes, upcoming appointments, etc.  Non-urgent messages can be sent to your provider as well.   To learn more about what you can do with MyChart, go to [email or web address].    Your next appointment:   1 month(s)  The format for your next appointment:   In Person  Provider:   Nicholes Rough, PA-C         Important Information About Sugar

## 2022-07-02 NOTE — Progress Notes (Signed)
Cardiology Office Note:    Date:  07/02/2022   ID:  [name], DOB [birth year], MRN [id number]  PCP:  Josetta Huddle, MD   [facility] Providers Cardiologist:  Candee Furbish, MD     Referring MD: Josetta Huddle, MD     History of Present Illness:    [name] is a 76 y.o. male  here for follow-up of recent CT scan results that demonstrated severe mid LAD as well as circumflex disease FFR positive.  Ejection fraction on echocardiogram also reduced at 45%.  Recent hospitalization with progressive malaise and weakness fever on meropenem.  Has had recurrent UTIs and has been on IV antibiotics multiple times this year.  Lower extremity edema.  He has been taking his Lasix 60 mg twice a day.  Still having some significant swelling.  Wears compression hose sometimes at night and his swelling is better during the morning hours.    Thankfully, he is not having any anginal symptoms, no shortness of breath at this point.  In the hospital stay he did have some mild crackles on lungs.  Past Medical History:  Diagnosis Date   Abnormal involuntary movement 02/18/2018   Allergic rhinitis    Arthritis    BPH (benign prostatic hyperplasia)    Diabetes ([place])    Dysphagia, pharyngeal phase    GERD diagnosed on barium swallow. Has small hiatal hernia. Symptomatically somewhat better on omeprazole but not entirely. We'll try b.i.d. therapy   Edema    1+ in both ankles, likely multifactorial including medication such as Requip   Erectile dysfunction    Staxyn 10 mg or Viagra worked well. 3 samples of Cialis 20 mg provided   GERD (gastroesophageal reflux disease)    Hypercholesteremia    Hypertension    Nephrolithiasis    Onychomycosis of toenail    April 27, 2013 - Dr. Inocencio Homes - podiatry, was in [place] - treating with oral Lamisil and topical nail therapy   Parkinson's disease [facility]     followed by Dr. Lezlie Octave at [facility] and [name], M.D. in [place]   Presbycusis    and tinnitus -  Dr. Izora Gala - August/2013   Renal calculus    Syncope     Past Surgical History:  Procedure Laterality Date   HAND SURGERY     INGUINAL HERNIA REPAIR Left 11/04/2015   Procedure: LEFT INGUINAL HERNIA REPAIR WITH MESH;  Surgeon: Armandina Gemma, MD;  Location: [facility];  Service: General;  Laterality: Left;   INSERTION OF MESH Left 11/04/2015   Procedure: INSERTION OF MESH;  Surgeon: Armandina Gemma, MD;  Location: [facility];  Service: General;  Laterality: Left;   JOINT REPLACEMENT Bilateral    KNEE SURGERY     TOTAL KNEE ARTHROPLASTY      Current Medications: Current Meds  Medication Sig   acetaminophen (TYLENOL) 500 MG tablet Take 1,000 mg by mouth every 6 (six) hours as needed (for pain).   Ascorbic Acid (VITAMIN C) 100 MG tablet Take 100 mg by mouth in the morning and at bedtime.   aspirin EC 81 MG tablet Take 1 tablet (81 mg total) by mouth daily. Swallow whole.   calcium carbonate (TUMS) 500 MG chewable tablet Chew 2 tablets by mouth daily as needed for indigestion or heartburn.   carbidopa-levodopa (SINEMET IR) 25-100 MG tablet Take 3 tablets by mouth 4 (four) times daily. (Patient taking differently: Take 3 tablets by mouth See admin instructions. Take  3 tablets by mouth at 6 AM, 9 AM, 12 NOON, 3 PM, and 6 PM, but can sometimes average only a sum total of 13 tablets/day)   cholecalciferol (VITAMIN D) 1000 UNITS tablet Take 2,000 Units by mouth daily.   empagliflozin (JARDIANCE) 25 MG TABS tablet Take 1 tablet (25 mg total) by mouth daily before breakfast.   entacapone (COMTAN) 200 MG tablet TAKE 1 TABLET BY MOUTH 3 TIMES DAILY (WITH FIRST THREE DOSAGES OF CARBIDOPA/LEVODOPA). (Patient taking differently: Take 200 mg by mouth See admin instructions. Take 200 mg by mouth at 6 AM, 9 AM, and 12 NOON)   furosemide (LASIX) 20 MG tablet Take 1 tablet (20 mg total) by mouth 2 (two) times daily.   GLOBAL EASE INJECT PEN NEEDLES 31G X 5 MM MISC Inject into the  skin.   LANTUS SOLOSTAR 100 UNIT/ML Solostar Pen Inject 15-20 Units into the skin at bedtime.   metFORMIN (GLUCOPHAGE) 500 MG tablet Take 500 mg by mouth 2 (two) times daily with a meal.   methenamine (MANDELAMINE) 1 g tablet Take 1 tablet (1,000 mg total) by mouth 2 (two) times daily. Take with any vitamin c, twice a day for uti prevention (Patient taking differently: Take 1,000 mg by mouth in the morning and at bedtime.)   metoprolol succinate (TOPROL-XL) 25 MG 24 hr tablet Take 1 tablet (25 mg total) by mouth daily.   omeprazole (PRILOSEC) 20 MG capsule Take 20 mg by mouth daily as needed (acid reflux).   potassium chloride SA (KLOR-CON) 20 MEQ tablet Take 10 mEq by mouth 2 (two) times daily.   QUEtiapine (SEROQUEL) 25 MG tablet Take 1 tablet (25 mg total) by mouth at bedtime.   rosuvastatin (CRESTOR) 10 MG tablet Take 1 tablet (10 mg total) by mouth daily.   sacubitril-valsartan (ENTRESTO) 24-26 MG Take 1 tablet by mouth 2 (two) times daily.   tamsulosin (FLOMAX) 0.4 MG CAPS capsule Take 0.4 mg by mouth in the morning and at bedtime.   traMADol (ULTRAM) 50 MG tablet Take 1 tablet (50 mg total) by mouth every 6 (six) hours as needed (for pain).   triamcinolone (NASACORT) 55 MCG/ACT AERO nasal inhaler Place 2 sprays into the nose daily as needed (for allergies or rhinitis).   vitamin B-12 (CYANOCOBALAMIN) 1000 MCG tablet Take 1,000 mcg by mouth daily.   [DISCONTINUED] losartan (COZAAR) 25 MG tablet Take 0.5 tablets (12.5 mg total) by mouth daily.     Allergies:   Nucynta [tapentadol], Oxycodone, Sudafed [pseudoephedrine hcl], and Tramadol hcl   Social History   Socioeconomic History   Marital status: Married    Spouse name: Not on file   Number of children: Not on file   Years of education: Not on file   Highest education level: Not on file  Occupational History   Not on file  Tobacco Use   Smoking status: Never   Smokeless tobacco: Never  Vaping Use   Vaping Use: Never used   Substance and Sexual Activity   Alcohol use: No   Drug use: No   Sexual activity: Not on file  Other Topics Concern   Not on file  Social History Narrative   Right Handed    Lives 3 story home   Social Determinants of Health   Financial Resource Strain: Not on file  Food Insecurity: Not on file  Transportation Needs: Not on file  Physical Activity: Not on file  Stress: Not on file  Social Connections: Not on file  Family History: The patient's family history includes Atrial fibrillation in his mother; Breast cancer in his mother; Cancer in his brother; Emphysema in his father; Heart disease in his father.  ROS:   Please see the history of present illness.     All other systems reviewed and are negative.  EKGs/Labs/Other Studies Reviewed:    The following studies were reviewed today:  Coronary CT scan 06/14/2022: Coronary calcium score is 1250, which places the patient in the 82nd percentile for age and sex matched control.   Coronary arteries: Normal coronary origins.  Right dominance.   Right Coronary Artery: Mild mixed atherosclerosis in the proximal RCA, 25-49% stenosis. Moderate mixed atherosclerosis in the proximal-mid RCA, 50-69% stenosis. Severe mixed atherosclerosis in the mid RCA, 70-99% stenosis. Patent PDA and PLA.   Left Main Coronary Artery: No detectable plaque or stenosis.   Left Anterior Descending Coronary Artery: Mild mixed atherosclerotic plaque in the ostial and proximal LAD, 25-49% stenosis. Moderate mixed atherosclerotic plaque in the proximal LAD, 50-69% stenosis. Severe mixed atherosclerotic plaque in the mid and distal LAD, 70-99% stenosis, serial lesions. Mild mixed atherosclerotic plaque in the proximal first diagonal artery, 25-49% stenosis.   Left Circumflex Artery: Severe, serial mixed atherosclerotic plaques in the proximal LCx, 70-99% stenosis, followed by a moderate mixed atherosclerotic plaque in the proximal LCx, 50-69%  stenosis. Proximal OM1 has a mild mixed atherosclerotic plaque, 25-49% stenosis   Aorta: Normal size, 34 mm at the mid ascending aorta (level of the PA bifurcation) measured double oblique. No calcifications. No dissection.   Aortic Valve: Mild calcifications. AV calcium score 96   Other findings:   Normal pulmonary vein drainage into the left atrium.   Normal left atrial appendage without thrombus.   Borderline dilation of main pulmonary artery, 29 mm.   IMPRESSION: 1. Severe CAD in the mid and distal LAD, proximal LCx, and mid RCA, 70-99% stenosis, CADRADS 4. CT FFR will be performed and reported separately.   2. Coronary calcium score is 1250, which places the patient in the 82nd percentile for age and sex matched control.   3. Normal coronary origins with right dominance.  FFR analysis: 1. Left Main: FFR = 0.93   2. LAD: Proximal FFR = 0.88, mid FFR = 0.56, Distal FFR = 0.52 3. LCX: Proximal FFR = 0.89, distal FFR = 0.78, OM1 FFR = 0.75. OM1 lesions may be impacted by serial lesions in the proximal LCx more so than truly representing a significant delta in FFR. 4. RCA: Proximal FFR = 0.89, Mid FFR =0.80, Distal FFR = 0.67   IMPRESSION: 1. CT FFR analysis showed high likelihood of hemodynamically significant stenosis in the mid LAD, and mid-distal RCA.   2. CT FFR analysis showed borderline likelihood of hemodynamically significant stenosis in the proximal LCx and OM1.    Echocardiogram 05/04/2022   IMPRESSIONS     1. Inferior septal and apical hypokinesis . Left ventricular ejection  fraction, by estimation, is 45 to 50%. The left ventricle has mildly  decreased function. The left ventricle demonstrates regional wall motion  abnormalities (see scoring  diagram/findings for description). The left ventricular internal cavity  size was moderately dilated. There is moderate left ventricular  hypertrophy. Left ventricular diastolic parameters are indeterminate.    2. Right ventricular systolic function is normal. The right ventricular  size is normal.   3. Left atrial size was mild to moderately dilated.   4. The mitral valve is abnormal. Trivial mitral valve regurgitation. No  evidence  of mitral stenosis.   5. The aortic valve is tricuspid. There is mild calcification of the  aortic valve. There is mild thickening of the aortic valve. Aortic valve  regurgitation is not visualized. Aortic valve sclerosis is present, with  no evidence of aortic valve stenosis.   6. The inferior vena cava is dilated in size with >50% respiratory  variability, suggesting right atrial pressure of 8 mmHg.    DVT 11/20:  Summary:  Right: No evidence of deep vein thrombosis in the lower extremity. No  indirect evidence of obstruction proximal to the inguinal ligament. No  cystic structure found in the popliteal fossa. There is suprapatellar  fluid seen with thickened synovium within.  There is also superficial edema seen throughout the mid and posterior  medial calf.  Left: No evidence of common femoral vein obstruction.     Recent Labs: 01/02/2022: Magnesium 2.0 04/29/2022: ALT 5 05/02/2022: Hemoglobin 12.4; Platelets 232 05/07/2022: B Natriuretic Peptide 266.5 05/08/2022: BUN 25; Creatinine, Ser 0.95; Potassium 3.7; Sodium 137  Recent Lipid Panel No results found for: "CHOL", "TRIG", "HDL", "CHOLHDL", "VLDL", "LDLCALC", "LDLDIRECT"   Risk Assessment/Calculations:          Physical Exam:    VS:  BP 120/70 (BP Location: Left Arm, Patient Position: Sitting, Cuff Size: Normal)   Pulse (!) 58   Ht 6' (1.829 m)   Wt 192 lb (87.1 kg)   SpO2 98%   BMI 26.04 kg/m     Wt Readings from Last 3 Encounters:  07/02/22 192 lb (87.1 kg)  06/13/22 185 lb (83.9 kg)  05/29/22 186 lb (84.4 kg)     GEN:  Well nourished, well developed in no acute distress, ambulates with a cane.  Subtle tremor noted. HEENT: Normal NECK: No JVD; No carotid bruits LYMPHATICS: No  lymphadenopathy CARDIAC: RRR, no murmurs, rubs, gallops RESPIRATORY:  Clear to auscultation without rales, wheezing or rhonchi  ABDOMEN: Soft, non-tender, non-distended MUSCULOSKELETAL:  No edema; No deformity  SKIN: Warm and dry NEUROLOGIC:  Alert and oriented x 3 PSYCHIATRIC:  Normal affect   ASSESSMENT:    1. Acute combined systolic and diastolic heart failure ([place])   2. 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  FFR positive/abnormal.  Started aspirin 81 mg as well as Crestor.  He reported that he felt poorly all of the time, falls asleep as soon as he sits down with any activity.  He stated that he wished she could just feel good for 1 day.  No real chest pain or discomfort also states that he is not having any significant shortness of breath.  -We will go ahead and place him on Entresto 24/26 twice a day.  Stop losartan 25.  Watch for any signs of hypotension.  Mildly reduced ejection fraction 45%  I will have him come back in in 1 month to check a basic metabolic profile.  His creatinine currently is in the 1.2 range.  I want to make sure he is not hypotensive.  Edema Chronic over the last 15 years he states.  Occasional Lasix.   Parkinson's disease Medications reviewed.  Neurology following.  Dr. Carles Collet.   Hypercholesteremia Crestor 10 mg.  Plaque stabilization.  Prior LDL 72 in November 2022.         Medication Adjustments/Labs and Tests Ordered:  Current medicines are reviewed at length with the patient today.  Concerns regarding medicines are outlined above.  Orders Placed This Encounter  Procedures   Basic metabolic panel    Meds ordered this encounter  Medications   sacubitril-valsartan (ENTRESTO) 24-26 MG    Sig: Take 1 tablet by mouth 2 (two) times daily.    Dispense:  60 tablet    Refill:  6      Patient Instructions  Medication Instructions:  Please discontinue your Losartan. Start Entresto 24-26 mg one tablet twice a day. Continue all other medications as listed.  *If you need a refill on your cardiac medications before your next appointment, please call your pharmacy*  Lab Work: Please have blood work in 1 month (BMP)  If you have labs (blood work) drawn today and your tests are completely normal, you will receive your results only by: [place] (if you have MyChart) OR A paper copy in the mail If you have any lab test that is abnormal or we need to change your treatment, we will call you to review the results.  Follow-Up: At [facility], you and your health needs are our priority.  As part of our continuing mission to provide you with exceptional heart care, we have created designated Provider Care Teams.  These Care Teams include your primary Cardiologist (physician) and Advanced Practice Providers (APPs -  Physician Assistants and Nurse Practitioners) who all work together to provide you with the care you need, when you need it.  We recommend signing up for the patient portal called "MyChart".  Sign up information is provided on this After Visit Summary.  MyChart is used to connect with patients for Virtual Visits (Telemedicine).  Patients are able to view lab/test results, encounter notes, upcoming appointments, etc.  Non-urgent messages can be sent to your provider as well.   To learn more about what you can do with MyChart, go to [email or web address].    Your next appointment:   1 month(s)  The format for your next appointment:   In Person  Provider:   Nicholes Rough, PA-C         Important Information About Sugar         Signed, Candee Furbish, MD  07/02/2022 12:24 PM    [place]

## 2022-07-03 ENCOUNTER — Telehealth: Payer: Self-pay | Admitting: Cardiology

## 2022-07-03 DIAGNOSIS — H9110 Presbycusis, unspecified ear: Secondary | ICD-10-CM | POA: Diagnosis not present

## 2022-07-03 DIAGNOSIS — B351 Tinea unguium: Secondary | ICD-10-CM | POA: Diagnosis not present

## 2022-07-03 DIAGNOSIS — N1831 Chronic kidney disease, stage 3a: Secondary | ICD-10-CM | POA: Diagnosis not present

## 2022-07-03 DIAGNOSIS — N390 Urinary tract infection, site not specified: Secondary | ICD-10-CM | POA: Diagnosis not present

## 2022-07-03 DIAGNOSIS — I5041 Acute combined systolic (congestive) and diastolic (congestive) heart failure: Secondary | ICD-10-CM | POA: Diagnosis not present

## 2022-07-03 DIAGNOSIS — E559 Vitamin D deficiency, unspecified: Secondary | ICD-10-CM | POA: Diagnosis not present

## 2022-07-03 DIAGNOSIS — K219 Gastro-esophageal reflux disease without esophagitis: Secondary | ICD-10-CM | POA: Diagnosis not present

## 2022-07-03 DIAGNOSIS — J309 Allergic rhinitis, unspecified: Secondary | ICD-10-CM | POA: Diagnosis not present

## 2022-07-03 DIAGNOSIS — N200 Calculus of kidney: Secondary | ICD-10-CM | POA: Diagnosis not present

## 2022-07-03 DIAGNOSIS — Z7984 Long term (current) use of oral hypoglycemic drugs: Secondary | ICD-10-CM | POA: Diagnosis not present

## 2022-07-03 DIAGNOSIS — E119 Type 2 diabetes mellitus without complications: Secondary | ICD-10-CM | POA: Diagnosis not present

## 2022-07-03 DIAGNOSIS — M1990 Unspecified osteoarthritis, unspecified site: Secondary | ICD-10-CM | POA: Diagnosis not present

## 2022-07-03 DIAGNOSIS — I130 Hypertensive heart and chronic kidney disease with heart failure and stage 1 through stage 4 chronic kidney disease, or unspecified chronic kidney disease: Secondary | ICD-10-CM | POA: Diagnosis not present

## 2022-07-03 DIAGNOSIS — Z1612 Extended spectrum beta lactamase (ESBL) resistance: Secondary | ICD-10-CM | POA: Diagnosis not present

## 2022-07-03 DIAGNOSIS — E7800 Pure hypercholesterolemia, unspecified: Secondary | ICD-10-CM | POA: Diagnosis not present

## 2022-07-03 DIAGNOSIS — G20 Parkinson's disease: Secondary | ICD-10-CM | POA: Diagnosis not present

## 2022-07-03 DIAGNOSIS — Z794 Long term (current) use of insulin: Secondary | ICD-10-CM | POA: Diagnosis not present

## 2022-07-03 DIAGNOSIS — R1313 Dysphagia, pharyngeal phase: Secondary | ICD-10-CM | POA: Diagnosis not present

## 2022-07-03 NOTE — Telephone Encounter (Signed)
Pt c/o medication issue:  1. Name of Medication: sacubitril-valsartan (ENTRESTO) 24-26 MG  2. How are you currently taking this medication (dosage and times per day)? Take 1 tablet by mouth 2 (two) times daily.  3. Are you having a reaction (difficulty breathing--STAT)?   4. What is your medication issue? Patient wife called wanting to make sure patient should be on this medication.  She states on the after visit summary it says to stop taking losartan, and start taking Entresto.  However, she states patient was never on losartan.  Please advise.

## 2022-07-03 NOTE — Telephone Encounter (Signed)
Spoke with wife, Izora Gala who reports pt has not been taking Losartan.  Advised yesterday during the visit and medication rec. They reported he was taking 12.5 mg daily.  Reviewed records and Losartan was started during pt's July hospitalization and was to continue 12.5 mg daily at d/c.  Izora Gala reports pt went to [facility]  after hospitalization and it must have been d/ced while he was there.  Pt does not have a prescription for it and the pharmacy they use reported it has never been filled through them.  Shawna Clamp OK to start [facility] as ordered.  Reviewed other medications with her.   Discovered pt still had RX for simvastatin.  Advised the d/c it and only take Rosuvastatin.  Izora Gala states understanding and will dispose of  the simvastatin.  She will call back with any further questions or concerns.

## 2022-07-13 DIAGNOSIS — G20 Parkinson's disease: Secondary | ICD-10-CM | POA: Diagnosis not present

## 2022-07-13 DIAGNOSIS — E559 Vitamin D deficiency, unspecified: Secondary | ICD-10-CM | POA: Diagnosis not present

## 2022-07-13 DIAGNOSIS — I130 Hypertensive heart and chronic kidney disease with heart failure and stage 1 through stage 4 chronic kidney disease, or unspecified chronic kidney disease: Secondary | ICD-10-CM | POA: Diagnosis not present

## 2022-07-13 DIAGNOSIS — B351 Tinea unguium: Secondary | ICD-10-CM | POA: Diagnosis not present

## 2022-07-13 DIAGNOSIS — Z794 Long term (current) use of insulin: Secondary | ICD-10-CM | POA: Diagnosis not present

## 2022-07-13 DIAGNOSIS — Z1612 Extended spectrum beta lactamase (ESBL) resistance: Secondary | ICD-10-CM | POA: Diagnosis not present

## 2022-07-13 DIAGNOSIS — I5041 Acute combined systolic (congestive) and diastolic (congestive) heart failure: Secondary | ICD-10-CM | POA: Diagnosis not present

## 2022-07-13 DIAGNOSIS — Z7984 Long term (current) use of oral hypoglycemic drugs: Secondary | ICD-10-CM | POA: Diagnosis not present

## 2022-07-13 DIAGNOSIS — N1831 Chronic kidney disease, stage 3a: Secondary | ICD-10-CM | POA: Diagnosis not present

## 2022-07-13 DIAGNOSIS — J309 Allergic rhinitis, unspecified: Secondary | ICD-10-CM | POA: Diagnosis not present

## 2022-07-13 DIAGNOSIS — E7800 Pure hypercholesterolemia, unspecified: Secondary | ICD-10-CM | POA: Diagnosis not present

## 2022-07-13 DIAGNOSIS — E119 Type 2 diabetes mellitus without complications: Secondary | ICD-10-CM | POA: Diagnosis not present

## 2022-07-13 DIAGNOSIS — M1990 Unspecified osteoarthritis, unspecified site: Secondary | ICD-10-CM | POA: Diagnosis not present

## 2022-07-13 DIAGNOSIS — N390 Urinary tract infection, site not specified: Secondary | ICD-10-CM | POA: Diagnosis not present

## 2022-07-13 DIAGNOSIS — H9110 Presbycusis, unspecified ear: Secondary | ICD-10-CM | POA: Diagnosis not present

## 2022-07-13 DIAGNOSIS — R1313 Dysphagia, pharyngeal phase: Secondary | ICD-10-CM | POA: Diagnosis not present

## 2022-07-13 DIAGNOSIS — N200 Calculus of kidney: Secondary | ICD-10-CM | POA: Diagnosis not present

## 2022-07-13 DIAGNOSIS — K219 Gastro-esophageal reflux disease without esophagitis: Secondary | ICD-10-CM | POA: Diagnosis not present

## 2022-07-18 ENCOUNTER — Other Ambulatory Visit: Payer: Self-pay | Admitting: Neurology

## 2022-07-18 DIAGNOSIS — G20B1 Parkinson's disease with dyskinesia, without mention of fluctuations: Secondary | ICD-10-CM

## 2022-07-19 DIAGNOSIS — S32010D Wedge compression fracture of first lumbar vertebra, subsequent encounter for fracture with routine healing: Secondary | ICD-10-CM | POA: Diagnosis not present

## 2022-07-26 ENCOUNTER — Other Ambulatory Visit: Payer: Self-pay | Admitting: Internal Medicine

## 2022-07-26 ENCOUNTER — Ambulatory Visit
Admission: RE | Admit: 2022-07-26 | Discharge: 2022-07-26 | Disposition: A | Discharge status: Home / Self Care | Payer: Medicare Other | Source: Ambulatory Visit | Attending: Internal Medicine | Admitting: Internal Medicine

## 2022-07-26 DIAGNOSIS — R0781 Pleurodynia: Secondary | ICD-10-CM

## 2022-07-26 DIAGNOSIS — M25531 Pain in right wrist: Secondary | ICD-10-CM

## 2022-07-26 DIAGNOSIS — M19011 Primary osteoarthritis, right shoulder: Secondary | ICD-10-CM | POA: Diagnosis not present

## 2022-07-26 DIAGNOSIS — R0789 Other chest pain: Secondary | ICD-10-CM

## 2022-07-26 DIAGNOSIS — M25511 Pain in right shoulder: Secondary | ICD-10-CM

## 2022-07-26 DIAGNOSIS — W19XXXA Unspecified fall, initial encounter: Secondary | ICD-10-CM | POA: Diagnosis not present

## 2022-07-26 DIAGNOSIS — R918 Other nonspecific abnormal finding of lung field: Secondary | ICD-10-CM | POA: Diagnosis not present

## 2022-07-30 ENCOUNTER — Other Ambulatory Visit: Payer: Medicare Other

## 2022-08-01 ENCOUNTER — Ambulatory Visit: Payer: Medicare Other | Admitting: Physician Assistant

## 2022-08-03 ENCOUNTER — Other Ambulatory Visit: Payer: Self-pay | Admitting: Neurology

## 2022-08-03 DIAGNOSIS — G20A1 Parkinson's disease without dyskinesia, without mention of fluctuations: Secondary | ICD-10-CM

## 2022-08-03 DIAGNOSIS — Z23 Encounter for immunization: Secondary | ICD-10-CM | POA: Diagnosis not present

## 2022-08-03 DIAGNOSIS — I10 Essential (primary) hypertension: Secondary | ICD-10-CM | POA: Diagnosis not present

## 2022-08-03 DIAGNOSIS — R600 Localized edema: Secondary | ICD-10-CM | POA: Diagnosis not present

## 2022-08-03 DIAGNOSIS — I5041 Acute combined systolic (congestive) and diastolic (congestive) heart failure: Secondary | ICD-10-CM | POA: Diagnosis not present

## 2022-08-09 ENCOUNTER — Telehealth: Payer: Self-pay

## 2022-08-09 DIAGNOSIS — R310 Gross hematuria: Secondary | ICD-10-CM | POA: Diagnosis not present

## 2022-08-09 DIAGNOSIS — N3289 Other specified disorders of bladder: Secondary | ICD-10-CM | POA: Diagnosis not present

## 2022-08-09 DIAGNOSIS — N200 Calculus of kidney: Secondary | ICD-10-CM | POA: Diagnosis not present

## 2022-08-09 DIAGNOSIS — K8020 Calculus of gallbladder without cholecystitis without obstruction: Secondary | ICD-10-CM | POA: Diagnosis not present

## 2022-08-09 DIAGNOSIS — N3020 Other chronic cystitis without hematuria: Secondary | ICD-10-CM | POA: Diagnosis not present

## 2022-08-09 DIAGNOSIS — N289 Disorder of kidney and ureter, unspecified: Secondary | ICD-10-CM | POA: Diagnosis not present

## 2022-08-09 NOTE — Telephone Encounter (Signed)
Scheduled with Dr. Gale Journey 11/7 for follow up regarding recurrent UTIs. Advised patient that if he develops fever, chills, or flank pain in the meantime to let us know and go to the emergency room per Dr. Gale Journey. Patient verbalized understanding and has no further questions.   Beryle Flock, RN

## 2022-08-17 ENCOUNTER — Telehealth: Payer: Self-pay

## 2022-08-17 NOTE — Telephone Encounter (Signed)
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 I am not sure what is causing his hematuria and dr bell from urology is following that   My plan for him (see if atephanie could see tomorrow) was to access for labs including crp, cbc, cmp and see how he does  One time he did have fever a nd we treated him as uti   So difficult to say but if this is infact uti he can only have iv abx   So it would be bwtter with labs/face to face evaluation  I dont have a more intelligible advice otherwise without history and labs

## 2022-08-17 NOTE — Telephone Encounter (Signed)
Received call from [employer], patient's wife. She says that [name] is sleeping a lot during the day and overall not feeling well. He is passing blood in his urine. Denies fevers. He saw Dr. Gloriann Loan last week. Advised her to notify patient's urologist of his symptoms as well.   Advised that there are no appointments today. Recommended urgent care or emergency department for faster evaluation as the office does close at 12:30 today.  She would like to hear Dr. Hart Rochester recommendation.  Beryle Flock, RN

## 2022-08-20 NOTE — Telephone Encounter (Signed)
Spoke with Dr. Gale Journey, will keep appointment for 11/7 for face to face evaluation.   Beryle Flock, RN

## 2022-08-21 ENCOUNTER — Ambulatory Visit (INDEPENDENT_AMBULATORY_CARE_PROVIDER_SITE_OTHER): Payer: Medicare Other | Admitting: Internal Medicine

## 2022-08-21 ENCOUNTER — Ambulatory Visit: Payer: Medicare Other | Attending: Cardiology

## 2022-08-21 ENCOUNTER — Other Ambulatory Visit: Payer: Self-pay

## 2022-08-21 ENCOUNTER — Ambulatory Visit
Admission: RE | Admit: 2022-08-21 | Discharge: 2022-08-21 | Disposition: A | Discharge status: Home / Self Care | Payer: Medicare Other | Source: Ambulatory Visit | Attending: Internal Medicine | Admitting: Internal Medicine

## 2022-08-21 ENCOUNTER — Encounter: Payer: Self-pay | Admitting: Internal Medicine

## 2022-08-21 VITALS — BP 113/70 | HR 60 | Temp 97.9°F | Resp 16 | Wt 191.8 lb

## 2022-08-21 DIAGNOSIS — R3989 Other symptoms and signs involving the genitourinary system: Secondary | ICD-10-CM

## 2022-08-21 DIAGNOSIS — I5041 Acute combined systolic (congestive) and diastolic (congestive) heart failure: Secondary | ICD-10-CM | POA: Diagnosis not present

## 2022-08-21 DIAGNOSIS — Z79899 Other long term (current) drug therapy: Secondary | ICD-10-CM | POA: Diagnosis not present

## 2022-08-21 DIAGNOSIS — N390 Urinary tract infection, site not specified: Secondary | ICD-10-CM

## 2022-08-21 DIAGNOSIS — N3289 Other specified disorders of bladder: Secondary | ICD-10-CM | POA: Diagnosis not present

## 2022-08-21 NOTE — Progress Notes (Signed)
[facility]ious [facility]  Reason for Consult:recurrent uti  Referring Provider: [facility]    Patient Active Problem List   Diagnosis Date Noted   Acute combined systolic and diastolic heart failure ([place])    UTI due to extended-spectrum beta lactamase (ESBL) producing Escherichia coli 04/29/2022   Dysphagia 04/29/2022   AKI (acute kidney injury) ([place])    Dehydration    Failure to thrive (child)    Bacteriuria, asymptomatic    Failure to thrive in adult 04/20/2022   Malaise, possible ESBL Klebsiella UTI 04/20/2022   Chronic retention of urine 04/20/2022   Closed lumbar vertebral fracture ([place]) 04/20/2022   UTI (urinary tract infection) 03/01/2022   Leukocytosis 01/01/2022   Hyponatremia 01/01/2022   CKD (chronic kidney disease), stage III ([place]) 01/01/2022   HTN (hypertension) 12/31/2021   History of ESBL Klebsiella pneumoniae infection 12/31/2021   Generalized weakness 12/31/2021   Chronic venous insufficiency 08/29/2021   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   Encounter for general adult medical examination with abnormal findings 02/18/2018   Enlarged prostate 02/18/2018   Hearing loss 02/18/2018   Other long term (current) drug therapy 02/18/2018   Type 2 diabetes mellitus with hyperglycemia ([place]) 02/18/2018   HLD (hyperlipidemia)    Nephrolithiasis    Parkinson disease ([place])    Diabetes mellitus with coincident hypertension ([place])    Syncope    Renal calculus    Diabetes ([place])    Allergic rhinitis    BPH (benign prostatic hyperplasia)    Erectile dysfunction    Dysphagia, pharyngeal phase    Edema    Ureteral calculus 02/09/2013   LEG PAIN 03/14/2010      HPI: [name] is a 76 y.o. male parkinsons disease, gerd, renal stone, BPH, dm on insulin, recurrent uti with mdro kleb Pna here for f/u of same  08/21/2022 id f/u Patient is here for f/u of gross hematuria and recent ct renal protocol showing air in  bladder. I spoke with dr bell. Our discussion revolves around if he has infection. Patient is nontoxic and today appears the same as when he is feeling well Wife reports he is more sleepy He is diagnosed with chf ef 45% recently and meds switched to [facility]. He is on aspirin 81 mg no eliquis or coumadin or other DOAC He is not on bb No f/c No n/v Old lower back compression fx pain He said if he has kidney infection he would have it on right side  Intermittently every few days would pass blood in urine (4 days prior to this clinic). Sensation of peeing air was about 10 days ago   He is walking with a cane today to our clinic. He previously used fww   06/13/22 id clinic f/u He has had several visits with me and my partners since spring of 2023 The jist of the story is that his wife reports patient being more tired with cloudy urine and ?acute on chronic (which has been more clear now leaning on the chronic slow progressive) parkinsonism with difficulty gait/weakness. Each time they would end up sending urine to pcp/urgent care or urology and these urine cultures had mostly grown kleb pna esbl and R quinolone/bactrim as well  At baseline has been using fww to help get around. He mentates well at baseline  He has HF so takes lasis as well and doesn't take much fluid intake otherwise  He had 2 hospital admissions as  well during the same time frame. The most recent one 7/16 with true sepsis  He was treated both admissions with carbepenem iv  He was placed on nitrofurantoin uti prevention since 02/27/22  He last saw my partner Dr Juleen China in clinic on 8/15. Seems to have passed a urinary stone. Some fatigue/smelling-cloudy urine reported again but no sepsis. Recent urine culture in urology around that time grew again kleb pna but sensitive to levoflox/macrobid. He was advised to continue good hydration and continue to monitor without treatment   I reviewed with [facility], and the mic is actually  1 for both cipro and levo which is per 2019 clsi resistant (labs haven't and not allowed to update their sensitivity testing cards yet as fda hasn't approved)   Patient actually walking with a cane now not fww. Not taking macrobid any more just methenamine/vit c  He'll have cardiology ct for cad w/u  From previous initial consult with me: ------------------------- I reviewed his previous urine culture on epic (last one I could see was 12/31/21) 12/31/21 kleb pna pan-resistant except imipenem, but only 20k colonies 06/2021 citrobacter freundii (S cipro/bactrim/gentamycin/nitrofurantoin) and kleb pna (S imipenem but resistant to cipro/bactrim)   Care everywhere labs: 01/31/22 ucx >100k colonies/ml of kleb pna esbl (R cipro, bactrim; s ertapenem)   He has had about 3-4 episodes of these increased parkinsonism the last 2 years and all got better with abx treatment. He doesn't take over the counter medication intermittently like benadryl that could worsen urinary retention  He denies other sx of typical uti outside of the weakness/malaise and increased parkinsonism (which is mainly more falls and shuffling gait and unsteady). He hasn't had a time of these episode where he clinically watch and improve  He mentions over the last 2 years his parkinson also gets gradually worse  Since his last episode 12/31/21 uti, he had 4-5 good week baseline sx and the last several days have had worsening parkinsonism and weakness/malaise.   He is using a fww now which is very bad for him.  He hasn't felt better since nitrofurantoin and ceftriaxone yet.   He has not had no prostate sling procedure He last had a foley catheter in 12/2021  Normally he hasn't had to straight cath himself    Meds: Sinemet 25-100 3 tablet in morning, and 2 tablet q2hours Entacapone 200 mg tid ropinarole Lantus Acetaminophen Nitrofurantoin  5/16-c Metformin Lisinopril Omeprazole Tamsulosin Furosemide Simvastatin    Review of Systems: All other ros negative      Past Medical History:  Diagnosis Date   Abnormal involuntary movement 02/18/2018   Allergic rhinitis    Arthritis    BPH (benign prostatic hyperplasia)    Diabetes ([place])    Dysphagia, pharyngeal phase    GERD diagnosed on barium swallow. Has small hiatal hernia. Symptomatically somewhat better on omeprazole but not entirely. We'll try b.i.d. therapy   Edema    1+ in both ankles, likely multifactorial including medication such as Requip   Erectile dysfunction    Staxyn 10 mg or Viagra worked well. 3 samples of Cialis 20 mg provided   GERD (gastroesophageal reflux disease)    Hypercholesteremia    Hypertension    Nephrolithiasis    Onychomycosis of toenail    April 27, 2013 - Dr. Inocencio Homes - podiatry, was in [place] - treating with oral Lamisil and topical nail therapy   Parkinson's disease     followed by Dr. Lezlie Octave at [facility] and [name], M.D.  in [place]   Presbycusis    and tinnitus - Dr. Izora Gala - August/2013   Renal calculus    Syncope     Social History   Tobacco Use   Smoking status: Never   Smokeless tobacco: Never  Vaping Use   Vaping Use: Never used  Substance Use Topics   Alcohol use: No   Drug use: No    Family History  Problem Relation Age of Onset   Atrial fibrillation Mother    Breast cancer Mother    Emphysema Father    Heart disease Father    Cancer Brother        [place]    Allergies  Allergen Reactions   Nucynta [Tapentadol] Other (See Comments)    Made the patient not feel well. "He did not feel well at all."   Oxycodone Itching   Sudafed [Pseudoephedrine Hcl] Other (See Comments)    Problems urinating    Tramadol Hcl Itching and Other (See Comments)    Can tolerate, however (in 2023)    OBJECTIVE: Vitals:   08/21/22 1505  BP: 113/70  Pulse: 60  Resp: 16  Temp: 97.9 F (36.6  C)  TempSrc: Oral  SpO2: 96%  Weight: 191 lb 12.8 oz (87 kg)    Body mass index is 26.01 kg/m.   Physical Exam  General/constitutional: no distress, pleasant; walks with cane HEENT: Normocephalic, PER, Conj Clear, EOMI, Oropharynx clear Neck supple CV: rrr no mrg Lungs: clear to auscultation, normal respiratory effort Abd: Soft, Nontender Ext: 1+ bilateral LE edema to knees Skin: No Rash Neuro: nonfocal MSK: no peripheral joint swelling/tenderness/warmth; back spines nontender     Lab: Lab Results  Component Value Date   WBC 6.5 05/02/2022   HGB 12.4 (L) 05/02/2022   HCT 37.6 (L) 05/02/2022   MCV 90.8 05/02/2022   PLT 232 30/06/2329   Last metabolic panel Lab Results  Component Value Date   GLUCOSE 142 (H) 05/08/2022   NA 137 05/08/2022   K 3.7 05/08/2022   CL 101 05/08/2022   CO2 29 05/08/2022   BUN 25 (H) 05/08/2022   CREATININE 0.95 05/08/2022   GFRNONAA >60 05/08/2022   CALCIUM 9.1 05/08/2022   PROT 6.8 04/29/2022   ALBUMIN 3.3 (L) 04/29/2022   BILITOT 0.6 04/29/2022   ALKPHOS 89 04/29/2022   AST 10 (L) 04/29/2022   ALT 5 04/29/2022   ANIONGAP 7 05/08/2022    Microbiology:  Serology:  Imaging:   Assessment/plan: Problem List Items Addressed This Visit   None Visit Diagnoses     Recurrent UTI    -  Primary   Relevant Medications   methenamine (HIPREX) 1 g tablet   Other Relevant Orders   DG Pelvis 1-2 Views   CBC   COMPLETE METABOLIC PANEL WITH GFR   C-reactive protein   Urinalysis, Routine w reflex microscopic   Pneumaturia       Relevant Orders   DG Pelvis 1-2 Views   CBC   COMPLETE METABOLIC PANEL WITH GFR   C-reactive protein   Urinalysis, Routine w reflex microscopic       Discuss concept of assymptomatic bacteriuria and uti  His symptoms although atypical could be consistent with episodes of uti worsening parkinsonism. There is no intermittent offending medications that triggers these symptoms  He has consistently  grown kleb pna esbl. His current ceftriaxone and nitrofurantoin are not sensitive to this bacteria  We could try fosfamycin now and if doesn't work, will repeat  culture and see if carbapenem is needed    His story suggest that over the last couple years his parkinsonism is slowing progressive but clearly these episodes of presumed uti had demonstrated reversible brief duration of worsening parkinonism   ------------ 5/30 assessment Admitted recently to get carbapenem for presumed uti but no improvement despite cultures x2 without other resistant bacteremia This now appears to be assymptomatic bacteriuria and not uti  Can keep on prophylaxis against uti although at this time not sure if he has had recurrent uti frequently; definitely at risk for it. Can finish weekly fosfomycin then go on daily nitrofurantoin  For his groin rash which is candida intetrigo, will use hydrocortisone and lotrimin cream bid otc for 2 weeks   Follow up in 4-6 weeks  Follow up with dr Sondra Come as per her discretion    04/19/2022 assessment I was able to speak with patient's wife only I advise that someone should see the patient physically and make assessment  The patient has been having issues with mobility due to parkinson rather. We had trialed carbepenem treatment for what was growing in his urine in 02/2022 without change. At this time I am not sure what the patient's wife reports reflect uti and he should be seen   But if fever, chill, change in mentation I advise them to bring patient to ED  Again discuss urine color/odor/presence of bacteria in urine-wbc/rbc in urine by themselves do not qualify for a uti   My clinic staff will look for availability in clinic today or tomorrow for patient to be seen and see if blood work needed as well  I also advise them that if they are concerned about uti should go to [place] first   At this time can do 7 days of bid nitrofurantoin. I would avoid bactrim at this time  for many reasons and also his esbl was resistant to bactrim previously   Addendum:  I verified that I was speaking with the correct person using two identifiers. Due to the COVID-19 Pandemic/his choosing, this service was provided via telemedicine using audio/visual media.   The patient was located at home. The provider was located in the office. The patient did consent to this visit and is aware of charges through their insurance as well as the limitations of evaluation and management by telemedicine. Other persons participating in this telemedicine service were none. Time spent on visit was greater than 20 minutes on media and in coordination of care  7/12 assessment Doing well postop Reenforce need for intake of fluid and pt/ot and avoiding getting urine testing before talking to ID clinic   F/u as needed    8/30 assessment Doing well without evidence current uti on methanemine/vit c and good hydration Kleb pna colonization crhonically mdro including quinolone (mic 1 for both levo/cipro on 05/18/2022 [facility]), bactrim. S carbapenem Today walked in with cane  Other issue is HF and pending cardiac ischemia w/u with cardiology, and possible urinary stone w/u with urology  Monitor for sign of sepsis as that appears to be only tell tale sign where directed abx therapy for uti appears to be doing something positive  F/u 3 months or sooner as needed  He asked me about covid booster and despite not rigorous new science to accompany evolution of the virus, I think it is still in his favor given his advanced age, parkinsonism, and chf to get a booster  08/21/22 id assessment I will need to determine what kind  of air seen (intraluminal vs intrabladder wall) The former could be from diverticular-fistula The latter would be unusual for how he looks at this time as usually it is caused by infection  Will get xray pelvis today Urinalysis with reflex Crp/cbc/cmp  I have also asked [name]  to forward me ct scan result to compare to our xray today   If s/s concerning for infection (intramuscular air) will place on iv ertapenem     Follow-up: Return in about 1 week (around 08/28/2022).     Jabier Mutton, [facility] 08/21/2022, 3:23 PM

## 2022-08-21 NOTE — Patient Instructions (Signed)
Let's get xray pelvis today, blood and urine tests  Based on these results will see if we need to treat you for a uti  It is extremely unusual for this to be infection and you looking this good   There is sometimes gut-bladder connection that produces air. If imaging is suggestive of such then we will need a contrast study to look for this connection and discuss with GI team

## 2022-08-22 DIAGNOSIS — E7800 Pure hypercholesterolemia, unspecified: Secondary | ICD-10-CM | POA: Diagnosis not present

## 2022-08-22 DIAGNOSIS — I10 Essential (primary) hypertension: Secondary | ICD-10-CM | POA: Diagnosis not present

## 2022-08-22 DIAGNOSIS — E1165 Type 2 diabetes mellitus with hyperglycemia: Secondary | ICD-10-CM | POA: Diagnosis not present

## 2022-08-22 LAB — URINALYSIS, ROUTINE W REFLEX MICROSCOPIC
Bilirubin Urine: NEGATIVE
Hyaline Cast: NONE SEEN /LPF
Nitrite: POSITIVE — AB
Specific Gravity, Urine: 1.029 (ref 1.001–1.035)
Squamous Epithelial / HPF: NONE SEEN /HPF (ref <=5)
WBC, UA: >=60 /HPF — AB (ref 0–5)
pH: 6 (ref 5.0–8.0)

## 2022-08-22 LAB — CBC
HCT: 43.1 % (ref 38.5–50.0)
Hemoglobin: 14.6 g/dL (ref 13.2–17.1)
MCH: 30.4 pg (ref 27.0–33.0)
MCHC: 33.9 g/dL (ref 32.0–36.0)
MCV: 89.8 fL (ref 80.0–100.0)
MPV: 10 fL (ref 7.5–12.5)
Platelets: 246 10*3/uL (ref 140–400)
RBC: 4.8 10*6/uL (ref 4.20–5.80)
RDW: 12.1 % (ref 11.0–15.0)
WBC: 5.5 10*3/uL (ref 3.8–10.8)

## 2022-08-22 LAB — COMPLETE METABOLIC PANEL WITH GFR
AG Ratio: 1.6 (calc) (ref 1.0–2.5)
ALT: 4 U/L — ABNORMAL LOW (ref 9–46)
AST: 4 U/L — ABNORMAL LOW (ref 10–35)
Albumin: 4 g/dL (ref 3.6–5.1)
Alkaline phosphatase (APISO): 115 U/L (ref 35–144)
BUN/Creatinine Ratio: 25 (calc) — ABNORMAL HIGH (ref 6–22)
BUN: 32 mg/dL — ABNORMAL HIGH (ref 7–25)
CO2: 29 mmol/L (ref 20–32)
Calcium: 9.4 mg/dL (ref 8.6–10.3)
Chloride: 102 mmol/L (ref 98–110)
Creat: 1.29 mg/dL — ABNORMAL HIGH (ref 0.70–1.28)
Globulin: 2.5 g/dL (calc) (ref 1.9–3.7)
Glucose, Bld: 172 mg/dL — ABNORMAL HIGH (ref 65–99)
Potassium: 4.2 mmol/L (ref 3.5–5.3)
Sodium: 138 mmol/L (ref 135–146)
Total Bilirubin: 0.4 mg/dL (ref 0.2–1.2)
Total Protein: 6.5 g/dL (ref 6.1–8.1)
eGFR: 57 mL/min/{1.73_m2} — ABNORMAL LOW (ref >=60)

## 2022-08-22 LAB — BASIC METABOLIC PANEL
BUN/Creatinine Ratio: 24 (ref 10–24)
BUN: 31 mg/dL — ABNORMAL HIGH (ref 8–27)
CO2: 25 mmol/L (ref 20–29)
Calcium: 9.6 mg/dL (ref 8.6–10.2)
Chloride: 101 mmol/L (ref 96–106)
Creatinine, Ser: 1.27 mg/dL (ref 0.76–1.27)
Glucose: 185 mg/dL — ABNORMAL HIGH (ref 70–99)
Potassium: 4.3 mmol/L (ref 3.5–5.2)
Sodium: 140 mmol/L (ref 134–144)
eGFR: 59 mL/min/{1.73_m2} — ABNORMAL LOW (ref >59)

## 2022-08-22 LAB — C-REACTIVE PROTEIN: CRP: 0.8 mg/L (ref <8.0)

## 2022-08-23 ENCOUNTER — Telehealth: Payer: Self-pay

## 2022-08-23 ENCOUNTER — Ambulatory Visit: Payer: Medicare Other | Admitting: Physician Assistant

## 2022-08-23 NOTE — Telephone Encounter (Signed)
Patient's wife called multiple times regarding concerns.states that patient is very fatigued and has been sleeping all day for the past two days. Spoke with Dr. Gale Journey who reviewed labs. States labs are good just a little dehydrated.  Advised patient wife to make sure he is taking drinking fluids. To try something with electrolytes. Advised if he feels worse or is more concerned to go to ED.  Understands office is waiting on xray results. Spoke with radiologist at GI who will try to have results today. Dr. Gale Journey will try to call patient back later today if possible.   GI radiology line: [place], RMA

## 2022-08-23 NOTE — Telephone Encounter (Signed)
Received a call from patients wife stating that the patient is still not feeling well and he is not running a fever. Patients wife wanted to know if results are back from lab and xray. Please advise.

## 2022-08-27 DIAGNOSIS — H2512 Age-related nuclear cataract, left eye: Secondary | ICD-10-CM | POA: Diagnosis not present

## 2022-08-28 DIAGNOSIS — H2511 Age-related nuclear cataract, right eye: Secondary | ICD-10-CM | POA: Diagnosis not present

## 2022-08-31 ENCOUNTER — Ambulatory Visit: Payer: Medicare Other | Admitting: Internal Medicine

## 2022-09-02 NOTE — Progress Notes (Unsigned)
Office Visit    Patient [name] Date of Encounter: 09/03/2022  PCP:  Josetta Huddle, MD   [facility]  Cardiologist:  Candee Furbish, MD  Advanced Practice Provider:  No care team member to display Electrophysiologist:  None   HPI    [name] is a 76 y.o. male with past history significant for Parkinson's disease, insulin-dependent type 2 diabetes, hypertension, recurrent ESBL UTI, acute combined systolic and diastolic heart failure presents today for hospital follow-up.  The patient was recently in the ED 04/29/2022 with symptoms of general malaise and progressive weakness.  He was recently hospitalized 7/7 to 7/9 for the same treated with meropenem, has been weaker since then.  He was brought to the ED 7/16 with significant weakness, febrile to 101.4, UA abnormal again, chest x-ray was unremarkable, also followed by ID for recurrent ESBL UTIs.  He has a history of acute combined CHF echocardiogram showed EF 45 to 50% which is a decline from previous readings.  Cardiology was consulted at this time and was diuresed with IV Lasix, eventually transition to p.o.  Started on metoprolol, ARB, Jardiance.  Plans for an outpatient ischemic workup.  He was last seen by me 05/22/22 and he has had multiple recurrent ESBL UTIs and has been on IV antibiotics multiple times this year.  He was seeing Dr. Gloriann Loan.  He had passed a kidney stone and fractured his back.  He had episode of dehydration and was hospitalized with IV fluids.  Ultimately, an echocardiogram was performed for his weakness which showed EF 45 to 50% which was a decline from previous readings.  He was diuresed using IV Lasix and was transitioned to p.o. on discharge.  Since discharge she has been doing fairly well.  He has not had any chest pain or shortness of breath but due to his decline in EF we will plan to do an ischemic workup with coronary CTA.  We also discussed possibly transitioning to [facility] but  will readdress this after his coronary CTA.  Today, he comes in today to review his CTA results.  Since he is not having symptoms we have decided against ordering a cardiac cath at this time.  He is having eye surgery and is worried because while under medication to get the other eye fixed (cataracts) he had an episode of bradycardia, rate 21 bpm. This was quickly resolved. He otherwise had not had any symptoms of dizziness or lightheadedness. No syncope or presyncope. He has chronic lower extremity edema which has been there for years since his bilateral knee surgeries. His ASA was discontinued since he was having some bleeding in his urine. This has resolved and I have encouraged him to start back on it. Of course, if he has bleeding again he will need to discontinue.   Reports no shortness of breath nor dyspnea on exertion. Reports no chest pain, pressure, or tightness. No edema, orthopnea, PND. Reports no palpitations.    Past Medical History    Past Medical History:  Diagnosis Date   Abnormal involuntary movement 02/18/2018   Allergic rhinitis    Arthritis    BPH (benign prostatic hyperplasia)    Diabetes ([place])    Dysphagia, pharyngeal phase    GERD diagnosed on barium swallow. Has small hiatal hernia. Symptomatically somewhat better on omeprazole but not entirely. We'll try b.i.d. therapy   Edema    1+ in both ankles, likely multifactorial including medication such as Requip  Erectile dysfunction    Staxyn 10 mg or Viagra worked well. 3 samples of Cialis 20 mg provided   GERD (gastroesophageal reflux disease)    Hypercholesteremia    Hypertension    Nephrolithiasis    Onychomycosis of toenail    April 27, 2013 - Dr. Inocencio Homes - podiatry, was in [place] - treating with oral Lamisil and topical nail therapy   Parkinson's disease     followed by Dr. Lezlie Octave at [facility] and [name], M.D. in [facility]   Presbycusis    and tinnitus - Dr. Izora Gala - August/2013   Renal  calculus    Syncope    Past Surgical History:  Procedure Laterality Date   HAND SURGERY     INGUINAL HERNIA REPAIR Left 11/04/2015   Procedure: LEFT INGUINAL HERNIA REPAIR WITH MESH;  Surgeon: Armandina Gemma, MD;  Location: [place];  Service: General;  Laterality: Left;   INSERTION OF MESH Left 11/04/2015   Procedure: INSERTION OF MESH;  Surgeon: Armandina Gemma, MD;  Location: [place];  Service: General;  Laterality: Left;   JOINT REPLACEMENT Bilateral    KNEE SURGERY     TOTAL KNEE ARTHROPLASTY      Allergies  Allergies  Allergen Reactions   Nucynta [Tapentadol] Other (See Comments)    Made the patient not feel well. "He did not feel well at all."   Oxycodone Itching   Sudafed [Pseudoephedrine Hcl] Other (See Comments)    Problems urinating    Tramadol Hcl Itching and Other (See Comments)    Can tolerate, however (in 2023)    EKGs/Labs/Other Studies Reviewed:   The following studies were reviewed today:  Echocardiogram 05/04/2022  IMPRESSIONS     1. Inferior septal and apical hypokinesis . Left ventricular ejection  fraction, by estimation, is 45 to 50%. The left ventricle has mildly  decreased function. The left ventricle demonstrates regional wall motion  abnormalities (see scoring  diagram/findings for description). The left ventricular internal cavity  size was moderately dilated. There is moderate left ventricular  hypertrophy. Left ventricular diastolic parameters are indeterminate.   2. Right ventricular systolic function is normal. The right ventricular  size is normal.   3. Left atrial size was mild to moderately dilated.   4. The mitral valve is abnormal. Trivial mitral valve regurgitation. No  evidence of mitral stenosis.   5. The aortic valve is tricuspid. There is mild calcification of the  aortic valve. There is mild thickening of the aortic valve. Aortic valve  regurgitation is not visualized. Aortic valve sclerosis is  present, with  no evidence of aortic valve stenosis.   6. The inferior vena cava is dilated in size with >50% respiratory  variability, suggesting right atrial pressure of 8 mmHg.   FINDINGS   Left Ventricle: Inferior septal and apical hypokinesis. Left ventricular  ejection fraction, by estimation, is 45 to 50%. The left ventricle has  mildly decreased function. The left ventricle demonstrates regional wall  motion abnormalities. The left  ventricular internal cavity size was moderately dilated. There is moderate  left ventricular hypertrophy. Left ventricular diastolic parameters are  indeterminate.   Right Ventricle: The right ventricular size is normal. No increase in  right ventricular wall thickness. Right ventricular systolic function is  normal.   Left Atrium: Left atrial size was mild to moderately dilated.   Right Atrium: Right atrial size was normal in size.   Pericardium: There is no evidence of pericardial effusion.  Mitral Valve: The mitral valve is abnormal. There is mild thickening of  the mitral valve leaflet(s). There is mild calcification of the mitral  valve leaflet(s). Trivial mitral valve regurgitation. No evidence of  mitral valve stenosis.   Tricuspid Valve: The tricuspid valve is normal in structure. Tricuspid  valve regurgitation is mild . No evidence of tricuspid stenosis.   Aortic Valve: The aortic valve is tricuspid. There is mild calcification  of the aortic valve. There is mild thickening of the aortic valve. Aortic  valve regurgitation is not visualized. Aortic valve sclerosis is present,  with no evidence of aortic valve  stenosis. Aortic valve peak gradient measures 8.6 mmHg.   Pulmonic Valve: The pulmonic valve was normal in structure. Pulmonic valve  regurgitation is not visualized. No evidence of pulmonic stenosis.   Aorta: The aortic root is normal in size and structure.   Venous: The inferior vena cava is dilated in size with greater  than 50%  respiratory variability, suggesting right atrial pressure of 8 mmHg.   IAS/Shunts: No atrial level shunt detected by color flow Doppler.    EKG:  EKG is ordered today and showed NSR rate 70 bpm. Incomplete RBBB (chronic)   Recent Labs: 01/02/2022: Magnesium 2.0 05/07/2022: B Natriuretic Peptide 266.5 08/21/2022: ALT 4; BUN 32; Creat 1.29; Hemoglobin 14.6; Platelets 246; Potassium 4.2; Sodium 138  Recent Lipid Panel No results found for: "CHOL", "TRIG", "HDL", "CHOLHDL", "VLDL", "LDLCALC", "LDLDIRECT"  Home Medications   Current Meds  Medication Sig   acetaminophen (TYLENOL) 500 MG tablet Take 1,000 mg by mouth every 6 (six) hours as needed (for pain).   Ascorbic Acid (VITAMIN C) 100 MG tablet Take 100 mg by mouth in the morning and at bedtime.   aspirin EC 81 MG tablet Take 81 mg by mouth daily. Swallow whole.   calcium carbonate (TUMS) 500 MG chewable tablet Chew 2 tablets by mouth daily as needed for indigestion or heartburn.   carbidopa-levodopa (SINEMET IR) 25-100 MG tablet TAKE 3 TABLETS BY MOUTH AT 7:00 AM, 2 TABS AT 10:00 AM, 2 TABS AT 12:00 PM, 2 TABS AT 2:00 PM, 2 TABS AT 4:00, AND 2 TABS AT 6:00 PM.   cholecalciferol (VITAMIN D) 1000 UNITS tablet Take 2,000 Units by mouth daily.   empagliflozin (JARDIANCE) 25 MG TABS tablet Take 1 tablet (25 mg total) by mouth daily before breakfast.   entacapone (COMTAN) 200 MG tablet TAKE 1 TABLET BY MOUTH 3 TIMES DAILY (WITH FIRST THREE DOSAGES OF CARBIDOPA/LEVODOPA).   furosemide (LASIX) 20 MG tablet Take 1 tablet (20 mg total) by mouth 2 (two) times daily.   gatifloxacin (ZYMAXID) 0.5 % SOLN SMARTSIG:In Eye(s)   GLOBAL EASE INJECT PEN NEEDLES 31G X 5 MM MISC Inject into the skin.   ketorolac (ACULAR) 0.5 % ophthalmic solution SMARTSIG:In Eye(s)   LANTUS SOLOSTAR 100 UNIT/ML Solostar Pen Inject 15-20 Units into the skin at bedtime.   metFORMIN (GLUCOPHAGE) 500 MG tablet Take 500 mg by mouth 2 (two) times daily with a meal.    methenamine (HIPREX) 1 g tablet    methenamine (MANDELAMINE) 1 g tablet Take 1 tablet (1,000 mg total) by mouth 2 (two) times daily. Take with any vitamin c, twice a day for uti prevention (Patient taking differently: Take 1,000 mg by mouth in the morning and at bedtime.)   methocarbamol (ROBAXIN) 500 MG tablet Take 1 tablet by mouth 2 (two) times daily as needed.   metoprolol succinate (TOPROL-XL) 25 MG 24 hr tablet Take  1 tablet (25 mg total) by mouth daily.   omeprazole (PRILOSEC) 20 MG capsule Take 20 mg by mouth daily as needed (acid reflux).   potassium chloride (KLOR-CON) 10 MEQ tablet Take 10 mEq by mouth 2 (two) times daily.   potassium chloride SA (KLOR-CON) 20 MEQ tablet Take 10 mEq by mouth 2 (two) times daily.   prednisoLONE acetate (PRED FORTE) 1 % ophthalmic suspension SMARTSIG:In Eye(s)   rOPINIRole (REQUIP) 3 MG tablet TAKE 1 TABLET IN THE MORNING, TAKE 1 IN THE AFTERNOON, AND 1/2 TABLET IN THE EVENING   rosuvastatin (CRESTOR) 10 MG tablet Take 1 tablet (10 mg total) by mouth daily.   sacubitril-valsartan (ENTRESTO) 24-26 MG Take 1 tablet by mouth 2 (two) times daily.   tamsulosin (FLOMAX) 0.4 MG CAPS capsule Take 0.4 mg by mouth in the morning and at bedtime.   traMADol (ULTRAM) 50 MG tablet Take 1 tablet (50 mg total) by mouth every 6 (six) hours as needed (for pain).   triamcinolone (NASACORT) 55 MCG/ACT AERO nasal inhaler Place 2 sprays into the nose daily as needed (for allergies or rhinitis).   vitamin B-12 (CYANOCOBALAMIN) 1000 MCG tablet Take 1,000 mcg by mouth daily.   [DISCONTINUED] aspirin EC 81 MG tablet Take 1 tablet (81 mg total) by mouth daily. Swallow whole.   [DISCONTINUED] QUEtiapine (SEROQUEL) 25 MG tablet Take 1 tablet (25 mg total) by mouth at bedtime.     Review of Systems      All other systems reviewed and are otherwise negative except as noted above.  Physical Exam    VS:  BP 110/70 Comment: Left arm 110/60  Pulse 70   Ht 5' 11.5" (1.816 m)   Wt  196 lb 6.4 oz (89.1 kg)   SpO2 99%   BMI 27.01 kg/m  , BMI Body mass index is 27.01 kg/m.  Wt Readings from Last 3 Encounters:  09/03/22 196 lb 6.4 oz (89.1 kg)  08/21/22 191 lb 12.8 oz (87 kg)  07/02/22 192 lb (87.1 kg)     GEN: Well nourished, well developed, in no acute distress. HEENT: normal. Neck: Supple, no JVD, carotid bruits, or masses. Cardiac: RRR, no murmurs, rubs, or gallops. No clubbing, cyanosis, 2 + pitting edema on left, 1+ edema on right (chronic).  Radials/PT 2+ and equal bilaterally.  Respiratory:  Respirations regular and unlabored, clear to auscultation bilaterally. GI: Soft, nontender, nondistended. MS: No deformity or atrophy. Skin: Warm and dry, no rash. Neuro:  Strength and sensation are intact. Psych: Normal affect.  Assessment & Plan    HFmrEF  -repeat Echo March 2024     -Echocardiogram with EF 45 to 50% which has declined from previous studies -continue PO lasix BID -Continue Toprol, Entresto, Jardiance, and Crestor -passing blood when he saw Dr. Gloriann Loan so stopped ASA 81 , restart since that has resolved -Hold Metoprolol the morning of eye surgery due to episode of transient bradycardia (rate 21 bpm) -Edema the same it has been since his bilateral knee surgeries -daily weights -If he gains more than 3 lbs overnight or 5 lbs in a week, please call the office  HTN -currently on Entresto and Toprol XL and well controlled -BP cuff was tested today and running 59mHg below our cuff -well controlled today in the clinic  Diabetes mellitus -continue current medication regimen -A1C 6.4  Recurrent ESBL UTI -per primary, this is his main concern -He has an appointment with Dr. BGloriann Loanand follows ID outpatient     Disposition:  Follow up after 6 months with Candee Furbish, MD or APP.  Signed, Elgie Collard, PA-C 09/03/2022, 3:35 PM [facility]

## 2022-09-03 ENCOUNTER — Ambulatory Visit: Payer: Medicare Other | Attending: Physician Assistant | Admitting: Physician Assistant

## 2022-09-03 ENCOUNTER — Encounter: Payer: Self-pay | Admitting: Physician Assistant

## 2022-09-03 VITALS — BP 110/70 | HR 70 | Ht 71.5 in | Wt 196.4 lb

## 2022-09-03 DIAGNOSIS — I5022 Chronic systolic (congestive) heart failure: Secondary | ICD-10-CM

## 2022-09-03 DIAGNOSIS — E119 Type 2 diabetes mellitus without complications: Secondary | ICD-10-CM

## 2022-09-03 DIAGNOSIS — N390 Urinary tract infection, site not specified: Secondary | ICD-10-CM

## 2022-09-03 DIAGNOSIS — I10 Essential (primary) hypertension: Secondary | ICD-10-CM | POA: Diagnosis not present

## 2022-09-03 NOTE — Patient Instructions (Addendum)
Medication Instructions:  1.Restart Aspirin 81 mg daily 2.Do not take metoprolol succinate on the morning of your eye surgery *If you need a refill on your cardiac medications before your next appointment, please call your pharmacy*   Lab Work: None If you have labs (blood work) drawn today and your tests are completely normal, you will receive your results only by: [place] (if you have MyChart) OR A paper copy in the mail If you have any lab test that is abnormal or we need to change your treatment, we will call you to review the results.   Testing/Procedures: Keep ECHO as scheduled 01/02/2023 at 10:30 AM   Follow-Up: At [facility], you and your health needs are our priority.  As part of our continuing mission to provide you with exceptional heart care, we have created designated Provider Care Teams.  These Care Teams include your primary Cardiologist (physician) and Advanced Practice Providers (APPs -  Physician Assistants and Nurse Practitioners) who all work together to provide you with the care you need, when you need it.  Your next appointment:   6 month(s)  The format for your next appointment:   In Person  Provider:   Candee Furbish, MD   Important Information About Sugar

## 2022-09-05 ENCOUNTER — Ambulatory Visit: Payer: Medicare Other | Admitting: Internal Medicine

## 2022-09-17 DIAGNOSIS — H2511 Age-related nuclear cataract, right eye: Secondary | ICD-10-CM | POA: Diagnosis not present

## 2022-09-25 NOTE — Progress Notes (Unsigned)
Assessment/Plan:   1.  Parkinsons Disease, diagnosed 2010 with symptoms to 2008  -continue carbidopa/levodopa 25/100, 3 po qid  -Continue entacapone 200 mg with first three dosages of levodopa   -For now, we will continue ropinirole 3 mg, 1/1/0.5  -Discussed with the patient that the best thing that he could do would be to use his walker at all times.  We also discussed the value of intensive outpatient physical therapy.  He does not want to do either of those things.  He had some home physical therapy, which is not nearly as intensive.  In addition, I am very worried about the stairs in his home.  He had a significant fall yesterday, breaking the sheet rock.  Discussed that the fall could have been even more serious and he could have had a brain bleed or hip fracture.  Discussed that I really do not want him using the stairs, unless he has a stair lift.  He was resistant.  2.  History of UTI worsening parkinsonism  -Following with infectious disease closely  -follows with urology for nephrolithiasis   Subjective:   [name] was seen today in follow up for Parkinsons disease.  My previous records were reviewed prior to todays visit as well as outside records available to me.  Patient has continued to struggle with urinary tract infections.  Was in the hospital twice in July, 1 with a fairly prolonged admission (was in the hospital most of July).  He was discharged to skilled nursing facility July 25.  He had also been dealing with kidney stones.  He has been following closely with infectious disease.  He was last seen November 7.  At that point in time, labs look good, although the patient did not necessarily feel well.  He was seen by cardiology PA November 20.  Last visit, we talked about using a walker at all times, as he had had some serious falls.  We also strongly encouraged intensive outpatient physical therapy, as opposed to home physical therapy, but unfortunately he did not want to  do that.  He ***    Current prescribed movement disorder medications: carbidopa/levodopa 25/100, 3 tablets qid Entacapone, 200 mg with first 3 dosages of levodopa  requip 3 mg, 1 tablet in the morning, 1 in the afternoon, half tablet in the evening    PREVIOUS MEDICATIONS: neupro (helped but costly); requip; levodopa IR; amantadine (d/c when confused)  ALLERGIES:   Allergies  Allergen Reactions   Nucynta [Tapentadol] Other (See Comments)    Made the patient not feel well. "He did not feel well at all."   Oxycodone Itching   Sudafed [Pseudoephedrine Hcl] Other (See Comments)    Problems urinating    Tramadol Hcl Itching and Other (See Comments)    Can tolerate, however (in 2023)    CURRENT MEDICATIONS:  Outpatient Encounter Medications as of 09/26/2022  Medication Sig   acetaminophen (TYLENOL) 500 MG tablet Take 1,000 mg by mouth every 6 (six) hours as needed (for pain).   Ascorbic Acid (VITAMIN C) 100 MG tablet Take 100 mg by mouth in the morning and at bedtime.   aspirin EC 81 MG tablet Take 81 mg by mouth daily. Swallow whole.   calcium carbonate (TUMS) 500 MG chewable tablet Chew 2 tablets by mouth daily as needed for indigestion or heartburn.   carbidopa-levodopa (SINEMET IR) 25-100 MG tablet TAKE 3 TABLETS BY MOUTH AT 7:00 AM, 2 TABS AT 10:00 AM, 2 TABS AT  12:00 PM, 2 TABS AT 2:00 PM, 2 TABS AT 4:00, AND 2 TABS AT 6:00 PM.   cholecalciferol (VITAMIN D) 1000 UNITS tablet Take 2,000 Units by mouth daily.   empagliflozin (JARDIANCE) 25 MG TABS tablet Take 1 tablet (25 mg total) by mouth daily before breakfast.   entacapone (COMTAN) 200 MG tablet TAKE 1 TABLET BY MOUTH 3 TIMES DAILY (WITH FIRST THREE DOSAGES OF CARBIDOPA/LEVODOPA).   furosemide (LASIX) 20 MG tablet Take 1 tablet (20 mg total) by mouth 2 (two) times daily.   gatifloxacin (ZYMAXID) 0.5 % SOLN SMARTSIG:In Eye(s)   GLOBAL EASE INJECT PEN NEEDLES 31G X 5 MM MISC Inject into the skin.   ketorolac (ACULAR) 0.5 %  ophthalmic solution SMARTSIG:In Eye(s)   LANTUS SOLOSTAR 100 UNIT/ML Solostar Pen Inject 15-20 Units into the skin at bedtime.   metFORMIN (GLUCOPHAGE) 500 MG tablet Take 500 mg by mouth 2 (two) times daily with a meal.   methenamine (HIPREX) 1 g tablet    methenamine (MANDELAMINE) 1 g tablet Take 1 tablet (1,000 mg total) by mouth 2 (two) times daily. Take with any vitamin c, twice a day for uti prevention (Patient taking differently: Take 1,000 mg by mouth in the morning and at bedtime.)   methocarbamol (ROBAXIN) 500 MG tablet Take 1 tablet by mouth 2 (two) times daily as needed.   metoprolol succinate (TOPROL-XL) 25 MG 24 hr tablet Take 1 tablet (25 mg total) by mouth daily.   omeprazole (PRILOSEC) 20 MG capsule Take 20 mg by mouth daily as needed (acid reflux).   potassium chloride (KLOR-CON) 10 MEQ tablet Take 10 mEq by mouth 2 (two) times daily.   potassium chloride SA (KLOR-CON) 20 MEQ tablet Take 10 mEq by mouth 2 (two) times daily.   prednisoLONE acetate (PRED FORTE) 1 % ophthalmic suspension SMARTSIG:In Eye(s)   rOPINIRole (REQUIP) 3 MG tablet TAKE 1 TABLET IN THE MORNING, TAKE 1 IN THE AFTERNOON, AND 1/2 TABLET IN THE EVENING   rosuvastatin (CRESTOR) 10 MG tablet Take 1 tablet (10 mg total) by mouth daily.   sacubitril-valsartan (ENTRESTO) 24-26 MG Take 1 tablet by mouth 2 (two) times daily.   tamsulosin (FLOMAX) 0.4 MG CAPS capsule Take 0.4 mg by mouth in the morning and at bedtime.   traMADol (ULTRAM) 50 MG tablet Take 1 tablet (50 mg total) by mouth every 6 (six) hours as needed (for pain).   triamcinolone (NASACORT) 55 MCG/ACT AERO nasal inhaler Place 2 sprays into the nose daily as needed (for allergies or rhinitis).   vitamin B-12 (CYANOCOBALAMIN) 1000 MCG tablet Take 1,000 mcg by mouth daily.   No facility-administered encounter medications on file as of 09/26/2022.    Objective:   PHYSICAL EXAMINATION:    VITALS:   There were no vitals filed for this visit.    Wt  Readings from Last 3 Encounters:  09/03/22 196 lb 6.4 oz (89.1 kg)  08/21/22 191 lb 12.8 oz (87 kg)  07/02/22 192 lb (87.1 kg)    GEN:  The patient appears stated age and is in NAD. HEENT:  Normocephalic, atraumatic.  The mucous membranes are moist. The superficial temporal arteries are without ropiness or tenderness. CV:  RRR Lungs:  CTAB Neck/HEME:  There are no carotid bruits bilaterally.   Neurological examination:  Orientation: The patient is alert and oriented x3. Cranial nerves: There is good facial symmetry with min facial hypomimia. The speech is fluent and clear. Soft palate rises symmetrically and there is no tongue deviation. Hearing  is intact to conversational tone. Sensation: Sensation is intact to light touch throughout Motor: Strength is at least antigravity x4.  Movement examination: Tone: There is nl tone in the UE/LE. Abnormal movements: mild dyskinesia of the R leg Coordination:  There is minimal slowness with finger taps bilaterally.  Otherwise, rapid alternating movements are good. Gait and Station: The patient has no difficulty arising out of a deep-seated chair without the use of the hands.  He is forward flexed and short stepped.  He ambulates with a cane.  He brings a walker, but does not use it.    I have reviewed and interpreted the following labs independently    Chemistry      Component Value Date/Time   NA 138 08/21/2022 1558   NA 140 08/21/2022 1444   K 4.2 08/21/2022 1558   CL 102 08/21/2022 1558   CO2 29 08/21/2022 1558   BUN 32 (H) 08/21/2022 1558   BUN 31 (H) 08/21/2022 1444   CREATININE 1.29 (H) 08/21/2022 1558   CREATININE 1.27 08/21/2022 1444      Component Value Date/Time   CALCIUM 9.4 08/21/2022 1558   ALKPHOS 89 04/29/2022 1640   AST 4 (L) 08/21/2022 1558   ALT 4 (L) 08/21/2022 1558   BILITOT 0.4 08/21/2022 1558       Lab Results  Component Value Date   WBC 5.5 08/21/2022   HGB 14.6 08/21/2022   HCT 43.1 08/21/2022    MCV 89.8 08/21/2022   PLT 246 08/21/2022    No results found for: "TSH"   Total time spent on today's visit was *** minutes, including both face-to-face time and nonface-to-face time.  Time included that spent on review of records (prior notes available to me/labs/imaging if pertinent), discussing treatment and goals, answering patient's questions and coordinating care.  Cc:  Josetta Huddle, MD

## 2022-09-26 ENCOUNTER — Encounter: Payer: Self-pay | Admitting: Neurology

## 2022-09-26 ENCOUNTER — Ambulatory Visit (INDEPENDENT_AMBULATORY_CARE_PROVIDER_SITE_OTHER): Payer: Medicare Other | Admitting: Neurology

## 2022-09-26 ENCOUNTER — Other Ambulatory Visit: Payer: Self-pay

## 2022-09-26 VITALS — BP 115/72 | HR 72 | Ht 72.0 in | Wt 198.0 lb

## 2022-09-26 DIAGNOSIS — G20A1 Parkinson's disease without dyskinesia, without mention of fluctuations: Secondary | ICD-10-CM | POA: Diagnosis not present

## 2022-09-26 DIAGNOSIS — G20B2 Parkinson's disease with dyskinesia, with fluctuations: Secondary | ICD-10-CM

## 2022-09-26 MED ORDER — CARBIDOPA-LEVODOPA 25-100 MG PO TABS: ORAL_TABLET | ORAL | 0 refills | Status: DC

## 2022-09-26 MED ORDER — INBRIJA 42 MG IN CAPS: ORAL_CAPSULE | RESPIRATORY_TRACT | 0 refills | Status: DC

## 2022-09-26 NOTE — Patient Instructions (Addendum)
We are going to start inbrija.  Remember that TWO capsules is ONE dosage (never inhale just one capsule).  You can inhale the capsules as needed up to 5 times per day, separated by 2 hour intervals.  Many patients use this right when the wake up to help with first morning "on" and then as needed during the day.  You should take a sip of water prior to using the inhaler to avoid side effects.  It may generate some cough right when you use it and that is normal.  There are nurse educators available to help you with this device.  You can call [phone] and they will set you up with a nurse educator to assist you for free of charge.  They are available 8am-8pm Monday-Friday.  Local and Online Resources for Power over Parkinson's Group  December 2023    LOCAL [place] PARKINSON'S GROUPS   Power over Parkinson's Group:    Power Over Parkinson's Patient Education Group will be Wednesday, December 13th-*Hybrid meting*- in person at [facility] location and via [facility], 2:00-3:00 pm.   Starting in November, Power over [employer] and Care Partner Groups will meet together, with plans for separate break out session for caregivers (*this will be evolving over the next few months) Upcoming Power over Parkinson's Meetings/Care Partner Support:  2nd Wednesdays of the month at 2 pm:   December 13th, January 10th  [place] at [email or web address] if interested in participating in this group    [place] U Party!  Wednesday, December 6th, 4:00-5:00 pm.  [facility].  RSVP to Garnetta Buddy at [phone] or [email or web address] New PWR! Moves Dynegy Instructor-Led Classes offering at [employer]!  TUESDAYS and Wednesdays 1-2 pm.   Contact Vonna Kotyk at  [email or web address]  or [phone] (Tuesday classes are modified for chair and standing only) Dance for Parkinson 's classes will be on  Tuesdays 9:30am-10:30am starting October 3-December 12 with a break the week of November 21st. Located in the [employer] , in the first floor of the [employer] ([place].) To register:  [email or web address] or [phone]  Drumming for Parkinson's will be held on 2nd and 4th Mondays at 11:00 am.   Located at the [place] ([place]  [place] at [email or web address] or [phone]  Through support from the [place] for Parkinson's classes are free for both patients and caregivers.    [employer] Parkinson's Tai Chi Class, Mondays at 11 am.  Call [phone] for details   [place]:  [email or web address]  PD Health at Home continues:  Mindfulness Mondays, Wellness Wednesdays, Fitness Fridays   Upcoming Education:    Eating and Feeling Well through the [place]. Wednesday, Dec. 6th,  1-2 pm  Hospital Safety.  Wednesday, Dec. 13th, 1-2 pm Register for expert briefings (webinars) at [email or web address]  Please check out their website to sign up for emails and see their full online offerings      [place]:  [email or web address]   Third Thursday Webinars:  On the third Thursday of every month at 12 p.m. ET, join our free live webinars to learn about various aspects of living with Parkinson's disease and our work to speed medical breakthroughs.  Upcoming Webinar:  Tools for Diagnosing and Visualizing Parkinson's Disease.  Thursday, December 21st at 12 noon. Check out additional information on their  website to see their full online offerings    [employer]:  [email or web address]  Upcoming Webinar:   Stay tuned  Webinar Series:  Living with Parkinson's Meetup.   Third Thursdays each month, 3 pm  Care Partner  Monthly Meetup.  With Robin Searing Phinney.  First Tuesday of each month, 2 pm  Check out additional information to Live Well Today on their website    Parkinson and Movement Disorders (PMD) Alliance:  www.[email or web address]  [employer]:  Online Education Events  Sign up for emails, which are sent weekly to give you updates on programming and online offerings    Parkinson's Association of the [place]:  www.parkinsonassociation.org  Information on online support groups, education events, and online exercises including Yoga, Parkinson's exercises and more-LOTS of information on links to PD resources and online events  Virtual Support Group through Parkinson's Association of the [place]; next one is scheduled for Wednesday, December 6th  at 2 pm.  (These are typically scheduled for the 1st Wednesday of the month at 2 pm).  Visit website for details.   MOVEMENT AND EXERCISE OPPORTUNITIES  PWR! Moves Classes at [place].  Wednesdays 10 and 11 am.   Contact Amy Marriott, PT [email or web address] if interested.  NEW PWR! Moves Class offerings at [employer].  *TUESDAYS* and Wednesdays 1-2 pm.    Contact Vonna Kotyk at  [email or web address]    Parkinson's Wellness Recovery (PWR! Moves)  www.[email or web address]  Info on the PWR! Virtual Experience:  You will have access to our expertise?through self-assessment, guided plans that start with the PD-specific fundamentals, educational content, tips, Q&A with an expert, and a growing Art therapist of PD-specific pre-recorded and live exercise classes of varying types and intensity - both physical and cognitive! If that is not enough, we offer 1:1 wellness consultations (in-person or virtual) to personalize your PWR! Research scientist (medical).   [place] Fridays:   As part of the PD Health @ Home program, this free video series focuses each week on one aspect of fitness designed to support people living with  Parkinson's.? These weekly videos highlight the [place] fitness guidelines for people with Parkinson's disease.  [email or web address]   Dance for PD website is offering free, live-stream classes throughout the week, as well as links to [employer] of classes:  [email or web address].[email or web address]/  Virtual dance and Pilates for Parkinson's classes: Click on the Community Tab> Parkinson's Movement Initiative Tab.  To register for classes and for more information, visit [email or web address] and click the "community" tab.   [employer] Parkinson's Cycling Classes   [employer]:  Thursdays @ Noon-Live classes at [employer] ([employer] at [email or web address]?or [phone])  [employer]: Virtual Classes Mondays and Thursdays Jeanette Caprice classes Tuesday, Wednesday and Thursday (contact [place] at [email or web address] ?or [phone])  [place]  Varied levels of classes are offered Tuesdays and Thursdays at [employer].   Stretching with Verdis Frederickson weekly class is also offered for people with Parkinson's  To observe a class or for more information, call [phone] or email Hezzie Bump at [email or web address]   ADDITIONAL SUPPORT AND RESOURCES  [employer]:Online Caregiver Education Opportunities:  www.well-springsolutions.org/caregiver-education/caregiver-support-group.  You may also contact Vickki Muff at [email or web address] or [phone].     Well-Spring Navigator:  Just1Navigator program, a?free service to help individuals and families through the journey of determining care for older adults.  The "Navigator" is a Education officer, museum, Arnell Asal, who will speak with a prospective client and/or loved ones to provide an assessment of the situation  and a set of recommendations for a personalized care plan -- all free of charge, and whether?Well[employer]Spring [employer]  offers the needed service or not. If the need is not a service we provide, we are well-connected with reputable programs in town that we can refer you to.  www.well-springsolutions.org or to speak with the Navigator, call [phone].

## 2022-10-03 ENCOUNTER — Other Ambulatory Visit: Payer: Self-pay | Admitting: Neurology

## 2022-10-31 DIAGNOSIS — M6281 Muscle weakness (generalized): Secondary | ICD-10-CM | POA: Diagnosis not present

## 2022-10-31 DIAGNOSIS — S32010D Wedge compression fracture of first lumbar vertebra, subsequent encounter for fracture with routine healing: Secondary | ICD-10-CM | POA: Diagnosis not present

## 2022-10-31 DIAGNOSIS — R293 Abnormal posture: Secondary | ICD-10-CM | POA: Diagnosis not present

## 2022-10-31 DIAGNOSIS — M5459 Other low back pain: Secondary | ICD-10-CM | POA: Diagnosis not present

## 2022-11-02 DIAGNOSIS — S32010D Wedge compression fracture of first lumbar vertebra, subsequent encounter for fracture with routine healing: Secondary | ICD-10-CM | POA: Diagnosis not present

## 2022-11-02 DIAGNOSIS — M6281 Muscle weakness (generalized): Secondary | ICD-10-CM | POA: Diagnosis not present

## 2022-11-02 DIAGNOSIS — R293 Abnormal posture: Secondary | ICD-10-CM | POA: Diagnosis not present

## 2022-11-02 DIAGNOSIS — M5459 Other low back pain: Secondary | ICD-10-CM | POA: Diagnosis not present

## 2022-11-03 ENCOUNTER — Other Ambulatory Visit: Payer: Self-pay | Admitting: Neurology

## 2022-11-03 DIAGNOSIS — G20A1 Parkinson's disease without dyskinesia, without mention of fluctuations: Secondary | ICD-10-CM

## 2022-11-05 ENCOUNTER — Telehealth: Payer: Self-pay | Admitting: Neurology

## 2022-11-05 DIAGNOSIS — R293 Abnormal posture: Secondary | ICD-10-CM | POA: Diagnosis not present

## 2022-11-05 DIAGNOSIS — M6281 Muscle weakness (generalized): Secondary | ICD-10-CM | POA: Diagnosis not present

## 2022-11-05 DIAGNOSIS — S32010D Wedge compression fracture of first lumbar vertebra, subsequent encounter for fracture with routine healing: Secondary | ICD-10-CM | POA: Diagnosis not present

## 2022-11-05 DIAGNOSIS — M5459 Other low back pain: Secondary | ICD-10-CM | POA: Diagnosis not present

## 2022-11-05 NOTE — Telephone Encounter (Signed)
Called patient verified medication

## 2022-11-05 NOTE — Telephone Encounter (Signed)
[name] is returning a call to [employer]

## 2022-11-15 DIAGNOSIS — M5459 Other low back pain: Secondary | ICD-10-CM | POA: Diagnosis not present

## 2022-11-15 DIAGNOSIS — M6281 Muscle weakness (generalized): Secondary | ICD-10-CM | POA: Diagnosis not present

## 2022-11-15 DIAGNOSIS — R293 Abnormal posture: Secondary | ICD-10-CM | POA: Diagnosis not present

## 2022-11-15 DIAGNOSIS — S32010D Wedge compression fracture of first lumbar vertebra, subsequent encounter for fracture with routine healing: Secondary | ICD-10-CM | POA: Diagnosis not present

## 2022-11-20 DIAGNOSIS — S32010D Wedge compression fracture of first lumbar vertebra, subsequent encounter for fracture with routine healing: Secondary | ICD-10-CM | POA: Diagnosis not present

## 2022-11-20 DIAGNOSIS — M6281 Muscle weakness (generalized): Secondary | ICD-10-CM | POA: Diagnosis not present

## 2022-11-20 DIAGNOSIS — R293 Abnormal posture: Secondary | ICD-10-CM | POA: Diagnosis not present

## 2022-11-20 DIAGNOSIS — M5459 Other low back pain: Secondary | ICD-10-CM | POA: Diagnosis not present

## 2022-11-21 DIAGNOSIS — I5040 Unspecified combined systolic (congestive) and diastolic (congestive) heart failure: Secondary | ICD-10-CM | POA: Diagnosis not present

## 2022-11-21 DIAGNOSIS — E1169 Type 2 diabetes mellitus with other specified complication: Secondary | ICD-10-CM | POA: Diagnosis not present

## 2022-11-21 DIAGNOSIS — K219 Gastro-esophageal reflux disease without esophagitis: Secondary | ICD-10-CM | POA: Diagnosis not present

## 2022-11-21 DIAGNOSIS — F5101 Primary insomnia: Secondary | ICD-10-CM | POA: Diagnosis not present

## 2022-11-21 DIAGNOSIS — N1831 Chronic kidney disease, stage 3a: Secondary | ICD-10-CM | POA: Diagnosis not present

## 2022-11-21 DIAGNOSIS — I10 Essential (primary) hypertension: Secondary | ICD-10-CM | POA: Diagnosis not present

## 2022-11-21 DIAGNOSIS — E1165 Type 2 diabetes mellitus with hyperglycemia: Secondary | ICD-10-CM | POA: Diagnosis not present

## 2022-11-21 DIAGNOSIS — G20A1 Parkinson's disease without dyskinesia, without mention of fluctuations: Secondary | ICD-10-CM | POA: Diagnosis not present

## 2022-11-21 DIAGNOSIS — E7800 Pure hypercholesterolemia, unspecified: Secondary | ICD-10-CM | POA: Diagnosis not present

## 2022-11-21 DIAGNOSIS — Z0000 Encounter for general adult medical examination without abnormal findings: Secondary | ICD-10-CM | POA: Diagnosis not present

## 2022-11-22 ENCOUNTER — Other Ambulatory Visit: Payer: Self-pay | Admitting: Neurology

## 2022-11-22 DIAGNOSIS — G20B1 Parkinson's disease with dyskinesia, without mention of fluctuations: Secondary | ICD-10-CM

## 2023-01-02 ENCOUNTER — Encounter: Payer: Self-pay | Admitting: Internal Medicine

## 2023-01-02 ENCOUNTER — Ambulatory Visit (HOSPITAL_COMMUNITY): Payer: Medicare Other | Attending: Cardiovascular Disease

## 2023-01-02 ENCOUNTER — Ambulatory Visit (INDEPENDENT_AMBULATORY_CARE_PROVIDER_SITE_OTHER): Payer: Medicare Other | Admitting: Internal Medicine

## 2023-01-02 VITALS — BP 103/60 | HR 60 | Ht 72.0 in

## 2023-01-02 DIAGNOSIS — I5020 Unspecified systolic (congestive) heart failure: Secondary | ICD-10-CM | POA: Diagnosis not present

## 2023-01-02 DIAGNOSIS — R002 Palpitations: Secondary | ICD-10-CM | POA: Insufficient documentation

## 2023-01-02 DIAGNOSIS — Z79899 Other long term (current) drug therapy: Secondary | ICD-10-CM

## 2023-01-02 DIAGNOSIS — G20B1 Parkinson's disease with dyskinesia, without mention of fluctuations: Secondary | ICD-10-CM

## 2023-01-02 DIAGNOSIS — Z01812 Encounter for preprocedural laboratory examination: Secondary | ICD-10-CM | POA: Diagnosis not present

## 2023-01-02 DIAGNOSIS — R931 Abnormal findings on diagnostic imaging of heart and coronary circulation: Secondary | ICD-10-CM | POA: Insufficient documentation

## 2023-01-02 LAB — BASIC METABOLIC PANEL
BUN/Creatinine Ratio: 19 (ref 10–24)
BUN: 22 mg/dL (ref 8–27)
CO2: 28 mmol/L (ref 20–29)
Calcium: 9.8 mg/dL (ref 8.6–10.2)
Chloride: 101 mmol/L (ref 96–106)
Creatinine, Ser: 1.13 mg/dL (ref 0.76–1.27)
Glucose: 120 mg/dL — ABNORMAL HIGH (ref 70–99)
Potassium: 4.2 mmol/L (ref 3.5–5.2)
Sodium: 136 mmol/L (ref 134–144)
eGFR: 67 mL/min/{1.73_m2} (ref >59)

## 2023-01-02 LAB — CBC
Hematocrit: 46.8 % (ref 37.5–51.0)
Hemoglobin: 15.6 g/dL (ref 13.0–17.7)
MCH: 30.3 pg (ref 26.6–33.0)
MCHC: 33.3 g/dL (ref 31.5–35.7)
MCV: 91 fL (ref 79–97)
Platelets: 222 10*3/uL (ref 150–450)
RBC: 5.15 x10E6/uL (ref 4.14–5.80)
RDW: 14.2 % (ref 11.6–15.4)
WBC: 7.8 10*3/uL (ref 3.4–10.8)

## 2023-01-02 LAB — ECHOCARDIOGRAM COMPLETE
Area-P 1/2: 2.39 cm2
S' Lateral: 3.7 cm

## 2023-01-02 LAB — MAGNESIUM: Magnesium: 2.2 mg/dL (ref 1.6–2.3)

## 2023-01-02 MED ORDER — FUROSEMIDE 20 MG PO TABS: 40.0000 mg | ORAL_TABLET | Freq: Every day | ORAL | 3 refills | Status: DC

## 2023-01-02 MED ORDER — METOPROLOL SUCCINATE ER 50 MG PO TB24: 50.0000 mg | ORAL_TABLET | Freq: Every day | ORAL | 3 refills | Status: DC

## 2023-01-02 NOTE — Progress Notes (Signed)
Patient Care Team: Kathalene Frames, MD as PCP - General (Internal Medicine) Jerline Pain, MD as PCP - Cardiology (Cardiology) [name], [name], DO as Consulting Physician (Neurology)   HPI  [name] is a 77 y.o. male seen as an add-on today because of significant symptomatic ectopy identified during his echocardiogram.  This has been scheduled 9/23 following the introduction of Entresto for his midrange depressed left ventricular function.  He has noted palpitations for the last couple of months.  They are associated with some shortness of breath perhaps some mild lightheadedness and particularly in the morning.  His wife has not noted any significant change in functional status  Denies chest pain  DATE TEST EF    7/23 Echo   40-45 %   8/23 CTA  RCA FFR 0.8/0.67 FFR LAD0.88/0.56 FFR LCX0.75        Date Cr K Hgb  11/23 1.29 4.2 14.6          Records and Results Reviewed   Past Medical History:  Diagnosis Date   Abnormal involuntary movement 02/18/2018   Allergic rhinitis    Arthritis    BPH (benign prostatic hyperplasia)    Diabetes ([place])    Dysphagia, pharyngeal phase    GERD diagnosed on barium swallow. Has small hiatal hernia. Symptomatically somewhat better on omeprazole but not entirely. We'll try b.i.d. therapy   Edema    1+ in both ankles, likely multifactorial including medication such as Requip   Erectile dysfunction    Staxyn 10 mg or Viagra worked well. 3 samples of Cialis 20 mg provided   GERD (gastroesophageal reflux disease)    Hypercholesteremia    Hypertension    Nephrolithiasis    Onychomycosis of toenail    April 27, 2013 - Dr. Inocencio Homes - podiatry, was in [place] - treating with oral Lamisil and topical nail therapy   Parkinson's disease     followed by Dr. Lezlie Octave at [facility] and [name], M.D. in [place]   Presbycusis    and tinnitus - Dr. Izora Gala - August/2013   Renal calculus    Syncope     Past Surgical  History:  Procedure Laterality Date   HAND SURGERY     INGUINAL HERNIA REPAIR Left 11/04/2015   Procedure: LEFT INGUINAL HERNIA REPAIR WITH MESH;  Surgeon: Armandina Gemma, MD;  Location: [facility];  Service: General;  Laterality: Left;   INSERTION OF MESH Left 11/04/2015   Procedure: INSERTION OF MESH;  Surgeon: Armandina Gemma, MD;  Location: [facility];  Service: General;  Laterality: Left;   JOINT REPLACEMENT Bilateral    KNEE SURGERY     TOTAL KNEE ARTHROPLASTY      Current Meds  Medication Sig   metoprolol succinate (TOPROL-XL) 50 MG 24 hr tablet Take 1 tablet (50 mg total) by mouth daily. Take with or immediately following a meal.    Allergies  Allergen Reactions   Nucynta [Tapentadol] Other (See Comments)    Made the patient not feel well. "He did not feel well at all."   Oxycodone Itching   Sudafed [Pseudoephedrine Hcl] Other (See Comments)    Problems urinating    Tramadol Hcl Itching and Other (See Comments)    Can tolerate, however (in 2023)      Review of Systems negative except from HPI and PMH  Physical Exam BP 103/60   Pulse 60   Ht 6' (1.829 m)  SpO2 93%   BMI 26.85 kg/m  Well developed and well nourished in no acute distress HENT normal E scleral and icterus clear Neck Supple JVP flat; carotids brisk and full Clear to ausculation IRRRegular rate and rhythm, no murmurs gallops or rub Soft with active bowel sounds No clubbing cyanosis  Edema Alert and oriented, grossly normal motor and sensory function Skin Warm and Dry  ECG sinus with frequent PVCs right bundle indeterminate axis  CrCl cannot be calculated (Patient's most recent lab result is older than the maximum 21 days allowed.).   Assessment and  Plan  Cardiomyopathy  Coronary artery disease with positive FFR  PVCs-frequent  Parkinson's disease   Patient has PVCs that are symptomatic brought into attention today.  In the context of his known mild left  ventricular dysfunction (echo pending) is known abnormal FFR, discussed with Dr. Marlou Porch we will proceed with cardiac catheterization.  Risks and benefits have been reviewed.  For now we will increase his metoprolol from 25-50, we will resume low-dose aspirin 81 mg and begin him on atorvastatin 10 mg starting low just because he takes multiple medications I worry about tolerance.  Further therapies regarding the PVCs would depend on left ventricular dysfunction and the degree of symptoms.  In the context of his left ventricular dysfunction known coronary artery disease I would probably opt for dronaderone at first blush   Current medicines are reviewed at length with the patient today .  The patient does not  have concerns regarding medicines.

## 2023-01-02 NOTE — H&P (View-Only) (Signed)
      Patient Care Team: Henderson, Jonathan A, MD as PCP - General (Internal Medicine) Skains, Mark C, MD as PCP - Cardiology (Cardiology) [name], Rebecca S, DO as Consulting Physician (Neurology)   HPI  [name] is a 77 y.o. male seen as an add-on today because of significant symptomatic ectopy identified during his echocardiogram.  This has been scheduled 9/23 following the introduction of Entresto for his midrange depressed left ventricular function.  He has noted palpitations for the last couple of months.  They are associated with some shortness of breath perhaps some mild lightheadedness and particularly in the morning.  His wife has not noted any significant change in functional status  Denies chest pain  DATE TEST EF    7/23 Echo   40-45 %   8/23 CTA  RCA FFR 0.8/0.67 FFR LAD0.88/0.56 FFR LCX0.75        Date Cr K Hgb  11/23 1.29 4.2 14.6          Records and Results Reviewed   Past Medical History:  Diagnosis Date   Abnormal involuntary movement 02/18/2018   Allergic rhinitis    Arthritis    BPH (benign prostatic hyperplasia)    Diabetes ([facility])    Dysphagia, pharyngeal phase    GERD diagnosed on barium swallow. Has small hiatal hernia. Symptomatically somewhat better on omeprazole but not entirely. We'll try b.i.d. therapy   Edema    1+ in both ankles, likely multifactorial including medication such as Requip   Erectile dysfunction    Staxyn 10 mg or Viagra worked well. 3 samples of Cialis 20 mg provided   GERD (gastroesophageal reflux disease)    Hypercholesteremia    Hypertension    Nephrolithiasis    Onychomycosis of toenail    April 27, 2013 - Dr. John Petery - podiatry, was in [place] - treating with oral Lamisil and topical nail therapy   Parkinson's disease     followed by Dr. Nina Browner at [facility] and Keith Willis, M.D. in [place]   Presbycusis    and tinnitus - Dr. Jefry Rosen - August/2013   Renal calculus    Syncope     Past Surgical  History:  Procedure Laterality Date   HAND SURGERY     INGUINAL HERNIA REPAIR Left 11/04/2015   Procedure: LEFT INGUINAL HERNIA REPAIR WITH MESH;  Surgeon: Todd Gerkin, MD;  Location: [facility];  Service: General;  Laterality: Left;   INSERTION OF MESH Left 11/04/2015   Procedure: INSERTION OF MESH;  Surgeon: Todd Gerkin, MD;  Location:  SURGERY CENTER;  Service: General;  Laterality: Left;   JOINT REPLACEMENT Bilateral    KNEE SURGERY     TOTAL KNEE ARTHROPLASTY      Current Meds  Medication Sig   metoprolol succinate (TOPROL-XL) 50 MG 24 hr tablet Take 1 tablet (50 mg total) by mouth daily. Take with or immediately following a meal.    Allergies  Allergen Reactions   Nucynta [Tapentadol] Other (See Comments)    Made the patient not feel well. "He did not feel well at all."   Oxycodone Itching   Sudafed [Pseudoephedrine Hcl] Other (See Comments)    Problems urinating    Tramadol Hcl Itching and Other (See Comments)    Can tolerate, however (in 2023)      Review of Systems negative except from HPI and PMH  Physical Exam BP 103/60   Pulse 60   Ht 6' (1.829 m)     SpO2 93%   BMI 26.85 kg/m  Well developed and well nourished in no acute distress HENT normal E scleral and icterus clear Neck Supple JVP flat; carotids brisk and full Clear to ausculation IRRRegular rate and rhythm, no murmurs gallops or rub Soft with active bowel sounds No clubbing cyanosis  Edema Alert and oriented, grossly normal motor and sensory function Skin Warm and Dry  ECG sinus with frequent PVCs right bundle indeterminate axis  CrCl cannot be calculated (Patient's most recent lab result is older than the maximum 21 days allowed.).   Assessment and  Plan  Cardiomyopathy  Coronary artery disease with positive FFR  PVCs-frequent  Parkinson's disease   Patient has PVCs that are symptomatic brought into attention today.  In the context of his known mild left  ventricular dysfunction (echo pending) is known abnormal FFR, discussed with Dr. Skains we will proceed with cardiac catheterization.  Risks and benefits have been reviewed.  For now we will increase his metoprolol from 25-50, we will resume low-dose aspirin 81 mg and begin him on atorvastatin 10 mg starting low just because he takes multiple medications I worry about tolerance.  Further therapies regarding the PVCs would depend on left ventricular dysfunction and the degree of symptoms.  In the context of his left ventricular dysfunction known coronary artery disease I would probably opt for dronaderone at first blush   Current medicines are reviewed at length with the patient today .  The patient does not  have concerns regarding medicines.  

## 2023-01-02 NOTE — Patient Instructions (Signed)
Medication Instructions:  Your physician has recommended you make the following change in your medication:   ** Stop Metoprolol 25mg    ** Start Metoprolol Succinate 50mg  - 1 tablet by mouth daily  ** Begin taking Lasix 20mg  - 2 tablets (40mg ) by mouth daily  *If you need a refill on your cardiac medications before your next appointment, please call your pharmacy*   Lab Work: BMET and Mg today If you have labs (blood work) drawn today and your tests are completely normal, you will receive your results only by: [place] (if you have MyChart) OR A paper copy in the mail If you have any lab test that is abnormal or we need to change your treatment, we will call you to review the results.   Testing/Procedures: [place]- Long Term Monitor Instructions  Your physician has requested you wear a ZIO patch monitor for 3 days.  This is a single patch monitor. [employer] supplies one patch monitor per enrollment. Additional stickers are not available. Please do not apply patch if you will be having a Nuclear Stress Test,  Echocardiogram, Cardiac CT, MRI, or Chest Xray during the period you would be wearing the  monitor. The patch cannot be worn during these tests. You cannot remove and re-apply the  ZIO XT patch monitor.  Your ZIO patch monitor will be mailed 3 day USPS to your address on file. It may take 3-5 days  to receive your monitor after you have been enrolled.  Once you have received your monitor, please review the enclosed instructions. Your monitor  has already been registered assigning a specific monitor serial # to you.  Billing and Patient Assistance Program Information  We have supplied [employer] with any of your insurance information on file for billing purposes. [employer] offers a sliding scale Patient Assistance Program for patients that do not have  insurance, or whose insurance does not completely cover the cost of the ZIO monitor.  You must apply for the Patient  Assistance Program to qualify for this discounted rate.  To apply, please call [employer] at [phone], select option 4, select option 2, ask to apply for  Patient Assistance Program. [name] will ask your household income, and how many people  are in your household. They will quote your out-of-pocket cost based on that information.  [employer] will also be able to set up a 32-month, interest-free payment plan if needed.  Applying the monitor   Shave hair from upper left chest.  Hold abrader disc by orange tab. Rub abrader in 40 strokes over the upper left chest as  indicated in your monitor instructions.  Clean area with 4 enclosed alcohol pads. Let dry.  Apply patch as indicated in monitor instructions. Patch will be placed under collarbone on left  side of chest with arrow pointing upward.  Rub patch adhesive wings for 2 minutes. Remove white label marked "1". Remove the white  label marked "2". Rub patch adhesive wings for 2 additional minutes.  While looking in a mirror, press and release button in center of patch. A small green light will  flash 3-4 times. This will be your only indicator that the monitor has been turned on.  Do not shower for the first 24 hours. You may shower after the first 24 hours.  Press the button if you feel a symptom. You will hear a small click. Record Date, Time and  Symptom in the Patient Logbook.  When you are ready to remove the patch, follow instructions  on the last 2 pages of Patient  Logbook. Stick patch monitor onto the last page of Patient Logbook.  Place Patient Logbook in the blue and white box. Use locking tab on box and tape box closed  securely. The blue and white box has prepaid postage on it. Please place it in the mailbox as  soon as possible. Your physician should have your test results approximately 7 days after the  monitor has been mailed back to [facility].  Call [place] at [phone] if you have questions  regarding  your ZIO XT patch monitor. Call them immediately if you see an orange light blinking on your  monitor.  If your monitor falls off in less than 4 days, contact our Monitor department at [phone].  If your monitor becomes loose or falls off after 4 days call [employer] at [phone] for  suggestions on securing your monitor    Follow-Up: At [facility], you and your health needs are our priority.  As part of our continuing mission to provide you with exceptional heart care, we have created designated Provider Care Teams.  These Care Teams include your primary Cardiologist (physician) and Advanced Practice Providers (APPs -  Physician Assistants and Nurse Practitioners) who all work together to provide you with the care you need, when you need it.  We recommend signing up for the patient portal called "MyChart".  Sign up information is provided on this After Visit Summary.  MyChart is used to connect with patients for Virtual Visits (Telemedicine).  Patients are able to view lab/test results, encounter notes, upcoming appointments, etc.  Non-urgent messages can be sent to your provider as well.   To learn more about what you can do with MyChart, go to [email or web address].    Your next appointment:   To be determined   [place] A DEPT OF [place], [place] [id number] [name] [address] Dept: [phone] Loc: [place]  01/02/2023  You are scheduled for a Cardiac Catheterization on Friday, March 29 with Dr. Lauree Chandler.  1. Please arrive at the [facility] (Main Entrance A) at [facility]: [address] [place], [place] [address] at 5:30 AM (This time is two hours before your procedure to ensure your preparation). Free valet parking service is available.   Special note: Every effort is made to have your procedure done on  time. Please understand that emergencies sometimes delay scheduled procedures.  2. Diet: Do not eat solid foods after midnight.  The patient may have clear liquids until 5am upon the day of the procedure.  3. Labs:Labs today  4. Medication instructions in preparation for your procedure:   Contrast Allergy: No    Take only 1/2 your regular dose of insulin the night before the procedure and no insulin the morning of your procedure  Do not take Diabetes Med Glucophage (Metformin) on the day of the procedure and HOLD 48 HOURS AFTER THE PROCEDURE.  On the morning of your procedure, take your Aspirin 81 mg and any morning medicines NOT listed above.  You may use sips of water.  5. Plan for one night stay--bring personal belongings. 6. Bring a current list of your medications and current insurance cards. 7. You MUST have a responsible person to drive you home. 8. Someone MUST be with you the first 24 hours after you arrive home or your discharge will be  delayed. 9. Please wear clothes that are easy to get on and off and wear slip-on shoes.  Thank you for allowing [place] to care for you!   -- [facility]

## 2023-01-07 DIAGNOSIS — I10 Essential (primary) hypertension: Secondary | ICD-10-CM | POA: Diagnosis not present

## 2023-01-07 DIAGNOSIS — K219 Gastro-esophageal reflux disease without esophagitis: Secondary | ICD-10-CM | POA: Diagnosis not present

## 2023-01-07 DIAGNOSIS — I5040 Unspecified combined systolic (congestive) and diastolic (congestive) heart failure: Secondary | ICD-10-CM | POA: Diagnosis not present

## 2023-01-07 DIAGNOSIS — E1165 Type 2 diabetes mellitus with hyperglycemia: Secondary | ICD-10-CM | POA: Diagnosis not present

## 2023-01-07 DIAGNOSIS — N1831 Chronic kidney disease, stage 3a: Secondary | ICD-10-CM | POA: Diagnosis not present

## 2023-01-07 DIAGNOSIS — E7800 Pure hypercholesterolemia, unspecified: Secondary | ICD-10-CM | POA: Diagnosis not present

## 2023-01-10 ENCOUNTER — Ambulatory Visit: Payer: Medicare Other | Attending: Internal Medicine

## 2023-01-10 ENCOUNTER — Telehealth: Payer: Self-pay | Admitting: Cardiology

## 2023-01-10 ENCOUNTER — Telehealth: Payer: Self-pay | Admitting: *Deleted

## 2023-01-10 DIAGNOSIS — R002 Palpitations: Secondary | ICD-10-CM

## 2023-01-10 NOTE — Telephone Encounter (Signed)
Wife calling in to say that they havent received the heart monitor. Please advise

## 2023-01-10 NOTE — Telephone Encounter (Signed)
Pt wife returning call. 

## 2023-01-10 NOTE — Progress Notes (Unsigned)
Enrolled patient for a 3 day Zio XT monitor to be mailed to patients home  

## 2023-01-10 NOTE — Addendum Note (Signed)
Addended by: Thora Lance on: 01/10/2023 08:42 AM   Modules accepted: Orders

## 2023-01-10 NOTE — Telephone Encounter (Addendum)
Cardiac Catheterization scheduled at [facility] for: Friday January 11, 2023 7:30 AM Arrival time [facility] A at: 5:30 AM  Nothing to eat after midnight prior to procedure, clear liquids until 5 AM day of procedure.  Medication instructions: -Hold:  Metformin-day of procedure and 48 hours post procedure  Jardiance- AM of procedure  Insulin-1/2 usual dose Lantus HS prior to procedure-wife reports does not take Insulin in the mornings.  Lasix/KCl-AM of procedure  -Other usual morning medications can be taken with sips of water including aspirin 81 mg.  Confirmed patient has responsible adult to drive home post procedure and be with patient first 24 hours after arriving home.  Plan to go home the same day, you will only stay overnight if medically necessary.  Reviewed procedure instructions with patient's wife (DPR).  Left message for patient to call back to review procedure instructions.

## 2023-01-10 NOTE — Telephone Encounter (Signed)
Phone call to pt's wife and advise order placed for ZIO XT 3 day monitor.  Pt will be contacted by monitor tech re: instructions.  Pt's wife verbalizes understanding and agrees with current plan.

## 2023-01-10 NOTE — Telephone Encounter (Signed)
Spoke with patient's wife (DPR), questions answered.

## 2023-01-11 ENCOUNTER — Encounter (HOSPITAL_COMMUNITY)
Admission: RE | Disposition: A | Discharge status: Home / Self Care | Payer: Self-pay | Source: Home / Self Care | Attending: Cardiovascular Disease

## 2023-01-11 ENCOUNTER — Other Ambulatory Visit: Payer: Self-pay

## 2023-01-11 ENCOUNTER — Other Ambulatory Visit (HOSPITAL_COMMUNITY): Payer: Self-pay

## 2023-01-11 ENCOUNTER — Ambulatory Visit (HOSPITAL_COMMUNITY)
Admission: RE | Admit: 2023-01-11 | Discharge: 2023-01-11 | Disposition: A | Discharge status: Home / Self Care | Payer: Medicare Other | Attending: Cardiovascular Disease | Admitting: Cardiovascular Disease

## 2023-01-11 DIAGNOSIS — I429 Cardiomyopathy, unspecified: Secondary | ICD-10-CM | POA: Diagnosis not present

## 2023-01-11 DIAGNOSIS — Z955 Presence of coronary angioplasty implant and graft: Secondary | ICD-10-CM | POA: Insufficient documentation

## 2023-01-11 DIAGNOSIS — I5022 Chronic systolic (congestive) heart failure: Secondary | ICD-10-CM | POA: Diagnosis not present

## 2023-01-11 DIAGNOSIS — I2510 Atherosclerotic heart disease of native coronary artery without angina pectoris: Secondary | ICD-10-CM | POA: Diagnosis not present

## 2023-01-11 DIAGNOSIS — E119 Type 2 diabetes mellitus without complications: Secondary | ICD-10-CM | POA: Diagnosis not present

## 2023-01-11 DIAGNOSIS — Z79899 Other long term (current) drug therapy: Secondary | ICD-10-CM | POA: Insufficient documentation

## 2023-01-11 DIAGNOSIS — I110 Hypertensive heart disease with heart failure: Secondary | ICD-10-CM | POA: Diagnosis not present

## 2023-01-11 DIAGNOSIS — G20A1 Parkinson's disease without dyskinesia, without mention of fluctuations: Secondary | ICD-10-CM | POA: Diagnosis not present

## 2023-01-11 DIAGNOSIS — I493 Ventricular premature depolarization: Secondary | ICD-10-CM | POA: Diagnosis not present

## 2023-01-11 HISTORY — PX: LEFT HEART CATH AND CORONARY ANGIOGRAPHY: CATH118249

## 2023-01-11 HISTORY — PX: CORONARY STENT INTERVENTION: CATH118234

## 2023-01-11 LAB — POCT ACTIVATED CLOTTING TIME
Activated Clotting Time: 277 seconds
Activated Clotting Time: 293 seconds

## 2023-01-11 LAB — GLUCOSE, CAPILLARY: Glucose-Capillary: 116 mg/dL — ABNORMAL HIGH (ref 70–99)

## 2023-01-11 SURGERY — LEFT HEART CATH AND CORONARY ANGIOGRAPHY
Anesthesia: LOCAL

## 2023-01-11 MED ORDER — TAMSULOSIN HCL 0.4 MG PO CAPS: 0.4000 mg | ORAL_CAPSULE | Freq: Every day | ORAL | Status: DC

## 2023-01-11 MED ORDER — HEPARIN SODIUM (PORCINE) 1000 UNIT/ML IJ SOLN
INTRAMUSCULAR | Status: DC | PRN
  Administered 2023-01-11: 5000 [IU] via INTRAVENOUS
  Administered 2023-01-11: 3000 [IU] via INTRAVENOUS
  Administered 2023-01-11: 2000 [IU] via INTRAVENOUS
  Administered 2023-01-11: 5000 [IU] via INTRAVENOUS

## 2023-01-11 MED ORDER — CLOPIDOGREL BISULFATE 75 MG PO TABS: 75.0000 mg | ORAL_TABLET | Freq: Every day | ORAL | Status: DC

## 2023-01-11 MED ORDER — SODIUM CHLORIDE 0.9 % WEIGHT BASED INFUSION: 1.0000 mL/kg/h | INTRAVENOUS | Status: DC

## 2023-01-11 MED ORDER — NITROGLYCERIN 0.4 MG SL SUBL
0.4000 mg | SUBLINGUAL_TABLET | SUBLINGUAL | 2 refills | Status: AC | PRN
  Filled 2023-01-11: qty 25, 5d supply, fill #0

## 2023-01-11 MED ORDER — ROSUVASTATIN CALCIUM 10 MG PO TABS: 10.0000 mg | ORAL_TABLET | Freq: Every day | ORAL | Status: DC

## 2023-01-11 MED ORDER — HYDRALAZINE HCL 20 MG/ML IJ SOLN: 10.0000 mg | INTRAMUSCULAR | Status: DC | PRN

## 2023-01-11 MED ORDER — MIDAZOLAM HCL 2 MG/2ML IJ SOLN
INTRAMUSCULAR | Status: DC | PRN
  Administered 2023-01-11: 1 mg via INTRAVENOUS

## 2023-01-11 MED ORDER — CLOPIDOGREL BISULFATE 300 MG PO TABS
ORAL_TABLET | ORAL | Status: AC
  Filled 2023-01-11: qty 1

## 2023-01-11 MED ORDER — POTASSIUM CHLORIDE ER 10 MEQ PO TBCR: 10.0000 meq | EXTENDED_RELEASE_TABLET | Freq: Two times a day (BID) | ORAL | Status: DC

## 2023-01-11 MED ORDER — SODIUM CHLORIDE 0.9% FLUSH: 3.0000 mL | Freq: Two times a day (BID) | INTRAVENOUS | Status: DC

## 2023-01-11 MED ORDER — SODIUM CHLORIDE 0.9 % IV SOLN: INTRAVENOUS | Status: DC

## 2023-01-11 MED ORDER — ACETAMINOPHEN 325 MG PO TABS: 650.0000 mg | ORAL_TABLET | ORAL | Status: DC | PRN

## 2023-01-11 MED ORDER — VERAPAMIL HCL 2.5 MG/ML IV SOLN
INTRAVENOUS | Status: DC | PRN
  Administered 2023-01-11: 10 mL via INTRA_ARTERIAL

## 2023-01-11 MED ORDER — METOPROLOL SUCCINATE ER 25 MG PO TB24: 50.0000 mg | ORAL_TABLET | Freq: Every day | ORAL | Status: DC

## 2023-01-11 MED ORDER — ONDANSETRON HCL 4 MG/2ML IJ SOLN: 4.0000 mg | Freq: Four times a day (QID) | INTRAMUSCULAR | Status: DC | PRN

## 2023-01-11 MED ORDER — ROSUVASTATIN CALCIUM 40 MG PO TABS
40.0000 mg | ORAL_TABLET | Freq: Every day | ORAL | 0 refills | Status: DC
  Filled 2023-01-11: qty 90, 90d supply, fill #0

## 2023-01-11 MED ORDER — SODIUM CHLORIDE 0.9 % IV SOLN: 250.0000 mL | INTRAVENOUS | Status: DC | PRN

## 2023-01-11 MED ORDER — ASPIRIN 81 MG PO TBEC: 81.0000 mg | DELAYED_RELEASE_TABLET | Freq: Every day | ORAL | Status: DC

## 2023-01-11 MED ORDER — FAMOTIDINE IN NACL 20-0.9 MG/50ML-% IV SOLN
INTRAVENOUS | Status: AC
  Filled 2023-01-11: qty 50

## 2023-01-11 MED ORDER — FENTANYL CITRATE (PF) 100 MCG/2ML IJ SOLN
INTRAMUSCULAR | Status: DC | PRN
  Administered 2023-01-11: 25 ug via INTRAVENOUS

## 2023-01-11 MED ORDER — SODIUM CHLORIDE 0.9% FLUSH: 3.0000 mL | INTRAVENOUS | Status: DC | PRN

## 2023-01-11 MED ORDER — MIDAZOLAM HCL 2 MG/2ML IJ SOLN
INTRAMUSCULAR | Status: AC
  Filled 2023-01-11: qty 2

## 2023-01-11 MED ORDER — FAMOTIDINE IN NACL 20-0.9 MG/50ML-% IV SOLN
INTRAVENOUS | Status: DC | PRN
  Administered 2023-01-11: 20 mg via INTRAVENOUS

## 2023-01-11 MED ORDER — SODIUM CHLORIDE 0.9 % WEIGHT BASED INFUSION
3.0000 mL/kg/h | INTRAVENOUS | Status: AC
  Administered 2023-01-11: 3 mL/kg/h via INTRAVENOUS

## 2023-01-11 MED ORDER — HEPARIN SODIUM (PORCINE) 1000 UNIT/ML IJ SOLN
INTRAMUSCULAR | Status: AC
  Filled 2023-01-11: qty 10

## 2023-01-11 MED ORDER — LIDOCAINE HCL (PF) 1 % IJ SOLN
INTRAMUSCULAR | Status: AC
  Filled 2023-01-11: qty 30

## 2023-01-11 MED ORDER — INSULIN GLARGINE-YFGN 100 UNIT/ML ~~LOC~~ SOLN: 20.0000 [IU] | Freq: Every day | SUBCUTANEOUS | Status: DC

## 2023-01-11 MED ORDER — CLOPIDOGREL BISULFATE 300 MG PO TABS
ORAL_TABLET | ORAL | Status: DC | PRN
  Administered 2023-01-11: 600 mg via ORAL

## 2023-01-11 MED ORDER — ASPIRIN 81 MG PO CHEW: 81.0000 mg | CHEWABLE_TABLET | ORAL | Status: AC

## 2023-01-11 MED ORDER — CARBIDOPA-LEVODOPA 25-100 MG PO TABS: 3.0000 | ORAL_TABLET | Freq: Four times a day (QID) | ORAL | Status: DC

## 2023-01-11 MED ORDER — FENTANYL CITRATE (PF) 100 MCG/2ML IJ SOLN
INTRAMUSCULAR | Status: AC
  Filled 2023-01-11: qty 2

## 2023-01-11 MED ORDER — HEPARIN (PORCINE) IN NACL 1000-0.9 UT/500ML-% IV SOLN
INTRAVENOUS | Status: DC | PRN
  Administered 2023-01-11 (×2): 500 mL

## 2023-01-11 MED ORDER — CLOPIDOGREL BISULFATE 75 MG PO TABS
75.0000 mg | ORAL_TABLET | Freq: Every day | ORAL | 2 refills | Status: DC
  Filled 2023-01-11: qty 90, 90d supply, fill #0

## 2023-01-11 MED ORDER — ROPINIROLE HCL 1 MG PO TABS: 1.0000 mg | ORAL_TABLET | Freq: Three times a day (TID) | ORAL | Status: DC

## 2023-01-11 MED ORDER — PANTOPRAZOLE SODIUM 40 MG PO TBEC
40.0000 mg | DELAYED_RELEASE_TABLET | Freq: Every day | ORAL | 1 refills | Status: DC
  Filled 2023-01-11: qty 30, 30d supply, fill #0

## 2023-01-11 MED ORDER — IOHEXOL 350 MG/ML SOLN
INTRAVENOUS | Status: DC | PRN
  Administered 2023-01-11: 200 mL

## 2023-01-11 MED ORDER — LABETALOL HCL 5 MG/ML IV SOLN: 10.0000 mg | INTRAVENOUS | Status: DC | PRN

## 2023-01-11 MED ORDER — EMPAGLIFLOZIN 25 MG PO TABS: 25.0000 mg | ORAL_TABLET | Freq: Every day | ORAL | Status: DC

## 2023-01-11 MED ORDER — VERAPAMIL HCL 2.5 MG/ML IV SOLN
INTRAVENOUS | Status: AC
  Filled 2023-01-11: qty 2

## 2023-01-11 MED ORDER — LIDOCAINE HCL (PF) 1 % IJ SOLN
INTRAMUSCULAR | Status: DC | PRN
  Administered 2023-01-11: 2 mL

## 2023-01-11 MED ORDER — ENTACAPONE 200 MG PO TABS: 200.0000 mg | ORAL_TABLET | Freq: Three times a day (TID) | ORAL | Status: DC

## 2023-01-11 MED ORDER — SACUBITRIL-VALSARTAN 24-26 MG PO TABS: 1.0000 | ORAL_TABLET | Freq: Two times a day (BID) | ORAL | Status: DC

## 2023-01-11 SURGICAL SUPPLY — 30 items
BALL SAPPHIRE NC24 2.75X12 (BALLOONS) ×1
BALL SAPPHIRE NC24 3.0X12 (BALLOONS) ×1
BALLN SAPPHIRE 2.5X10 (BALLOONS) ×1
BALLN ~~LOC~~ EMERGE MR 2.5X8 (BALLOONS) ×1
BALLOON SAPPHIRE 2.5X10 (BALLOONS) IMPLANT
BALLOON SAPPHIRE NC24 2.75X12 (BALLOONS) IMPLANT
BALLOON SAPPHIRE NC24 3.0X12 (BALLOONS) IMPLANT
BALLOON ~~LOC~~ EMERGE MR 2.5X8 (BALLOONS) IMPLANT
CATH 5FR JL3.5 JR4 ANG PIG MP (CATHETERS) IMPLANT
CATH VISTA GUIDE 6FR JR4 (CATHETERS) IMPLANT
CATH VISTA GUIDE 6FR XBLAD3.5 (CATHETERS) IMPLANT
DEVICE RAD COMP TR BAND LRG (VASCULAR PRODUCTS) IMPLANT
GLIDESHEATH SLEND SS 6F .021 (SHEATH) IMPLANT
GUIDEWIRE INQWIRE 1.5J.035X260 (WIRE) IMPLANT
INQWIRE 1.5J .035X260CM (WIRE) ×1
KIT ENCORE 26 ADVANTAGE (KITS) IMPLANT
KIT HEART LEFT (KITS) ×2 IMPLANT
PACK CARDIAC CATHETERIZATION (CUSTOM PROCEDURE TRAY) ×1 IMPLANT
SHEATH PROBE COVER 6X72 (BAG) IMPLANT
STENT SYNERGY XD 2.25X12 (Permanent Stent) IMPLANT
STENT SYNERGY XD 2.50X20 (Permanent Stent) IMPLANT
STENT SYNERGY XD 2.75X16 (Permanent Stent) IMPLANT
STENT SYNERGY XD 2.75X32 (Permanent Stent) IMPLANT
SYNERGY XD 2.25X12 (Permanent Stent) ×1 IMPLANT
SYNERGY XD 2.50X20 (Permanent Stent) ×1 IMPLANT
SYNERGY XD 2.75X16 (Permanent Stent) ×1 IMPLANT
SYNERGY XD 2.75X32 (Permanent Stent) ×1 IMPLANT
TRANSDUCER W/STOPCOCK (MISCELLANEOUS) ×2 IMPLANT
TUBING CIL FLEX 10 FLL-RA (TUBING) ×1 IMPLANT
WIRE COUGAR XT STRL 190CM (WIRE) IMPLANT

## 2023-01-11 NOTE — Interval H&P Note (Signed)
History and Physical Interval Note:  01/11/2023 7:17 AM  [name]  has presented today for surgery, with the diagnosis of palpitations, history of CAD.  The various methods of treatment have been discussed with the patient and family. After consideration of risks, benefits and other options for treatment, the patient has consented to  Procedure(s): LEFT HEART CATH AND CORONARY ANGIOGRAPHY (N/A) as a surgical intervention.  The patient's history has been reviewed, patient examined, no change in status, stable for surgery.  I have reviewed the patient's chart and labs.  Questions were answered to the patient's satisfaction.    Cath Lab Visit (complete for each Cath Lab visit)  Clinical Evaluation Leading to the Procedure:   ACS: No.  Non-ACS:    Anginal Classification: No Symptoms  Anti-ischemic medical therapy: Minimal Therapy (1 class of medications)  Non-Invasive Test Results: High-risk stress test findings: cardiac mortality >3%/year (Coronary CTA with severe three vessel CAD)  Prior CABG: No Previous CABG        Lauree Chandler

## 2023-01-11 NOTE — Discharge Instructions (Signed)

## 2023-01-11 NOTE — Progress Notes (Addendum)
Discussed with pt and his daughter stent restrictions, importance of taking antiplatelet medication, exercise guidelines, heart healthy diet, nitroglycerin use and CRPII. Pt receptive to information.  [place], RRT

## 2023-01-11 NOTE — Discharge Summary (Signed)
Discharge Summary for Same Day PCI   Patient ID: [name] MRN: [id number]; DOB: 12-05-1945  Admit date: 01/11/2023 Discharge date: 01/11/2023  Primary Care Provider: Kathalene Frames, MD  Primary Cardiologist: Candee Furbish, MD  Primary Electrophysiologist:  None   Discharge Diagnoses    Active Problems:   Coronary artery disease involving native coronary 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 angina pectoris   Diagnostic Studies/Procedures    Cardiac Catheterization 01/11/2023:    Mid RCA lesion is 99% stenosed.   Prox RCA to Mid RCA lesion is 60% stenosed.   Mid Cx lesion is 99% stenosed.   Mid LAD lesion is 95% stenosed.   A drug-eluting stent was successfully placed using a SYNERGY XD 2.75X16.   A drug-eluting stent was successfully placed using a SYNERGY XD 2.75X32.   A drug-eluting stent was successfully placed using a SYNERGY XD 2.25X12.   A drug-eluting stent was successfully placed using a SYNERGY XD 2.50X20.   Post intervention, there is a 0% residual stenosis.   Post intervention, there is a 0% residual stenosis.   Post intervention, there is a 0% residual stenosis.   Post intervention, there is a 0% residual stenosis.   Severe mid LAD stenosis. Successful PTCA/DES x 1 mid LAD Severe mid Circumflex stenosis. Successful PTCA/DES x 1 mid Circumflex Severe mid RCA stenosis. Successful PTCA/DES x 2 mid RCA Normal LVEDP   Recommendations: Will discharge home today after 6 hours with same day post PCI discharge planning. ASA/Plavix for at least six months. Continue statin. Hold metformin 48 hours post cath. Stop Prilosec since we started Plavix. (He rarely takes Prilosec per family).   Diagnostic Dominance: Right  Intervention     _____________   History of Present Illness     [name] is a 77 y.o. male with parkinson's disease, DM, HTN, recurrent ESBL UTI, HFrEF who was recently seen in the office with Dr. Caryl Comes as an add on in the setting of significant  ectopy on his echocardiogram. He was found to have a reduced EF back 04/2022 and underwent outpatient CTa with multivessel disease with FFR. He was placed on guideline therapy. Planned for repeat outpatient echo to determine if any improvement in EF. Seen by Dr. Caryl Comes 3/20 and reported mild lightheadedness. Given his known LV dysfunction, and abnormal FFR decision was made to arrange for outpatient cardiac cath.   Hospital Course     The patient underwent cardiac cath as noted above with severe multivessel stenosis with PCI/DES x1 to mLAD, DES x1 to mLCx and DESx2 to [facility]. Plan for DAPT with ASA/plavix for at least 6 months. The patient was seen by cardiac rehab while in short stay. There were no observed complications post cath. Radial cath site was re-evaluated prior to discharge and found to be stable without any complications. Instructions/precautions regarding cath site care were given prior to discharge.  [name] was seen by Dr. Angelena Form and determined stable for discharge home. Follow up with our office has been arranged. Medications are listed below. Pertinent changes include addition of ASA, plavix, protonix. Given his hx of UTIs, his jardiance was stopped for now.  _____________  Cath/PCI Registry Performance & Quality Measures: Aspirin prescribed? - Yes ADP Receptor Inhibitor (Plavix/Clopidogrel, Brilinta/Ticagrelor or Effient/Prasugrel) prescribed (includes medically managed patients)? - Yes High Intensity Statin (Lipitor 40-80mg  or Crestor 20-40mg ) prescribed? - Yes For EF <40%, was ACEI/ARB prescribed? - Yes For EF <40%, Aldosterone Antagonist (Spironolactone or Eplerenone) prescribed? - Not Applicable (  EF >/= 40%) Cardiac Rehab Phase II ordered (Included Medically managed Patients)? - Yes _____________   Discharge Vitals Blood pressure (!) 112/59, pulse (!) 58, temperature (!) 97.1 F (36.2 C), temperature source Temporal, resp. rate 18, height 6' (1.829 m), weight 84.8 kg,  SpO2 95 %.  Filed Weights   01/11/23 0500 01/11/23 0615  Weight: 89.8 kg 84.8 kg    Last Labs & Radiologic Studies    CBC No results for input(s): "WBC", "NEUTROABS", "HGB", "HCT", "MCV", "PLT" in the last 72 hours. Basic Metabolic Panel No results for input(s): "NA", "K", "CL", "CO2", "GLUCOSE", "BUN", "CREATININE", "CALCIUM", "MG", "PHOS" in the last 72 hours. Liver Function Tests No results for input(s): "AST", "ALT", "ALKPHOS", "BILITOT", "PROT", "ALBUMIN" in the last 72 hours. No results for input(s): "LIPASE", "AMYLASE" in the last 72 hours. High Sensitivity Troponin:   No results for input(s): "TROPONINIHS" in the last 720 hours.  BNP Invalid input(s): "POCBNP" D-Dimer No results for input(s): "DDIMER" in the last 72 hours. Hemoglobin A1C No results for input(s): "HGBA1C" in the last 72 hours. Fasting Lipid Panel No results for input(s): "CHOL", "HDL", "LDLCALC", "TRIG", "CHOLHDL", "LDLDIRECT" in the last 72 hours. Thyroid Function Tests No results for input(s): "TSH", "T4TOTAL", "T3FREE", "THYROIDAB" in the last 72 hours.  Invalid input(s): "FREET3" _____________  CARDIAC CATHETERIZATION  Result Date: 01/11/2023   Mid RCA lesion is 99% stenosed.   Prox RCA to Mid RCA lesion is 60% stenosed.   Mid Cx lesion is 99% stenosed.   Mid LAD lesion is 95% stenosed.   A drug-eluting stent was successfully placed using a SYNERGY XD 2.75X16.   A drug-eluting stent was successfully placed using a SYNERGY XD 2.75X32.   A drug-eluting stent was successfully placed using a SYNERGY XD 2.25X12.   A drug-eluting stent was successfully placed using a SYNERGY XD 2.50X20.   Post intervention, there is a 0% residual stenosis.   Post intervention, there is a 0% residual stenosis.   Post intervention, there is a 0% residual stenosis.   Post intervention, there is a 0% residual stenosis. Severe mid LAD stenosis. Successful PTCA/DES x 1 mid LAD Severe mid Circumflex stenosis. Successful PTCA/DES x 1 mid  Circumflex Severe mid RCA stenosis. Successful PTCA/DES x 2 mid RCA Normal LVEDP Recommendations: Will discharge home today after 6 hours with same day post PCI discharge planning. ASA/Plavix for at least six months. Continue statin. Hold metformin 48 hours post cath. Stop Prilosec since we started Plavix. (He rarely takes Prilosec per family).   ECHOCARDIOGRAM COMPLETE  Result Date: 01/02/2023    ECHOCARDIOGRAM REPORT   Patient Name:   [name]   Date of Exam: 01/02/2023 Medical Rec #:  [id number]     Height:       72.0 in Accession #:    [id number]    Weight:       198.0 lb Date of Birth:  1946-02-19     BSA:          2.121 m Patient Age:    60 years      BP:           150/86 mmHg Patient Gender: M             HR:           70 bpm. Exam Location:  [address] Procedure: 2D Echo, 3D Echo, Cardiac Doppler and Color Doppler Indications:    I25.10 CAD  History:  Patient has prior history of Echocardiogram examinations, most                 recent 05/04/2022. Arrythmias:Left anterior fascicular block;                 Risk Factors:HLD, Hypertension and Diabetes.  Sonographer:    Marygrace Drought RCS Referring Phys: [id number] [place]  1. Left ventricular ejection fraction, by estimation, is 45 to 50%. Left ventricular ejection fraction by 3D volume is 45 %. The left ventricle has mildly decreased function. The left ventricle demonstrates global hypokinesis. There is mild concentric left ventricular hypertrophy. Left ventricular diastolic parameters are consistent with Grade II diastolic dysfunction (pseudonormalization).  2. Right ventricular systolic function is normal. The right ventricular size is normal. There is moderately elevated pulmonary artery systolic pressure. The estimated right ventricular systolic pressure is 123XX123 mmHg.  3. Left atrial size was severely dilated.  4. Right atrial size was mildly dilated.  5. The mitral valve is normal in structure. Trivial mitral valve regurgitation.   6. Tricuspid valve regurgitation is mild to moderate.  7. The aortic valve is tricuspid. There is mild calcification of the aortic valve. There is mild thickening of the aortic valve. Aortic valve regurgitation is not visualized. Aortic valve sclerosis/calcification is present, without any evidence of aortic stenosis.  8. The inferior vena cava is dilated in size with >50% respiratory variability, suggesting right atrial pressure of 8 mmHg. Comparison(s): Prior images reviewed side by side. Changes from prior study are noted. The left ventricular function is worsened. PVCs were seen during the previous study as well, but were less frequent. Conclusion(s)/Recommendation(s): There are very frequent PVCs, often in a pattern of bigeminy. The PVCs are very early and therefore ineffective (there is virtually no flow through the aortic valve with the PVCs). FINDINGS  Left Ventricle: Left ventricular ejection fraction, by estimation, is 45 to 50%. Left ventricular ejection fraction by 3D volume is 45 %. The left ventricle has mildly decreased function. The left ventricle demonstrates global hypokinesis. The left ventricular internal cavity size was normal in size. There is mild concentric left ventricular hypertrophy. Left ventricular diastolic parameters are consistent with Grade II diastolic dysfunction (pseudonormalization). Indeterminate filling pressures. Right Ventricle: The right ventricular size is normal. No increase in right ventricular wall thickness. Right ventricular systolic function is normal. There is moderately elevated pulmonary artery systolic pressure. The tricuspid regurgitant velocity is 3.08 m/s, and with an assumed right atrial pressure of 8 mmHg, the estimated right ventricular systolic pressure is 123XX123 mmHg. Left Atrium: Left atrial size was severely dilated. Right Atrium: Right atrial size was mildly dilated. Pericardium: There is no evidence of pericardial effusion. Mitral Valve: The mitral valve  is normal in structure. Mild mitral annular calcification. Trivial mitral valve regurgitation. Tricuspid Valve: The tricuspid valve is normal in structure. Tricuspid valve regurgitation is mild to moderate. Aortic Valve: The aortic valve is tricuspid. There is mild calcification of the aortic valve. There is mild thickening of the aortic valve. Aortic valve regurgitation is not visualized. Aortic valve sclerosis/calcification is present, without any evidence of aortic stenosis. Pulmonic Valve: The pulmonic valve was grossly normal. Pulmonic valve regurgitation is not visualized. Aorta: The aortic root and ascending aorta are structurally normal, with no evidence of dilitation. Venous: The inferior vena cava is dilated in size with greater than 50% respiratory variability, suggesting right atrial pressure of 8 mmHg. IAS/Shunts: No atrial level shunt detected by color flow Doppler.  LEFT  VENTRICLE PLAX 2D LVIDd:         4.80 cm         Diastology LVIDs:         3.70 cm         LV e' medial:    5.87 cm/s LV PW:         1.40 cm         LV E/e' medial:  13.5 LV IVS:        1.37 cm         LV e' lateral:   8.81 cm/s LVOT diam:     2.00 cm         LV E/e' lateral: 9.0 LV SV:         90 LV SV Index:   42 LVOT Area:     3.14 cm        3D Volume EF                                LV 3D EF:    Left                                             ventricul                                             ar                                             ejection                                             fraction                                             by 3D                                             volume is                                             45 %.                                 3D Volume EF:                                3D EF:        45 %  LV EDV:       148 ml                                LV ESV:       81 ml                                LV SV:        67 ml RIGHT VENTRICLE RV Basal  diam:  3.70 cm RV S prime:     12.60 cm/s TAPSE (M-mode): 2.0 cm RVSP:           40.9 mmHg LEFT ATRIUM             Index        RIGHT ATRIUM           Index LA diam:        5.20 cm 2.45 cm/m   RA Pressure: 3.00 mmHg LA Vol (A2C):   79.7 ml 37.57 ml/m  RA Area:     17.10 cm LA Vol (A4C):   58.6 ml 27.62 ml/m  RA Volume:   43.60 ml  20.55 ml/m LA Biplane Vol: 69.9 ml 32.95 ml/m  AORTIC VALVE LVOT Vmax:   112.00 cm/s LVOT Vmean:  79.000 cm/s LVOT VTI:    0.286 m  AORTA Ao Root diam: 3.70 cm Ao Asc diam:  3.70 cm MITRAL VALVE               TRICUSPID VALVE MV Area (PHT):             TR Peak grad:   37.9 mmHg MV Decel Time:             TR Vmax:        308.00 cm/s MV E velocity: 79.10 cm/s  Estimated RAP:  3.00 mmHg MV A velocity: 69.20 cm/s  RVSP:           40.9 mmHg MV E/A ratio:  1.14                            SHUNTS                            Systemic VTI:  0.29 m                            Systemic Diam: 2.00 cm Dani Gobble Croitoru MD Electronically signed by Sanda Klein MD Signature Date/Time: 01/02/2023/5:56:59 PM    Final     Disposition   Pt is being discharged home today in good condition.  Follow-up Plans & Appointments     Follow-up Information     Elgie Collard, PA-C Follow up on 01/23/2023.   Specialty: Cardiology Why: at 8:25am for your follow up appt with Dr. Alyse Low' Bowles information: [address] [name] [address] [phone]                   Discharge Medications   Allergies as of 01/11/2023       Reactions   Nucynta [tapentadol] Other (See Comments)   Made the patient not feel well. "He did not feel well at all."   Oxycodone Itching   Sudafed [pseudoephedrine  Hcl] Other (See Comments)   Problems urinating    Tramadol Hcl Itching, Other (See Comments)   Can tolerate, however (in 2023)        Medication List     STOP taking these medications    empagliflozin 25 MG Tabs tablet Commonly known as: Jardiance   omeprazole  20 MG capsule Commonly known as: PRILOSEC       TAKE these medications    acetaminophen 500 MG tablet Commonly known as: TYLENOL Take 500-1,000 mg by mouth every 6 (six) hours as needed (for pain).   aspirin EC 81 MG tablet Take 81 mg by mouth daily. Swallow whole.   carbidopa-levodopa 25-100 MG tablet Commonly known as: SINEMET IR TAKE 3 TABLETS BY MOUTH AT 7:00 AM, 3 TABS AT 10:00 AM, 3 TABS AT 12:00 PM, 3 TABS AT 2:00 PM, 3 TABS AT 4:00, AND 3 TABS AT 6:00 PM   clopidogrel 75 MG tablet Commonly known as: PLAVIX Take 1 tablet (75 mg total) by mouth daily with breakfast. Start taking on: January 12, 2023   cyanocobalamin 1000 MCG tablet Commonly known as: VITAMIN B12 Take 2,000 mcg by mouth daily.   entacapone 200 MG tablet Commonly known as: COMTAN TAKE 1 TABLET BY MOUTH 3 TIMES DAILY (WITH FIRST THREE DOSAGES OF CARBIDOPA/LEVODOPA).   furosemide 20 MG tablet Commonly known as: LASIX Take 2 tablets (40 mg total) by mouth daily. What changed:  how much to take when to take this   Global Ease Inject Pen Needles 31G X 5 MM Misc Generic drug: Insulin Pen Needle Inject into the skin.   Inbrija 42 MG Caps Generic drug: Levodopa Samples of this drug were given to the patient, quantity 1, Lot Number [id number] exp 12-24 What changed:  when to take this reasons to take this   Lantus SoloStar 100 UNIT/ML Solostar Pen Generic drug: insulin glargine Inject 23 Units into the skin at bedtime.   metFORMIN 1000 MG tablet Commonly known as: GLUCOPHAGE Take 1,000 mg by mouth 2 (two) times daily with a meal.   methenamine 1 g tablet Commonly known as: MANDELAMINE Take 1 tablet (1,000 mg total) by mouth 2 (two) times daily. Take with any vitamin c, twice a day for uti prevention What changed:  when to take this additional instructions   metoprolol succinate 50 MG 24 hr tablet Commonly known as: TOPROL-XL Take 1 tablet (50 mg total) by mouth daily. Take with or  immediately following a meal.   nitroGLYCERIN 0.4 MG SL tablet Commonly known as: Nitrostat Place 1 tablet (0.4 mg total) under the tongue every 5 (five) minutes as needed.   pantoprazole 40 MG tablet Commonly known as: Protonix Take 1 tablet (40 mg total) by mouth daily.   potassium chloride 10 MEQ tablet Commonly known as: KLOR-CON Take 10 mEq by mouth 2 (two) times daily.   rOPINIRole 3 MG tablet Commonly known as: REQUIP TAKE 1 TABLET IN THE MORNING, TAKE 1 IN THE AFTERNOON, AND 1/2 TABLET IN THE EVENING   rosuvastatin 40 MG tablet Commonly known as: Crestor Take 1 tablet (40 mg total) by mouth daily. What changed:  medication strength how much to take   sacubitril-valsartan 24-26 MG Commonly known as: ENTRESTO Take 1 tablet by mouth 2 (two) times daily.   tamsulosin 0.4 MG Caps capsule Commonly known as: FLOMAX Take 0.4 mg by mouth in the morning and at bedtime.   triamcinolone 55 MCG/ACT Aero nasal inhaler Commonly known as: NASACORT Place 2 sprays  into the nose daily as needed (for allergies or rhinitis).   Tums 500 MG chewable tablet Generic drug: calcium carbonate Chew 2 tablets by mouth daily as needed for indigestion or heartburn.   vitamin C 100 MG tablet Take 100 mg by mouth in the morning and at bedtime.   Vitamin D 50 MCG (2000 UT) tablet Take 2,000 Units by mouth daily.        Allergies Allergies  Allergen Reactions   Nucynta [Tapentadol] Other (See Comments)    Made the patient not feel well. "He did not feel well at all."   Oxycodone Itching   Sudafed [Pseudoephedrine Hcl] Other (See Comments)    Problems urinating    Tramadol Hcl Itching and Other (See Comments)    Can tolerate, however (in 2023)    Outstanding Labs/Studies   FLP/LFTs in 8 weeks   Duration of Discharge Encounter   Greater than 30 minutes including physician time.  Signed, Reino Bellis, NP 01/11/2023, 1:21 PM

## 2023-01-11 NOTE — Progress Notes (Signed)
Patient and daughter was given discharge instructions. Both verbalized understanding. 

## 2023-01-14 ENCOUNTER — Encounter (HOSPITAL_COMMUNITY): Payer: Self-pay | Admitting: Cardiovascular Disease

## 2023-01-22 DIAGNOSIS — E7800 Pure hypercholesterolemia, unspecified: Secondary | ICD-10-CM | POA: Diagnosis not present

## 2023-01-22 DIAGNOSIS — I5040 Unspecified combined systolic (congestive) and diastolic (congestive) heart failure: Secondary | ICD-10-CM | POA: Diagnosis not present

## 2023-01-22 DIAGNOSIS — I10 Essential (primary) hypertension: Secondary | ICD-10-CM | POA: Diagnosis not present

## 2023-01-22 DIAGNOSIS — E1165 Type 2 diabetes mellitus with hyperglycemia: Secondary | ICD-10-CM | POA: Diagnosis not present

## 2023-01-22 DIAGNOSIS — N1831 Chronic kidney disease, stage 3a: Secondary | ICD-10-CM | POA: Diagnosis not present

## 2023-01-22 DIAGNOSIS — K219 Gastro-esophageal reflux disease without esophagitis: Secondary | ICD-10-CM | POA: Diagnosis not present

## 2023-01-22 NOTE — Progress Notes (Unsigned)
Office Visit    Patient [name] Date of Encounter: 01/22/2023  PCP:  Emilio Aspen, MD   [facility]  Cardiologist:  Donato Schultz, MD  Advanced Practice Provider:  No care team member to display Electrophysiologist:  None   HPI    [name] is a 77 y.o. male with past history significant for Parkinson's disease, insulin-dependent type 2 diabetes, hypertension, recurrent ESBL UTI, acute combined systolic and diastolic heart failure presents today for hospital follow-up.  The patient was recently in the ED 04/29/2022 with symptoms of general malaise and progressive weakness.  He was recently hospitalized 7/7 to 7/9 for the same treated with meropenem, has been weaker since then.  He was brought to the ED 7/16 with significant weakness, febrile to 101.4, UA abnormal again, chest x-ray was unremarkable, also followed by ID for recurrent ESBL UTIs.  He has a history of acute combined CHF echocardiogram showed EF 45 to 50% which is a decline from previous readings.  Cardiology was consulted at this time and was diuresed with IV Lasix, eventually transition to p.o.  Started on metoprolol, ARB, Jardiance.  Plans for an outpatient ischemic workup.  He was last seen by me 05/22/22 and he has had multiple recurrent ESBL UTIs and has been on IV antibiotics multiple times this year.  He was seeing Dr. Alvester Morin.  He had passed a kidney stone and fractured his back.  He had episode of dehydration and was hospitalized with IV fluids.  Ultimately, an echocardiogram was performed for his weakness which showed EF 45 to 50% which was a decline from previous readings.  He was diuresed using IV Lasix and was transitioned to p.o. on discharge.  Since discharge she has been doing fairly well.  He has not had any chest pain or shortness of breath but due to his decline in EF we will plan to do an ischemic workup with coronary CTA.  We also discussed possibly transitioning to [facility]  but will readdress this after his coronary CTA.  He was seen 11/23, he comes in today to review his CTA results.  Since he is not having symptoms we have decided against ordering a cardiac cath at this time.  He is having eye surgery and is worried because while under medication to get the other eye fixed (cataracts) he had an episode of bradycardia, rate 21 bpm. This was quickly resolved. He otherwise had not had any symptoms of dizziness or lightheadedness. No syncope or presyncope. He has chronic lower extremity edema which has been there for years since his bilateral knee surgeries. His ASA was discontinued since he was having some bleeding in his urine. This has resolved and I have encouraged him to start back on it. Of course, if he has bleeding again he will need to discontinue.   Plan was for him to have repeat echocardiogram to determine if any improvement in EF.  Seen by Dr. Graciela Husbands 3/20 and reported mild lightheadedness.  Given his known LV dysfunction and abnormal FFR decision was  made to arrange cardiac catheterization.  Severe multivessel stenosis was noted and patient had PCI/DES x 1 to M LAD, DES x 1 to M LCx and DES x 2 to [facility].  Plan for DAPT with ASA/Plavix for at least 6 months.  Seen by cardiac rehab.  Given history of UTIs, Jardiance was stopped  Today, he ***   Past Medical History    Past Medical History:  Diagnosis Date   Abnormal involuntary movement 02/18/2018   Allergic rhinitis    Arthritis    BPH (benign prostatic hyperplasia)    Diabetes    Dysphagia, pharyngeal phase    GERD diagnosed on barium swallow. Has small hiatal hernia. Symptomatically somewhat better on omeprazole but not entirely. We'll try b.i.d. therapy   Edema    1+ in both ankles, likely multifactorial including medication such as Requip   Erectile dysfunction    Staxyn 10 mg or Viagra worked well. 3 samples of Cialis 20 mg provided   GERD (gastroesophageal reflux disease)    Hypercholesteremia     Hypertension    Nephrolithiasis    Onychomycosis of toenail    April 27, 2013 - Dr. Merwyn Katos - podiatry, was in [place] - treating with oral Lamisil and topical nail therapy   Parkinson's disease     followed by Dr. Raquel Sarna at [facility] and Lesia Sago, M.D. in [place]   Presbycusis    and tinnitus - Dr. Serena Colonel - August/2013   Renal calculus    Syncope    Past Surgical History:  Procedure Laterality Date   CORONARY STENT INTERVENTION N/A 01/11/2023   Procedure: CORONARY STENT INTERVENTION;  Surgeon: Kathleene Hazel, MD;  Location: [facility];  Service: Cardiovascular;  Laterality: N/A;   HAND SURGERY     INGUINAL HERNIA REPAIR Left 11/04/2015   Procedure: LEFT INGUINAL HERNIA REPAIR WITH MESH;  Surgeon: Darnell Level, MD;  Location: [facility];  Service: General;  Laterality: Left;   INSERTION OF MESH Left 11/04/2015   Procedure: INSERTION OF MESH;  Surgeon: Darnell Level, MD;  Location: [facility];  Service: General;  Laterality: Left;   JOINT REPLACEMENT Bilateral    KNEE SURGERY     LEFT HEART CATH AND CORONARY ANGIOGRAPHY N/A 01/11/2023   Procedure: LEFT HEART CATH AND CORONARY ANGIOGRAPHY;  Surgeon: Kathleene Hazel, MD;  Location: [facility];  Service: Cardiovascular;  Laterality: N/A;   TOTAL KNEE ARTHROPLASTY      Allergies  Allergies  Allergen Reactions   Nucynta [Tapentadol] Other (See Comments)    Made the patient not feel well. "He did not feel well at all."   Oxycodone Itching   Sudafed [Pseudoephedrine Hcl] Other (See Comments)    Problems urinating    Tramadol Hcl Itching and Other (See Comments)    Can tolerate, however (in 2023)    EKGs/Labs/Other Studies Reviewed:   The following studies were reviewed today:  Cardiac Catheterization 01/11/2023:     Mid RCA lesion is 99% stenosed.   Prox RCA to Mid RCA lesion is 60% stenosed.   Mid Cx lesion is 99% stenosed.   Mid LAD lesion is 95%  stenosed.   A drug-eluting stent was successfully placed using a SYNERGY XD 2.75X16.   A drug-eluting stent was successfully placed using a SYNERGY XD 2.75X32.   A drug-eluting stent was successfully placed using a SYNERGY XD 2.25X12.   A drug-eluting stent was successfully placed using a SYNERGY XD 2.50X20.   Post intervention, there is a 0% residual stenosis.   Post intervention, there is a 0% residual stenosis.   Post intervention, there is a 0% residual stenosis.   Post intervention, there is a 0% residual stenosis.   Severe mid LAD stenosis. Successful PTCA/DES x 1 mid LAD Severe mid Circumflex stenosis. Successful PTCA/DES x 1 mid Circumflex Severe mid RCA stenosis. Successful PTCA/DES x 2 mid RCA  Normal LVEDP   Recommendations: Will discharge home today after 6 hours with same day post PCI discharge planning. ASA/Plavix for at least six months. Continue statin. Hold metformin 48 hours post cath. Stop Prilosec since we started Plavix. (He rarely takes Prilosec per family).    Diagnostic Dominance: Right  Intervention       Echocardiogram 05/04/2022  IMPRESSIONS     1. Inferior septal and apical hypokinesis . Left ventricular ejection  fraction, by estimation, is 45 to 50%. The left ventricle has mildly  decreased function. The left ventricle demonstrates regional wall motion  abnormalities (see scoring  diagram/findings for description). The left ventricular internal cavity  size was moderately dilated. There is moderate left ventricular  hypertrophy. Left ventricular diastolic parameters are indeterminate.   2. Right ventricular systolic function is normal. The right ventricular  size is normal.   3. Left atrial size was mild to moderately dilated.   4. The mitral valve is abnormal. Trivial mitral valve regurgitation. No  evidence of mitral stenosis.   5. The aortic valve is tricuspid. There is mild calcification of the  aortic valve. There is mild thickening of the  aortic valve. Aortic valve  regurgitation is not visualized. Aortic valve sclerosis is present, with  no evidence of aortic valve stenosis.   6. The inferior vena cava is dilated in size with >50% respiratory  variability, suggesting right atrial pressure of 8 mmHg.   FINDINGS   Left Ventricle: Inferior septal and apical hypokinesis. Left ventricular  ejection fraction, by estimation, is 45 to 50%. The left ventricle has  mildly decreased function. The left ventricle demonstrates regional wall  motion abnormalities. The left  ventricular internal cavity size was moderately dilated. There is moderate  left ventricular hypertrophy. Left ventricular diastolic parameters are  indeterminate.   Right Ventricle: The right ventricular size is normal. No increase in  right ventricular wall thickness. Right ventricular systolic function is  normal.   Left Atrium: Left atrial size was mild to moderately dilated.   Right Atrium: Right atrial size was normal in size.   Pericardium: There is no evidence of pericardial effusion.   Mitral Valve: The mitral valve is abnormal. There is mild thickening of  the mitral valve leaflet(s). There is mild calcification of the mitral  valve leaflet(s). Trivial mitral valve regurgitation. No evidence of  mitral valve stenosis.   Tricuspid Valve: The tricuspid valve is normal in structure. Tricuspid  valve regurgitation is mild . No evidence of tricuspid stenosis.   Aortic Valve: The aortic valve is tricuspid. There is mild calcification  of the aortic valve. There is mild thickening of the aortic valve. Aortic  valve regurgitation is not visualized. Aortic valve sclerosis is present,  with no evidence of aortic valve  stenosis. Aortic valve peak gradient measures 8.6 mmHg.   Pulmonic Valve: The pulmonic valve was normal in structure. Pulmonic valve  regurgitation is not visualized. No evidence of pulmonic stenosis.   Aorta: The aortic root is normal in  size and structure.   Venous: The inferior vena cava is dilated in size with greater than 50%  respiratory variability, suggesting right atrial pressure of 8 mmHg.   IAS/Shunts: No atrial level shunt detected by color flow Doppler.    EKG:  EKG is ordered today and showed NSR rate 70 bpm. Incomplete RBBB (chronic)   Recent Labs: 05/07/2022: B Natriuretic Peptide 266.5 08/21/2022: ALT 4 01/02/2023: BUN 22; Creatinine, Ser 1.13; Hemoglobin 15.6; Magnesium 2.2; Platelets 222; Potassium  4.2; Sodium 136  Recent Lipid Panel No results found for: "CHOL", "TRIG", "HDL", "CHOLHDL", "VLDL", "LDLCALC", "LDLDIRECT"  Home Medications   No outpatient medications have been marked as taking for the 01/23/23 encounter (Appointment) with Sharlene Dory, PA-C.     Review of Systems      All other systems reviewed and are otherwise negative except as noted above.  Physical Exam    VS:  There were no vitals taken for this visit. , BMI There is no height or weight on file to calculate BMI.  Wt Readings from Last 3 Encounters:  01/11/23 187 lb (84.8 kg)  09/26/22 198 lb (89.8 kg)  09/03/22 196 lb 6.4 oz (89.1 kg)     GEN: Well nourished, well developed, in no acute distress. HEENT: normal. Neck: Supple, no JVD, carotid bruits, or masses. Cardiac: RRR, no murmurs, rubs, or gallops. No clubbing, cyanosis, 2 + pitting edema on left, 1+ edema on right (chronic).  Radials/PT 2+ and equal bilaterally.  Respiratory:  Respirations regular and unlabored, clear to auscultation bilaterally. GI: Soft, nontender, nondistended. MS: No deformity or atrophy. Skin: Warm and dry, no rash. Neuro:  Strength and sensation are intact. Psych: Normal affect.  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 Alvester Morin so stopped ASA 81 , restart since that has resolved -Hold Metoprolol the morning of eye surgery due to episode of transient bradycardia (rate 21 bpm) -Edema the same it has been since his bilateral knee surgeries -daily weights -If he gains more than 3 lbs overnight or 5 lbs in a week, please call the office  HTN -currently on Entresto and Toprol XL and well controlled -BP cuff was tested today and running below our cuff -well controlled today in the clinic  Diabetes mellitus -continue current medication regimen -A1C 6.4  Recurrent ESBL UTI -per primary, this is his main concern -He has an appointment with Dr. Alvester Morin and follows ID outpatient   {The patient has an active order for outpatient cardiac rehabilitation.   Please indicate if the patient is ready to start. Do NOT delete this.  It will auto delete.  Refresh note, then sign.              Click here to document readiness and see contraindications.  :1}  Cardiac Rehabilitation Eligibility Assessment     Disposition: Follow up after 6 months with Donato Schultz, MD or APP.  Signed, Sharlene Dory, PA-C 01/22/2023, 7:57 PM [facility]

## 2023-01-23 ENCOUNTER — Ambulatory Visit: Payer: Medicare Other | Attending: Physician Assistant | Admitting: Physician Assistant

## 2023-01-23 ENCOUNTER — Encounter: Payer: Self-pay | Admitting: Physician Assistant

## 2023-01-23 VITALS — BP 110/68 | HR 57 | Ht 72.0 in | Wt 198.4 lb

## 2023-01-23 DIAGNOSIS — N390 Urinary tract infection, site not specified: Secondary | ICD-10-CM | POA: Diagnosis not present

## 2023-01-23 DIAGNOSIS — Z79899 Other long term (current) drug therapy: Secondary | ICD-10-CM

## 2023-01-23 DIAGNOSIS — I10 Essential (primary) hypertension: Secondary | ICD-10-CM

## 2023-01-23 DIAGNOSIS — I2583 Coronary atherosclerosis due to lipid rich plaque: Secondary | ICD-10-CM

## 2023-01-23 DIAGNOSIS — I5020 Unspecified systolic (congestive) heart failure: Secondary | ICD-10-CM | POA: Diagnosis not present

## 2023-01-23 DIAGNOSIS — I2510 Atherosclerotic heart disease of native coronary artery without angina pectoris: Secondary | ICD-10-CM | POA: Diagnosis not present

## 2023-01-23 NOTE — Patient Instructions (Signed)
Medication Instructions:  Your physician recommends that you continue on your current medications as directed. Please refer to the Current Medication list given to you today.  *If you need a refill on your cardiac medications before your next appointment, please call your pharmacy*  Lab Work: None ordered If you have labs (blood work) drawn today and your tests are completely normal, you will receive your results only by: MyChart Message (if you have MyChart) OR A paper copy in the mail If you have any lab test that is abnormal or we need to change your treatment, we will call you to review the results.  Testing/Procedures: Your physician has requested that you have an echocardiogram end of June 2024. Echocardiography is a painless test that uses sound waves to create images of your heart. It provides your doctor with information about the size and shape of your heart and how well your heart's chambers and valves are working. This procedure takes approximately one hour. There are no restrictions for this procedure. Please do NOT wear cologne, perfume, aftershave, or lotions (deodorant is allowed). Please arrive 15 minutes prior to your appointment time.    Follow-Up: At [facility], you and your health needs are our priority.  As part of our continuing mission to provide you with exceptional heart care, we have created designated Provider Care Teams.  These Care Teams include your primary Cardiologist (physician) and Advanced Practice Providers (APPs -  Physician Assistants and Nurse Practitioners) who all work together to provide you with the care you need, when you need it.  Your next appointment:   4 month(s)  Provider:   Donato Schultz, [name]    Low-Sodium Eating Plan Sodium, which is an element that makes up salt, helps you maintain a healthy balance of fluids in your body. Too much sodium can increase your blood pressure and cause fluid and waste to be held in your body. Your  health care provider or dietitian may recommend following this plan if you have high blood pressure (hypertension), kidney disease, liver disease, or heart failure. Eating less sodium can help lower your blood pressure, reduce swelling, and protect your heart, liver, and kidneys. What are tips for following this plan? Reading food labels The Nutrition Facts label lists the amount of sodium in one serving of the food. If you eat more than one serving, you must multiply the listed amount of sodium by the number of servings. Choose foods with less than 140 mg of sodium per serving. Avoid foods with 300 mg of sodium or more per serving. Shopping  Look for lower-sodium products, often labeled as "low-sodium" or "no salt added." Always check the sodium content, even if foods are labeled as "unsalted" or "no salt added." Buy fresh foods. Avoid canned foods and pre-made or frozen meals. Avoid canned, cured, or processed meats. Buy breads that have less than 80 mg of sodium per slice. Cooking  Eat more home-cooked food and less restaurant, buffet, and fast food. Avoid adding salt when cooking. Use salt-free seasonings or herbs instead of table salt or sea salt. Check with your health care provider or pharmacist before using salt substitutes. Cook with plant-based oils, such as canola, sunflower, or olive oil. Meal planning When eating at a restaurant, ask that your food be prepared with less salt or no salt, if possible. Avoid dishes labeled as brined, pickled, cured, smoked, or made with soy sauce, miso, or teriyaki sauce. Avoid foods that contain MSG (monosodium glutamate). MSG is sometimes  added to [place] food, bouillon, and some canned foods. Make meals that can be grilled, baked, poached, roasted, or steamed. These are generally made with less sodium. General information Most people on this plan should limit their sodium intake to 1,500-2,000 mg (milligrams) of sodium each day. What foods  should I eat? Fruits Fresh, frozen, or canned fruit. Fruit juice. Vegetables Fresh or frozen vegetables. "No salt added" canned vegetables. "No salt added" tomato sauce and paste. Low-sodium or reduced-sodium tomato and vegetable juice. Grains Low-sodium cereals, including oats, puffed wheat and rice, and shredded wheat. Low-sodium crackers. Unsalted rice. Unsalted pasta. Low-sodium bread. Whole-grain breads and whole-grain pasta. Meats and other proteins Fresh or frozen (no salt added) meat, poultry, seafood, and fish. Low-sodium canned tuna and salmon. Unsalted nuts. Dried peas, beans, and lentils without added salt. Unsalted canned beans. Eggs. Unsalted nut butters. Dairy Milk. Soy milk. Cheese that is naturally low in sodium, such as ricotta cheese, fresh mozzarella, or Swiss cheese. Low-sodium or reduced-sodium cheese. Cream cheese. Yogurt. Seasonings and condiments Fresh and dried herbs and spices. Salt-free seasonings. Low-sodium mustard and ketchup. Sodium-free salad dressing. Sodium-free light mayonnaise. Fresh or refrigerated horseradish. Lemon juice. Vinegar. Other foods Homemade, reduced-sodium, or low-sodium soups. Unsalted popcorn and pretzels. Low-salt or salt-free chips. The items listed above may not be a complete list of foods and beverages you can eat. Contact a dietitian for more information. What foods should I avoid? Vegetables Sauerkraut, pickled vegetables, and relishes. Olives. [place] fries. Onion rings. Regular canned vegetables (not low-sodium or reduced-sodium). Regular canned tomato sauce and paste (not low-sodium or reduced-sodium). Regular tomato and vegetable juice (not low-sodium or reduced-sodium). Frozen vegetables in sauces. Grains Instant hot cereals. Bread stuffing, pancake, and biscuit mixes. Croutons. Seasoned rice or pasta mixes. Noodle soup cups. Boxed or frozen macaroni and cheese. Regular salted crackers. Self-rising flour. Meats and other  proteins Meat or fish that is salted, canned, smoked, spiced, or pickled. Precooked or cured meat, such as sausages or meat loaves. [name]. Ham. Pepperoni. Hot dogs. Corned beef. Chipped beef. Salt pork. Jerky. Pickled herring. Anchovies and sardines. Regular canned tuna. Salted nuts. Dairy Processed cheese and cheese spreads. Hard cheeses. Cheese curds. Blue cheese. Feta cheese. String cheese. Regular cottage cheese. Buttermilk. Canned milk. Fats and oils Salted butter. Regular margarine. Ghee. Bacon fat. Seasonings and condiments Onion salt, garlic salt, seasoned salt, table salt, and sea salt. Canned and packaged gravies. Worcestershire sauce. Tartar sauce. Barbecue sauce. Teriyaki sauce. Soy sauce, including reduced-sodium. Steak sauce. Fish sauce. Oyster sauce. Cocktail sauce. Horseradish that you find on the shelf. Regular ketchup and mustard. Meat flavorings and tenderizers. Bouillon cubes. Hot sauce. Pre-made or packaged marinades. Pre-made or packaged taco seasonings. Relishes. Regular salad dressings. Salsa. Other foods Salted popcorn and pretzels. Corn chips and puffs. Potato and tortilla chips. Canned or dried soups. Pizza. Frozen entrees and pot pies. The items listed above may not be a complete list of foods and beverages you should avoid. Contact a dietitian for more information. Summary Eating less sodium can help lower your blood pressure, reduce swelling, and protect your heart, liver, and kidneys. Most people on this plan should limit their sodium intake to 1,500-2,000 mg (milligrams) of sodium each day. Canned, boxed, and frozen foods are high in sodium. Restaurant foods, fast foods, and pizza are also very high in sodium. You also get sodium by adding salt to food. Try to cook at home, eat more fresh fruits and vegetables, and eat less fast food and canned, processed,  or prepared foods. This information is not intended to replace advice given to you by your health care provider.  Make sure you discuss any questions you have with your health care provider. Document Revised: 09/07/2019 Document Reviewed: 09/02/2019 Elsevier Patient Education  2023 Elsevier Inc.  Heart-Healthy Eating Plan Many factors influence your heart health, including eating and exercise habits. Heart health is also called coronary health. Coronary risk increases with abnormal blood fat (lipid) levels. A heart-healthy eating plan includes limiting unhealthy fats, increasing healthy fats, limiting salt (sodium) intake, and making other diet and lifestyle changes. What is my plan? Your health care provider may recommend that: You limit your fat intake to _________% or less of your total calories each day. You limit your saturated fat intake to _________% or less of your total calories each day. You limit the amount of cholesterol in your diet to less than _________ mg per day. You limit the amount of sodium in your diet to less than _________ mg per day. What are tips for following this plan? Cooking Cook foods using methods other than frying. Baking, boiling, grilling, and broiling are all good options. Other ways to reduce fat include: Removing the skin from poultry. Removing all visible fats from meats. Steaming vegetables in water or broth. Meal planning  At meals, imagine dividing your plate into fourths: Fill one-half of your plate with vegetables and green salads. Fill one-fourth of your plate with whole grains. Fill one-fourth of your plate with lean protein foods. Eat 2-4 cups of vegetables per day. One cup of vegetables equals 1 cup (91 g) broccoli or cauliflower florets, 2 medium carrots, 1 large bell pepper, 1 large sweet potato, 1 large tomato, 1 medium white potato, 2 cups (150 g) raw leafy greens. Eat 1-2 cups of fruit per day. One cup of fruit equals 1 small apple, 1 large banana, 1 cup (237 g) mixed fruit, 1 large orange,  cup (82 g) dried fruit, 1 cup (240 mL) 100% fruit  juice. Eat more foods that contain soluble fiber. Examples include apples, broccoli, carrots, beans, peas, and barley. Aim to get 25-30 g of fiber per day. Increase your consumption of legumes, nuts, and seeds to 4-5 servings per week. One serving of dried beans or legumes equals  cup (90 g) cooked, 1 serving of nuts is  oz (12 almonds, 24 pistachios, or 7 walnut halves), and 1 serving of seeds equals  oz (8 g). Fats Choose healthy fats more often. Choose monounsaturated and polyunsaturated fats, such as olive and canola oils, avocado oil, flaxseeds, walnuts, almonds, and seeds. Eat more omega-3 fats. Choose salmon, mackerel, sardines, tuna, flaxseed oil, and ground flaxseeds. Aim to eat fish at least 2 times each week. Check food labels carefully to identify foods with trans fats or high amounts of saturated fat. Limit saturated fats. These are found in animal products, such as meats, butter, and cream. Plant sources of saturated fats include palm oil, palm kernel oil, and coconut oil. Avoid foods with partially hydrogenated oils in them. These contain trans fats. Examples are stick margarine, some tub margarines, cookies, crackers, and other baked goods. Avoid fried foods. General information Eat more home-cooked food and less restaurant, buffet, and fast food. Limit or avoid alcohol. Limit foods that are high in added sugar and simple starches such as foods made using white refined flour (white breads, pastries, sweets). Lose weight if you are overweight. Losing just 5-10% of your body weight can help your overall health and  prevent diseases such as diabetes and heart disease. Monitor your sodium intake, especially if you have high blood pressure. Talk with your health care provider about your sodium intake. Try to incorporate more vegetarian meals weekly. What foods should I eat? Fruits All fresh, canned (in natural juice), or frozen fruits. Vegetables Fresh or frozen vegetables (raw,  steamed, roasted, or grilled). Green salads. Grains Most grains. Choose whole wheat and whole grains most of the time. Rice and pasta, including brown rice and pastas made with whole wheat. Meats and other proteins Lean, well-trimmed beef, veal, pork, and lamb. Chicken and [place] without skin. All fish and shellfish. Wild duck, rabbit, pheasant, and venison. Egg whites or low-cholesterol egg substitutes. Dried beans, peas, lentils, and tofu. Seeds and most nuts. Dairy Low-fat or nonfat cheeses, including ricotta and mozzarella. Skim or 1% milk (liquid, powdered, or evaporated). Buttermilk made with low-fat milk. Nonfat or low-fat yogurt. Fats and oils Non-hydrogenated (trans-free) margarines. Vegetable oils, including soybean, sesame, sunflower, olive, avocado, peanut, safflower, corn, canola, and cottonseed. Salad dressings or mayonnaise made with a vegetable oil. Beverages Water (mineral or sparkling). Coffee and tea. Unsweetened ice tea. Diet beverages. Sweets and desserts Sherbet, gelatin, and fruit ice. Small amounts of dark chocolate. Limit all sweets and desserts. Seasonings and condiments All seasonings and condiments. The items listed above may not be a complete list of foods and beverages you can eat. Contact a dietitian for more options. What foods should I avoid? Fruits Canned fruit in heavy syrup. Fruit in cream or butter sauce. Fried fruit. Limit coconut. Vegetables Vegetables cooked in cheese, cream, or butter sauce. Fried vegetables. Grains Breads made with saturated or trans fats, oils, or whole milk. Croissants. Sweet rolls. Donuts. High-fat crackers, such as cheese crackers and chips. Meats and other proteins Fatty meats, such as hot dogs, ribs, sausage, bacon, rib-eye roast or steak. High-fat deli meats, such as salami and bologna. Caviar. Domestic duck and goose. Organ meats, such as liver. Dairy Cream, sour cream, cream cheese, and creamed cottage cheese. Whole-milk  cheeses. Whole or 2% milk (liquid, evaporated, or condensed). Whole buttermilk. Cream sauce or high-fat cheese sauce. Whole-milk yogurt. Fats and oils Meat fat, or shortening. Cocoa butter, hydrogenated oils, palm oil, coconut oil, palm kernel oil. Solid fats and shortenings, including bacon fat, salt pork, lard, and butter. Nondairy cream substitutes. Salad dressings with cheese or sour cream. Beverages Regular sodas and any drinks with added sugar. Sweets and desserts Frosting. Pudding. Cookies. Cakes. Pies. Milk chocolate or white chocolate. Buttered syrups. Full-fat ice cream or ice cream drinks. The items listed above may not be a complete list of foods and beverages to avoid. Contact a dietitian for more information. Summary Heart-healthy meal planning includes limiting unhealthy fats, increasing healthy fats, limiting salt (sodium) intake and making other diet and lifestyle changes. Lose weight if you are overweight. Losing just 5-10% of your body weight can help your overall health and prevent diseases such as diabetes and heart disease. Focus on eating a balance of foods, including fruits and vegetables, low-fat or nonfat dairy, lean protein, nuts and legumes, whole grains, and heart-healthy oils and fats. This information is not intended to replace advice given to you by your health care provider. Make sure you discuss any questions you have with your health care provider. Document Revised: 11/06/2021 Document Reviewed: 11/06/2021 Elsevier Patient Education  2023 [employer]

## 2023-01-25 ENCOUNTER — Telehealth (HOSPITAL_COMMUNITY): Payer: Self-pay

## 2023-01-25 ENCOUNTER — Encounter (HOSPITAL_COMMUNITY): Payer: Self-pay

## 2023-01-25 NOTE — Telephone Encounter (Signed)
Attempted to call patient in regards to Cardiac Rehab - LM on VM Mailed letter 

## 2023-01-25 NOTE — Telephone Encounter (Signed)
Pt insurance is active and benefits verified through [facility]. Co-pay $0.00, DED $0.00/$0.00 met, out of pocket $3,800.00/$228.28 met, co-insurance 0%. No pre-authorization required. Passport, 01/25/23 @ 3:10PM, REF#[id number]   How many CR sessions are covered? (36 sessions for TCR, 72 sessions for ICR)72 Is this a lifetime maximum or an annual maximum? Annual Has the member used any of these services to date? No Is there a time limit (weeks/months) on start of program and/or program completion? No     Will contact patient to see if he is interested in the Cardiac Rehab Program.

## 2023-02-05 ENCOUNTER — Other Ambulatory Visit: Payer: Self-pay | Admitting: Neurology

## 2023-02-05 DIAGNOSIS — G20A1 Parkinson's disease without dyskinesia, without mention of fluctuations: Secondary | ICD-10-CM

## 2023-02-11 ENCOUNTER — Other Ambulatory Visit: Payer: Self-pay | Admitting: Cardiology

## 2023-02-18 ENCOUNTER — Telehealth (HOSPITAL_COMMUNITY): Payer: Self-pay

## 2023-02-18 NOTE — Telephone Encounter (Signed)
No response from pt.  Closed referral  

## 2023-02-28 DIAGNOSIS — M25551 Pain in right hip: Secondary | ICD-10-CM | POA: Diagnosis not present

## 2023-03-01 ENCOUNTER — Other Ambulatory Visit: Payer: Self-pay | Admitting: Neurology

## 2023-03-01 DIAGNOSIS — G20B1 Parkinson's disease with dyskinesia, without mention of fluctuations: Secondary | ICD-10-CM

## 2023-03-08 DIAGNOSIS — E7800 Pure hypercholesterolemia, unspecified: Secondary | ICD-10-CM | POA: Diagnosis not present

## 2023-03-08 DIAGNOSIS — K219 Gastro-esophageal reflux disease without esophagitis: Secondary | ICD-10-CM | POA: Diagnosis not present

## 2023-03-08 DIAGNOSIS — I10 Essential (primary) hypertension: Secondary | ICD-10-CM | POA: Diagnosis not present

## 2023-03-08 DIAGNOSIS — N1831 Chronic kidney disease, stage 3a: Secondary | ICD-10-CM | POA: Diagnosis not present

## 2023-03-08 DIAGNOSIS — E1165 Type 2 diabetes mellitus with hyperglycemia: Secondary | ICD-10-CM | POA: Diagnosis not present

## 2023-03-08 DIAGNOSIS — I5040 Unspecified combined systolic (congestive) and diastolic (congestive) heart failure: Secondary | ICD-10-CM | POA: Diagnosis not present

## 2023-03-21 DIAGNOSIS — M7061 Trochanteric bursitis, right hip: Secondary | ICD-10-CM | POA: Diagnosis not present

## 2023-03-25 ENCOUNTER — Telehealth: Payer: Self-pay | Admitting: Neurology

## 2023-03-25 NOTE — Telephone Encounter (Signed)
Patients wife wanting me to take down some notes for patients upcoming appointment. Patients wife and daughter will attend. Patient suffering with severe depression but refuses to take anti depressants and will be upset if it is mentioned. Patient sleeping all day and staying up all night eating. Can not remember to take his meds at all. Patient is angry all the time.  Walks with walker with brakes on because he is afraid to fall Urinary incontinence He is still driving but is shaking badly and can't turn his head Falling several weeks  Wanting to know about stem cell research  Patentis wife is frustrated with the decline in patients PD

## 2023-03-25 NOTE — Telephone Encounter (Signed)
Pt wife is concern  about he is depressed and not sleeping well, fell 4 times in the last two days. He is not doing well please call   Pt has appt on 04-04-23 with [name]

## 2023-03-27 NOTE — Telephone Encounter (Signed)
Patients wife wanted Dr. Arbutus Leas to know this information before patients appointment

## 2023-03-28 ENCOUNTER — Other Ambulatory Visit: Payer: Self-pay | Admitting: Internal Medicine

## 2023-03-28 NOTE — Telephone Encounter (Signed)
Please advise on refill Pt does not have follow scheduled. Did you want to see pt again?

## 2023-04-01 ENCOUNTER — Other Ambulatory Visit: Payer: Self-pay

## 2023-04-01 ENCOUNTER — Emergency Department (HOSPITAL_COMMUNITY): Payer: Medicare Other

## 2023-04-01 ENCOUNTER — Encounter (HOSPITAL_COMMUNITY): Payer: Self-pay

## 2023-04-01 ENCOUNTER — Inpatient Hospital Stay (HOSPITAL_COMMUNITY)
Admission: EM | Admit: 2023-04-01 | Discharge: 2023-04-04 | DRG: 871 | Disposition: A | Discharge status: Home Health | Payer: Medicare Other | Attending: Internal Medicine | Admitting: Internal Medicine

## 2023-04-01 DIAGNOSIS — A419 Sepsis, unspecified organism: Principal | ICD-10-CM | POA: Diagnosis present

## 2023-04-01 DIAGNOSIS — I10 Essential (primary) hypertension: Secondary | ICD-10-CM | POA: Diagnosis present

## 2023-04-01 DIAGNOSIS — Z1152 Encounter for screening for COVID-19: Secondary | ICD-10-CM

## 2023-04-01 DIAGNOSIS — R7989 Other specified abnormal findings of blood chemistry: Secondary | ICD-10-CM | POA: Insufficient documentation

## 2023-04-01 DIAGNOSIS — Z7982 Long term (current) use of aspirin: Secondary | ICD-10-CM | POA: Diagnosis not present

## 2023-04-01 DIAGNOSIS — E1165 Type 2 diabetes mellitus with hyperglycemia: Secondary | ICD-10-CM | POA: Diagnosis not present

## 2023-04-01 DIAGNOSIS — N400 Enlarged prostate without lower urinary tract symptoms: Secondary | ICD-10-CM | POA: Diagnosis present

## 2023-04-01 DIAGNOSIS — N179 Acute kidney failure, unspecified: Secondary | ICD-10-CM | POA: Diagnosis not present

## 2023-04-01 DIAGNOSIS — Z79899 Other long term (current) drug therapy: Secondary | ICD-10-CM

## 2023-04-01 DIAGNOSIS — Z825 Family history of asthma and other chronic lower respiratory diseases: Secondary | ICD-10-CM | POA: Diagnosis not present

## 2023-04-01 DIAGNOSIS — Z7902 Long term (current) use of antithrombotics/antiplatelets: Secondary | ICD-10-CM

## 2023-04-01 DIAGNOSIS — N390 Urinary tract infection, site not specified: Secondary | ICD-10-CM | POA: Diagnosis not present

## 2023-04-01 DIAGNOSIS — Z1624 Resistance to multiple antibiotics: Secondary | ICD-10-CM | POA: Diagnosis not present

## 2023-04-01 DIAGNOSIS — I2489 Other forms of acute ischemic heart disease: Secondary | ICD-10-CM | POA: Diagnosis not present

## 2023-04-01 DIAGNOSIS — R410 Disorientation, unspecified: Secondary | ICD-10-CM | POA: Diagnosis present

## 2023-04-01 DIAGNOSIS — K219 Gastro-esophageal reflux disease without esophagitis: Secondary | ICD-10-CM | POA: Diagnosis present

## 2023-04-01 DIAGNOSIS — Z888 Allergy status to other drugs, medicaments and biological substances status: Secondary | ICD-10-CM

## 2023-04-01 DIAGNOSIS — G928 Other toxic encephalopathy: Secondary | ICD-10-CM | POA: Diagnosis present

## 2023-04-01 DIAGNOSIS — E7800 Pure hypercholesterolemia, unspecified: Secondary | ICD-10-CM | POA: Diagnosis present

## 2023-04-01 DIAGNOSIS — Z803 Family history of malignant neoplasm of breast: Secondary | ICD-10-CM

## 2023-04-01 DIAGNOSIS — Z794 Long term (current) use of insulin: Secondary | ICD-10-CM

## 2023-04-01 DIAGNOSIS — R6889 Other general symptoms and signs: Secondary | ICD-10-CM | POA: Diagnosis not present

## 2023-04-01 DIAGNOSIS — G2581 Restless legs syndrome: Secondary | ICD-10-CM | POA: Diagnosis not present

## 2023-04-01 DIAGNOSIS — I509 Heart failure, unspecified: Secondary | ICD-10-CM | POA: Diagnosis not present

## 2023-04-01 DIAGNOSIS — I2510 Atherosclerotic heart disease of native coronary artery without angina pectoris: Secondary | ICD-10-CM | POA: Diagnosis not present

## 2023-04-01 DIAGNOSIS — I5022 Chronic systolic (congestive) heart failure: Secondary | ICD-10-CM | POA: Diagnosis not present

## 2023-04-01 DIAGNOSIS — Z743 Need for continuous supervision: Secondary | ICD-10-CM | POA: Diagnosis not present

## 2023-04-01 DIAGNOSIS — R0902 Hypoxemia: Secondary | ICD-10-CM | POA: Diagnosis not present

## 2023-04-01 DIAGNOSIS — N3000 Acute cystitis without hematuria: Secondary | ICD-10-CM | POA: Diagnosis present

## 2023-04-01 DIAGNOSIS — Z885 Allergy status to narcotic agent status: Secondary | ICD-10-CM

## 2023-04-01 DIAGNOSIS — M810 Age-related osteoporosis without current pathological fracture: Secondary | ICD-10-CM | POA: Diagnosis not present

## 2023-04-01 DIAGNOSIS — G9341 Metabolic encephalopathy: Secondary | ICD-10-CM | POA: Diagnosis not present

## 2023-04-01 DIAGNOSIS — I110 Hypertensive heart disease with heart failure: Secondary | ICD-10-CM | POA: Diagnosis present

## 2023-04-01 DIAGNOSIS — G20A1 Parkinson's disease without dyskinesia, without mention of fluctuations: Secondary | ICD-10-CM | POA: Diagnosis present

## 2023-04-01 DIAGNOSIS — Z8249 Family history of ischemic heart disease and other diseases of the circulatory system: Secondary | ICD-10-CM | POA: Diagnosis not present

## 2023-04-01 DIAGNOSIS — R739 Hyperglycemia, unspecified: Secondary | ICD-10-CM | POA: Diagnosis not present

## 2023-04-01 DIAGNOSIS — Z955 Presence of coronary angioplasty implant and graft: Secondary | ICD-10-CM | POA: Diagnosis not present

## 2023-04-01 LAB — LACTIC ACID, PLASMA
Lactic Acid, Venous: 1.7 mmol/L (ref 0.5–1.9)
Lactic Acid, Venous: 1.5 mmol/L (ref 0.5–1.9)

## 2023-04-01 LAB — CBC WITH DIFFERENTIAL/PLATELET
Abs Immature Granulocytes: 0.04 10*3/uL (ref 0.00–0.07)
Basophils Absolute: 0 10*3/uL (ref 0.0–0.1)
Basophils Relative: 0 %
Eosinophils Absolute: 0 10*3/uL (ref 0.0–0.5)
Eosinophils Relative: 0 %
HCT: 46.5 % (ref 39.0–52.0)
Hemoglobin: 15.6 g/dL (ref 13.0–17.0)
Immature Granulocytes: 0 %
Lymphocytes Relative: 3 %
Lymphs Abs: 0.3 10*3/uL — ABNORMAL LOW (ref 0.7–4.0)
MCH: 30.8 pg (ref 26.0–34.0)
MCHC: 33.5 g/dL (ref 30.0–36.0)
MCV: 91.9 fL (ref 80.0–100.0)
Monocytes Absolute: 0.8 10*3/uL (ref 0.1–1.0)
Monocytes Relative: 7 %
Neutro Abs: 9.6 10*3/uL — ABNORMAL HIGH (ref 1.7–7.7)
Neutrophils Relative %: 90 %
Platelets: 213 10*3/uL (ref 150–400)
RBC: 5.06 MIL/uL (ref 4.22–5.81)
RDW: 13.2 % (ref 11.5–15.5)
WBC: 10.7 10*3/uL — ABNORMAL HIGH (ref 4.0–10.5)
nRBC: 0 % (ref 0.0–0.2)

## 2023-04-01 LAB — URINALYSIS, ROUTINE W REFLEX MICROSCOPIC
Bilirubin Urine: NEGATIVE
Glucose, UA: >=500 mg/dL — AB
Ketones, ur: NEGATIVE mg/dL
Nitrite: POSITIVE — AB
Protein, ur: 30 mg/dL — AB
Specific Gravity, Urine: 1.016 (ref 1.005–1.030)
pH: 6 (ref 5.0–8.0)

## 2023-04-01 LAB — CBG MONITORING, ED
Glucose-Capillary: 328 mg/dL — ABNORMAL HIGH (ref 70–99)
Glucose-Capillary: 224 mg/dL — ABNORMAL HIGH (ref 70–99)

## 2023-04-01 LAB — COMPREHENSIVE METABOLIC PANEL
ALT: 7 U/L (ref 0–44)
AST: 7 U/L — ABNORMAL LOW (ref 15–41)
Albumin: 3.6 g/dL (ref 3.5–5.0)
Alkaline Phosphatase: 105 U/L (ref 38–126)
Anion gap: 11 (ref 5–15)
BUN: 25 mg/dL — ABNORMAL HIGH (ref 8–23)
CO2: 27 mmol/L (ref 22–32)
Calcium: 8.9 mg/dL (ref 8.9–10.3)
Chloride: 96 mmol/L — ABNORMAL LOW (ref 98–111)
Creatinine, Ser: 1.37 mg/dL — ABNORMAL HIGH (ref 0.61–1.24)
GFR, Estimated: 53 mL/min — ABNORMAL LOW (ref >60)
Glucose, Bld: 369 mg/dL — ABNORMAL HIGH (ref 70–99)
Potassium: 4.1 mmol/L (ref 3.5–5.1)
Sodium: 134 mmol/L — ABNORMAL LOW (ref 135–145)
Total Bilirubin: 0.9 mg/dL (ref 0.3–1.2)
Total Protein: 6.9 g/dL (ref 6.5–8.1)

## 2023-04-01 LAB — TROPONIN I (HIGH SENSITIVITY)
Troponin I (High Sensitivity): 42 ng/L — ABNORMAL HIGH (ref <18)
Troponin I (High Sensitivity): 50 ng/L — ABNORMAL HIGH (ref <18)

## 2023-04-01 LAB — BLOOD GAS, VENOUS
Acid-Base Excess: 6.6 mmol/L — ABNORMAL HIGH (ref 0.0–2.0)
Bicarbonate: 33.4 mmol/L — ABNORMAL HIGH (ref 20.0–28.0)
O2 Saturation: 24.5 %
Patient temperature: 37
pCO2, Ven: 54 mmHg (ref 44–60)
pH, Ven: 7.4 (ref 7.25–7.43)
pO2, Ven: <31 mmHg — CL (ref 32–45)

## 2023-04-01 LAB — RESP PANEL BY RT-PCR (RSV, FLU A&B, COVID)  RVPGX2
Influenza A by PCR: NEGATIVE
Influenza B by PCR: NEGATIVE
Resp Syncytial Virus by PCR: NEGATIVE
SARS Coronavirus 2 by RT PCR: NEGATIVE

## 2023-04-01 LAB — OSMOLALITY, URINE: Osmolality, Ur: 529 mOsm/kg (ref 300–900)

## 2023-04-01 LAB — BETA-HYDROXYBUTYRIC ACID: Beta-Hydroxybutyric Acid: 0.08 mmol/L (ref 0.05–0.27)

## 2023-04-01 MED ORDER — METHENAMINE HIPPURATE 1 G PO TABS: 1.0000 g | ORAL_TABLET | Freq: Two times a day (BID) | ORAL | Status: DC

## 2023-04-01 MED ORDER — METHENAMINE MANDELATE 0.5 G PO TABS
1000.0000 mg | ORAL_TABLET | Freq: Two times a day (BID) | ORAL | Status: DC
  Administered 2023-04-02 – 2023-04-04 (×5): 1000 mg via ORAL
  Filled 2023-04-01 (×6): qty 2

## 2023-04-01 MED ORDER — TAMSULOSIN HCL 0.4 MG PO CAPS
0.4000 mg | ORAL_CAPSULE | Freq: Every day | ORAL | Status: DC
  Administered 2023-04-01 – 2023-04-04 (×4): 0.4 mg via ORAL
  Filled 2023-04-01 (×4): qty 1

## 2023-04-01 MED ORDER — ASPIRIN 81 MG PO TBEC
81.0000 mg | DELAYED_RELEASE_TABLET | Freq: Every day | ORAL | Status: DC
  Administered 2023-04-01 – 2023-04-04 (×4): 81 mg via ORAL
  Filled 2023-04-01 (×4): qty 1

## 2023-04-01 MED ORDER — CLOPIDOGREL BISULFATE 75 MG PO TABS
75.0000 mg | ORAL_TABLET | Freq: Every day | ORAL | Status: DC
  Administered 2023-04-02 – 2023-04-04 (×3): 75 mg via ORAL
  Filled 2023-04-01 (×4): qty 1

## 2023-04-01 MED ORDER — ACETAMINOPHEN 650 MG RE SUPP: 650.0000 mg | Freq: Four times a day (QID) | RECTAL | Status: DC | PRN

## 2023-04-01 MED ORDER — SODIUM CHLORIDE 0.9 % IV SOLN
1.0000 g | Freq: Three times a day (TID) | INTRAVENOUS | Status: DC
  Administered 2023-04-02 – 2023-04-03 (×4): 1 g via INTRAVENOUS
  Filled 2023-04-01 (×5): qty 20

## 2023-04-01 MED ORDER — INSULIN ASPART 100 UNIT/ML IJ SOLN
0.0000 [IU] | INTRAMUSCULAR | Status: DC
  Administered 2023-04-01: 8 [IU] via SUBCUTANEOUS
  Administered 2023-04-02: 2 [IU] via SUBCUTANEOUS
  Administered 2023-04-02: 4 [IU] via SUBCUTANEOUS
  Administered 2023-04-02: 2 [IU] via SUBCUTANEOUS
  Administered 2023-04-02: 8 [IU] via SUBCUTANEOUS
  Administered 2023-04-03: 16 [IU] via SUBCUTANEOUS
  Administered 2023-04-03: 2 [IU] via SUBCUTANEOUS
  Administered 2023-04-03 (×3): 4 [IU] via SUBCUTANEOUS
  Administered 2023-04-04: 20 [IU] via SUBCUTANEOUS
  Filled 2023-04-01: qty 0.24

## 2023-04-01 MED ORDER — ROSUVASTATIN CALCIUM 20 MG PO TABS
40.0000 mg | ORAL_TABLET | Freq: Every day | ORAL | Status: DC
  Administered 2023-04-01 – 2023-04-04 (×4): 40 mg via ORAL
  Filled 2023-04-01 (×4): qty 2

## 2023-04-01 MED ORDER — SODIUM CHLORIDE 0.9 % IV SOLN
1.0000 g | Freq: Once | INTRAVENOUS | Status: AC
  Administered 2023-04-01: 1 g via INTRAVENOUS
  Filled 2023-04-01: qty 20

## 2023-04-01 MED ORDER — ENOXAPARIN SODIUM 40 MG/0.4ML IJ SOSY
40.0000 mg | PREFILLED_SYRINGE | INTRAMUSCULAR | Status: DC
  Administered 2023-04-01 – 2023-04-03 (×3): 40 mg via SUBCUTANEOUS
  Filled 2023-04-01 (×3): qty 0.4

## 2023-04-01 MED ORDER — INSULIN DETEMIR 100 UNIT/ML ~~LOC~~ SOLN
20.0000 [IU] | Freq: Every day | SUBCUTANEOUS | Status: DC
  Administered 2023-04-02 – 2023-04-03 (×2): 20 [IU] via SUBCUTANEOUS
  Filled 2023-04-01 (×3): qty 0.2

## 2023-04-01 MED ORDER — PANTOPRAZOLE SODIUM 40 MG PO TBEC
40.0000 mg | DELAYED_RELEASE_TABLET | Freq: Every day | ORAL | Status: DC
  Administered 2023-04-01 – 2023-04-04 (×4): 40 mg via ORAL
  Filled 2023-04-01 (×4): qty 1

## 2023-04-01 MED ORDER — LACTATED RINGERS IV BOLUS
1000.0000 mL | Freq: Once | INTRAVENOUS | Status: AC
  Administered 2023-04-01: 1000 mL via INTRAVENOUS

## 2023-04-01 MED ORDER — ROPINIROLE HCL 0.25 MG PO TABS
0.5000 mg | ORAL_TABLET | Freq: Every day | ORAL | Status: DC
  Administered 2023-04-01 – 2023-04-03 (×3): 0.5 mg via ORAL
  Filled 2023-04-01: qty 2
  Filled 2023-04-01: qty 1
  Filled 2023-04-01: qty 2

## 2023-04-01 MED ORDER — CARBIDOPA-LEVODOPA 25-100 MG PO TABS
3.0000 | ORAL_TABLET | ORAL | Status: DC
  Administered 2023-04-02 – 2023-04-04 (×15): 3 via ORAL
  Filled 2023-04-01 (×16): qty 3

## 2023-04-01 MED ORDER — ACETAMINOPHEN 500 MG PO TABS
1000.0000 mg | ORAL_TABLET | ORAL | Status: AC
  Administered 2023-04-01: 1000 mg via ORAL
  Filled 2023-04-01: qty 2

## 2023-04-01 MED ORDER — ACETAMINOPHEN 325 MG PO TABS
650.0000 mg | ORAL_TABLET | Freq: Four times a day (QID) | ORAL | Status: DC | PRN
  Administered 2023-04-01 – 2023-04-02 (×2): 650 mg via ORAL
  Filled 2023-04-01 (×2): qty 2

## 2023-04-01 MED ORDER — ENTACAPONE 200 MG PO TABS
200.0000 mg | ORAL_TABLET | Freq: Three times a day (TID) | ORAL | Status: DC
  Administered 2023-04-01 – 2023-04-04 (×8): 200 mg via ORAL
  Filled 2023-04-01 (×9): qty 1

## 2023-04-01 NOTE — Progress Notes (Signed)
A consult was received from an ED physician for meropenem per pharmacy dosing.  The patient's profile has been reviewed for ht/wt/allergies/indication/available labs.   A one time order has been placed for meropenem 1 gm IV x 1 dose.    Further antibiotics/pharmacy consults should be ordered by admitting physician if indicated.                       Thank you,  Herby Abraham, Pharm.D Use secure chat for questions 04/01/2023 6:58 PM

## 2023-04-01 NOTE — ED Notes (Signed)
Unsuccessful IV attempt x2.  

## 2023-04-01 NOTE — ED Notes (Signed)
RN to room, pt and family updated on plan of care, denies any needs at this time, pharmacy in room to complete med rec.

## 2023-04-01 NOTE — ED Notes (Signed)
Patient transported to CT 

## 2023-04-01 NOTE — ED Notes (Signed)
ED TO INPATIENT HANDOFF REPORT  Name/Age/Gender [name] 77 y.o. male  Code Status Code Status History     Date Active Date Inactive Code Status Order ID Comments User Context   01/11/2023 0945 01/11/2023 2038 Full Code [id number]  Kathleene Hazel, MD Inpatient   04/29/2022 2105 05/08/2022 2221 Full Code [id number]  Anselm Jungling, DO ED   04/20/2022 2155 04/22/2022 1752 Full Code [id number]  Alberteen Sam, MD Inpatient   03/01/2022 1641 03/07/2022 1950 Full Code [id number]  Alwyn Ren, MD ED   01/02/2022 1012 01/06/2022 0112 Full Code [id number]  Glade Lloyd, MD Inpatient   12/31/2021 1938 01/02/2022 1011 DNR [id number]  Teddy Spike, DO Inpatient    Questions for Most Recent Historical Code Status (Order [id number])     Question Answer   By: Consent: discussion documented in EHR            Home/SNF/Other Home  Chief Complaint Sepsis (HCC) [A41.9]  Level of Care/Admitting Diagnosis ED Disposition     ED Disposition  Admit   Condition  --   Comment  Hospital Area: [facility] [[id number]]  Level of Care: Med-Surg [16]  May place patient in observation at [facility] or [facility] if equivalent level of care is available:: Yes  Covid Evaluation: Asymptomatic - no recent exposure (last 10 days) testing not required  Diagnosis: Sepsis [facility]) [[id number]]  Admitting Physician: Alan Mulder [[id number]]  Attending Physician: Alan Mulder [[id number]]          Medical History Past Medical History:  Diagnosis Date   Abnormal involuntary movement 02/18/2018   Allergic rhinitis    Arthritis    BPH (benign prostatic hyperplasia)    Diabetes (HCC)    Dysphagia, pharyngeal phase    GERD diagnosed on barium swallow. Has small hiatal hernia. Symptomatically somewhat better on omeprazole but not entirely. We'll try b.i.d. therapy   Edema    1+ in both ankles, likely multifactorial including medication such as Requip   Erectile dysfunction     Staxyn 10 mg or Viagra worked well. 3 samples of Cialis 20 mg provided   GERD (gastroesophageal reflux disease)    Hypercholesteremia    Hypertension    Nephrolithiasis    Onychomycosis of toenail    April 27, 2013 - Dr. Merwyn Katos - podiatry, was in [place] - treating with oral Lamisil and topical nail therapy   Parkinson's disease     followed by Dr. Raquel Sarna at [facility] and Lesia Sago, M.D. in [facility]   Presbycusis    and tinnitus - Dr. Serena Colonel - August/2013   Renal calculus    Syncope     Allergies Allergies  Allergen Reactions   Nucynta [Tapentadol] Other (See Comments)    Made the patient not feel well. "He did not feel well at all."   Oxycodone Itching   Sudafed [Pseudoephedrine Hcl] Other (See Comments)    Problems urinating    Tramadol Hcl Itching and Other (See Comments)    Can tolerate, however (in 2023)    IV Location/Drains/Wounds Patient Lines/Drains/Airways Status     Active Line/Drains/Airways     Name Placement date Placement time Site Days   Peripheral IV 04/01/23 20 G Left;Posterior Forearm 04/01/23  1634  Forearm  less than 1   External Urinary Catheter 04/01/23  1624  --  less than 1   Wound / Incision (Open or Dehisced) 03/01/22 Other (Comment) Knee Anterior;Left 03/01/22  1803  Knee  396   Wound / Incision (Open or Dehisced) 03/01/22 Other (Comment) Axilla Left 03/01/22  1804  Axilla  396   Wound / Incision (Open or Dehisced) 03/01/22 Hand Posterior;Right 03/01/22  1804  Hand  396   Wound / Incision (Open or Dehisced) 03/01/22 Other (Comment) Hand Left;Posterior 03/01/22  1805  Hand  396            Labs/Imaging Results for orders placed or performed during the hospital encounter of 04/01/23 (from the past 48 hour(s))  POC CBG, ED     Status: Abnormal   Collection Time: 04/01/23  3:41 PM  Result Value Ref Range   Glucose-Capillary 328 (H) 70 - 99 mg/dL    Comment: Glucose reference range applies only to samples taken after fasting for  at least 8 hours.  Lactic acid, plasma     Status: None   Collection Time: 04/01/23  4:00 PM  Result Value Ref Range   Lactic Acid, Venous 1.7 0.5 - 1.9 mmol/L    Comment: Performed at [facility], [address], [place], [place] [address]  Comprehensive metabolic panel     Status: Abnormal   Collection Time: 04/01/23  4:07 PM  Result Value Ref Range   Sodium 134 (L) 135 - 145 mmol/L   Potassium 4.1 3.5 - 5.1 mmol/L   Chloride 96 (L) 98 - 111 mmol/L   CO2 27 22 - 32 mmol/L   Glucose, Bld 369 (H) 70 - 99 mg/dL    Comment: Glucose reference range applies only to samples taken after fasting for at least 8 hours.   BUN 25 (H) 8 - 23 mg/dL   Creatinine, Ser 8.84 (H) 0.61 - 1.24 mg/dL   Calcium 8.9 8.9 - 16.6 mg/dL   Total Protein 6.9 6.5 - 8.1 g/dL   Albumin 3.6 3.5 - 5.0 g/dL   AST 7 (L) 15 - 41 U/L   ALT 7 0 - 44 U/L   Alkaline Phosphatase 105 38 - 126 U/L   Total Bilirubin 0.9 0.3 - 1.2 mg/dL   GFR, Estimated 53 (L) >60 mL/min    Comment: (NOTE) Calculated using the CKD-EPI Creatinine Equation (2021)    Anion gap 11 5 - 15    Comment: Performed at [facility], [address] [place], [place] [address]  CBC with Differential     Status: Abnormal   Collection Time: 04/01/23  4:07 PM  Result Value Ref Range   WBC 10.7 (H) 4.0 - 10.5 K/uL   RBC 5.06 4.22 - 5.81 MIL/uL   Hemoglobin 15.6 13.0 - 17.0 g/dL   HCT 60.1 09.3 - 23.5 %   MCV 91.9 80.0 - 100.0 fL   MCH 30.8 26.0 - 34.0 pg   MCHC 33.5 30.0 - 36.0 g/dL   RDW 57.3 22.0 - 25.4 %   Platelets 213 150 - 400 K/uL   nRBC 0.0 0.0 - 0.2 %   Neutrophils Relative % 90 %   Neutro Abs 9.6 (H) 1.7 - 7.7 K/uL   Lymphocytes Relative 3 %   Lymphs Abs 0.3 (L) 0.7 - 4.0 K/uL   Monocytes Relative 7 %   Monocytes Absolute 0.8 0.1 - 1.0 K/uL   Eosinophils Relative 0 %   Eosinophils Absolute 0.0 0.0 - 0.5 K/uL   Basophils Relative 0 %   Basophils Absolute 0.0 0.0 - 0.1 K/uL   Immature  Granulocytes 0 %   Abs Immature Granulocytes 0.04 0.00 - 0.07 K/uL  Comment: Performed at [facility], [address], [place], [place] [address]  Troponin I (High Sensitivity)     Status: Abnormal   Collection Time: 04/01/23  4:07 PM  Result Value Ref Range   Troponin I (High Sensitivity) 42 (H) <18 ng/L    Comment: (NOTE) Elevated high sensitivity troponin I (hsTnI) values and significant  changes across serial measurements may suggest ACS but many other  chronic and acute conditions are known to elevate hsTnI results.  Refer to the "Links" section for chest pain algorithms and additional  guidance. Performed at [facility], [address], [place], [place] [address]   Resp panel by RT-PCR (RSV, Flu A&B, Covid) Anterior Nasal Swab     Status: None   Collection Time: 04/01/23  4:10 PM   Specimen: Anterior Nasal Swab  Result Value Ref Range   SARS Coronavirus 2 by RT PCR NEGATIVE NEGATIVE    Comment: (NOTE) SARS-CoV-2 target nucleic acids are NOT DETECTED.  The SARS-CoV-2 RNA is generally detectable in upper respiratory specimens during the acute phase of infection. The lowest concentration of SARS-CoV-2 viral copies this assay can detect is 138 copies/mL. A negative result does not preclude SARS-Cov-2 infection and should not be used as the sole basis for treatment or other patient management decisions. A negative result may occur with  improper specimen collection/handling, submission of specimen other than nasopharyngeal swab, presence of viral mutation(s) within the areas targeted by this assay, and inadequate number of viral copies(<138 copies/mL). A negative result must be combined with clinical observations, patient history, and epidemiological information. The expected result is Negative.  Fact Sheet for Patients:  BloggerCourse.com  Fact Sheet for Healthcare Providers:   SeriousBroker.it  This test is no t yet approved or cleared by the [place] FDA and  has been authorized for detection and/or diagnosis of SARS-CoV-2 by FDA under an Emergency Use Authorization (EUA). This EUA will remain  in effect (meaning this test can be used) for the duration of the COVID-19 declaration under Section 564(b)(1) of the Act, 21 U.S.C.section 360bbb-3(b)(1), unless the authorization is terminated  or revoked sooner.       Influenza A by PCR NEGATIVE NEGATIVE   Influenza B by PCR NEGATIVE NEGATIVE    Comment: (NOTE) The Xpert Xpress SARS-CoV-2/FLU/RSV plus assay is intended as an aid in the diagnosis of influenza from Nasopharyngeal swab specimens and should not be used as a sole basis for treatment. Nasal washings and aspirates are unacceptable for Xpert Xpress SARS-CoV-2/FLU/RSV testing.  Fact Sheet for Patients: BloggerCourse.com  Fact Sheet for Healthcare Providers: SeriousBroker.it  This test is not yet approved or cleared by the [place] FDA and has been authorized for detection and/or diagnosis of SARS-CoV-2 by FDA under an Emergency Use Authorization (EUA). This EUA will remain in effect (meaning this test can be used) for the duration of the COVID-19 declaration under Section 564(b)(1) of the Act, 21 U.S.C. section 360bbb-3(b)(1), unless the authorization is terminated or revoked.     Resp Syncytial Virus by PCR NEGATIVE NEGATIVE    Comment: (NOTE) Fact Sheet for Patients: BloggerCourse.com  Fact Sheet for Healthcare Providers: SeriousBroker.it  This test is not yet approved or cleared by the [place] FDA and has been authorized for detection and/or diagnosis of SARS-CoV-2 by FDA under an Emergency Use Authorization (EUA). This EUA will remain in effect (meaning this test can be used) for the duration of  the COVID-19 declaration under Section 564(b)(1) of  the Act, 21 U.S.C. section 360bbb-3(b)(1), unless the authorization is terminated or revoked.  Performed at [facility], [address], [place], [place] [address]   Blood gas, venous (at [facility])     Status: Abnormal   Collection Time: 04/01/23  4:35 PM  Result Value Ref Range   pH, Ven 7.4 7.25 - 7.43   pCO2, Ven 54 44 - 60 mmHg   pO2, Ven <31 (LL) 32 - 45 mmHg    Comment: CRITICAL RESULT CALLED TO, READ BACK BY AND VERIFIED WITH: Augusto Garbe RN @ 1658 ON 04/01/2023 BY Deedra Ehrich, K    Bicarbonate 33.4 (H) 20.0 - 28.0 mmol/L   Acid-Base Excess 6.6 (H) 0.0 - 2.0 mmol/L   O2 Saturation 24.5 %   Patient temperature 37.0     Comment: Performed at [facility], [address] [place], [place] [address]  Lactic acid, plasma     Status: None   Collection Time: 04/01/23  6:18 PM  Result Value Ref Range   Lactic Acid, Venous 1.5 0.5 - 1.9 mmol/L    Comment: Performed at [facility], [address], [place], [place] [address]  Troponin I (High Sensitivity)     Status: Abnormal   Collection Time: 04/01/23  6:18 PM  Result Value Ref Range   Troponin I (High Sensitivity) 50 (H) <18 ng/L    Comment: (NOTE) Elevated high sensitivity troponin I (hsTnI) values and significant  changes across serial measurements may suggest ACS but many other  chronic and acute conditions are known to elevate hsTnI results.  Refer to the "Links" section for chest pain algorithms and additional  guidance. Performed at [facility], [address], [place], [place] [address]   Urinalysis, Routine w reflex microscopic -Urine, Clean Catch     Status: Abnormal   Collection Time: 04/01/23  6:44 PM  Result Value Ref Range   Color, Urine YELLOW YELLOW   APPearance HAZY (A) CLEAR   Specific Gravity, Urine 1.016 1.005 - 1.030   pH 6.0 5.0 - 8.0   Glucose, UA >=500 (A) NEGATIVE mg/dL   Hgb  urine dipstick MODERATE (A) NEGATIVE   Bilirubin Urine NEGATIVE NEGATIVE   Ketones, ur NEGATIVE NEGATIVE mg/dL   Protein, ur 30 (A) NEGATIVE mg/dL   Nitrite POSITIVE (A) NEGATIVE   Leukocytes,Ua SMALL (A) NEGATIVE   RBC / HPF 0-5 0 - 5 RBC/hpf   WBC, UA 21-50 0 - 5 WBC/hpf   Bacteria, UA MANY (A) NONE SEEN   Squamous Epithelial / HPF 0-5 0 - 5 /HPF    Comment: Performed at [facility], [address] [place], [place] [address]   CT Head Wo Contrast  Result Date: 04/01/2023 CLINICAL DATA:  ams EXAM: CT HEAD WITHOUT CONTRAST TECHNIQUE: Contiguous axial images were obtained from the base of the skull through the vertex without intravenous contrast. RADIATION DOSE REDUCTION: This exam was performed according to the departmental dose-optimization program which includes automated exposure control, adjustment of the mA and/or kV according to patient size and/or use of iterative reconstruction technique. COMPARISON:  CT head Mar 01, 2022. FINDINGS: Brain: No evidence of acute infarction, hemorrhage, hydrocephalus, extra-axial collection or mass lesion/mass effect. Cerebral atrophy. Vascular: No hyperdense vessel identified. Skull: No acute fracture. Sinuses/Orbits: Clear sinuses.  No acute orbital findings. Other: No mastoid effusions. IMPRESSION: No evidence of acute intracranial abnormality. Electronically Signed   By: Feliberto Harts M.D.   On: 04/01/2023  17:38   DG Chest Port 1 View  Result Date: 04/01/2023 CLINICAL DATA:  Altered mental status EXAM: PORTABLE CHEST 1 VIEW COMPARISON:  Chest radiograph dated 07/26/2022 FINDINGS: Normal lung volumes. No focal consolidations. No pleural effusion or pneumothorax. Enlarged cardiomediastinal silhouette is likely projectional. No acute osseous abnormality. IMPRESSION: No active disease. Electronically Signed   By: Agustin Cree M.D.   On: 04/01/2023 17:07    Pending Labs Unresulted Labs (From admission, onward)     Start     Ordered    04/01/23 1609  Osmolality, urine  Once,   URGENT        04/01/23 1609   04/01/23 1609  Beta-hydroxybutyric acid  Once,   URGENT        04/01/23 1609   04/01/23 1608  Urine Culture (for pregnant, neutropenic or urologic patients or patients with an indwelling urinary catheter)  (Septic presentation on arrival (screening labs, nursing and treatment orders for obvious sepsis))  Once,   URGENT       Question:  Indication  Answer:  Sepsis   04/01/23 1609   04/01/23 1607  Blood Culture (routine x 2)  (Septic presentation on arrival (screening labs, nursing and treatment orders for obvious sepsis))  BLOOD CULTURE X 2,   STAT      04/01/23 1609            Vitals/Pain Today's Vitals   04/01/23 1945 04/01/23 1951 04/01/23 2000 04/01/23 2030  BP: (!) 104/56  (!) 116/47 108/63  Pulse: (!) 56  62 60  Resp:   (!) 22 15  Temp:  98.8 F (37.1 C)    TempSrc:  Oral    SpO2: 95%  100% 98%  PainSc:        Isolation Precautions No active isolations  Medications Medications  acetaminophen (TYLENOL) tablet 1,000 mg (1,000 mg Oral Given 04/01/23 1627)  lactated ringers bolus 1,000 mL (1,000 mLs Intravenous New Bag/Given 04/01/23 1949)  meropenem (MERREM) 1 g in sodium chloride 0.9 % 100 mL IVPB (1 g Intravenous New Bag/Given 04/01/23 1949)    Mobility walks with device

## 2023-04-01 NOTE — ED Triage Notes (Signed)
BIBA from home for hyperglycemia and AMS. Glucose was 505, pt took insulin- Lantus at noon-  now glucose is 410, Per family, pt has been disoriented for 2 days. History of DM, Parkinsons, cardiac stent placement 4/24. 140/86 BP 74 HR 22 rr 95% room air 99 temp

## 2023-04-01 NOTE — H&P (Addendum)
History and Physical    [name] ZOX:[id number] DOB: 10/08/1946 DOA: 04/01/2023  PCP: Emilio Aspen, MD   Chief Complaint: weakness  HPI: [name] is a 77 y.o. male with medical history significant of multidrug-resistant urinary infection, hyperlipidemia, hypertension, Parkinson's, type 2 diabetes, osteoporosis who presented to the emergency department due to altered mental status and hyperglycemia.  Patient was seen orthopedics who gave him steroids for hip pain.  Patient was notably confused and altered this morning.  He was really unable to provide much history.  He was complaining of worsening weakness.  His family checked his blood sugar and was found to be 575 so was brought to the ER for further assessment.  On arrival he was febrile 101 and hemodynamically stable.  Labs were obtained which showed lactic acid 1.7, WBC 10.7, hemoglobin 15.6, troponin 42, 50.  Urinalysis positive for infection.  Blood cultures and urine cultures were sent.  Patient had CT head which showed no acute abnormalities.  Chest x-ray showed no acute changes.  Patient was admitted for complicated urinary tract infection.  Prior cultures grew Klebsiella with resistance to all antibiotics except carbapenems.  On evaluation patient did not know why he presented to the hospital.  He was somnolent and difficult to arouse.  He endorsed persistent weakness and tiredness.  He denied any abdominal pain or other symptoms.  He had a Foley catheter in place with minimal output   Review of Systems: Review of Systems  All other systems reviewed and are negative.    As per HPI otherwise 10 point review of systems negative.   Allergies  Allergen Reactions   Nucynta [Tapentadol] Other (See Comments)    Made the patient not feel well. "He did not feel well at all."   Oxycodone Itching   Sudafed [Pseudoephedrine Hcl] Other (See Comments)    Problems urinating    Tramadol Hcl Itching and Other (See Comments)    Can  tolerate, however (in 2023)    Past Medical History:  Diagnosis Date   Abnormal involuntary movement 02/18/2018   Allergic rhinitis    Arthritis    BPH (benign prostatic hyperplasia)    Diabetes (HCC)    Dysphagia, pharyngeal phase    GERD diagnosed on barium swallow. Has small hiatal hernia. Symptomatically somewhat better on omeprazole but not entirely. We'll try b.i.d. therapy   Edema    1+ in both ankles, likely multifactorial including medication such as Requip   Erectile dysfunction    Staxyn 10 mg or Viagra worked well. 3 samples of Cialis 20 mg provided   GERD (gastroesophageal reflux disease)    Hypercholesteremia    Hypertension    Nephrolithiasis    Onychomycosis of toenail    April 27, 2013 - Dr. Merwyn Katos - podiatry, was in [place] - treating with oral Lamisil and topical nail therapy   Parkinson's disease     followed by Dr. Raquel Sarna at [facility] and Lesia Sago, M.D. in [place]   Presbycusis    and tinnitus - Dr. Serena Colonel - August/2013   Renal calculus    Syncope     Past Surgical History:  Procedure Laterality Date   CORONARY STENT INTERVENTION N/A 01/11/2023   Procedure: CORONARY STENT INTERVENTION;  Surgeon: Kathleene Hazel, MD;  Location: [facility];  Service: Cardiovascular;  Laterality: N/A;   HAND SURGERY     INGUINAL HERNIA REPAIR Left 11/04/2015   Procedure: LEFT INGUINAL HERNIA REPAIR WITH MESH;  Surgeon: Darnell Level, MD;  Location: [facility];  Service: General;  Laterality: Left;   INSERTION OF MESH Left 11/04/2015   Procedure: INSERTION OF MESH;  Surgeon: Darnell Level, MD;  Location: [facility];  Service: General;  Laterality: Left;   JOINT REPLACEMENT Bilateral    KNEE SURGERY     LEFT HEART CATH AND CORONARY ANGIOGRAPHY N/A 01/11/2023   Procedure: LEFT HEART CATH AND CORONARY ANGIOGRAPHY;  Surgeon: Kathleene Hazel, MD;  Location: [facility];  Service: Cardiovascular;  Laterality: N/A;    TOTAL KNEE ARTHROPLASTY       reports that he has never smoked. He has never used smokeless tobacco. He reports that he does not drink alcohol and does not use drugs.  Family History  Problem Relation Age of Onset   Atrial fibrillation Mother    Breast cancer Mother    Emphysema Father    Heart disease Father    Cancer Brother        African Burkitt    Prior to Admission medications   Medication Sig Start Date End Date Taking? Authorizing Provider  Ascorbic Acid (VITAMIN C) 100 MG tablet Take 100 mg by mouth in the morning and at bedtime.   Yes [provider]  aspirin EC 81 MG tablet Take 81 mg by mouth daily. Swallow whole.   Yes [provider]  carbidopa-levodopa (SINEMET IR) 25-100 MG tablet TAKE 3 TABLETS BY MOUTH AT 7:00 AM, 3 TABS AT 10:00 AM, 3 TABS AT 12:00 PM, 3 TABS AT 2:00 PM, 3 TABS AT 4:00, AND 3 TABS AT 6:00 PM Patient taking differently: Take 3 tablets by mouth 6 (six) times daily. TAKE 3 TABLETS BY MOUTH AT 7:00 AM, 3 TABS AT 10:00 AM, 3 TABS AT 12:00 PM, 3 TABS AT 2:00 PM, 3 TABS AT 4:00, AND 3 TABS AT 6:00 PM 02/05/23  Yes [name], Octaviano Batty, DO  Cholecalciferol (VITAMIN D) 50 MCG (2000 UT) tablet Take 2,000 Units by mouth daily.   Yes [provider]  clopidogrel (PLAVIX) 75 MG tablet Take 1 tablet (75 mg total) by mouth daily with breakfast. 01/12/23  Yes Laverda Page B, NP  entacapone (COMTAN) 200 MG tablet TAKE 1 TABLET BY MOUTH 3 TIMES DAILY (WITH FIRST THREE DOSAGES OF CARBIDOPA/LEVODOPA). Patient taking differently: Take 200 mg by mouth 3 (three) times daily. 03/01/23  Yes [name], Rebecca S, DO  ENTRESTO 24-26 MG TAKE 1 TABLET BY MOUTH 2 TIMES DAILY. 02/11/23  Yes Jake Bathe, MD  furosemide (LASIX) 20 MG tablet Take 2 tablets (40 mg total) by mouth daily. 01/02/23  Yes Duke Salvia, MD  ibuprofen (ADVIL) 200 MG tablet Take 400 mg by mouth daily.   Yes [provider]  LANTUS SOLOSTAR 100 UNIT/ML Solostar Pen Inject 23 Units  into the skin at bedtime. 05/03/21  Yes [provider]  methenamine (HIPREX) 1 g tablet TAKE 1 TABLET BY MOUTH 2 TIMES DAILY. TAKE WITH ANY VITAMIN C, TWICE A DAY FOR UTI PREVENTION Patient taking differently: Take 1 g by mouth 2 (two) times daily with a meal. 03/28/23  Yes Vu, Gershon Mussel T, MD  metoprolol succinate (TOPROL-XL) 50 MG 24 hr tablet Take 1 tablet (50 mg total) by mouth daily. Take with or immediately following a meal. 01/02/23  Yes Duke Salvia, MD  potassium chloride (KLOR-CON) 10 MEQ tablet Take 10 mEq by mouth 2 (two) times daily. 05/18/22  Yes [provider]  rOPINIRole (  REQUIP) 3 MG tablet TAKE 1 TABLET IN THE MORNING, TAKE 1 IN THE AFTERNOON, AND 1/2 TABLET IN THE EVENING Patient taking differently: Take 3 mg by mouth at bedtime. I tablet in the Morning, 1 in the Afternoon, and 1/2 in the evening. 10/03/22  Yes [name], Octaviano Batty, DO  rosuvastatin (CRESTOR) 40 MG tablet Take 1 tablet (40 mg total) by mouth daily. 01/11/23  Yes Laverda Page B, NP  tamsulosin (FLOMAX) 0.4 MG CAPS capsule Take 0.4 mg by mouth in the morning and at bedtime. 01/24/18  Yes [provider]  triamcinolone (NASACORT) 55 MCG/ACT AERO nasal inhaler Place 2 sprays into the nose daily as needed (for allergies or rhinitis).   Yes [provider]  vitamin B-12 (CYANOCOBALAMIN) 1000 MCG tablet Take 2,000 mcg by mouth daily.   Yes [provider]  acetaminophen (TYLENOL) 500 MG tablet Take 500-1,000 mg by mouth every 6 (six) hours as needed (for pain).    [provider]  calcium carbonate (TUMS) 500 MG chewable tablet Chew 2 tablets by mouth daily as needed for indigestion or heartburn.    [provider]  GLOBAL EASE INJECT PEN NEEDLES 31G X 5 MM MISC Inject into the skin. 05/09/21   [provider]  Levodopa (INBRIJA) 42 MG CAPS Samples of this drug were given to the patient, quantity 1, Lot Number [id number] exp 12-24 09/26/22   [name], Octaviano Batty, DO   nitroGLYCERIN (NITROSTAT) 0.4 MG SL tablet Place 1 tablet (0.4 mg total) under the tongue every 5 (five) minutes as needed. 01/11/23   Arty Baumgartner, NP  pantoprazole (PROTONIX) 40 MG tablet Take 1 tablet (40 mg total) by mouth daily. 01/11/23   Arty Baumgartner, NP    Physical Exam: Vitals:   04/01/23 1951 04/01/23 2000 04/01/23 2030 04/01/23 2223  BP:  (!) 116/47 108/63   Pulse:  62 60   Resp:  (!) 22 15   Temp: 98.8 F (37.1 C)   (!) 101.2 F (38.4 C)  TempSrc: Oral   Oral  SpO2:  100% 98%    Physical Exam Vitals reviewed.  Constitutional:      Appearance: He is normal weight.  HENT:     Head: Normocephalic.     Nose: Nose normal.     Mouth/Throat:     Mouth: Mucous membranes are moist.     Pharynx: Oropharynx is clear.  Eyes:     Pupils: Pupils are equal, round, and reactive to light.  Cardiovascular:     Rate and Rhythm: Normal rate and regular rhythm.     Pulses: Normal pulses.  Pulmonary:     Effort: Pulmonary effort is normal.  Abdominal:     General: Abdomen is flat. Bowel sounds are normal.  Musculoskeletal:        General: Normal range of motion.     Cervical back: Normal range of motion.  Skin:    General: Skin is warm.     Capillary Refill: Capillary refill takes less than 2 seconds.  Neurological:     General: No focal deficit present.     Mental Status: He is alert. He is disoriented.     Motor: Weakness present.  Psychiatric:        Mood and Affect: Mood normal.        Labs on Admission: I have personally reviewed the patients's labs and imaging studies.  Assessment/Plan Principal Problem:   Sepsis (HCC) Active Problems:   Sepsis due to urinary  tract infection (HCC)   # Acute infectious encephalopathy most likely due to urinary tract infection, POA, active - Patient has history of multidrug-resistant UTIs - Cultures were resistant to all antibiotics except carbapenems - No lactic acidosis  Plan: Continue meropenem Follow-up  cultures Continue methenamine Continue Flomax  # GERD-continue Protonix  # RLS-continue Requip  # Hyperlipidemia-continue statin  #CAD status post recent heart catheterization on 3/29-patient had multivessel disease that was managed with drug-eluting stents.  Will continue patient on aspirin and Plavix  #History of Parkinson's-continue Sinemet, entacapone  # Type 2 diabetes-poorly controlled.  Will continue Levemir 20 units daily with sliding scale     Admission status: Inpatient Telemetry  Certification: The appropriate patient status for this patient is INPATIENT. Inpatient status is judged to be reasonable and necessary in order to provide the required intensity of service to ensure the patient's safety. The patient's presenting symptoms, physical exam findings, and initial radiographic and laboratory data in the context of their chronic comorbidities is felt to place them at high risk for further clinical deterioration. Furthermore, it is not anticipated that the patient will be medically stable for discharge from the hospital within 2 midnights of admission.   * I certify that at the point of admission it is my clinical judgment that the patient will require inpatient hospital care spanning beyond 2 midnights from the point of admission due to high intensity of service, high risk for further deterioration and high frequency of surveillance required.Alan Mulder MD [facility] If 7PM-7AM, please contact night-coverage [email or web address]  04/01/2023, 10:44 PM  #

## 2023-04-01 NOTE — ED Provider Notes (Signed)
[place] EMERGENCY DEPARTMENT AT [facility] Provider Note   CSN: [id number] Arrival date & time: 04/01/23  1534     History  Chief Complaint  Patient presents with   Hyperglycemia   Altered Mental Status    [name] is a 77 y.o. male.  77 year old male with a history of Parkinson's disease, insulin-dependent diabetes, hypertension, recurrent ESBL UTI, heart failure with reduced ejection fraction who presents to the emergency department with elevated blood sugar and generalized weakness.  Patient confused at this time and unable to give significant history.  Reports he is not currently in pain.  Not having any headache or neck stiffness.  Denies any abdominal pain.  History obtained per the patient's wife and his daughter who says he has been feeling weak since 1pm today. Does not check his blood sugar regularly and it was 575 at home today. Got 23 units of Lantus and it came down to 504 but was still symptomatic and was shaking with frequent urination. Only gets his blood sugar checked 1 or twice a week. Typically is around 100 for his blood sugar. Is off metformin and oral medications. Has been having frequent urination recently.  Has a history of recurrent ESBL UTI that has been treated with meropenem in the past.  Did have drug-eluting stents placed on 01/11/2023.       Home Medications Prior to Admission medications   Medication Sig Start Date End Date Taking? Authorizing Provider  Ascorbic Acid (VITAMIN C) 100 MG tablet Take 100 mg by mouth in the morning and at bedtime.   Yes [provider]  aspirin EC 81 MG tablet Take 81 mg by mouth daily. Swallow whole.   Yes [provider]  carbidopa-levodopa (SINEMET IR) 25-100 MG tablet TAKE 3 TABLETS BY MOUTH AT 7:00 AM, 3 TABS AT 10:00 AM, 3 TABS AT 12:00 PM, 3 TABS AT 2:00 PM, 3 TABS AT 4:00, AND 3 TABS AT 6:00 PM Patient taking differently: Take 3 tablets by mouth 6 (six) times daily. TAKE 3 TABLETS BY  MOUTH AT 7:00 AM, 3 TABS AT 10:00 AM, 3 TABS AT 12:00 PM, 3 TABS AT 2:00 PM, 3 TABS AT 4:00, AND 3 TABS AT 6:00 PM 02/05/23  Yes [name], Octaviano Batty, DO  Cholecalciferol (VITAMIN D) 50 MCG (2000 UT) tablet Take 2,000 Units by mouth daily.   Yes [provider]  clopidogrel (PLAVIX) 75 MG tablet Take 1 tablet (75 mg total) by mouth daily with breakfast. 01/12/23  Yes Laverda Page B, NP  entacapone (COMTAN) 200 MG tablet TAKE 1 TABLET BY MOUTH 3 TIMES DAILY (WITH FIRST THREE DOSAGES OF CARBIDOPA/LEVODOPA). Patient taking differently: Take 200 mg by mouth 3 (three) times daily. 03/01/23  Yes [name], Rebecca S, DO  ENTRESTO 24-26 MG TAKE 1 TABLET BY MOUTH 2 TIMES DAILY. 02/11/23  Yes Jake Bathe, MD  furosemide (LASIX) 20 MG tablet Take 2 tablets (40 mg total) by mouth daily. 01/02/23  Yes Duke Salvia, MD  ibuprofen (ADVIL) 200 MG tablet Take 400 mg by mouth daily.   Yes [provider]  LANTUS SOLOSTAR 100 UNIT/ML Solostar Pen Inject 23 Units into the skin at bedtime. 05/03/21  Yes [provider]  methenamine (HIPREX) 1 g tablet TAKE 1 TABLET BY MOUTH 2 TIMES DAILY. TAKE WITH ANY VITAMIN C, TWICE A DAY FOR UTI PREVENTION Patient taking differently: Take 1 g by mouth 2 (two) times daily with a meal. 03/28/23  Yes Vu, Gershon Mussel  T, MD  metoprolol succinate (TOPROL-XL) 50 MG 24 hr tablet Take 1 tablet (50 mg total) by mouth daily. Take with or immediately following a meal. 01/02/23  Yes Duke Salvia, MD  potassium chloride (KLOR-CON) 10 MEQ tablet Take 10 mEq by mouth 2 (two) times daily. 05/18/22  Yes [provider]  rOPINIRole (REQUIP) 3 MG tablet TAKE 1 TABLET IN THE MORNING, TAKE 1 IN THE AFTERNOON, AND 1/2 TABLET IN THE EVENING Patient taking differently: Take 3 mg by mouth at bedtime. I tablet in the Morning, 1 in the Afternoon, and 1/2 in the evening. 10/03/22  Yes [name], Octaviano Batty, DO  rosuvastatin (CRESTOR) 40 MG tablet Take 1 tablet (40 mg total) by mouth daily. 01/11/23   Yes Laverda Page B, NP  tamsulosin (FLOMAX) 0.4 MG CAPS capsule Take 0.4 mg by mouth in the morning and at bedtime. 01/24/18  Yes [provider]  triamcinolone (NASACORT) 55 MCG/ACT AERO nasal inhaler Place 2 sprays into the nose daily as needed (for allergies or rhinitis).   Yes [provider]  vitamin B-12 (CYANOCOBALAMIN) 1000 MCG tablet Take 2,000 mcg by mouth daily.   Yes [provider]  acetaminophen (TYLENOL) 500 MG tablet Take 500-1,000 mg by mouth every 6 (six) hours as needed (for pain).    [provider]  calcium carbonate (TUMS) 500 MG chewable tablet Chew 2 tablets by mouth daily as needed for indigestion or heartburn.    [provider]  GLOBAL EASE INJECT PEN NEEDLES 31G X 5 MM MISC Inject into the skin. 05/09/21   [provider]  Levodopa (INBRIJA) 42 MG CAPS Samples of this drug were given to the patient, quantity 1, Lot Number [id number] exp 12-24 09/26/22   [name], Octaviano Batty, DO  nitroGLYCERIN (NITROSTAT) 0.4 MG SL tablet Place 1 tablet (0.4 mg total) under the tongue every 5 (five) minutes as needed. 01/11/23   Arty Baumgartner, NP  pantoprazole (PROTONIX) 40 MG tablet Take 1 tablet (40 mg total) by mouth daily. 01/11/23   Arty Baumgartner, NP      Allergies    Nucynta [tapentadol], Oxycodone, Sudafed [pseudoephedrine hcl], and Tramadol hcl    Review of Systems   Review of Systems  Physical Exam Updated Vital Signs BP (!) 116/47   Pulse 62   Temp 98.8 F (37.1 C) (Oral)   Resp (!) 22   SpO2 100%  Physical Exam Vitals and nursing note reviewed.  Constitutional:      General: He is not in acute distress.    Appearance: He is well-developed.     Comments: Alert and oriented x 2.  Thought the year was 2026.  HENT:     Head: Normocephalic and atraumatic.     Right Ear: External ear normal.     Left Ear: External ear normal.     Nose: Nose normal.  Eyes:     Extraocular Movements: Extraocular movements  intact.     Conjunctiva/sclera: Conjunctivae normal.     Pupils: Pupils are equal, round, and reactive to light.  Cardiovascular:     Rate and Rhythm: Normal rate and regular rhythm.     Heart sounds: Normal heart sounds.  Pulmonary:     Effort: Pulmonary effort is normal. No respiratory distress.     Breath sounds: Normal breath sounds.  Abdominal:     General: There is no distension.     Palpations: Abdomen is soft. There is no mass.     Tenderness:  There is no abdominal tenderness. There is no guarding.  Musculoskeletal:     Cervical back: Normal range of motion and neck supple.     Right lower leg: Edema present.     Left lower leg: Edema present.  Skin:    General: Skin is warm and dry.  Neurological:     General: No focal deficit present.     Mental Status: He is alert. Mental status is at baseline.     Cranial Nerves: No cranial nerve deficit.     Sensory: No sensory deficit.     Motor: No weakness.     ED Results / Procedures / Treatments   Labs (all labs ordered are listed, but only abnormal results are displayed) Labs Reviewed  URINALYSIS, ROUTINE W REFLEX MICROSCOPIC - Abnormal; Notable for the following components:      Result Value   APPearance HAZY (*)    Glucose, UA >=500 (*)    Hgb urine dipstick MODERATE (*)    Protein, ur 30 (*)    Nitrite POSITIVE (*)    Leukocytes,Ua SMALL (*)    Bacteria, UA MANY (*)    All other components within normal limits  COMPREHENSIVE METABOLIC PANEL - Abnormal; Notable for the following components:   Sodium 134 (*)    Chloride 96 (*)    Glucose, Bld 369 (*)    BUN 25 (*)    Creatinine, Ser 1.37 (*)    AST 7 (*)    GFR, Estimated 53 (*)    All other components within normal limits  CBC WITH DIFFERENTIAL/PLATELET - Abnormal; Notable for the following components:   WBC 10.7 (*)    Neutro Abs 9.6 (*)    Lymphs Abs 0.3 (*)    All other components within normal limits  BLOOD GAS, VENOUS - Abnormal; Notable for the  following components:   pO2, Ven <31 (*)    Bicarbonate 33.4 (*)    Acid-Base Excess 6.6 (*)    All other components within normal limits  CBG MONITORING, ED - Abnormal; Notable for the following components:   Glucose-Capillary 328 (*)    All other components within normal limits  TROPONIN I (HIGH SENSITIVITY) - Abnormal; Notable for the following components:   Troponin I (High Sensitivity) 42 (*)    All other components within normal limits  TROPONIN I (HIGH SENSITIVITY) - Abnormal; Notable for the following components:   Troponin I (High Sensitivity) 50 (*)    All other components within normal limits  RESP PANEL BY RT-PCR (RSV, FLU A&B, COVID)  RVPGX2  CULTURE, BLOOD (ROUTINE X 2)  CULTURE, BLOOD (ROUTINE X 2)  URINE CULTURE  LACTIC ACID, PLASMA  LACTIC ACID, PLASMA  OSMOLALITY, URINE  BETA-HYDROXYBUTYRIC ACID    EKG EKG Interpretation  Date/Time:  Monday April 01 2023 15:51:24 EDT Ventricular Rate:  83 PR Interval:  149 QRS Duration: 119 QT Interval:  386 QTC Calculation: 454 R Axis:   -43 Text Interpretation: Sinus rhythm Incomplete left bundle branch block Anterior Q waves, possibly due to ILBBB ST elevation, consider inferior injury Confirmed by Vonita Moss [phone]) on 04/01/2023 4:10:26 PM  Radiology CT Head Wo Contrast  Result Date: 04/01/2023 CLINICAL DATA:  ams EXAM: CT HEAD WITHOUT CONTRAST TECHNIQUE: Contiguous axial images were obtained from the base of the skull through the vertex without intravenous contrast. RADIATION DOSE REDUCTION: This exam was performed according to the departmental dose-optimization program which includes automated exposure control, adjustment of the mA and/or kV according  to patient size and/or use of iterative reconstruction technique. COMPARISON:  CT head Mar 01, 2022. FINDINGS: Brain: No evidence of acute infarction, hemorrhage, hydrocephalus, extra-axial collection or mass lesion/mass effect. Cerebral atrophy. Vascular: No  hyperdense vessel identified. Skull: No acute fracture. Sinuses/Orbits: Clear sinuses.  No acute orbital findings. Other: No mastoid effusions. IMPRESSION: No evidence of acute intracranial abnormality. Electronically Signed   By: Feliberto Harts M.D.   On: 04/01/2023 17:38   DG Chest Port 1 View  Result Date: 04/01/2023 CLINICAL DATA:  Altered mental status EXAM: PORTABLE CHEST 1 VIEW COMPARISON:  Chest radiograph dated 07/26/2022 FINDINGS: Normal lung volumes. No focal consolidations. No pleural effusion or pneumothorax. Enlarged cardiomediastinal silhouette is likely projectional. No acute osseous abnormality. IMPRESSION: No active disease. Electronically Signed   By: Agustin Cree M.D.   On: 04/01/2023 17:07    Procedures Procedures    Medications Ordered in ED Medications  acetaminophen (TYLENOL) tablet 1,000 mg (1,000 mg Oral Given 04/01/23 1627)  lactated ringers bolus 1,000 mL (1,000 mLs Intravenous New Bag/Given 04/01/23 1949)  meropenem (MERREM) 1 g in sodium chloride 0.9 % 100 mL IVPB (1 g Intravenous New Bag/Given 04/01/23 1949)    ED Course/ Medical Decision Making/ A&P Clinical Course as of 04/01/23 2020  Mon Apr 01, 2023  2017 Dr Avie Arenas from hospitalist to admit the patient. [RP]    Clinical Course User Index [RP] Rondel Baton, MD                             Medical Decision Making Amount and/or Complexity of Data Reviewed Labs: ordered. Radiology: ordered.  Risk OTC drugs. Decision regarding hospitalization.   [name] is a 77 y.o. male with comorbidities that complicate the patient evaluation including Parkinson's disease, insulin-dependent diabetes, hypertension, recurrent ESBL UTI, heart failure with reduced ejection fraction who presents to the emergency department with elevated blood sugar and generalized weakness.     Initial Ddx:  ICH, stroke, delirium, UTI, URI, pneumonia  MDM/Course:  Patient was concerned about a possible UTI causing the  patient's symptoms with his fever and confusion.  Does have a history of ESBL.  When his labs came back was found to have leukocytosis and was started on Merrem (on based on his prior urinalysis results due to presumed UTI.  Did have a cath urine specimen that was sent which is consistent with a UTI so suspect that he is septic from a urinary source.  Not having any meningismus on exam so was not concerned about meningitis.  Did have a CT of the head with his altered mental status which did not show any acute abnormalities.  No focal neurodeficits to suggest a stroke causing his symptoms.  Had a chest x-ray and COVID and flu to evaluate for pulmonary infection that were negative.  Upon re-evaluation remained stable and was admitted to medicine for further management of his altered mental status and urinary tract infection.  This patient presents to the ED for concern of complaints listed in HPI, this involves an extensive number of treatment options, and is a complaint that carries with it a high risk of complications and morbidity. Disposition including potential need for admission considered.   Dispo: Admit to Floor  Additional history obtained from daughter Records reviewed Outpatient Clinic Notes The following labs were independently interpreted: Chemistry and show CKD I independently reviewed the following imaging with scope of interpretation limited to determining acute life  threatening conditions related to emergency care: CT Head and agree with the radiologist interpretation with the following exceptions: none I personally reviewed and interpreted cardiac monitoring: normal sinus rhythm  I personally reviewed and interpreted the pt's EKG: see above for interpretation  I have reviewed the patients home medications and made adjustments as needed Consults: Hospitalist Social Determinants of health:  Elderly         Final Clinical Impression(s) / ED Diagnoses Final diagnoses:  Acute  cystitis without hematuria  Sepsis, due to unspecified organism, unspecified whether acute organ dysfunction present [facility])  Disorientation    Rx / DC Orders ED Discharge Orders     None         Rondel Baton, MD 04/01/23 2020

## 2023-04-02 ENCOUNTER — Other Ambulatory Visit (HOSPITAL_COMMUNITY): Payer: Self-pay

## 2023-04-02 ENCOUNTER — Inpatient Hospital Stay (HOSPITAL_COMMUNITY): Payer: Medicare Other

## 2023-04-02 DIAGNOSIS — N3000 Acute cystitis without hematuria: Secondary | ICD-10-CM

## 2023-04-02 DIAGNOSIS — I509 Heart failure, unspecified: Secondary | ICD-10-CM

## 2023-04-02 LAB — ECHOCARDIOGRAM COMPLETE
AR max vel: 3.23 cm2
AV Area VTI: 2.94 cm2
AV Area mean vel: 3.16 cm2
AV Mean grad: 7.3 mmHg
AV Peak grad: 11.7 mmHg
Ao pk vel: 1.71 m/s
Area-P 1/2: 3.31 cm2
Calc EF: 50.8 %
MV VTI: 4.18 cm2
S' Lateral: 3.3 cm
Single Plane A2C EF: 49.8 %
Single Plane A4C EF: 48.1 %

## 2023-04-02 LAB — BASIC METABOLIC PANEL
Anion gap: 6 (ref 5–15)
BUN: 20 mg/dL (ref 8–23)
CO2: 27 mmol/L (ref 22–32)
Calcium: 8.2 mg/dL — ABNORMAL LOW (ref 8.9–10.3)
Chloride: 103 mmol/L (ref 98–111)
Creatinine, Ser: 1.27 mg/dL — ABNORMAL HIGH (ref 0.61–1.24)
GFR, Estimated: 59 mL/min — ABNORMAL LOW (ref >60)
Glucose, Bld: 134 mg/dL — ABNORMAL HIGH (ref 70–99)
Potassium: 3.5 mmol/L (ref 3.5–5.1)
Sodium: 136 mmol/L (ref 135–145)

## 2023-04-02 LAB — CBC
HCT: 39.9 % (ref 39.0–52.0)
Hemoglobin: 13.3 g/dL (ref 13.0–17.0)
MCH: 30.4 pg (ref 26.0–34.0)
MCHC: 33.3 g/dL (ref 30.0–36.0)
MCV: 91.3 fL (ref 80.0–100.0)
Platelets: 207 10*3/uL (ref 150–400)
RBC: 4.37 MIL/uL (ref 4.22–5.81)
RDW: 13.2 % (ref 11.5–15.5)
WBC: 9.2 10*3/uL (ref 4.0–10.5)
nRBC: 0 % (ref 0.0–0.2)

## 2023-04-02 LAB — GLUCOSE, CAPILLARY
Glucose-Capillary: 109 mg/dL — ABNORMAL HIGH (ref 70–99)
Glucose-Capillary: 113 mg/dL — ABNORMAL HIGH (ref 70–99)
Glucose-Capillary: 138 mg/dL — ABNORMAL HIGH (ref 70–99)
Glucose-Capillary: 156 mg/dL — ABNORMAL HIGH (ref 70–99)
Glucose-Capillary: 184 mg/dL — ABNORMAL HIGH (ref 70–99)
Glucose-Capillary: 210 mg/dL — ABNORMAL HIGH (ref 70–99)

## 2023-04-02 LAB — HEMOGLOBIN A1C
Hgb A1c MFr Bld: 11.7 % — ABNORMAL HIGH (ref 4.8–5.6)
Mean Plasma Glucose: 289.09 mg/dL

## 2023-04-02 LAB — TROPONIN I (HIGH SENSITIVITY)
Troponin I (High Sensitivity): 30 ng/L — ABNORMAL HIGH (ref <18)
Troponin I (High Sensitivity): 35 ng/L — ABNORMAL HIGH (ref <18)

## 2023-04-02 LAB — BRAIN NATRIURETIC PEPTIDE: B Natriuretic Peptide: 660 pg/mL — ABNORMAL HIGH (ref 0.0–100.0)

## 2023-04-02 MED ORDER — SODIUM CHLORIDE 0.9 % IV SOLN: INTRAVENOUS | Status: DC

## 2023-04-02 NOTE — TOC Benefit Eligibility Note (Signed)
Pharmacy Patient Advocate Encounter  Insurance verification completed.    The patient is insured through [employer] test claim for [employer] and the current 30 day co-pay is $0.00.  Ran test claim for Dexcom G7 Sensors and the current 30 day co-pay is $0.00.  Ran test claim for Insulin Lispro and the current 30 day co-pay is $35.00.   This test claim was processed through [facility]- copay amounts may vary at other pharmacies due to pharmacy/plan contracts, or as the patient moves through the different stages of their insurance plan.

## 2023-04-02 NOTE — Progress Notes (Deleted)
Assessment/Plan:   1.  Parkinsons Disease, diagnosed 2010 with symptoms to 2008  -continue carbidopa/levodopa 25/100, 3 po qid  -Continue entacapone 200 mg with first three dosages of levodopa   -For now, we will continue ropinirole 3 mg, 1/1/0.5  -Discussed again with patient the importance of physical exercise, guided by a professional.  We discussed the programs that we have in place, but if he did not want to do those, perhaps he could hire a Systems analyst.  He is frustrated by lack of balance, and told him that the only way to improve this would be through exercises such as yoga, tai chi, etc.  I also think he needs to use his walker at all times.  -Patient was given samples of Inbrija and shown how to use that.  He can use that up to 5 times per day.  2.  History of UTI worsening parkinsonism  -Following with infectious disease closely  -follows with urology for nephrolithiasis  3.  Day/night reversal  -Long discussion with patient regarding sleep hygiene.  Discussed with him that melatonin can help him to get back on a regular cycle, but that he really needs to work on staying active during the day, with a regular schedule.  Discussed that day/night reversal really can lead to hallucinations, which ultimately can lead to discontinuation of ropinirole and subsequent worsening of motor symptoms.  4.  Severe CAD  -Cardiac cath in March, 2024 with evidence of severe dual antiplatelet therapy with aspirin/Plavix Subjective:   [name] was seen today in follow up for Parkinsons disease.  My previous records were reviewed prior to todays visit as well as outside records available to me.  Patient has continued to struggle with issues outside of Parkinsons Disease that have affected his stamina.  He had a cardiac cath at the end of March demonstrating severe multivessel stenosis.  The plan was for DAPT with aspirin/Plavix for at least 6 months.  I received a call June 10 that patient  was more depressed.  Not surprisingly, his typical pattern, he was admitted not long thereafter with acute cystitis.  Patient associated mental status change.    Current prescribed movement disorder medications: carbidopa/levodopa 25/100, 3 tablets qid (will start it at 6am) Entacapone, 200 mg with first 3 dosages of levodopa  requip 3 mg, 1 tablet in the morning, 1 in the afternoon, half tablet in the evening    PREVIOUS MEDICATIONS: neupro (helped but costly); requip; levodopa IR; amantadine (d/c when confused)  ALLERGIES:   Allergies  Allergen Reactions   Nucynta [Tapentadol] Other (See Comments)    Made the patient not feel well. "He did not feel well at all."   Oxycodone Itching   Sudafed [Pseudoephedrine Hcl] Other (See Comments)    Problems urinating    Tramadol Hcl Itching and Other (See Comments)    Can tolerate, however (in 2023)    CURRENT MEDICATIONS:  Outpatient Encounter Medications as of 09/26/2022  Medication Sig   acetaminophen (TYLENOL) 500 MG tablet Take 1,000 mg by mouth every 6 (six) hours as needed (for pain).   Ascorbic Acid (VITAMIN C) 100 MG tablet Take 100 mg by mouth in the morning and at bedtime.   aspirin EC 81 MG tablet Take 81 mg by mouth daily. Swallow whole.   calcium carbonate (TUMS) 500 MG chewable tablet Chew 2 tablets by mouth daily as needed for indigestion or heartburn.   carbidopa-levodopa (SINEMET IR) 25-100 MG tablet TAKE 3 TABLETS  BY MOUTH AT 7:00 AM, 2 TABS AT 10:00 AM, 2 TABS AT 12:00 PM, 2 TABS AT 2:00 PM, 2 TABS AT 4:00, AND 2 TABS AT 6:00 PM. (Patient taking differently: 3 tablets.)   cholecalciferol (VITAMIN D) 1000 UNITS tablet Take 2,000 Units by mouth daily.   empagliflozin (JARDIANCE) 25 MG TABS tablet Take 1 tablet (25 mg total) by mouth daily before breakfast.   entacapone (COMTAN) 200 MG tablet TAKE 1 TABLET BY MOUTH 3 TIMES DAILY (WITH FIRST THREE DOSAGES OF CARBIDOPA/LEVODOPA).   furosemide (LASIX) 20 MG tablet Take 1 tablet  (20 mg total) by mouth 2 (two) times daily.   gatifloxacin (ZYMAXID) 0.5 % SOLN SMARTSIG:In Eye(s)   GLOBAL EASE INJECT PEN NEEDLES 31G X 5 MM MISC Inject into the skin.   ketorolac (ACULAR) 0.5 % ophthalmic solution SMARTSIG:In Eye(s)   LANTUS SOLOSTAR 100 UNIT/ML Solostar Pen Inject 15-20 Units into the skin at bedtime.   metFORMIN (GLUCOPHAGE) 500 MG tablet Take 500 mg by mouth 2 (two) times daily with a meal.   methenamine (HIPREX) 1 g tablet    methenamine (MANDELAMINE) 1 g tablet Take 1 tablet (1,000 mg total) by mouth 2 (two) times daily. Take with any vitamin c, twice a day for uti prevention (Patient taking differently: Take 1,000 mg by mouth in the morning and at bedtime.)   methocarbamol (ROBAXIN) 500 MG tablet Take 1 tablet by mouth 2 (two) times daily as needed.   metoprolol succinate (TOPROL-XL) 25 MG 24 hr tablet Take 1 tablet (25 mg total) by mouth daily.   omeprazole (PRILOSEC) 20 MG capsule Take 20 mg by mouth daily as needed (acid reflux).   potassium chloride (KLOR-CON) 10 MEQ tablet Take 10 mEq by mouth 2 (two) times daily.   potassium chloride SA (KLOR-CON) 20 MEQ tablet Take 10 mEq by mouth 2 (two) times daily.   prednisoLONE acetate (PRED FORTE) 1 % ophthalmic suspension SMARTSIG:In Eye(s)   rOPINIRole (REQUIP) 3 MG tablet TAKE 1 TABLET IN THE MORNING, TAKE 1 IN THE AFTERNOON, AND 1/2 TABLET IN THE EVENING   rosuvastatin (CRESTOR) 10 MG tablet Take 1 tablet (10 mg total) by mouth daily.   sacubitril-valsartan (ENTRESTO) 24-26 MG Take 1 tablet by mouth 2 (two) times daily.   tamsulosin (FLOMAX) 0.4 MG CAPS capsule Take 0.4 mg by mouth in the morning and at bedtime.   traMADol (ULTRAM) 50 MG tablet Take 1 tablet (50 mg total) by mouth every 6 (six) hours as needed (for pain).   triamcinolone (NASACORT) 55 MCG/ACT AERO nasal inhaler Place 2 sprays into the nose daily as needed (for allergies or rhinitis).   vitamin B-12 (CYANOCOBALAMIN) 1000 MCG tablet Take 1,000 mcg by mouth  daily.   No facility-administered encounter medications on file as of 09/26/2022.    Objective:   PHYSICAL EXAMINATION:    VITALS:   Vitals:   09/26/22 1128  BP: 115/72  Pulse: 72  SpO2: 97%  Weight: 198 lb (89.8 kg)  Height: 6' (1.829 m)      Wt Readings from Last 3 Encounters:  09/26/22 198 lb (89.8 kg)  09/03/22 196 lb 6.4 oz (89.1 kg)  08/21/22 191 lb 12.8 oz (87 kg)    GEN:  The patient appears stated age and is in NAD. HEENT:  Normocephalic, atraumatic.  The mucous membranes are moist. The superficial temporal arteries are without ropiness or tenderness.    Neurological examination:  Orientation: The patient is alert and oriented x3. Cranial nerves: There is  good facial symmetry with min facial hypomimia. The speech is fluent and clear. Soft palate rises symmetrically and there is no tongue deviation. Hearing is intact to conversational tone. Sensation: Sensation is intact to light touch throughout Motor: Strength is at least antigravity x4.  Movement examination: Tone: There is nl tone in the UE/LE. Abnormal movements: mild dyskinesia of the R leg Coordination:  There is minimal slowness with finger taps on the left.  Otherwise, rapid alternating movements are good. Gait and Station: The patient pushes off of the chair to arise.  He ambulates fairly well with his cane, but he is a bit unsteady.  I have reviewed and interpreted the following labs independently    Chemistry      Component Value Date/Time   NA 138 08/21/2022 1558   NA 140 08/21/2022 1444   K 4.2 08/21/2022 1558   CL 102 08/21/2022 1558   CO2 29 08/21/2022 1558   BUN 32 (H) 08/21/2022 1558   BUN 31 (H) 08/21/2022 1444   CREATININE 1.29 (H) 08/21/2022 1558   CREATININE 1.27 08/21/2022 1444      Component Value Date/Time   CALCIUM 9.4 08/21/2022 1558   ALKPHOS 89 04/29/2022 1640   AST 4 (L) 08/21/2022 1558   ALT 4 (L) 08/21/2022 1558   BILITOT 0.4 08/21/2022 1558       Lab  Results  Component Value Date   WBC 5.5 08/21/2022   HGB 14.6 08/21/2022   HCT 43.1 08/21/2022   MCV 89.8 08/21/2022   PLT 246 08/21/2022    No results found for: "TSH"   Total time spent on today's visit was *** minutes, including both face-to-face time and nonface-to-face time.  Time included that spent on review of records (prior notes available to me/labs/imaging if pertinent), discussing treatment and goals, answering patient's questions and coordinating care.  Cc:  Emilio Aspen, MD

## 2023-04-02 NOTE — Progress Notes (Addendum)
PROGRESS NOTE    [name]  LKG:[id number] DOB: Feb 06, 1946 DOA: 04/01/2023 PCP: Emilio Aspen, MD   Brief Narrative: 77 year old male with a history of multidrug-resistant urinary tract infection, recurrent UTI, hypertension, Parkinson's disease, hyperlipidemia, type 2 diabetes, osteoporosis admitted with altered mental status and hypoglycemia.  He was in his usual state of health until he received a steroid shot for bursitis of his hip.  When family checked his blood sugar it was 575 so he was brought to the ER for further assessment.  In the ER he was febrile at 101, white count was 10.7 lactic acid 1.7 hemoglobin 15.6, UA possibly infection, blood cultures were sent.  CT of the head showed no acute abnormal findings.  Chest x-ray showed no acute findings.     Assessment & Plan:   Principal Problem:   Sepsis (HCC) Active Problems:   Sepsis due to urinary tract infection (HCC)   #1 acute toxic metabolic encephalopathy secondary to urinary tract infection in the setting of recent steroid shot and hyperglycemia with uncontrolled diabetes.  Patient has had multiple admissions for recurrent UTIs.  I had seen him a year ago in the hospital for the same thing.  He was followed by infectious disease closely as an outpatient they had not seen him since March 2023.  He was placed on methenamine hippurate and was doing okay till he got the steroid shot for the bursitis of the hip. With multiple drug resistance he is placed on meropenem  follow-up cultures.will touch base with ID once you have the culture back.  #2 sepsis secondary to UTI at the time of admission he was febrile at 101 with leukocytosis and positive UA and elevated creatinine. Continue meropenem Follow-up cultures Restart methenamine on discharge  #3  Elevated creatinine-he received some fluids.  But then he started coughing family was concerned with history of heart failure.  Fluids were stopped.  Chest x-ray on admission  was clear.  BNP added to the labs from today.   #4 uncontrolled type 2 diabetes with an A1c over 11.  Appreciate diabetic coordinator input.  Family would like to continue the glucose monitor.  Discussed with family what the co-pay would be for both as per pharmacy. Continue Levemir insulin and NovoLog. CBG (last 3)  Recent Labs    04/02/23 0408 04/02/23 0749 04/02/23 1114  GLUCAP 109* 113* 184*    #5 Parkinson's disease continue Sinemet  #6 CAD-continue aspirin and Plavix.  Echo this admission with EF of 50 to 55%. Patient follows up with Dr. Anne Fu.  He was supposed to have an echo done on the 27th of this month.  #7 hyperlipidemia on Crestor  #8 BPH on Flomax  #9 elevated troponin 42 and 50 likely from demand ischemia due to sepsis but since it was trending up I will trend troponin twice.  EKG with no acute ST-T wave changes. He follows up with Dr. Anne Fu.  Echocardiogram ejection fraction 50 to 55%.  Low normal function.  No regional wall motion abnormalities.  Grade 1 diastolic dysfunction.   Estimated body mass index is 26.91 kg/m as calculated from the following:   Height as of 01/23/23: 6' (1.829 m).   Weight as of 01/23/23: 90 kg.  DVT prophylaxis: Lovenox  code Status: Full code  family Communication: Discussed with wife and daughter Disposition Plan:  Status is: Inpatient Remains inpatient appropriate because: Sepsis UTI   Consultants:  None  Procedures: None  antimicrobials: Anti-infectives (From admission, onward)  Start     Dose/Rate Route Frequency Ordered Stop   04/02/23 0800  methenamine (HIPREX) tablet 1 g  Status:  Discontinued        1 g Oral 2 times daily with meals 04/01/23 2122 04/01/23 2149   04/02/23 0800  methenamine (MANDELAMINE) tablet 1,000 mg        1,000 mg Oral 2 times daily with meals 04/01/23 2149     04/02/23 0400  meropenem (MERREM) 1 g in sodium chloride 0.9 % 100 mL IVPB        1 g 200 mL/hr over 30 Minutes Intravenous Every 8  hours 04/01/23 2117     04/01/23 1900  meropenem (MERREM) 1 g in sodium chloride 0.9 % 100 mL IVPB        1 g 200 mL/hr over 30 Minutes Intravenous  Once 04/01/23 1848 04/01/23 2019        Subjective: Patient is resting in bed He states he feels like a brick Family at bedside  Objective: Vitals:   04/01/23 2257 04/02/23 0415 04/02/23 0951 04/02/23 1349  BP: (!) 119/52 138/70 110/61 105/60  Pulse: 71 82 79 (!) 55  Resp: 18 18 17  (!) 21  Temp: (!) 101.9 F (38.8 C) (!) 102.2 F (39 C) 98 F (36.7 C) 98.7 F (37.1 C)  TempSrc: Oral Oral Oral Oral  SpO2: 97% 96% 93% 94%    Intake/Output Summary (Last 24 hours) at 04/02/2023 1428 Last data filed at 04/02/2023 0900 Gross per 24 hour  Intake 1420 ml  Output --  Net 1420 ml   There were no vitals filed for this visit.  Examination:  General exam: Appears in no acute distress Respiratory system: Clear to auscultation. Respiratory effort normal. Cardiovascular system: S1 & S2 heard, RRR. No JVD, murmurs, rubs, gallops or clicks. No pedal edema. Gastrointestinal system: Abdomen is nondistended, soft and nontender. No organomegaly or masses felt. Normal bowel sounds heard. Central nervous system: Alert and oriented. Extremities: No edema   Data Reviewed: I have personally reviewed following labs and imaging studies  CBC: Recent Labs  Lab 04/01/23 1607 04/02/23 0450  WBC 10.7* 9.2  NEUTROABS 9.6*  --   HGB 15.6 13.3  HCT 46.5 39.9  MCV 91.9 91.3  PLT 213 207   Basic Metabolic Panel: Recent Labs  Lab 04/01/23 1607 04/02/23 0450  NA 134* 136  K 4.1 3.5  CL 96* 103  CO2 27 27  GLUCOSE 369* 134*  BUN 25* 20  CREATININE 1.37* 1.27*  CALCIUM 8.9 8.2*   GFR: CrCl cannot be calculated (Unknown ideal weight.). Liver Function Tests: Recent Labs  Lab 04/01/23 1607  AST 7*  ALT 7  ALKPHOS 105  BILITOT 0.9  PROT 6.9  ALBUMIN 3.6   No results for input(s): "LIPASE", "AMYLASE" in the last 168 hours. No  results for input(s): "AMMONIA" in the last 168 hours. Coagulation Profile: No results for input(s): "INR", "PROTIME" in the last 168 hours. Cardiac Enzymes: No results for input(s): "CKTOTAL", "CKMB", "CKMBINDEX", "TROPONINI" in the last 168 hours. BNP (last 3 results) No results for input(s): "PROBNP" in the last 8760 hours. HbA1C: Recent Labs    04/02/23 0450  HGBA1C 11.7*   CBG: Recent Labs  Lab 04/01/23 2123 04/02/23 0035 04/02/23 0408 04/02/23 0749 04/02/23 1114  GLUCAP 224* 138* 109* 113* 184*   Lipid Profile: No results for input(s): "CHOL", "HDL", "LDLCALC", "TRIG", "CHOLHDL", "LDLDIRECT" in the last 72 hours. Thyroid Function Tests: No results for input(s): "  TSH", "T4TOTAL", "FREET4", "T3FREE", "THYROIDAB" in the last 72 hours. Anemia Panel: No results for input(s): "VITAMINB12", "FOLATE", "FERRITIN", "TIBC", "IRON", "RETICCTPCT" in the last 72 hours. Sepsis Labs: Recent Labs  Lab 04/01/23 1600 04/01/23 1818  LATICACIDVEN 1.7 1.5    Recent Results (from the past 240 hour(s))  Blood Culture (routine x 2)     Status: None (Preliminary result)   Collection Time: 04/01/23  3:59 PM   Specimen: BLOOD  Result Value Ref Range Status   Specimen Description   Final    BLOOD LEFT ANTECUBITAL Performed at [facility], [address], [place], [place] [address]    Special Requests   Final    BOTTLES DRAWN AEROBIC AND ANAEROBIC Blood Culture results may not be optimal due to an inadequate volume of blood received in culture bottles Performed at [facility], [address], [place], [place] [address]    Culture   Final    NO GROWTH < 12 HOURS Performed at [facility], [address] [place], [place] [address]    Report Status PENDING  Incomplete  Blood Culture (routine x 2)     Status: None (Preliminary result)   Collection Time: 04/01/23  4:05 PM   Specimen: BLOOD RIGHT FOREARM  Result Value Ref Range Status    Specimen Description   Final    BLOOD RIGHT FOREARM Performed at [facility], [address], [place], [place] [address]    Special Requests   Final    BOTTLES DRAWN AEROBIC AND ANAEROBIC Blood Culture adequate volume Performed at [facility], [address], [place], [place] [address]    Culture   Final    NO GROWTH < 12 HOURS Performed at [facility], [address] [place], [place] [address]    Report Status PENDING  Incomplete  Resp panel by RT-PCR (RSV, Flu A&B, Covid) Anterior Nasal Swab     Status: None   Collection Time: 04/01/23  4:10 PM   Specimen: Anterior Nasal Swab  Result Value Ref Range Status   SARS Coronavirus 2 by RT PCR NEGATIVE NEGATIVE Final    Comment: (NOTE) SARS-CoV-2 target nucleic acids are NOT DETECTED.  The SARS-CoV-2 RNA is generally detectable in upper respiratory specimens during the acute phase of infection. The lowest concentration of SARS-CoV-2 viral copies this assay can detect is 138 copies/mL. A negative result does not preclude SARS-Cov-2 infection and should not be used as the sole basis for treatment or other patient management decisions. A negative result may occur with  improper specimen collection/handling, submission of specimen other than nasopharyngeal swab, presence of viral mutation(s) within the areas targeted by this assay, and inadequate number of viral copies(<138 copies/mL). A negative result must be combined with clinical observations, patient history, and epidemiological information. The expected result is Negative.  Fact Sheet for Patients:  BloggerCourse.com  Fact Sheet for Healthcare Providers:  SeriousBroker.it  This test is no t yet approved or cleared by the [place] FDA and  has been authorized for detection and/or diagnosis of SARS-CoV-2 by FDA under an Emergency Use Authorization (EUA). This EUA will remain  in  effect (meaning this test can be used) for the duration of the COVID-19 declaration under Section 564(b)(1) of the Act, 21 U.S.C.section 360bbb-3(b)(1), unless the authorization is terminated  or revoked sooner.       Influenza A by PCR NEGATIVE NEGATIVE Final   Influenza B by PCR NEGATIVE  NEGATIVE Final    Comment: (NOTE) The Xpert Xpress SARS-CoV-2/FLU/RSV plus assay is intended as an aid in the diagnosis of influenza from Nasopharyngeal swab specimens and should not be used as a sole basis for treatment. Nasal washings and aspirates are unacceptable for Xpert Xpress SARS-CoV-2/FLU/RSV testing.  Fact Sheet for Patients: BloggerCourse.com  Fact Sheet for Healthcare Providers: SeriousBroker.it  This test is not yet approved or cleared by the [place] FDA and has been authorized for detection and/or diagnosis of SARS-CoV-2 by FDA under an Emergency Use Authorization (EUA). This EUA will remain in effect (meaning this test can be used) for the duration of the COVID-19 declaration under Section 564(b)(1) of the Act, 21 U.S.C. section 360bbb-3(b)(1), unless the authorization is terminated or revoked.     Resp Syncytial Virus by PCR NEGATIVE NEGATIVE Final    Comment: (NOTE) Fact Sheet for Patients: BloggerCourse.com  Fact Sheet for Healthcare Providers: SeriousBroker.it  This test is not yet approved or cleared by the [place] FDA and has been authorized for detection and/or diagnosis of SARS-CoV-2 by FDA under an Emergency Use Authorization (EUA). This EUA will remain in effect (meaning this test can be used) for the duration of the COVID-19 declaration under Section 564(b)(1) of the Act, 21 U.S.C. section 360bbb-3(b)(1), unless the authorization is terminated or revoked.  Performed at [facility], [address] [place], [place] [address]           Radiology Studies: ECHOCARDIOGRAM COMPLETE  Result Date: 04/02/2023    ECHOCARDIOGRAM REPORT   Patient Name:   [name] Date of Exam: 04/02/2023 Medical Rec #:  [id number]   Height:       72.0 in Accession #:    [id number]  Weight:       198.4 lb Date of Birth:  [birth year]   BSA:          2.123 m Patient Age:    76 years    BP:           110/61 mmHg Patient Gender: M           HR:           68 bpm. Exam Location:  Inpatient Procedure: 2D Echo, Cardiac Doppler and Color Doppler Indications:    Congestive Heart Failure  History:        Patient has prior history of Echocardiogram examinations, most                 recent 01/02/2023. CAD, sepsis, Parkinson's, CKD,                 Signs/Symptoms:Syncope, Edema and Fatigue; Risk                 Factors:Dyslipidemia, Diabetes and Hypertension.  Sonographer:    Wallie Char Referring Phys: [id number] ROBERT DORRELL  Sonographer Comments: Technically difficult study due to poor echo windows. Image acquisition challenging due to respiratory motion. IMPRESSIONS  1. Left ventricular ejection fraction, by estimation, is 50 to 55%. The left ventricle has low normal function. The left ventricle has no regional wall motion abnormalities. There is mild concentric left ventricular hypertrophy. Left ventricular diastolic parameters are consistent with Grade I diastolic dysfunction (impaired relaxation).  2. Right ventricular systolic function is normal. The right ventricular size is normal. There is mildly elevated pulmonary artery systolic pressure. The estimated right ventricular systolic pressure is 36.2 mmHg.  3. Left atrial size was moderately dilated.  4. Right atrial size was  mildly dilated.  5. The mitral valve is normal in structure. No evidence of mitral valve regurgitation.  6. The aortic valve is grossly normal. There is mild calcification of the aortic valve. Aortic valve regurgitation is not visualized. Aortic valve sclerosis/calcification is present,  without any evidence of aortic stenosis. Comparison(s): No significant change from prior study. Prior images reviewed side by side. FINDINGS  Left Ventricle: Left ventricular ejection fraction, by estimation, is 50 to 55%. The left ventricle has low normal function. The left ventricle has no regional wall motion abnormalities. The left ventricular internal cavity size was normal in size. There is mild concentric left ventricular hypertrophy. Left ventricular diastolic parameters are consistent with Grade I diastolic dysfunction (impaired relaxation). Normal left ventricular filling pressure. Right Ventricle: The right ventricular size is normal. No increase in right ventricular wall thickness. Right ventricular systolic function is normal. There is mildly elevated pulmonary artery systolic pressure. The tricuspid regurgitant velocity is 2.88  m/s, and with an assumed right atrial pressure of 3 mmHg, the estimated right ventricular systolic pressure is 36.2 mmHg. Left Atrium: Left atrial size was moderately dilated. Right Atrium: Right atrial size was mildly dilated. Pericardium: There is no evidence of pericardial effusion. Mitral Valve: The mitral valve is normal in structure. No evidence of mitral valve regurgitation. MV peak gradient, 3.5 mmHg. The mean mitral valve gradient is 1.5 mmHg. Tricuspid Valve: The tricuspid valve is normal in structure. Tricuspid valve regurgitation is trivial. Aortic Valve: The aortic valve is grossly normal. There is mild calcification of the aortic valve. Aortic valve regurgitation is not visualized. Aortic valve sclerosis/calcification is present, without any evidence of aortic stenosis. Aortic valve mean gradient measures 7.3 mmHg. Aortic valve peak gradient measures 11.7 mmHg. Aortic valve area, by VTI measures 2.94 cm. Pulmonic Valve: The pulmonic valve was not well visualized. Pulmonic valve regurgitation is not visualized. Aorta: The aortic root is normal in size and  structure. IAS/Shunts: The interatrial septum was not well visualized.  LEFT VENTRICLE PLAX 2D LVIDd:         4.30 cm      Diastology LVIDs:         3.30 cm      LV e' medial:    6.26 cm/s LV PW:         1.30 cm      LV E/e' medial:  10.6 LV IVS:        1.30 cm      LV e' lateral:   11.75 cm/s LVOT diam:     2.30 cm      LV E/e' lateral: 5.7 LV SV:         113 LV SV Index:   53 LVOT Area:     4.15 cm  LV Volumes (MOD) LV vol d, MOD A2C: 132.0 ml LV vol d, MOD A4C: 130.0 ml LV vol s, MOD A2C: 66.2 ml LV vol s, MOD A4C: 67.5 ml LV SV MOD A2C:     65.8 ml LV SV MOD A4C:     130.0 ml LV SV MOD BP:      69.0 ml RIGHT VENTRICLE             IVC RV S prime:     11.50 cm/s  IVC diam: 1.90 cm TAPSE (M-mode): 2.0 cm LEFT ATRIUM             Index        RIGHT ATRIUM  Index LA diam:        4.90 cm 2.31 cm/m   RA Area:     22.90 cm LA Vol (A2C):   60.1 ml 28.30 ml/m  RA Volume:   77.10 ml  36.31 ml/m LA Vol (A4C):   54.8 ml 25.81 ml/m LA Biplane Vol: 57.5 ml 27.08 ml/m  AORTIC VALVE AV Area (Vmax):    3.23 cm AV Area (Vmean):   3.16 cm AV Area (VTI):     2.94 cm AV Vmax:           171.00 cm/s AV Vmean:          127.667 cm/s AV VTI:            0.385 m AV Peak Grad:      11.7 mmHg AV Mean Grad:      7.3 mmHg LVOT Vmax:         133.00 cm/s LVOT Vmean:        97.233 cm/s LVOT VTI:          0.272 m LVOT/AV VTI ratio: 0.71  AORTA Ao Root diam: 3.50 cm Ao Asc diam:  3.50 cm MITRAL VALVE               TRICUSPID VALVE MV Area (PHT): 3.31 cm    TR Peak grad:   33.2 mmHg MV Area VTI:   4.18 cm    TR Vmax:        288.00 cm/s MV Peak grad:  3.5 mmHg MV Mean grad:  1.5 mmHg    SHUNTS MV Vmax:       0.93 m/s    Systemic VTI:  0.27 m MV Vmean:      62.7 cm/s   Systemic Diam: 2.30 cm MV Decel Time: 229 msec MV E velocity: 66.65 cm/s MV A velocity: 91.90 cm/s MV E/A ratio:  0.73 Mihai Croitoru MD Electronically signed by Thurmon Fair MD Signature Date/Time: 04/02/2023/12:22:14 PM    Final    CT Head Wo Contrast  Result  Date: 04/01/2023 CLINICAL DATA:  ams EXAM: CT HEAD WITHOUT CONTRAST TECHNIQUE: Contiguous axial images were obtained from the base of the skull through the vertex without intravenous contrast. RADIATION DOSE REDUCTION: This exam was performed according to the departmental dose-optimization program which includes automated exposure control, adjustment of the mA and/or kV according to patient size and/or use of iterative reconstruction technique. COMPARISON:  CT head Mar 01, 2022. FINDINGS: Brain: No evidence of acute infarction, hemorrhage, hydrocephalus, extra-axial collection or mass lesion/mass effect. Cerebral atrophy. Vascular: No hyperdense vessel identified. Skull: No acute fracture. Sinuses/Orbits: Clear sinuses.  No acute orbital findings. Other: No mastoid effusions. IMPRESSION: No evidence of acute intracranial abnormality. Electronically Signed   By: Feliberto Harts M.D.   On: 04/01/2023 17:38   DG Chest Port 1 View  Result Date: 04/01/2023 CLINICAL DATA:  Altered mental status EXAM: PORTABLE CHEST 1 VIEW COMPARISON:  Chest radiograph dated 07/26/2022 FINDINGS: Normal lung volumes. No focal consolidations. No pleural effusion or pneumothorax. Enlarged cardiomediastinal silhouette is likely projectional. No acute osseous abnormality. IMPRESSION: No active disease. Electronically Signed   By: Agustin Cree M.D.   On: 04/01/2023 17:07        Scheduled Meds:  aspirin EC  81 mg Oral Daily   carbidopa-levodopa  3 tablet Oral 6 times per day   clopidogrel  75 mg Oral Q breakfast   enoxaparin (LOVENOX) injection  40 mg Subcutaneous Q24H   entacapone  200  mg Oral TID   insulin aspart  0-24 Units Subcutaneous Q4H   insulin detemir  20 Units Subcutaneous Daily   methenamine  1,000 mg Oral BID WC   pantoprazole  40 mg Oral Daily   rOPINIRole  0.5 mg Oral QHS   rosuvastatin  40 mg Oral Daily   tamsulosin  0.4 mg Oral Daily   Continuous Infusions:  sodium chloride 75 mL/hr at 04/02/23 1030    meropenem (MERREM) IV 1 g (04/02/23 0409)     LOS: 1 day   Time spent: 38 min  Alwyn Ren, MD  04/02/2023, 2:28 PM

## 2023-04-02 NOTE — Inpatient Diabetes Management (Addendum)
Inpatient Diabetes Program Recommendations  AACE/ADA: New Consensus Statement on Inpatient Glycemic Control (2015)  Target Ranges:  Prepandial:   less than 140 mg/dL      Peak postprandial:   less than 180 mg/dL (1-2 hours)      Critically ill patients:  140 - 180 mg/dL   Lab Results  Component Value Date   GLUCAP 184 (H) 04/02/2023   HGBA1C 11.7 (H) 04/02/2023    Review of Glycemic Control  Diabetes history: DM2 Outpatient Diabetes medications: Lantus 23 units QHS Current orders for Inpatient glycemic control: Levemir 20 units every day, Novolog 0-24 units Q4H  Inpatient Diabetes Program Recommendations:    Consider rapid insulin at discharge given A1C of 11.7% (average BG of 289 mg/dL).  Spoke with spouse at bedside.  She confirms above home dose of Lantus every day.  She administers his insulin and does not skip doses.  He rarely allows her to check his glucose because he does not like being stuck.  She is asking about a CGM.  Asked Pharmacy for a benefit check on CGMs and rapid insulin.  Patient's daughter is a Tax adviser and is very familiar with CGMs.   Reviewed patient's current A1c.  Explained what a A1c is and what it measures. Also reviewed goal A1c with patient, importance of good glucose control @ home, and blood sugar goals.  Spouse says he asks for sweets a lot because that's what he can taste best.  Recommended establishing with an endocrinologist.    Will continue to follow while inpatient.  Thank you, Dulce Sellar, MSN, CDCES Diabetes Coordinator Inpatient Diabetes Program [phone] (team pager from 8a-5p)

## 2023-04-02 NOTE — Evaluation (Signed)
Occupational Therapy Evaluation Patient Details [name] MRN: [id number] DOB: May 12, 1946 Today's Date: 04/02/2023   History of Present Illness Patient is a 77 year old male who presented on 6/17 with increased confusion and elevated glucose levels. Patient was admitted with complicated UTI.  PMH: UTI, parkinsons,T12 spinous process fx, L1 ant-middle colum fx and sacral fx.   Clinical Impression   Patient is a 77 year old male who was admitted for above. Patient was living at home with wife and independence in ADLs. Currently, patient is noted to required increased physical A and cues to attend to tasks and prevent falling with RW. Patient noted to drift to the R with mobility, strong pushing response on RW, and restlessness in legs sitting in recliner. Patient was noted to have decreased functional activity tolerance, decreased endurance, decreased standing balance, decreased safety awareness, and decreased knowledge of AD/AE impacting participation in ADLs. Patient would continue to benefit from skilled OT services at this time while admitted and after d/c to address noted deficits in order to improve overall safety and independence in ADLs.        Recommendations for follow up therapy are one component of a multi-disciplinary discharge planning process, led by the attending physician.  Recommendations may be updated based on patient status, additional functional criteria and insurance authorization.   Assistance Recommended at Discharge Frequent or constant Supervision/Assistance  Patient can return home with the following A lot of help with walking and/or transfers;A lot of help with bathing/dressing/bathroom;Assistance with cooking/housework;Direct supervision/assist for financial management;Assist for transportation;Help with stairs or ramp for entrance;Direct supervision/assist for medications management    Functional Status Assessment  Patient has had a recent decline in their  functional status and demonstrates the ability to make significant improvements in function in a reasonable and predictable amount of time.  Equipment Recommendations  None recommended by OT       Precautions / Restrictions Precautions Precautions: Fall Restrictions Weight Bearing Restrictions: No      Mobility Bed Mobility Overal bed mobility: Needs Assistance Bed Mobility: Supine to Sit     Supine to sit: Min guard     General bed mobility comments: cues, increased time, HOB elevated    Transfers Overall transfer level: Needs assistance Equipment used: Rolling walker (2 wheels) Transfers: Sit to/from Stand Sit to Stand: Min guard           General transfer comment: cues for proper UE and AD placement      Balance Overall balance assessment: History of Falls, Needs assistance Sitting-balance support: Feet unsupported, No upper extremity supported       Standing balance support: Bilateral upper extremity supported, During functional activity, Reliant on assistive device for balance Standing balance-Leahy Scale: Poor           ADL either performed or assessed with clinical judgement   ADL Overall ADL's : Needs assistance/impaired Eating/Feeding: Modified independent;Sitting   Grooming: Sitting;Min guard   Upper Body Bathing: Sitting;Minimal assistance   Lower Body Bathing: Sitting/lateral leans;Sit to/from stand;Minimal assistance Lower Body Bathing Details (indicate cue type and reason): has previous back fractures, able to complete figure four positioning in recliner, non compliant with back preacutions in past. Upper Body Dressing : Min guard;Sitting   Lower Body Dressing: Maximal assistance;Sit to/from stand Lower Body Dressing Details (indicate cue type and reason): has previous back fractures, able to complete figure four positioning in recliner, non compliant with back preacutions in past. Toilet Transfer: Minimal assistance;+2 for physical  assistance;+2  for safety/equipment;Moderate assistance Toilet Transfer Details (indicate cue type and reason): patient noted to drift to the R side when walking in hallway with patient pushing RW further from patient with slow shuffle steps. patient needed constant cues to attend to task with patient easily distracted during session. mod A to complete turns. Toileting- Clothing Manipulation and Hygiene: Maximal assistance;Sit to/from stand               Vision Baseline Vision/History: 1 Wears glasses              Pertinent Vitals/Pain Pain Assessment Pain Assessment: No/denies pain     Hand Dominance Right   Extremity/Trunk Assessment Upper Extremity Assessment Upper Extremity Assessment: Overall WFL for tasks assessed   Lower Extremity Assessment Lower Extremity Assessment: Defer to PT evaluation (edema in LLE >RLE)   Cervical / Trunk Assessment Cervical / Trunk Assessment: Back Surgery   Communication Communication Communication: No difficulties   Cognition Arousal/Alertness: Awake/alert Behavior During Therapy: Flat affect Overall Cognitive Status: Difficult to assess         General Comments: patient was easily distracted during session with wife, daughter and Diabetic nurse in room chatting about unrelated issues. unable to complete further congnitive assessments at this time.     General Comments  noted rubor R medial foot            Home Living Family/patient expects to be discharged to:: Private residence Living Arrangements: Spouse/significant other Available Help at Discharge: Available PRN/intermittently;Family Type of Home: House Home Access: Stairs to enter [employer] of Steps: 5 steps   Home Layout: Able to live on main level with bedroom/bathroom;Laundry or work area in basement     [employer] Shower/Tub: [employer] Equipment: Gilmer Mor - single Multimedia programmer - built in;Shower seat;Rolling Environmental consultant (2  wheels)   Additional Comments: 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 still bends too much with reacher for pants and compression stockings with wife assist for these things since fall.        OT Problem List: Decreased range of motion;Decreased cognition;Decreased activity tolerance;Decreased safety awareness;Impaired balance (sitting and/or standing);Decreased knowledge of use of DME or AE;Decreased knowledge of precautions;Impaired UE functional use;Decreased coordination      OT Treatment/Interventions: Self-care/ADL training;Therapeutic exercise;Therapeutic activities;Neuromuscular education;Energy conservation;DME and/or AE instruction;Patient/family education;Balance training    OT Goals(Current goals can be found in the care plan section) Acute Rehab OT Goals Patient Stated Goal: to go home OT Goal Formulation: With patient/family Time For Goal Achievement: 04/16/23 Potential to Achieve Goals: Fair  OT Frequency: Min 1X/week    Co-evaluation PT/OT/SLP Co-Evaluation/Treatment: Yes Reason for Co-Treatment: Necessary to address cognition/behavior during functional activity;To address functional/ADL transfers PT goals addressed during session: Mobility/safety with mobility OT goals addressed during session: ADL's and self-care      AM-PAC OT "6 Clicks" Daily Activity     Outcome Measure Help from another person eating meals?: A Little Help from another person taking care of personal grooming?: A Little Help from another person toileting, which includes using toliet, bedpan, or urinal?: A Lot Help from another person bathing (including washing, rinsing, drying)?: A Lot Help from another person to put on and taking off regular upper body clothing?: A  Little Help from another person  to put on and taking off regular lower body clothing?: A Lot 6 Click Score: 15   End of Session Equipment Utilized During Treatment: Gait belt;Rolling walker (2 wheels) Nurse Communication: Other (comment) (concerns over patients changes since last admission)  Activity Tolerance: Patient tolerated treatment well Patient left: in chair;with call bell/phone within reach;with chair alarm set;with family/visitor present  OT Visit Diagnosis: Unsteadiness on feet (R26.81);Other abnormalities of gait and mobility (R26.89);Muscle weakness (generalized) (M62.81);History of falling (Z91.81)                Time: 4098-1191 OT Time Calculation (min): 22 min Charges:  OT General Charges $OT Visit: 1 Visit OT Evaluation $OT Eval Moderate Complexity: 1 Mod  Elleen Coulibaly OTR/L, MS Acute Rehabilitation Department Office# [phone]   Selinda Flavin 04/02/2023, 3:03 PM

## 2023-04-02 NOTE — Evaluation (Addendum)
Physical Therapy Evaluation Patient Details [name] MRN: [id number] DOB: Feb 03, 1946 Today's Date: 04/02/2023  History of Present Illness  77 yo male presented to ED on 04/01/2023 due to AMS and hyperglycemia with family reporting blood glucose levels 575 in home setting. On arrival to ED pt was febrile, abn labs, UA positive, head CT negative for any acute findings and CXR negative. Pt was found to have sepsis secondary to UTI. Pt PMH includes but is not limited to: DM II, BPH, HDL, HTN, PD, TKA, recurrent UTI, hernia repair, spinal fx and falls.   Clinical Impression    Pt admitted with above diagnosis.  Pt currently with functional limitations due to the deficits listed below (see PT Problem List). Pt in bed when therapist arrived. Pt agreeable to therapy intervention. Spouse and daughter present. Pt required increased time use of hospital bed and min guard for supine to sit, cues for attention and increased time t/o evaluation. Pt required min guard and cues for STS from EOB and for safe SPT to recliner. Pt required min A for gait tasks with PT managing RW, providing strong multimodal cues for attention to task, proper body position from walker, noted R veer and deficits with obstacle recognition and avoidance, mod A for safety with turns and pt demonstrating difficulty maintaining body position inside RW. Pt returned to room and seated in recliner, all needs in place, family and diabetes coordinator present.   Pt will benefit from acute skilled PT to increase their independence and safety with mobility to allow discharge.        Recommendations for follow up therapy are one component of a multi-disciplinary discharge planning process, led by the attending physician.  Recommendations may be updated based on patient status, additional functional criteria and insurance authorization.  Follow Up Recommendations       Assistance Recommended at Discharge Frequent or constant  Supervision/Assistance  Patient can return home with the following  A lot of help with walking and/or transfers;A lot of help with bathing/dressing/bathroom;Assistance with cooking/housework;Assist for transportation;Help with stairs or ramp for entrance    Equipment Recommendations None recommended by PT (pt and family report DME in home setting)  Recommendations for Other Services       Functional Status Assessment Patient has had a recent decline in their functional status and demonstrates the ability to make significant improvements in function in a reasonable and predictable amount of time.     Precautions / Restrictions Precautions Precautions: Fall Restrictions Weight Bearing Restrictions: No      Mobility  Bed Mobility Overal bed mobility: Needs Assistance Bed Mobility: Supine to Sit     Supine to sit: Min guard     General bed mobility comments: cues, increased time, HOB elevated    Transfers Overall transfer level: Needs assistance Equipment used: Rolling walker (2 wheels) Transfers: Sit to/from Stand Sit to Stand: Min guard           General transfer comment: cues for proper UE and AD placement    Ambulation/Gait Ambulation/Gait assistance: Min assist, Mod assist Gait Distance (Feet): 50 Feet Assistive device: Rolling walker (2 wheels) Gait Pattern/deviations: Step-to pattern, Shuffle, Staggering right, Wide base of support Gait velocity: decreased     General Gait Details: B toe out, absent heel strike, strong R veer, cues for attention and assist for obstcle recognition and attention, min A to manage RW with pt demonstrating poor body position, deficits with turns requiring mod A for RW management and min  A for balance with pt unable to maintain LEs inside RW  Stairs            Wheelchair Mobility    Modified Rankin (Stroke Patients Only)       Balance Overall balance assessment: History of Falls, Needs assistance Sitting-balance  support: Feet unsupported, No upper extremity supported (R lateral and posterior lean with LOB and pt unable to recover without cues and min A, static sitting with B LEs on floor min guard and F sitting balance)       Standing balance support: Bilateral upper extremity supported, During functional activity, Reliant on assistive device for balance Standing balance-Leahy Scale: Poor                               Pertinent Vitals/Pain Pain Assessment Pain Assessment: No/denies pain    Home Living Family/patient expects to be discharged to:: Private residence Living Arrangements: Spouse/significant other Available Help at Discharge: Available PRN/intermittently;Family Type of Home: House Home Access: Stairs to enter   [employer] of Steps: 5 steps   Home Layout: Able to live on main level with bedroom/bathroom;Laundry or work area in [employer] Equipment: Gilmer Mor - single point;BSC/3in1;Shower seat - built in;Shower seat;Rolling Environmental consultant (2 wheels) Additional Comments: 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 still bends too much with reacher for pants and compression stockings with wife assist for these things since fall.     Hand Dominance   Dominant Hand: Right    Extremity/Trunk Assessment   Upper Extremity Assessment Upper Extremity Assessment: Overall WFL for tasks assessed    Lower Extremity Assessment Lower Extremity Assessment: Defer to PT evaluation (edema in LLE >RLE)    Cervical / Trunk Assessment Cervical / Trunk Assessment: Back Surgery  Communication   Communication: No difficulties  Cognition Arousal/Alertness: Awake/alert Behavior During Therapy: WFL for tasks assessed/performed Overall Cognitive Status: Impaired/Different from baseline Area of Impairment:  Attention, Safety/judgement, Following commands, Awareness                       Following Commands: Follows one step commands consistently (with increased time) Safety/Judgement: Decreased awareness of safety, Decreased awareness of deficits              General Comments General comments (skin integrity, edema, etc.): noted rubor R medial foot    Exercises     Assessment/Plan    PT Assessment Patient needs continued PT services  PT Problem List Decreased strength;Decreased range of motion;Decreased activity tolerance;Decreased balance;Decreased mobility;Decreased coordination;Decreased cognition;Decreased knowledge of use of DME;Decreased safety awareness       PT Treatment Interventions DME instruction;Gait training;Functional mobility training;Therapeutic activities;Therapeutic exercise;Balance training;Neuromuscular re-education;Patient/family education    PT Goals (Current goals can be found in the Care Plan section)  Acute Rehab PT Goals Patient Stated Goal: go home PT Goal Formulation: With patient Time For Goal Achievement: 04/16/23 Potential to Achieve Goals: Fair    Frequency Min 1X/week     Co-evaluation PT/OT/SLP Co-Evaluation/Treatment: Yes Reason for Co-Treatment: Necessary to address cognition/behavior during functional activity;To address functional/ADL transfers PT goals addressed during session: Mobility/safety with mobility OT goals addressed during session: ADL's and self-care       AM-PAC PT "6  Clicks" Mobility  Outcome Measure Help needed turning from your back to your side while in a flat bed without using bedrails?: A Little Help needed moving from lying on your back to sitting on the side of a flat bed without using bedrails?: A Little Help needed moving to and from a bed to a chair (including a wheelchair)?: A Little Help needed standing up from a chair using your arms (e.g., wheelchair or bedside chair)?: A Little Help needed to  walk in hospital room?: A Lot Help needed climbing 3-5 steps with a railing? : Total 6 Click Score: 15    End of Session Equipment Utilized During Treatment: Gait belt Activity Tolerance: Patient tolerated treatment well Patient left: in chair;with call bell/phone within reach;with chair alarm set;with family/visitor present;with nursing/sitter in room Nurse Communication: Mobility status PT Visit Diagnosis: Unsteadiness on feet (R26.81);Other abnormalities of gait and mobility (R26.89);Repeated falls (R29.6);Muscle weakness (generalized) (M62.81);Difficulty in walking, not elsewhere classified (R26.2)    Time: 4098-1191 PT Time Calculation (min) (ACUTE ONLY): 23 min   Charges:   PT Evaluation $PT Eval Low Complexity: 1 Low          Johnny Bridge, PT Acute Rehab   Jacqualyn Posey 04/02/2023, 1:32 PM

## 2023-04-03 ENCOUNTER — Other Ambulatory Visit (HOSPITAL_COMMUNITY): Payer: Self-pay

## 2023-04-03 ENCOUNTER — Inpatient Hospital Stay (HOSPITAL_COMMUNITY): Payer: Medicare Other

## 2023-04-03 ENCOUNTER — Telehealth: Payer: Self-pay | Admitting: Neurology

## 2023-04-03 DIAGNOSIS — Z794 Long term (current) use of insulin: Secondary | ICD-10-CM

## 2023-04-03 DIAGNOSIS — A419 Sepsis, unspecified organism: Secondary | ICD-10-CM

## 2023-04-03 DIAGNOSIS — G9341 Metabolic encephalopathy: Secondary | ICD-10-CM | POA: Diagnosis not present

## 2023-04-03 DIAGNOSIS — N179 Acute kidney failure, unspecified: Secondary | ICD-10-CM | POA: Diagnosis not present

## 2023-04-03 DIAGNOSIS — E1165 Type 2 diabetes mellitus with hyperglycemia: Secondary | ICD-10-CM | POA: Diagnosis not present

## 2023-04-03 DIAGNOSIS — R7989 Other specified abnormal findings of blood chemistry: Secondary | ICD-10-CM | POA: Insufficient documentation

## 2023-04-03 LAB — URINE CULTURE: Culture: >=100000 — AB

## 2023-04-03 LAB — GLUCOSE, CAPILLARY
Glucose-Capillary: 104 mg/dL — ABNORMAL HIGH (ref 70–99)
Glucose-Capillary: 126 mg/dL — ABNORMAL HIGH (ref 70–99)
Glucose-Capillary: 166 mg/dL — ABNORMAL HIGH (ref 70–99)
Glucose-Capillary: 185 mg/dL — ABNORMAL HIGH (ref 70–99)
Glucose-Capillary: 199 mg/dL — ABNORMAL HIGH (ref 70–99)
Glucose-Capillary: 301 mg/dL — ABNORMAL HIGH (ref 70–99)

## 2023-04-03 LAB — CULTURE, BLOOD (ROUTINE X 2): Special Requests: ADEQUATE

## 2023-04-03 MED ORDER — SODIUM CHLORIDE 0.9 % IV SOLN
2.0000 g | INTRAVENOUS | Status: DC
  Administered 2023-04-03: 2 g via INTRAVENOUS
  Filled 2023-04-03: qty 20

## 2023-04-03 NOTE — Assessment & Plan Note (Signed)
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

## 2023-04-03 NOTE — Assessment & Plan Note (Signed)
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

## 2023-04-03 NOTE — Progress Notes (Signed)
Occupational Therapy Treatment Patient Details [name] MRN: [id number] DOB: July 16, 1946 Today's Date: 04/03/2023   History of present illness Patient is a 77 year old male who presented on 6/17 with increased confusion and elevated glucose levels. Patient was admitted with complicated UTI.  PMH: UTI, parkinsons,T12 spinous process fx, L1 ant-middle colum fx and sacral fx.   OT comments  The pt was found seated in the bedside chair. He was pleasant and motivated to participate in therapy. His spouse and daughter were present during some of the session. The session emphasized general functional strengthening, progressive functional activity, and safety during activity. He performed sit to stand with a RW, requiring steadying assist. He was able to ambulate using the RW, though with intermittent unsteadiness; he required consistent cues to keep the walker closer to his body and to avoid environmental obstacles. He was further able to perform lower body dressing/sock management at chair without the need for assist. He is demonstrating gradual functional progress. Continue OT plan of care.    Recommendations for follow up therapy are one component of a multi-disciplinary discharge planning process, led by the attending physician.  Recommendations may be updated based on patient status, additional functional criteria and insurance authorization.    Assistance Recommended at Discharge Intermittent Supervision/Assistance  Patient can return home with the following  Assistance with cooking/housework;A little help with bathing/dressing/bathroom;Assist for transportation;Help with stairs or ramp for entrance;Direct supervision/assist for medications management   Equipment Recommendations  None recommended by OT       Precautions / Restrictions Precautions Precautions: Fall Restrictions Weight Bearing Restrictions: No Other Position/Activity Restrictions: contact precautions       Mobility  Bed Mobility      General bed mobility comments: pt was received seated in the bedside chair    Transfers Overall transfer level: Needs assistance Equipment used: Rolling walker (2 wheels) Transfers: Sit to/from Stand Sit to Stand: Min guard                     ADL either performed or assessed with clinical judgement   ADL Overall ADL's : Needs assistance/impaired Eating/Feeding: Independent;Sitting Eating/Feeding Details (indicate cue type and reason): based on clinical judgement Grooming: Set up;Sitting Grooming Details (indicate cue type and reason): simulated         Upper Body Dressing : Set up;Sitting Upper Body Dressing Details (indicate cue type and reason): based on clinical judgement   Lower Body Dressing Details (indicate cue type and reason): He flexed at the hips in order to doff then donn his socks seated in the chair; he did not require assist for sock management, though he did require slightly increased time and effort to complete Toilet Transfer: Min guard;Minimal assistance;Regular Toilet;Rolling walker (2 wheels);Ambulation                   Vision Baseline Vision/History: 1 Wears glasses            Cognition Arousal/Alertness: Awake/alert Behavior During Therapy: WFL for tasks assessed/performed   Area of Impairment: Safety/judgement, Awareness      Following Commands: Follows one step commands consistently                           Pertinent Vitals/ Pain       Pain Assessment Pain Assessment: No/denies pain         Frequency  Min 1X/week        Progress Toward  Goals  OT Goals(current goals can now be found in the care plan section)  Progress towards OT goals: Progressing toward goals  Acute Rehab OT Goals Patient Stated Goal: to return home and to his normal activities OT Goal Formulation: With patient/family Time For Goal Achievement: 04/16/23 Potential to Achieve Goals: Good  Plan Discharge plan  remains appropriate       AM-PAC OT "6 Clicks" Daily Activity     Outcome Measure   Help from another person eating meals?: None Help from another person taking care of personal grooming?: A Little Help from another person toileting, which includes using toliet, bedpan, or urinal?: A Little Help from another person bathing (including washing, rinsing, drying)?: A Lot Help from another person to put on and taking off regular upper body clothing?: A Little Help from another person to put on and taking off regular lower body clothing?: A Little 6 Click Score: 18    End of Session Equipment Utilized During Treatment: Gait belt;Rolling walker (2 wheels)  OT Visit Diagnosis: Unsteadiness on feet (R26.81);Other abnormalities of gait and mobility (R26.89);History of falling (Z91.81)   Activity Tolerance Patient tolerated treatment well   Patient Left in chair;with call bell/phone within reach;with family/visitor present   Nurse Communication Mobility status        Time: 1722-1736 OT Time Calculation (min): 14 min  Charges: OT General Charges $OT Visit: 1 Visit OT Treatments $Therapeutic Activity: 8-22 mins     Reuben Likes, OTR/L 04/03/2023, 5:45 PM

## 2023-04-03 NOTE — TOC Initial Note (Addendum)
Transition of Care [facility]) - Initial/Assessment Note    Patient Details  [name] MRN: [id number] Date of Birth: [birth year]  Transition of Care [facility]) CM/SW Contact:    Erin Sons, LCSW Phone Number: 04/03/2023, 10:16 AM  Clinical Narrative:                  CSW called pt spouse to discuss HH rec. Pt lives at home in [place]. Spouse is agreeable to CSW arranging HH. Pt has had HH with [facility] in the past and agreeable for [facility] to be arrange with them. Spouse reports they have walker at home and do not need any additional DME.   HH PT/OT arranged with [facility]. Spouse notified and details added to AVS.   Expected Discharge Plan: Home w Home Health Services Barriers to Discharge: Continued Medical Work up   Patient Goals and CMS Choice            Expected Discharge Plan and Services       Living arrangements for the past 2 months: Single Family Home                                      Prior Living Arrangements/Services Living arrangements for the past 2 months: Single Family Home Lives with:: Spouse Patient language and need for interpreter reviewed:: Yes        Need for Family Participation in Patient Care: Yes (Comment) Care giver support system in place?: Yes (comment)   Criminal Activity/Legal Involvement Pertinent to Current Situation/Hospitalization: No - Comment as needed  Activities of Daily Living Home Assistive Devices/Equipment: CBG Meter, Eyeglasses, Walker (specify type) ADL Screening (condition at time of admission) Patient's cognitive ability adequate to safely complete daily activities?: Yes Is the patient deaf or have difficulty hearing?: No Does the patient have difficulty seeing, even when wearing glasses/contacts?: No Does the patient have difficulty concentrating, remembering, or making decisions?: Yes Patient able to express need for assistance with ADLs?: Yes Does the patient have difficulty  dressing or bathing?: Yes Independently performs ADLs?: Yes (appropriate for developmental age) Does the patient have difficulty walking or climbing stairs?: Yes Weakness of Legs: Both Weakness of Arms/Hands: None  Permission Sought/Granted                  Emotional Assessment       Orientation: : Oriented to Self, Oriented to Place Alcohol / Substance Use: Not Applicable Psych Involvement: No (comment)  Admission diagnosis:  Disorientation [R41.0] Acute cystitis without hematuria [N30.00] Sepsis due to urinary tract infection (HCC) [A41.9, N39.0] Sepsis (HCC) [A41.9] Sepsis, due to unspecified organism, unspecified whether acute organ dysfunction present [facility]) [A41.9] Patient Active Problem List   Diagnosis Date Noted   Sepsis (HCC) 04/01/2023   Sepsis due to urinary tract infection (HCC) 04/01/2023   Coronary artery disease involving native coronary 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 angina pectoris 01/11/2023   Acute combined systolic and diastolic heart failure (HCC)    UTI due to extended-spectrum beta lactamase (ESBL) producing Escherichia coli 04/29/2022   Dysphagia 04/29/2022   AKI (acute kidney injury) (HCC)    Dehydration    Failure to thrive (child)    Bacteriuria, asymptomatic    Failure to thrive in adult 04/20/2022   Malaise, possible ESBL Klebsiella UTI 04/20/2022   Chronic retention of urine 04/20/2022   Closed lumbar vertebral fracture (HCC) 04/20/2022  UTI (urinary tract infection) 03/01/2022   Leukocytosis 01/01/2022   Hyponatremia 01/01/2022   CKD (chronic kidney disease), stage III (HCC) 01/01/2022   HTN (hypertension) 12/31/2021   History of ESBL Klebsiella pneumoniae infection 12/31/2021   Generalized weakness 12/31/2021   Chronic venous insufficiency 08/29/2021   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   Encounter for general adult medical examination with abnormal findings 02/18/2018   Enlarged prostate 02/18/2018   Hearing loss  02/18/2018   Other long term (current) drug therapy 02/18/2018   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) 02/18/2018   HLD (hyperlipidemia)    Nephrolithiasis    Parkinson disease (HCC)    Diabetes mellitus with coincident hypertension (HCC)    Syncope    Renal calculus    Diabetes (HCC)    Allergic rhinitis    BPH (benign prostatic hyperplasia)    Erectile dysfunction    Dysphagia, pharyngeal phase    Edema    Ureteral calculus 02/09/2013   LEG PAIN 03/14/2010   PCP:  Emilio Aspen, MD Pharmacy:   [facility] - [place], [place] - [address] [place] [address] Phone: [phone] Fax: [phone]  [facility] [address] [place] [address] Phone: [phone] Fax: [phone]     Social Determinants of Health (SDOH) Social History: SDOH Screenings   Food Insecurity: No Food Insecurity (04/02/2023)  Housing: Low Risk  (04/02/2023)  Transportation Needs: No Transportation Needs (04/02/2023)  Utilities: Not At Risk (04/02/2023)  Depression (PHQ2-9): Low Risk  (05/29/2022)  Tobacco Use: Low Risk  (04/01/2023)   SDOH Interventions:     Readmission Risk Interventions    05/08/2022    3:02 PM 05/01/2022    2:36 PM  Readmission Risk Prevention Plan  Transportation Screening Complete Complete  PCP or Specialist Appt within 5-7 Days  Complete  PCP or Specialist Appt within 3-5 Days Complete   Home Care Screening  Complete  Medication Review (RN CM)  Complete  HRI or Home Care Consult Complete   Social Work Consult for Recovery Care Planning/Counseling Complete   Palliative Care Screening Not Applicable   Medication Review Oceanographer) Complete

## 2023-04-03 NOTE — Assessment & Plan Note (Signed)
-   Continue Flomax 

## 2023-04-03 NOTE — Assessment & Plan Note (Signed)
-   Continue Sinemet and Comtan; outpatient follow-up with neurology to be rescheduled as was originally planned for 04/04/2023

## 2023-04-03 NOTE — Assessment & Plan Note (Signed)
-   BP stable for now; resume home regimen at d/c

## 2023-04-03 NOTE — Assessment & Plan Note (Signed)
-   A1c 11.7% on admission - DM coordinator has met with patient/family as well -Patient does not routinely check CBGs at home; he is only on Lantus for control, no short acting insulin -Tentative plan may be for CGM at discharge - Regardless, will need basal and short acting insulins at discharge

## 2023-04-03 NOTE — Hospital Course (Signed)
[name] is a 77 yo male with PMH Parkinson's disease, recurrent UTIs, uncontrolled DM II, BPH, arthritis, dysphagia, GERD, HTN, HLD who presented with altered mentation and hyperglycemia. He does not check glucose levels routinely at home and also underwent steroid injection in his hip for bursitis.  Upon returning home from steroid injection, his glucose levels were 575 and he was brought to the ER for further evaluation. He underwent further workup for his altered mentation and urinalysis was notable for small LE, positive nitrite, many bacteria, 21-50 WBC.  He has previously been followed by ID outpatient for his recurrent UTIs notably growing Klebsiella ESBL in the past.  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 He was started on meropenem for UTI along with insulin regimen for his hyperglycemia.  Mentation slowly improved. Urine culture results with non-ESBL Klebsiella.  He was de-escalated to cefadroxil to complete 7-day course total at discharge.  He also underwent diabetes counseling with the diabetes educator during hospitalization.  Basal insulin regimen was increased with further instructions to slowly uptitrate if CBGs still elevated at discharge.  He was also initiated on Humalog sliding scale.  Diabetes coordinator also supplied Dexcom CGM and refills sent to his pharmacy.

## 2023-04-03 NOTE — Telephone Encounter (Signed)
Patient is in the hospital at [facility] long, isnt coming in for his appointment 04/04/23 / KB

## 2023-04-03 NOTE — Assessment & Plan Note (Signed)
-   baseline creatinine ~ 0.8 - 0.9 - patient presents with increase in creat >0.3 mg/dL above baseline, creat increase >1.5x baseline presumed to have occurred within past 7 days PTA - suspected prerenal - briefly on IVF - creat 1.37 on admission - improved to 1.1 prior to discharge

## 2023-04-03 NOTE — Assessment & Plan Note (Signed)
-   patient symptoms include AMS - etiology considered due to metabolic

## 2023-04-03 NOTE — Assessment & Plan Note (Signed)
-   Continue aspirin, toprol, Crestor

## 2023-04-03 NOTE — Progress Notes (Signed)
Progress Note    [name]   ZOX:[id number]  DOB: [birth year]  DOA: 04/01/2023     2 PCP: Eric Aspen, MD  Initial CC: AMS, elevated glucose   Hospital Course: [name] is a 77 yo male with PMH Parkinson's disease, recurrent UTIs, uncontrolled DM II, BPH, arthritis, dysphagia, GERD, HTN, HLD who presented with altered mentation and hyperglycemia. He does not check glucose levels routinely at home and also underwent steroid injection in his hip for bursitis.  Upon returning home from steroid injection, his glucose levels were 575 and he was brought to the ER for further evaluation. He underwent further workup for his altered mentation and urinalysis was notable for small LE, positive nitrite, many bacteria, 21-50 WBC.  He has previously been followed by ID outpatient for his recurrent UTIs notably growing Klebsiella ESBL in the past.  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 He was started on meropenem for UTI along with insulin regimen for his hyperglycemia.  Mentation slowly improved.  Interval History:  No events overnight.  Wife and daughter present bedside this morning.  He was sitting up in recliner appearing comfortable and mentation seems to be improved and back to baseline.  No acute concerns this morning. Glucose levels have returned back to normal limits.  Assessment and Plan: * Sepsis due to urinary tract infection (HCC) - UA noted with small LE, positive nitrite, many bacteria, 21-50 WBC; also febrile, leukocytosis, tachypnea - given hx Kleb ESBL he was started on meropenem - Ucx has since speciated to non-ESBL Kleb; okay to de-escalate to Rocephin and finish total of 7 day course  Acute metabolic encephalopathy-resolved as of 04/03/2023 - patient symptoms include AMS - etiology considered due to metabolic  AKI (acute kidney injury) (HCC) - baseline creatinine ~ 0.8 - 0.9 - patient presents with increase in creat >0.3 mg/dL above baseline, creat increase  >1.5x baseline presumed to have occurred within past 7 days PTA - suspected prerenal - briefly on IVF - creat 1.37 on admission - continue trending    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) - A1c 11.7% on admission - DM coordinator has met with patient/family as well -Patient does not routinely check CBGs at home; he is only on Lantus for control, no short acting insulin -Tentative plan may be for CGM at discharge - Regardless, will need basal and short acting insulins at discharge  Elevated troponin - no CP; suspected demand; on trending has decreased; EKG negative for ischemia; prior ILBBB noted on prior EKGs as well - no further workup  Coronary artery disease involving native coronary 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 angina pectoris - Continue aspirin, Crestor - Toprol on hold  HTN (hypertension) - BP stable for now; hold Toprol   BPH (benign prostatic hyperplasia) - Continue Flomax  Parkinson disease (HCC) - Continue Sinemet and Comtan; outpatient follow-up with neurology to be rescheduled as was originally planned for 04/04/2023   Old records reviewed in assessment of this patient  Antimicrobials: Meropenem 6/17 >> 6/19 Rocephin 6/19 >> current  DVT prophylaxis:  enoxaparin (LOVENOX) injection 40 mg Start: 04/01/23 2200 SCDs Start: 04/01/23 2120   Code Status:   Code Status: Full Code  Mobility Assessment (last 72 hours)     Mobility Assessment     Row Name 04/03/23 1135 04/02/23 1952 04/02/23 1328 04/02/23 1313 04/01/23 2250   Does patient have an order for bedrest or is patient medically unstable No - Continue assessment No -  Continue assessment -- -- No - Continue assessment   What is the highest level of mobility based on the progressive mobility assessment? Level 5 (Walks with assist in room/hall) - Balance while stepping forward/back and can walk in room with assist - Complete -- Level 5 (Walks with assist in room/hall) - Balance while stepping  forward/back and can walk in room with assist - Complete Level 5 (Walks with assist in room/hall) - Balance while stepping forward/back and can walk in room with assist - Complete --            Barriers to discharge: none Disposition Plan:  Home Status is: Inpt  Objective: Blood pressure (!) 140/78, pulse 68, temperature 98.7 F (37.1 C), temperature source Oral, resp. rate 17, height 6' (1.829 m), weight 90.8 kg, SpO2 99 %.  Examination:  Physical Exam Constitutional:      Appearance: Normal appearance.     Comments: Slowed mentation   HENT:     Head: Normocephalic and atraumatic.     Mouth/Throat:     Mouth: Mucous membranes are moist.  Eyes:     Extraocular Movements: Extraocular movements intact.  Cardiovascular:     Rate and Rhythm: Normal rate and regular rhythm.  Pulmonary:     Effort: Pulmonary effort is normal.     Breath sounds: Normal breath sounds.  Abdominal:     General: Bowel sounds are normal. There is no distension.     Palpations: Abdomen is soft.     Tenderness: There is no abdominal tenderness.  Musculoskeletal:        General: Normal range of motion.     Cervical back: Normal range of motion and neck supple.  Skin:    General: Skin is warm and dry.  Neurological:     General: No focal deficit present.     Mental Status: He is alert.     Sensory: Sensation is intact.     Motor: No tremor.      Consultants:    Procedures:    Data Reviewed: Results for orders placed or performed during the hospital encounter of 04/01/23 (from the past 24 hour(s))  Glucose, capillary     Status: Abnormal   Collection Time: 04/02/23  4:19 PM  Result Value Ref Range   Glucose-Capillary 156 (H) 70 - 99 mg/dL  Brain natriuretic peptide     Status: Abnormal   Collection Time: 04/02/23  5:52 PM  Result Value Ref Range   B Natriuretic Peptide 660.0 (H) 0.0 - 100.0 pg/mL  Troponin I (High Sensitivity)     Status: Abnormal   Collection Time: 04/02/23  5:52 PM   Result Value Ref Range   Troponin I (High Sensitivity) 30 (H) <18 ng/L  Troponin I (High Sensitivity)     Status: Abnormal   Collection Time: 04/02/23  7:03 PM  Result Value Ref Range   Troponin I (High Sensitivity) 35 (H) <18 ng/L  Glucose, capillary     Status: Abnormal   Collection Time: 04/02/23  8:09 PM  Result Value Ref Range   Glucose-Capillary 210 (H) 70 - 99 mg/dL  Glucose, capillary     Status: Abnormal   Collection Time: 04/03/23 12:05 AM  Result Value Ref Range   Glucose-Capillary 126 (H) 70 - 99 mg/dL  Glucose, capillary     Status: Abnormal   Collection Time: 04/03/23  3:25 AM  Result Value Ref Range   Glucose-Capillary 104 (H) 70 - 99 mg/dL  Glucose, capillary  Status: Abnormal   Collection Time: 04/03/23  7:24 AM  Result Value Ref Range   Glucose-Capillary 185 (H) 70 - 99 mg/dL  Glucose, capillary     Status: Abnormal   Collection Time: 04/03/23 12:22 PM  Result Value Ref Range   Glucose-Capillary 301 (H) 70 - 99 mg/dL    I have reviewed pertinent nursing notes, vitals, labs, and images as necessary. I have ordered labwork to follow up on as indicated.  I have reviewed the last notes from staff over past 24 hours. I have discussed patient's care plan and test results with nursing staff, CM/SW, and other staff as appropriate.  Time spent: Greater than 50% of the 55 minute visit was spent in counseling/coordination of care for the patient as laid out in the A&P.   LOS: 2 days   Lewie Chamber, MD [facility] 04/03/2023, 4:12 PM

## 2023-04-03 NOTE — Plan of Care (Signed)
°  Problem: Education: °Goal: Knowledge of General Education information will improve °Description: Including pain rating scale, medication(s)/side effects and non-pharmacologic comfort measures °Outcome: Progressing °  °Problem: Health Behavior/Discharge Planning: °Goal: Ability to manage health-related needs will improve °Outcome: Progressing °  °Problem: Clinical Measurements: °Goal: Ability to maintain clinical measurements within normal limits will improve °Outcome: Progressing °Goal: Will remain free from infection °Outcome: Progressing °Goal: Diagnostic test results will improve °Outcome: Progressing °Goal: Respiratory complications will improve °Outcome: Progressing °Goal: Cardiovascular complication will be avoided °Outcome: Progressing °  °Problem: Safety: °Goal: Ability to remain free from injury will improve °Outcome: Progressing °  °Problem: Skin Integrity: °Goal: Risk for impaired skin integrity will decrease °Outcome: Progressing °  °

## 2023-04-04 ENCOUNTER — Ambulatory Visit: Payer: Medicare Other | Admitting: Neurology

## 2023-04-04 ENCOUNTER — Telehealth: Payer: Self-pay | Admitting: Neurology

## 2023-04-04 DIAGNOSIS — N179 Acute kidney failure, unspecified: Secondary | ICD-10-CM | POA: Diagnosis not present

## 2023-04-04 DIAGNOSIS — E1165 Type 2 diabetes mellitus with hyperglycemia: Secondary | ICD-10-CM | POA: Diagnosis not present

## 2023-04-04 DIAGNOSIS — A419 Sepsis, unspecified organism: Secondary | ICD-10-CM | POA: Diagnosis not present

## 2023-04-04 DIAGNOSIS — G9341 Metabolic encephalopathy: Secondary | ICD-10-CM | POA: Diagnosis not present

## 2023-04-04 LAB — BASIC METABOLIC PANEL
Anion gap: 7 (ref 5–15)
BUN: 20 mg/dL (ref 8–23)
CO2: 27 mmol/L (ref 22–32)
Calcium: 8.3 mg/dL — ABNORMAL LOW (ref 8.9–10.3)
Chloride: 103 mmol/L (ref 98–111)
Creatinine, Ser: 1.1 mg/dL (ref 0.61–1.24)
GFR, Estimated: >60 mL/min (ref >60)
Glucose, Bld: 71 mg/dL (ref 70–99)
Potassium: 4.3 mmol/L (ref 3.5–5.1)
Sodium: 137 mmol/L (ref 135–145)

## 2023-04-04 LAB — GLUCOSE, CAPILLARY
Glucose-Capillary: 173 mg/dL — ABNORMAL HIGH (ref 70–99)
Glucose-Capillary: 267 mg/dL — ABNORMAL HIGH (ref 70–99)
Glucose-Capillary: 355 mg/dL — ABNORMAL HIGH (ref 70–99)
Glucose-Capillary: 98 mg/dL (ref 70–99)

## 2023-04-04 LAB — MAGNESIUM: Magnesium: 2.3 mg/dL (ref 1.7–2.4)

## 2023-04-04 LAB — CULTURE, BLOOD (ROUTINE X 2)

## 2023-04-04 MED ORDER — INSULIN ASPART 100 UNIT/ML IJ SOLN: 0.0000 [IU] | Freq: Every day | INTRAMUSCULAR | Status: DC

## 2023-04-04 MED ORDER — INSULIN LISPRO (1 UNIT DIAL) 100 UNIT/ML (KWIKPEN): 2.0000 [IU] | PEN_INJECTOR | Freq: Three times a day (TID) | SUBCUTANEOUS | 11 refills | Status: AC

## 2023-04-04 MED ORDER — INSULIN ASPART 100 UNIT/ML IJ SOLN: 0.0000 [IU] | Freq: Three times a day (TID) | INTRAMUSCULAR | Status: DC

## 2023-04-04 MED ORDER — ORAL CARE MOUTH RINSE: 15.0000 mL | OROMUCOSAL | Status: DC | PRN

## 2023-04-04 MED ORDER — INSULIN ASPART 100 UNIT/ML IJ SOLN
0.0000 [IU] | Freq: Three times a day (TID) | INTRAMUSCULAR | Status: DC
  Administered 2023-04-04: 11 [IU] via SUBCUTANEOUS
  Administered 2023-04-04: 4 [IU] via SUBCUTANEOUS

## 2023-04-04 MED ORDER — DEXCOM G7 SENSOR MISC: 1.0000 | 5 refills | Status: AC | PRN

## 2023-04-04 MED ORDER — INSULIN DETEMIR 100 UNIT/ML ~~LOC~~ SOLN
25.0000 [IU] | Freq: Every day | SUBCUTANEOUS | Status: DC
  Administered 2023-04-04: 25 [IU] via SUBCUTANEOUS
  Filled 2023-04-04: qty 0.25

## 2023-04-04 MED ORDER — LANTUS SOLOSTAR 100 UNIT/ML ~~LOC~~ SOPN: 25.0000 [IU] | PEN_INJECTOR | Freq: Every day | SUBCUTANEOUS | 11 refills | Status: DC

## 2023-04-04 MED ORDER — CEFADROXIL 500 MG PO CAPS
1000.0000 mg | ORAL_CAPSULE | Freq: Two times a day (BID) | ORAL | Status: DC
  Administered 2023-04-04: 1000 mg via ORAL
  Filled 2023-04-04: qty 2

## 2023-04-04 MED ORDER — CEFADROXIL 500 MG PO CAPS: 1000.0000 mg | ORAL_CAPSULE | Freq: Two times a day (BID) | ORAL | 0 refills | Status: AC

## 2023-04-04 NOTE — Telephone Encounter (Signed)
Called patient's wife and left voice mail message.

## 2023-04-04 NOTE — Telephone Encounter (Signed)
[name] is in the hospital at [facility] long since Monday. He has advanced parkinson's and has gotten worse and fallen several times. He does have a UTI and his blood sugar was almost 600. He might get to go home today and they might have home health. The wife wants to know if there is anything else they could do. She would like to get him a antidepressant without him knowing b/c he will not take one. He is very depressed. She wants to talk to Dr.[name] in private

## 2023-04-04 NOTE — Discharge Summary (Signed)
Physician Discharge Summary   [name] ZOX:[id number] DOB: Jul 02, 1946 DOA: 04/01/2023  PCP: Emilio Aspen, MD  Admit date: 04/01/2023 Discharge date: 04/04/2023   Admitted From: Home Disposition:  Home Discharging physician: Lewie Chamber, MD Barriers to discharge: none  Recommendations at discharge: May need further insulin adjustment at followup Discuss possible initiation of anti-depressant Follow up with neurology  Home Health: PT  Discharge Condition: stable CODE STATUS: Full Diet recommendation:  Diet Orders (From admission, onward)     Start     Ordered   04/04/23 0000  Diet Carb Modified        04/04/23 1234   04/01/23 2120  Diet regular Room service appropriate? Yes; Fluid consistency: Thin  Diet effective now       Question Answer Comment  Room service appropriate? Yes   Fluid consistency: Thin      04/01/23 2122            Hospital Course: [name] is a 77 yo male with PMH Parkinson's disease, recurrent UTIs, uncontrolled DM II, BPH, arthritis, dysphagia, GERD, HTN, HLD who presented with altered mentation and hyperglycemia. He does not check glucose levels routinely at home and also underwent steroid injection in his hip for bursitis.  Upon returning home from steroid injection, his glucose levels were 575 and he was brought to the ER for further evaluation. He underwent further workup for his altered mentation and urinalysis was notable for small LE, positive nitrite, many bacteria, 21-50 WBC.  He has previously been followed by ID outpatient for his recurrent UTIs notably growing Klebsiella ESBL in the past.  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 He was started on meropenem for UTI along with insulin regimen for his hyperglycemia.  Mentation slowly improved. Urine culture results with non-ESBL Klebsiella.  He was de-escalated to cefadroxil to complete 7-day course total at discharge.  He also underwent diabetes counseling with the  diabetes educator during hospitalization.  Basal insulin regimen was increased with further instructions to slowly uptitrate if CBGs still elevated at discharge.  He was also initiated on Humalog sliding scale.  Diabetes coordinator also supplied Dexcom CGM and refills sent to his pharmacy.  Assessment and Plan: * Sepsis due to urinary tract infection (HCC)-resolved as of 04/04/2023 - UA noted with small LE, positive nitrite, many bacteria, 21-50 WBC; also febrile, leukocytosis, tachypnea - given hx Kleb ESBL he was started on meropenem - Ucx has since speciated to non-ESBL Kleb; okay to de-escalate to Rocephin and finish total of 7 day course; discharged with cefadroxil to finish course  Acute metabolic encephalopathy-resolved as of 04/03/2023 - patient symptoms include AMS - etiology considered due to metabolic  AKI (acute kidney injury) (HCC) - baseline creatinine ~ 0.8 - 0.9 - patient presents with increase in creat >0.3 mg/dL above baseline, creat increase >1.5x baseline presumed to have occurred within past 7 days PTA - suspected prerenal - briefly on IVF - creat 1.37 on admission - improved to 1.1 prior to discharge  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) - A1c 11.7% on admission - DM coordinator has met with patient/family as well -Patient does not routinely check CBGs at home; he is only on Lantus for control, no short acting insulin -Dexcom CGM supplied prior to discharge - basal increased to 25 units daily and also added on Humalog SSI - further adjustment outpatient   Elevated troponin - no CP; suspected demand; on trending has decreased; EKG negative for ischemia; prior  ILBBB noted on prior EKGs as well - no further workup  Coronary artery disease involving native coronary 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 angina pectoris - Continue aspirin, toprol, Crestor  HTN (hypertension) - BP stable for now; resume home regimen at d/c  BPH (benign prostatic  hyperplasia) - Continue Flomax  Parkinson disease (HCC) - Continue Sinemet and Comtan; outpatient follow-up with neurology to be rescheduled as was originally planned for 04/04/2023   The patient's chronic medical conditions were treated accordingly per the patient's home medication regimen except as noted.  On day of discharge, patient was felt deemed stable for discharge. Patient/family member advised to call PCP or come back to ER if needed.   Principal Diagnosis: Sepsis due to urinary tract infection [facility])  Discharge Diagnoses: Active Hospital Problems   Diagnosis Date Noted   AKI (acute kidney injury) [facility])     Priority: 3.   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) 02/18/2018    Priority: 3.   Elevated troponin 04/03/2023   Coronary artery disease involving native coronary 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 angina pectoris 01/11/2023   HTN (hypertension) 12/31/2021   Parkinson disease (HCC)    BPH (benign prostatic hyperplasia)     Resolved Hospital Problems   Diagnosis Date Noted Date Resolved   Sepsis due to urinary tract infection (HCC) 04/01/2023 04/04/2023    Priority: 1.   Acute metabolic encephalopathy 04/03/2023 04/03/2023    Priority: 2.     Discharge Instructions     Diet Carb Modified   Complete by: As directed    Increase activity slowly   Complete by: As directed       Allergies as of 04/04/2023       Reactions   Nucynta [tapentadol] Other (See Comments)   Made the patient not feel well. "He did not feel well at all."   Oxycodone Itching   Sudafed [pseudoephedrine Hcl] Other (See Comments)   Problems urinating    Tramadol Hcl Itching, Other (See Comments)   Can tolerate, however (in 2023)        Medication List     TAKE these medications    acetaminophen 500 MG tablet Commonly known as: TYLENOL Take 500-1,000 mg by mouth every 6 (six) hours as needed (for pain).   aspirin EC 81 MG tablet Take 81 mg by mouth daily. Swallow whole.    carbidopa-levodopa 25-100 MG tablet Commonly known as: SINEMET IR TAKE 3 TABLETS BY MOUTH AT 7:00 AM, 3 TABS AT 10:00 AM, 3 TABS AT 12:00 PM, 3 TABS AT 2:00 PM, 3 TABS AT 4:00, AND 3 TABS AT 6:00 PM What changed: See the new instructions.   cefadroxil 500 MG capsule Commonly known as: DURICEF Take 2 capsules (1,000 mg total) by mouth 2 (two) times daily for 4 days.   clopidogrel 75 MG tablet Commonly known as: PLAVIX Take 1 tablet (75 mg total) by mouth daily with breakfast.   cyanocobalamin 1000 MCG tablet Commonly known as: VITAMIN B12 Take 2,000 mcg by mouth daily.   Dexcom G7 Sensor Misc 1 each by Does not apply route as needed.   entacapone 200 MG tablet Commonly known as: COMTAN TAKE 1 TABLET BY MOUTH 3 TIMES DAILY (WITH FIRST THREE DOSAGES OF CARBIDOPA/LEVODOPA). What changed: See the new instructions.   Entresto 24-26 MG Generic drug: sacubitril-valsartan TAKE 1 TABLET BY MOUTH 2 TIMES DAILY.   furosemide 20 MG tablet Commonly known as: LASIX Take 2 tablets (40 mg total) by mouth daily.  Global Ease Inject Pen Needles 31G X 5 MM Misc Generic drug: Insulin Pen Needle Inject into the skin.   ibuprofen 200 MG tablet Commonly known as: ADVIL Take 400 mg by mouth daily.   Inbrija 42 MG Caps Generic drug: Levodopa Samples of this drug were given to the patient, quantity 1, Lot Number [id number] exp 12-24   insulin lispro 100 UNIT/ML KwikPen Commonly known as: HUMALOG Inject 2-15 Units into the skin 4 (four) times daily -  before meals and at bedtime. CBG 121 - 150: 2 units, CBG 151 - 200: 3 units, CBG 201 - 250: 5 units, CBG 251 - 300: 8 units, CBG 301 - 350: 11 units, CBG 351 - 400: 15 units   Lantus SoloStar 100 UNIT/ML Solostar Pen Generic drug: insulin glargine Inject 25 Units into the skin at bedtime. What changed: how much to take   methenamine 1 g tablet Commonly known as: HIPREX TAKE 1 TABLET BY MOUTH 2 TIMES DAILY. TAKE WITH ANY VITAMIN C, TWICE  A DAY FOR UTI PREVENTION What changed: See the new instructions.   metoprolol succinate 50 MG 24 hr tablet Commonly known as: TOPROL-XL Take 1 tablet (50 mg total) by mouth daily. Take with or immediately following a meal.   nitroGLYCERIN 0.4 MG SL tablet Commonly known as: Nitrostat Place 1 tablet (0.4 mg total) under the tongue every 5 (five) minutes as needed.   pantoprazole 40 MG tablet Commonly known as: Protonix Take 1 tablet (40 mg total) by mouth daily.   potassium chloride 10 MEQ tablet Commonly known as: KLOR-CON Take 10 mEq by mouth 2 (two) times daily.   rOPINIRole 3 MG tablet Commonly known as: REQUIP TAKE 1 TABLET IN THE MORNING, TAKE 1 IN THE AFTERNOON, AND 1/2 TABLET IN THE EVENING What changed: See the new instructions.   rosuvastatin 40 MG tablet Commonly known as: Crestor Take 1 tablet (40 mg total) by mouth daily.   tamsulosin 0.4 MG Caps capsule Commonly known as: FLOMAX Take 0.4 mg by mouth in the morning and at bedtime.   triamcinolone 55 MCG/ACT Aero nasal inhaler Commonly known as: NASACORT Place 2 sprays into the nose daily as needed (for allergies or rhinitis).   Tums 500 MG chewable tablet Generic drug: calcium carbonate Chew 2 tablets by mouth daily as needed for indigestion or heartburn.   vitamin C 100 MG tablet Take 100 mg by mouth in the morning and at bedtime.   Vitamin D 50 MCG (2000 UT) tablet Take 2,000 Units by mouth daily.        Follow-up Information     [facility] Follow up.   Specialty: Home Health Services Why: They will call you to arrange appointment Contact information: [address] [place] [address] [phone]         [facility] Follow up.   Specialty: Home Health Services Contact information: [address] [address] [place] [address] [phone]                Allergies  Allergen Reactions   Nucynta [Tapentadol] Other (See Comments)    Made the  patient not feel well. "He did not feel well at all."   Oxycodone Itching   Sudafed [Pseudoephedrine Hcl] Other (See Comments)    Problems urinating    Tramadol Hcl Itching and Other (See Comments)    Can tolerate, however (in 2023)    Consultations:   Procedures:   Discharge Exam: BP Marland Kitchen)  141/72 (BP Location: Left Arm)   Pulse 67   Temp 98.5 F (36.9 C) (Oral)   Resp (!) 22   Ht 6' (1.829 m)   Wt 90.8 kg   SpO2 97%   BMI 27.15 kg/m  Physical Exam Constitutional:      Appearance: Normal appearance.     Comments: Slowed mentation   HENT:     Head: Normocephalic and atraumatic.     Mouth/Throat:     Mouth: Mucous membranes are moist.  Eyes:     Extraocular Movements: Extraocular movements intact.  Cardiovascular:     Rate and Rhythm: Normal rate and regular rhythm.  Pulmonary:     Effort: Pulmonary effort is normal.     Breath sounds: Normal breath sounds.  Abdominal:     General: Bowel sounds are normal. There is no distension.     Palpations: Abdomen is soft.     Tenderness: There is no abdominal tenderness.  Musculoskeletal:        General: Normal range of motion.     Cervical back: Normal range of motion and neck supple.  Skin:    General: Skin is warm and dry.  Neurological:     General: No focal deficit present.     Mental Status: He is alert.     Sensory: Sensation is intact.     Motor: No tremor.      The results of significant diagnostics from this hospitalization (including imaging, microbiology, ancillary and laboratory) are listed below for reference.   Microbiology: Recent Results (from the past 240 hour(s))  Blood Culture (routine x 2)     Status: None (Preliminary result)   Collection Time: 04/01/23  3:59 PM   Specimen: BLOOD  Result Value Ref Range Status   Specimen Description   Final    BLOOD LEFT ANTECUBITAL Performed at [facility], [address], [place], [place] [address]    Special Requests   Final     BOTTLES DRAWN AEROBIC AND ANAEROBIC Blood Culture results may not be optimal due to an inadequate volume of blood received in culture bottles Performed at [facility], [address], [place], [place] [address]    Culture   Final    NO GROWTH 3 DAYS Performed at [facility], [address], [place], [place] [address]    Report Status PENDING  Incomplete  Blood Culture (routine x 2)     Status: None (Preliminary result)   Collection Time: 04/01/23  4:05 PM   Specimen: BLOOD RIGHT FOREARM  Result Value Ref Range Status   Specimen Description   Final    BLOOD RIGHT FOREARM Performed at [facility], [address], [place], [place] [address]    Special Requests   Final    BOTTLES DRAWN AEROBIC AND ANAEROBIC Blood Culture adequate volume Performed at [facility], [address] [place], [place] [address]    Culture   Final    NO GROWTH 3 DAYS Performed at [facility], [address] [place], [place] [address]    Report Status PENDING  Incomplete  Resp panel by RT-PCR (RSV, Flu A&B, Covid) Anterior Nasal Swab     Status: None   Collection Time: 04/01/23  4:10 PM   Specimen: Anterior Nasal Swab  Result Value Ref Range Status   SARS Coronavirus 2 by RT PCR NEGATIVE NEGATIVE Final    Comment: (NOTE) SARS-CoV-2 target nucleic acids are NOT  DETECTED.  The SARS-CoV-2 RNA is generally detectable in upper respiratory specimens during the acute phase of infection. The lowest concentration of SARS-CoV-2 viral copies this assay can detect is 138 copies/mL. A negative result does not preclude SARS-Cov-2 infection and should not be used as the sole basis for treatment or other patient management decisions. A negative result may occur with  improper specimen collection/handling, submission of specimen other than nasopharyngeal swab, presence of viral mutation(s) within the areas targeted by this assay, and inadequate  number of viral copies(<138 copies/mL). A negative result must be combined with clinical observations, patient history, and epidemiological information. The expected result is Negative.  Fact Sheet for Patients:  BloggerCourse.com  Fact Sheet for Healthcare Providers:  SeriousBroker.it  This test is no t yet approved or cleared by the [place] FDA and  has been authorized for detection and/or diagnosis of SARS-CoV-2 by FDA under an Emergency Use Authorization (EUA). This EUA will remain  in effect (meaning this test can be used) for the duration of the COVID-19 declaration under Section 564(b)(1) of the Act, 21 U.S.C.section 360bbb-3(b)(1), unless the authorization is terminated  or revoked sooner.       Influenza A by PCR NEGATIVE NEGATIVE Final   Influenza B by PCR NEGATIVE NEGATIVE Final    Comment: (NOTE) The Xpert Xpress SARS-CoV-2/FLU/RSV plus assay is intended as an aid in the diagnosis of influenza from Nasopharyngeal swab specimens and should not be used as a sole basis for treatment. Nasal washings and aspirates are unacceptable for Xpert Xpress SARS-CoV-2/FLU/RSV testing.  Fact Sheet for Patients: BloggerCourse.com  Fact Sheet for Healthcare Providers: SeriousBroker.it  This test is not yet approved or cleared by the [place] FDA and has been authorized for detection and/or diagnosis of SARS-CoV-2 by FDA under an Emergency Use Authorization (EUA). This EUA will remain in effect (meaning this test can be used) for the duration of the COVID-19 declaration under Section 564(b)(1) of the Act, 21 U.S.C. section 360bbb-3(b)(1), unless the authorization is terminated or revoked.     Resp Syncytial Virus by PCR NEGATIVE NEGATIVE Final    Comment: (NOTE) Fact Sheet for Patients: BloggerCourse.com  Fact Sheet for Healthcare  Providers: SeriousBroker.it  This test is not yet approved or cleared by the [place] FDA and has been authorized for detection and/or diagnosis of SARS-CoV-2 by FDA under an Emergency Use Authorization (EUA). This EUA will remain in effect (meaning this test can be used) for the duration of the COVID-19 declaration under Section 564(b)(1) of the Act, 21 U.S.C. section 360bbb-3(b)(1), unless the authorization is terminated or revoked.  Performed at [facility], [address], [place], [place] [address]   Urine Culture (for pregnant, neutropenic or urologic patients or patients with an indwelling urinary catheter)     Status: Abnormal   Collection Time: 04/01/23  6:44 PM   Specimen: Urine, Clean Catch  Result Value Ref Range Status   Specimen Description   Final    URINE, CLEAN CATCH Performed at [facility], [address] [place], [place] [address]    Special Requests   Final    NONE Performed at [facility], [address] [place], [place] [address]    Culture >=100,000 COLONIES/mL KLEBSIELLA PNEUMONIAE (A)  Final   Report Status 04/03/2023 FINAL  Final   Organism ID, Bacteria KLEBSIELLA PNEUMONIAE (A)  Final      Susceptibility   Klebsiella pneumoniae - MIC*    AMPICILLIN >=32  RESISTANT Resistant     CEFAZOLIN <=4 SENSITIVE Sensitive     CEFEPIME <=0.12 SENSITIVE Sensitive     CEFTRIAXONE <=0.25 SENSITIVE Sensitive     CIPROFLOXACIN 1 RESISTANT Resistant     GENTAMICIN >=16 RESISTANT Resistant     IMIPENEM <=0.25 SENSITIVE Sensitive     NITROFURANTOIN 64 INTERMEDIATE Intermediate     TRIMETH/SULFA <=20 SENSITIVE Sensitive     AMPICILLIN/SULBACTAM >=32 RESISTANT Resistant     PIP/TAZO <=4 SENSITIVE Sensitive     * >=100,000 COLONIES/mL KLEBSIELLA PNEUMONIAE     Labs: BNP (last 3 results) Recent Labs    05/06/22 0547 05/07/22 0514 04/02/23 1752  BNP 369.1* 266.5* 660.0*    Basic Metabolic Panel: Recent Labs  Lab 04/01/23 1607 04/02/23 0450 04/04/23 0503  NA 134* 136 137  K 4.1 3.5 4.3  CL 96* 103 103  CO2 27 27 27   GLUCOSE 369* 134* 71  BUN 25* 20 20  CREATININE 1.37* 1.27* 1.10  CALCIUM 8.9 8.2* 8.3*  MG  --   --  2.3   Liver Function Tests: Recent Labs  Lab 04/01/23 1607  AST 7*  ALT 7  ALKPHOS 105  BILITOT 0.9  PROT 6.9  ALBUMIN 3.6   No results for input(s): "LIPASE", "AMYLASE" in the last 168 hours. No results for input(s): "AMMONIA" in the last 168 hours. CBC: Recent Labs  Lab 04/01/23 1607 04/02/23 0450  WBC 10.7* 9.2  NEUTROABS 9.6*  --   HGB 15.6 13.3  HCT 46.5 39.9  MCV 91.9 91.3  PLT 213 207   Cardiac Enzymes: No results for input(s): "CKTOTAL", "CKMB", "CKMBINDEX", "TROPONINI" in the last 168 hours. BNP: Invalid input(s): "POCBNP" CBG: Recent Labs  Lab 04/03/23 2008 04/04/23 0025 04/04/23 0443 04/04/23 0741 04/04/23 1149  GLUCAP 199* 355* 98 173* 267*   D-Dimer No results for input(s): "DDIMER" in the last 72 hours. Hgb A1c Recent Labs    04/02/23 0450  HGBA1C 11.7*   Lipid Profile No results for input(s): "CHOL", "HDL", "LDLCALC", "TRIG", "CHOLHDL", "LDLDIRECT" in the last 72 hours. Thyroid function studies No results for input(s): "TSH", "T4TOTAL", "T3FREE", "THYROIDAB" in the last 72 hours.  Invalid input(s): "FREET3" Anemia work up No results for input(s): "VITAMINB12", "FOLATE", "FERRITIN", "TIBC", "IRON", "RETICCTPCT" in the last 72 hours. Urinalysis    Component Value Date/Time   COLORURINE YELLOW 04/01/2023 1844   APPEARANCEUR HAZY (A) 04/01/2023 1844   LABSPEC 1.016 04/01/2023 1844   PHURINE 6.0 04/01/2023 1844   GLUCOSEU >=500 (A) 04/01/2023 1844   HGBUR MODERATE (A) 04/01/2023 1844   BILIRUBINUR NEGATIVE 04/01/2023 1844   KETONESUR NEGATIVE 04/01/2023 1844   PROTEINUR 30 (A) 04/01/2023 1844   UROBILINOGEN 0.2 10/25/2009 1404   NITRITE POSITIVE (A) 04/01/2023 1844    LEUKOCYTESUR SMALL (A) 04/01/2023 1844   Sepsis Labs Recent Labs  Lab 04/01/23 1607 04/02/23 0450  WBC 10.7* 9.2   Microbiology Recent Results (from the past 240 hour(s))  Blood Culture (routine x 2)     Status: None (Preliminary result)   Collection Time: 04/01/23  3:59 PM   Specimen: BLOOD  Result Value Ref Range Status   Specimen Description   Final    BLOOD LEFT ANTECUBITAL Performed at [facility], [address] [place], [place] [address]    Special Requests   Final    BOTTLES DRAWN AEROBIC AND ANAEROBIC Blood Culture results may not be optimal due to an inadequate volume of blood received in culture bottles Performed at [facility], [address], [place], [place] [address]    Culture   Final    NO GROWTH 3 DAYS Performed at [facility], [address], [place], [place] [address]    Report Status PENDING  Incomplete  Blood Culture (routine x 2)     Status: None (Preliminary result)   Collection Time: 04/01/23  4:05 PM   Specimen: BLOOD RIGHT FOREARM  Result Value Ref Range Status   Specimen Description   Final    BLOOD RIGHT FOREARM Performed at [facility], [address], [place], [place] [address]    Special Requests   Final    BOTTLES DRAWN AEROBIC AND ANAEROBIC Blood Culture adequate volume Performed at [facility], [address], [place], [place] [address]    Culture   Final    NO GROWTH 3 DAYS Performed at [facility], [address], [place], [place] [address]    Report Status PENDING  Incomplete  Resp panel by RT-PCR (RSV, Flu A&B, Covid) Anterior Nasal Swab     Status: None   Collection Time: 04/01/23  4:10 PM   Specimen: Anterior Nasal Swab  Result Value Ref Range Status   SARS Coronavirus 2 by RT PCR NEGATIVE NEGATIVE Final    Comment: (NOTE) SARS-CoV-2 target nucleic acids are NOT DETECTED.  The SARS-CoV-2 RNA is generally detectable in upper  respiratory specimens during the acute phase of infection. The lowest concentration of SARS-CoV-2 viral copies this assay can detect is 138 copies/mL. A negative result does not preclude SARS-Cov-2 infection and should not be used as the sole basis for treatment or other patient management decisions. A negative result may occur with  improper specimen collection/handling, submission of specimen other than nasopharyngeal swab, presence of viral mutation(s) within the areas targeted by this assay, and inadequate number of viral copies(<138 copies/mL). A negative result must be combined with clinical observations, patient history, and epidemiological information. The expected result is Negative.  Fact Sheet for Patients:  BloggerCourse.com  Fact Sheet for Healthcare Providers:  SeriousBroker.it  This test is no t yet approved or cleared by the [place] FDA and  has been authorized for detection and/or diagnosis of SARS-CoV-2 by FDA under an Emergency Use Authorization (EUA). This EUA will remain  in effect (meaning this test can be used) for the duration of the COVID-19 declaration under Section 564(b)(1) of the Act, 21 U.S.C.section 360bbb-3(b)(1), unless the authorization is terminated  or revoked sooner.       Influenza A by PCR NEGATIVE NEGATIVE Final   Influenza B by PCR NEGATIVE NEGATIVE Final    Comment: (NOTE) The Xpert Xpress SARS-CoV-2/FLU/RSV plus assay is intended as an aid in the diagnosis of influenza from Nasopharyngeal swab specimens and should not be used as a sole basis for treatment. Nasal washings and aspirates are unacceptable for Xpert Xpress SARS-CoV-2/FLU/RSV testing.  Fact Sheet for Patients: BloggerCourse.com  Fact Sheet for Healthcare Providers: SeriousBroker.it  This test is not yet approved or cleared by the [place] FDA and has been  authorized for detection and/or diagnosis of SARS-CoV-2 by FDA under an Emergency Use Authorization (EUA). This EUA will remain in effect (meaning this test can be used) for the duration of the COVID-19 declaration under Section 564(b)(1) of the Act, 21 U.S.C. section 360bbb-3(b)(1), unless the authorization is terminated or revoked.     Resp Syncytial Virus by PCR NEGATIVE NEGATIVE Final    Comment: (NOTE) Fact  Sheet for Patients: BloggerCourse.com  Fact Sheet for Healthcare Providers: SeriousBroker.it  This test is not yet approved or cleared by the [place] FDA and has been authorized for detection and/or diagnosis of SARS-CoV-2 by FDA under an Emergency Use Authorization (EUA). This EUA will remain in effect (meaning this test can be used) for the duration of the COVID-19 declaration under Section 564(b)(1) of the Act, 21 U.S.C. section 360bbb-3(b)(1), unless the authorization is terminated or revoked.  Performed at [facility], [address], [place], [place] [address]   Urine Culture (for pregnant, neutropenic or urologic patients or patients with an indwelling urinary catheter)     Status: Abnormal   Collection Time: 04/01/23  6:44 PM   Specimen: Urine, Clean Catch  Result Value Ref Range Status   Specimen Description   Final    URINE, CLEAN CATCH Performed at [facility], [address] [place], [place] [address]    Special Requests   Final    NONE Performed at [facility], [address] [place], [place] [address]    Culture >=100,000 COLONIES/mL KLEBSIELLA PNEUMONIAE (A)  Final   Report Status 04/03/2023 FINAL  Final   Organism ID, Bacteria KLEBSIELLA PNEUMONIAE (A)  Final      Susceptibility   Klebsiella pneumoniae - MIC*    AMPICILLIN >=32 RESISTANT Resistant     CEFAZOLIN <=4 SENSITIVE Sensitive     CEFEPIME <=0.12 SENSITIVE Sensitive      CEFTRIAXONE <=0.25 SENSITIVE Sensitive     CIPROFLOXACIN 1 RESISTANT Resistant     GENTAMICIN >=16 RESISTANT Resistant     IMIPENEM <=0.25 SENSITIVE Sensitive     NITROFURANTOIN 64 INTERMEDIATE Intermediate     TRIMETH/SULFA <=20 SENSITIVE Sensitive     AMPICILLIN/SULBACTAM >=32 RESISTANT Resistant     PIP/TAZO <=4 SENSITIVE Sensitive     * >=100,000 COLONIES/mL KLEBSIELLA PNEUMONIAE    Procedures/Studies: ECHOCARDIOGRAM COMPLETE  Result Date: 04/02/2023    ECHOCARDIOGRAM REPORT   Patient Name:   [name] Date of Exam: 04/02/2023 Medical Rec #:  [id number]   Height:       72.0 in Accession #:    [id number]  Weight:       198.4 lb Date of Birth:  1946-09-21   BSA:          2.123 m Patient Age:    76 years    BP:           110/61 mmHg Patient Gender: M           HR:           68 bpm. Exam Location:  Inpatient Procedure: 2D Echo, Cardiac Doppler and Color Doppler Indications:    Congestive Heart Failure  History:        Patient has prior history of Echocardiogram examinations, most                 recent 01/02/2023. CAD, sepsis, Parkinson's, CKD,                 Signs/Symptoms:Syncope, Edema and Fatigue; Risk                 Factors:Dyslipidemia, Diabetes and Hypertension.  Sonographer:    Wallie Char Referring Phys: [id number] ROBERT DORRELL  Sonographer Comments: Technically difficult study due to poor echo windows. Image acquisition challenging due to respiratory motion. IMPRESSIONS  1. Left ventricular ejection fraction, by estimation, is 50 to 55%. The left ventricle has low  normal function. The left ventricle has no regional wall motion abnormalities. There is mild concentric left ventricular hypertrophy. Left ventricular diastolic parameters are consistent with Grade I diastolic dysfunction (impaired relaxation).  2. Right ventricular systolic function is normal. The right ventricular size is normal. There is mildly elevated pulmonary artery systolic pressure. The estimated right ventricular  systolic pressure is 36.2 mmHg.  3. Left atrial size was moderately dilated.  4. Right atrial size was mildly dilated.  5. The mitral valve is normal in structure. No evidence of mitral valve regurgitation.  6. The aortic valve is grossly normal. There is mild calcification of the aortic valve. Aortic valve regurgitation is not visualized. Aortic valve sclerosis/calcification is present, without any evidence of aortic stenosis. Comparison(s): No significant change from prior study. Prior images reviewed side by side. FINDINGS  Left Ventricle: Left ventricular ejection fraction, by estimation, is 50 to 55%. The left ventricle has low normal function. The left ventricle has no regional wall motion abnormalities. The left ventricular internal cavity size was normal in size. There is mild concentric left ventricular hypertrophy. Left ventricular diastolic parameters are consistent with Grade I diastolic dysfunction (impaired relaxation). Normal left ventricular filling pressure. Right Ventricle: The right ventricular size is normal. No increase in right ventricular wall thickness. Right ventricular systolic function is normal. There is mildly elevated pulmonary artery systolic pressure. The tricuspid regurgitant velocity is 2.88  m/s, and with an assumed right atrial pressure of 3 mmHg, the estimated right ventricular systolic pressure is 36.2 mmHg. Left Atrium: Left atrial size was moderately dilated. Right Atrium: Right atrial size was mildly dilated. Pericardium: There is no evidence of pericardial effusion. Mitral Valve: The mitral valve is normal in structure. No evidence of mitral valve regurgitation. MV peak gradient, 3.5 mmHg. The mean mitral valve gradient is 1.5 mmHg. Tricuspid Valve: The tricuspid valve is normal in structure. Tricuspid valve regurgitation is trivial. Aortic Valve: The aortic valve is grossly normal. There is mild calcification of the aortic valve. Aortic valve regurgitation is not  visualized. Aortic valve sclerosis/calcification is present, without any evidence of aortic stenosis. Aortic valve mean gradient measures 7.3 mmHg. Aortic valve peak gradient measures 11.7 mmHg. Aortic valve area, by VTI measures 2.94 cm. Pulmonic Valve: The pulmonic valve was not well visualized. Pulmonic valve regurgitation is not visualized. Aorta: The aortic root is normal in size and structure. IAS/Shunts: The interatrial septum was not well visualized.  LEFT VENTRICLE PLAX 2D LVIDd:         4.30 cm      Diastology LVIDs:         3.30 cm      LV e' medial:    6.26 cm/s LV PW:         1.30 cm      LV E/e' medial:  10.6 LV IVS:        1.30 cm      LV e' lateral:   11.75 cm/s LVOT diam:     2.30 cm      LV E/e' lateral: 5.7 LV SV:         113 LV SV Index:   53 LVOT Area:     4.15 cm  LV Volumes (MOD) LV vol d, MOD A2C: 132.0 ml LV vol d, MOD A4C: 130.0 ml LV vol s, MOD A2C: 66.2 ml LV vol s, MOD A4C: 67.5 ml LV SV MOD A2C:     65.8 ml LV SV MOD A4C:     130.0  ml LV SV MOD BP:      69.0 ml RIGHT VENTRICLE             IVC RV S prime:     11.50 cm/s  IVC diam: 1.90 cm TAPSE (M-mode): 2.0 cm LEFT ATRIUM             Index        RIGHT ATRIUM           Index LA diam:        4.90 cm 2.31 cm/m   RA Area:     22.90 cm LA Vol (A2C):   60.1 ml 28.30 ml/m  RA Volume:   77.10 ml  36.31 ml/m LA Vol (A4C):   54.8 ml 25.81 ml/m LA Biplane Vol: 57.5 ml 27.08 ml/m  AORTIC VALVE AV Area (Vmax):    3.23 cm AV Area (Vmean):   3.16 cm AV Area (VTI):     2.94 cm AV Vmax:           171.00 cm/s AV Vmean:          127.667 cm/s AV VTI:            0.385 m AV Peak Grad:      11.7 mmHg AV Mean Grad:      7.3 mmHg LVOT Vmax:         133.00 cm/s LVOT Vmean:        97.233 cm/s LVOT VTI:          0.272 m LVOT/AV VTI ratio: 0.71  AORTA Ao Root diam: 3.50 cm Ao Asc diam:  3.50 cm MITRAL VALVE               TRICUSPID VALVE MV Area (PHT): 3.31 cm    TR Peak grad:   33.2 mmHg MV Area VTI:   4.18 cm    TR Vmax:        288.00 cm/s MV Peak  grad:  3.5 mmHg MV Mean grad:  1.5 mmHg    SHUNTS MV Vmax:       0.93 m/s    Systemic VTI:  0.27 m MV Vmean:      62.7 cm/s   Systemic Diam: 2.30 cm MV Decel Time: 229 msec MV E velocity: 66.65 cm/s MV A velocity: 91.90 cm/s MV E/A ratio:  0.73 Mihai Croitoru MD Electronically signed by Thurmon Fair MD Signature Date/Time: 04/02/2023/12:22:14 PM    Final    CT Head Wo Contrast  Result Date: 04/01/2023 CLINICAL DATA:  ams EXAM: CT HEAD WITHOUT CONTRAST TECHNIQUE: Contiguous axial images were obtained from the base of the skull through the vertex without intravenous contrast. RADIATION DOSE REDUCTION: This exam was performed according to the departmental dose-optimization program which includes automated exposure control, adjustment of the mA and/or kV according to patient size and/or use of iterative reconstruction technique. COMPARISON:  CT head Mar 01, 2022. FINDINGS: Brain: No evidence of acute infarction, hemorrhage, hydrocephalus, extra-axial collection or mass lesion/mass effect. Cerebral atrophy. Vascular: No hyperdense vessel identified. Skull: No acute fracture. Sinuses/Orbits: Clear sinuses.  No acute orbital findings. Other: No mastoid effusions. IMPRESSION: No evidence of acute intracranial abnormality. Electronically Signed   By: Feliberto Harts M.D.   On: 04/01/2023 17:38   DG Chest Port 1 View  Result Date: 04/01/2023 CLINICAL DATA:  Altered mental status EXAM: PORTABLE CHEST 1 VIEW COMPARISON:  Chest radiograph dated 07/26/2022 FINDINGS: Normal lung volumes. No focal consolidations. No pleural effusion or pneumothorax. Enlarged  cardiomediastinal silhouette is likely projectional. No acute osseous abnormality. IMPRESSION: No active disease. Electronically Signed   By: Agustin Cree M.D.   On: 04/01/2023 17:07     Time coordinating discharge: Over 30 minutes    Lewie Chamber, MD  [facility] 04/04/2023, 4:27 PM

## 2023-04-04 NOTE — Progress Notes (Signed)
Physical Therapy Treatment Patient Details [name] MRN: [id number] DOB: [birth year] Today's Date: 04/04/2023   History of Present Illness Patient is a 77 year old male who presented on 6/17 with increased confusion and elevated glucose levels. Patient was admitted with complicated UTI.  PMH: UTI, parkinsons,T12 spinous process fx, L1 ant-middle colum fx and sacral fx.    PT Comments    General Comments: AxO x 3 required MAX encouragement to participate.  Max c/o poor sleep.  Appears "depressed" and "ununterested".  He wants to "go home". Assisted OOB to amb in hallway.  General bed mobility comments: Max Assist only because pt was resistant to get OOB.  Daughter and Spouse in room attempting to encourage as well.  General transfer comment: VC's for upright posture and hand placement.  Uncontrolled stand to sit in recliner.  Mild rigidity.  General Gait Details: Typical Parkinsons gait of short shufflted steps and tendency to push walker too far front.  Daughter assisted by following with recliner. Pt plans to return home with family support.  Pt "hopes" that will be TODAY so he can get a full nights sleep in his own bed.  Recommendations for follow up therapy are one component of a multi-disciplinary discharge planning process, led by the attending physician.  Recommendations may be updated based on patient status, additional functional criteria and insurance authorization.  Follow Up Recommendations       Assistance Recommended at Discharge Frequent or constant Supervision/Assistance  Patient can return home with the following A lot of help with walking and/or transfers;A lot of help with bathing/dressing/bathroom;Assistance with cooking/housework;Assist for transportation;Help with stairs or ramp for entrance   Equipment Recommendations  None recommended by PT    Recommendations for Other Services       Precautions / Restrictions Precautions Precautions: Fall Precaution  Comments: Hx Parkinson's Restrictions Weight Bearing Restrictions: No Other Position/Activity Restrictions: contact precautions     Mobility  Bed Mobility Overal bed mobility: Needs Assistance Bed Mobility: Supine to Sit     Supine to sit: Max assist     General bed mobility comments: Max Assist only because pt was resistant to get OOB.  Daughter and Spouse in room attempting to encourage as well.    Transfers Overall transfer level: Needs assistance Equipment used: Rolling walker (2 wheels) Transfers: Sit to/from Stand Sit to Stand: Min assist, +2 safety/equipment           General transfer comment: VC's for upright posture and hand placement.  Uncontrolled stand to sit in recliner.  Mild rigidity.    Ambulation/Gait Ambulation/Gait assistance: Min assist, +2 safety/equipment Gait Distance (Feet): 75 Feet Assistive device: Rolling walker (2 wheels) Gait Pattern/deviations: Step-to pattern, Shuffle, Staggering right, Wide base of support Gait velocity: decreased     General Gait Details: Typical Parkinsons gait of short shufflted steps and tendency to push walker too far front.  Daughter assisted by following with recliner.   Stairs             Wheelchair Mobility    Modified Rankin (Stroke Patients Only)       Balance                                            Cognition Arousal/Alertness: Awake/alert Behavior During Therapy: Flat affect  General Comments: AxO x 3 required MAX encouragement to participate.  Max c/o poor sleep.  Appears "depressed" and "ununterested".  He wants to "go home".        Exercises      General Comments        Pertinent Vitals/Pain Pain Assessment Pain Assessment: No/denies pain    Home Living                          Prior Function            PT Goals (current goals can now be found in the care plan section) Progress towards PT  goals: Progressing toward goals    Frequency    Min 1X/week      PT Plan Current plan remains appropriate    Co-evaluation              AM-PAC PT "6 Clicks" Mobility   Outcome Measure  Help needed turning from your back to your side while in a flat bed without using bedrails?: A Little Help needed moving from lying on your back to sitting on the side of a flat bed without using bedrails?: A Little Help needed moving to and from a bed to a chair (including a wheelchair)?: A Little Help needed standing up from a chair using your arms (e.g., wheelchair or bedside chair)?: A Little Help needed to walk in hospital room?: A Little Help needed climbing 3-5 steps with a railing? : A Little 6 Click Score: 18    End of Session Equipment Utilized During Treatment: Gait belt Activity Tolerance: Patient tolerated treatment well Patient left: in chair;with call bell/phone within reach;with chair alarm set;with family/visitor present;with nursing/sitter in room Nurse Communication: Mobility status (RN assisted with amb) PT Visit Diagnosis: Unsteadiness on feet (R26.81);Other abnormalities of gait and mobility (R26.89);Repeated falls (R29.6);Muscle weakness (generalized) (M62.81);Difficulty in walking, not elsewhere classified (R26.2)     Time: 1105-1130 PT Time Calculation (min) (ACUTE ONLY): 25 min  Charges:  $Gait Training: 8-22 mins $Therapeutic Activity: 8-22 mins                     Felecia Shelling  PTA Acute  Rehabilitation Services Office M-F          [phone]

## 2023-04-04 NOTE — Discharge Instructions (Signed)
Driving privileges restricted for now until follow up with neurology.  Use caution when using heavy equipment or power tools. Avoid working on ladders or at heights.

## 2023-04-04 NOTE — Telephone Encounter (Signed)
Called patients wife back and they are being released from Hospital and she would like a call back later today

## 2023-04-04 NOTE — TOC Transition Note (Signed)
Transition of Care [facility]) - CM/SW Discharge Note   Patient Details  [name] MRN: [id number] Date of Birth: [birth year]  Transition of Care [facility] CM/SW Contact:  Adrian Prows, RN Phone Number: 04/04/2023, 12:45 PM   Clinical Narrative:    D/C orders reeived; HHPT previously arranged w/ [facility]; notified Kelli at [facility] pt to d/c today; agency contact info placed in follow-up provider section of d/c instructions; no TOC needs.   Final next level of care: Home w Home Health Services Barriers to Discharge: No Barriers Identified   Patient Goals and CMS Choice      Discharge Placement                         Discharge Plan and Services Additional resources added to the After Visit Summary for                            [facility] Arranged: PT HH Agency: Other - See comment [name]) Date HH Agency Contacted: 04/04/23 Time HH Agency Contacted: 1243 Representative spoke with at [facility] Agency: Tresa Endo  Social Determinants of Health (SDOH) Interventions SDOH Screenings   Food Insecurity: No Food Insecurity (04/02/2023)  Housing: Low Risk  (04/02/2023)  Transportation Needs: No Transportation Needs (04/02/2023)  Utilities: Not At Risk (04/02/2023)  Depression (PHQ2-9): Low Risk  (05/29/2022)  Tobacco Use: Low Risk  (04/01/2023)     Readmission Risk Interventions    05/08/2022    3:02 PM 05/01/2022    2:36 PM  Readmission Risk Prevention Plan  Transportation Screening Complete Complete  PCP or Specialist Appt within 5-7 Days  Complete  PCP or Specialist Appt within 3-5 Days Complete   Home Care Screening  Complete  Medication Review (RN CM)  Complete  HRI or Home Care Consult Complete   Social Work Consult for Recovery Care Planning/Counseling Complete   Palliative Care Screening Not Applicable   Medication Review Oceanographer) Complete

## 2023-04-04 NOTE — Telephone Encounter (Signed)
Patient wife called and left a VM on 02-01-23  at 4:31 pm   She would like to speak to someone about what is going on with her husband. He is in the hospital

## 2023-04-05 LAB — CULTURE, BLOOD (ROUTINE X 2)

## 2023-04-06 LAB — CULTURE, BLOOD (ROUTINE X 2)
Culture: NO GROWTH
Culture: NO GROWTH

## 2023-04-10 DIAGNOSIS — I872 Venous insufficiency (chronic) (peripheral): Secondary | ICD-10-CM | POA: Diagnosis not present

## 2023-04-10 DIAGNOSIS — N179 Acute kidney failure, unspecified: Secondary | ICD-10-CM | POA: Diagnosis not present

## 2023-04-10 DIAGNOSIS — N201 Calculus of ureter: Secondary | ICD-10-CM | POA: Diagnosis not present

## 2023-04-10 DIAGNOSIS — G2581 Restless legs syndrome: Secondary | ICD-10-CM | POA: Diagnosis not present

## 2023-04-10 DIAGNOSIS — I2510 Atherosclerotic heart disease of native coronary artery without angina pectoris: Secondary | ICD-10-CM | POA: Diagnosis not present

## 2023-04-10 DIAGNOSIS — I5041 Acute combined systolic (congestive) and diastolic (congestive) heart failure: Secondary | ICD-10-CM | POA: Diagnosis not present

## 2023-04-10 DIAGNOSIS — E1151 Type 2 diabetes mellitus with diabetic peripheral angiopathy without gangrene: Secondary | ICD-10-CM | POA: Diagnosis not present

## 2023-04-10 DIAGNOSIS — G20B1 Parkinson's disease with dyskinesia, without mention of fluctuations: Secondary | ICD-10-CM | POA: Diagnosis not present

## 2023-04-10 DIAGNOSIS — I444 Left anterior fascicular block: Secondary | ICD-10-CM | POA: Diagnosis not present

## 2023-04-10 DIAGNOSIS — I130 Hypertensive heart and chronic kidney disease with heart failure and stage 1 through stage 4 chronic kidney disease, or unspecified chronic kidney disease: Secondary | ICD-10-CM | POA: Diagnosis not present

## 2023-04-10 DIAGNOSIS — M1990 Unspecified osteoarthritis, unspecified site: Secondary | ICD-10-CM | POA: Diagnosis not present

## 2023-04-10 DIAGNOSIS — J309 Allergic rhinitis, unspecified: Secondary | ICD-10-CM | POA: Diagnosis not present

## 2023-04-10 DIAGNOSIS — N3000 Acute cystitis without hematuria: Secondary | ICD-10-CM | POA: Diagnosis not present

## 2023-04-10 DIAGNOSIS — N183 Chronic kidney disease, stage 3 unspecified: Secondary | ICD-10-CM | POA: Diagnosis not present

## 2023-04-10 DIAGNOSIS — K219 Gastro-esophageal reflux disease without esophagitis: Secondary | ICD-10-CM | POA: Diagnosis not present

## 2023-04-10 DIAGNOSIS — R1313 Dysphagia, pharyngeal phase: Secondary | ICD-10-CM | POA: Diagnosis not present

## 2023-04-10 DIAGNOSIS — M810 Age-related osteoporosis without current pathological fracture: Secondary | ICD-10-CM | POA: Diagnosis not present

## 2023-04-10 DIAGNOSIS — E1122 Type 2 diabetes mellitus with diabetic chronic kidney disease: Secondary | ICD-10-CM | POA: Diagnosis not present

## 2023-04-10 DIAGNOSIS — K5900 Constipation, unspecified: Secondary | ICD-10-CM | POA: Diagnosis not present

## 2023-04-10 DIAGNOSIS — E1165 Type 2 diabetes mellitus with hyperglycemia: Secondary | ICD-10-CM | POA: Diagnosis not present

## 2023-04-10 DIAGNOSIS — D72829 Elevated white blood cell count, unspecified: Secondary | ICD-10-CM | POA: Diagnosis not present

## 2023-04-11 ENCOUNTER — Ambulatory Visit (HOSPITAL_COMMUNITY): Payer: Medicare Other

## 2023-04-15 DIAGNOSIS — N201 Calculus of ureter: Secondary | ICD-10-CM | POA: Diagnosis not present

## 2023-04-15 DIAGNOSIS — E1165 Type 2 diabetes mellitus with hyperglycemia: Secondary | ICD-10-CM | POA: Diagnosis not present

## 2023-04-15 DIAGNOSIS — E1122 Type 2 diabetes mellitus with diabetic chronic kidney disease: Secondary | ICD-10-CM | POA: Diagnosis not present

## 2023-04-15 DIAGNOSIS — M810 Age-related osteoporosis without current pathological fracture: Secondary | ICD-10-CM | POA: Diagnosis not present

## 2023-04-15 DIAGNOSIS — G20B1 Parkinson's disease with dyskinesia, without mention of fluctuations: Secondary | ICD-10-CM | POA: Diagnosis not present

## 2023-04-15 DIAGNOSIS — I444 Left anterior fascicular block: Secondary | ICD-10-CM | POA: Diagnosis not present

## 2023-04-15 DIAGNOSIS — D72829 Elevated white blood cell count, unspecified: Secondary | ICD-10-CM | POA: Diagnosis not present

## 2023-04-15 DIAGNOSIS — N3000 Acute cystitis without hematuria: Secondary | ICD-10-CM | POA: Diagnosis not present

## 2023-04-15 DIAGNOSIS — I872 Venous insufficiency (chronic) (peripheral): Secondary | ICD-10-CM | POA: Diagnosis not present

## 2023-04-15 DIAGNOSIS — I2510 Atherosclerotic heart disease of native coronary artery without angina pectoris: Secondary | ICD-10-CM | POA: Diagnosis not present

## 2023-04-15 DIAGNOSIS — N183 Chronic kidney disease, stage 3 unspecified: Secondary | ICD-10-CM | POA: Diagnosis not present

## 2023-04-15 DIAGNOSIS — G2581 Restless legs syndrome: Secondary | ICD-10-CM | POA: Diagnosis not present

## 2023-04-15 DIAGNOSIS — I5041 Acute combined systolic (congestive) and diastolic (congestive) heart failure: Secondary | ICD-10-CM | POA: Diagnosis not present

## 2023-04-15 DIAGNOSIS — K219 Gastro-esophageal reflux disease without esophagitis: Secondary | ICD-10-CM | POA: Diagnosis not present

## 2023-04-15 DIAGNOSIS — N179 Acute kidney failure, unspecified: Secondary | ICD-10-CM | POA: Diagnosis not present

## 2023-04-15 DIAGNOSIS — M1990 Unspecified osteoarthritis, unspecified site: Secondary | ICD-10-CM | POA: Diagnosis not present

## 2023-04-15 DIAGNOSIS — R1313 Dysphagia, pharyngeal phase: Secondary | ICD-10-CM | POA: Diagnosis not present

## 2023-04-15 DIAGNOSIS — K5900 Constipation, unspecified: Secondary | ICD-10-CM | POA: Diagnosis not present

## 2023-04-15 DIAGNOSIS — I130 Hypertensive heart and chronic kidney disease with heart failure and stage 1 through stage 4 chronic kidney disease, or unspecified chronic kidney disease: Secondary | ICD-10-CM | POA: Diagnosis not present

## 2023-04-15 DIAGNOSIS — E1151 Type 2 diabetes mellitus with diabetic peripheral angiopathy without gangrene: Secondary | ICD-10-CM | POA: Diagnosis not present

## 2023-04-15 DIAGNOSIS — J309 Allergic rhinitis, unspecified: Secondary | ICD-10-CM | POA: Diagnosis not present

## 2023-04-16 ENCOUNTER — Other Ambulatory Visit: Payer: Self-pay

## 2023-04-16 ENCOUNTER — Other Ambulatory Visit (HOSPITAL_COMMUNITY): Payer: Self-pay

## 2023-04-16 ENCOUNTER — Other Ambulatory Visit: Payer: Self-pay | Admitting: Neurology

## 2023-04-16 MED ORDER — ROSUVASTATIN CALCIUM 40 MG PO TABS: 40.0000 mg | ORAL_TABLET | Freq: Every day | ORAL | 2 refills | Status: DC

## 2023-04-17 DIAGNOSIS — I872 Venous insufficiency (chronic) (peripheral): Secondary | ICD-10-CM | POA: Diagnosis not present

## 2023-04-17 DIAGNOSIS — N201 Calculus of ureter: Secondary | ICD-10-CM | POA: Diagnosis not present

## 2023-04-17 DIAGNOSIS — E1165 Type 2 diabetes mellitus with hyperglycemia: Secondary | ICD-10-CM | POA: Diagnosis not present

## 2023-04-17 DIAGNOSIS — J309 Allergic rhinitis, unspecified: Secondary | ICD-10-CM | POA: Diagnosis not present

## 2023-04-17 DIAGNOSIS — D72829 Elevated white blood cell count, unspecified: Secondary | ICD-10-CM | POA: Diagnosis not present

## 2023-04-17 DIAGNOSIS — M810 Age-related osteoporosis without current pathological fracture: Secondary | ICD-10-CM | POA: Diagnosis not present

## 2023-04-17 DIAGNOSIS — M1990 Unspecified osteoarthritis, unspecified site: Secondary | ICD-10-CM | POA: Diagnosis not present

## 2023-04-17 DIAGNOSIS — R1313 Dysphagia, pharyngeal phase: Secondary | ICD-10-CM | POA: Diagnosis not present

## 2023-04-17 DIAGNOSIS — G20B1 Parkinson's disease with dyskinesia, without mention of fluctuations: Secondary | ICD-10-CM | POA: Diagnosis not present

## 2023-04-17 DIAGNOSIS — N183 Chronic kidney disease, stage 3 unspecified: Secondary | ICD-10-CM | POA: Diagnosis not present

## 2023-04-17 DIAGNOSIS — I444 Left anterior fascicular block: Secondary | ICD-10-CM | POA: Diagnosis not present

## 2023-04-17 DIAGNOSIS — K219 Gastro-esophageal reflux disease without esophagitis: Secondary | ICD-10-CM | POA: Diagnosis not present

## 2023-04-17 DIAGNOSIS — E1122 Type 2 diabetes mellitus with diabetic chronic kidney disease: Secondary | ICD-10-CM | POA: Diagnosis not present

## 2023-04-17 DIAGNOSIS — I2510 Atherosclerotic heart disease of native coronary artery without angina pectoris: Secondary | ICD-10-CM | POA: Diagnosis not present

## 2023-04-17 DIAGNOSIS — N179 Acute kidney failure, unspecified: Secondary | ICD-10-CM | POA: Diagnosis not present

## 2023-04-17 DIAGNOSIS — E1151 Type 2 diabetes mellitus with diabetic peripheral angiopathy without gangrene: Secondary | ICD-10-CM | POA: Diagnosis not present

## 2023-04-17 DIAGNOSIS — I130 Hypertensive heart and chronic kidney disease with heart failure and stage 1 through stage 4 chronic kidney disease, or unspecified chronic kidney disease: Secondary | ICD-10-CM | POA: Diagnosis not present

## 2023-04-17 DIAGNOSIS — K5900 Constipation, unspecified: Secondary | ICD-10-CM | POA: Diagnosis not present

## 2023-04-17 DIAGNOSIS — I5041 Acute combined systolic (congestive) and diastolic (congestive) heart failure: Secondary | ICD-10-CM | POA: Diagnosis not present

## 2023-04-17 DIAGNOSIS — N3000 Acute cystitis without hematuria: Secondary | ICD-10-CM | POA: Diagnosis not present

## 2023-04-17 DIAGNOSIS — G2581 Restless legs syndrome: Secondary | ICD-10-CM | POA: Diagnosis not present

## 2023-04-22 DIAGNOSIS — E1122 Type 2 diabetes mellitus with diabetic chronic kidney disease: Secondary | ICD-10-CM | POA: Diagnosis not present

## 2023-04-22 DIAGNOSIS — M4607 Spinal enthesopathy, lumbosacral region: Secondary | ICD-10-CM | POA: Diagnosis not present

## 2023-04-22 DIAGNOSIS — E1165 Type 2 diabetes mellitus with hyperglycemia: Secondary | ICD-10-CM | POA: Diagnosis not present

## 2023-04-22 DIAGNOSIS — G20C Parkinsonism, unspecified: Secondary | ICD-10-CM | POA: Diagnosis not present

## 2023-04-22 DIAGNOSIS — Z794 Long term (current) use of insulin: Secondary | ICD-10-CM | POA: Diagnosis not present

## 2023-04-23 DIAGNOSIS — E1165 Type 2 diabetes mellitus with hyperglycemia: Secondary | ICD-10-CM | POA: Diagnosis not present

## 2023-04-23 DIAGNOSIS — I872 Venous insufficiency (chronic) (peripheral): Secondary | ICD-10-CM | POA: Diagnosis not present

## 2023-04-23 DIAGNOSIS — N201 Calculus of ureter: Secondary | ICD-10-CM | POA: Diagnosis not present

## 2023-04-23 DIAGNOSIS — I5041 Acute combined systolic (congestive) and diastolic (congestive) heart failure: Secondary | ICD-10-CM | POA: Diagnosis not present

## 2023-04-23 DIAGNOSIS — K5900 Constipation, unspecified: Secondary | ICD-10-CM | POA: Diagnosis not present

## 2023-04-23 DIAGNOSIS — N3000 Acute cystitis without hematuria: Secondary | ICD-10-CM | POA: Diagnosis not present

## 2023-04-23 DIAGNOSIS — G20B1 Parkinson's disease with dyskinesia, without mention of fluctuations: Secondary | ICD-10-CM | POA: Diagnosis not present

## 2023-04-23 DIAGNOSIS — I130 Hypertensive heart and chronic kidney disease with heart failure and stage 1 through stage 4 chronic kidney disease, or unspecified chronic kidney disease: Secondary | ICD-10-CM | POA: Diagnosis not present

## 2023-04-23 DIAGNOSIS — N183 Chronic kidney disease, stage 3 unspecified: Secondary | ICD-10-CM | POA: Diagnosis not present

## 2023-04-23 DIAGNOSIS — I444 Left anterior fascicular block: Secondary | ICD-10-CM | POA: Diagnosis not present

## 2023-04-23 DIAGNOSIS — R1313 Dysphagia, pharyngeal phase: Secondary | ICD-10-CM | POA: Diagnosis not present

## 2023-04-23 DIAGNOSIS — N179 Acute kidney failure, unspecified: Secondary | ICD-10-CM | POA: Diagnosis not present

## 2023-04-23 DIAGNOSIS — D72829 Elevated white blood cell count, unspecified: Secondary | ICD-10-CM | POA: Diagnosis not present

## 2023-04-23 DIAGNOSIS — M810 Age-related osteoporosis without current pathological fracture: Secondary | ICD-10-CM | POA: Diagnosis not present

## 2023-04-23 DIAGNOSIS — E1151 Type 2 diabetes mellitus with diabetic peripheral angiopathy without gangrene: Secondary | ICD-10-CM | POA: Diagnosis not present

## 2023-04-23 DIAGNOSIS — I2510 Atherosclerotic heart disease of native coronary artery without angina pectoris: Secondary | ICD-10-CM | POA: Diagnosis not present

## 2023-04-23 DIAGNOSIS — M1990 Unspecified osteoarthritis, unspecified site: Secondary | ICD-10-CM | POA: Diagnosis not present

## 2023-04-23 DIAGNOSIS — J309 Allergic rhinitis, unspecified: Secondary | ICD-10-CM | POA: Diagnosis not present

## 2023-04-23 DIAGNOSIS — G2581 Restless legs syndrome: Secondary | ICD-10-CM | POA: Diagnosis not present

## 2023-04-23 DIAGNOSIS — K219 Gastro-esophageal reflux disease without esophagitis: Secondary | ICD-10-CM | POA: Diagnosis not present

## 2023-04-23 DIAGNOSIS — E1122 Type 2 diabetes mellitus with diabetic chronic kidney disease: Secondary | ICD-10-CM | POA: Diagnosis not present

## 2023-04-24 DIAGNOSIS — J309 Allergic rhinitis, unspecified: Secondary | ICD-10-CM | POA: Diagnosis not present

## 2023-04-24 DIAGNOSIS — I2510 Atherosclerotic heart disease of native coronary artery without angina pectoris: Secondary | ICD-10-CM | POA: Diagnosis not present

## 2023-04-24 DIAGNOSIS — M810 Age-related osteoporosis without current pathological fracture: Secondary | ICD-10-CM | POA: Diagnosis not present

## 2023-04-24 DIAGNOSIS — K219 Gastro-esophageal reflux disease without esophagitis: Secondary | ICD-10-CM | POA: Diagnosis not present

## 2023-04-24 DIAGNOSIS — K5900 Constipation, unspecified: Secondary | ICD-10-CM | POA: Diagnosis not present

## 2023-04-24 DIAGNOSIS — I444 Left anterior fascicular block: Secondary | ICD-10-CM | POA: Diagnosis not present

## 2023-04-24 DIAGNOSIS — E1151 Type 2 diabetes mellitus with diabetic peripheral angiopathy without gangrene: Secondary | ICD-10-CM | POA: Diagnosis not present

## 2023-04-24 DIAGNOSIS — N3000 Acute cystitis without hematuria: Secondary | ICD-10-CM | POA: Diagnosis not present

## 2023-04-24 DIAGNOSIS — E1165 Type 2 diabetes mellitus with hyperglycemia: Secondary | ICD-10-CM | POA: Diagnosis not present

## 2023-04-24 DIAGNOSIS — N179 Acute kidney failure, unspecified: Secondary | ICD-10-CM | POA: Diagnosis not present

## 2023-04-24 DIAGNOSIS — R1313 Dysphagia, pharyngeal phase: Secondary | ICD-10-CM | POA: Diagnosis not present

## 2023-04-24 DIAGNOSIS — I130 Hypertensive heart and chronic kidney disease with heart failure and stage 1 through stage 4 chronic kidney disease, or unspecified chronic kidney disease: Secondary | ICD-10-CM | POA: Diagnosis not present

## 2023-04-24 DIAGNOSIS — E1122 Type 2 diabetes mellitus with diabetic chronic kidney disease: Secondary | ICD-10-CM | POA: Diagnosis not present

## 2023-04-24 DIAGNOSIS — D72829 Elevated white blood cell count, unspecified: Secondary | ICD-10-CM | POA: Diagnosis not present

## 2023-04-24 DIAGNOSIS — I872 Venous insufficiency (chronic) (peripheral): Secondary | ICD-10-CM | POA: Diagnosis not present

## 2023-04-24 DIAGNOSIS — N183 Chronic kidney disease, stage 3 unspecified: Secondary | ICD-10-CM | POA: Diagnosis not present

## 2023-04-24 DIAGNOSIS — M1990 Unspecified osteoarthritis, unspecified site: Secondary | ICD-10-CM | POA: Diagnosis not present

## 2023-04-24 DIAGNOSIS — G2581 Restless legs syndrome: Secondary | ICD-10-CM | POA: Diagnosis not present

## 2023-04-24 DIAGNOSIS — I5041 Acute combined systolic (congestive) and diastolic (congestive) heart failure: Secondary | ICD-10-CM | POA: Diagnosis not present

## 2023-04-24 DIAGNOSIS — N201 Calculus of ureter: Secondary | ICD-10-CM | POA: Diagnosis not present

## 2023-04-24 DIAGNOSIS — G20B1 Parkinson's disease with dyskinesia, without mention of fluctuations: Secondary | ICD-10-CM | POA: Diagnosis not present

## 2023-04-25 DIAGNOSIS — I5041 Acute combined systolic (congestive) and diastolic (congestive) heart failure: Secondary | ICD-10-CM | POA: Diagnosis not present

## 2023-04-25 DIAGNOSIS — D72829 Elevated white blood cell count, unspecified: Secondary | ICD-10-CM | POA: Diagnosis not present

## 2023-04-25 DIAGNOSIS — R1313 Dysphagia, pharyngeal phase: Secondary | ICD-10-CM | POA: Diagnosis not present

## 2023-04-25 DIAGNOSIS — E1122 Type 2 diabetes mellitus with diabetic chronic kidney disease: Secondary | ICD-10-CM | POA: Diagnosis not present

## 2023-04-25 DIAGNOSIS — G2581 Restless legs syndrome: Secondary | ICD-10-CM | POA: Diagnosis not present

## 2023-04-25 DIAGNOSIS — N201 Calculus of ureter: Secondary | ICD-10-CM | POA: Diagnosis not present

## 2023-04-25 DIAGNOSIS — M1990 Unspecified osteoarthritis, unspecified site: Secondary | ICD-10-CM | POA: Diagnosis not present

## 2023-04-25 DIAGNOSIS — I444 Left anterior fascicular block: Secondary | ICD-10-CM | POA: Diagnosis not present

## 2023-04-25 DIAGNOSIS — I130 Hypertensive heart and chronic kidney disease with heart failure and stage 1 through stage 4 chronic kidney disease, or unspecified chronic kidney disease: Secondary | ICD-10-CM | POA: Diagnosis not present

## 2023-04-25 DIAGNOSIS — K5900 Constipation, unspecified: Secondary | ICD-10-CM | POA: Diagnosis not present

## 2023-04-25 DIAGNOSIS — K219 Gastro-esophageal reflux disease without esophagitis: Secondary | ICD-10-CM | POA: Diagnosis not present

## 2023-04-25 DIAGNOSIS — E1151 Type 2 diabetes mellitus with diabetic peripheral angiopathy without gangrene: Secondary | ICD-10-CM | POA: Diagnosis not present

## 2023-04-25 DIAGNOSIS — N3000 Acute cystitis without hematuria: Secondary | ICD-10-CM | POA: Diagnosis not present

## 2023-04-25 DIAGNOSIS — J309 Allergic rhinitis, unspecified: Secondary | ICD-10-CM | POA: Diagnosis not present

## 2023-04-25 DIAGNOSIS — I872 Venous insufficiency (chronic) (peripheral): Secondary | ICD-10-CM | POA: Diagnosis not present

## 2023-04-25 DIAGNOSIS — I2510 Atherosclerotic heart disease of native coronary artery without angina pectoris: Secondary | ICD-10-CM | POA: Diagnosis not present

## 2023-04-25 DIAGNOSIS — M810 Age-related osteoporosis without current pathological fracture: Secondary | ICD-10-CM | POA: Diagnosis not present

## 2023-04-25 DIAGNOSIS — N183 Chronic kidney disease, stage 3 unspecified: Secondary | ICD-10-CM | POA: Diagnosis not present

## 2023-04-25 DIAGNOSIS — E1165 Type 2 diabetes mellitus with hyperglycemia: Secondary | ICD-10-CM | POA: Diagnosis not present

## 2023-04-25 DIAGNOSIS — N179 Acute kidney failure, unspecified: Secondary | ICD-10-CM | POA: Diagnosis not present

## 2023-04-25 DIAGNOSIS — G20B1 Parkinson's disease with dyskinesia, without mention of fluctuations: Secondary | ICD-10-CM | POA: Diagnosis not present

## 2023-04-29 DIAGNOSIS — E1165 Type 2 diabetes mellitus with hyperglycemia: Secondary | ICD-10-CM | POA: Diagnosis not present

## 2023-04-29 DIAGNOSIS — I5041 Acute combined systolic (congestive) and diastolic (congestive) heart failure: Secondary | ICD-10-CM | POA: Diagnosis not present

## 2023-04-29 DIAGNOSIS — I2510 Atherosclerotic heart disease of native coronary artery without angina pectoris: Secondary | ICD-10-CM | POA: Diagnosis not present

## 2023-04-30 DIAGNOSIS — E1122 Type 2 diabetes mellitus with diabetic chronic kidney disease: Secondary | ICD-10-CM | POA: Diagnosis not present

## 2023-04-30 DIAGNOSIS — N3000 Acute cystitis without hematuria: Secondary | ICD-10-CM | POA: Diagnosis not present

## 2023-04-30 DIAGNOSIS — E1151 Type 2 diabetes mellitus with diabetic peripheral angiopathy without gangrene: Secondary | ICD-10-CM | POA: Diagnosis not present

## 2023-04-30 DIAGNOSIS — K219 Gastro-esophageal reflux disease without esophagitis: Secondary | ICD-10-CM | POA: Diagnosis not present

## 2023-04-30 DIAGNOSIS — J309 Allergic rhinitis, unspecified: Secondary | ICD-10-CM | POA: Diagnosis not present

## 2023-04-30 DIAGNOSIS — I444 Left anterior fascicular block: Secondary | ICD-10-CM | POA: Diagnosis not present

## 2023-04-30 DIAGNOSIS — G2581 Restless legs syndrome: Secondary | ICD-10-CM | POA: Diagnosis not present

## 2023-04-30 DIAGNOSIS — M1990 Unspecified osteoarthritis, unspecified site: Secondary | ICD-10-CM | POA: Diagnosis not present

## 2023-04-30 DIAGNOSIS — G20B1 Parkinson's disease with dyskinesia, without mention of fluctuations: Secondary | ICD-10-CM | POA: Diagnosis not present

## 2023-04-30 DIAGNOSIS — N201 Calculus of ureter: Secondary | ICD-10-CM | POA: Diagnosis not present

## 2023-04-30 DIAGNOSIS — N183 Chronic kidney disease, stage 3 unspecified: Secondary | ICD-10-CM | POA: Diagnosis not present

## 2023-04-30 DIAGNOSIS — E1165 Type 2 diabetes mellitus with hyperglycemia: Secondary | ICD-10-CM | POA: Diagnosis not present

## 2023-04-30 DIAGNOSIS — I872 Venous insufficiency (chronic) (peripheral): Secondary | ICD-10-CM | POA: Diagnosis not present

## 2023-04-30 DIAGNOSIS — D72829 Elevated white blood cell count, unspecified: Secondary | ICD-10-CM | POA: Diagnosis not present

## 2023-04-30 DIAGNOSIS — R1313 Dysphagia, pharyngeal phase: Secondary | ICD-10-CM | POA: Diagnosis not present

## 2023-04-30 DIAGNOSIS — N179 Acute kidney failure, unspecified: Secondary | ICD-10-CM | POA: Diagnosis not present

## 2023-04-30 DIAGNOSIS — I5041 Acute combined systolic (congestive) and diastolic (congestive) heart failure: Secondary | ICD-10-CM | POA: Diagnosis not present

## 2023-04-30 DIAGNOSIS — K5900 Constipation, unspecified: Secondary | ICD-10-CM | POA: Diagnosis not present

## 2023-04-30 DIAGNOSIS — M810 Age-related osteoporosis without current pathological fracture: Secondary | ICD-10-CM | POA: Diagnosis not present

## 2023-04-30 DIAGNOSIS — I130 Hypertensive heart and chronic kidney disease with heart failure and stage 1 through stage 4 chronic kidney disease, or unspecified chronic kidney disease: Secondary | ICD-10-CM | POA: Diagnosis not present

## 2023-04-30 DIAGNOSIS — I2510 Atherosclerotic heart disease of native coronary artery without angina pectoris: Secondary | ICD-10-CM | POA: Diagnosis not present

## 2023-05-01 NOTE — Progress Notes (Signed)
Assessment/Plan:   1.  Parkinsons Disease, diagnosed 2010 with symptoms to 2008  -continue carbidopa/levodopa 25/100, 3 po 6 x per day.  Discussed with them that he certainly can spread it out differently than he currently is, but I do not want him increasing the dose.  I do not think he needs anymore.  He certainly looks well treated.  There is no evidence of off and I have never seen him off.  Discussed with him that he does have freezing, but that is generally not levodopa responsive and he needs to use his walker, which he declines to use outside of the home.  -Continue entacapone 200 mg with first three dosages of levodopa   -For now, we will continue ropinirole 3 mg, 1/1/0.5  -Wife asks about other medications, specifically Gocovri.  Discussed that he does not need this medication, but I would also be concerned about increased risk for confusion.  He also was not bothered by dyskinesia particularly  -his main issues with Parkinsons Disease have been when he is hospitalized with UTI.  I talked to them about this again today.  Parkinsons Disease is merely "reacting" to the environment it is given when he is septic.  In addition, lack of use of a walker outside of the home and lack of a coordinated exercise program have been problematic.    -Patient and I discussed that Parkinsons Disease causes decreased reflex/reaction time and driving isn't recommend unless he does an OT driving evaluation.  He can have that done through [place] or [employer].  Information is given.  2.  History of UTI worsening parkinsonism  -Following with infectious disease closely  -follows with urology for nephrolithiasis  3.  Day/night reversal  -Long discussion again with patient regarding sleep hygiene.  Discussed with him that melatonin can help him to get back on a regular cycle, but that he really needs to work on staying active during the day, with a regular schedule.  Discussed that day/night  reversal really can lead to hallucinations, which ultimately can lead to discontinuation of ropinirole and subsequent worsening of motor symptoms.  4.  Severe CAD  -Cardiac cath in March, 2024 with evidence of severe dual antiplatelet therapy with aspirin/Plavix  5.   Uncontrolled DM  -hgbA1c is nearly 12!  He has just started meeting with endocrinology nurse practitioner at [facility] (works with Dr. Evlyn Kanner) Subjective:   [name] was seen today in follow up for Parkinsons disease.  My previous records were reviewed prior to todays visit as well as outside records available to me.  Patient has continued to struggle with issues outside of Parkinsons Disease that have affected his stamina.  He had a cardiac cath at the end of March demonstrating severe multivessel stenosis.  The plan was for DAPT with aspirin/Plavix for at least 6 months.  I received a call June 10 that patient was more depressed and weak and they wanted to address those things at his upcoming appt on 6/20.  Not surprisingly, as is his typical pattern, he was admitted not long thereafter with acute cystitis.  This was associated with mental status change.  His BS was also 575 and he underwent diabetes counseling in the hospital.  He is now following with endocrinology.  He had to cx his June appt because he was in the hospital.  Wife called prior to todays appt and wanted [place] to discuss no driving with him but didn't want [place] to mention  her asking about it (making it difficult to discuss).  He has been out of the hospital x 3 weeks and PT is coming out to the home.  Refuses to use walker out of the home    Current prescribed movement disorder medications: carbidopa/levodopa 25/100, 3 tablets 5 x per day Entacapone, 200 mg with first 3 dosages of levodopa  requip 3 mg, 1 tablet in the morning, 1 in the afternoon, half tablet in the evening  Inbrija (samples last visit)  PREVIOUS MEDICATIONS: neupro (helped but costly); requip;  levodopa IR; amantadine (d/c when confused)  ALLERGIES:   Allergies  Allergen Reactions   Nucynta [Tapentadol] Other (See Comments)    Made the patient not feel well. "He did not feel well at all."   Oxycodone Itching   Sudafed [Pseudoephedrine Hcl] Other (See Comments)    Problems urinating    Tramadol Hcl Itching and Other (See Comments)    Can tolerate, however (in 2023)    CURRENT MEDICATIONS:  Outpatient Encounter Medications as of 05/02/2023  Medication Sig   acetaminophen (TYLENOL) 500 MG tablet Take 500-1,000 mg by mouth every 6 (six) hours as needed (for pain).   Ascorbic Acid (VITAMIN C) 100 MG tablet Take 100 mg by mouth in the morning and at bedtime.   aspirin EC 81 MG tablet Take 81 mg by mouth daily. Swallow whole.   calcium carbonate (TUMS) 500 MG chewable tablet Chew 2 tablets by mouth daily as needed for indigestion or heartburn.   carbidopa-levodopa (SINEMET IR) 25-100 MG tablet TAKE 3 TABLETS BY MOUTH AT 7:00 AM, 3 TABS AT 10:00 AM, 3 TABS AT 12:00 PM, 3 TABS AT 2:00 PM, 3 TABS AT 4:00, AND 3 TABS AT 6:00 PM (Patient taking differently: Take 3 tablets by mouth 6 (six) times daily. TAKE 3 TABLETS BY MOUTH AT 7:00 AM, 3 TABS AT 10:00 AM, 3 TABS AT 12:00 PM, 3 TABS AT 2:00 PM, 3 TABS AT 4:00, AND 3 TABS AT 6:00 PM)   Cholecalciferol (VITAMIN D) 50 MCG (2000 UT) tablet Take 2,000 Units by mouth daily.   clopidogrel (PLAVIX) 75 MG tablet Take 1 tablet (75 mg total) by mouth daily with breakfast.   Continuous Glucose Sensor (DEXCOM G7 SENSOR) MISC 1 each by Does not apply route as needed.   entacapone (COMTAN) 200 MG tablet TAKE 1 TABLET BY MOUTH 3 TIMES DAILY (WITH FIRST THREE DOSAGES OF CARBIDOPA/LEVODOPA). (Patient taking differently: Take 200 mg by mouth 3 (three) times daily.)   ENTRESTO 24-26 MG TAKE 1 TABLET BY MOUTH 2 TIMES DAILY.   furosemide (LASIX) 20 MG tablet Take 2 tablets (40 mg total) by mouth daily.   GLOBAL EASE INJECT PEN NEEDLES 31G X 5 MM MISC Inject into  the skin.   ibuprofen (ADVIL) 200 MG tablet Take 400 mg by mouth daily.   insulin lispro (HUMALOG) 100 UNIT/ML KwikPen Inject 2-15 Units into the skin 4 (four) times daily -  before meals and at bedtime. CBG 121 - 150: 2 units, CBG 151 - 200: 3 units, CBG 201 - 250: 5 units, CBG 251 - 300: 8 units, CBG 301 - 350: 11 units, CBG 351 - 400: 15 units   LANTUS SOLOSTAR 100 UNIT/ML Solostar Pen Inject 25 Units into the skin at bedtime.   Levodopa (INBRIJA) 42 MG CAPS Samples of this drug were given to the patient, quantity 1, Lot Number [id number] exp 12-24   methenamine (HIPREX) 1 g tablet TAKE 1 TABLET BY MOUTH 2  TIMES DAILY. TAKE WITH ANY VITAMIN C, TWICE A DAY FOR UTI PREVENTION (Patient taking differently: Take 1 g by mouth 2 (two) times daily with a meal.)   metoprolol succinate (TOPROL-XL) 50 MG 24 hr tablet Take 1 tablet (50 mg total) by mouth daily. Take with or immediately following a meal.   pantoprazole (PROTONIX) 40 MG tablet Take 1 tablet (40 mg total) by mouth daily.   potassium chloride (KLOR-CON) 10 MEQ tablet Take 10 mEq by mouth 2 (two) times daily.   rOPINIRole (REQUIP) 3 MG tablet Take 3 mg by mouth at bedtime. 1 to 3 hours before bed time   rosuvastatin (CRESTOR) 40 MG tablet Take 1 tablet (40 mg total) by mouth daily.   tamsulosin (FLOMAX) 0.4 MG CAPS capsule Take 0.4 mg by mouth in the morning and at bedtime.   triamcinolone (NASACORT) 55 MCG/ACT AERO nasal inhaler Place 2 sprays into the nose daily as needed (for allergies or rhinitis).   vitamin B-12 (CYANOCOBALAMIN) 1000 MCG tablet Take 2,000 mcg by mouth daily.   [DISCONTINUED] rOPINIRole (REQUIP) 3 MG tablet TAKE 1 TABLET IN THE MORNING, TAKE 1 TABLET IN THE AFTERNOON, AND 1/2 TABLET IN THE EVENING   nitroGLYCERIN (NITROSTAT) 0.4 MG SL tablet Place 1 tablet (0.4 mg total) under the tongue every 5 (five) minutes as needed. (Patient not taking: Reported on 05/02/2023)   No facility-administered encounter medications on file as  of 05/02/2023.    Objective:   PHYSICAL EXAMINATION:    VITALS:   Vitals:   05/02/23 1457  BP: 110/78  Weight: 194 lb (88 kg)  Height: 6' (1.829 m)    Wt Readings from Last 3 Encounters:  05/02/23 194 lb (88 kg)  04/03/23 200 lb 3.2 oz (90.8 kg)  01/23/23 198 lb 6.4 oz (90 kg)    GEN:  The patient appears stated age and is in NAD. HEENT:  Normocephalic, atraumatic.  The mucous membranes are moist. The superficial temporal arteries are without ropiness or tenderness.    Neurological examination:  Orientation: The patient is alert and oriented x3. Cranial nerves: There is good facial symmetry with min facial hypomimia. The speech is fluent and clear. Soft palate rises symmetrically and there is no tongue deviation. Hearing is intact to conversational tone. Sensation: Sensation is intact to light touch throughout Motor: Strength is at least antigravity x4.  Movement examination: Tone: There is nl tone in the UE/LE. Abnormal movements: mild dyskinesia of the R leg (similar to last visit) Coordination:  There is no significant decremation with any rapid alternating movement. Gait and Station: The patient pushes off of the chair to arise.  He ambulates fairly well with his cane, but he is a bit unsteady.  He does have freezing and stutter steps.  He has trouble in the doorway.  I have reviewed and interpreted the following labs independently    Chemistry      Component Value Date/Time   NA 137 04/04/2023 0503   NA 136 01/02/2023 1442   K 4.3 04/04/2023 0503   CL 103 04/04/2023 0503   CO2 27 04/04/2023 0503   BUN 20 04/04/2023 0503   BUN 22 01/02/2023 1442   CREATININE 1.10 04/04/2023 0503   CREATININE 1.29 (H) 08/21/2022 1558      Component Value Date/Time   CALCIUM 8.3 (L) 04/04/2023 0503   ALKPHOS 105 04/01/2023 1607   AST 7 (L) 04/01/2023 1607   ALT 7 04/01/2023 1607   BILITOT 0.9 04/01/2023 1607  Lab Results  Component Value Date   WBC 9.2  04/02/2023   HGB 13.3 04/02/2023   HCT 39.9 04/02/2023   MCV 91.3 04/02/2023   PLT 207 04/02/2023    No results found for: "TSH"  Lab Results  Component Value Date   HGBA1C 11.7 (H) 04/02/2023     Total time spent on today's visit was 46 minutes, including both face-to-face time and nonface-to-face time.  Time included that spent on review of records (prior notes available to me/labs/imaging if pertinent), discussing treatment and goals, answering patient's questions and coordinating care.  Cc:  Emilio Aspen, MD

## 2023-05-02 ENCOUNTER — Ambulatory Visit: Payer: Medicare Other | Admitting: Neurology

## 2023-05-02 ENCOUNTER — Encounter: Payer: Self-pay | Admitting: Neurology

## 2023-05-02 VITALS — BP 110/78 | HR 66 | Resp 18 | Ht 72.0 in | Wt 194.0 lb

## 2023-05-02 DIAGNOSIS — E119 Type 2 diabetes mellitus without complications: Secondary | ICD-10-CM | POA: Diagnosis not present

## 2023-05-02 DIAGNOSIS — G20B2 Parkinson's disease with dyskinesia, with fluctuations: Secondary | ICD-10-CM

## 2023-05-02 DIAGNOSIS — I10 Essential (primary) hypertension: Secondary | ICD-10-CM

## 2023-05-02 NOTE — Patient Instructions (Addendum)
You need to use a walker at all times  We discussed driver rehab services and [facility] driving.  If you want to use [facility], you will need a referral. Driver Rehab servicesAddress: [address] [place], [place] [address] Hours:  Closed ? Opens 9?AM Fri Phone: [phone]  [facility] [phone]

## 2023-05-03 DIAGNOSIS — I5041 Acute combined systolic (congestive) and diastolic (congestive) heart failure: Secondary | ICD-10-CM | POA: Diagnosis not present

## 2023-05-03 DIAGNOSIS — I130 Hypertensive heart and chronic kidney disease with heart failure and stage 1 through stage 4 chronic kidney disease, or unspecified chronic kidney disease: Secondary | ICD-10-CM | POA: Diagnosis not present

## 2023-05-03 DIAGNOSIS — N179 Acute kidney failure, unspecified: Secondary | ICD-10-CM | POA: Diagnosis not present

## 2023-05-03 DIAGNOSIS — I872 Venous insufficiency (chronic) (peripheral): Secondary | ICD-10-CM | POA: Diagnosis not present

## 2023-05-03 DIAGNOSIS — E1165 Type 2 diabetes mellitus with hyperglycemia: Secondary | ICD-10-CM | POA: Diagnosis not present

## 2023-05-03 DIAGNOSIS — K5900 Constipation, unspecified: Secondary | ICD-10-CM | POA: Diagnosis not present

## 2023-05-03 DIAGNOSIS — J309 Allergic rhinitis, unspecified: Secondary | ICD-10-CM | POA: Diagnosis not present

## 2023-05-03 DIAGNOSIS — M1990 Unspecified osteoarthritis, unspecified site: Secondary | ICD-10-CM | POA: Diagnosis not present

## 2023-05-03 DIAGNOSIS — G20B1 Parkinson's disease with dyskinesia, without mention of fluctuations: Secondary | ICD-10-CM | POA: Diagnosis not present

## 2023-05-03 DIAGNOSIS — N183 Chronic kidney disease, stage 3 unspecified: Secondary | ICD-10-CM | POA: Diagnosis not present

## 2023-05-03 DIAGNOSIS — N3000 Acute cystitis without hematuria: Secondary | ICD-10-CM | POA: Diagnosis not present

## 2023-05-03 DIAGNOSIS — K219 Gastro-esophageal reflux disease without esophagitis: Secondary | ICD-10-CM | POA: Diagnosis not present

## 2023-05-03 DIAGNOSIS — R1313 Dysphagia, pharyngeal phase: Secondary | ICD-10-CM | POA: Diagnosis not present

## 2023-05-03 DIAGNOSIS — M810 Age-related osteoporosis without current pathological fracture: Secondary | ICD-10-CM | POA: Diagnosis not present

## 2023-05-03 DIAGNOSIS — I2510 Atherosclerotic heart disease of native coronary artery without angina pectoris: Secondary | ICD-10-CM | POA: Diagnosis not present

## 2023-05-03 DIAGNOSIS — G2581 Restless legs syndrome: Secondary | ICD-10-CM | POA: Diagnosis not present

## 2023-05-03 DIAGNOSIS — E1122 Type 2 diabetes mellitus with diabetic chronic kidney disease: Secondary | ICD-10-CM | POA: Diagnosis not present

## 2023-05-03 DIAGNOSIS — I444 Left anterior fascicular block: Secondary | ICD-10-CM | POA: Diagnosis not present

## 2023-05-03 DIAGNOSIS — N201 Calculus of ureter: Secondary | ICD-10-CM | POA: Diagnosis not present

## 2023-05-03 DIAGNOSIS — E1151 Type 2 diabetes mellitus with diabetic peripheral angiopathy without gangrene: Secondary | ICD-10-CM | POA: Diagnosis not present

## 2023-05-03 DIAGNOSIS — D72829 Elevated white blood cell count, unspecified: Secondary | ICD-10-CM | POA: Diagnosis not present

## 2023-05-07 DIAGNOSIS — D72829 Elevated white blood cell count, unspecified: Secondary | ICD-10-CM | POA: Diagnosis not present

## 2023-05-07 DIAGNOSIS — I872 Venous insufficiency (chronic) (peripheral): Secondary | ICD-10-CM | POA: Diagnosis not present

## 2023-05-07 DIAGNOSIS — G20B1 Parkinson's disease with dyskinesia, without mention of fluctuations: Secondary | ICD-10-CM | POA: Diagnosis not present

## 2023-05-07 DIAGNOSIS — I2510 Atherosclerotic heart disease of native coronary artery without angina pectoris: Secondary | ICD-10-CM | POA: Diagnosis not present

## 2023-05-07 DIAGNOSIS — J309 Allergic rhinitis, unspecified: Secondary | ICD-10-CM | POA: Diagnosis not present

## 2023-05-07 DIAGNOSIS — N3000 Acute cystitis without hematuria: Secondary | ICD-10-CM | POA: Diagnosis not present

## 2023-05-07 DIAGNOSIS — K5900 Constipation, unspecified: Secondary | ICD-10-CM | POA: Diagnosis not present

## 2023-05-07 DIAGNOSIS — I130 Hypertensive heart and chronic kidney disease with heart failure and stage 1 through stage 4 chronic kidney disease, or unspecified chronic kidney disease: Secondary | ICD-10-CM | POA: Diagnosis not present

## 2023-05-07 DIAGNOSIS — M810 Age-related osteoporosis without current pathological fracture: Secondary | ICD-10-CM | POA: Diagnosis not present

## 2023-05-07 DIAGNOSIS — I5041 Acute combined systolic (congestive) and diastolic (congestive) heart failure: Secondary | ICD-10-CM | POA: Diagnosis not present

## 2023-05-07 DIAGNOSIS — I444 Left anterior fascicular block: Secondary | ICD-10-CM | POA: Diagnosis not present

## 2023-05-07 DIAGNOSIS — E1165 Type 2 diabetes mellitus with hyperglycemia: Secondary | ICD-10-CM | POA: Diagnosis not present

## 2023-05-07 DIAGNOSIS — G2581 Restless legs syndrome: Secondary | ICD-10-CM | POA: Diagnosis not present

## 2023-05-07 DIAGNOSIS — E1151 Type 2 diabetes mellitus with diabetic peripheral angiopathy without gangrene: Secondary | ICD-10-CM | POA: Diagnosis not present

## 2023-05-07 DIAGNOSIS — N179 Acute kidney failure, unspecified: Secondary | ICD-10-CM | POA: Diagnosis not present

## 2023-05-07 DIAGNOSIS — K219 Gastro-esophageal reflux disease without esophagitis: Secondary | ICD-10-CM | POA: Diagnosis not present

## 2023-05-07 DIAGNOSIS — E1122 Type 2 diabetes mellitus with diabetic chronic kidney disease: Secondary | ICD-10-CM | POA: Diagnosis not present

## 2023-05-07 DIAGNOSIS — N183 Chronic kidney disease, stage 3 unspecified: Secondary | ICD-10-CM | POA: Diagnosis not present

## 2023-05-07 DIAGNOSIS — R1313 Dysphagia, pharyngeal phase: Secondary | ICD-10-CM | POA: Diagnosis not present

## 2023-05-07 DIAGNOSIS — M1990 Unspecified osteoarthritis, unspecified site: Secondary | ICD-10-CM | POA: Diagnosis not present

## 2023-05-07 DIAGNOSIS — N201 Calculus of ureter: Secondary | ICD-10-CM | POA: Diagnosis not present

## 2023-05-09 DIAGNOSIS — N3000 Acute cystitis without hematuria: Secondary | ICD-10-CM | POA: Diagnosis not present

## 2023-05-09 DIAGNOSIS — R1313 Dysphagia, pharyngeal phase: Secondary | ICD-10-CM | POA: Diagnosis not present

## 2023-05-09 DIAGNOSIS — N201 Calculus of ureter: Secondary | ICD-10-CM | POA: Diagnosis not present

## 2023-05-09 DIAGNOSIS — I2510 Atherosclerotic heart disease of native coronary artery without angina pectoris: Secondary | ICD-10-CM | POA: Diagnosis not present

## 2023-05-09 DIAGNOSIS — I130 Hypertensive heart and chronic kidney disease with heart failure and stage 1 through stage 4 chronic kidney disease, or unspecified chronic kidney disease: Secondary | ICD-10-CM | POA: Diagnosis not present

## 2023-05-09 DIAGNOSIS — G20B1 Parkinson's disease with dyskinesia, without mention of fluctuations: Secondary | ICD-10-CM | POA: Diagnosis not present

## 2023-05-09 DIAGNOSIS — E1151 Type 2 diabetes mellitus with diabetic peripheral angiopathy without gangrene: Secondary | ICD-10-CM | POA: Diagnosis not present

## 2023-05-09 DIAGNOSIS — N179 Acute kidney failure, unspecified: Secondary | ICD-10-CM | POA: Diagnosis not present

## 2023-05-09 DIAGNOSIS — I444 Left anterior fascicular block: Secondary | ICD-10-CM | POA: Diagnosis not present

## 2023-05-09 DIAGNOSIS — J309 Allergic rhinitis, unspecified: Secondary | ICD-10-CM | POA: Diagnosis not present

## 2023-05-09 DIAGNOSIS — K5900 Constipation, unspecified: Secondary | ICD-10-CM | POA: Diagnosis not present

## 2023-05-09 DIAGNOSIS — N183 Chronic kidney disease, stage 3 unspecified: Secondary | ICD-10-CM | POA: Diagnosis not present

## 2023-05-09 DIAGNOSIS — M810 Age-related osteoporosis without current pathological fracture: Secondary | ICD-10-CM | POA: Diagnosis not present

## 2023-05-09 DIAGNOSIS — G2581 Restless legs syndrome: Secondary | ICD-10-CM | POA: Diagnosis not present

## 2023-05-09 DIAGNOSIS — E1165 Type 2 diabetes mellitus with hyperglycemia: Secondary | ICD-10-CM | POA: Diagnosis not present

## 2023-05-09 DIAGNOSIS — I5041 Acute combined systolic (congestive) and diastolic (congestive) heart failure: Secondary | ICD-10-CM | POA: Diagnosis not present

## 2023-05-09 DIAGNOSIS — D72829 Elevated white blood cell count, unspecified: Secondary | ICD-10-CM | POA: Diagnosis not present

## 2023-05-09 DIAGNOSIS — M1990 Unspecified osteoarthritis, unspecified site: Secondary | ICD-10-CM | POA: Diagnosis not present

## 2023-05-09 DIAGNOSIS — K219 Gastro-esophageal reflux disease without esophagitis: Secondary | ICD-10-CM | POA: Diagnosis not present

## 2023-05-09 DIAGNOSIS — I872 Venous insufficiency (chronic) (peripheral): Secondary | ICD-10-CM | POA: Diagnosis not present

## 2023-05-09 DIAGNOSIS — E1122 Type 2 diabetes mellitus with diabetic chronic kidney disease: Secondary | ICD-10-CM | POA: Diagnosis not present

## 2023-05-14 DIAGNOSIS — E1165 Type 2 diabetes mellitus with hyperglycemia: Secondary | ICD-10-CM | POA: Diagnosis not present

## 2023-05-14 DIAGNOSIS — K219 Gastro-esophageal reflux disease without esophagitis: Secondary | ICD-10-CM | POA: Diagnosis not present

## 2023-05-14 DIAGNOSIS — I5040 Unspecified combined systolic (congestive) and diastolic (congestive) heart failure: Secondary | ICD-10-CM | POA: Diagnosis not present

## 2023-05-14 DIAGNOSIS — N1831 Chronic kidney disease, stage 3a: Secondary | ICD-10-CM | POA: Diagnosis not present

## 2023-05-14 DIAGNOSIS — E7800 Pure hypercholesterolemia, unspecified: Secondary | ICD-10-CM | POA: Diagnosis not present

## 2023-05-14 DIAGNOSIS — I10 Essential (primary) hypertension: Secondary | ICD-10-CM | POA: Diagnosis not present

## 2023-05-15 DIAGNOSIS — J309 Allergic rhinitis, unspecified: Secondary | ICD-10-CM | POA: Diagnosis not present

## 2023-05-15 DIAGNOSIS — G20B1 Parkinson's disease with dyskinesia, without mention of fluctuations: Secondary | ICD-10-CM | POA: Diagnosis not present

## 2023-05-15 DIAGNOSIS — K219 Gastro-esophageal reflux disease without esophagitis: Secondary | ICD-10-CM | POA: Diagnosis not present

## 2023-05-15 DIAGNOSIS — N201 Calculus of ureter: Secondary | ICD-10-CM | POA: Diagnosis not present

## 2023-05-15 DIAGNOSIS — R1313 Dysphagia, pharyngeal phase: Secondary | ICD-10-CM | POA: Diagnosis not present

## 2023-05-15 DIAGNOSIS — I130 Hypertensive heart and chronic kidney disease with heart failure and stage 1 through stage 4 chronic kidney disease, or unspecified chronic kidney disease: Secondary | ICD-10-CM | POA: Diagnosis not present

## 2023-05-15 DIAGNOSIS — I5041 Acute combined systolic (congestive) and diastolic (congestive) heart failure: Secondary | ICD-10-CM | POA: Diagnosis not present

## 2023-05-15 DIAGNOSIS — M1990 Unspecified osteoarthritis, unspecified site: Secondary | ICD-10-CM | POA: Diagnosis not present

## 2023-05-15 DIAGNOSIS — E1151 Type 2 diabetes mellitus with diabetic peripheral angiopathy without gangrene: Secondary | ICD-10-CM | POA: Diagnosis not present

## 2023-05-15 DIAGNOSIS — I444 Left anterior fascicular block: Secondary | ICD-10-CM | POA: Diagnosis not present

## 2023-05-15 DIAGNOSIS — E1165 Type 2 diabetes mellitus with hyperglycemia: Secondary | ICD-10-CM | POA: Diagnosis not present

## 2023-05-15 DIAGNOSIS — M810 Age-related osteoporosis without current pathological fracture: Secondary | ICD-10-CM | POA: Diagnosis not present

## 2023-05-15 DIAGNOSIS — N179 Acute kidney failure, unspecified: Secondary | ICD-10-CM | POA: Diagnosis not present

## 2023-05-15 DIAGNOSIS — N183 Chronic kidney disease, stage 3 unspecified: Secondary | ICD-10-CM | POA: Diagnosis not present

## 2023-05-15 DIAGNOSIS — N3000 Acute cystitis without hematuria: Secondary | ICD-10-CM | POA: Diagnosis not present

## 2023-05-15 DIAGNOSIS — D72829 Elevated white blood cell count, unspecified: Secondary | ICD-10-CM | POA: Diagnosis not present

## 2023-05-15 DIAGNOSIS — K5900 Constipation, unspecified: Secondary | ICD-10-CM | POA: Diagnosis not present

## 2023-05-15 DIAGNOSIS — G2581 Restless legs syndrome: Secondary | ICD-10-CM | POA: Diagnosis not present

## 2023-05-15 DIAGNOSIS — I872 Venous insufficiency (chronic) (peripheral): Secondary | ICD-10-CM | POA: Diagnosis not present

## 2023-05-15 DIAGNOSIS — E1122 Type 2 diabetes mellitus with diabetic chronic kidney disease: Secondary | ICD-10-CM | POA: Diagnosis not present

## 2023-05-15 DIAGNOSIS — I2510 Atherosclerotic heart disease of native coronary artery without angina pectoris: Secondary | ICD-10-CM | POA: Diagnosis not present

## 2023-05-21 DIAGNOSIS — R82998 Other abnormal findings in urine: Secondary | ICD-10-CM | POA: Diagnosis not present

## 2023-05-27 DIAGNOSIS — E1165 Type 2 diabetes mellitus with hyperglycemia: Secondary | ICD-10-CM | POA: Diagnosis not present

## 2023-05-30 DIAGNOSIS — I5040 Unspecified combined systolic (congestive) and diastolic (congestive) heart failure: Secondary | ICD-10-CM | POA: Diagnosis not present

## 2023-05-30 DIAGNOSIS — N390 Urinary tract infection, site not specified: Secondary | ICD-10-CM | POA: Diagnosis not present

## 2023-05-30 DIAGNOSIS — N1831 Chronic kidney disease, stage 3a: Secondary | ICD-10-CM | POA: Diagnosis not present

## 2023-05-30 DIAGNOSIS — I130 Hypertensive heart and chronic kidney disease with heart failure and stage 1 through stage 4 chronic kidney disease, or unspecified chronic kidney disease: Secondary | ICD-10-CM | POA: Diagnosis not present

## 2023-06-03 ENCOUNTER — Other Ambulatory Visit: Payer: Self-pay | Admitting: Neurology

## 2023-06-03 DIAGNOSIS — G20B1 Parkinson's disease with dyskinesia, without mention of fluctuations: Secondary | ICD-10-CM

## 2023-06-04 DIAGNOSIS — E1122 Type 2 diabetes mellitus with diabetic chronic kidney disease: Secondary | ICD-10-CM | POA: Diagnosis not present

## 2023-06-04 DIAGNOSIS — J309 Allergic rhinitis, unspecified: Secondary | ICD-10-CM | POA: Diagnosis not present

## 2023-06-04 DIAGNOSIS — K5900 Constipation, unspecified: Secondary | ICD-10-CM | POA: Diagnosis not present

## 2023-06-04 DIAGNOSIS — K219 Gastro-esophageal reflux disease without esophagitis: Secondary | ICD-10-CM | POA: Diagnosis not present

## 2023-06-04 DIAGNOSIS — M810 Age-related osteoporosis without current pathological fracture: Secondary | ICD-10-CM | POA: Diagnosis not present

## 2023-06-04 DIAGNOSIS — N179 Acute kidney failure, unspecified: Secondary | ICD-10-CM | POA: Diagnosis not present

## 2023-06-04 DIAGNOSIS — I872 Venous insufficiency (chronic) (peripheral): Secondary | ICD-10-CM | POA: Diagnosis not present

## 2023-06-04 DIAGNOSIS — D72829 Elevated white blood cell count, unspecified: Secondary | ICD-10-CM | POA: Diagnosis not present

## 2023-06-04 DIAGNOSIS — E1151 Type 2 diabetes mellitus with diabetic peripheral angiopathy without gangrene: Secondary | ICD-10-CM | POA: Diagnosis not present

## 2023-06-04 DIAGNOSIS — G2581 Restless legs syndrome: Secondary | ICD-10-CM | POA: Diagnosis not present

## 2023-06-04 DIAGNOSIS — I444 Left anterior fascicular block: Secondary | ICD-10-CM | POA: Diagnosis not present

## 2023-06-04 DIAGNOSIS — I5041 Acute combined systolic (congestive) and diastolic (congestive) heart failure: Secondary | ICD-10-CM | POA: Diagnosis not present

## 2023-06-04 DIAGNOSIS — E1165 Type 2 diabetes mellitus with hyperglycemia: Secondary | ICD-10-CM | POA: Diagnosis not present

## 2023-06-04 DIAGNOSIS — M1990 Unspecified osteoarthritis, unspecified site: Secondary | ICD-10-CM | POA: Diagnosis not present

## 2023-06-04 DIAGNOSIS — N183 Chronic kidney disease, stage 3 unspecified: Secondary | ICD-10-CM | POA: Diagnosis not present

## 2023-06-04 DIAGNOSIS — G20B1 Parkinson's disease with dyskinesia, without mention of fluctuations: Secondary | ICD-10-CM | POA: Diagnosis not present

## 2023-06-04 DIAGNOSIS — N201 Calculus of ureter: Secondary | ICD-10-CM | POA: Diagnosis not present

## 2023-06-04 DIAGNOSIS — N3000 Acute cystitis without hematuria: Secondary | ICD-10-CM | POA: Diagnosis not present

## 2023-06-04 DIAGNOSIS — I2510 Atherosclerotic heart disease of native coronary artery without angina pectoris: Secondary | ICD-10-CM | POA: Diagnosis not present

## 2023-06-04 DIAGNOSIS — R1313 Dysphagia, pharyngeal phase: Secondary | ICD-10-CM | POA: Diagnosis not present

## 2023-06-04 DIAGNOSIS — I130 Hypertensive heart and chronic kidney disease with heart failure and stage 1 through stage 4 chronic kidney disease, or unspecified chronic kidney disease: Secondary | ICD-10-CM | POA: Diagnosis not present

## 2023-06-11 DIAGNOSIS — N390 Urinary tract infection, site not specified: Secondary | ICD-10-CM | POA: Diagnosis not present

## 2023-06-12 DIAGNOSIS — R3914 Feeling of incomplete bladder emptying: Secondary | ICD-10-CM | POA: Diagnosis not present

## 2023-06-12 DIAGNOSIS — N3020 Other chronic cystitis without hematuria: Secondary | ICD-10-CM | POA: Diagnosis not present

## 2023-06-13 ENCOUNTER — Telehealth: Payer: Self-pay

## 2023-06-13 NOTE — Telephone Encounter (Signed)
Wife called to report patient has possible UTI and Urology as well as PCP have requested follow up with Dr Renold Don however he is booked out. Mitzi Davenport helped provide appt for 30 min with Marcos Eke on 06/20/23 at 1 pm. Wife has already asked Urology and PCP to fax both UCX done this week to our office. He will also start Bactrim today.

## 2023-06-19 DIAGNOSIS — I5041 Acute combined systolic (congestive) and diastolic (congestive) heart failure: Secondary | ICD-10-CM | POA: Diagnosis not present

## 2023-06-19 DIAGNOSIS — I10 Essential (primary) hypertension: Secondary | ICD-10-CM | POA: Diagnosis not present

## 2023-06-19 DIAGNOSIS — I2510 Atherosclerotic heart disease of native coronary artery without angina pectoris: Secondary | ICD-10-CM | POA: Diagnosis not present

## 2023-06-19 DIAGNOSIS — G20C Parkinsonism, unspecified: Secondary | ICD-10-CM | POA: Diagnosis not present

## 2023-06-19 DIAGNOSIS — E785 Hyperlipidemia, unspecified: Secondary | ICD-10-CM | POA: Diagnosis not present

## 2023-06-19 DIAGNOSIS — E1165 Type 2 diabetes mellitus with hyperglycemia: Secondary | ICD-10-CM | POA: Diagnosis not present

## 2023-06-19 DIAGNOSIS — G20B2 Parkinson's disease with dyskinesia, with fluctuations: Secondary | ICD-10-CM | POA: Diagnosis not present

## 2023-06-20 ENCOUNTER — Ambulatory Visit: Payer: Medicare Other | Admitting: Family

## 2023-06-20 ENCOUNTER — Telehealth: Payer: Self-pay

## 2023-06-20 NOTE — Progress Notes (Deleted)
Subjective:    Patient ID: [name], male    DOB: 1946/03/04, 77 y.o.   MRN: [id number]  No chief complaint on file.   HPI:  [name] is a 77 y.o. male with Parkinson's disease and recurrent UTI last seen by Dr. Renold Don on 08/21/22 for follow up of gross hematura and concern for infection with CT showing air in the bladder with only symptom of being more sleepy than normal. 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 Had 2 hospital admissions that were treated with carbapenem IV.  Has been seen by PCP and Urology with requested follow up with ID.   Most recent cultures since last office visit:  Culture from 04/01/23   Culture from  05/21/23    Culture from 8/27       Allergies  Allergen Reactions   Nucynta [Tapentadol] Other (See Comments)    Made the patient not feel well. "He did not feel well at all."   Oxycodone Itching   Sudafed [Pseudoephedrine Hcl] Other (See Comments)    Problems urinating    Tramadol Hcl Itching and Other (See Comments)    Can tolerate, however (in 2023)      Outpatient Medications Prior to Visit  Medication Sig Dispense Refill   acetaminophen (TYLENOL) 500 MG tablet Take 500-1,000 mg by mouth every 6 (six) hours as needed (for pain).     Ascorbic Acid (VITAMIN C) 100 MG tablet Take 100 mg by mouth in the morning and at bedtime.     aspirin EC 81 MG tablet Take 81 mg by mouth daily. Swallow whole.     calcium carbonate (TUMS) 500 MG chewable tablet Chew 2 tablets by mouth daily as needed for indigestion or heartburn.     carbidopa-levodopa (SINEMET IR) 25-100 MG tablet TAKE 3 TABLETS BY MOUTH AT 7:00 AM, 3 TABS AT 10:00 AM, 3 TABS AT 12:00 PM, 3 TABS AT 2:00 PM, 3 TABS AT 4:00, AND 3 TABS AT 6:00 PM (Patient taking differently: Take 3 tablets by mouth 6 (six) times daily. TAKE 3 TABLETS BY MOUTH AT 7:00 AM, 3 TABS AT 10:00 AM, 3 TABS AT 12:00 PM, 3 TABS AT 2:00 PM, 3 TABS AT 4:00, AND 3  TABS AT 6:00 PM) 1620 tablet 0   Cholecalciferol (VITAMIN D) 50 MCG (2000 UT) tablet Take 2,000 Units by mouth daily.     clopidogrel (PLAVIX) 75 MG tablet Take 1 tablet (75 mg total) by mouth daily with breakfast. 90 tablet 2   Continuous Glucose Sensor (DEXCOM G7 SENSOR) MISC 1 each by Does not apply route as needed. 1 each 5   entacapone (COMTAN) 200 MG tablet TAKE 1 TABLET BY MOUTH 3 TIMES DAILY (WITH FIRST THREE DOSAGES OF CARBIDOPA/LEVODOPA). 270 tablet 0   ENTRESTO 24-26 MG TAKE 1 TABLET BY MOUTH 2 TIMES DAILY. 60 tablet 6   furosemide (LASIX) 20 MG tablet Take 2 tablets (40 mg total) by mouth daily. 180 tablet 3   GLOBAL EASE INJECT PEN NEEDLES 31G X 5 MM MISC Inject into the skin.     ibuprofen (ADVIL) 200 MG tablet Take 400 mg by mouth daily.     insulin lispro (HUMALOG) 100 UNIT/ML KwikPen Inject 2-15 Units into the skin 4 (four) times daily -  before meals and at bedtime. CBG 121 - 150: 2 units, CBG 151 - 200: 3 units, CBG 201 - 250: 5 units, CBG 251 - 300: 8  units, CBG 301 - 350: 11 units, CBG 351 - 400: 15 units 15 mL 11   LANTUS SOLOSTAR 100 UNIT/ML Solostar Pen Inject 25 Units into the skin at bedtime. 15 mL 11   Levodopa (INBRIJA) 42 MG CAPS Samples of this drug were given to the patient, quantity 1, Lot Number [id number] exp 12-24 60 capsule 0   methenamine (HIPREX) 1 g tablet TAKE 1 TABLET BY MOUTH 2 TIMES DAILY. TAKE WITH ANY VITAMIN C, TWICE A DAY FOR UTI PREVENTION (Patient taking differently: Take 1 g by mouth 2 (two) times daily with a meal.) 180 tablet 3   metoprolol succinate (TOPROL-XL) 50 MG 24 hr tablet Take 1 tablet (50 mg total) by mouth daily. Take with or immediately following a meal. 90 tablet 3   nitroGLYCERIN (NITROSTAT) 0.4 MG SL tablet Place 1 tablet (0.4 mg total) under the tongue every 5 (five) minutes as needed. (Patient not taking: Reported on 05/02/2023) 25 tablet 2   pantoprazole (PROTONIX) 40 MG tablet Take 1 tablet (40 mg total) by mouth daily. 30 tablet 1    potassium chloride (KLOR-CON) 10 MEQ tablet Take 10 mEq by mouth 2 (two) times daily.     rOPINIRole (REQUIP) 3 MG tablet Take 3 mg by mouth at bedtime. 1 to 3 hours before bed time     rosuvastatin (CRESTOR) 40 MG tablet Take 1 tablet (40 mg total) by mouth daily. 90 tablet 2   tamsulosin (FLOMAX) 0.4 MG CAPS capsule Take 0.4 mg by mouth in the morning and at bedtime.  11   triamcinolone (NASACORT) 55 MCG/ACT AERO nasal inhaler Place 2 sprays into the nose daily as needed (for allergies or rhinitis).     vitamin B-12 (CYANOCOBALAMIN) 1000 MCG tablet Take 2,000 mcg by mouth daily.     No facility-administered medications prior to visit.     Past Medical History:  Diagnosis Date   Abnormal involuntary movement 02/18/2018   Allergic rhinitis    Arthritis    BPH (benign prostatic hyperplasia)    Diabetes (HCC)    Dysphagia, pharyngeal phase    GERD diagnosed on barium swallow. Has small hiatal hernia. Symptomatically somewhat better on omeprazole but not entirely. We'll try b.i.d. therapy   Edema    1+ in both ankles, likely multifactorial including medication such as Requip   Erectile dysfunction    Staxyn 10 mg or Viagra worked well. 3 samples of Cialis 20 mg provided   GERD (gastroesophageal reflux disease)    Hypercholesteremia    Hypertension    Nephrolithiasis    Onychomycosis of toenail    April 27, 2013 - Dr. Merwyn Katos - podiatry, was in [place] - treating with oral Lamisil and topical nail therapy   Parkinson's disease     followed by Dr. Raquel Sarna at [facility] and Lesia Sago, M.D. in [place]   Presbycusis    and tinnitus - Dr. Serena Colonel - August/2013   Renal calculus    Syncope      Past Surgical History:  Procedure Laterality Date   CORONARY STENT INTERVENTION N/A 01/11/2023   Procedure: CORONARY STENT INTERVENTION;  Surgeon: Kathleene Hazel, MD;  Location: [facility];  Service: Cardiovascular;  Laterality: N/A;   HAND SURGERY     INGUINAL  HERNIA REPAIR Left 11/04/2015   Procedure: LEFT INGUINAL HERNIA REPAIR WITH MESH;  Surgeon: Darnell Level, MD;  Location: [facility];  Service: General;  Laterality: Left;   INSERTION OF MESH Left  11/04/2015   Procedure: INSERTION OF MESH;  Surgeon: Darnell Level, MD;  Location: [facility];  Service: General;  Laterality: Left;   JOINT REPLACEMENT Bilateral    KNEE SURGERY     LEFT HEART CATH AND CORONARY ANGIOGRAPHY N/A 01/11/2023   Procedure: LEFT HEART CATH AND CORONARY ANGIOGRAPHY;  Surgeon: Kathleene Hazel, MD;  Location: [facility];  Service: Cardiovascular;  Laterality: N/A;   TOTAL KNEE ARTHROPLASTY         Review of Systems    Objective:    There were no vitals taken for this visit. Nursing note and vital signs reviewed.  Physical Exam      05/29/2022    1:40 PM 04/25/2022    3:35 PM 04/20/2022   10:16 AM 04/19/2022   10:21 AM 03/13/2022   10:03 AM  Depression screen PHQ 2/9  Decreased Interest 0 0 0 0 0  Down, Depressed, Hopeless 0 0 0 0 1  PHQ - 2 Score 0 0 0 0 1       Assessment & Plan:    Patient Active Problem List   Diagnosis Date Noted   Elevated troponin 04/03/2023   Sepsis (HCC) 04/01/2023   Coronary artery disease involving native coronary 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 angina pectoris 01/11/2023   Acute combined systolic and diastolic heart failure (HCC)    UTI due to extended-spectrum beta lactamase (ESBL) producing Escherichia coli 04/29/2022   Dysphagia 04/29/2022   AKI (acute kidney injury) (HCC)    Dehydration    Failure to thrive (child)    Bacteriuria, asymptomatic    Failure to thrive in adult 04/20/2022   Malaise, possible ESBL Klebsiella UTI 04/20/2022   Chronic retention of urine 04/20/2022   Closed lumbar vertebral fracture (HCC) 04/20/2022   UTI (urinary tract infection) 03/01/2022   Leukocytosis 01/01/2022   Hyponatremia 01/01/2022   CKD (chronic kidney disease), stage III (HCC) 01/01/2022    HTN (hypertension) 12/31/2021   History of ESBL Klebsiella pneumoniae infection 12/31/2021   Generalized weakness 12/31/2021   Chronic venous insufficiency 08/29/2021   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   Encounter for general adult medical examination with abnormal findings 02/18/2018   Enlarged prostate 02/18/2018   Hearing loss 02/18/2018   Other long term (current) drug therapy 02/18/2018   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) 02/18/2018   HLD (hyperlipidemia)    Nephrolithiasis    Parkinson disease (HCC)    Diabetes mellitus with coincident hypertension (HCC)    Syncope    Renal calculus    Diabetes (HCC)    Allergic rhinitis    BPH (benign prostatic hyperplasia)    Erectile dysfunction    Dysphagia, pharyngeal phase    Edema    Ureteral calculus 02/09/2013   LEG PAIN 03/14/2010     Problem List Items Addressed This Visit   None    I am having [name] maintain his Vitamin D, tamsulosin, Global Ease Inject Pen Needles, cyanocobalamin, triamcinolone, acetaminophen, vitamin C, calcium carbonate, potassium chloride, Inbrija, metoprolol succinate, furosemide, aspirin EC, clopidogrel, pantoprazole, nitroGLYCERIN, carbidopa-levodopa, Entresto, methenamine, ibuprofen, Lantus SoloStar, Dexcom G7 Sensor, insulin lispro, rosuvastatin, rOPINIRole, and entacapone.   No orders of the defined types were placed in this encounter.    Follow-up: No follow-ups on file.   Marcos Eke, MSN, FNP-C Nurse Practitioner [facility] Main number: [phone]

## 2023-06-20 NOTE — Telephone Encounter (Signed)
Rescheduled for 07/04/23 paperwork and ucx filed in referral file for Appt with [facility].

## 2023-06-21 DIAGNOSIS — I444 Left anterior fascicular block: Secondary | ICD-10-CM | POA: Diagnosis not present

## 2023-06-21 DIAGNOSIS — E1165 Type 2 diabetes mellitus with hyperglycemia: Secondary | ICD-10-CM | POA: Diagnosis not present

## 2023-06-21 DIAGNOSIS — K219 Gastro-esophageal reflux disease without esophagitis: Secondary | ICD-10-CM | POA: Diagnosis not present

## 2023-06-21 DIAGNOSIS — I5041 Acute combined systolic (congestive) and diastolic (congestive) heart failure: Secondary | ICD-10-CM | POA: Diagnosis not present

## 2023-06-21 DIAGNOSIS — N183 Chronic kidney disease, stage 3 unspecified: Secondary | ICD-10-CM | POA: Diagnosis not present

## 2023-06-21 DIAGNOSIS — E1151 Type 2 diabetes mellitus with diabetic peripheral angiopathy without gangrene: Secondary | ICD-10-CM | POA: Diagnosis not present

## 2023-06-21 DIAGNOSIS — J309 Allergic rhinitis, unspecified: Secondary | ICD-10-CM | POA: Diagnosis not present

## 2023-06-21 DIAGNOSIS — M810 Age-related osteoporosis without current pathological fracture: Secondary | ICD-10-CM | POA: Diagnosis not present

## 2023-06-21 DIAGNOSIS — N179 Acute kidney failure, unspecified: Secondary | ICD-10-CM | POA: Diagnosis not present

## 2023-06-21 DIAGNOSIS — D72829 Elevated white blood cell count, unspecified: Secondary | ICD-10-CM | POA: Diagnosis not present

## 2023-06-21 DIAGNOSIS — M1990 Unspecified osteoarthritis, unspecified site: Secondary | ICD-10-CM | POA: Diagnosis not present

## 2023-06-21 DIAGNOSIS — N3000 Acute cystitis without hematuria: Secondary | ICD-10-CM | POA: Diagnosis not present

## 2023-06-21 DIAGNOSIS — I2510 Atherosclerotic heart disease of native coronary artery without angina pectoris: Secondary | ICD-10-CM | POA: Diagnosis not present

## 2023-06-21 DIAGNOSIS — G20B1 Parkinson's disease with dyskinesia, without mention of fluctuations: Secondary | ICD-10-CM | POA: Diagnosis not present

## 2023-06-21 DIAGNOSIS — I872 Venous insufficiency (chronic) (peripheral): Secondary | ICD-10-CM | POA: Diagnosis not present

## 2023-06-21 DIAGNOSIS — I130 Hypertensive heart and chronic kidney disease with heart failure and stage 1 through stage 4 chronic kidney disease, or unspecified chronic kidney disease: Secondary | ICD-10-CM | POA: Diagnosis not present

## 2023-06-21 DIAGNOSIS — K5900 Constipation, unspecified: Secondary | ICD-10-CM | POA: Diagnosis not present

## 2023-06-21 DIAGNOSIS — E1122 Type 2 diabetes mellitus with diabetic chronic kidney disease: Secondary | ICD-10-CM | POA: Diagnosis not present

## 2023-06-21 DIAGNOSIS — N201 Calculus of ureter: Secondary | ICD-10-CM | POA: Diagnosis not present

## 2023-06-21 DIAGNOSIS — G2581 Restless legs syndrome: Secondary | ICD-10-CM | POA: Diagnosis not present

## 2023-06-21 DIAGNOSIS — R1313 Dysphagia, pharyngeal phase: Secondary | ICD-10-CM | POA: Diagnosis not present

## 2023-06-23 ENCOUNTER — Encounter (HOSPITAL_COMMUNITY): Payer: Self-pay

## 2023-06-23 ENCOUNTER — Emergency Department (HOSPITAL_COMMUNITY): Payer: Medicare Other

## 2023-06-23 ENCOUNTER — Emergency Department (HOSPITAL_BASED_OUTPATIENT_CLINIC_OR_DEPARTMENT_OTHER)
Admission: EM | Admit: 2023-06-23 | Discharge: 2023-06-23 | Discharge status: Absent without leave | Payer: Medicare Other | Source: Home / Self Care

## 2023-06-23 ENCOUNTER — Inpatient Hospital Stay (HOSPITAL_COMMUNITY)
Admission: EM | Admit: 2023-06-23 | Discharge: 2023-06-25 | DRG: 690 | Disposition: A | Discharge status: Skilled Nursing Facility | Payer: Medicare Other | Attending: Family Medicine | Admitting: Family Medicine

## 2023-06-23 ENCOUNTER — Other Ambulatory Visit: Payer: Self-pay

## 2023-06-23 DIAGNOSIS — I5042 Chronic combined systolic (congestive) and diastolic (congestive) heart failure: Secondary | ICD-10-CM | POA: Diagnosis present

## 2023-06-23 DIAGNOSIS — Z7982 Long term (current) use of aspirin: Secondary | ICD-10-CM | POA: Diagnosis not present

## 2023-06-23 DIAGNOSIS — R5381 Other malaise: Secondary | ICD-10-CM | POA: Diagnosis not present

## 2023-06-23 DIAGNOSIS — E860 Dehydration: Secondary | ICD-10-CM | POA: Diagnosis not present

## 2023-06-23 DIAGNOSIS — I10 Essential (primary) hypertension: Secondary | ICD-10-CM | POA: Diagnosis present

## 2023-06-23 DIAGNOSIS — Z1612 Extended spectrum beta lactamase (ESBL) resistance: Secondary | ICD-10-CM | POA: Diagnosis not present

## 2023-06-23 DIAGNOSIS — B9620 Unspecified Escherichia coli [E. coli] as the cause of diseases classified elsewhere: Secondary | ICD-10-CM | POA: Diagnosis present

## 2023-06-23 DIAGNOSIS — Z66 Do not resuscitate: Secondary | ICD-10-CM | POA: Diagnosis not present

## 2023-06-23 DIAGNOSIS — E1165 Type 2 diabetes mellitus with hyperglycemia: Secondary | ICD-10-CM | POA: Diagnosis present

## 2023-06-23 DIAGNOSIS — Z794 Long term (current) use of insulin: Secondary | ICD-10-CM

## 2023-06-23 DIAGNOSIS — H579 Unspecified disorder of eye and adnexa: Secondary | ICD-10-CM | POA: Diagnosis not present

## 2023-06-23 DIAGNOSIS — H1132 Conjunctival hemorrhage, left eye: Secondary | ICD-10-CM | POA: Diagnosis not present

## 2023-06-23 DIAGNOSIS — Z743 Need for continuous supervision: Secondary | ICD-10-CM | POA: Diagnosis not present

## 2023-06-23 DIAGNOSIS — N1831 Chronic kidney disease, stage 3a: Secondary | ICD-10-CM | POA: Diagnosis not present

## 2023-06-23 DIAGNOSIS — Z825 Family history of asthma and other chronic lower respiratory diseases: Secondary | ICD-10-CM

## 2023-06-23 DIAGNOSIS — Z808 Family history of malignant neoplasm of other organs or systems: Secondary | ICD-10-CM

## 2023-06-23 DIAGNOSIS — S0590XA Unspecified injury of unspecified eye and orbit, initial encounter: Secondary | ICD-10-CM | POA: Diagnosis not present

## 2023-06-23 DIAGNOSIS — N400 Enlarged prostate without lower urinary tract symptoms: Secondary | ICD-10-CM | POA: Diagnosis present

## 2023-06-23 DIAGNOSIS — R58 Hemorrhage, not elsewhere classified: Secondary | ICD-10-CM | POA: Diagnosis not present

## 2023-06-23 DIAGNOSIS — G4700 Insomnia, unspecified: Secondary | ICD-10-CM | POA: Diagnosis present

## 2023-06-23 DIAGNOSIS — M810 Age-related osteoporosis without current pathological fracture: Secondary | ICD-10-CM | POA: Diagnosis present

## 2023-06-23 DIAGNOSIS — G20A1 Parkinson's disease without dyskinesia, without mention of fluctuations: Secondary | ICD-10-CM | POA: Diagnosis present

## 2023-06-23 DIAGNOSIS — Z885 Allergy status to narcotic agent status: Secondary | ICD-10-CM | POA: Diagnosis not present

## 2023-06-23 DIAGNOSIS — G9340 Encephalopathy, unspecified: Secondary | ICD-10-CM | POA: Diagnosis not present

## 2023-06-23 DIAGNOSIS — E7800 Pure hypercholesterolemia, unspecified: Secondary | ICD-10-CM | POA: Diagnosis present

## 2023-06-23 DIAGNOSIS — E1139 Type 2 diabetes mellitus with other diabetic ophthalmic complication: Secondary | ICD-10-CM | POA: Diagnosis not present

## 2023-06-23 DIAGNOSIS — E1122 Type 2 diabetes mellitus with diabetic chronic kidney disease: Secondary | ICD-10-CM | POA: Diagnosis present

## 2023-06-23 DIAGNOSIS — I130 Hypertensive heart and chronic kidney disease with heart failure and stage 1 through stage 4 chronic kidney disease, or unspecified chronic kidney disease: Secondary | ICD-10-CM | POA: Diagnosis not present

## 2023-06-23 DIAGNOSIS — I2510 Atherosclerotic heart disease of native coronary artery without angina pectoris: Secondary | ICD-10-CM | POA: Diagnosis not present

## 2023-06-23 DIAGNOSIS — N183 Chronic kidney disease, stage 3 unspecified: Secondary | ICD-10-CM | POA: Diagnosis present

## 2023-06-23 DIAGNOSIS — Z96653 Presence of artificial knee joint, bilateral: Secondary | ICD-10-CM | POA: Diagnosis present

## 2023-06-23 DIAGNOSIS — Z955 Presence of coronary angioplasty implant and graft: Secondary | ICD-10-CM

## 2023-06-23 DIAGNOSIS — K219 Gastro-esophageal reflux disease without esophagitis: Secondary | ICD-10-CM | POA: Diagnosis present

## 2023-06-23 DIAGNOSIS — H11429 Conjunctival edema, unspecified eye: Secondary | ICD-10-CM

## 2023-06-23 DIAGNOSIS — Z79899 Other long term (current) drug therapy: Secondary | ICD-10-CM

## 2023-06-23 DIAGNOSIS — R519 Headache, unspecified: Secondary | ICD-10-CM | POA: Diagnosis not present

## 2023-06-23 DIAGNOSIS — Z87440 Personal history of urinary (tract) infections: Secondary | ICD-10-CM

## 2023-06-23 DIAGNOSIS — F32A Depression, unspecified: Secondary | ICD-10-CM | POA: Insufficient documentation

## 2023-06-23 DIAGNOSIS — E119 Type 2 diabetes mellitus without complications: Secondary | ICD-10-CM

## 2023-06-23 DIAGNOSIS — Z1152 Encounter for screening for COVID-19: Secondary | ICD-10-CM

## 2023-06-23 DIAGNOSIS — Z803 Family history of malignant neoplasm of breast: Secondary | ICD-10-CM

## 2023-06-23 DIAGNOSIS — N390 Urinary tract infection, site not specified: Principal | ICD-10-CM | POA: Diagnosis present

## 2023-06-23 DIAGNOSIS — R220 Localized swelling, mass and lump, head: Secondary | ICD-10-CM | POA: Diagnosis not present

## 2023-06-23 DIAGNOSIS — Z888 Allergy status to other drugs, medicaments and biological substances status: Secondary | ICD-10-CM

## 2023-06-23 DIAGNOSIS — Z7902 Long term (current) use of antithrombotics/antiplatelets: Secondary | ICD-10-CM

## 2023-06-23 DIAGNOSIS — G4489 Other headache syndrome: Secondary | ICD-10-CM | POA: Diagnosis not present

## 2023-06-23 DIAGNOSIS — H11422 Conjunctival edema, left eye: Secondary | ICD-10-CM

## 2023-06-23 DIAGNOSIS — Z8249 Family history of ischemic heart disease and other diseases of the circulatory system: Secondary | ICD-10-CM

## 2023-06-23 DIAGNOSIS — B9629 Other Escherichia coli [E. coli] as the cause of diseases classified elsewhere: Secondary | ICD-10-CM | POA: Diagnosis not present

## 2023-06-23 LAB — BASIC METABOLIC PANEL
Anion gap: 7 (ref 5–15)
BUN: 20 mg/dL (ref 8–23)
CO2: 26 mmol/L (ref 22–32)
Calcium: 8.7 mg/dL — ABNORMAL LOW (ref 8.9–10.3)
Chloride: 103 mmol/L (ref 98–111)
Creatinine, Ser: 1.35 mg/dL — ABNORMAL HIGH (ref 0.61–1.24)
GFR, Estimated: 54 mL/min — ABNORMAL LOW (ref >60)
Glucose, Bld: 181 mg/dL — ABNORMAL HIGH (ref 70–99)
Potassium: 4.5 mmol/L (ref 3.5–5.1)
Sodium: 136 mmol/L (ref 135–145)

## 2023-06-23 LAB — URINALYSIS, W/ REFLEX TO CULTURE (INFECTION SUSPECTED)
Bilirubin Urine: NEGATIVE
Glucose, UA: NEGATIVE mg/dL
Hgb urine dipstick: NEGATIVE
Ketones, ur: NEGATIVE mg/dL
Nitrite: NEGATIVE
Protein, ur: NEGATIVE mg/dL
Specific Gravity, Urine: 1.03 (ref 1.005–1.030)
pH: 5 (ref 5.0–8.0)

## 2023-06-23 LAB — CBC WITH DIFFERENTIAL/PLATELET
Abs Immature Granulocytes: 0.01 10*3/uL (ref 0.00–0.07)
Basophils Absolute: 0 10*3/uL (ref 0.0–0.1)
Basophils Relative: 1 %
Eosinophils Absolute: 0.1 10*3/uL (ref 0.0–0.5)
Eosinophils Relative: 1 %
HCT: 44.9 % (ref 39.0–52.0)
Hemoglobin: 14.6 g/dL (ref 13.0–17.0)
Immature Granulocytes: 0 %
Lymphocytes Relative: 13 %
Lymphs Abs: 0.7 10*3/uL (ref 0.7–4.0)
MCH: 29.7 pg (ref 26.0–34.0)
MCHC: 32.5 g/dL (ref 30.0–36.0)
MCV: 91.3 fL (ref 80.0–100.0)
Monocytes Absolute: 0.3 10*3/uL (ref 0.1–1.0)
Monocytes Relative: 6 %
Neutro Abs: 4.5 10*3/uL (ref 1.7–7.7)
Neutrophils Relative %: 79 %
Platelets: 238 10*3/uL (ref 150–400)
RBC: 4.92 MIL/uL (ref 4.22–5.81)
RDW: 12.7 % (ref 11.5–15.5)
WBC: 5.6 10*3/uL (ref 4.0–10.5)
nRBC: 0 % (ref 0.0–0.2)

## 2023-06-23 LAB — PROTIME-INR
INR: 1.1 (ref 0.8–1.2)
Prothrombin Time: 14.1 s (ref 11.4–15.2)

## 2023-06-23 MED ORDER — SODIUM CHLORIDE 0.9 % IV SOLN
1.0000 g | Freq: Once | INTRAVENOUS | Status: AC
  Administered 2023-06-24: 1 g via INTRAVENOUS
  Filled 2023-06-23: qty 20

## 2023-06-23 MED ORDER — IOHEXOL 350 MG/ML SOLN
75.0000 mL | Freq: Once | INTRAVENOUS | Status: AC | PRN
  Administered 2023-06-23: 75 mL via INTRAVENOUS

## 2023-06-23 MED ORDER — ACETAMINOPHEN 500 MG PO TABS
1000.0000 mg | ORAL_TABLET | Freq: Once | ORAL | Status: AC
  Administered 2023-06-23: 1000 mg via ORAL
  Filled 2023-06-23: qty 2

## 2023-06-23 MED ORDER — LACTATED RINGERS IV BOLUS
1000.0000 mL | Freq: Once | INTRAVENOUS | Status: AC
  Administered 2023-06-23: 1000 mL via INTRAVENOUS

## 2023-06-23 MED ORDER — FLUORESCEIN SODIUM 1 MG OP STRP
1.0000 | ORAL_STRIP | Freq: Once | OPHTHALMIC | Status: AC
  Administered 2023-06-24: 1 via OPHTHALMIC
  Filled 2023-06-23: qty 1

## 2023-06-23 MED ORDER — ERYTHROMYCIN 5 MG/GM OP OINT
1.0000 | TOPICAL_OINTMENT | Freq: Three times a day (TID) | OPHTHALMIC | Status: DC
  Administered 2023-06-23 – 2023-06-24 (×2): 1 via OPHTHALMIC
  Filled 2023-06-23 (×2): qty 3.5

## 2023-06-23 MED ORDER — TETRACAINE HCL 0.5 % OP SOLN
1.0000 [drp] | Freq: Once | OPHTHALMIC | Status: AC
  Administered 2023-06-24: 1 [drp] via OPHTHALMIC
  Filled 2023-06-23: qty 4

## 2023-06-23 NOTE — ED Triage Notes (Addendum)
Pt BIB [facility] d/t waking up this morning with his Lt eye swollen & stuck together, black to appearance in the center (family states filled with blood), blurred vision & a HA rating his pain 4/10. Pt denies any recent injuries to that eye, A/Ox4, Hx of Parkinson's Disease, diabetes & cardiac stents (per EMS), cbg 217 all other VSS. Pt does report cataract surgery 6 months ago)

## 2023-06-23 NOTE — ED Notes (Signed)
Eye exam. Left eye, 20/50. Right eye 20/40. Both eyes are 20/40. Pt didn't have any complaints during the eye exam, except for sensitivity to bright light.

## 2023-06-23 NOTE — ED Provider Notes (Signed)
[place] EMERGENCY DEPARTMENT AT [facility] Provider Note  CSN: [id number] Arrival date & time: 06/23/23 1704  Chief Complaint(s) Eye Problem, Blurred Vision, and Headache  HPI [name] is a 77 y.o. male history of Parkinson's, diabetes, hypertension, hyperlipidemia presenting to the emergency department with left eye abnormality.  Patient reports he woke up this morning with left eye swelling.  Reports his vision is mildly blurry..  Also had headache which is improving.  Reports redness to his eye.  Denies any head injury or eye trauma.  Patient's wife also reports that he has been more fatigued than normal, she is worried that he might have a recurrent urine infection.   Past Medical History Past Medical History:  Diagnosis Date   Abnormal involuntary movement 02/18/2018   Allergic rhinitis    Arthritis    BPH (benign prostatic hyperplasia)    Diabetes (HCC)    Dysphagia, pharyngeal phase    GERD diagnosed on barium swallow. Has small hiatal hernia. Symptomatically somewhat better on omeprazole but not entirely. We'll try b.i.d. therapy   Edema    1+ in both ankles, likely multifactorial including medication such as Requip   Erectile dysfunction    Staxyn 10 mg or Viagra worked well. 3 samples of Cialis 20 mg provided   GERD (gastroesophageal reflux disease)    Hypercholesteremia    Hypertension    Nephrolithiasis    Onychomycosis of toenail    April 27, 2013 - Dr. Merwyn Katos - podiatry, was in [place] - treating with oral Lamisil and topical nail therapy   Parkinson's disease     followed by Dr. Raquel Sarna at [facility] and Lesia Sago, M.D. in [facility]   Presbycusis    and tinnitus - Dr. Serena Colonel - August/2013   Renal calculus    Syncope    Patient Active Problem List   Diagnosis Date Noted   Elevated troponin 04/03/2023   Sepsis (HCC) 04/01/2023   Coronary artery disease involving native coronary 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 angina pectoris 01/11/2023    Acute combined systolic and diastolic heart failure (HCC)    UTI due to extended-spectrum beta lactamase (ESBL) producing Escherichia coli 04/29/2022   Dysphagia 04/29/2022   AKI (acute kidney injury) (HCC)    Dehydration    Failure to thrive (child)    Bacteriuria, asymptomatic    Failure to thrive in adult 04/20/2022   Malaise, possible ESBL Klebsiella UTI 04/20/2022   Chronic retention of urine 04/20/2022   Closed lumbar vertebral fracture (HCC) 04/20/2022   UTI (urinary tract infection) 03/01/2022   Leukocytosis 01/01/2022   Hyponatremia 01/01/2022   CKD (chronic kidney disease), stage III (HCC) 01/01/2022   HTN (hypertension) 12/31/2021   History of ESBL Klebsiella pneumoniae infection 12/31/2021   Generalized weakness 12/31/2021   Chronic venous insufficiency 08/29/2021   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   Encounter for general adult medical examination with abnormal findings 02/18/2018   Enlarged prostate 02/18/2018   Hearing loss 02/18/2018   Other long term (current) drug therapy 02/18/2018   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) 02/18/2018   HLD (hyperlipidemia)    Nephrolithiasis    Parkinson disease (HCC)    Diabetes mellitus with coincident hypertension (HCC)    Syncope    Renal calculus    Diabetes (HCC)    Allergic rhinitis    BPH (benign prostatic hyperplasia)    Erectile dysfunction    Dysphagia, pharyngeal phase    Edema  Ureteral calculus 02/09/2013   LEG PAIN 03/14/2010   Home Medication(s) Prior to Admission medications   Medication Sig Start Date End Date Taking? Authorizing Provider  acetaminophen (TYLENOL) 500 MG tablet Take 500-1,000 mg by mouth every 6 (six) hours as needed (for pain).    [provider]  Ascorbic Acid (VITAMIN C) 100 MG tablet Take 100 mg by mouth in the morning and at bedtime.    [provider]  aspirin EC 81 MG tablet Take 81 mg by mouth daily. Swallow whole.    [provider]  calcium carbonate (TUMS) 500 MG chewable tablet Chew 2 tablets by mouth daily as needed for indigestion or heartburn.    [provider]  carbidopa-levodopa (SINEMET IR) 25-100 MG tablet TAKE 3 TABLETS BY MOUTH AT 7:00 AM, 3 TABS AT 10:00 AM, 3 TABS AT 12:00 PM, 3 TABS AT 2:00 PM, 3 TABS AT 4:00, AND 3 TABS AT 6:00 PM Patient taking differently: Take 3 tablets by mouth 6 (six) times daily. TAKE 3 TABLETS BY MOUTH AT 7:00 AM, 3 TABS AT 10:00 AM, 3 TABS AT 12:00 PM, 3 TABS AT 2:00 PM, 3 TABS AT 4:00, AND 3 TABS AT 6:00 PM 02/05/23   [name], Octaviano Batty, DO  Cholecalciferol (VITAMIN D) 50 MCG (2000 UT) tablet Take 2,000 Units by mouth daily.    [provider]  clopidogrel (PLAVIX) 75 MG tablet Take 1 tablet (75 mg total) by mouth daily with breakfast. 01/12/23   Laverda Page B, NP  Continuous Glucose Sensor (DEXCOM G7 SENSOR) MISC 1 each by Does not apply route as needed. 04/04/23   Lewie Chamber, MD  entacapone (COMTAN) 200 MG tablet TAKE 1 TABLET BY MOUTH 3 TIMES DAILY (WITH FIRST THREE DOSAGES OF CARBIDOPA/LEVODOPA). 06/04/23   [name], Rebecca S, DO  ENTRESTO 24-26 MG TAKE 1 TABLET BY MOUTH 2 TIMES DAILY. 02/11/23   Jake Bathe, MD  furosemide (LASIX) 20 MG tablet Take 2 tablets (40 mg total) by mouth daily. 01/02/23   Duke Salvia, MD  GLOBAL EASE INJECT PEN NEEDLES 31G X 5 MM MISC Inject into the skin. 05/09/21   [provider]  ibuprofen (ADVIL) 200 MG tablet Take 400 mg by mouth daily.    [provider]  insulin lispro (HUMALOG) 100 UNIT/ML KwikPen Inject 2-15 Units into the skin 4 (four) times daily -  before meals and at bedtime. CBG 121 - 150: 2 units, CBG 151 - 200: 3 units, CBG 201 - 250: 5 units, CBG 251 - 300: 8 units, CBG 301 - 350: 11 units, CBG 351 - 400: 15 units 04/04/23   Lewie Chamber, MD  LANTUS SOLOSTAR 100 UNIT/ML Solostar Pen Inject 25 Units into the skin at bedtime. 04/04/23   Lewie Chamber, MD  Levodopa (INBRIJA) 42 MG CAPS  Samples of this drug were given to the patient, quantity 1, Lot Number [id number] exp 12-24 09/26/22   [name], Octaviano Batty, DO  methenamine (HIPREX) 1 g tablet TAKE 1 TABLET BY MOUTH 2 TIMES DAILY. TAKE WITH ANY VITAMIN C, TWICE A DAY FOR UTI PREVENTION Patient taking differently: Take 1 g by mouth 2 (two) times daily with a meal. 03/28/23   Vu, Gershon Mussel T, MD  metoprolol succinate (TOPROL-XL) 50 MG 24 hr tablet Take 1 tablet (50 mg total) by mouth daily. Take with or immediately following a meal. 01/02/23   Duke Salvia, MD  nitroGLYCERIN (NITROSTAT) 0.4 MG SL tablet Place 1 tablet (0.4 mg total)  under the tongue every 5 (five) minutes as needed. Patient not taking: Reported on 05/02/2023 01/11/23   Laverda Page B, NP  pantoprazole (PROTONIX) 40 MG tablet Take 1 tablet (40 mg total) by mouth daily. 01/11/23   Arty Baumgartner, NP  potassium chloride (KLOR-CON) 10 MEQ tablet Take 10 mEq by mouth 2 (two) times daily. 05/18/22   [provider]  rOPINIRole (REQUIP) 3 MG tablet Take 3 mg by mouth at bedtime. 1 to 3 hours before bed time    [provider]  rosuvastatin (CRESTOR) 40 MG tablet Take 1 tablet (40 mg total) by mouth daily. 04/16/23   Jake Bathe, MD  tamsulosin (FLOMAX) 0.4 MG CAPS capsule Take 0.4 mg by mouth in the morning and at bedtime. 01/24/18   [provider]  triamcinolone (NASACORT) 55 MCG/ACT AERO nasal inhaler Place 2 sprays into the nose daily as needed (for allergies or rhinitis).    [provider]  vitamin B-12 (CYANOCOBALAMIN) 1000 MCG tablet Take 2,000 mcg by mouth daily.    [provider]                                                                                                                                    Past Surgical History Past Surgical History:  Procedure Laterality Date   CORONARY STENT INTERVENTION N/A 01/11/2023   Procedure: CORONARY STENT INTERVENTION;  Surgeon: Kathleene Hazel, MD;  Location: [facility]  INVASIVE CV LAB;  Service: Cardiovascular;  Laterality: N/A;   HAND SURGERY     INGUINAL HERNIA REPAIR Left 11/04/2015   Procedure: LEFT INGUINAL HERNIA REPAIR WITH MESH;  Surgeon: Darnell Level, MD;  Location: [facility];  Service: General;  Laterality: Left;   INSERTION OF MESH Left 11/04/2015   Procedure: INSERTION OF MESH;  Surgeon: Darnell Level, MD;  Location: [facility];  Service: General;  Laterality: Left;   JOINT REPLACEMENT Bilateral    KNEE SURGERY     LEFT HEART CATH AND CORONARY ANGIOGRAPHY N/A 01/11/2023   Procedure: LEFT HEART CATH AND CORONARY ANGIOGRAPHY;  Surgeon: Kathleene Hazel, MD;  Location: [facility] INVASIVE CV LAB;  Service: Cardiovascular;  Laterality: N/A;   TOTAL KNEE ARTHROPLASTY     Family History Family History  Problem Relation Age of Onset   Atrial fibrillation Mother    Breast cancer Mother    Emphysema Father    Heart disease Father    Cancer Brother        African Burk[place]    Social History Social History   Tobacco Use   Smoking status: Never   Smokeless tobacco: Never  Vaping Use   Vaping status: Never Used  Substance Use Topics   Alcohol use: No   Drug use: No   Allergies Nucynta [tapentadol], Oxycodone, Sudafed [pseudoephedrine hcl], and Tramadol hcl  Review of Systems Review of Systems  All other systems reviewed and are  negative.   Physical Exam Vital Signs  I have reviewed the triage vital signs BP (!) 140/57   Pulse (!) 47   Temp 98.1 F (36.7 C) (Oral)   Resp 15   Ht 5\' 8"  (1.727 m)   Wt 81.6 kg   SpO2 99%   BMI 27.37 kg/m  Physical Exam Vitals and nursing note reviewed.  Constitutional:      General: He is not in acute distress.    Appearance: Normal appearance.  HENT:     Mouth/Throat:     Mouth: Mucous membranes are moist.  Eyes:     Conjunctiva/sclera: Conjunctivae normal.     Comments: Pupils equal and reactive.  Extraocular movements intact.  Right conjunctive normal, left  conjunctiva with significant subconjunctival hemorrhage with chemosis  Cardiovascular:     Rate and Rhythm: Normal rate and regular rhythm.  Pulmonary:     Effort: Pulmonary effort is normal. No respiratory distress.     Breath sounds: Normal breath sounds.  Abdominal:     General: Abdomen is flat.     Palpations: Abdomen is soft.     Tenderness: There is no abdominal tenderness.  Musculoskeletal:     Right lower leg: No edema.     Left lower leg: No edema.  Skin:    General: Skin is warm and dry.     Capillary Refill: Capillary refill takes less than 2 seconds.  Neurological:     Mental Status: He is alert and oriented to person, place, and time. Mental status is at baseline.  Psychiatric:        Mood and Affect: Mood normal.        Behavior: Behavior normal.     ED Results and Treatments Labs (all labs ordered are listed, but only abnormal results are displayed) Labs Reviewed  BASIC METABOLIC PANEL - Abnormal; Notable for the following components:      Result Value   Glucose, Bld 181 (*)    Creatinine, Ser 1.35 (*)    Calcium 8.7 (*)    GFR, Estimated 54 (*)    All other components within normal limits  URINALYSIS, W/ REFLEX TO CULTURE (INFECTION SUSPECTED) - Abnormal; Notable for the following components:   Leukocytes,Ua MODERATE (*)    Bacteria, UA FEW (*)    All other components within normal limits  URINE CULTURE  CBC WITH DIFFERENTIAL/PLATELET  PROTIME-INR                                                                                                                          Radiology CT OrbitsS W/O CM  Result Date: 06/23/2023 CLINICAL DATA:  Left periorbital swelling EXAM: CT ORBITS WITHOUT CONTRAST TECHNIQUE: Multidetector CT imaging of the orbits was performed using the standard protocol without intravenous contrast. Multiplanar CT image reconstructions were also generated. RADIATION DOSE REDUCTION: This exam was performed according to the departmental  dose-optimization program which includes automated exposure control, adjustment of the mA and/or kV  according to patient size and/or use of iterative reconstruction technique. COMPARISON:  None Available. FINDINGS: Orbits: No orbital mass or evidence of inflammation. Normal appearance of the globes, optic nerve-sheath complexes, extraocular muscles, orbital fat and lacrimal glands. Visible paranasal sinuses: Clear. Soft tissues: There is mild left supraorbital soft tissue edema. No abscess or drainable fluid collection. Osseous: No fracture or aggressive lesion. Limited intracranial: No acute or significant finding. IMPRESSION: Mild left supraorbital soft tissue edema without abscess or drainable fluid collection. Electronically Signed   By: Deatra Robinson M.D.   On: 06/23/2023 21:59   CT Head Wo Contrast  Result Date: 06/23/2023 CLINICAL DATA:  Headache EXAM: CT HEAD WITHOUT CONTRAST TECHNIQUE: Contiguous axial images were obtained from the base of the skull through the vertex without intravenous contrast. RADIATION DOSE REDUCTION: This exam was performed according to the departmental dose-optimization program which includes automated exposure control, adjustment of the mA and/or kV according to patient size and/or use of iterative reconstruction technique. COMPARISON:  None Available. FINDINGS: Brain: There is no mass, hemorrhage or extra-axial collection. The appearance of the white matter is normal for the patient's age. There is generalized atrophy. Vascular: No abnormal hyperdensity of the major intracranial arteries or dural venous sinuses. No intracranial atherosclerosis. Skull: The visualized skull base, calvarium and extracranial soft tissues are normal. Sinuses/Orbits: No fluid levels or advanced mucosal thickening of the visualized paranasal sinuses. No mastoid or middle ear effusion. The orbits are normal. IMPRESSION: Generalized atrophy without acute intracranial abnormality. Electronically Signed    By: Deatra Robinson M.D.   On: 06/23/2023 21:57   CT VENOGRAM HEAD  Result Date: 06/23/2023 CLINICAL DATA:  Headache EXAM: CT VENOGRAM HEAD TECHNIQUE: Venographic phase images of the brain were obtained following the administration of intravenous contrast. Multiplanar reformats and maximum intensity projections were generated. RADIATION DOSE REDUCTION: This exam was performed according to the departmental dose-optimization program which includes automated exposure control, adjustment of the mA and/or kV according to patient size and/or use of iterative reconstruction technique. CONTRAST:  75mL OMNIPAQUE IOHEXOL 350 MG/ML SOLN COMPARISON:  None Available. FINDINGS: Superior sagittal sinus: Normal. Straight sinus: Normal. Inferior sagittal sinus, vein of Galen and internal cerebral veins: Normal. Transverse sinuses: Normal. Sigmoid sinuses: Normal. Visualized jugular veins: Normal. IMPRESSION: No evidence of dural venous sinus thrombosis. Electronically Signed   By: Deatra Robinson M.D.   On: 06/23/2023 21:50    Pertinent labs & imaging results that were available during my care of the patient were reviewed by me and considered in my medical decision making (see MDM for details).  Medications Ordered in ED Medications  tetracaine (PONTOCAINE) 0.5 % ophthalmic solution 1-2 drop (has no administration in time range)  fluorescein ophthalmic strip 1 strip (has no administration in time range)  lactated ringers bolus 1,000 mL (has no administration in time range)  meropenem (MERREM) 1 g in sodium chloride 0.9 % 100 mL IVPB (has no administration in time range)  erythromycin ophthalmic ointment 1 Application (has no administration in time range)  acetaminophen (TYLENOL) tablet 1,000 mg (1,000 mg Oral Given 06/23/23 1836)  iohexol (OMNIPAQUE) 350 MG/ML injection 75 mL (75 mLs Intravenous Contrast Given 06/23/23 2144)  Procedures Procedures  (including critical care time)  Medical Decision Making / ED Course   MDM:  77 year old presenting to the emergency department with eye abnormality.  Patient has very significant chemosis and subconjunctival hemorrhage on exam.  He denies any trauma.  Unclear cause of this.  Visual acuity is reassuring.  Will obtain CT head, CT venogram given earlier headache.  Also obtain CT orbits to make sure he does not have an open globe but lower concern for this.  Given degree of chemosis likely discussed with ophthalmology.  Wife is also concerned about his recent fatigue and concerned he might have a urine infection so we will obtain laboratory test and urinalysis.  Clinical Course as of 06/23/23 2258  Wynelle Link Jun 23, 2023  2207 Erythromycin ointment TID.  [WS]  2254 Discussed with Dr. Dione Booze, recommends erythromycin ointment 3 times daily.  Patient will be admitted for UTI.  Does have history of ESBL UTI.  Has had multidrug-resistant urine cultures.  He has been on Levaquin without improvement and has been very sleepy per wife.  Discussed with the hospitalist to admit the patient. [WS]    Clinical Course User Index [WS] Lonell Grandchild, MD     Additional history obtained: -Additional history obtained from family -External records from outside source obtained and reviewed including: Chart review including previous notes, labs, imaging, consultation notes including prior urine cultures   Lab Tests: -I ordered, reviewed, and interpreted labs.   The pertinent results include:   Labs Reviewed  BASIC METABOLIC PANEL - Abnormal; Notable for the following components:      Result Value   Glucose, Bld 181 (*)    Creatinine, Ser 1.35 (*)    Calcium 8.7 (*)    GFR, Estimated 54 (*)    All other components within normal limits  URINALYSIS, W/ REFLEX TO CULTURE (INFECTION SUSPECTED) - Abnormal; Notable for the following components:    Leukocytes,Ua MODERATE (*)    Bacteria, UA FEW (*)    All other components within normal limits  URINE CULTURE  CBC WITH DIFFERENTIAL/PLATELET  PROTIME-INR    Notable for signs of UTI   EKG   EKG Interpretation Date/Time:  Sunday June 23 2023 17:20:07 EDT Ventricular Rate:  82 PR Interval:    QRS Duration:  117 QT Interval:  432 QTC Calculation: 344 R Axis:   -36  Text Interpretation: Normal sinus rhythm Ventricular bigeminy Incomplete left bundle branch block Confirmed by Alvino Blood ([id number]) on 06/23/2023 5:37:55 PM         Imaging Studies ordered: I ordered imaging studies including CT head, CT venogram brain, CT orbit On my interpretation imaging demonstrates no retrobulbar hematoma or open globe, no venous sinus thrombosis I independently visualized and interpreted imaging. I agree with the radiologist interpretation   Medicines ordered and prescription drug management: Meds ordered this encounter  Medications   tetracaine (PONTOCAINE) 0.5 % ophthalmic solution 1-2 drop   fluorescein ophthalmic strip 1 strip   acetaminophen (TYLENOL) tablet 1,000 mg   iohexol (OMNIPAQUE) 350 MG/ML injection 75 mL   lactated ringers bolus 1,000 mL   meropenem (MERREM) 1 g in sodium chloride 0.9 % 100 mL IVPB    Order Specific Question:   Antibiotic Indication:    Answer:   ESBL Infection   erythromycin ophthalmic ointment 1 Application    -I have reviewed the patients home medicines and have made adjustments as needed   Consultations Obtained: I requested consultation with the opthlamologist Dr. Dione Booze,  and discussed lab and imaging findings as well as pertinent plan - they recommend: erythromycin TID, seems to be just bad subconjunctival hemorrhage. HE will see patient tomorrow around lunch.    Cardiac Monitoring: The patient was maintained on a cardiac monitor.  I personally viewed and interpreted the cardiac monitored which showed an underlying rhythm of:  NSR  Reevaluation: After the interventions noted above, I reevaluated the patient and found that their symptoms have improved  Co morbidities that complicate the patient evaluation  Past Medical History:  Diagnosis Date   Abnormal involuntary movement 02/18/2018   Allergic rhinitis    Arthritis    BPH (benign prostatic hyperplasia)    Diabetes (HCC)    Dysphagia, pharyngeal phase    GERD diagnosed on barium swallow. Has small hiatal hernia. Symptomatically somewhat better on omeprazole but not entirely. We'll try b.i.d. therapy   Edema    1+ in both ankles, likely multifactorial including medication such as Requip   Erectile dysfunction    Staxyn 10 mg or Viagra worked well. 3 samples of Cialis 20 mg provided   GERD (gastroesophageal reflux disease)    Hypercholesteremia    Hypertension    Nephrolithiasis    Onychomycosis of toenail    April 27, 2013 - Dr. Merwyn Katos - podiatry, was in [place] - treating with oral Lamisil and topical nail therapy   Parkinson's disease     followed by Dr. Raquel Sarna at [facility] and Lesia Sago, M.D. in [place]   Presbycusis    and tinnitus - Dr. Serena Colonel - August/2013   Renal calculus    Syncope       Dispostion: Disposition decision including need for hospitalization was considered, and patient admitted to the hospital.    Final Clinical Impression(s) / ED Diagnoses Final diagnoses:  UTI due to extended-spectrum beta lactamase (ESBL) producing Escherichia coli  Chemosis, conjunctiva, left     This chart was dictated using voice recognition software.  Despite best efforts to proofread,  errors can occur which can change the documentation meaning.    Lonell Grandchild, MD 06/23/23 2258

## 2023-06-24 ENCOUNTER — Telehealth: Payer: Self-pay | Admitting: Cardiology

## 2023-06-24 DIAGNOSIS — I130 Hypertensive heart and chronic kidney disease with heart failure and stage 1 through stage 4 chronic kidney disease, or unspecified chronic kidney disease: Secondary | ICD-10-CM | POA: Diagnosis present

## 2023-06-24 DIAGNOSIS — E860 Dehydration: Secondary | ICD-10-CM | POA: Diagnosis present

## 2023-06-24 DIAGNOSIS — H11429 Conjunctival edema, unspecified eye: Secondary | ICD-10-CM

## 2023-06-24 DIAGNOSIS — N400 Enlarged prostate without lower urinary tract symptoms: Secondary | ICD-10-CM | POA: Diagnosis present

## 2023-06-24 DIAGNOSIS — R059 Cough, unspecified: Secondary | ICD-10-CM | POA: Diagnosis not present

## 2023-06-24 DIAGNOSIS — I2510 Atherosclerotic heart disease of native coronary artery without angina pectoris: Secondary | ICD-10-CM | POA: Diagnosis present

## 2023-06-24 DIAGNOSIS — G9340 Encephalopathy, unspecified: Secondary | ICD-10-CM | POA: Diagnosis not present

## 2023-06-24 DIAGNOSIS — G20A1 Parkinson's disease without dyskinesia, without mention of fluctuations: Secondary | ICD-10-CM

## 2023-06-24 DIAGNOSIS — E1165 Type 2 diabetes mellitus with hyperglycemia: Secondary | ICD-10-CM | POA: Diagnosis present

## 2023-06-24 DIAGNOSIS — Z7982 Long term (current) use of aspirin: Secondary | ICD-10-CM | POA: Diagnosis not present

## 2023-06-24 DIAGNOSIS — Z1152 Encounter for screening for COVID-19: Secondary | ICD-10-CM | POA: Diagnosis not present

## 2023-06-24 DIAGNOSIS — F32A Depression, unspecified: Secondary | ICD-10-CM | POA: Insufficient documentation

## 2023-06-24 DIAGNOSIS — H1132 Conjunctival hemorrhage, left eye: Secondary | ICD-10-CM | POA: Diagnosis present

## 2023-06-24 DIAGNOSIS — N1831 Chronic kidney disease, stage 3a: Secondary | ICD-10-CM | POA: Diagnosis present

## 2023-06-24 DIAGNOSIS — E1122 Type 2 diabetes mellitus with diabetic chronic kidney disease: Secondary | ICD-10-CM | POA: Diagnosis present

## 2023-06-24 DIAGNOSIS — B9620 Unspecified Escherichia coli [E. coli] as the cause of diseases classified elsewhere: Secondary | ICD-10-CM | POA: Diagnosis present

## 2023-06-24 DIAGNOSIS — E7800 Pure hypercholesterolemia, unspecified: Secondary | ICD-10-CM | POA: Diagnosis present

## 2023-06-24 DIAGNOSIS — Z888 Allergy status to other drugs, medicaments and biological substances status: Secondary | ICD-10-CM | POA: Diagnosis not present

## 2023-06-24 DIAGNOSIS — R5381 Other malaise: Secondary | ICD-10-CM

## 2023-06-24 DIAGNOSIS — I5042 Chronic combined systolic (congestive) and diastolic (congestive) heart failure: Secondary | ICD-10-CM | POA: Diagnosis present

## 2023-06-24 DIAGNOSIS — N390 Urinary tract infection, site not specified: Secondary | ICD-10-CM

## 2023-06-24 DIAGNOSIS — E1139 Type 2 diabetes mellitus with other diabetic ophthalmic complication: Secondary | ICD-10-CM

## 2023-06-24 DIAGNOSIS — B9629 Other Escherichia coli [E. coli] as the cause of diseases classified elsewhere: Secondary | ICD-10-CM

## 2023-06-24 DIAGNOSIS — H11422 Conjunctival edema, left eye: Secondary | ICD-10-CM | POA: Diagnosis present

## 2023-06-24 DIAGNOSIS — Z794 Long term (current) use of insulin: Secondary | ICD-10-CM | POA: Diagnosis not present

## 2023-06-24 DIAGNOSIS — Z885 Allergy status to narcotic agent status: Secondary | ICD-10-CM | POA: Diagnosis not present

## 2023-06-24 DIAGNOSIS — Z66 Do not resuscitate: Secondary | ICD-10-CM | POA: Diagnosis present

## 2023-06-24 DIAGNOSIS — Z955 Presence of coronary angioplasty implant and graft: Secondary | ICD-10-CM | POA: Diagnosis not present

## 2023-06-24 DIAGNOSIS — Z1612 Extended spectrum beta lactamase (ESBL) resistance: Secondary | ICD-10-CM | POA: Diagnosis present

## 2023-06-24 DIAGNOSIS — Z79899 Other long term (current) drug therapy: Secondary | ICD-10-CM | POA: Diagnosis not present

## 2023-06-24 DIAGNOSIS — G4700 Insomnia, unspecified: Secondary | ICD-10-CM | POA: Diagnosis present

## 2023-06-24 LAB — CBG MONITORING, ED
Glucose-Capillary: 163 mg/dL — ABNORMAL HIGH (ref 70–99)
Glucose-Capillary: 173 mg/dL — ABNORMAL HIGH (ref 70–99)
Glucose-Capillary: 199 mg/dL — ABNORMAL HIGH (ref 70–99)

## 2023-06-24 LAB — GLUCOSE, CAPILLARY
Glucose-Capillary: 154 mg/dL — ABNORMAL HIGH (ref 70–99)
Glucose-Capillary: 152 mg/dL — ABNORMAL HIGH (ref 70–99)

## 2023-06-24 LAB — BASIC METABOLIC PANEL
Anion gap: 6 (ref 5–15)
BUN: 25 mg/dL — ABNORMAL HIGH (ref 8–23)
CO2: 27 mmol/L (ref 22–32)
Calcium: 8.6 mg/dL — ABNORMAL LOW (ref 8.9–10.3)
Chloride: 105 mmol/L (ref 98–111)
Creatinine, Ser: 1.41 mg/dL — ABNORMAL HIGH (ref 0.61–1.24)
GFR, Estimated: 52 mL/min — ABNORMAL LOW (ref >60)
Glucose, Bld: 202 mg/dL — ABNORMAL HIGH (ref 70–99)
Potassium: 4.4 mmol/L (ref 3.5–5.1)
Sodium: 138 mmol/L (ref 135–145)

## 2023-06-24 LAB — TSH: TSH: 2.126 u[IU]/mL (ref 0.350–4.500)

## 2023-06-24 LAB — CBC
HCT: 44.4 % (ref 39.0–52.0)
Hemoglobin: 14.6 g/dL (ref 13.0–17.0)
MCH: 30.5 pg (ref 26.0–34.0)
MCHC: 32.9 g/dL (ref 30.0–36.0)
MCV: 92.9 fL (ref 80.0–100.0)
Platelets: 259 10*3/uL (ref 150–400)
RBC: 4.78 MIL/uL (ref 4.22–5.81)
RDW: 12.8 % (ref 11.5–15.5)
WBC: 6.4 10*3/uL (ref 4.0–10.5)
nRBC: 0 % (ref 0.0–0.2)

## 2023-06-24 LAB — SARS CORONAVIRUS 2 BY RT PCR: SARS Coronavirus 2 by RT PCR: NEGATIVE

## 2023-06-24 LAB — CREATININE, SERUM
Creatinine, Ser: 1.3 mg/dL — ABNORMAL HIGH (ref 0.61–1.24)
GFR, Estimated: 57 mL/min — ABNORMAL LOW (ref >60)

## 2023-06-24 LAB — MAGNESIUM: Magnesium: 2.2 mg/dL (ref 1.7–2.4)

## 2023-06-24 MED ORDER — METHENAMINE MANDELATE 0.5 G PO TABS
1000.0000 mg | ORAL_TABLET | Freq: Two times a day (BID) | ORAL | Status: DC
  Administered 2023-06-24 – 2023-06-25 (×2): 1000 mg via ORAL
  Filled 2023-06-24 (×3): qty 2

## 2023-06-24 MED ORDER — HEPARIN SODIUM (PORCINE) 5000 UNIT/ML IJ SOLN: 5000.0000 [IU] | Freq: Three times a day (TID) | INTRAMUSCULAR | Status: DC

## 2023-06-24 MED ORDER — ROPINIROLE HCL 1 MG PO TABS: 3.0000 mg | ORAL_TABLET | Freq: Every day | ORAL | Status: DC

## 2023-06-24 MED ORDER — CARBIDOPA-LEVODOPA 25-100 MG PO TABS: 3.0000 | ORAL_TABLET | ORAL | Status: DC

## 2023-06-24 MED ORDER — ROPINIROLE HCL 1 MG PO TABS
1.5000 mg | ORAL_TABLET | Freq: Every day | ORAL | Status: DC
  Administered 2023-06-24: 1.5 mg via ORAL
  Filled 2023-06-24: qty 2

## 2023-06-24 MED ORDER — INSULIN GLARGINE-YFGN 100 UNIT/ML ~~LOC~~ SOLN
8.0000 [IU] | Freq: Every day | SUBCUTANEOUS | Status: DC
  Administered 2023-06-24: 8 [IU] via SUBCUTANEOUS
  Filled 2023-06-24 (×2): qty 0.08

## 2023-06-24 MED ORDER — LACTATED RINGERS IV BOLUS
1000.0000 mL | Freq: Once | INTRAVENOUS | Status: AC
  Administered 2023-06-24: 1000 mL via INTRAVENOUS

## 2023-06-24 MED ORDER — FUROSEMIDE 40 MG PO TABS
40.0000 mg | ORAL_TABLET | Freq: Every day | ORAL | Status: DC
  Administered 2023-06-24: 40 mg via ORAL
  Filled 2023-06-24: qty 2

## 2023-06-24 MED ORDER — CARBIDOPA-LEVODOPA 25-100 MG PO TABS
3.0000 | ORAL_TABLET | ORAL | Status: DC
  Administered 2023-06-24 – 2023-06-25 (×8): 3 via ORAL
  Filled 2023-06-24 (×9): qty 3

## 2023-06-24 MED ORDER — ROSUVASTATIN CALCIUM 20 MG PO TABS
40.0000 mg | ORAL_TABLET | Freq: Every day | ORAL | Status: DC
  Administered 2023-06-24 – 2023-06-25 (×2): 40 mg via ORAL
  Filled 2023-06-24 (×2): qty 2

## 2023-06-24 MED ORDER — TAMSULOSIN HCL 0.4 MG PO CAPS
0.4000 mg | ORAL_CAPSULE | Freq: Every day | ORAL | Status: DC
  Administered 2023-06-24: 0.4 mg via ORAL
  Filled 2023-06-24: qty 1

## 2023-06-24 MED ORDER — ROPINIROLE HCL 1 MG PO TABS
3.0000 mg | ORAL_TABLET | Freq: Two times a day (BID) | ORAL | Status: DC
  Administered 2023-06-24 – 2023-06-25 (×3): 3 mg via ORAL
  Filled 2023-06-24 (×3): qty 3

## 2023-06-24 MED ORDER — INSULIN ASPART 100 UNIT/ML IJ SOLN
0.0000 [IU] | Freq: Three times a day (TID) | INTRAMUSCULAR | Status: DC
  Administered 2023-06-24: 2 [IU] via SUBCUTANEOUS

## 2023-06-24 MED ORDER — METOPROLOL SUCCINATE ER 25 MG PO TB24
25.0000 mg | ORAL_TABLET | Freq: Every day | ORAL | Status: DC
  Administered 2023-06-24 – 2023-06-25 (×2): 25 mg via ORAL
  Filled 2023-06-24 (×2): qty 1

## 2023-06-24 MED ORDER — ACETAMINOPHEN 650 MG RE SUPP: 650.0000 mg | Freq: Four times a day (QID) | RECTAL | Status: DC | PRN

## 2023-06-24 MED ORDER — INSULIN GLARGINE-YFGN 100 UNIT/ML ~~LOC~~ SOLN
15.0000 [IU] | Freq: Every day | SUBCUTANEOUS | Status: DC
  Administered 2023-06-24: 15 [IU] via SUBCUTANEOUS
  Filled 2023-06-24 (×2): qty 0.15

## 2023-06-24 MED ORDER — CALCIUM CARBONATE ANTACID 500 MG PO CHEW: 2.0000 | CHEWABLE_TABLET | Freq: Every day | ORAL | Status: DC | PRN

## 2023-06-24 MED ORDER — ACETAMINOPHEN 500 MG PO TABS: 500.0000 mg | ORAL_TABLET | Freq: Four times a day (QID) | ORAL | Status: DC | PRN

## 2023-06-24 MED ORDER — SACUBITRIL-VALSARTAN 24-26 MG PO TABS
1.0000 | ORAL_TABLET | Freq: Two times a day (BID) | ORAL | Status: DC
  Administered 2023-06-24 – 2023-06-25 (×3): 1 via ORAL
  Filled 2023-06-24 (×4): qty 1

## 2023-06-24 MED ORDER — VITAMIN B-12 1000 MCG PO TABS
2000.0000 ug | ORAL_TABLET | Freq: Every day | ORAL | Status: DC
  Administered 2023-06-24 – 2023-06-25 (×2): 2000 ug via ORAL
  Filled 2023-06-24 (×2): qty 2

## 2023-06-24 MED ORDER — CLOPIDOGREL BISULFATE 75 MG PO TABS
75.0000 mg | ORAL_TABLET | Freq: Every day | ORAL | Status: DC
  Administered 2023-06-24: 75 mg via ORAL
  Filled 2023-06-24: qty 1

## 2023-06-24 MED ORDER — ONDANSETRON HCL 4 MG/2ML IJ SOLN: 4.0000 mg | Freq: Four times a day (QID) | INTRAMUSCULAR | Status: DC | PRN

## 2023-06-24 MED ORDER — SERTRALINE HCL 50 MG PO TABS
50.0000 mg | ORAL_TABLET | Freq: Every day | ORAL | Status: DC
  Administered 2023-06-25: 50 mg via ORAL
  Filled 2023-06-24: qty 1

## 2023-06-24 MED ORDER — ASPIRIN 81 MG PO TBEC
81.0000 mg | DELAYED_RELEASE_TABLET | Freq: Every day | ORAL | Status: DC
  Administered 2023-06-25: 81 mg via ORAL
  Filled 2023-06-24: qty 1

## 2023-06-24 MED ORDER — CLOPIDOGREL BISULFATE 75 MG PO TABS
75.0000 mg | ORAL_TABLET | Freq: Every day | ORAL | Status: DC
  Administered 2023-06-25: 75 mg via ORAL
  Filled 2023-06-24: qty 1

## 2023-06-24 MED ORDER — ENTACAPONE 200 MG PO TABS
200.0000 mg | ORAL_TABLET | Freq: Three times a day (TID) | ORAL | Status: DC
  Administered 2023-06-25 (×3): 200 mg via ORAL
  Filled 2023-06-24 (×3): qty 1

## 2023-06-24 MED ORDER — METHENAMINE HIPPURATE 1 G PO TABS: 1.0000 g | ORAL_TABLET | Freq: Two times a day (BID) | ORAL | Status: DC

## 2023-06-24 MED ORDER — ONDANSETRON HCL 4 MG PO TABS
4.0000 mg | ORAL_TABLET | Freq: Four times a day (QID) | ORAL | Status: DC | PRN
  Filled 2023-06-24: qty 1

## 2023-06-24 MED ORDER — INSULIN ASPART 100 UNIT/ML IJ SOLN
0.0000 [IU] | Freq: Three times a day (TID) | INTRAMUSCULAR | Status: DC
  Administered 2023-06-24 (×3): 1 [IU] via SUBCUTANEOUS

## 2023-06-24 MED ORDER — INSULIN ASPART 100 UNIT/ML IJ SOLN: 0.0000 [IU] | Freq: Every day | INTRAMUSCULAR | Status: DC

## 2023-06-24 MED ORDER — POTASSIUM CHLORIDE CRYS ER 10 MEQ PO TBCR
10.0000 meq | EXTENDED_RELEASE_TABLET | Freq: Two times a day (BID) | ORAL | Status: DC
  Administered 2023-06-24 – 2023-06-25 (×3): 10 meq via ORAL
  Filled 2023-06-24 (×3): qty 1

## 2023-06-24 NOTE — Assessment & Plan Note (Addendum)
Appears euvolemic to dehydrated. - Hold furosemide -Continue metoprolol, Sherryll Burger

## 2023-06-24 NOTE — Discharge Planning (Signed)
Pt currently active with [facility] for Home Health services PT as confirmed by [facility] with Tresa Endo of [facility].  Pt will resume HH services with HHRN added. No DME needs identified at this time.

## 2023-06-24 NOTE — Assessment & Plan Note (Signed)
Baseline 1.2-1.3

## 2023-06-24 NOTE — Consult Note (Signed)
Ophthalmology Initial Consult Note  [name], [name], 77 y.o. male Date of Service:  06/24/23  Requesting physician: Alberteen Sam, *  Information Obtained from: Patient Chief Complaint:  Bloody left eye  HPI/Discussion:  [name] is a 77 y.o. male who is currently admitted at [facility] with blood around the left eye. Vision is reduced a bit. No pain. No double vision. Denies trauma. No coughing.  Past Ocular Hx:  CEIOL OU ([place]) Ocular Meds:  None  Family ocular history: None pertinent  Past Medical History:  Diagnosis Date   Abnormal involuntary movement 02/18/2018   Allergic rhinitis    Arthritis    BPH (benign prostatic hyperplasia)    Diabetes (HCC)    Dysphagia, pharyngeal phase    GERD diagnosed on barium swallow. Has small hiatal hernia. Symptomatically somewhat better on omeprazole but not entirely. We'll try b.i.d. therapy   Edema    1+ in both ankles, likely multifactorial including medication such as Requip   Erectile dysfunction    Staxyn 10 mg or Viagra worked well. 3 samples of Cialis 20 mg provided   GERD (gastroesophageal reflux disease)    Hypercholesteremia    Hypertension    Nephrolithiasis    Onychomycosis of toenail    April 27, 2013 - Dr. Merwyn Katos - podiatry, was in [place] - treating with oral Lamisil and topical nail therapy   Parkinson's disease     followed by Dr. Raquel Sarna at [facility] and Lesia Sago, M.D. in [place]   Presbycusis    and tinnitus - Dr. Serena Colonel - August/2013   Renal calculus    Syncope    Past Surgical History:  Procedure Laterality Date   CORONARY STENT INTERVENTION N/A 01/11/2023   Procedure: CORONARY STENT INTERVENTION;  Surgeon: Kathleene Hazel, MD;  Location: [facility];  Service: Cardiovascular;  Laterality: N/A;   HAND SURGERY     INGUINAL HERNIA REPAIR Left 11/04/2015   Procedure: LEFT INGUINAL HERNIA REPAIR WITH MESH;  Surgeon: Darnell Level, MD;  Location: [facility];  Service:  General;  Laterality: Left;   INSERTION OF MESH Left 11/04/2015   Procedure: INSERTION OF MESH;  Surgeon: Darnell Level, MD;  Location: [facility];  Service: General;  Laterality: Left;   JOINT REPLACEMENT Bilateral    KNEE SURGERY     LEFT HEART CATH AND CORONARY ANGIOGRAPHY N/A 01/11/2023   Procedure: LEFT HEART CATH AND CORONARY ANGIOGRAPHY;  Surgeon: Kathleene Hazel, MD;  Location: [facility];  Service: Cardiovascular;  Laterality: N/A;   TOTAL KNEE ARTHROPLASTY      Prior to Admission Meds: Medications Prior to Admission  Medication Sig Dispense Refill Last Dose   acetaminophen (TYLENOL) 500 MG tablet Take 500-1,000 mg by mouth every 6 (six) hours as needed (for pain).   06/22/2023   aspirin EC 81 MG tablet Take 81 mg by mouth daily. Swallow whole.   06/23/2023   calcium carbonate (TUMS) 500 MG chewable tablet Chew 2 tablets by mouth daily as needed for indigestion or heartburn.   unknown   carbidopa-levodopa (SINEMET IR) 25-100 MG tablet TAKE 3 TABLETS BY MOUTH AT 7:00 AM, 3 TABS AT 10:00 AM, 3 TABS AT 12:00 PM, 3 TABS AT 2:00 PM, 3 TABS AT 4:00, AND 3 TABS AT 6:00 PM (Patient taking differently: Take 3 tablets by mouth 6 (six) times daily. TAKE 3 TABLETS BY MOUTH AT 7:00 AM, 3 TABS AT 10:00 AM, 3 TABS AT  12:00 PM, 3 TABS AT 2:00 PM, 3 TABS AT 4:00, AND 3 TABS AT 6:00 PM) 1620 tablet 0 06/22/2023 at 1930   Cholecalciferol (VITAMIN D) 50 MCG (2000 UT) tablet Take 2,000 Units by mouth daily.   06/23/2023   clopidogrel (PLAVIX) 75 MG tablet Take 1 tablet (75 mg total) by mouth daily with breakfast. 90 tablet 2 06/22/2023   entacapone (COMTAN) 200 MG tablet TAKE 1 TABLET BY MOUTH 3 TIMES DAILY (WITH FIRST THREE DOSAGES OF CARBIDOPA/LEVODOPA). 270 tablet 0 06/22/2023   ENTRESTO 24-26 MG TAKE 1 TABLET BY MOUTH 2 TIMES DAILY. 60 tablet 6 06/22/2023   furosemide (LASIX) 20 MG tablet Take 2 tablets (40 mg total) by mouth daily. (Patient taking differently: Take 20 mg by mouth 2 (two)  times daily.) 180 tablet 3 06/22/2023   insulin lispro (HUMALOG) 100 UNIT/ML KwikPen Inject 2-15 Units into the skin 4 (four) times daily -  before meals and at bedtime. CBG 121 - 150: 2 units, CBG 151 - 200: 3 units, CBG 201 - 250: 5 units, CBG 251 - 300: 8 units, CBG 301 - 350: 11 units, CBG 351 - 400: 15 units (Patient taking differently: Inject 2-3 Units into the skin 4 (four) times daily -  before meals and at bedtime. CBG Sliding Scale) 15 mL 11 06/22/2023   LANTUS SOLOSTAR 100 UNIT/ML Solostar Pen Inject 25 Units into the skin at bedtime. 15 mL 11 06/22/2023   levofloxacin (LEVAQUIN) 500 MG tablet Take 500 mg by mouth daily.   06/22/2023   methenamine (HIPREX) 1 g tablet TAKE 1 TABLET BY MOUTH 2 TIMES DAILY. TAKE WITH ANY VITAMIN C, TWICE A DAY FOR UTI PREVENTION (Patient taking differently: Take 1 g by mouth 2 (two) times daily with a meal.) 180 tablet 3 06/22/2023   metoprolol succinate (TOPROL-XL) 50 MG 24 hr tablet Take 1 tablet (50 mg total) by mouth daily. Take with or immediately following a meal. 90 tablet 3 06/22/2023   pantoprazole (PROTONIX) 40 MG tablet Take 1 tablet (40 mg total) by mouth daily. 30 tablet 1 06/22/2023   potassium chloride (KLOR-CON) 10 MEQ tablet Take 10 mEq by mouth 2 (two) times daily.   06/22/2023   rOPINIRole (REQUIP) 3 MG tablet Take 1.5-3 mg by mouth at bedtime. Take 1 in the morning, Take 1 in afternoon and Take 1/2 tablet in the evening   06/22/2023   rosuvastatin (CRESTOR) 40 MG tablet Take 1 tablet (40 mg total) by mouth daily. 90 tablet 2 06/22/2023   sertraline (ZOLOFT) 50 MG tablet Take 50 mg by mouth daily.   06/22/2023   tamsulosin (FLOMAX) 0.4 MG CAPS capsule Take 0.4 mg by mouth in the morning and at bedtime.  11 06/22/2023   triamcinolone (NASACORT) 55 MCG/ACT AERO nasal inhaler Place 2 sprays into the nose daily as needed (for allergies or rhinitis).   unknown   vitamin B-12 (CYANOCOBALAMIN) 1000 MCG tablet Take 2,000 mcg by mouth daily.   06/23/2023   Continuous Glucose  Sensor (DEXCOM G7 SENSOR) MISC 1 each by Does not apply route as needed. 1 each 5    GLOBAL EASE INJECT PEN NEEDLES 31G X 5 MM MISC Inject into the skin.      Levodopa (INBRIJA) 42 MG CAPS Samples of this drug were given to the patient, quantity 1, Lot Number [id number] exp 12-24 (Patient not taking: Reported on 06/24/2023) 60 capsule 0 Not Taking   nitroGLYCERIN (NITROSTAT) 0.4 MG SL tablet Place 1 tablet (0.4 mg  total) under the tongue every 5 (five) minutes as needed. 25 tablet 2      Allergies  Allergen Reactions   Nucynta [Tapentadol] Other (See Comments)    Made the patient not feel well. "He did not feel well at all."   Oxycodone Itching   Sudafed [Pseudoephedrine Hcl] Other (See Comments)    Problems urinating    Tramadol Hcl Itching and Other (See Comments)    Can tolerate, however (in 2023)   Social History   Tobacco Use   Smoking status: Never   Smokeless tobacco: Never  Substance Use Topics   Alcohol use: No   Family History  Problem Relation Age of Onset   Atrial fibrillation Mother    Breast cancer Mother    Emphysema Father    Heart disease Father    Cancer Brother        African [place]    ROS: Other than ROS in the HPI, all other systems were negative.  Exam: Temp: 98.3 F (36.8 C) Pulse Rate: (!) 57 BP: 131/72 Resp: 18 SpO2: 96 %  Visual Acuity:    cc  ph  near   OD  +     20/30   OS  +     20/50     OD OS  Confr Vis Fields WNL WNL  EOM (Primary) WNL WNL  Lids/Lashes WNL WNL  Conjunctiva - Bulbar WNL Bullous SCH  Conjunctiva - Palpebral               WNL WNL  Adnexa  WNL Periorbital ecchymosis; No proptosis  Pupils  RR RR  Reaction, Direct Brisk Brisk                 Consensual Brisk Brisk                 RAPD No No  Cornea  Clear Clear  Anterior Chamber Deep Deep  Lens:  PCIOL PCIOL  IOP 14 11  Fundus - Dilated? Yes   Optic Disc - C:D Ratio 0.2 0.2                     Appearance  WNL WNL                     NF Layer WNL WNL   Post Seg:  Retina                    Vessels WNL WNL                  Vitreous  WNL WNL                  Macula WNL WNL                  Periphery WNL WNL       Neuro:  Oriented to person, place, and time:  Yes Psychiatric:  Mood and Affect Appropriate:  Yes  Labs/imaging:   A/P:  77 y.o. 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

## 2023-06-24 NOTE — Assessment & Plan Note (Signed)
Blood pressure 130s - Continue home Entresto, metoprolol

## 2023-06-24 NOTE — Assessment & Plan Note (Addendum)
Patient has recurrent UTI.  His typical presenting symptoms are not urinary irritative and unfortunately have considerable overlap with symptoms expected in a 77 y.o. M with advancing Parkinson's disease, severe coronary disease with HFrEF, depression, poorly controlled diabetes, and day-night reversal.  All three of the last infectious disease specialists he has seen recommend restraining the use of antibiotics, acknowledging the challenge presented by his typical symptomology.  At present, he has no systemic symptoms I can definitely ascribe to infection, nor does he have any culture data to support it. - Hold antibiotics -Continue prophylactic methenamine for now -continue tamsulosin - Monitor WBC, procal, fever curve - Follow urine culture - I have spoken with Dr. Renold Don

## 2023-06-24 NOTE — Inpatient Diabetes Management (Signed)
Inpatient Diabetes Program Recommendations  AACE/ADA: New Consensus Statement on Inpatient Glycemic Control (2015)  Target Ranges:  Prepandial:   less than 140 mg/dL      Peak postprandial:   less than 180 mg/dL (1-2 hours)      Critically ill patients:  140 - 180 mg/dL   Lab Results  Component Value Date   GLUCAP 199 (H) 06/24/2023   HGBA1C 11.7 (H) 04/02/2023    Review of Glycemic Control  Latest Reference Range & Units 06/24/23 02:13 06/24/23 07:41 06/24/23 13:35  Glucose-Capillary 70 - 99 mg/dL 829 (H) 562 (H) 130 (H)   Diabetes history: DM 2 Outpatient Diabetes medications: Lantus 10-12 units, Humalog SSI before the meals Current orders for Inpatient glycemic control:  Novolog 0-6 units tid Semglee 8 units qhs  A1c 11.7 on 6/18  Went to see pt, wife, and daughter at bedside regarding questions they had about the CGMs. Pt currently has the Dexcom G7 and has also used the FSL3 in the past. Wife was concerned due to the inaccuracy of the sensor at times and the alarms going off. I reviewed the technologies of the CGMs and that they are never going to be 100% accurate. The alarms will go off if the glucose readings are outside of the target range.  If the sensor readings are very off consider changing sensor. Also consider how sick the pt is with hydration status and clinically what is going on to have the alarms keep going off. I gave pt an extra sensor in case they desire to change out the current sensor.  Thanks,  Christena Deem RN, MSN, BC-ADM Inpatient Diabetes Coordinator Team Pager [phone] (8a-5p)

## 2023-06-24 NOTE — Plan of Care (Signed)
  Problem: Education: Goal: Ability to describe self-care measures that may prevent or decrease complications (Diabetes Survival Skills Education) will improve Outcome: Progressing Goal: Individualized Educational Video(s) Outcome: Progressing   Problem: Coping: Goal: Ability to adjust to condition or change in health will improve Outcome: Progressing   Problem: Fluid Volume: Goal: Ability to maintain a balanced intake and output will improve Outcome: Progressing   Problem: Health Behavior/Discharge Planning: Goal: Ability to identify and utilize available resources and services will improve Outcome: Progressing Goal: Ability to manage health-related needs will improve Outcome: Progressing   Problem: Metabolic: Goal: Ability to maintain appropriate glucose levels will improve Outcome: Progressing   Problem: Nutritional: Goal: Maintenance of adequate nutrition will improve Outcome: Progressing Goal: Progress toward achieving an optimal weight will improve Outcome: Progressing   Problem: Skin Integrity: Goal: Risk for impaired skin integrity will decrease Outcome: Progressing   Problem: Tissue Perfusion: Goal: Adequacy of tissue perfusion will improve Outcome: Progressing   Problem: Education: Goal: Understanding of CV disease, CV risk reduction, and recovery process will improve Outcome: Progressing Goal: Individualized Educational Video(s) Outcome: Progressing   Problem: Activity: Goal: Ability to return to baseline activity level will improve Outcome: Progressing   Problem: Cardiovascular: Goal: Ability to achieve and maintain adequate cardiovascular perfusion will improve Outcome: Progressing Goal: Vascular access site(s) Level 0-1 will be maintained Outcome: Progressing   Problem: Health Behavior/Discharge Planning: Goal: Ability to safely manage health-related needs after discharge will improve Outcome: Progressing   

## 2023-06-24 NOTE — ED Notes (Signed)
Provider at bedside

## 2023-06-24 NOTE — Progress Notes (Signed)
  Progress Note   Patient: [name] ONG:[id number] DOB: 02/18/1946 DOA: 06/23/2023     0 DOS: the patient was seen and examined on 06/24/2023        Brief hospital course: Mr. Vitiello is a 77 y.o. M with Parkinson's disease, CAD s/p PCI x4 last Mar, sCHF EF 45%, depression, HTN, DM, and chronic urinary retention and recurrent ESBL UTI who presented with bloody eye for 1 day, in the setting of progressive fatigue, malaise, and sluggishness.     Assessment and Plan: * Malaise, possible ESBL Klebsiella UTI Patient has recurrent UTI.  His typical presenting symptoms are not urinary irritative and unfortunately have considerable overlap with symptoms expected in a 77 y.o. M with advancing Parkinson's disease, severe coronary disease with HFrEF, depression, poorly controlled diabetes, and day-night reversal.  All three of the last infectious disease specialists he has seen recommend restraining the use of antibiotics, acknowledging the challenge presented by his typical symptomology.  At present, he has no systemic symptoms I can definitely ascribe to infection, nor does he have any culture data to support it. - Hold antibiotics -Continue prophylactic methenamine for now -continue tamsulosin - Monitor WBC, procal, fever curve - Follow urine culture - I have spoken with Dr. Renold Don    Chemosis Initial presenting complaint.  This does appears to have some hemorrhagic component.  However it is not a typical subconjunctival hemorrhage, and I have reached out to Dr. Dione [facility] for evaluation, assistance appreciated.  Given his recent stent, I do not yet feel comfortable holding Plavix. - Consult Optho    Depression - Continue sertraline  Coronary artery disease involving native coronary 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 angina pectoris S/p PCI just 5 months ago - Continue aspirin and Plavix - Continue Crestor, metoprolol, ARB  Chronic combined systolic and diastolic heart failure  (HCC) Appears euvolemic to dehydrated. - Hold furosemide -Continue metoprolol, Entresto  CKD (chronic kidney disease), stage IIIa (HCC) Baseline 1.2-1.3  HTN (hypertension) Blood pressure 130s - Continue home Entresto, metoprolol  Diabetes mellitus with coincident hypertension (HCC) Hemoglobin A1c 11% in June.  Glucoses here high normal, low 200s. - Continue home glargine - Continue sliding scale corrections  Parkinson disease (HCC) - Continue home Sinemet regimen - Continue entacapone, ropinirole            Physical Exam: BP 131/72 (BP Location: Left Arm)   Pulse (!) 57   Temp 98.3 F (36.8 C) (Oral)   Resp 18   Ht 5\' 8"  (1.727 m)   Wt 81.6 kg   SpO2 96%   BMI 27.37 kg/m   Patient seen and examined.  Exam benign.  Appears tired.  Marked chemosis and subconjunctival hemorrhage. Some discharge from the eye on my first exam, less on return later this afternoon.  Some discoloration of the eye lid but certainly no periorbital infection.  Abdomen exam benign.  Lung, CV exam not consistent with CHF exacerbation. This is a no charge note, for further details, please see   Data Reviewed: Extensive.  Family Communication: Daughter at bedside    Disposition: Status is: Inpatient         Author: Alberteen Sam, MD 06/24/2023 5:19 PM  For on call review [email or web address].

## 2023-06-24 NOTE — Telephone Encounter (Signed)
Spoke with both wife and a daughter and advised of Dr Anne Fu' order to continue Plavix for now.

## 2023-06-24 NOTE — Hospital Course (Addendum)
Mr. [name] is a 77 y.o. M with Parkinson's disease, CAD s/p PCI x4 last Mar, sCHF EF 45%, depression, HTN, DM, and chronic urinary retention and recurrent ESBL UTI who presented with bloody eye for 1 day, in the setting of progressive fatigue, malaise, and sluggishness.

## 2023-06-24 NOTE — Assessment & Plan Note (Addendum)
Initial presenting complaint.  This does appears to have some hemorrhagic component.  However it is not a typical subconjunctival hemorrhage, and I have reached out to Dr. Dione [facility]oo[facility] for evaluation, assistance appreciated.  Given his recent stent, I do not yet feel comfortable holding Plavix. - Consult Optho

## 2023-06-24 NOTE — Assessment & Plan Note (Signed)
-   Continue home Sinemet regimen - Continue entacapone, ropinirole

## 2023-06-24 NOTE — H&P (Addendum)
History and Physical    Patient: [name]:[id number] DOB: Feb 25, 1946 DOA: 06/23/2023 DOS: the patient was seen and examined on 06/24/2023 PCP: Eric Aspen, MD  Patient coming from: Home  Chief Complaint:  Chief Complaint  Patient presents with   Eye Problem   Blurred Vision   Headache   HPI: [name] is a 77 y.o. male with medical history significant for ESBL E. coli UTI, hyperlipidemia, Parkinson's disease, type 2 diabetes mellitus, hypertension, and osteoporosis who was brought in today because the patient woke up with a beet red eye. He says he felt like there was a rock in his eye and when his wife looked at it she was alarmed.  It was not like that before he went to bed.  There is no known trauma.  There was some discharge coming from the corners of his eye. In addition to the dramatic redness of the eye the patient has been having trouble for weeks with sleeping a lot and poor appetite.  These are symptoms the wife usually attributes to UTIs which he has had multiple times.  The patient had an appointment with infectious disease 4 days ago but he missed that because he was sleeping the wife could not get him up.  He also follows with urology.  He was last hospitalized 2 months ago and at that time he also did have a UTI.  He was discharged on cefadroxil and has been on antibiotics almost since discharge.  He is currently on Levaquin.  The patient's eye is what brought him to the emergency room acutely today but he was "heading here" anyway with his severe lethargy. In the emergency department the ER doctor discussed the case with ophthalmology.  The patient had a CT of his orbits and a head CT which were unremarkable.  Erythromycin eye drops were recommended for tonight and the patient will be seen tomorrow.   Review of Systems: As mentioned in the history of present illness. All other systems reviewed and are negative. Past Medical History:  Diagnosis Date   Abnormal  involuntary movement 02/18/2018   Allergic rhinitis    Arthritis    BPH (benign prostatic hyperplasia)    Diabetes (HCC)    Dysphagia, pharyngeal phase    GERD diagnosed on barium swallow. Has small hiatal hernia. Symptomatically somewhat better on omeprazole but not entirely. We'll try Le.i.d. therapy   Edema    1+ in both ankles, likely multifactorial including medication such as Requip   Erectile dysfunction    Staxyn 10 mg or Viagra worked well. 3 samples of Cialis 20 mg provided   GERD (gastroesophageal reflux disease)    Hypercholesteremia    Hypertension    Nephrolithiasis    Onychomycosis of toenail    April 27, 2013 - Dr. Merwyn Le - podiatry, was in [place] - treating with oral Lamisil and topical nail therapy   Parkinson's disease     followed by Dr. Raquel Le at [facility] and [name], M.D. in [place]   Presbycusis    and tinnitus - Dr. Serena Le - August/2013   Renal calculus    Syncope    Past Surgical History:  Procedure Laterality Date   CORONARY STENT INTERVENTION N/A 01/11/2023   Procedure: CORONARY STENT INTERVENTION;  Surgeon: Eric Hazel, MD;  Location: [facility];  Service: Cardiovascular;  Laterality: N/A;   HAND SURGERY     INGUINAL HERNIA REPAIR Left 11/04/2015   Procedure: LEFT INGUINAL  HERNIA REPAIR WITH MESH;  Surgeon: Eric Level, MD;  Location: [facility];  Service: General;  Laterality: Left;   INSERTION OF MESH Left 11/04/2015   Procedure: INSERTION OF MESH;  Surgeon: Eric Level, MD;  Location: [facility];  Service: General;  Laterality: Left;   JOINT REPLACEMENT Bilateral    KNEE SURGERY     LEFT HEART CATH AND CORONARY ANGIOGRAPHY N/A 01/11/2023   Procedure: LEFT HEART CATH AND CORONARY ANGIOGRAPHY;  Surgeon: Eric Hazel, MD;  Location: [facility];  Service: Cardiovascular;  Laterality: N/A;   TOTAL KNEE ARTHROPLASTY     Social History:  reports that he has never smoked. He  has never used smokeless tobacco. He reports that he does not drink alcohol and does not use drugs.  Allergies  Allergen Reactions   Nucynta [Tapentadol] Other (See Comments)    Made the patient not feel well. "He did not feel well at all."   Oxycodone Itching   Sudafed [Pseudoephedrine Hcl] Other (See Comments)    Problems urinating    Tramadol Hcl Itching and Other (See Comments)    Can tolerate, however (in 2023)    Family History  Problem Relation Age of Onset   Atrial fibrillation Mother    Breast cancer Mother    Emphysema Father    Heart disease Father    Cancer Brother        African [place]    Prior to Admission medications   Medication Sig Start Date End Date Taking? Authorizing Provider  acetaminophen (TYLENOL) 500 MG tablet Take 500-1,000 mg by mouth every 6 (six) hours as needed (for pain).    [provider]  Ascorbic Acid (VITAMIN C) 100 MG tablet Take 100 mg by mouth in the morning and at bedtime.    [provider]  aspirin EC 81 MG tablet Take 81 mg by mouth daily. Swallow whole.    [provider]  calcium carbonate (TUMS) 500 MG chewable tablet Chew 2 tablets by mouth daily as needed for indigestion or heartburn.    [provider]  carbidopa-levodopa (SINEMET IR) 25-100 MG tablet TAKE 3 TABLETS BY MOUTH AT 7:00 AM, 3 TABS AT 10:00 AM, 3 TABS AT 12:00 PM, 3 TABS AT 2:00 PM, 3 TABS AT 4:00, AND 3 TABS AT 6:00 PM Patient taking differently: Take 3 tablets by mouth 6 (six) times daily. TAKE 3 TABLETS BY MOUTH AT 7:00 AM, 3 TABS AT 10:00 AM, 3 TABS AT 12:00 PM, 3 TABS AT 2:00 PM, 3 TABS AT 4:00, AND 3 TABS AT 6:00 PM 02/05/23   [name], Eric Batty, DO  Cholecalciferol (VITAMIN D) 50 MCG (2000 UT) tablet Take 2,000 Units by mouth daily.    [provider]  clopidogrel (PLAVIX) 75 MG tablet Take 1 tablet (75 mg total) by mouth daily with breakfast. 01/12/23   Eric Page B, NP  Continuous Glucose Sensor (DEXCOM G7 SENSOR) MISC  1 each by Does not apply route as needed. 04/04/23   Eric Chamber, MD  entacapone (COMTAN) 200 MG tablet TAKE 1 TABLET BY MOUTH 3 TIMES DAILY (WITH FIRST THREE DOSAGES OF CARBIDOPA/LEVODOPA). 06/04/23   [name], Rebecca S, DO  ENTRESTO 24-26 MG TAKE 1 TABLET BY MOUTH 2 TIMES DAILY. 02/11/23   Jake Bathe, MD  furosemide (LASIX) 20 MG tablet Take 2 tablets (40 mg total) by mouth daily. 01/02/23   Duke Salvia, MD  GLOBAL EASE INJECT PEN NEEDLES 31G X 5  MM MISC Inject into the skin. 05/09/21   [provider]  ibuprofen (ADVIL) 200 MG tablet Take 400 mg by mouth daily.    [provider]  insulin lispro (HUMALOG) 100 UNIT/ML KwikPen Inject 2-15 Units into the skin 4 (four) times daily -  before meals and at bedtime. CBG 121 - 150: 2 units, CBG 151 - 200: 3 units, CBG 201 - 250: 5 units, CBG 251 - 300: 8 units, CBG 301 - 350: 11 units, CBG 351 - 400: 15 units 04/04/23   Eric Chamber, MD  LANTUS SOLOSTAR 100 UNIT/ML Solostar Pen Inject 25 Units into the skin at bedtime. 04/04/23   Eric Chamber, MD  Levodopa (INBRIJA) 42 MG CAPS Samples of this drug were given to the patient, quantity 1, Lot Number [id number] exp 12-24 09/26/22   [name], Eric Batty, DO  methenamine (HIPREX) 1 g tablet TAKE 1 TABLET BY MOUTH 2 TIMES DAILY. TAKE WITH ANY VITAMIN C, TWICE A DAY FOR UTI PREVENTION Patient taking differently: Take 1 g by mouth 2 (two) times daily with a meal. 03/28/23   Vu, Gershon Mussel T, MD  metoprolol succinate (TOPROL-XL) 50 MG 24 hr tablet Take 1 tablet (50 mg total) by mouth daily. Take with or immediately following a meal. 01/02/23   Duke Salvia, MD  nitroGLYCERIN (NITROSTAT) 0.4 MG SL tablet Place 1 tablet (0.4 mg total) under the tongue every 5 (five) minutes as needed. Patient not taking: Reported on 05/02/2023 01/11/23   Eric Page B, NP  pantoprazole (PROTONIX) 40 MG tablet Take 1 tablet (40 mg total) by mouth daily. 01/11/23   Arty Baumgartner, NP  potassium chloride (KLOR-CON) 10  MEQ tablet Take 10 mEq by mouth 2 (two) times daily. 05/18/22   [provider]  rOPINIRole (REQUIP) 3 MG tablet Take 3 mg by mouth at bedtime. 1 to 3 hours before bed time    [provider]  rosuvastatin (CRESTOR) 40 MG tablet Take 1 tablet (40 mg total) by mouth daily. 04/16/23   Jake Bathe, MD  tamsulosin (FLOMAX) 0.4 MG CAPS capsule Take 0.4 mg by mouth in the morning and at bedtime. 01/24/18   [provider]  triamcinolone (NASACORT) 55 MCG/ACT AERO nasal inhaler Place 2 sprays into the nose daily as needed (for allergies or rhinitis).    [provider]  vitamin Le-12 (CYANOCOBALAMIN) 1000 MCG tablet Take 2,000 mcg by mouth daily.    [provider]    Physical Exam: Vitals:   06/23/23 2000 06/23/23 2045 06/23/23 2100 06/23/23 2328  BP: (!) 103/53  (!) 140/57 (!) 130/53  Pulse: (!) 47 (!) 47 (!) 47 62  Resp: 17 16 15 18   Temp:      TempSrc:      SpO2: 100% 100% 99% 99%  Weight:      Height:       Physical Exam:  General: No acute distress, well-nourished HEENT: Normocephalic, atraumatic, left eye is blood red with green discharge coming out of each corner. Cardiovascular: Normal rate and rhythm. Distal pulses intact. Pulmonary: Normal pulmonary effort, normal breath sounds Gastrointestinal: Nondistended abdomen, soft, non-tender, normoactive bowel sounds, no organomegaly Musculoskeletal:Normal ROM, no lower ext edema Lymphadenopathy: No cervical LAD. Skin: Skin is warm and dry. Neuro: No focal deficits noted, AAOx3. Gait not tested PSYCH: Attentive and cooperative  Data Reviewed:  Results for orders placed or performed during the hospital encounter of 06/23/23 (from the past 24 hour(s))  Basic metabolic panel  Status: Abnormal   Collection Time: 06/23/23  6:43 PM  Result Value Ref Range   Sodium 136 135 - 145 mmol/L   Potassium 4.5 3.5 - 5.1 mmol/L   Chloride 103 98 - 111 mmol/L   CO2 26 22 - 32 mmol/L   Glucose, Bld 181  (H) 70 - 99 mg/dL   BUN 20 8 - 23 mg/dL   Creatinine, Ser 5.95 (H) 0.61 - 1.24 mg/dL   Calcium 8.7 (L) 8.9 - 10.3 mg/dL   GFR, Estimated 54 (L) >60 mL/min   Anion gap 7 5 - 15  CBC with Differential     Status: None   Collection Time: 06/23/23  6:43 PM  Result Value Ref Range   WBC 5.6 4.0 - 10.5 K/uL   RBC 4.92 4.22 - 5.81 MIL/uL   Hemoglobin 14.6 13.0 - 17.0 g/dL   HCT 63.8 75.6 - 43.3 %   MCV 91.3 80.0 - 100.0 fL   MCH 29.7 26.0 - 34.0 pg   MCHC 32.5 30.0 - 36.0 g/dL   RDW 29.5 18.8 - 41.6 %   Platelets 238 150 - 400 K/uL   nRBC 0.0 0.0 - 0.2 %   Neutrophils Relative % 79 %   Neutro Abs 4.5 1.7 - 7.7 K/uL   Lymphocytes Relative 13 %   Lymphs Abs 0.7 0.7 - 4.0 K/uL   Monocytes Relative 6 %   Monocytes Absolute 0.3 0.1 - 1.0 K/uL   Eosinophils Relative 1 %   Eosinophils Absolute 0.1 0.0 - 0.5 K/uL   Basophils Relative 1 %   Basophils Absolute 0.0 0.0 - 0.1 K/uL   Immature Granulocytes 0 %   Abs Immature Granulocytes 0.01 0.00 - 0.07 K/uL  Protime-INR     Status: None   Collection Time: 06/23/23  6:43 PM  Result Value Ref Range   Prothrombin Time 14.1 11.4 - 15.2 seconds   INR 1.1 0.8 - 1.2  Urinalysis, w/ Reflex to Culture (Infection Suspected) -Urine, Clean Catch     Status: Abnormal   Collection Time: 06/23/23 10:10 PM  Result Value Ref Range   Specimen Source URINE, CLEAN CATCH    Color, Urine YELLOW YELLOW   APPearance CLEAR CLEAR   Specific Gravity, Urine 1.030 1.005 - 1.030   pH 5.0 5.0 - 8.0   Glucose, UA NEGATIVE NEGATIVE mg/dL   Hgb urine dipstick NEGATIVE NEGATIVE   Bilirubin Urine NEGATIVE NEGATIVE   Ketones, ur NEGATIVE NEGATIVE mg/dL   Protein, ur NEGATIVE NEGATIVE mg/dL   Nitrite NEGATIVE NEGATIVE   Leukocytes,Ua MODERATE (A) NEGATIVE   RBC / HPF 0-5 0 - 5 RBC/hpf   WBC, UA 21-50 0 - 5 WBC/hpf   Bacteria, UA FEW (A) NONE SEEN   Squamous Epithelial / HPF 0-5 0 - 5 /HPF     Assessment and Plan: Encephalopathy/ Chronic E. coli UTI, previously  ESBL- the patient presents with sleeping all day, poor appetite that has been going on for a couple of months despite continuous antibiotics.  The patient missed his Id appointment 4 days ago because the wife was unable to get him up.  He was taking levofloxacin most recently.  His urine is not clearly infected, but he does have symptoms.  His medications have not been significantly changed recently.  Other causes for his lethargy should be investigated. - Cultures obtained - Merrem was started in the emergency department.  Will continue that for now and ask for ID recommendations.  2. Dramatic left eye  conjunctival hemorrhage - Per ED doctor, "Consultations Obtained: I requested consultation with the opthlamologist Dr. Dione Booze,  and discussed lab and imaging findings as well as pertinent plan - they recommend: erythromycin TID, seems to be just bad subconjunctival hemorrhage. HE will see patient tomorrow around lunch. "  3. DMT2 -the patient takes Lantus 10 units at night but his blood sugar here is 181 and he has not been eating well so will start with 8 units tonight.   4. Parkinsons disease x 17 years- continue Sinemet regimen 6 times a day.    Advance Care Planning:   Code Status: Limited: Do not attempt resuscitation (DNR) -DNR-LIMITED -Do Not Intubate/DNI the patient wants to be DNR.  He names his daughter as his Social research officer, government.  Consults: Ophthalmology  Family Communication: The patient's wife and daughter were at bedside  Severity of Illness: The appropriate patient status for this patient is INPATIENT. Inpatient status is judged to be reasonable and necessary in order to provide the required intensity of service to ensure the patient's safety. The patient's presenting symptoms, physical exam findings, and initial radiographic and laboratory data in the context of their chronic comorbidities is felt to place them at high risk for further clinical deterioration. Furthermore, it is  not anticipated that the patient will be medically stable for discharge from the hospital within 2 midnights of admission.   * I certify that at the point of admission it is my clinical judgment that the patient will require inpatient hospital care spanning beyond 2 midnights from the point of admission due to high intensity of service, high risk for further deterioration and high frequency of surveillance required.*  Author: Buena Irish, MD 06/24/2023 12:34 AM  For on call review [email or web address].

## 2023-06-24 NOTE — ED Notes (Signed)
Pt well appearing upon transport to [facility]13.

## 2023-06-24 NOTE — Assessment & Plan Note (Signed)
Continue sertraline 

## 2023-06-24 NOTE — ED Notes (Signed)
ED TO INPATIENT HANDOFF REPORT  ED Nurse Name and Phone #: Vernona Rieger [phone]  S Name/Age/Gender [name] 77 y.o. male Room/Bed: 037C/037C  Code Status   Code Status: Limited: Do not attempt resuscitation (DNR) -DNR-LIMITED -Do Not Intubate/DNI   Home/SNF/Other Home Patient oriented to: self, place, time, and situation Is this baseline? Yes   Triage Complete: Triage complete  Chief Complaint UTI due to extended-spectrum beta lactamase (ESBL) producing Escherichia coli [N39.0, B96.29, Z16.12]  Triage Note Pt BIB [facility] d/t waking up this morning with his Lt eye swollen & stuck together, black to appearance in the center (family states filled with blood), blurred vision & a HA rating his pain 4/10. Pt denies any recent injuries to that eye, A/Ox4, Hx of Parkinson's Disease, diabetes & cardiac stents (per EMS), cbg 217 all other VSS. Pt does report cataract surgery 6 months ago)   Allergies Allergies  Allergen Reactions   Nucynta [Tapentadol] Other (See Comments)    Made the patient not feel well. "He did not feel well at all."   Oxycodone Itching   Sudafed [Pseudoephedrine Hcl] Other (See Comments)    Problems urinating    Tramadol Hcl Itching and Other (See Comments)    Can tolerate, however (in 2023)    Level of Care/Admitting Diagnosis ED Disposition     ED Disposition  Admit   Condition  --   Comment  Hospital Area: [facility] [[id number]]  Level of Care: Med-Surg [[id number]]  May admit patient to [facility] or [facility] if equivalent level of care is available:: No  Covid Evaluation: Asymptomatic - no recent exposure (last 10 days) testing not required  Diagnosis: UTI due to extended-spectrum beta lactamase (ESBL) producing Escherichia coli [[id number]]  Admitting Physician: Buena Irish [[id number]]  Attending Physician: Buena Irish [phone]  Certification:: I certify this patient will need inpatient services for at least 2 midnights  Expected Medical  Readiness: 06/26/2023          B Medical/Surgery History Past Medical History:  Diagnosis Date   Abnormal involuntary movement 02/18/2018   Allergic rhinitis    Arthritis    BPH (benign prostatic hyperplasia)    Diabetes (HCC)    Dysphagia, pharyngeal phase    GERD diagnosed on barium swallow. Has small hiatal hernia. Symptomatically somewhat better on omeprazole but not entirely. We'll try b.i.d. therapy   Edema    1+ in both ankles, likely multifactorial including medication such as Requip   Erectile dysfunction    Staxyn 10 mg or Viagra worked well. 3 samples of Cialis 20 mg provided   GERD (gastroesophageal reflux disease)    Hypercholesteremia    Hypertension    Nephrolithiasis    Onychomycosis of toenail    April 27, 2013 - Dr. Merwyn Katos - podiatry, was in [place] - treating with oral Lamisil and topical nail therapy   Parkinson's disease     followed by Dr. Raquel Sarna at [facility] and Lesia Sago, M.D. in [place]   Presbycusis    and tinnitus - Dr. Serena Colonel - August/2013   Renal calculus    Syncope    Past Surgical History:  Procedure Laterality Date   CORONARY STENT INTERVENTION N/A 01/11/2023   Procedure: CORONARY STENT INTERVENTION;  Surgeon: Kathleene Hazel, MD;  Location: [facility];  Service: Cardiovascular;  Laterality: N/A;   HAND SURGERY     INGUINAL HERNIA REPAIR Left 11/04/2015   Procedure: LEFT INGUINAL HERNIA REPAIR WITH MESH;  Surgeon: Darnell Level, MD;  Location: [facility];  Service: General;  Laterality: Left;   INSERTION OF MESH Left 11/04/2015   Procedure: INSERTION OF MESH;  Surgeon: Darnell Level, MD;  Location: [facility];  Service: General;  Laterality: Left;   JOINT REPLACEMENT Bilateral    KNEE SURGERY     LEFT HEART CATH AND CORONARY ANGIOGRAPHY N/A 01/11/2023   Procedure: LEFT HEART CATH AND CORONARY ANGIOGRAPHY;  Surgeon: Kathleene Hazel, MD;  Location: [facility];  Service:  Cardiovascular;  Laterality: N/A;   TOTAL KNEE ARTHROPLASTY       A IV Location/Drains/Wounds Patient Lines/Drains/Airways Status     Active Line/Drains/Airways     Name Placement date Placement time Site Days   Peripheral IV 06/23/23 20 G Right Antecubital 06/23/23  2039  Antecubital  1   Wound / Incision (Open or Dehisced) 03/01/22 Other (Comment) Knee Anterior;Left 03/01/22  1803  Knee  480   Wound / Incision (Open or Dehisced) 03/01/22 Other (Comment) Axilla Left 03/01/22  1804  Axilla  480   Wound / Incision (Open or Dehisced) 03/01/22 Hand Posterior;Right 03/01/22  1804  Hand  480   Wound / Incision (Open or Dehisced) 03/01/22 Other (Comment) Hand Left;Posterior 03/01/22  1805  Hand  480            Intake/Output Last 24 hours  Intake/Output Summary (Last 24 hours) at 06/24/2023 1404 Last data filed at 06/24/2023 4098 Gross per 24 hour  Intake 1000 ml  Output --  Net 1000 ml    Labs/Imaging Results for orders placed or performed during the hospital encounter of 06/23/23 (from the past 48 hour(s))  Basic metabolic panel     Status: Abnormal   Collection Time: 06/23/23  6:43 PM  Result Value Ref Range   Sodium 136 135 - 145 mmol/L   Potassium 4.5 3.5 - 5.1 mmol/L   Chloride 103 98 - 111 mmol/L   CO2 26 22 - 32 mmol/L   Glucose, Bld 181 (H) 70 - 99 mg/dL    Comment: Glucose reference range applies only to samples taken after fasting for at least 8 hours.   BUN 20 8 - 23 mg/dL   Creatinine, Ser 1.19 (H) 0.61 - 1.24 mg/dL   Calcium 8.7 (L) 8.9 - 10.3 mg/dL   GFR, Estimated 54 (L) >60 mL/min    Comment: (NOTE) Calculated using the CKD-EPI Creatinine Equation (2021)    Anion gap 7 5 - 15    Comment: Performed at [facility], [address], [place], [place] [address]  CBC with Differential     Status: None   Collection Time: 06/23/23  6:43 PM  Result Value Ref Range   WBC 5.6 4.0 - 10.5 K/uL   RBC 4.92 4.22 - 5.81 MIL/uL   Hemoglobin 14.6 13.0 - 17.0 g/dL    HCT 95.6 21.3 - 08.6 %   MCV 91.3 80.0 - 100.0 fL   MCH 29.7 26.0 - 34.0 pg   MCHC 32.5 30.0 - 36.0 g/dL   RDW 57.8 46.9 - 62.9 %   Platelets 238 150 - 400 K/uL   nRBC 0.0 0.0 - 0.2 %   Neutrophils Relative % 79 %   Neutro Abs 4.5 1.7 - 7.7 K/uL   Lymphocytes Relative 13 %   Lymphs Abs 0.7 0.7 - 4.0 K/uL   Monocytes Relative 6 %   Monocytes Absolute 0.3 0.1 - 1.0 K/uL   Eosinophils  Relative 1 %   Eosinophils Absolute 0.1 0.0 - 0.5 K/uL   Basophils Relative 1 %   Basophils Absolute 0.0 0.0 - 0.1 K/uL   Immature Granulocytes 0 %   Abs Immature Granulocytes 0.01 0.00 - 0.07 K/uL    Comment: Performed at [facility], [address], [place], [place] [address]  Protime-INR     Status: None   Collection Time: 06/23/23  6:43 PM  Result Value Ref Range   Prothrombin Time 14.1 11.4 - 15.2 seconds   INR 1.1 0.8 - 1.2    Comment: (NOTE) INR goal varies based on device and disease states. Performed at [facility], [address], [place], [place] [address]   Urinalysis, w/ Reflex to Culture (Infection Suspected) -Urine, Clean Catch     Status: Abnormal   Collection Time: 06/23/23 10:10 PM  Result Value Ref Range   Specimen Source URINE, CLEAN CATCH    Color, Urine YELLOW YELLOW   APPearance CLEAR CLEAR   Specific Gravity, Urine 1.030 1.005 - 1.030   pH 5.0 5.0 - 8.0   Glucose, UA NEGATIVE NEGATIVE mg/dL   Hgb urine dipstick NEGATIVE NEGATIVE   Bilirubin Urine NEGATIVE NEGATIVE   Ketones, ur NEGATIVE NEGATIVE mg/dL   Protein, ur NEGATIVE NEGATIVE mg/dL   Nitrite NEGATIVE NEGATIVE   Leukocytes,Ua MODERATE (A) NEGATIVE   RBC / HPF 0-5 0 - 5 RBC/hpf   WBC, UA 21-50 0 - 5 WBC/hpf    Comment:        Reflex urine culture not performed if WBC <=10, OR if Squamous epithelial cells >5. If Squamous epithelial cells >5 suggest recollection.    Bacteria, UA FEW (A) NONE SEEN   Squamous Epithelial / HPF 0-5 0 - 5 /HPF    Comment: Performed at [facility], [address], [place], [place] [address]  SARS Coronavirus 2 by RT PCR (hospital order, performed in [facility] hospital lab) *cepheid single result test* Anterior Nasal Swab     Status: None   Collection Time: 06/24/23  1:35 AM   Specimen: Anterior Nasal Swab  Result Value Ref Range   SARS Coronavirus 2 by RT PCR NEGATIVE NEGATIVE    Comment: Performed at [facility], [address] [place], [place] [address]  Creatinine, serum     Status: Abnormal   Collection Time: 06/24/23  1:35 AM  Result Value Ref Range   Creatinine, Ser 1.30 (H) 0.61 - 1.24 mg/dL   GFR, Estimated 57 (L) >60 mL/min    Comment: (NOTE) Calculated using the CKD-EPI Creatinine Equation (2021) Performed at [facility], [address] [place], [place] [address]   CBG monitoring, ED     Status: Abnormal   Collection Time: 06/24/23  2:13 AM  Result Value Ref Range   Glucose-Capillary 163 (H) 70 - 99 mg/dL    Comment: Glucose reference range applies only to samples taken after fasting for at least 8 hours.   Comment 1 Notify RN    Comment 2 Document in Chart   Magnesium     Status: None   Collection Time: 06/24/23  6:04 AM  Result Value Ref Range   Magnesium 2.2 1.7 - 2.4 mg/dL    Comment: Performed at [facility], [address] [place], [place] [address]  TSH     Status: None   Collection Time: 06/24/23  6:04 AM  Result Value Ref Range   TSH 2.126  0.350 - 4.500 uIU/mL    Comment: Performed by a 3rd Generation assay with a functional sensitivity of <=0.01 uIU/mL. Performed at [facility], [address] [place], [place] [address]   Basic metabolic panel     Status: Abnormal   Collection Time: 06/24/23  6:04 AM  Result Value Ref Range   Sodium 138 135 - 145 mmol/L   Potassium 4.4 3.5 - 5.1 mmol/L   Chloride 105 98 - 111 mmol/L   CO2 27 22 - 32 mmol/L   Glucose, Bld 202 (H) 70 - 99 mg/dL    Comment: Glucose reference range applies only to samples taken after fasting for at least 8  hours.   BUN 25 (H) 8 - 23 mg/dL   Creatinine, Ser 1.19 (H) 0.61 - 1.24 mg/dL   Calcium 8.6 (L) 8.9 - 10.3 mg/dL   GFR, Estimated 52 (L) >60 mL/min    Comment: (NOTE) Calculated using the CKD-EPI Creatinine Equation (2021)    Anion gap 6 5 - 15    Comment: Performed at [facility], [address] [place], [place] [address]  CBC     Status: None   Collection Time: 06/24/23  6:04 AM  Result Value Ref Range   WBC 6.4 4.0 - 10.5 K/uL   RBC 4.78 4.22 - 5.81 MIL/uL   Hemoglobin 14.6 13.0 - 17.0 g/dL   HCT 95.6 21.3 - 08.6 %   MCV 92.9 80.0 - 100.0 fL   MCH 30.5 26.0 - 34.0 pg   MCHC 32.9 30.0 - 36.0 g/dL   RDW 57.8 46.9 - 62.9 %   Platelets 259 150 - 400 K/uL   nRBC 0.0 0.0 - 0.2 %    Comment: Performed at [facility], [address], [place], [place] [address]  CBG monitoring, ED     Status: Abnormal   Collection Time: 06/24/23  7:41 AM  Result Value Ref Range   Glucose-Capillary 173 (H) 70 - 99 mg/dL    Comment: Glucose reference range applies only to samples taken after fasting for at least 8 hours.   Comment 1 Notify RN    Comment 2 Document in Chart   CBG monitoring, ED     Status: Abnormal   Collection Time: 06/24/23  1:35 PM  Result Value Ref Range   Glucose-Capillary 199 (H) 70 - 99 mg/dL    Comment: Glucose reference range applies only to samples taken after fasting for at least 8 hours.   CT OrbitsS W/O CM  Result Date: 06/23/2023 CLINICAL DATA:  Left periorbital swelling EXAM: CT ORBITS WITHOUT CONTRAST TECHNIQUE: Multidetector CT imaging of the orbits was performed using the standard protocol without intravenous contrast. Multiplanar CT image reconstructions were also generated. RADIATION DOSE REDUCTION: This exam was performed according to the departmental dose-optimization program which includes automated exposure control, adjustment of the mA and/or kV according to patient size and/or use of iterative reconstruction technique. COMPARISON:  None Available.  FINDINGS: Orbits: No orbital mass or evidence of inflammation. Normal appearance of the globes, optic nerve-sheath complexes, extraocular muscles, orbital fat and lacrimal glands. Visible paranasal sinuses: Clear. Soft tissues: There is mild left supraorbital soft tissue edema. No abscess or drainable fluid collection. Osseous: No fracture or aggressive lesion. Limited intracranial: No acute or significant finding. IMPRESSION: Mild left supraorbital soft tissue edema without abscess or drainable fluid collection. Electronically Signed   By: Deatra Robinson M.D.   On: 06/23/2023 21:59   CT Head  Wo Contrast  Result Date: 06/23/2023 CLINICAL DATA:  Headache EXAM: CT HEAD WITHOUT CONTRAST TECHNIQUE: Contiguous axial images were obtained from the base of the skull through the vertex without intravenous contrast. RADIATION DOSE REDUCTION: This exam was performed according to the departmental dose-optimization program which includes automated exposure control, adjustment of the mA and/or kV according to patient size and/or use of iterative reconstruction technique. COMPARISON:  None Available. FINDINGS: Brain: There is no mass, hemorrhage or extra-axial collection. The appearance of the white matter is normal for the patient's age. There is generalized atrophy. Vascular: No abnormal hyperdensity of the major intracranial arteries or dural venous sinuses. No intracranial atherosclerosis. Skull: The visualized skull base, calvarium and extracranial soft tissues are normal. Sinuses/Orbits: No fluid levels or advanced mucosal thickening of the visualized paranasal sinuses. No mastoid or middle ear effusion. The orbits are normal. IMPRESSION: Generalized atrophy without acute intracranial abnormality. Electronically Signed   By: Deatra Robinson M.D.   On: 06/23/2023 21:57   CT VENOGRAM HEAD  Result Date: 06/23/2023 CLINICAL DATA:  Headache EXAM: CT VENOGRAM HEAD TECHNIQUE: Venographic phase images of the brain were obtained  following the administration of intravenous contrast. Multiplanar reformats and maximum intensity projections were generated. RADIATION DOSE REDUCTION: This exam was performed according to the departmental dose-optimization program which includes automated exposure control, adjustment of the mA and/or kV according to patient size and/or use of iterative reconstruction technique. CONTRAST:  75mL OMNIPAQUE IOHEXOL 350 MG/ML SOLN COMPARISON:  None Available. FINDINGS: Superior sagittal sinus: Normal. Straight sinus: Normal. Inferior sagittal sinus, vein of Galen and internal cerebral veins: Normal. Transverse sinuses: Normal. Sigmoid sinuses: Normal. Visualized jugular veins: Normal. IMPRESSION: No evidence of dural venous sinus thrombosis. Electronically Signed   By: Deatra Robinson M.D.   On: 06/23/2023 21:50    Pending Labs Unresulted Labs (From admission, onward)     Start     Ordered   06/23/23 2210  Urine Culture  Once,   R        06/23/23 2210            Vitals/Pain Today's Vitals   06/24/23 0704 06/24/23 0800 06/24/23 0815 06/24/23 1358  BP: 126/68 (!) 125/57 130/88 132/89  Pulse: 63 (!) 52 (!) 57 62  Resp: 19 18  16   Temp: 98.1 F (36.7 C)   98 F (36.7 C)  TempSrc: Oral   Oral  SpO2: 100% 96% 99% 95%  Weight:      Height:      PainSc:  0-No pain      Isolation Precautions No active isolations  Medications Medications  erythromycin ophthalmic ointment 1 Application (1 Application Left Eye Given 06/24/23 1002)  heparin injection 5,000 Units (has no administration in time range)  acetaminophen (TYLENOL) tablet 500 mg (has no administration in time range)    Or  acetaminophen (TYLENOL) suppository 650 mg (has no administration in time range)  ondansetron (ZOFRAN) tablet 4 mg (has no administration in time range)    Or  ondansetron (ZOFRAN) injection 4 mg (has no administration in time range)  insulin glargine-yfgn (SEMGLEE) injection 8 Units (8 Units Subcutaneous Given  06/24/23 0217)  insulin aspart (novoLOG) injection 0-6 Units (1 Units Subcutaneous Given 06/24/23 1356)  carbidopa-levodopa (SINEMET IR) 25-100 MG per tablet immediate release 3 tablet (3 tablets Oral Not Given 06/24/23 1353)  furosemide (LASIX) tablet 40 mg (40 mg Oral Given 06/24/23 1001)  metoprolol succinate (TOPROL-XL) 24 hr tablet 25 mg (25 mg Oral Given 06/24/23 1002)  rosuvastatin (CRESTOR) tablet 40 mg (40 mg Oral Given 06/24/23 1001)  calcium carbonate (TUMS - dosed in mg elemental calcium) chewable tablet 400 mg of elemental calcium (has no administration in time range)  clopidogrel (PLAVIX) tablet 75 mg (75 mg Oral Given 06/24/23 0809)  cyanocobalamin (VITAMIN B12) tablet 2,000 mcg (2,000 mcg Oral Given 06/24/23 1001)  tamsulosin (FLOMAX) capsule 0.4 mg (0 mg Oral Hold 06/24/23 0229)  potassium chloride (KLOR-CON M) CR tablet 10 mEq (10 mEq Oral Given 06/24/23 1001)  sacubitril-valsartan (ENTRESTO) 24-26 mg per tablet (1 tablet Oral Given 06/24/23 1001)  rOPINIRole (REQUIP) tablet 3 mg (3 mg Oral Given 06/24/23 0611)    And  rOPINIRole (REQUIP) tablet 1.5 mg (has no administration in time range)  tetracaine (PONTOCAINE) 0.5 % ophthalmic solution 1-2 drop (1 drop Both Eyes Given 06/24/23 0123)  fluorescein ophthalmic strip 1 strip (1 strip Both Eyes Given 06/24/23 0122)  acetaminophen (TYLENOL) tablet 1,000 mg (1,000 mg Oral Given 06/23/23 1836)  iohexol (OMNIPAQUE) 350 MG/ML injection 75 mL (75 mLs Intravenous Contrast Given 06/23/23 2144)  lactated ringers bolus 1,000 mL (0 mLs Intravenous Stopped 06/24/23 0034)  meropenem (MERREM) 1 g in sodium chloride 0.9 % 100 mL IVPB (0 g Intravenous Stopped 06/24/23 0122)  lactated ringers bolus 1,000 mL (0 mLs Intravenous Stopped 06/24/23 1610)    Mobility walks with device-rolling walker     Focused Assessments Ophthalmology-L eye injury   R Recommendations: See Admitting Provider Note  Report given to:   Additional Notes:

## 2023-06-24 NOTE — Assessment & Plan Note (Signed)
Hemoglobin A1c 11% in June.  Glucoses here high normal, low 200s. - Continue home glargine - Continue sliding scale corrections

## 2023-06-24 NOTE — Assessment & Plan Note (Signed)
S/p PCI just 5 months ago - Continue aspirin and Plavix - Continue Crestor, metoprolol, ARB

## 2023-06-24 NOTE — Telephone Encounter (Signed)
Pt c/o medication issue:  1. Name of Medication: clopidogrel (PLAVIX) tablet 75 mg    2. How are you currently taking this medication (dosage and times per day)? As directed  3. Are you having a reaction (difficulty breathing--STAT)? no  4. What is your medication issue? Patients wife called in, pt is in the hospital with an eye bleed and UTI. A CT was done with and without contrast and his orbits look good but they are not sure if he should come off of his plavix. Please advise.

## 2023-06-25 ENCOUNTER — Inpatient Hospital Stay (HOSPITAL_COMMUNITY): Payer: Medicare Other

## 2023-06-25 DIAGNOSIS — R5381 Other malaise: Secondary | ICD-10-CM | POA: Diagnosis not present

## 2023-06-25 LAB — CBC
HCT: 42.8 % (ref 39.0–52.0)
Hemoglobin: 14 g/dL (ref 13.0–17.0)
MCH: 29.3 pg (ref 26.0–34.0)
MCHC: 32.7 g/dL (ref 30.0–36.0)
MCV: 89.5 fL (ref 80.0–100.0)
Platelets: 235 10*3/uL (ref 150–400)
RBC: 4.78 MIL/uL (ref 4.22–5.81)
RDW: 12.5 % (ref 11.5–15.5)
WBC: 5.4 10*3/uL (ref 4.0–10.5)
nRBC: 0 % (ref 0.0–0.2)

## 2023-06-25 LAB — URINE CULTURE: Culture: NO GROWTH

## 2023-06-25 LAB — GLUCOSE, CAPILLARY
Glucose-Capillary: 119 mg/dL — ABNORMAL HIGH (ref 70–99)
Glucose-Capillary: 193 mg/dL — ABNORMAL HIGH (ref 70–99)
Glucose-Capillary: 186 mg/dL — ABNORMAL HIGH (ref 70–99)

## 2023-06-25 LAB — COMPREHENSIVE METABOLIC PANEL
ALT: <5 U/L (ref 0–44)
AST: 9 U/L — ABNORMAL LOW (ref 15–41)
Albumin: 3.1 g/dL — ABNORMAL LOW (ref 3.5–5.0)
Alkaline Phosphatase: 72 U/L (ref 38–126)
Anion gap: 7 (ref 5–15)
BUN: 15 mg/dL (ref 8–23)
CO2: 28 mmol/L (ref 22–32)
Calcium: 8.8 mg/dL — ABNORMAL LOW (ref 8.9–10.3)
Chloride: 103 mmol/L (ref 98–111)
Creatinine, Ser: 1.13 mg/dL (ref 0.61–1.24)
GFR, Estimated: >60 mL/min (ref >60)
Glucose, Bld: 121 mg/dL — ABNORMAL HIGH (ref 70–99)
Potassium: 3.4 mmol/L — ABNORMAL LOW (ref 3.5–5.1)
Sodium: 138 mmol/L (ref 135–145)
Total Bilirubin: 0.6 mg/dL (ref 0.3–1.2)
Total Protein: 5.8 g/dL — ABNORMAL LOW (ref 6.5–8.1)

## 2023-06-25 NOTE — Plan of Care (Signed)
  Problem: Education: Goal: Ability to describe self-care measures that may prevent or decrease complications (Diabetes Survival Skills Education) will improve Outcome: Adequate for Discharge Goal: Individualized Educational Video(s) Outcome: Adequate for Discharge   Problem: Coping: Goal: Ability to adjust to condition or change in health will improve Outcome: Adequate for Discharge   Problem: Fluid Volume: Goal: Ability to maintain a balanced intake and output will improve Outcome: Adequate for Discharge   Problem: Health Behavior/Discharge Planning: Goal: Ability to identify and utilize available resources and services will improve Outcome: Adequate for Discharge Goal: Ability to manage health-related needs will improve Outcome: Adequate for Discharge   Problem: Metabolic: Goal: Ability to maintain appropriate glucose levels will improve Outcome: Adequate for Discharge   Problem: Nutritional: Goal: Maintenance of adequate nutrition will improve Outcome: Adequate for Discharge Goal: Progress toward achieving an optimal weight will improve Outcome: Adequate for Discharge   Problem: Skin Integrity: Goal: Risk for impaired skin integrity will decrease Outcome: Adequate for Discharge   Problem: Tissue Perfusion: Goal: Adequacy of tissue perfusion will improve Outcome: Adequate for Discharge   Problem: Education: Goal: Understanding of CV disease, CV risk reduction, and recovery process will improve Outcome: Adequate for Discharge Goal: Individualized Educational Video(s) Outcome: Adequate for Discharge   Problem: Activity: Goal: Ability to return to baseline activity level will improve Outcome: Adequate for Discharge   Problem: Cardiovascular: Goal: Ability to achieve and maintain adequate cardiovascular perfusion will improve Outcome: Adequate for Discharge Goal: Vascular access site(s) Level 0-1 will be maintained Outcome: Adequate for Discharge   Problem: Health  Behavior/Discharge Planning: Goal: Ability to safely manage health-related needs after discharge will improve Outcome: Adequate for Discharge   Problem: Health Behavior/Discharge Planning: Goal: Ability to manage health-related needs will improve Outcome: Adequate for Discharge   Problem: Education: Goal: Knowledge of General Education information will improve Description: Including pain rating scale, medication(s)/side effects and non-pharmacologic comfort measures Outcome: Adequate for Discharge   Problem: Clinical Measurements: Goal: Ability to maintain clinical measurements within normal limits will improve Outcome: Adequate for Discharge Goal: Will remain free from infection Outcome: Adequate for Discharge Goal: Diagnostic test results will improve Outcome: Adequate for Discharge Goal: Respiratory complications will improve Outcome: Adequate for Discharge Goal: Cardiovascular complication will be avoided Outcome: Adequate for Discharge   Problem: Activity: Goal: Risk for activity intolerance will decrease Outcome: Adequate for Discharge   Problem: Nutrition: Goal: Adequate nutrition will be maintained Outcome: Adequate for Discharge   Problem: Coping: Goal: Level of anxiety will decrease Outcome: Adequate for Discharge   Problem: Elimination: Goal: Will not experience complications related to bowel motility Outcome: Adequate for Discharge Goal: Will not experience complications related to urinary retention Outcome: Adequate for Discharge   Problem: Pain Managment: Goal: General experience of comfort will improve Outcome: Adequate for Discharge   Problem: Safety: Goal: Ability to remain free from injury will improve Outcome: Adequate for Discharge   Problem: Skin Integrity: Goal: Risk for impaired skin integrity will decrease Outcome: Adequate for Discharge

## 2023-06-25 NOTE — Progress Notes (Signed)
DISCHARGE NOTE HOME DIJOHN REGEHR to be discharged Home per MD order. Discussed prescriptions and follow up appointments with the patient. Prescriptions given to patient; medication list explained in detail. Patient verbalized understanding.  Skin clean, dry and intact without evidence of skin break down, no evidence of skin tears noted. IV catheter discontinued intact. Site without signs and symptoms of complications. Dressing and pressure applied. Pt denies pain at the site currently. No complaints noted.  Patient free of lines, drains, and wounds.   An After Visit Summary (AVS) was printed and given to the patient. Patient escorted via wheelchair, and discharged home via private auto. Pick up by wife Cathlyn Parsons, RN

## 2023-06-25 NOTE — Discharge Summary (Signed)
Physician Discharge Summary   Patient: [name] MRN: [id number] DOB: 04-Dec-1945  Admit date:     06/23/2023  Discharge date: 06/25/23  Discharge Physician: Alberteen Sam   PCP: Emilio Aspen, MD     Recommendations at discharge:  Follow up with Ophthalmology Dr. Dione Booze for subconjunctival hemorrhage Follow up with PCP Dr. Orson Aloe for malaise, somnolence, insomnia Follow up with ID Dr. Renold Don as needed for recurrent ESBL UTI     Discharge Diagnoses: Principal Problem:   Malaise  Active Problems:   Chemosis and subconjunctival hemorrhage   Parkinson disease (HCC)   Diabetes mellitus with coincident hypertension (HCC)   HTN (hypertension)   CKD (chronic kidney disease), stage IIIa (HCC)   Chronic combined systolic and diastolic heart failure (HCC)   Coronary artery disease involving native coronary 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 angina pectoris   Depression      Hospital Course: [name] is a 77 y.o. M with Parkinson's disease, CAD s/p PCI x4 last Mar, sCHF EF 45%, depression, HTN, DM, and chronic urinary retention and recurrent ESBL UTI who presented with bloody eye for 1 day, in the setting of progressive fatigue, malaise, and sluggishness.       [facility]  Patient presented with increased somnolence, malaise, fatigue. He has had this constellation of symptoms before with urinary tract infection.  Here, WBC normal, HR and temperature normal.  Urine culture drawn, no growth at 24 hours.  Had recently finished 8 days Levaquin by his Urologist without improvement in fatigue/malaise.  I will follow the culture personally and update family with results.  Family and patient are aware to seek care if patient develops fever, confusion, inability to function.  I suspect his fatigue is more related to sleep cycle disturbance, hyperglycemia, Parkinson's and depression.   Subconjunctival hemorrhage with substantial chemosis Evaluated by Dr. Dione Booze.  Supportive  care only.   Follow up in the office.  Depression On sertraline.  Recommend close follow up with PCP for dose titration.  Insomnia Recommend CBT.  Specific guidance given.  Coronary artery disease involving native coronary 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 angina pectoris S/p PCI just 5 months ago Stable, continue aspirin and Plavix, too early to hold.  Chronic combined systolic and diastolic heart failure (HCC) Appeared euvolemic.     CKD (chronic kidney disease), stage IIIa (HCC) Baseline 1.2-1.3  HTN (hypertension) On Entresto, metoprolol  Diabetes mellitus with coincident hypertension (HCC) Hemoglobin A1c 11% in June.  Glucoses here easy to control.    Parkinson disease (HCC) On Sinemet, entacapone, ropinirole.            [place] Controlled Substances Registry was reviewed for this patient prior to discharge.   Consultants: Ophthalmology Procedures performed: None  Disposition: Home Diet recommendation:  Discharge Diet Orders (From admission, onward)     Start     Ordered   06/25/23 0000  Diet - low sodium heart healthy        06/25/23 1217             DISCHARGE MEDICATION: Allergies as of 06/25/2023       Reactions   Nucynta [tapentadol] Other (See Comments)   Made the patient not feel well. "He did not feel well at all."   Oxycodone Itching   Sudafed [pseudoephedrine Hcl] Other (See Comments)   Problems urinating    Tramadol Hcl Itching, Other (See Comments)   Can tolerate, however (in 2023)  Medication List     STOP taking these medications    Inbrija 42 MG Caps Generic drug: Levodopa   levofloxacin 500 MG tablet Commonly known as: LEVAQUIN       TAKE these medications    acetaminophen 500 MG tablet Commonly known as: TYLENOL Take 500-1,000 mg by mouth every 6 (six) hours as needed (for pain).   aspirin EC 81 MG tablet Take 81 mg by mouth daily. Swallow whole.   carbidopa-levodopa 25-100 MG  tablet Commonly known as: SINEMET IR TAKE 3 TABLETS BY MOUTH AT 7:00 AM, 3 TABS AT 10:00 AM, 3 TABS AT 12:00 PM, 3 TABS AT 2:00 PM, 3 TABS AT 4:00, AND 3 TABS AT 6:00 PM What changed: See the new instructions.   clopidogrel 75 MG tablet Commonly known as: PLAVIX Take 1 tablet (75 mg total) by mouth daily with breakfast.   cyanocobalamin 1000 MCG tablet Commonly known as: VITAMIN B12 Take 2,000 mcg by mouth daily.   Dexcom G7 Sensor Misc 1 each by Does not apply route as needed.   entacapone 200 MG tablet Commonly known as: COMTAN TAKE 1 TABLET BY MOUTH 3 TIMES DAILY (WITH FIRST THREE DOSAGES OF CARBIDOPA/LEVODOPA).   Entresto 24-26 MG Generic drug: sacubitril-valsartan TAKE 1 TABLET BY MOUTH 2 TIMES DAILY.   furosemide 20 MG tablet Commonly known as: LASIX Take 2 tablets (40 mg total) by mouth daily. What changed:  how much to take when to take this   Global Ease Inject Pen Needles 31G X 5 MM Misc Generic drug: Insulin Pen Needle Inject into the skin.   insulin lispro 100 UNIT/ML KwikPen Commonly known as: HUMALOG Inject 2-15 Units into the skin 4 (four) times daily -  before meals and at bedtime. CBG 121 - 150: 2 units, CBG 151 - 200: 3 units, CBG 201 - 250: 5 units, CBG 251 - 300: 8 units, CBG 301 - 350: 11 units, CBG 351 - 400: 15 units What changed:  how much to take additional instructions   Lantus SoloStar 100 UNIT/ML Solostar Pen Generic drug: insulin glargine Inject 25 Units into the skin at bedtime.   methenamine 1 g tablet Commonly known as: HIPREX TAKE 1 TABLET BY MOUTH 2 TIMES DAILY. TAKE WITH ANY VITAMIN C, TWICE A DAY FOR UTI PREVENTION What changed: See the new instructions.   metoprolol succinate 50 MG 24 hr tablet Commonly known as: TOPROL-XL Take 1 tablet (50 mg total) by mouth daily. Take with or immediately following a meal.   nitroGLYCERIN 0.4 MG SL tablet Commonly known as: Nitrostat Place 1 tablet (0.4 mg total) under the tongue every 5  (five) minutes as needed.   pantoprazole 40 MG tablet Commonly known as: Protonix Take 1 tablet (40 mg total) by mouth daily.   potassium chloride 10 MEQ tablet Commonly known as: KLOR-CON Take 10 mEq by mouth 2 (two) times daily.   rOPINIRole 3 MG tablet Commonly known as: REQUIP Take 1.5-3 mg by mouth at bedtime. Take 1 in the morning, Take 1 in afternoon and Take 1/2 tablet in the evening   rosuvastatin 40 MG tablet Commonly known as: Crestor Take 1 tablet (40 mg total) by mouth daily.   sertraline 50 MG tablet Commonly known as: ZOLOFT Take 50 mg by mouth daily.   tamsulosin 0.4 MG Caps capsule Commonly known as: FLOMAX Take 0.4 mg by mouth in the morning and at bedtime.   triamcinolone 55 MCG/ACT Aero nasal inhaler Commonly known as: NASACORT Place 2  sprays into the nose daily as needed (for allergies or rhinitis).   Tums 500 MG chewable tablet Generic drug: calcium carbonate Chew 2 tablets by mouth daily as needed for indigestion or heartburn.   Vitamin D 50 MCG (2000 UT) tablet Take 2,000 Units by mouth daily.        Follow-up Information     Sallye Lat, MD Follow up.   Specialty: Ophthalmology Contact information: [address] [place] [address] [phone]         Emilio Aspen, MD. Schedule an appointment as soon as possible for a visit in 1 week(s).   Specialty: Internal Medicine Contact information: [address] [place] [address] [phone]                 Discharge Instructions     Diet - low sodium heart healthy   Complete by: As directed    Discharge instructions   Complete by: As directed    **IMPORTANT DISCHARGE INSTRUCTIONS**   From Dr. Maryfrances Bunnell: You were admitted for the eye problem and for fatigue/weakness.  For the eye: This looks to Dr. Dione Booze like a particularly bad subconjunctival hemorrhage. It is not uncommon in the setting of taking Plavix Keep taking the  Plavix, this is important The hemorrhage will resolve by itself Don't rub the eye Put the ointment in the eye 3-4 times daily    For the fatigue: Here, your white blood cell count was normal You had no fever You did get some IV fluids and you got a better night's sleep and things seem stable for now  I will follow up the results of the urine culture For now, there is nothing growing. Even if something grows, I recommend doing nothing, UNLESS you have a fever or appear frankly ill, obviously (ie you can't stand up due to dizziness, seem confused and weak, etc)    For the sleep: Explore Sleep Reset (from [employer], I believe) Or Sleepio   These are both available online or on your phone I think they are expensive, but a few months is well worth it   Increase activity slowly   Complete by: As directed        Discharge Exam: Filed Weights   06/23/23 1723 06/24/23 1459  Weight: 81.6 kg 81.6 kg    General: Pt is alert, awake, not in acute distress, the left eye has significant chemosis.  No surrounding rednes or periorbital edema. Cardiovascular: RRR, nl S1-S2, no murmurs appreciated.   No LE edema.   Respiratory: Normal respiratory rate and rhythm.  CTAB without rales or wheezes. Abdominal: Abdomen soft and non-tender.  No distension or HSM.   Neuro/Psych: Strength symmetric in upper and lower extremities.  Judgment and insight appear normal.   Condition at discharge: fair  The results of significant diagnostics from this hospitalization (including imaging, microbiology, ancillary and laboratory) are listed below for reference.   Imaging Studies: DG CHEST PORT 1 VIEW  Result Date: 06/25/2023 CLINICAL DATA:  Cough, malaise EXAM: PORTABLE CHEST 1 VIEW COMPARISON:  Chest radiograph 04/01/2023 FINDINGS: The cardiomediastinal silhouette is stable and within normal limits There is no focal consolidation or pulmonary edema. There is no pleural effusion or pneumothorax. There is a  new irregular nodular opacity projecting over the right upper lobe not seen on the prior study. There is no acute osseous abnormality. IMPRESSION: 1. No radiographic evidence of acute cardiopulmonary process. 2. Possible right upper lobe nodule for which  nonemergent outpatient chest CT is recommended. Electronically Signed   By: Lesia Hausen M.D.   On: 06/25/2023 09:05   CT OrbitsS W/O CM  Result Date: 06/23/2023 CLINICAL DATA:  Left periorbital swelling EXAM: CT ORBITS WITHOUT CONTRAST TECHNIQUE: Multidetector CT imaging of the orbits was performed using the standard protocol without intravenous contrast. Multiplanar CT image reconstructions were also generated. RADIATION DOSE REDUCTION: This exam was performed according to the departmental dose-optimization program which includes automated exposure control, adjustment of the mA and/or kV according to patient size and/or use of iterative reconstruction technique. COMPARISON:  None Available. FINDINGS: Orbits: No orbital mass or evidence of inflammation. Normal appearance of the globes, optic nerve-sheath complexes, extraocular muscles, orbital fat and lacrimal glands. Visible paranasal sinuses: Clear. Soft tissues: There is mild left supraorbital soft tissue edema. No abscess or drainable fluid collection. Osseous: No fracture or aggressive lesion. Limited intracranial: No acute or significant finding. IMPRESSION: Mild left supraorbital soft tissue edema without abscess or drainable fluid collection. Electronically Signed   By: Deatra Robinson M.D.   On: 06/23/2023 21:59   CT Head Wo Contrast  Result Date: 06/23/2023 CLINICAL DATA:  Headache EXAM: CT HEAD WITHOUT CONTRAST TECHNIQUE: Contiguous axial images were obtained from the base of the skull through the vertex without intravenous contrast. RADIATION DOSE REDUCTION: This exam was performed according to the departmental dose-optimization program which includes automated exposure control, adjustment of the mA  and/or kV according to patient size and/or use of iterative reconstruction technique. COMPARISON:  None Available. FINDINGS: Brain: There is no mass, hemorrhage or extra-axial collection. The appearance of the white matter is normal for the patient's age. There is generalized atrophy. Vascular: No abnormal hyperdensity of the major intracranial arteries or dural venous sinuses. No intracranial atherosclerosis. Skull: The visualized skull base, calvarium and extracranial soft tissues are normal. Sinuses/Orbits: No fluid levels or advanced mucosal thickening of the visualized paranasal sinuses. No mastoid or middle ear effusion. The orbits are normal. IMPRESSION: Generalized atrophy without acute intracranial abnormality. Electronically Signed   By: Deatra Robinson M.D.   On: 06/23/2023 21:57   CT VENOGRAM HEAD  Result Date: 06/23/2023 CLINICAL DATA:  Headache EXAM: CT VENOGRAM HEAD TECHNIQUE: Venographic phase images of the brain were obtained following the administration of intravenous contrast. Multiplanar reformats and maximum intensity projections were generated. RADIATION DOSE REDUCTION: This exam was performed according to the departmental dose-optimization program which includes automated exposure control, adjustment of the mA and/or kV according to patient size and/or use of iterative reconstruction technique. CONTRAST:  75mL OMNIPAQUE IOHEXOL 350 MG/ML SOLN COMPARISON:  None Available. FINDINGS: Superior sagittal sinus: Normal. Straight sinus: Normal. Inferior sagittal sinus, vein of Galen and internal cerebral veins: Normal. Transverse sinuses: Normal. Sigmoid sinuses: Normal. Visualized jugular veins: Normal. IMPRESSION: No evidence of dural venous sinus thrombosis. Electronically Signed   By: Deatra Robinson M.D.   On: 06/23/2023 21:50    Microbiology: Results for orders placed or performed during the hospital encounter of 06/23/23  Urine Culture     Status: None   Collection Time: 06/23/23 10:10 PM    Specimen: Urine, Random  Result Value Ref Range Status   Specimen Description URINE, RANDOM  Final   Special Requests NONE Reflexed from [id number]  Final   Culture   Final    NO GROWTH Performed at [facility], [address], [place], [place] [address]    Report Status 06/25/2023 FINAL  Final  SARS Coronavirus 2 by RT PCR (hospital  order, performed in [facility] hospital lab) *cepheid single result test* Anterior Nasal Swab     Status: None   Collection Time: 06/24/23  1:35 AM   Specimen: Anterior Nasal Swab  Result Value Ref Range Status   SARS Coronavirus 2 by RT PCR NEGATIVE NEGATIVE Final    Comment: Performed at [facility], [address], [place], [place] [address]    Labs: CBC: Recent Labs  Lab 06/23/23 1843 06/24/23 0604 06/25/23 0608  WBC 5.6 6.4 5.4  NEUTROABS 4.5  --   --   HGB 14.6 14.6 14.0  HCT 44.9 44.4 42.8  MCV 91.3 92.9 89.5  PLT 238 259 235   Basic Metabolic Panel: Recent Labs  Lab 06/23/23 1843 06/24/23 0135 06/24/23 0604 06/25/23 0608  NA 136  --  138 138  K 4.5  --  4.4 3.4*  CL 103  --  105 103  CO2 26  --  27 28  GLUCOSE 181*  --  202* 121*  BUN 20  --  25* 15  CREATININE 1.35* 1.30* 1.41* 1.13  CALCIUM 8.7*  --  8.6* 8.8*  MG  --   --  2.2  --    Liver Function Tests: Recent Labs  Lab 06/25/23 0608  AST 9*  ALT <5  ALKPHOS 72  BILITOT 0.6  PROT 5.8*  ALBUMIN 3.1*   CBG: Recent Labs  Lab 06/24/23 1635 06/24/23 2035 06/25/23 0554 06/25/23 0815 06/25/23 1133  GLUCAP 154* 152* 119* 193* 186*    Discharge time spent: approximately 35 minutes spent on discharge counseling, evaluation of patient on day of discharge, and coordination of discharge planning with nursing, social work, pharmacy and case management  Signed: Alberteen Sam, MD [facility] 06/25/2023

## 2023-06-25 NOTE — TOC Transition Note (Signed)
Transition of Care [facility]) - CM/SW Discharge Note   Patient Details  Name: CRUZITO VAUGHNS MRN: [id number] Date of Birth: 07/27/1946  Transition of Care [facility]) CM/SW Contact:  Tom-Johnson, Hershal Coria, RN Phone Number: 06/25/2023, 12:36 PM   Clinical Narrative:     Patient is scheduled for discharge today.  Readmission Risk Assessment done. Outpatient f/u, hospital f/u and discharge instructions on AVS. No TOC needs or recommendations noted. Wife, Harriett Sine to transport at discharge.  No further TOC needs noted.       Final next level of care: Home/Self Care Barriers to Discharge: Barriers Resolved   Patient Goals and CMS Choice CMS Medicare.gov Compare Post Acute Care list provided to:: Patient Choice offered to / list presented to : NA  Discharge Placement                  Patient to be transferred to facility by: Wife Name of family member notified: [place]    Discharge Plan and Services Additional resources added to the After Visit Summary for                  DME Arranged: N/A DME Agency: NA       HH Arranged: NA HH Agency: NA        Social Determinants of Health (SDOH) Interventions SDOH Screenings   Food Insecurity: No Food Insecurity (06/24/2023)  Housing: Low Risk  (06/24/2023)  Recent Concern: Housing - Medium Risk (06/24/2023)  Transportation Needs: No Transportation Needs (06/24/2023)  Utilities: Not At Risk (06/24/2023)  Depression (PHQ2-9): Low Risk  (05/29/2022)  Tobacco Use: Low Risk  (06/23/2023)     Readmission Risk Interventions    06/25/2023   12:33 PM 05/08/2022    3:02 PM 05/01/2022    2:36 PM  Readmission Risk Prevention Plan  Transportation Screening Complete Complete Complete  PCP or Specialist Appt within 5-7 Days Complete  Complete  PCP or Specialist Appt within 3-5 Days  Complete   Home Care Screening Complete  Complete  Medication Review (RN CM) Referral to Pharmacy  Complete  HRI or Home Care Consult  Complete   Social Work  Consult for Recovery Care Planning/Counseling  Complete   Palliative Care Screening  Not Applicable   Medication Review [facility])  Complete

## 2023-06-26 ENCOUNTER — Other Ambulatory Visit: Payer: Self-pay | Admitting: Cardiology

## 2023-06-26 ENCOUNTER — Ambulatory Visit: Payer: Medicare Other | Admitting: Cardiology

## 2023-06-27 ENCOUNTER — Other Ambulatory Visit: Payer: Self-pay | Admitting: Neurology

## 2023-06-27 DIAGNOSIS — G20A1 Parkinson's disease without dyskinesia, without mention of fluctuations: Secondary | ICD-10-CM

## 2023-06-28 DIAGNOSIS — G2581 Restless legs syndrome: Secondary | ICD-10-CM | POA: Diagnosis not present

## 2023-06-28 DIAGNOSIS — N183 Chronic kidney disease, stage 3 unspecified: Secondary | ICD-10-CM | POA: Diagnosis not present

## 2023-06-28 DIAGNOSIS — I5041 Acute combined systolic (congestive) and diastolic (congestive) heart failure: Secondary | ICD-10-CM | POA: Diagnosis not present

## 2023-06-28 DIAGNOSIS — D72829 Elevated white blood cell count, unspecified: Secondary | ICD-10-CM | POA: Diagnosis not present

## 2023-06-28 DIAGNOSIS — M1990 Unspecified osteoarthritis, unspecified site: Secondary | ICD-10-CM | POA: Diagnosis not present

## 2023-06-28 DIAGNOSIS — I130 Hypertensive heart and chronic kidney disease with heart failure and stage 1 through stage 4 chronic kidney disease, or unspecified chronic kidney disease: Secondary | ICD-10-CM | POA: Diagnosis not present

## 2023-06-28 DIAGNOSIS — E1165 Type 2 diabetes mellitus with hyperglycemia: Secondary | ICD-10-CM | POA: Diagnosis not present

## 2023-06-28 DIAGNOSIS — N201 Calculus of ureter: Secondary | ICD-10-CM | POA: Diagnosis not present

## 2023-06-28 DIAGNOSIS — J309 Allergic rhinitis, unspecified: Secondary | ICD-10-CM | POA: Diagnosis not present

## 2023-06-28 DIAGNOSIS — E1151 Type 2 diabetes mellitus with diabetic peripheral angiopathy without gangrene: Secondary | ICD-10-CM | POA: Diagnosis not present

## 2023-06-28 DIAGNOSIS — I2510 Atherosclerotic heart disease of native coronary artery without angina pectoris: Secondary | ICD-10-CM | POA: Diagnosis not present

## 2023-06-28 DIAGNOSIS — K5900 Constipation, unspecified: Secondary | ICD-10-CM | POA: Diagnosis not present

## 2023-06-28 DIAGNOSIS — G20B1 Parkinson's disease with dyskinesia, without mention of fluctuations: Secondary | ICD-10-CM | POA: Diagnosis not present

## 2023-06-28 DIAGNOSIS — N179 Acute kidney failure, unspecified: Secondary | ICD-10-CM | POA: Diagnosis not present

## 2023-06-28 DIAGNOSIS — K219 Gastro-esophageal reflux disease without esophagitis: Secondary | ICD-10-CM | POA: Diagnosis not present

## 2023-06-28 DIAGNOSIS — R1313 Dysphagia, pharyngeal phase: Secondary | ICD-10-CM | POA: Diagnosis not present

## 2023-06-28 DIAGNOSIS — M810 Age-related osteoporosis without current pathological fracture: Secondary | ICD-10-CM | POA: Diagnosis not present

## 2023-06-28 DIAGNOSIS — I444 Left anterior fascicular block: Secondary | ICD-10-CM | POA: Diagnosis not present

## 2023-06-28 DIAGNOSIS — E1122 Type 2 diabetes mellitus with diabetic chronic kidney disease: Secondary | ICD-10-CM | POA: Diagnosis not present

## 2023-06-28 DIAGNOSIS — N3000 Acute cystitis without hematuria: Secondary | ICD-10-CM | POA: Diagnosis not present

## 2023-06-28 DIAGNOSIS — I872 Venous insufficiency (chronic) (peripheral): Secondary | ICD-10-CM | POA: Diagnosis not present

## 2023-07-01 ENCOUNTER — Encounter (HOSPITAL_BASED_OUTPATIENT_CLINIC_OR_DEPARTMENT_OTHER): Payer: Self-pay | Admitting: Cardiology

## 2023-07-01 ENCOUNTER — Ambulatory Visit (HOSPITAL_BASED_OUTPATIENT_CLINIC_OR_DEPARTMENT_OTHER): Payer: Medicare Other | Admitting: Cardiology

## 2023-07-01 VITALS — BP 125/72 | HR 50 | Ht 68.0 in | Wt 192.5 lb

## 2023-07-01 DIAGNOSIS — I5020 Unspecified systolic (congestive) heart failure: Secondary | ICD-10-CM

## 2023-07-01 DIAGNOSIS — I10 Essential (primary) hypertension: Secondary | ICD-10-CM | POA: Diagnosis not present

## 2023-07-01 NOTE — Patient Instructions (Signed)
Medication Instructions:  Please taper off your Metoprolol - take Metoprolol 50 mg 1/2 tablet for 3 days then stop. Continue all other medications as listed.  *If you need a refill on your cardiac medications before your next appointment, please call your pharmacy*  Follow-Up: At [facility], you and your health needs are our priority.  As part of our continuing mission to provide you with exceptional heart care, we have created designated Provider Care Teams.  These Care Teams include your primary Cardiologist (physician) and Advanced Practice Providers (APPs -  Physician Assistants and Nurse Practitioners) who all work together to provide you with the care you need, when you need it.  We recommend signing up for the patient portal called "MyChart".  Sign up information is provided on this After Visit Summary.  MyChart is used to connect with patients for Virtual Visits (Telemedicine).  Patients are able to view lab/test results, encounter notes, upcoming appointments, etc.  Non-urgent messages can be sent to your provider as well.   To learn more about what you can do with MyChart, go to [email or web address].    Your next appointment:   6 month(s)  Provider:   Jari Favre, PA-C, Robin Searing, NP, Eligha Bridegroom, NP, Tereso Newcomer, PA-C, or Perlie Gold, PA-C

## 2023-07-01 NOTE — Progress Notes (Signed)
Cardiology Office Note:  .   Date:  07/01/2023  ID:  [name], DOB Apr 20, 1946, MRN [id number] PCP: Emilio Aspen, MD  [facility] Providers Cardiologist:  Donato Schultz, MD    History of Present Illness: .   [name] is a 76 y.o. male   Discussed the use of AI scribe software   History of Present Illness   The patient, with a history of heart disease, Parkinson's disease, and diabetes, presents with a subconjunctival hemorrhage and a low heart rate. The patient recently had a stent placed 5 months ago and is on Plavix. The patient is also experiencing excessive sleepiness, which may be in part due to his current medication, metoprolol, but Parkinson's also contributing. This is a significant change from his previous sleep patterns. The patient's daughter (now school nurse but prior CCU at [facility]), who has a background in healthcare, has noticed these changes and is concerned.        Studies Reviewed: Marland Kitchen   EKG Interpretation Date/Time:  Monday July 01 2023 10:07:51 EDT Ventricular Rate:  50 PR Interval:  168 QRS Duration:  110 QT Interval:  448 QTC Calculation: 408 R Axis:   -48  Text Interpretation: Sinus bradycardia Left axis deviation Incomplete right bundle branch block Septal infarct , age undetermined When compared with ECG of 23-Jun-2023 17:20, Premature ventricular complexes are no longer present Confirmed by Donato Schultz ([id number]) on 07/01/2023 10:17:07 AM    Creat 1.1 Cath 01/2023 - LAD and RCA stent ECHO 50% EF, prior 40% 2023  Risk Assessment/Calculations:            Physical Exam:   VS:  BP 125/72 (BP Location: Left Arm, Patient Position: Sitting, Cuff Size: Large)   Pulse (!) 50   Ht 5\' 8"  (1.727 m)   Wt 192 lb 8 oz (87.3 kg)   SpO2 97%   BMI 29.27 kg/m    Wt Readings from Last 3 Encounters:  07/01/23 192 lb 8 oz (87.3 kg)  06/24/23 180 lb (81.6 kg)  05/02/23 194 lb (88 kg)    GEN: Well nourished, well developed in no acute distress,  Left conjunctival hemorrhage.  NECK: No JVD; No carotid bruits CARDIAC: Huston Foley Reg, no murmurs, rubs, gallops RESPIRATORY:  Clear to auscultation without rales, wheezing or rhonchi  ABDOMEN: Soft, non-tender, non-distended EXTREMITIES:  No edema; No deformity   ASSESSMENT AND PLAN: .    Assessment and Plan    Subconjunctival Hemorrhage Recent onset. No threat to vision as per ophthalmologist's assessment. -Continue Plavix   Bradycardia Heart rate consistently in the 40s-50s, possibly contributing to increased fatigue and sleepiness. Currently on Metoprolol 50mg . -Reduce Metoprolol to 25mg  for three days, then discontinue. Monitor for changes in energy levels and heart rate. -PVC's noted previously (also can contribute to low HR on BP machine)  Parkinson's Disease Progression noted with increased fatigue and sleepiness. -Encourage continued physical activity as tolerated, such as stationary biking.  Diabetes Managed with endocrinologist. No changes discussed in this visit.  CAD with minimally reduced EF -Continue current medications including Plavix, Crestor, and Entresto. Stopping Toprol due to bradycardia and symptoms -Follow-up with ophthalmologist as scheduled.            Dispo: 6 mths  Signed, Donato Schultz, MD

## 2023-07-03 ENCOUNTER — Other Ambulatory Visit: Payer: Self-pay

## 2023-07-03 ENCOUNTER — Encounter (HOSPITAL_COMMUNITY): Payer: Self-pay | Admitting: *Deleted

## 2023-07-03 ENCOUNTER — Inpatient Hospital Stay (HOSPITAL_COMMUNITY)
Admission: EM | Admit: 2023-07-03 | Discharge: 2023-07-08 | DRG: 696 | Disposition: A | Discharge status: Skilled Nursing Facility | Payer: Medicare Other | Attending: Internal Medicine | Admitting: Internal Medicine

## 2023-07-03 ENCOUNTER — Emergency Department (HOSPITAL_COMMUNITY): Payer: Medicare Other

## 2023-07-03 DIAGNOSIS — E663 Overweight: Secondary | ICD-10-CM | POA: Diagnosis present

## 2023-07-03 DIAGNOSIS — Z79899 Other long term (current) drug therapy: Secondary | ICD-10-CM | POA: Diagnosis not present

## 2023-07-03 DIAGNOSIS — S2242XD Multiple fractures of ribs, left side, subsequent encounter for fracture with routine healing: Secondary | ICD-10-CM | POA: Diagnosis not present

## 2023-07-03 DIAGNOSIS — R262 Difficulty in walking, not elsewhere classified: Secondary | ICD-10-CM | POA: Diagnosis present

## 2023-07-03 DIAGNOSIS — R4701 Aphasia: Secondary | ICD-10-CM | POA: Diagnosis not present

## 2023-07-03 DIAGNOSIS — N401 Enlarged prostate with lower urinary tract symptoms: Secondary | ICD-10-CM | POA: Diagnosis present

## 2023-07-03 DIAGNOSIS — Z6829 Body mass index (BMI) 29.0-29.9, adult: Secondary | ICD-10-CM

## 2023-07-03 DIAGNOSIS — Z751 Person awaiting admission to adequate facility elsewhere: Secondary | ICD-10-CM

## 2023-07-03 DIAGNOSIS — G9389 Other specified disorders of brain: Secondary | ICD-10-CM | POA: Diagnosis not present

## 2023-07-03 DIAGNOSIS — F32A Depression, unspecified: Secondary | ICD-10-CM | POA: Diagnosis present

## 2023-07-03 DIAGNOSIS — K219 Gastro-esophageal reflux disease without esophagitis: Secondary | ICD-10-CM | POA: Diagnosis present

## 2023-07-03 DIAGNOSIS — S2232XD Fracture of one rib, left side, subsequent encounter for fracture with routine healing: Secondary | ICD-10-CM | POA: Diagnosis not present

## 2023-07-03 DIAGNOSIS — W182XXA Fall in (into) shower or empty bathtub, initial encounter: Secondary | ICD-10-CM | POA: Diagnosis present

## 2023-07-03 DIAGNOSIS — E8809 Other disorders of plasma-protein metabolism, not elsewhere classified: Secondary | ICD-10-CM | POA: Diagnosis present

## 2023-07-03 DIAGNOSIS — N1831 Chronic kidney disease, stage 3a: Secondary | ICD-10-CM | POA: Diagnosis present

## 2023-07-03 DIAGNOSIS — M6281 Muscle weakness (generalized): Secondary | ICD-10-CM | POA: Diagnosis not present

## 2023-07-03 DIAGNOSIS — R2689 Other abnormalities of gait and mobility: Secondary | ICD-10-CM | POA: Diagnosis not present

## 2023-07-03 DIAGNOSIS — Z66 Do not resuscitate: Secondary | ICD-10-CM | POA: Diagnosis present

## 2023-07-03 DIAGNOSIS — S0990XA Unspecified injury of head, initial encounter: Secondary | ICD-10-CM | POA: Diagnosis not present

## 2023-07-03 DIAGNOSIS — Z043 Encounter for examination and observation following other accident: Secondary | ICD-10-CM | POA: Diagnosis not present

## 2023-07-03 DIAGNOSIS — R4020 Unspecified coma: Secondary | ICD-10-CM | POA: Diagnosis not present

## 2023-07-03 DIAGNOSIS — E7800 Pure hypercholesterolemia, unspecified: Secondary | ICD-10-CM | POA: Diagnosis present

## 2023-07-03 DIAGNOSIS — I6521 Occlusion and stenosis of right carotid artery: Secondary | ICD-10-CM | POA: Diagnosis not present

## 2023-07-03 DIAGNOSIS — I5032 Chronic diastolic (congestive) heart failure: Secondary | ICD-10-CM | POA: Diagnosis not present

## 2023-07-03 DIAGNOSIS — Z7902 Long term (current) use of antithrombotics/antiplatelets: Secondary | ICD-10-CM | POA: Diagnosis not present

## 2023-07-03 DIAGNOSIS — I672 Cerebral atherosclerosis: Secondary | ICD-10-CM | POA: Diagnosis not present

## 2023-07-03 DIAGNOSIS — W19XXXA Unspecified fall, initial encounter: Secondary | ICD-10-CM | POA: Diagnosis not present

## 2023-07-03 DIAGNOSIS — Y92009 Unspecified place in unspecified non-institutional (private) residence as the place of occurrence of the external cause: Secondary | ICD-10-CM | POA: Diagnosis not present

## 2023-07-03 DIAGNOSIS — N200 Calculus of kidney: Secondary | ICD-10-CM | POA: Diagnosis not present

## 2023-07-03 DIAGNOSIS — I2510 Atherosclerotic heart disease of native coronary artery without angina pectoris: Secondary | ICD-10-CM | POA: Diagnosis present

## 2023-07-03 DIAGNOSIS — Z8249 Family history of ischemic heart disease and other diseases of the circulatory system: Secondary | ICD-10-CM | POA: Diagnosis not present

## 2023-07-03 DIAGNOSIS — Z7982 Long term (current) use of aspirin: Secondary | ICD-10-CM

## 2023-07-03 DIAGNOSIS — E1122 Type 2 diabetes mellitus with diabetic chronic kidney disease: Secondary | ICD-10-CM | POA: Diagnosis present

## 2023-07-03 DIAGNOSIS — S37022A Major contusion of left kidney, initial encounter: Secondary | ICD-10-CM | POA: Diagnosis not present

## 2023-07-03 DIAGNOSIS — Z888 Allergy status to other drugs, medicaments and biological substances status: Secondary | ICD-10-CM

## 2023-07-03 DIAGNOSIS — R310 Gross hematuria: Principal | ICD-10-CM | POA: Diagnosis present

## 2023-07-03 DIAGNOSIS — R2681 Unsteadiness on feet: Secondary | ICD-10-CM | POA: Diagnosis not present

## 2023-07-03 DIAGNOSIS — H1130 Conjunctival hemorrhage, unspecified eye: Secondary | ICD-10-CM | POA: Diagnosis not present

## 2023-07-03 DIAGNOSIS — S2242XA Multiple fractures of ribs, left side, initial encounter for closed fracture: Secondary | ICD-10-CM | POA: Diagnosis present

## 2023-07-03 DIAGNOSIS — R0989 Other specified symptoms and signs involving the circulatory and respiratory systems: Secondary | ICD-10-CM | POA: Diagnosis not present

## 2023-07-03 DIAGNOSIS — R338 Other retention of urine: Secondary | ICD-10-CM | POA: Diagnosis not present

## 2023-07-03 DIAGNOSIS — Z955 Presence of coronary angioplasty implant and graft: Secondary | ICD-10-CM

## 2023-07-03 DIAGNOSIS — S2232XA Fracture of one rib, left side, initial encounter for closed fracture: Secondary | ICD-10-CM | POA: Diagnosis not present

## 2023-07-03 DIAGNOSIS — Z825 Family history of asthma and other chronic lower respiratory diseases: Secondary | ICD-10-CM

## 2023-07-03 DIAGNOSIS — I130 Hypertensive heart and chronic kidney disease with heart failure and stage 1 through stage 4 chronic kidney disease, or unspecified chronic kidney disease: Secondary | ICD-10-CM | POA: Diagnosis not present

## 2023-07-03 DIAGNOSIS — Z794 Long term (current) use of insulin: Secondary | ICD-10-CM

## 2023-07-03 DIAGNOSIS — I5022 Chronic systolic (congestive) heart failure: Secondary | ICD-10-CM | POA: Diagnosis not present

## 2023-07-03 DIAGNOSIS — R531 Weakness: Secondary | ICD-10-CM | POA: Diagnosis not present

## 2023-07-03 DIAGNOSIS — R4182 Altered mental status, unspecified: Secondary | ICD-10-CM | POA: Diagnosis not present

## 2023-07-03 DIAGNOSIS — G20A1 Parkinson's disease without dyskinesia, without mention of fluctuations: Secondary | ICD-10-CM | POA: Diagnosis present

## 2023-07-03 DIAGNOSIS — R1310 Dysphagia, unspecified: Secondary | ICD-10-CM | POA: Diagnosis not present

## 2023-07-03 DIAGNOSIS — Y92002 Bathroom of unspecified non-institutional (private) residence single-family (private) house as the place of occurrence of the external cause: Secondary | ICD-10-CM | POA: Diagnosis not present

## 2023-07-03 DIAGNOSIS — I129 Hypertensive chronic kidney disease with stage 1 through stage 4 chronic kidney disease, or unspecified chronic kidney disease: Secondary | ICD-10-CM | POA: Diagnosis not present

## 2023-07-03 DIAGNOSIS — Z9181 History of falling: Secondary | ICD-10-CM | POA: Diagnosis not present

## 2023-07-03 DIAGNOSIS — R4189 Other symptoms and signs involving cognitive functions and awareness: Secondary | ICD-10-CM | POA: Diagnosis not present

## 2023-07-03 DIAGNOSIS — Z96653 Presence of artificial knee joint, bilateral: Secondary | ICD-10-CM | POA: Diagnosis present

## 2023-07-03 DIAGNOSIS — Z885 Allergy status to narcotic agent status: Secondary | ICD-10-CM

## 2023-07-03 LAB — COMPREHENSIVE METABOLIC PANEL WITH GFR
ALT: <5 U/L (ref 0–44)
AST: 11 U/L — ABNORMAL LOW (ref 15–41)
Albumin: 3.7 g/dL (ref 3.5–5.0)
Alkaline Phosphatase: 79 U/L (ref 38–126)
Anion gap: 11 (ref 5–15)
BUN: 33 mg/dL — ABNORMAL HIGH (ref 8–23)
CO2: 25 mmol/L (ref 22–32)
Calcium: 9.2 mg/dL (ref 8.9–10.3)
Chloride: 104 mmol/L (ref 98–111)
Creatinine, Ser: 1.27 mg/dL — ABNORMAL HIGH (ref 0.61–1.24)
GFR, Estimated: 59 mL/min — ABNORMAL LOW (ref >60)
Glucose, Bld: 163 mg/dL — ABNORMAL HIGH (ref 70–99)
Potassium: 4.3 mmol/L (ref 3.5–5.1)
Sodium: 140 mmol/L (ref 135–145)
Total Bilirubin: 0.7 mg/dL (ref 0.3–1.2)
Total Protein: 6.5 g/dL (ref 6.5–8.1)

## 2023-07-03 LAB — CBC WITH DIFFERENTIAL/PLATELET
Abs Immature Granulocytes: 0.02 10*3/uL (ref 0.00–0.07)
Basophils Absolute: 0 10*3/uL (ref 0.0–0.1)
Basophils Relative: 0 %
Eosinophils Absolute: 0.1 10*3/uL (ref 0.0–0.5)
Eosinophils Relative: 2 %
HCT: 45.5 % (ref 39.0–52.0)
Hemoglobin: 14.3 g/dL (ref 13.0–17.0)
Immature Granulocytes: 0 %
Lymphocytes Relative: 15 %
Lymphs Abs: 1.1 10*3/uL (ref 0.7–4.0)
MCH: 29.5 pg (ref 26.0–34.0)
MCHC: 31.4 g/dL (ref 30.0–36.0)
MCV: 93.8 fL (ref 80.0–100.0)
Monocytes Absolute: 0.6 10*3/uL (ref 0.1–1.0)
Monocytes Relative: 9 %
Neutro Abs: 5.2 10*3/uL (ref 1.7–7.7)
Neutrophils Relative %: 74 %
Platelets: 244 10*3/uL (ref 150–400)
RBC: 4.85 MIL/uL (ref 4.22–5.81)
RDW: 13 % (ref 11.5–15.5)
WBC: 7.1 10*3/uL (ref 4.0–10.5)
nRBC: 0 % (ref 0.0–0.2)

## 2023-07-03 LAB — URINALYSIS, ROUTINE W REFLEX MICROSCOPIC
Bilirubin Urine: NEGATIVE
Glucose, UA: NEGATIVE mg/dL
Ketones, ur: 5 mg/dL — AB
Leukocytes,Ua: NEGATIVE
Nitrite: NEGATIVE
Protein, ur: 100 mg/dL — AB
RBC / HPF: >50 RBC/hpf (ref 0–5)
Specific Gravity, Urine: 1.023 (ref 1.005–1.030)
pH: 5 (ref 5.0–8.0)

## 2023-07-03 LAB — GLUCOSE, CAPILLARY: Glucose-Capillary: 189 mg/dL — ABNORMAL HIGH (ref 70–99)

## 2023-07-03 MED ORDER — SODIUM CHLORIDE (PF) 0.9 % IJ SOLN
INTRAMUSCULAR | Status: AC
  Filled 2023-07-03: qty 50

## 2023-07-03 MED ORDER — LIDOCAINE 5 % EX PTCH
1.0000 | MEDICATED_PATCH | CUTANEOUS | Status: DC
  Administered 2023-07-03 – 2023-07-07 (×5): 1 via TRANSDERMAL
  Filled 2023-07-03 (×5): qty 1

## 2023-07-03 MED ORDER — ASPIRIN 81 MG PO TBEC
81.0000 mg | DELAYED_RELEASE_TABLET | Freq: Every day | ORAL | Status: DC
  Administered 2023-07-03 – 2023-07-08 (×6): 81 mg via ORAL
  Filled 2023-07-03 (×6): qty 1

## 2023-07-03 MED ORDER — ONDANSETRON HCL 4 MG/2ML IJ SOLN
4.0000 mg | Freq: Once | INTRAMUSCULAR | Status: AC
  Administered 2023-07-03: 4 mg via INTRAVENOUS
  Filled 2023-07-03: qty 2

## 2023-07-03 MED ORDER — ACETAMINOPHEN 325 MG PO TABS
650.0000 mg | ORAL_TABLET | Freq: Four times a day (QID) | ORAL | Status: DC
  Administered 2023-07-03 – 2023-07-08 (×16): 650 mg via ORAL
  Filled 2023-07-03 (×18): qty 2

## 2023-07-03 MED ORDER — ONDANSETRON HCL 4 MG/2ML IJ SOLN
4.0000 mg | Freq: Four times a day (QID) | INTRAMUSCULAR | Status: DC | PRN
  Administered 2023-07-05: 4 mg via INTRAVENOUS
  Filled 2023-07-03: qty 2

## 2023-07-03 MED ORDER — PANTOPRAZOLE SODIUM 40 MG PO TBEC
40.0000 mg | DELAYED_RELEASE_TABLET | Freq: Every day | ORAL | Status: DC
  Administered 2023-07-04 – 2023-07-08 (×5): 40 mg via ORAL
  Filled 2023-07-03 (×5): qty 1

## 2023-07-03 MED ORDER — FENTANYL CITRATE PF 50 MCG/ML IJ SOSY: 25.0000 ug | PREFILLED_SYRINGE | INTRAMUSCULAR | Status: DC | PRN

## 2023-07-03 MED ORDER — HYDROMORPHONE HCL 2 MG PO TABS: 1.0000 mg | ORAL_TABLET | ORAL | Status: DC | PRN

## 2023-07-03 MED ORDER — ACETAMINOPHEN 650 MG RE SUPP: 650.0000 mg | Freq: Four times a day (QID) | RECTAL | Status: DC | PRN

## 2023-07-03 MED ORDER — ALBUTEROL SULFATE (2.5 MG/3ML) 0.083% IN NEBU: 2.5000 mg | INHALATION_SOLUTION | RESPIRATORY_TRACT | Status: DC | PRN

## 2023-07-03 MED ORDER — IOHEXOL 300 MG/ML  SOLN
100.0000 mL | Freq: Once | INTRAMUSCULAR | Status: AC | PRN
  Administered 2023-07-03: 100 mL via INTRAVENOUS

## 2023-07-03 MED ORDER — CLOPIDOGREL BISULFATE 75 MG PO TABS
75.0000 mg | ORAL_TABLET | Freq: Every day | ORAL | Status: DC
  Administered 2023-07-04 – 2023-07-08 (×5): 75 mg via ORAL
  Filled 2023-07-03 (×5): qty 1

## 2023-07-03 MED ORDER — ACETAMINOPHEN 325 MG PO TABS
650.0000 mg | ORAL_TABLET | Freq: Four times a day (QID) | ORAL | Status: DC | PRN
  Administered 2023-07-06: 650 mg via ORAL

## 2023-07-03 MED ORDER — INSULIN ASPART 100 UNIT/ML IJ SOLN
0.0000 [IU] | Freq: Three times a day (TID) | INTRAMUSCULAR | Status: DC
  Administered 2023-07-04: 3 [IU] via SUBCUTANEOUS
  Administered 2023-07-04: 2 [IU] via SUBCUTANEOUS
  Administered 2023-07-04: 3 [IU] via SUBCUTANEOUS
  Administered 2023-07-05: 2 [IU] via SUBCUTANEOUS
  Administered 2023-07-05 – 2023-07-06 (×3): 5 [IU] via SUBCUTANEOUS
  Administered 2023-07-06: 2 [IU] via SUBCUTANEOUS
  Administered 2023-07-07 (×2): 3 [IU] via SUBCUTANEOUS
  Administered 2023-07-07: 5 [IU] via SUBCUTANEOUS
  Administered 2023-07-08 (×2): 2 [IU] via SUBCUTANEOUS
  Filled 2023-07-03: qty 0.15

## 2023-07-03 MED ORDER — TRAMADOL HCL 50 MG PO TABS
50.0000 mg | ORAL_TABLET | Freq: Four times a day (QID) | ORAL | Status: DC | PRN
  Administered 2023-07-04 – 2023-07-07 (×3): 50 mg via ORAL
  Filled 2023-07-03 (×3): qty 1

## 2023-07-03 MED ORDER — FENTANYL CITRATE PF 50 MCG/ML IJ SOSY
50.0000 ug | PREFILLED_SYRINGE | Freq: Once | INTRAMUSCULAR | Status: AC
  Administered 2023-07-03: 50 ug via INTRAVENOUS
  Filled 2023-07-03: qty 1

## 2023-07-03 MED ORDER — INSULIN ASPART 100 UNIT/ML IJ SOLN
0.0000 [IU] | Freq: Every day | INTRAMUSCULAR | Status: DC
  Administered 2023-07-04: 3 [IU] via SUBCUTANEOUS
  Filled 2023-07-03: qty 0.05

## 2023-07-03 MED ORDER — FUROSEMIDE 40 MG PO TABS
20.0000 mg | ORAL_TABLET | Freq: Two times a day (BID) | ORAL | Status: DC
  Administered 2023-07-04 – 2023-07-08 (×9): 20 mg via ORAL
  Filled 2023-07-03 (×10): qty 1

## 2023-07-03 MED ORDER — ONDANSETRON HCL 4 MG PO TABS: 4.0000 mg | ORAL_TABLET | Freq: Four times a day (QID) | ORAL | Status: DC | PRN

## 2023-07-03 MED ORDER — ROSUVASTATIN CALCIUM 20 MG PO TABS
40.0000 mg | ORAL_TABLET | Freq: Every day | ORAL | Status: DC
  Administered 2023-07-04 – 2023-07-08 (×5): 40 mg via ORAL
  Filled 2023-07-03 (×3): qty 2
  Filled 2023-07-03 (×2): qty 4
  Filled 2023-07-03 (×2): qty 2
  Filled 2023-07-03 (×2): qty 4

## 2023-07-03 MED ORDER — SACUBITRIL-VALSARTAN 24-26 MG PO TABS
1.0000 | ORAL_TABLET | Freq: Two times a day (BID) | ORAL | Status: DC
  Administered 2023-07-03 – 2023-07-08 (×10): 1 via ORAL
  Filled 2023-07-03 (×10): qty 1

## 2023-07-03 MED ORDER — HYDROMORPHONE HCL 1 MG/ML IJ SOLN
1.0000 mg | INTRAMUSCULAR | Status: DC | PRN
  Administered 2023-07-03 – 2023-07-07 (×3): 1 mg via INTRAVENOUS
  Filled 2023-07-03 (×3): qty 1

## 2023-07-03 MED ORDER — METHOCARBAMOL 500 MG PO TABS
500.0000 mg | ORAL_TABLET | Freq: Four times a day (QID) | ORAL | Status: DC
  Administered 2023-07-03 – 2023-07-08 (×17): 500 mg via ORAL
  Filled 2023-07-03 (×18): qty 1

## 2023-07-03 MED ORDER — TRAZODONE HCL 50 MG PO TABS
25.0000 mg | ORAL_TABLET | Freq: Every evening | ORAL | Status: DC | PRN
  Administered 2023-07-04: 25 mg via ORAL
  Filled 2023-07-03: qty 1

## 2023-07-03 MED ORDER — TAMSULOSIN HCL 0.4 MG PO CAPS
0.4000 mg | ORAL_CAPSULE | Freq: Every day | ORAL | Status: DC
  Administered 2023-07-03 – 2023-07-08 (×6): 0.4 mg via ORAL
  Filled 2023-07-03 (×6): qty 1

## 2023-07-03 MED ORDER — INSULIN GLARGINE-YFGN 100 UNIT/ML ~~LOC~~ SOLN
8.0000 [IU] | Freq: Every day | SUBCUTANEOUS | Status: DC
  Administered 2023-07-03 – 2023-07-07 (×5): 8 [IU] via SUBCUTANEOUS
  Filled 2023-07-03 (×6): qty 0.08

## 2023-07-03 MED ORDER — SERTRALINE HCL 50 MG PO TABS
50.0000 mg | ORAL_TABLET | Freq: Every day | ORAL | Status: DC
  Administered 2023-07-04 – 2023-07-08 (×5): 50 mg via ORAL
  Filled 2023-07-03 (×5): qty 1

## 2023-07-03 MED ORDER — SODIUM CHLORIDE 0.9 % IV BOLUS: 1000.0000 mL | Freq: Once | INTRAVENOUS | Status: DC

## 2023-07-03 MED ORDER — SODIUM CHLORIDE 0.9 % IV BOLUS
500.0000 mL | Freq: Once | INTRAVENOUS | Status: AC
  Administered 2023-07-03: 500 mL via INTRAVENOUS

## 2023-07-03 MED ORDER — INSULIN GLARGINE-YFGN 100 UNIT/ML ~~LOC~~ SOLN
20.0000 [IU] | Freq: Every day | SUBCUTANEOUS | Status: DC
Start: 2023-07-03 — End: 2023-07-03

## 2023-07-03 NOTE — ED Provider Notes (Signed)
[place] EMERGENCY DEPARTMENT AT [facility] Provider Note  CSN: [id number] Arrival date & time: 07/03/23 1027  Chief Complaint(s) Fall  HPI [name] is a 77 y.o. male with PMH Parkinson's, CAD status post PCI, CHF with last EF 45%, depression, HTN, T2DM, chronic urinary retention and recurrent ESBL UTI who presents emergency department for evaluation of a fall and hematuria.  Patient stated approximately 48 hours ago he fell in the shower landing on his left side.  He has had worsening left flank pain and hematuria with blood clots since the fall.  Patient is on Plavix.  Denies loss of consciousness, chest pain, shortness of breath, headache, fever or other systemic symptoms.   Past Medical History Past Medical History:  Diagnosis Date   Abnormal involuntary movement 02/18/2018   Allergic rhinitis    Arthritis    BPH (benign prostatic hyperplasia)    Diabetes (HCC)    Dysphagia, pharyngeal phase    GERD diagnosed on barium swallow. Has small hiatal hernia. Symptomatically somewhat better on omeprazole but not entirely. We'll try b.i.d. therapy   Edema    1+ in both ankles, likely multifactorial including medication such as Requip   Erectile dysfunction    Staxyn 10 mg or Viagra worked well. 3 samples of Cialis 20 mg provided   GERD (gastroesophageal reflux disease)    Hypercholesteremia    Hypertension    Nephrolithiasis    Onychomycosis of toenail    April 27, 2013 - Dr. Merwyn Katos - podiatry, was in [place] - treating with oral Lamisil and topical nail therapy   Parkinson's disease     followed by Dr. Raquel Sarna at [facility] and Lesia Sago, M.D. in [facility]   Presbycusis    and tinnitus - Dr. Serena Colonel - August/2013   Renal calculus    Syncope    Patient Active Problem List   Diagnosis Date Noted   Depression 06/24/2023   Chemosis 06/24/2023   Elevated troponin 04/03/2023   Sepsis (HCC) 04/01/2023   Coronary artery disease involving native coronary  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 angina pectoris 01/11/2023   Chronic combined systolic and diastolic heart failure (HCC)    Dysphagia 04/29/2022   AKI (acute kidney injury) (HCC)    Dehydration    Failure to thrive (child)    Bacteriuria, asymptomatic    Failure to thrive in adult 04/20/2022   Malaise, possible ESBL Klebsiella UTI 04/20/2022   Chronic retention of urine 04/20/2022   Closed lumbar vertebral fracture (HCC) 04/20/2022   UTI (urinary tract infection) 03/01/2022   Leukocytosis 01/01/2022   Hyponatremia 01/01/2022   CKD (chronic kidney disease), stage IIIa (HCC) 01/01/2022   HTN (hypertension) 12/31/2021   History of ESBL Klebsiella pneumoniae infection 12/31/2021   Generalized weakness 12/31/2021   Chronic venous insufficiency 08/29/2021   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   Encounter for general adult medical examination with abnormal findings 02/18/2018   Enlarged prostate 02/18/2018   Hearing loss 02/18/2018   Other long term (current) drug therapy 02/18/2018   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) 02/18/2018   HLD (hyperlipidemia)    Nephrolithiasis    Parkinson disease (HCC)    Diabetes mellitus with coincident hypertension (HCC)    Syncope    Renal calculus    Diabetes (HCC)    Allergic rhinitis    BPH (benign prostatic hyperplasia)    Erectile dysfunction    Dysphagia, pharyngeal phase    Edema    Ureteral  calculus 02/09/2013   LEG PAIN 03/14/2010   Home Medication(s) Prior to Admission medications   Medication Sig Start Date End Date Taking? Authorizing Provider  acetaminophen (TYLENOL) 500 MG tablet Take 500-1,000 mg by mouth every 6 (six) hours as needed (for pain).    [provider]  aspirin EC 81 MG tablet Take 81 mg by mouth daily. Swallow whole.    [provider]  calcium carbonate (TUMS) 500 MG chewable tablet Chew 2 tablets by mouth daily as needed for indigestion or heartburn.    [provider]  carbidopa-levodopa (SINEMET IR) 25-100 MG tablet TAKE 3 TABLETS BY MOUTH AT 7:00 AM, 3 TABS AT 10:00 AM, 3 TABS AT 12:00 PM, 3 TABS AT 2:00 PM, 3 TABS AT 4:00, AND 3 TABS AT 6:00 PM 06/27/23   [name], Octaviano Batty, DO  Cholecalciferol (VITAMIN D) 50 MCG (2000 UT) tablet Take 2,000 Units by mouth daily.    [provider]  clopidogrel (PLAVIX) 75 MG tablet Take 1 tablet (75 mg total) by mouth daily with breakfast. 01/12/23   Laverda Page B, NP  Continuous Glucose Sensor (DEXCOM G7 SENSOR) MISC 1 each by Does not apply route as needed. 04/04/23   Lewie Chamber, MD  entacapone (COMTAN) 200 MG tablet TAKE 1 TABLET BY MOUTH 3 TIMES DAILY (WITH FIRST THREE DOSAGES OF CARBIDOPA/LEVODOPA). 06/04/23   [name], Rebecca S, DO  ENTRESTO 24-26 MG TAKE 1 TABLET BY MOUTH 2 TIMES DAILY. 02/11/23   Jake Bathe, MD  furosemide (LASIX) 20 MG tablet Take 2 tablets (40 mg total) by mouth daily. Patient taking differently: Take 20 mg by mouth 2 (two) times daily. 01/02/23   Duke Salvia, MD  GLOBAL EASE INJECT PEN NEEDLES 31G X 5 MM MISC Inject into the skin. 05/09/21   [provider]  insulin lispro (HUMALOG) 100 UNIT/ML KwikPen Inject 2-15 Units into the skin 4 (four) times daily -  before meals and at bedtime. CBG 121 - 150: 2 units, CBG 151 - 200: 3 units, CBG 201 - 250: 5 units, CBG 251 - 300: 8 units, CBG 301 - 350: 11 units, CBG 351 - 400: 15 units Patient taking differently: Inject 2-3 Units into the skin 4 (four) times daily -  before meals and at bedtime. CBG Sliding Scale 04/04/23   Lewie Chamber, MD  LANTUS SOLOSTAR 100 UNIT/ML Solostar Pen Inject 25 Units into the skin at bedtime. 04/04/23   Lewie Chamber, MD  methenamine (HIPREX) 1 g tablet TAKE 1 TABLET BY MOUTH 2 TIMES DAILY. TAKE WITH ANY VITAMIN C, TWICE A DAY FOR UTI PREVENTION Patient taking differently: Take 1 g by mouth 2 (two) times daily with a meal. 03/28/23   Vu, Gershon Mussel T, MD  nitroGLYCERIN (NITROSTAT) 0.4 MG SL tablet  Place 1 tablet (0.4 mg total) under the tongue every 5 (five) minutes as needed. 01/11/23   Arty Baumgartner, NP  pantoprazole (PROTONIX) 40 MG tablet TAKE 1 TABLET (40 MG TOTAL) BY MOUTH DAILY 06/26/23   Jake Bathe, MD  potassium chloride (KLOR-CON) 10 MEQ tablet Take 10 mEq by mouth 2 (two) times daily. 05/18/22   [provider]  rOPINIRole (REQUIP) 3 MG tablet Take 1.5-3 mg by mouth at bedtime. Take 1 in the morning, Take 1 in afternoon and Take 1/2 tablet in the evening    [provider]  rosuvastatin (CRESTOR) 40 MG tablet Take 1 tablet (40 mg total) by mouth daily. 04/16/23   Donato Schultz  C, MD  sertraline (ZOLOFT) 50 MG tablet Take 50 mg by mouth daily.    [provider]  tamsulosin (FLOMAX) 0.4 MG CAPS capsule Take 0.4 mg by mouth in the morning and at bedtime. 01/24/18   [provider]  triamcinolone (NASACORT) 55 MCG/ACT AERO nasal inhaler Place 2 sprays into the nose daily as needed (for allergies or rhinitis).    [provider]  vitamin B-12 (CYANOCOBALAMIN) 1000 MCG tablet Take 2,000 mcg by mouth daily.    [provider]                                                                                                                                    Past Surgical History Past Surgical History:  Procedure Laterality Date   CORONARY STENT INTERVENTION N/A 01/11/2023   Procedure: CORONARY STENT INTERVENTION;  Surgeon: Kathleene Hazel, MD;  Location: [facility];  Service: Cardiovascular;  Laterality: N/A;   HAND SURGERY     INGUINAL HERNIA REPAIR Left 11/04/2015   Procedure: LEFT INGUINAL HERNIA REPAIR WITH MESH;  Surgeon: Darnell Level, MD;  Location: [facility];  Service: General;  Laterality: Left;   INSERTION OF MESH Left 11/04/2015   Procedure: INSERTION OF MESH;  Surgeon: Darnell Level, MD;  Location: [facility];  Service: General;  Laterality: Left;   JOINT REPLACEMENT Bilateral     KNEE SURGERY     LEFT HEART CATH AND CORONARY ANGIOGRAPHY N/A 01/11/2023   Procedure: LEFT HEART CATH AND CORONARY ANGIOGRAPHY;  Surgeon: Kathleene Hazel, MD;  Location: [facility];  Service: Cardiovascular;  Laterality: N/A;   TOTAL KNEE ARTHROPLASTY     Family History Family History  Problem Relation Age of Onset   Atrial fibrillation Mother    Breast cancer Mother    Emphysema Father    Heart disease Father    Cancer Brother        African Burkitt    Social History Social History   Tobacco Use   Smoking status: Never   Smokeless tobacco: Never  Vaping Use   Vaping status: Never Used  Substance Use Topics   Alcohol use: No   Drug use: No   Allergies Nucynta [tapentadol], Oxycodone, Sudafed [pseudoephedrine hcl], and Tramadol hcl  Review of Systems Review of Systems  Genitourinary:  Positive for flank pain and hematuria.    Physical Exam Vital Signs  I have reviewed the triage vital signs BP (!) 146/86 (BP Location: Left Arm)   Pulse (!) 57   Temp 98.3 F (36.8 C) (Oral)   Resp 16   Ht 5\' 8"  (1.727 m)   Wt 87.3 kg   SpO2 93%   BMI 29.27 kg/m   Physical Exam Constitutional:      General: He is not in acute distress.    Appearance: Normal appearance.  HENT:     Head: Normocephalic and  atraumatic.     Nose: No congestion or rhinorrhea.  Eyes:     General:        Right eye: No discharge.        Left eye: No discharge.     Extraocular Movements: Extraocular movements intact.     Pupils: Pupils are equal, round, and reactive to light.  Cardiovascular:     Rate and Rhythm: Normal rate and regular rhythm.     Heart sounds: No murmur heard. Pulmonary:     Effort: No respiratory distress.     Breath sounds: No wheezing or rales.  Abdominal:     General: There is no distension.     Tenderness: There is no abdominal tenderness.  Musculoskeletal:        General: Tenderness present. Normal range of motion.     Cervical back: Normal range of  motion.  Skin:    General: Skin is warm and dry.  Neurological:     General: No focal deficit present.     Mental Status: He is alert.     ED Results and Treatments Labs (all labs ordered are listed, but only abnormal results are displayed) Labs Reviewed  COMPREHENSIVE METABOLIC PANEL - Abnormal; Notable for the following components:      Result Value   Glucose, Bld 163 (*)    BUN 33 (*)    Creatinine, Ser 1.27 (*)    AST 11 (*)    GFR, Estimated 59 (*)    All other components within normal limits  URINALYSIS, ROUTINE W REFLEX MICROSCOPIC - Abnormal; Notable for the following components:   APPearance HAZY (*)    Hgb urine dipstick LARGE (*)    Ketones, ur 5 (*)    Protein, ur 100 (*)    Bacteria, UA RARE (*)    All other components within normal limits  CBC WITH DIFFERENTIAL/PLATELET                                                                                                                          Radiology No results found.  Pertinent labs & imaging results that were available during my care of the patient were reviewed by me and considered in my medical decision making (see MDM for details).  Medications Ordered in ED Medications - No data to display  Procedures Procedures  (including critical care time)  Medical Decision Making / ED Course   This patient presents to the ED for concern of fall, hematuria, this involves an extensive number of treatment options, and is a complaint that carries with it a high risk of complications and morbidity.  The differential diagnosis includes fracture, contusion, hematoma, ligamentous injury, closed head injury, ICH, laceration, intrathoracic injury, intra-abdominal injury  MDM: Patient seen emergency room for evaluation of a fall.  Physical exam with significant left-sided chest wall and CVA  tenderness but is otherwise unremarkable.  No additional external signs of trauma over the chest abdomen or pelvis.  Laboratory evaluation with a BUN of 33, creatinine 1.27, urinalysis with large hematuria, 6-10 white blood cells and rare bacteria but no nitrites or leuk esterase, no significant leukocytosis.  At time of signout, patient pending completion of CT imaging including CT head, chest abdomen pelvis.  Please see provider signout for continuation of workup   Additional history obtained: -Additional history obtained from multiple family members -External records from outside source obtained and reviewed including: Chart review including previous notes, labs, imaging, consultation notes   Lab Tests: -I ordered, reviewed, and interpreted labs.   The pertinent results include:   Labs Reviewed  COMPREHENSIVE METABOLIC PANEL - Abnormal; Notable for the following components:      Result Value   Glucose, Bld 163 (*)    BUN 33 (*)    Creatinine, Ser 1.27 (*)    AST 11 (*)    GFR, Estimated 59 (*)    All other components within normal limits  URINALYSIS, ROUTINE W REFLEX MICROSCOPIC - Abnormal; Notable for the following components:   APPearance HAZY (*)    Hgb urine dipstick LARGE (*)    Ketones, ur 5 (*)    Protein, ur 100 (*)    Bacteria, UA RARE (*)    All other components within normal limits  CBC WITH DIFFERENTIAL/PLATELET        Imaging Studies ordered: I ordered imaging studies including CT head, chest abdomen pelvis and these are pending   Medicines ordered and prescription drug management: No orders of the defined types were placed in this encounter.   -I have reviewed the patients home medicines and have made adjustments as needed  Critical interventions none    Cardiac Monitoring: The patient was maintained on a cardiac monitor.  I personally viewed and interpreted the cardiac monitored which showed an underlying rhythm of: NSR  Social Determinants of  Health:  Factors impacting patients care include: none   Reevaluation: After the interventions noted above, I reevaluated the patient and found that they have :stayed the same  Co morbidities that complicate the patient evaluation  Past Medical History:  Diagnosis Date   Abnormal involuntary movement 02/18/2018   Allergic rhinitis    Arthritis    BPH (benign prostatic hyperplasia)    Diabetes (HCC)    Dysphagia, pharyngeal phase    GERD diagnosed on barium swallow. Has small hiatal hernia. Symptomatically somewhat better on omeprazole but not entirely. We'll try b.i.d. therapy   Edema    1+ in both ankles, likely multifactorial including medication such as Requip   Erectile dysfunction    Staxyn 10 mg or Viagra worked well. 3 samples of Cialis 20 mg provided   GERD (gastroesophageal reflux disease)    Hypercholesteremia    Hypertension    Nephrolithiasis    Onychomycosis of toenail    April 27, 2013 - Dr. Jonny Ruiz  [place] - podiatry, was in [place] - treating with oral Lamisil and topical nail therapy   Parkinson's disease     followed by Dr. Raquel Sarna at [facility] and Lesia Sago, M.D. in [facility]    and tinnitus - Dr. Serena Colonel - August/2013   Renal calculus    Syncope       Dispostion: I considered admission for this patient, and disposition pending completion of imaging studies.  Please see provider signout note for continuation of workup.     Final Clinical Impression(s) / ED Diagnoses Final diagnoses:  None     @PCDICTATION @    Glendora Score, MD 07/03/23 2025

## 2023-07-03 NOTE — ED Notes (Signed)
ED TO INPATIENT HANDOFF REPORT  Name/Age/Gender [name] 77 y.o. male  Code Status    Code Status Orders  (From admission, onward)           Start     Ordered   07/03/23 1715  Do not attempt resuscitation (DNR)- Limited -Do Not Intubate (DNI)  Continuous       Question Answer Comment  If pulseless and not breathing No CPR or chest compressions.   In Pre-Arrest Conditions (Patient Is Breathing and Has A Pulse) Do not intubate. Provide all appropriate non-invasive medical interventions. Avoid ICU transfer unless indicated or required.   Consent: Discussion documented in EHR or advanced directives reviewed      07/03/23 1714           Code Status History     Date Active Date Inactive Code Status Order ID Comments User Context   06/24/2023 0022 06/25/2023 1828 Limited: Do not attempt resuscitation (DNR) -DNR-LIMITED -Do Not Intubate/DNI  [id number]  Buena Irish, MD ED   04/01/2023 2122 04/04/2023 1841 Full Code [id number]  Alan Mulder, MD ED   01/11/2023 0945 01/11/2023 2038 Full Code [id number]  Kathleene Hazel, MD Inpatient   04/29/2022 2105 05/08/2022 2221 Full Code [id number]  Anselm Jungling, DO ED   04/20/2022 2155 04/22/2022 1752 Full Code [id number]  Alberteen Sam, MD Inpatient   03/01/2022 1641 03/07/2022 1950 Full Code [id number]  Alwyn Ren, MD ED   01/02/2022 1012 01/06/2022 0112 Full Code [id number]  Glade Lloyd, MD Inpatient   12/31/2021 1938 01/02/2022 1011 DNR [id number]  Teddy Spike, DO Inpatient      Advance Directive Documentation    Flowsheet Row Most Recent Value  Type of Advance Directive Healthcare Power of Attorney, Living will  Pre-existing out of facility DNR order (yellow form or pink MOST form) --  "MOST" Form in Place? --       Home/SNF/Other Home  Chief Complaint Gross hematuria [R31.0]  Level of Care/Admitting Diagnosis ED Disposition     ED Disposition  Admit   Condition  --   Comment  Hospital Area:  [facility] [[id number]]  Level of Care: Telemetry [5]  Admit to tele based on following criteria: Monitor for Ischemic changes  May admit patient to [facility] or [facility] if equivalent level of care is available:: Yes  Covid Evaluation: Asymptomatic - no recent exposure (last 10 days) testing not required  Diagnosis: Gross hematuria [599.71.ICD-9-CM]  Admitting Physician: Maryln Gottron [[id number]]  Attending Physician: Kirby Crigler, MIR [email or web address] [[id number]]  Certification:: I certify this patient will need inpatient services for at least 2 midnights  Expected Medical Readiness: 07/05/2023          Medical History Past Medical History:  Diagnosis Date   Abnormal involuntary movement 02/18/2018   Allergic rhinitis    Arthritis    BPH (benign prostatic hyperplasia)    Diabetes (HCC)    Dysphagia, pharyngeal phase    GERD diagnosed on barium swallow. Has small hiatal hernia. Symptomatically somewhat better on omeprazole but not entirely. We'll try b.i.d. therapy   Edema    1+ in both ankles, likely multifactorial including medication such as Requip   Erectile dysfunction    Staxyn 10 mg or Viagra worked well. 3 samples of Cialis 20 mg provided   GERD (gastroesophageal reflux disease)    Hypercholesteremia    Hypertension    Nephrolithiasis    Onychomycosis of toenail    April 27, 2013 - Dr. Merwyn Katos - podiatry, was in [place] - treating with oral Lamisil and topical nail therapy   Parkinson's disease     followed [facility] Raquel Sarna at [facility] and Lesia Sago, M.D. in [facility]    and tinnitus - Dr. Serena Colonel - August/2013   Renal calculus    Syncope     Allergies Allergies  Allergen Reactions   Nucynta [Tapentadol] Other (See Comments)    Made the patient not feel well. "He did not feel well at all."   Oxycodone Itching   Sudafed [Pseudoephedrine Hcl] Other (See Comments)    Problems urinating    Tramadol Hcl Itching and Other (See Comments)     Can tolerate, however (in 2023)    IV Location/Drains/Wounds Patient Lines/Drains/Airways Status     Active Line/Drains/Airways     Name Placement date Placement time Site Days   Peripheral IV 07/03/23 20 G 1" Anterior;Right Forearm 07/03/23  1421  Forearm  less than 1   Wound / Incision (Open or Dehisced) 03/01/22 Other (Comment) Knee Anterior;Left 03/01/22  1803  Knee  489   Wound / Incision (Open or Dehisced) 03/01/22 Other (Comment) Axilla Left 03/01/22  1804  Axilla  489   Wound / Incision (Open or Dehisced) 03/01/22 Hand Posterior;Right 03/01/22  1804  Hand  489   Wound / Incision (Open or Dehisced) 03/01/22 Other (Comment) Hand Left;Posterior 03/01/22  1805  Hand  489            Labs/Imaging Results for orders placed or performed during the hospital encounter of 07/03/23 (from the past 48 hour(s))  Comprehensive metabolic panel     Status: Abnormal   Collection Time: 07/03/23 11:26 AM  Result Value Ref Range   Sodium 140 135 - 145 mmol/L   Potassium 4.3 3.5 - 5.1 mmol/L   Chloride 104 98 - 111 mmol/L   CO2 25 22 - 32 mmol/L   Glucose, Bld 163 (H) 70 - 99 mg/dL    Comment: Glucose reference range applies only to samples taken after fasting for at least 8 hours.   BUN 33 (H) 8 - 23 mg/dL   Creatinine, Ser 5.62 (H) 0.61 - 1.24 mg/dL   Calcium 9.2 8.9 - 13.0 mg/dL   Total Protein 6.5 6.5 - 8.1 g/dL   Albumin 3.7 3.5 - 5.0 g/dL   AST 11 (L) 15 - 41 U/L   ALT <5 0 - 44 U/L   Alkaline Phosphatase 79 38 - 126 U/L   Total Bilirubin 0.7 0.3 - 1.2 mg/dL   GFR, Estimated 59 (L) >60 mL/min    Comment: (NOTE) Calculated using the CKD-EPI Creatinine Equation (2021)    Anion gap 11 5 - 15    Comment: Performed at [facility], [address] [place], [place] [address]  CBC with Differential     Status: None   Collection Time: 07/03/23 11:26 AM  Result Value Ref Range   WBC 7.1 4.0 - 10.5 K/uL   RBC 4.85 4.22 - 5.81 MIL/uL   Hemoglobin 14.3 13.0 -  17.0 g/dL   HCT 46.9 62.9 - 52.8 %   MCV 93.8 80.0 - 100.0 fL   MCH 29.5 26.0 - 34.0 pg   MCHC 31.4 30.0 - 36.0 g/dL   RDW 41.3 24.4 - 01.0 %   Platelets 244 150 - 400 K/uL   nRBC 0.0 0.0 - 0.2 %   Neutrophils Relative % 74 %  Neutro Abs 5.2 1.7 - 7.7 K/uL   Lymphocytes Relative 15 %   Lymphs Abs 1.1 0.7 - 4.0 K/uL   Monocytes Relative 9 %   Monocytes Absolute 0.6 0.1 - 1.0 K/uL   Eosinophils Relative 2 %   Eosinophils Absolute 0.1 0.0 - 0.5 K/uL   Basophils Relative 0 %   Basophils Absolute 0.0 0.0 - 0.1 K/uL   Immature Granulocytes 0 %   Abs Immature Granulocytes 0.02 0.00 - 0.07 K/uL    Comment: Performed at [facility], [address], [place], [place] [address]  Urinalysis, Routine w reflex microscopic -Urine, Clean Catch     Status: Abnormal   Collection Time: 07/03/23 11:26 AM  Result Value Ref Range   Color, Urine YELLOW YELLOW   APPearance HAZY (A) CLEAR   Specific Gravity, Urine 1.023 1.005 - 1.030   pH 5.0 5.0 - 8.0   Glucose, UA NEGATIVE NEGATIVE mg/dL   Hgb urine dipstick LARGE (A) NEGATIVE   Bilirubin Urine NEGATIVE NEGATIVE   Ketones, ur 5 (A) NEGATIVE mg/dL   Protein, ur 102 (A) NEGATIVE mg/dL   Nitrite NEGATIVE NEGATIVE   Leukocytes,Ua NEGATIVE NEGATIVE   RBC / HPF >50 0 - 5 RBC/hpf   WBC, UA 6-10 0 - 5 WBC/hpf   Bacteria, UA RARE (A) NONE SEEN   Squamous Epithelial / HPF 0-5 0 - 5 /HPF   Mucus PRESENT     Comment: Performed at [facility], [address] [place], [place] [address]   CT HEAD WO CONTRAST ( )  Result Date: 07/03/2023 CLINICAL DATA:  Minor head trauma EXAM: CT HEAD WITHOUT CONTRAST TECHNIQUE: Contiguous axial images were obtained from the base of the skull through the vertex without intravenous contrast. RADIATION DOSE REDUCTION: This exam was performed according to the departmental dose-optimization program which includes automated exposure control, adjustment of the mA and/or kV according to  patient size and/or use of iterative reconstruction technique. COMPARISON:  06/23/2023 FINDINGS: Brain: No acute infarct or hemorrhage. Lateral ventricles and midline structures are stable. No acute extra-axial fluid collections. No mass effect. Vascular: No hyperdense vessel or unexpected calcification. Skull: Normal. Negative for fracture or focal lesion. Sinuses/Orbits: No acute finding. Other: None. IMPRESSION: 1. No acute intracranial process. Electronically Signed   By: Sharlet Salina M.D.   On: 07/03/2023 15:28   CT CHEST ABDOMEN PELVIS W CONTRAST  Result Date: 07/03/2023 CLINICAL DATA:  Larey Seat, left-sided pain, gross hematuria EXAM: CT CHEST, ABDOMEN, AND PELVIS WITH CONTRAST TECHNIQUE: Multidetector CT imaging of the chest, abdomen and pelvis was performed following the standard protocol during bolus administration of intravenous contrast. RADIATION DOSE REDUCTION: This exam was performed according to the departmental dose-optimization program which includes automated exposure control, adjustment of the mA and/or kV according to patient size and/or use of iterative reconstruction technique. CONTRAST:  OMNIPAQUE IOHEXOL 300 MG/ML  SOLN COMPARISON:  06/14/2022 and previous FINDINGS: CT CHEST FINDINGS Cardiovascular: SVC patent. Heart size upper limits normal. No pericardial effusion. 3-vessel coronary calcifications. Central great vessels unremarkable. Mediastinum/Nodes: No mediastinal hematoma, mass, or adenopathy. Lungs/Pleura: No pleural effusion. No pneumothorax. Dependent atelectasis/infiltrate in both lung bases, left greater than right. Musculoskeletal: Minimally displaced fracture, anterolateral aspect left sixth rib. Healing fractures of left seventh and eighth ribs laterally. CT ABDOMEN PELVIS FINDINGS Hepatobiliary: 2 cm lamellated gallstone near the gallbladder neck. Gallbladder is nondilated. No focal liver lesion or biliary ductal dilatation. Pancreas: Unremarkable. No pancreatic ductal  dilatation or surrounding inflammatory changes. Spleen:  Normal in size without focal abnormality. Adrenals/Urinary Tract: No adrenal mass. Scattered areas of renal parenchymal loss. 4 mm calculus peripherally mid left kidney. No hydronephrosis. Urinary bladder physiologically distended. Stomach/Bowel: Stomach and small bowel decompressed. Appendix not identified. The colon is partially distended, without acute finding. Vascular/Lymphatic: Mild scattered aortoiliac calcified plaque. No AAA. Portal vein patent. No abdominal or pelvic adenopathy. Reproductive: Mild prostate enlargement. Other: Bilateral pelvic phleboliths.  No ascites.  No free air. Musculoskeletal: Chronic L1 compression fracture deformity. Stable grade 1 anterolisthesis L4-5. IMPRESSION: 1. Minimally displaced left sixth rib fracture, without pneumothorax or effusion. 2. Healing left seventh and eighth rib fractures. 3. Cholelithiasis. 4. Left nephrolithiasis without hydronephrosis. 5. Chronic L1 compression fracture deformity. 6. Coronary and aortic Atherosclerosis (ICD10-I70.0). Electronically Signed   By: Corlis Leak M.D.   On: 07/03/2023 15:24    Pending Labs Unresulted Labs (From admission, onward)     Start     Ordered   07/04/23 0500  Basic metabolic panel  Tomorrow morning,   R        07/03/23 1714   07/04/23 0500  CBC  Tomorrow morning,   R        07/03/23 1714            Vitals/Pain Today's Vitals   07/03/23 1444 07/03/23 1445 07/03/23 1545 07/03/23 1600  BP:  136/65 135/66   Pulse:  (!) 46 (!) 50   Resp:  16 16 16   Temp: 98 F (36.7 C)     TempSrc: Oral     SpO2:  95% 95%   Weight:      Height:      PainSc:        Isolation Precautions No active isolations  Medications Medications  lidocaine (LIDODERM) 5 % 1 patch (has no administration in time range)  acetaminophen (TYLENOL) tablet 650 mg (has no administration in time range)  methocarbamol (ROBAXIN) tablet 500 mg (has no administration in time range)   traMADol (ULTRAM) tablet 50 mg (has no administration in time range)  aspirin EC tablet 81 mg (has no administration in time range)  sacubitril-valsartan (ENTRESTO) 24-26 mg per tablet (has no administration in time range)  rosuvastatin (CRESTOR) tablet 40 mg (has no administration in time range)  furosemide (LASIX) tablet 20 mg (has no administration in time range)  sertraline (ZOLOFT) tablet 50 mg (has no administration in time range)  pantoprazole (PROTONIX) EC tablet 40 mg (has no administration in time range)  tamsulosin (FLOMAX) capsule 0.4 mg (has no administration in time range)  clopidogrel (PLAVIX) tablet 75 mg (has no administration in time range)  insulin aspart (novoLOG) injection 0-15 Units (has no administration in time range)  insulin aspart (novoLOG) injection 0-5 Units (has no administration in time range)  acetaminophen (TYLENOL) tablet 650 mg (has no administration in time range)    Or  acetaminophen (TYLENOL) suppository 650 mg (has no administration in time range)  traZODone (DESYREL) tablet 25 mg (has no administration in time range)  ondansetron (ZOFRAN) tablet 4 mg (has no administration in time range)    Or  ondansetron (ZOFRAN) injection 4 mg (has no administration in time range)  albuterol (PROVENTIL) (2.5 MG/3ML) 0.083% nebulizer solution 2.5 mg (has no administration in time range)  HYDROmorphone (DILAUDID) injection 1 mg (has no administration in time range)  insulin glargine-yfgn (SEMGLEE) injection 8 Units (has no administration in time range)  iohexol (OMNIPAQUE) 300 MG/ML solution 100 mL (100 mLs Intravenous Contrast Given 07/03/23 1421)  fentaNYL (  SUBLIMAZE) injection 50 mcg (50 mcg Intravenous Given 07/03/23 1613)  sodium chloride 0.9 % bolus 500 mL (500 mLs Intravenous New Bag/Given 07/03/23 1612)  ondansetron (ZOFRAN) injection 4 mg (4 mg Intravenous Given 07/03/23 1609)    Mobility non-ambulatory

## 2023-07-03 NOTE — H&P (Addendum)
History and Physical  [name] ZOX:[id number] DOB: 1946-04-09 DOA: 07/03/2023  PCP: Eric Aspen, MD   Chief Complaint: fall in shower   HPI: [name] is a 77 y.o. male with medical history significant for heart failure with reduced EF, recent hospital stay for suspected ESBL UTI, CAD status post coronary stent March 2024 on aspirin and Plavix, insulin-dependent type 2 diabetes, hypertension, Parkinson's disease, hyperlipidemia, GERD being admitted to the hospital with left-sided acute rib fracture and gross hematuria after a mechanical fall in the shower day before last.  Did not lose consciousness, does not think he hit his head.  He is having some left-sided chest pain.  He decided to come to the hospital because he was having some gross hematuria, though it has cleared up from yesterday according to his family.  In the emergency department, vital signs are unremarkable, he is saturating well on room air.  Lab work shows a slight increase in his creatinine to 1.27 from normal baseline, otherwise CBC and CMP are unremarkable.  Urinalysis was completed, shows evidence of microscopic hematuria as well.  As detailed below, he had CT scan of the head which was unremarkable, as well as CT of the chest abdomen pelvis which shows an acute left sixth rib fracture, and other prior well-healed rib fractures.  He was seen by general surgery, who recommends observation in the hospital, and that the patient can be monitored here at [facility].  Review of Systems: Please see HPI for pertinent positives and negatives. A complete 10 system review of systems are otherwise negative.  Past Medical History:  Diagnosis Date   Abnormal involuntary movement 02/18/2018   Allergic rhinitis    Arthritis    BPH (benign prostatic hyperplasia)    Diabetes (HCC)    Dysphagia, pharyngeal phase    GERD diagnosed on barium swallow. Has small hiatal hernia. Symptomatically somewhat better on omeprazole but not  entirely. We'll try b.i.d. therapy   Edema    1+ in both ankles, likely multifactorial including medication such as Requip   Erectile dysfunction    Staxyn 10 mg or Viagra worked well. 3 samples of Cialis 20 mg provided   GERD (gastroesophageal reflux disease)    Hypercholesteremia    Hypertension    Nephrolithiasis    Onychomycosis of toenail    April 27, 2013 - Dr. Merwyn Katos - podiatry, was in [place] - treating with oral Lamisil and topical nail therapy   Parkinson's disease     followed by Dr. Raquel Sarna at [facility] and Lesia Sago, M.D. in [place]   Presbycusis    and tinnitus - Dr. Serena Colonel - August/2013   Renal calculus    Syncope    Past Surgical History:  Procedure Laterality Date   CORONARY STENT INTERVENTION N/A 01/11/2023   Procedure: CORONARY STENT INTERVENTION;  Surgeon: Kathleene Hazel, MD;  Location: [facility];  Service: Cardiovascular;  Laterality: N/A;   HAND SURGERY     INGUINAL HERNIA REPAIR Left 11/04/2015   Procedure: LEFT INGUINAL HERNIA REPAIR WITH MESH;  Surgeon: Darnell Level, MD;  Location: [facility];  Service: General;  Laterality: Left;   INSERTION OF MESH Left 11/04/2015   Procedure: INSERTION OF MESH;  Surgeon: Darnell Level, MD;  Location: [facility];  Service: General;  Laterality: Left;   JOINT REPLACEMENT Bilateral    KNEE SURGERY     LEFT HEART CATH AND CORONARY ANGIOGRAPHY N/A 01/11/2023  Procedure: LEFT HEART CATH AND CORONARY ANGIOGRAPHY;  Surgeon: Kathleene Hazel, MD;  Location: [facility];  Service: Cardiovascular;  Laterality: N/A;   TOTAL KNEE ARTHROPLASTY      Social History:  reports that he has never smoked. He has never used smokeless tobacco. He reports that he does not drink alcohol and does not use drugs.   Allergies  Allergen Reactions   Nucynta [Tapentadol] Other (See Comments)    Made the patient not feel well. "He did not feel well at all."   Oxycodone Itching    Sudafed [Pseudoephedrine Hcl] Other (See Comments)    Problems urinating    Tramadol Hcl Itching and Other (See Comments)    Can tolerate, however (in 2023)    Family History  Problem Relation Age of Onset   Atrial fibrillation Mother    Breast cancer Mother    Emphysema Father    Heart disease Father    Cancer Brother        African Burk[place]     Prior to Admission medications   Medication Sig Start Date End Date Taking? Authorizing Provider  acetaminophen (TYLENOL) 500 MG tablet Take 500-1,000 mg by mouth every 6 (six) hours as needed (for pain).    [provider]  aspirin EC 81 MG tablet Take 81 mg by mouth daily. Swallow whole.    [provider]  calcium carbonate (TUMS) 500 MG chewable tablet Chew 2 tablets by mouth daily as needed for indigestion or heartburn.    [provider]  carbidopa-levodopa (SINEMET IR) 25-100 MG tablet TAKE 3 TABLETS BY MOUTH AT 7:00 AM, 3 TABS AT 10:00 AM, 3 TABS AT 12:00 PM, 3 TABS AT 2:00 PM, 3 TABS AT 4:00, AND 3 TABS AT 6:00 PM 06/27/23   [name], Octaviano Batty, DO  Cholecalciferol (VITAMIN D) 50 MCG (2000 UT) tablet Take 2,000 Units by mouth daily.    [provider]  clopidogrel (PLAVIX) 75 MG tablet Take 1 tablet (75 mg total) by mouth daily with breakfast. 01/12/23   Laverda Page B, NP  Continuous Glucose Sensor (DEXCOM G7 SENSOR) MISC 1 each by Does not apply route as needed. 04/04/23   Lewie Chamber, MD  entacapone (COMTAN) 200 MG tablet TAKE 1 TABLET BY MOUTH 3 TIMES DAILY (WITH FIRST THREE DOSAGES OF CARBIDOPA/LEVODOPA). 06/04/23   [name], Rebecca S, DO  ENTRESTO 24-26 MG TAKE 1 TABLET BY MOUTH 2 TIMES DAILY. 02/11/23   Jake Bathe, MD  furosemide (LASIX) 20 MG tablet Take 2 tablets (40 mg total) by mouth daily. Patient taking differently: Take 20 mg by mouth 2 (two) times daily. 01/02/23   Duke Salvia, MD  GLOBAL EASE INJECT PEN NEEDLES 31G X 5 MM MISC Inject into the skin. 05/09/21   [provider]   insulin lispro (HUMALOG) 100 UNIT/ML KwikPen Inject 2-15 Units into the skin 4 (four) times daily -  before meals and at bedtime. CBG 121 - 150: 2 units, CBG 151 - 200: 3 units, CBG 201 - 250: 5 units, CBG 251 - 300: 8 units, CBG 301 - 350: 11 units, CBG 351 - 400: 15 units Patient taking differently: Inject 2-3 Units into the skin 4 (four) times daily -  before meals and at bedtime. CBG Sliding Scale 04/04/23   Lewie Chamber, MD  LANTUS SOLOSTAR 100 UNIT/ML Solostar Pen Inject 25 Units into the skin at bedtime. 04/04/23   Lewie Chamber, MD  methenamine (HIPREX) 1 g tablet TAKE 1  TABLET BY MOUTH 2 TIMES DAILY. TAKE WITH ANY VITAMIN C, TWICE A DAY FOR UTI PREVENTION Patient taking differently: Take 1 g by mouth 2 (two) times daily with a meal. 03/28/23   Vu, Gershon Mussel T, MD  nitroGLYCERIN (NITROSTAT) 0.4 MG SL tablet Place 1 tablet (0.4 mg total) under the tongue every 5 (five) minutes as needed. 01/11/23   Arty Baumgartner, NP  pantoprazole (PROTONIX) 40 MG tablet TAKE 1 TABLET (40 MG TOTAL) BY MOUTH DAILY 06/26/23   Jake Bathe, MD  potassium chloride (KLOR-CON) 10 MEQ tablet Take 10 mEq by mouth 2 (two) times daily. 05/18/22   [provider]  rOPINIRole (REQUIP) 3 MG tablet Take 1.5-3 mg by mouth at bedtime. Take 1 in the morning, Take 1 in afternoon and Take 1/2 tablet in the evening    [provider]  rosuvastatin (CRESTOR) 40 MG tablet Take 1 tablet (40 mg total) by mouth daily. 04/16/23   Jake Bathe, MD  sertraline (ZOLOFT) 50 MG tablet Take 50 mg by mouth daily.    [provider]  tamsulosin (FLOMAX) 0.4 MG CAPS capsule Take 0.4 mg by mouth in the morning and at bedtime. 01/24/18   [provider]  triamcinolone (NASACORT) 55 MCG/ACT AERO nasal inhaler Place 2 sprays into the nose daily as needed (for allergies or rhinitis).    [provider]  vitamin B-12 (CYANOCOBALAMIN) 1000 MCG tablet Take 2,000 mcg by mouth daily.    [provider]     Physical Exam: BP 135/66   Pulse (!) 50   Temp 98 F (36.7 C) (Oral)   Resp 16   Ht 5\' 8"  (1.727 m)   Wt 87.3 kg   SpO2 95%   BMI 29.27 kg/m   General:  Alert, oriented, calm, in no acute distress, pleasant and cooperative, looks comfortable, wife and son at the bedside. Eyes: EOMI, clear conjuctivae, white sclerea Neck: supple, no masses, trachea mildline  Cardiovascular: RRR, no murmurs or rubs, no peripheral edema, he has tenderness on the left flank/chest wall, with no obvious bruising or injury seen Respiratory: clear to auscultation bilaterally, no wheezes, no crackles  Abdomen: soft, nontender, nondistended, normal bowel tones heard  Skin: dry, no rashes  Musculoskeletal: no joint effusions, normal range of motion  Psychiatric: appropriate affect, normal speech  Neurologic: extraocular muscles intact, clear speech, moving all extremities with intact sensorium         Labs on Admission:  Basic Metabolic Panel: Recent Labs  Lab 07/03/23 1126  NA 140  K 4.3  CL 104  CO2 25  GLUCOSE 163*  BUN 33*  CREATININE 1.27*  CALCIUM 9.2   Liver Function Tests: Recent Labs  Lab 07/03/23 1126  AST 11*  ALT <5  ALKPHOS 79  BILITOT 0.7  PROT 6.5  ALBUMIN 3.7   No results for input(s): "LIPASE", "AMYLASE" in the last 168 hours. No results for input(s): "AMMONIA" in the last 168 hours. CBC: Recent Labs  Lab 07/03/23 1126  WBC 7.1  NEUTROABS 5.2  HGB 14.3  HCT 45.5  MCV 93.8  PLT 244   Cardiac Enzymes: No results for input(s): "CKTOTAL", "CKMB", "CKMBINDEX", "TROPONINI" in the last 168 hours.  BNP (last 3 results) Recent Labs    04/02/23 1752  BNP 660.0*    ProBNP (last 3 results) No results for input(s): "PROBNP" in the last 8760 hours.  CBG: No results for input(s): "GLUCAP" in the last 168 hours.  Radiological Exams on  Admission: CT HEAD WO CONTRAST ( )  Result Date: 07/03/2023 CLINICAL DATA:  Minor head trauma EXAM: CT HEAD WITHOUT  CONTRAST TECHNIQUE: Contiguous axial images were obtained from the base of the skull through the vertex without intravenous contrast. RADIATION DOSE REDUCTION: This exam was performed according to the departmental dose-optimization program which includes automated exposure control, adjustment of the mA and/or kV according to patient size and/or use of iterative reconstruction technique. COMPARISON:  06/23/2023 FINDINGS: Brain: No acute infarct or hemorrhage. Lateral ventricles and midline structures are stable. No acute extra-axial fluid collections. No mass effect. Vascular: No hyperdense vessel or unexpected calcification. Skull: Normal. Negative for fracture or focal lesion. Sinuses/Orbits: No acute finding. Other: None. IMPRESSION: 1. No acute intracranial process. Electronically Signed   By: Sharlet Salina M.D.   On: 07/03/2023 15:28   CT CHEST ABDOMEN PELVIS W CONTRAST  Result Date: 07/03/2023 CLINICAL DATA:  Larey Seat, left-sided pain, gross hematuria EXAM: CT CHEST, ABDOMEN, AND PELVIS WITH CONTRAST TECHNIQUE: Multidetector CT imaging of the chest, abdomen and pelvis was performed following the standard protocol during bolus administration of intravenous contrast. RADIATION DOSE REDUCTION: This exam was performed according to the departmental dose-optimization program which includes automated exposure control, adjustment of the mA and/or kV according to patient size and/or use of iterative reconstruction technique. CONTRAST:  OMNIPAQUE IOHEXOL 300 MG/ML  SOLN COMPARISON:  06/14/2022 and previous FINDINGS: CT CHEST FINDINGS Cardiovascular: SVC patent. Heart size upper limits normal. No pericardial effusion. 3-vessel coronary calcifications. Central great vessels unremarkable. Mediastinum/Nodes: No mediastinal hematoma, mass, or adenopathy. Lungs/Pleura: No pleural effusion. No pneumothorax. Dependent atelectasis/infiltrate in both lung bases, left greater than right. Musculoskeletal: Minimally displaced  fracture, anterolateral aspect left sixth rib. Healing fractures of left seventh and eighth ribs laterally. CT ABDOMEN PELVIS FINDINGS Hepatobiliary: 2 cm lamellated gallstone near the gallbladder neck. Gallbladder is nondilated. No focal liver lesion or biliary ductal dilatation. Pancreas: Unremarkable. No pancreatic ductal dilatation or surrounding inflammatory changes. Spleen: Normal in size without focal abnormality. Adrenals/Urinary Tract: No adrenal mass. Scattered areas of renal parenchymal loss. 4 mm calculus peripherally mid left kidney. No hydronephrosis. Urinary bladder physiologically distended. Stomach/Bowel: Stomach and small bowel decompressed. Appendix not identified. The colon is partially distended, without acute finding. Vascular/Lymphatic: Mild scattered aortoiliac calcified plaque. No AAA. Portal vein patent. No abdominal or pelvic adenopathy. Reproductive: Mild prostate enlargement. Other: Bilateral pelvic phleboliths.  No ascites.  No free air. Musculoskeletal: Chronic L1 compression fracture deformity. Stable grade 1 anterolisthesis L4-5. IMPRESSION: 1. Minimally displaced left sixth rib fracture, without pneumothorax or effusion. 2. Healing left seventh and eighth rib fractures. 3. Cholelithiasis. 4. Left nephrolithiasis without hydronephrosis. 5. Chronic L1 compression fracture deformity. 6. Coronary and aortic Atherosclerosis (ICD10-I70.0). Electronically Signed   By: Corlis Leak M.D.   On: 07/03/2023 15:24    Assessment/Plan [name] is a 77 y.o. male with medical history significant for heart failure with reduced EF, recent hospital stay for suspected ESBL UTI, CAD status post coronary stent March 2024 on aspirin and Plavix, insulin-dependent type 2 diabetes, hypertension, Parkinson's disease, hyperlipidemia, GERD being admitted to the hospital with left-sided acute rib fracture and gross hematuria after a mechanical fall in the shower day before last.   Left sixth rib  fracture-with minimal displacement, no associated hematoma or pneumothorax. -Inpatient admission -Pain control with tramadol, lidocaine patch, IV Dilaudid for severe pain -Appreciate trauma surgery consultation, they will follow  Gross hematuria-after recent trauma, patient is urinating well without current evidence of retention.  Family states urine seems to be clearing up and less bloody. -Patient is hemodynamically stable without anemia -Discussed with Dr. Laverle Patter of urology, who recommends no Foley catheter, okay to continue antiplatelet therapy for now -Dr. Laverle Patter will follow along -Foley and CBI in case of retention  Heart failure with reduced EF-patient currently appears euvolemic -Continue home dose Lasix -Continue Entresto and metoprolol which per family is being tapered off  Insulin-dependent type 2 diabetes-poorly controlled with hemoglobin A1c 11% in June 2024 -Carb controlled diet when eating -Reduced dose Lantus nightly -Moderate sliding scale  Parkinson's-continue Sinemet  CAD-status post PCI in March -Continue aspirin and Plavix -May have to consider holding antiplatelets in case of severe bleeding  Acute renal failure-mild, possibly due to mild dehydration since his fall, or intermittent urinary retention due to his gross hematuria.  Will continue his baseline medications, but monitor renal function closely and avoid nephrotoxins if renal function not improving.  Subconjunctival hemorrhage-likely due to Plavix use, followed by Dr. Dione Booze, continue supportive care  Depression-sertraline  DVT prophylaxis: SCDs only     Code Status: Limited: Do not attempt resuscitation (DNR) -DNR-LIMITED -Do Not Intubate/DNI   Consults called: Urology, general surgery  Admission status: The appropriate patient status for this patient is INPATIENT. Inpatient status is judged to be reasonable and necessary in order to provide the required intensity of service to ensure the patient's  safety. The patient's presenting symptoms, physical exam findings, and initial radiographic and laboratory data in the context of their chronic comorbidities is felt to place them at high risk for further clinical deterioration. Furthermore, it is not anticipated that the patient will be medically stable for discharge from the hospital within 2 midnights of admission.    I certify that at the point of admission it is my clinical judgment that the patient will require inpatient hospital care spanning beyond 2 midnights from the point of admission due to high intensity of service, high risk for further deterioration and high frequency of surveillance required  Time spent: 59 minutes  Eric Burdell Sharlette Dense MD [facility] Pager [phone]  If 7PM-7AM, please contact night-coverage [email or web address] Password [facility]  07/03/2023, 5:16 PM

## 2023-07-03 NOTE — Consult Note (Signed)
Consult Note  [name] 02-25-1946  [id number].    Requesting MD: Charlynne Pander, MD Chief Complaint/Reason for Consult: rib fracture  HPI:  Patient is a 77 year old male who presented to the ED s/p falling in the shower ~48 hrs ago and landing on the left side. He reported worsening left flank pain and hematuria with clots. Patient on plavix. He denied LOC, chest pain, SOB, headache, fever. He has PMH of Parkinson's, CAD with stent, CHF last EF45%, HTN, HLD, dysphagia, T2DM, chronic urinary retention with recurrent ESBL UTIs, BPH, GERD and depression. Medication intolerances listed to oxycodone, tramadol, nucynta and sudafed. Trauma consulted for single rib fracture, old rib fractures noted on CT as well.   ROS: Negative other than HPI  Family History  Problem Relation Age of Onset   Atrial fibrillation Mother    Breast cancer Mother    Emphysema Father    Heart disease Father    Cancer Brother        African Burk[place]    Past Medical History:  Diagnosis Date   Abnormal involuntary movement 02/18/2018   Allergic rhinitis    Arthritis    BPH (benign prostatic hyperplasia)    Diabetes (HCC)    Dysphagia, pharyngeal phase    GERD diagnosed on barium swallow. Has small hiatal hernia. Symptomatically somewhat better on omeprazole but not entirely. We'll try b.i.d. therapy   Edema    1+ in both ankles, likely multifactorial including medication such as Requip   Erectile dysfunction    Staxyn 10 mg or Viagra worked well. 3 samples of Cialis 20 mg provided   GERD (gastroesophageal reflux disease)    Hypercholesteremia    Hypertension    Nephrolithiasis    Onychomycosis of toenail    April 27, 2013 - Dr. Merwyn Katos - podiatry, was in [place] - treating with oral Lamisil and topical nail therapy   Parkinson's disease     followed by Dr. Raquel Sarna at [facility] and Lesia Sago, M.D. in [place]   Presbycusis    and tinnitus - Dr. Serena Colonel - August/2013   Renal  calculus    Syncope     Past Surgical History:  Procedure Laterality Date   CORONARY STENT INTERVENTION N/A 01/11/2023   Procedure: CORONARY STENT INTERVENTION;  Surgeon: Kathleene Hazel, MD;  Location: [facility];  Service: Cardiovascular;  Laterality: N/A;   HAND SURGERY     INGUINAL HERNIA REPAIR Left 11/04/2015   Procedure: LEFT INGUINAL HERNIA REPAIR WITH MESH;  Surgeon: Darnell Level, MD;  Location: [facility];  Service: General;  Laterality: Left;   INSERTION OF MESH Left 11/04/2015   Procedure: INSERTION OF MESH;  Surgeon: Darnell Level, MD;  Location: [facility];  Service: General;  Laterality: Left;   JOINT REPLACEMENT Bilateral    KNEE SURGERY     LEFT HEART CATH AND CORONARY ANGIOGRAPHY N/A 01/11/2023   Procedure: LEFT HEART CATH AND CORONARY ANGIOGRAPHY;  Surgeon: Kathleene Hazel, MD;  Location: [facility];  Service: Cardiovascular;  Laterality: N/A;   TOTAL KNEE ARTHROPLASTY      Social History:  reports that he has never smoked. He has never used smokeless tobacco. He reports that he does not drink alcohol and does not use drugs.  Allergies:  Allergies  Allergen Reactions   Nucynta [Tapentadol] Other (See Comments)    Made the patient not feel well. "He did not feel well  at all."   Oxycodone Itching   Sudafed [Pseudoephedrine Hcl] Other (See Comments)    Problems urinating    Tramadol Hcl Itching and Other (See Comments)    Can tolerate, however (in 2023)    (Not in a hospital admission)   Blood pressure 135/66, pulse (!) 50, temperature 98 F (36.7 C), temperature source Oral, resp. rate 16, height 5\' 8"  (1.727 m), weight 87.3 kg, SpO2 95%. Physical Exam:  General: pleasant, WD, male who is laying in bed in NAD HEENT: head is normocephalic, atraumatic.  Sclera are noninjected.  PERRL.  Ears and nose without any masses or lesions.  Mouth is pink and moist. Left scleral hemorrhage and periorbital ecchymosis  noted (not acute per patient). Neck nontender, no neck pain with neck ROM Heart: regular, rate, and rhythm Lungs: CTAB, no wheezes, rhonchi, or rales noted.  Respiratory effort nonlabored on room air Abd: soft, NT, ND, +BS, no masses, hernias, or organomegaly MS: all 4 extremities are symmetrical with no cyanosis, clubbing, or edema. Skin: warm and dry with no masses, lesions, or rashes Neuro: Cranial nerves 2-12 grossly intact, sensation is normal throughout Psych: A&Ox3 with an appropriate affect.   Results for orders placed or performed during the hospital encounter of 07/03/23 (from the past 48 hour(s))  Comprehensive metabolic panel     Status: Abnormal   Collection Time: 07/03/23 11:26 AM  Result Value Ref Range   Sodium 140 135 - 145 mmol/L   Potassium 4.3 3.5 - 5.1 mmol/L   Chloride 104 98 - 111 mmol/L   CO2 25 22 - 32 mmol/L   Glucose, Bld 163 (H) 70 - 99 mg/dL    Comment: Glucose reference range applies only to samples taken after fasting for at least 8 hours.   BUN 33 (H) 8 - 23 mg/dL   Creatinine, Ser 6.29 (H) 0.61 - 1.24 mg/dL   Calcium 9.2 8.9 - 52.8 mg/dL   Total Protein 6.5 6.5 - 8.1 g/dL   Albumin 3.7 3.5 - 5.0 g/dL   AST 11 (L) 15 - 41 U/L   ALT <5 0 - 44 U/L   Alkaline Phosphatase 79 38 - 126 U/L   Total Bilirubin 0.7 0.3 - 1.2 mg/dL   GFR, Estimated 59 (L) >60 mL/min    Comment: (NOTE) Calculated using the CKD-EPI Creatinine Equation (2021)    Anion gap 11 5 - 15    Comment: Performed at [facility], [address] [place], [place] [address]  CBC with Differential     Status: None   Collection Time: 07/03/23 11:26 AM  Result Value Ref Range   WBC 7.1 4.0 - 10.5 K/uL   RBC 4.85 4.22 - 5.81 MIL/uL   Hemoglobin 14.3 13.0 - 17.0 g/dL   HCT 40.1 02.7 - 25.3 %   MCV 93.8 80.0 - 100.0 fL   MCH 29.5 26.0 - 34.0 pg   MCHC 31.4 30.0 - 36.0 g/dL   RDW 66.4 40.3 - 47.4 %   Platelets 244 150 - 400 K/uL   nRBC 0.0 0.0 - 0.2 %   Neutrophils  Relative % 74 %   Neutro Abs 5.2 1.7 - 7.7 K/uL   Lymphocytes Relative 15 %   Lymphs Abs 1.1 0.7 - 4.0 K/uL   Monocytes Relative 9 %   Monocytes Absolute 0.6 0.1 - 1.0 K/uL   Eosinophils Relative 2 %   Eosinophils Absolute 0.1 0.0 - 0.5 K/uL   Basophils Relative 0 %  Basophils Absolute 0.0 0.0 - 0.1 K/uL   Immature Granulocytes 0 %   Abs Immature Granulocytes 0.02 0.00 - 0.07 K/uL    Comment: Performed at [facility], [address] [place], [place] [address]  Urinalysis, Routine w reflex microscopic -Urine, Clean Catch     Status: Abnormal   Collection Time: 07/03/23 11:26 AM  Result Value Ref Range   Color, Urine YELLOW YELLOW   APPearance HAZY (A) CLEAR   Specific Gravity, Urine 1.023 1.005 - 1.030   pH 5.0 5.0 - 8.0   Glucose, UA NEGATIVE NEGATIVE mg/dL   Hgb urine dipstick LARGE (A) NEGATIVE   Bilirubin Urine NEGATIVE NEGATIVE   Ketones, ur 5 (A) NEGATIVE mg/dL   Protein, ur 846 (A) NEGATIVE mg/dL   Nitrite NEGATIVE NEGATIVE   Leukocytes,Ua NEGATIVE NEGATIVE   RBC / HPF >50 0 - 5 RBC/hpf   WBC, UA 6-10 0 - 5 WBC/hpf   Bacteria, UA RARE (A) NONE SEEN   Squamous Epithelial / HPF 0-5 0 - 5 /HPF   Mucus PRESENT     Comment: Performed at [facility], [address], [place], [place] [address]   CT HEAD WO CONTRAST ( )  Result Date: 07/03/2023 CLINICAL DATA:  Minor head trauma EXAM: CT HEAD WITHOUT CONTRAST TECHNIQUE: Contiguous axial images were obtained from the base of the skull through the vertex without intravenous contrast. RADIATION DOSE REDUCTION: This exam was performed according to the departmental dose-optimization program which includes automated exposure control, adjustment of the mA and/or kV according to patient size and/or use of iterative reconstruction technique. COMPARISON:  06/23/2023 FINDINGS: Brain: No acute infarct or hemorrhage. Lateral ventricles and midline structures are stable. No acute extra-axial fluid  collections. No mass effect. Vascular: No hyperdense vessel or unexpected calcification. Skull: Normal. Negative for fracture or focal lesion. Sinuses/Orbits: No acute finding. Other: None. IMPRESSION: 1. No acute intracranial process. Electronically Signed   By: Sharlet Salina M.D.   On: 07/03/2023 15:28   CT CHEST ABDOMEN PELVIS W CONTRAST  Result Date: 07/03/2023 CLINICAL DATA:  Larey Seat, left-sided pain, gross hematuria EXAM: CT CHEST, ABDOMEN, AND PELVIS WITH CONTRAST TECHNIQUE: Multidetector CT imaging of the chest, abdomen and pelvis was performed following the standard protocol during bolus administration of intravenous contrast. RADIATION DOSE REDUCTION: This exam was performed according to the departmental dose-optimization program which includes automated exposure control, adjustment of the mA and/or kV according to patient size and/or use of iterative reconstruction technique. CONTRAST:  OMNIPAQUE IOHEXOL 300 MG/ML  SOLN COMPARISON:  06/14/2022 and previous FINDINGS: CT CHEST FINDINGS Cardiovascular: SVC patent. Heart size upper limits normal. No pericardial effusion. 3-vessel coronary calcifications. Central great vessels unremarkable. Mediastinum/Nodes: No mediastinal hematoma, mass, or adenopathy. Lungs/Pleura: No pleural effusion. No pneumothorax. Dependent atelectasis/infiltrate in both lung bases, left greater than right. Musculoskeletal: Minimally displaced fracture, anterolateral aspect left sixth rib. Healing fractures of left seventh and eighth ribs laterally. CT ABDOMEN PELVIS FINDINGS Hepatobiliary: 2 cm lamellated gallstone near the gallbladder neck. Gallbladder is nondilated. No focal liver lesion or biliary ductal dilatation. Pancreas: Unremarkable. No pancreatic ductal dilatation or surrounding inflammatory changes. Spleen: Normal in size without focal abnormality. Adrenals/Urinary Tract: No adrenal mass. Scattered areas of renal parenchymal loss. 4 mm calculus peripherally mid left  kidney. No hydronephrosis. Urinary bladder physiologically distended. Stomach/Bowel: Stomach and small bowel decompressed. Appendix not identified. The colon is partially distended, without acute finding. Vascular/Lymphatic: Mild scattered aortoiliac calcified plaque. No AAA. Portal vein patent. No abdominal  or pelvic adenopathy. Reproductive: Mild prostate enlargement. Other: Bilateral pelvic phleboliths.  No ascites.  No free air. Musculoskeletal: Chronic L1 compression fracture deformity. Stable grade 1 anterolisthesis L4-5. IMPRESSION: 1. Minimally displaced left sixth rib fracture, without pneumothorax or effusion. 2. Healing left seventh and eighth rib fractures. 3. Cholelithiasis. 4. Left nephrolithiasis without hydronephrosis. 5. Chronic L1 compression fracture deformity. 6. Coronary and aortic Atherosclerosis (ICD10-I70.0). Electronically Signed   By: Corlis Leak M.D.   On: 07/03/2023 15:24      Assessment/Plan Single left 6th rib fracture - no signs of PTX or HTX - recommend IS, pulm toilet, multimodal pain control (scheduled lidocaine patch, tylenol and robaxin. Tramadol PRN) - PT/OT Hematuria - Reviewed with trauma MD. recommend urology consult, bladder injury unlikely given mechanism and no renal injury noted on CT AP  CTH negative for acute intracranial injury, CT CAP with healing left 7-8 rib fractures and chronic L1 compression fracture. He did not get a CT C-spine but is not having any neck pain so I think it is ok to hold on this.  FEN: ok for diet from trauma standpoint  VTE: ok to continue plavix ID: none needed from trauma standpoint  - per admitting -  Parkinson's, CAD with stent, CHF last EF45%, HTN, HLD, dysphagia, T2DM, chronic urinary retention with recurrent ESBL UTIs, BPH, GERD and depression  I reviewed ED provider notes, last 24 h vitals and pain scores, last 48 h intake and output, last 24 h labs and trends, and last 24 h imaging results.    Carlena Bjornstad,  PA-C [facility] 07/03/2023, 4:11 PM Please see Amion for pager number during day hours 7:00am-4:30pm

## 2023-07-03 NOTE — Consult Note (Signed)
Urology Consult   Physician requesting consult: Dr. Kirby Crigler  Reason for consult: Gross hematuria  History of Present Illness: [name] is a 77 y.o. who presented to the [facility] emergency department after a fall in the shower 48 hours ago.  He landed on his left flank directly.  He had increasing pain and therefore presented for evaluation today.  He was evaluated by trauma surgery and underwent a CT scan of the chest, abdomen, and pelvis.  He does have multiple left-sided rib fractures.  No renal laceration or perirenal hematoma was noted.  He did have some gross hematuria initially although this cleared rather quickly.  He is now voiding well and to completion with mildly tea-colored urine.  He is on both Plavix and aspirin for cardiac stent placement in March.   Past Medical History:  Diagnosis Date   Abnormal involuntary movement 02/18/2018   Allergic rhinitis    Arthritis    BPH (benign prostatic hyperplasia)    Diabetes (HCC)    Dysphagia, pharyngeal phase    GERD diagnosed on barium swallow. Has small hiatal hernia. Symptomatically somewhat better on omeprazole but not entirely. We'll try b.i.d. therapy   Edema    1+ in both ankles, likely multifactorial including medication such as Requip   Erectile dysfunction    Staxyn 10 mg or Viagra worked well. 3 samples of Cialis 20 mg provided   GERD (gastroesophageal reflux disease)    Hypercholesteremia    Hypertension    Nephrolithiasis    Onychomycosis of toenail    April 27, 2013 - Dr. Merwyn Katos - podiatry, was in [place] - treating with oral Lamisil and topical nail therapy   Parkinson's disease     followed by Dr. Raquel Sarna at [facility] and Lesia Sago, M.D. in [place]   Presbycusis    and tinnitus - Dr. Serena Colonel - August/2013   Renal calculus    Syncope     Past Surgical History:  Procedure Laterality Date   CORONARY STENT INTERVENTION N/A 01/11/2023   Procedure: CORONARY STENT INTERVENTION;  Surgeon:  Kathleene Hazel, MD;  Location: [facility];  Service: Cardiovascular;  Laterality: N/A;   HAND SURGERY     INGUINAL HERNIA REPAIR Left 11/04/2015   Procedure: LEFT INGUINAL HERNIA REPAIR WITH MESH;  Surgeon: Darnell Level, MD;  Location: [facility];  Service: General;  Laterality: Left;   INSERTION OF MESH Left 11/04/2015   Procedure: INSERTION OF MESH;  Surgeon: Darnell Level, MD;  Location: [facility];  Service: General;  Laterality: Left;   JOINT REPLACEMENT Bilateral    KNEE SURGERY     LEFT HEART CATH AND CORONARY ANGIOGRAPHY N/A 01/11/2023   Procedure: LEFT HEART CATH AND CORONARY ANGIOGRAPHY;  Surgeon: Kathleene Hazel, MD;  Location: [facility];  Service: Cardiovascular;  Laterality: N/A;   TOTAL KNEE ARTHROPLASTY      Current Hospital Medications:  Home Meds:  No current facility-administered medications on file prior to encounter.   Current Outpatient Medications on File Prior to Encounter  Medication Sig Dispense Refill   acetaminophen (TYLENOL) 500 MG tablet Take 500-1,000 mg by mouth every 6 (six) hours as needed (for pain).     ADVIL 200 MG CAPS Take 200-800 mg by mouth every 6 (six) hours as needed (for pain).     aspirin EC 81 MG tablet Take 81 mg by mouth daily. Swallow whole.     calcium carbonate (TUMS) 500 MG  chewable tablet Chew 2 tablets by mouth daily as needed for indigestion or heartburn.     carbidopa-levodopa (SINEMET IR) 25-100 MG tablet TAKE 3 TABLETS BY MOUTH AT 7:00 AM, 3 TABS AT 10:00 AM, 3 TABS AT 12:00 PM, 3 TABS AT 2:00 PM, 3 TABS AT 4:00, AND 3 TABS AT 6:00 PM 1620 tablet 0   Cholecalciferol (VITAMIN D) 50 MCG (2000 UT) tablet Take 2,000 Units by mouth daily.     clopidogrel (PLAVIX) 75 MG tablet Take 1 tablet (75 mg total) by mouth daily with breakfast. 90 tablet 2   furosemide (LASIX) 20 MG tablet Take 2 tablets (40 mg total) by mouth daily. (Patient taking differently: Take 20 mg by mouth 2 (two)  times daily.) 180 tablet 3   insulin lispro (HUMALOG) 100 UNIT/ML KwikPen Inject 2-15 Units into the skin 4 (four) times daily -  before meals and at bedtime. CBG 121 - 150: 2 units, CBG 151 - 200: 3 units, CBG 201 - 250: 5 units, CBG 251 - 300: 8 units, CBG 301 - 350: 11 units, CBG 351 - 400: 15 units (Patient taking differently: Inject 2-3 Units into the skin 3 (three) times daily before meals. CBG Sliding Scalenper) 15 mL 11   LANTUS SOLOSTAR 100 UNIT/ML Solostar Pen Inject 25 Units into the skin at bedtime. (Patient taking differently: Inject 10-12 Units into the skin at bedtime. 100-150 10 150 and over 12 per sliding scalr) 15 mL 11   metoprolol succinate (TOPROL-XL) 50 MG 24 hr tablet Take 25 mg by mouth daily. Take with or immediately following a meal.     Continuous Glucose Sensor (DEXCOM G7 SENSOR) MISC 1 each by Does not apply route as needed. 1 each 5   entacapone (COMTAN) 200 MG tablet TAKE 1 TABLET BY MOUTH 3 TIMES DAILY (WITH FIRST THREE DOSAGES OF CARBIDOPA/LEVODOPA). 270 tablet 0   ENTRESTO 24-26 MG TAKE 1 TABLET BY MOUTH 2 TIMES DAILY. 60 tablet 6   GLOBAL EASE INJECT PEN NEEDLES 31G X 5 MM MISC Inject into the skin.     methenamine (HIPREX) 1 g tablet TAKE 1 TABLET BY MOUTH 2 TIMES DAILY. TAKE WITH ANY VITAMIN C, TWICE A DAY FOR UTI PREVENTION (Patient taking differently: Take 0.5 g by mouth 4 (four) times daily.) 180 tablet 3   nitroGLYCERIN (NITROSTAT) 0.4 MG SL tablet Place 1 tablet (0.4 mg total) under the tongue every 5 (five) minutes as needed. 25 tablet 2   pantoprazole (PROTONIX) 40 MG tablet TAKE 1 TABLET (40 MG TOTAL) BY MOUTH DAILY 90 tablet 3   potassium chloride (KLOR-CON) 10 MEQ tablet Take 10 mEq by mouth 2 (two) times daily.     rOPINIRole (REQUIP) 3 MG tablet Take 1.5-3 mg by mouth at bedtime. Take 1 in the morning, Take 1 in afternoon and Take 1/2 tablet in the evening     rosuvastatin (CRESTOR) 40 MG tablet Take 1 tablet (40 mg total) by mouth daily. 90 tablet 2    sertraline (ZOLOFT) 50 MG tablet Take 50 mg by mouth daily.     tamsulosin (FLOMAX) 0.4 MG CAPS capsule Take 0.4 mg by mouth in the morning and at bedtime.  11   triamcinolone (NASACORT) 55 MCG/ACT AERO nasal inhaler Place 2 sprays into the nose daily as needed (for allergies or rhinitis).     vitamin B-12 (CYANOCOBALAMIN) 1000 MCG tablet Take 2,000 mcg by mouth daily.       Scheduled Meds:  acetaminophen  650 mg  Oral Q6H   aspirin EC  81 mg Oral Daily   [START ON 07/04/2023] clopidogrel  75 mg Oral Q breakfast   furosemide  20 mg Oral BID   [START ON 07/04/2023] insulin aspart  0-15 Units Subcutaneous TID WC   insulin aspart  0-5 Units Subcutaneous QHS   insulin glargine-yfgn  8 Units Subcutaneous QHS   lidocaine  1 patch Transdermal Q24H   methocarbamol  500 mg Oral Q6H   pantoprazole  40 mg Oral Daily   rosuvastatin  40 mg Oral Daily   sacubitril-valsartan  1 tablet Oral BID   sertraline  50 mg Oral Daily   tamsulosin  0.4 mg Oral Daily   Continuous Infusions: PRN Meds:.acetaminophen **OR** acetaminophen, albuterol, HYDROmorphone (DILAUDID) injection, ondansetron **OR** ondansetron (ZOFRAN) IV, traMADol, traZODone  Allergies:  Allergies  Allergen Reactions   Nucynta [Tapentadol] Other (See Comments)    Made the patient not feel well. "He did not feel well at all."   Oxycodone Itching   Sudafed [Pseudoephedrine Hcl] Other (See Comments)    Problems urinating    Tramadol Hcl Itching and Other (See Comments)    Can tolerate, however (in 2023)    Family History  Problem Relation Age of Onset   Atrial fibrillation Mother    Breast cancer Mother    Emphysema Father    Heart disease Father    Cancer Brother        African Burkitt    Social History:  reports that he has never smoked. He has never used smokeless tobacco. He reports that he does not drink alcohol and does not use drugs.  ROS: A complete review of systems was performed.  All systems are negative except for  pertinent findings as noted.  Physical Exam:  Vital signs in last 24 hours: Temp:  [98 F (36.7 C)-98.3 F (36.8 C)] 98 F (36.7 C) (09/18 1444) Pulse Rate:  [46-57] 50 (09/18 1545) Resp:  [16] 16 (09/18 1600) BP: (120-146)/(63-86) 135/66 (09/18 1545) SpO2:  [93 %-95 %] 95 % (09/18 1545) Weight:  [87.3 kg] 87.3 kg (09/18 1043) Constitutional:  Alert and oriented, No acute distress Cardiovascular: Regular rate and rhythm, No JVD Respiratory: Normal respiratory effort GI: Abdomen is soft, nontender, nondistended, no abdominal masses GU: Moderate left CVA tenderness.  No ecchymosis. Lymphatic: No lymphadenopathy Neurologic: Grossly intact, no focal deficits Psychiatric: Normal mood and affect  Laboratory Data:  Recent Labs    07/03/23 1126  WBC 7.1  HGB 14.3  HCT 45.5  PLT 244    Recent Labs    07/03/23 1126  NA 140  K 4.3  CL 104  GLUCOSE 163*  BUN 33*  CALCIUM 9.2  CREATININE 1.27*     Results for orders placed or performed during the hospital encounter of 07/03/23 (from the past 24 hour(s))  Comprehensive metabolic panel     Status: Abnormal   Collection Time: 07/03/23 11:26 AM  Result Value Ref Range   Sodium 140 135 - 145 mmol/L   Potassium 4.3 3.5 - 5.1 mmol/L   Chloride 104 98 - 111 mmol/L   CO2 25 22 - 32 mmol/L   Glucose, Bld 163 (H) 70 - 99 mg/dL   BUN 33 (H) 8 - 23 mg/dL   Creatinine, Ser 2.95 (H) 0.61 - 1.24 mg/dL   Calcium 9.2 8.9 - 28.4 mg/dL   Total Protein 6.5 6.5 - 8.1 g/dL   Albumin 3.7 3.5 - 5.0 g/dL   AST 11 (L) 15 -  41 U/L   ALT <5 0 - 44 U/L   Alkaline Phosphatase 79 38 - 126 U/L   Total Bilirubin 0.7 0.3 - 1.2 mg/dL   GFR, Estimated 59 (L) >60 mL/min   Anion gap 11 5 - 15  CBC with Differential     Status: None   Collection Time: 07/03/23 11:26 AM  Result Value Ref Range   WBC 7.1 4.0 - 10.5 K/uL   RBC 4.85 4.22 - 5.81 MIL/uL   Hemoglobin 14.3 13.0 - 17.0 g/dL   HCT 72.5 36.6 - 44.0 %   MCV 93.8 80.0 - 100.0 fL   MCH 29.5  26.0 - 34.0 pg   MCHC 31.4 30.0 - 36.0 g/dL   RDW 34.7 42.5 - 95.6 %   Platelets 244 150 - 400 K/uL   nRBC 0.0 0.0 - 0.2 %   Neutrophils Relative % 74 %   Neutro Abs 5.2 1.7 - 7.7 K/uL   Lymphocytes Relative 15 %   Lymphs Abs 1.1 0.7 - 4.0 K/uL   Monocytes Relative 9 %   Monocytes Absolute 0.6 0.1 - 1.0 K/uL   Eosinophils Relative 2 %   Eosinophils Absolute 0.1 0.0 - 0.5 K/uL   Basophils Relative 0 %   Basophils Absolute 0.0 0.0 - 0.1 K/uL   Immature Granulocytes 0 %   Abs Immature Granulocytes 0.02 0.00 - 0.07 K/uL  Urinalysis, Routine w reflex microscopic -Urine, Clean Catch     Status: Abnormal   Collection Time: 07/03/23 11:26 AM  Result Value Ref Range   Color, Urine YELLOW YELLOW   APPearance HAZY (A) CLEAR   Specific Gravity, Urine 1.023 1.005 - 1.030   pH 5.0 5.0 - 8.0   Glucose, UA NEGATIVE NEGATIVE mg/dL   Hgb urine dipstick LARGE (A) NEGATIVE   Bilirubin Urine NEGATIVE NEGATIVE   Ketones, ur 5 (A) NEGATIVE mg/dL   Protein, ur 387 (A) NEGATIVE mg/dL   Nitrite NEGATIVE NEGATIVE   Leukocytes,Ua NEGATIVE NEGATIVE   RBC / HPF >50 0 - 5 RBC/hpf   WBC, UA 6-10 0 - 5 WBC/hpf   Bacteria, UA RARE (A) NONE SEEN   Squamous Epithelial / HPF 0-5 0 - 5 /HPF   Mucus PRESENT    Recent Results (from the past 240 hour(s))  Urine Culture     Status: None   Collection Time: 06/23/23 10:10 PM   Specimen: Urine, Random  Result Value Ref Range Status   Specimen Description URINE, RANDOM  Final   Special Requests NONE Reflexed from [id number]  Final   Culture   Final    NO GROWTH Performed at [facility], [address] [place], [place] [address]    Report Status 06/25/2023 FINAL  Final  SARS Coronavirus 2 by RT PCR (hospital order, performed in [facility] hospital lab) *cepheid single result test* Anterior Nasal Swab     Status: None   Collection Time: 06/24/23  1:35 AM   Specimen: Anterior Nasal Swab  Result Value Ref Range Status   SARS Coronavirus 2 by RT PCR NEGATIVE  NEGATIVE Final    Comment: Performed at [facility], [address], [place], [place] [address]    Renal Function: Recent Labs    07/03/23 1126  CREATININE 1.27*   Estimated Creatinine Clearance: 53.2 mL/min (A) (by C-G formula based on SCr of 1.27 mg/dL (H)).  Radiologic Imaging: CT HEAD WO CONTRAST ( )  Result Date: 07/03/2023 CLINICAL DATA:  Minor head trauma EXAM:  CT HEAD WITHOUT CONTRAST TECHNIQUE: Contiguous axial images were obtained from the base of the skull through the vertex without intravenous contrast. RADIATION DOSE REDUCTION: This exam was performed according to the departmental dose-optimization program which includes automated exposure control, adjustment of the mA and/or kV according to patient size and/or use of iterative reconstruction technique. COMPARISON:  06/23/2023 FINDINGS: Brain: No acute infarct or hemorrhage. Lateral ventricles and midline structures are stable. No acute extra-axial fluid collections. No mass effect. Vascular: No hyperdense vessel or unexpected calcification. Skull: Normal. Negative for fracture or focal lesion. Sinuses/Orbits: No acute finding. Other: None. IMPRESSION: 1. No acute intracranial process. Electronically Signed   By: Sharlet Salina M.D.   On: 07/03/2023 15:28   CT CHEST ABDOMEN PELVIS W CONTRAST  Result Date: 07/03/2023 CLINICAL DATA:  Larey Seat, left-sided pain, gross hematuria EXAM: CT CHEST, ABDOMEN, AND PELVIS WITH CONTRAST TECHNIQUE: Multidetector CT imaging of the chest, abdomen and pelvis was performed following the standard protocol during bolus administration of intravenous contrast. RADIATION DOSE REDUCTION: This exam was performed according to the departmental dose-optimization program which includes automated exposure control, adjustment of the mA and/or kV according to patient size and/or use of iterative reconstruction technique. CONTRAST:  OMNIPAQUE IOHEXOL 300 MG/ML  SOLN COMPARISON:  06/14/2022 and previous  FINDINGS: CT CHEST FINDINGS Cardiovascular: SVC patent. Heart size upper limits normal. No pericardial effusion. 3-vessel coronary calcifications. Central great vessels unremarkable. Mediastinum/Nodes: No mediastinal hematoma, mass, or adenopathy. Lungs/Pleura: No pleural effusion. No pneumothorax. Dependent atelectasis/infiltrate in both lung bases, left greater than right. Musculoskeletal: Minimally displaced fracture, anterolateral aspect left sixth rib. Healing fractures of left seventh and eighth ribs laterally. CT ABDOMEN PELVIS FINDINGS Hepatobiliary: 2 cm lamellated gallstone near the gallbladder neck. Gallbladder is nondilated. No focal liver lesion or biliary ductal dilatation. Pancreas: Unremarkable. No pancreatic ductal dilatation or surrounding inflammatory changes. Spleen: Normal in size without focal abnormality. Adrenals/Urinary Tract: No adrenal mass. Scattered areas of renal parenchymal loss. 4 mm calculus peripherally mid left kidney. No hydronephrosis. Urinary bladder physiologically distended. Stomach/Bowel: Stomach and small bowel decompressed. Appendix not identified. The colon is partially distended, without acute finding. Vascular/Lymphatic: Mild scattered aortoiliac calcified plaque. No AAA. Portal vein patent. No abdominal or pelvic adenopathy. Reproductive: Mild prostate enlargement. Other: Bilateral pelvic phleboliths.  No ascites.  No free air. Musculoskeletal: Chronic L1 compression fracture deformity. Stable grade 1 anterolisthesis L4-5. IMPRESSION: 1. Minimally displaced left sixth rib fracture, without pneumothorax or effusion. 2. Healing left seventh and eighth rib fractures. 3. Cholelithiasis. 4. Left nephrolithiasis without hydronephrosis. 5. Chronic L1 compression fracture deformity. 6. Coronary and aortic Atherosclerosis (ICD10-I70.0). Electronically Signed   By: Corlis Leak M.D.   On: 07/03/2023 15:24    I independently reviewed the above imaging  studies.  Impression/Recommendation 1.  Hematuria: This would appear to definitively be related to his mechanism of injury which likely resulted in a direct blow to his left kidney.  This is already clearing.  I would recommend bedrest and serial monitoring of his hemoglobin/hematocrit.  If stable in the morning and his urine has cleared, he can resume ambulation.  I do not see a contraindication for him to continue dual antiplatelet therapy considering his recent cardiac stent in March.  Please call if further questions.  He does not require further urologic follow-up considering he has no evidence of renal injury.  Crecencio Mc 07/03/2023, 6:24 PM    Moody Bruins MD   CC: Dr. Kirby Crigler

## 2023-07-03 NOTE — ED Provider Notes (Signed)
  Physical Exam  BP 135/66   Pulse (!) 50   Temp 98 F (36.7 C) (Oral)   Resp 16   Ht 5\' 8"  (1.727 m)   Wt 87.3 kg   SpO2 95%   BMI 29.27 kg/m   Physical Exam  Procedures  Procedures  ED Course / MDM    Medical Decision Making Care assumed at 3 PM.  Patient is here after a fall.  Patient has gross hematuria.  Signout pending trauma scan  4:34 PM Trauma scan showed sixth rib fracture with no hemothorax or pneumothorax.  Patient has Parkinson's at baseline and has trouble walking.  Patient is unable to get up due to the pain.  Patient was given pain medicine and is still in a lot of pain.  I discussed case with surgery.  The surgical PA, Nehemiah Settle, saw the patient and discussed case with Dr. Freida Busman from surgery.  She recommended medical admission for pain control and surgery will consult.  She states that since patient has only 1 rib fracture, she can stay at [facility] long.  Problems Addressed: Closed fracture of one rib of left side, initial encounter: acute illness or injury Gross hematuria: acute illness or injury  Amount and/or Complexity of Data Reviewed Labs: ordered. Decision-making details documented in ED Course. Radiology: ordered and independent interpretation performed. Decision-making details documented in ED Course.  Risk Prescription drug management.          Charlynne Pander, MD 07/03/23 [phone]

## 2023-07-03 NOTE — ED Triage Notes (Signed)
Pt fell 48 hours ago in shower hitting left post flank pain with hematuria and clots since fall.

## 2023-07-04 ENCOUNTER — Ambulatory Visit: Payer: Medicare Other | Admitting: Infectious Diseases

## 2023-07-04 DIAGNOSIS — R531 Weakness: Secondary | ICD-10-CM | POA: Diagnosis not present

## 2023-07-04 DIAGNOSIS — S2232XA Fracture of one rib, left side, initial encounter for closed fracture: Secondary | ICD-10-CM

## 2023-07-04 DIAGNOSIS — R310 Gross hematuria: Secondary | ICD-10-CM | POA: Diagnosis not present

## 2023-07-04 DIAGNOSIS — W19XXXA Unspecified fall, initial encounter: Secondary | ICD-10-CM

## 2023-07-04 LAB — GLUCOSE, CAPILLARY
Glucose-Capillary: 155 mg/dL — ABNORMAL HIGH (ref 70–99)
Glucose-Capillary: 130 mg/dL — ABNORMAL HIGH (ref 70–99)
Glucose-Capillary: 164 mg/dL — ABNORMAL HIGH (ref 70–99)
Glucose-Capillary: 297 mg/dL — ABNORMAL HIGH (ref 70–99)

## 2023-07-04 LAB — CBC
HCT: 42.7 % (ref 39.0–52.0)
Hemoglobin: 13.4 g/dL (ref 13.0–17.0)
MCH: 29.5 pg (ref 26.0–34.0)
MCHC: 31.4 g/dL (ref 30.0–36.0)
MCV: 93.8 fL (ref 80.0–100.0)
Platelets: 212 10*3/uL (ref 150–400)
RBC: 4.55 MIL/uL (ref 4.22–5.81)
RDW: 13.1 % (ref 11.5–15.5)
WBC: 5.1 10*3/uL (ref 4.0–10.5)
nRBC: 0 % (ref 0.0–0.2)

## 2023-07-04 LAB — BASIC METABOLIC PANEL
Anion gap: 6 (ref 5–15)
BUN: 25 mg/dL — ABNORMAL HIGH (ref 8–23)
CO2: 29 mmol/L (ref 22–32)
Calcium: 8.7 mg/dL — ABNORMAL LOW (ref 8.9–10.3)
Chloride: 103 mmol/L (ref 98–111)
Creatinine, Ser: 1.35 mg/dL — ABNORMAL HIGH (ref 0.61–1.24)
GFR, Estimated: 54 mL/min — ABNORMAL LOW (ref >60)
Glucose, Bld: 185 mg/dL — ABNORMAL HIGH (ref 70–99)
Potassium: 4.4 mmol/L (ref 3.5–5.1)
Sodium: 138 mmol/L (ref 135–145)

## 2023-07-04 MED ORDER — CARBIDOPA-LEVODOPA 25-100 MG PO TABS
3.0000 | ORAL_TABLET | ORAL | Status: DC
  Administered 2023-07-04 – 2023-07-08 (×23): 3 via ORAL
  Filled 2023-07-04 (×29): qty 3

## 2023-07-04 MED ORDER — BISACODYL 10 MG RE SUPP
10.0000 mg | Freq: Every day | RECTAL | Status: AC | PRN
  Administered 2023-07-04 – 2023-07-07 (×2): 10 mg via RECTAL
  Filled 2023-07-04 (×2): qty 1

## 2023-07-04 MED ORDER — METOPROLOL SUCCINATE ER 25 MG PO TB24
25.0000 mg | ORAL_TABLET | Freq: Every day | ORAL | Status: DC
  Administered 2023-07-06: 25 mg via ORAL
  Filled 2023-07-04 (×2): qty 1

## 2023-07-04 NOTE — [place] FL2 (Signed)
[place] MEDICAID FL2 LEVEL OF CARE FORM     IDENTIFICATION  Patient [name] Birthdate: 1946/10/15 Sex: male Admission Date (Current Location): 07/03/2023  [facility] and [place] Number:  Producer, television/film/video and Address:  [facility],  [address] [place], [place] [address]      Provider Number: [id number]  Attending Physician Name and Address:  Merlene Laughter, DO  Relative Name and Phone Number:  Keishun Raper [phone]    Current Level of Care: Hospital Recommended Level of Care: Skilled Nursing Facility Prior Approval Number:    Date Approved/Denied:   PASRR Number: [id number]  Discharge Plan: SNF    Current Diagnoses: Patient Active Problem List   Diagnosis Date Noted   Gross hematuria 07/03/2023   Depression 06/24/2023   Chemosis 06/24/2023   Elevated troponin 04/03/2023   Sepsis (HCC) 04/01/2023   Coronary artery disease involving native coronary 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 angina pectoris 01/11/2023   Chronic combined systolic and diastolic heart failure (HCC)    Dysphagia 04/29/2022   AKI (acute kidney injury) (HCC)    Dehydration    Failure to thrive (child)    Bacteriuria, asymptomatic    Failure to thrive in adult 04/20/2022   Malaise, possible ESBL Klebsiella UTI 04/20/2022   Chronic retention of urine 04/20/2022   Closed lumbar vertebral fracture (HCC) 04/20/2022   UTI (urinary tract infection) 03/01/2022   Leukocytosis 01/01/2022   Hyponatremia 01/01/2022   CKD (chronic kidney disease), stage IIIa (HCC) 01/01/2022   HTN (hypertension) 12/31/2021   History of ESBL Klebsiella pneumoniae infection 12/31/2021   Generalized weakness 12/31/2021   Chronic venous insufficiency 08/29/2021   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   Encounter for general adult medical examination with abnormal findings 02/18/2018   Enlarged prostate 02/18/2018   Hearing loss 02/18/2018   Other long term (current) drug therapy  02/18/2018   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) 02/18/2018   HLD (hyperlipidemia)    Nephrolithiasis    Parkinson disease (HCC)    Diabetes mellitus with coincident hypertension (HCC)    Syncope    Renal calculus    Diabetes (HCC)    Allergic rhinitis    BPH (benign prostatic hyperplasia)    Erectile dysfunction    Dysphagia, pharyngeal phase    Edema    Ureteral calculus 02/09/2013   LEG PAIN 03/14/2010    Orientation RESPIRATION BLADDER Height & Weight     Self, Time, Situation, Place  Normal Continent Weight: 192 lb 8 oz (87.3 kg) Height:  5\' 8"  (172.7 cm)  BEHAVIORAL SYMPTOMS/MOOD NEUROLOGICAL BOWEL NUTRITION STATUS      Continent Diet (Carb modified)  AMBULATORY STATUS COMMUNICATION OF NEEDS Skin   Limited Assist Verbally Normal                       Personal Care Assistance Level of Assistance  Bathing, Feeding, Dressing Bathing Assistance: Limited assistance Feeding assistance: Independent Dressing Assistance: Limited assistance     Functional Limitations Info  Sight, Hearing, Speech Sight Info: Impaired Hearing Info: Impaired Speech Info: Adequate    SPECIAL CARE FACTORS FREQUENCY  PT (By licensed PT), OT (By licensed OT)     PT Frequency: 5x/wk OT Frequency: 5x/wk            Contractures Contractures Info: Not present    Additional Factors Info  Code Status, Allergies Code Status Info: DNR Allergies Info: Nucynta (Tapentadol), Oxycodone, Sudafed (Pseudoephedrine Hcl), Tramadol  Hcl           Current Medications (07/04/2023):  This is the current hospital active medication list Current Facility-Administered Medications  Medication Dose Route Frequency Provider Last Rate Last Admin   acetaminophen (TYLENOL) tablet 650 mg  650 mg Oral Q6H PRN Kirby Crigler, Mir M, MD       Or   acetaminophen (TYLENOL) suppository 650 mg  650 mg Rectal Q6H PRN Kirby Crigler, Mir M, MD       acetaminophen (TYLENOL) tablet 650 mg  650 mg Oral Q6H  Meuth, Brooke A, PA-C   650 mg at 07/04/23 1341   albuterol (PROVENTIL) (2.5 MG/3ML) 0.083% nebulizer solution 2.5 mg  2.5 mg Nebulization Q2H PRN Kirby Crigler, Mir M, MD       aspirin EC tablet 81 mg  81 mg Oral Daily Kirby Crigler, Mir M, MD   81 mg at 07/04/23 0929   carbidopa-levodopa (SINEMET IR) 25-100 MG per tablet immediate release 3 tablet  3 tablet Oral 6 times per day Marguerita Merles Alligator, DO   3 tablet at 07/04/23 1342   clopidogrel (PLAVIX) tablet 75 mg  75 mg Oral Q breakfast Kirby Crigler, Mir M, MD   75 mg at 07/04/23 0929   furosemide (LASIX) tablet 20 mg  20 mg Oral BID Kirby Crigler, Mir M, MD   20 mg at 07/04/23 0929   HYDROmorphone (DILAUDID) injection 1 mg  1 mg Intravenous Q4H PRN Kirby Crigler, Mir M, MD   1 mg at 07/04/23 1034   insulin aspart (novoLOG) injection 0-15 Units  0-15 Units Subcutaneous TID WC Kirby Crigler, Mir M, MD   2 Units at 07/04/23 1300   insulin aspart (novoLOG) injection 0-5 Units  0-5 Units Subcutaneous QHS Kirby Crigler, Mir M, MD       insulin glargine-yfgn [facility]) injection 8 Units  8 Units Subcutaneous QHS Kirby Crigler, Mir M, MD   8 Units at 07/03/23 2211   lidocaine (LIDODERM) 5 % 1 patch  1 patch Transdermal Q24H Meuth, Brooke A, PA-C   1 patch at 07/03/23 2044   methocarbamol (ROBAXIN) tablet 500 mg  500 mg Oral Q6H Meuth, Brooke A, PA-C   500 mg at 07/04/23 1344   ondansetron (ZOFRAN) tablet 4 mg  4 mg Oral Q6H PRN Kirby Crigler, Mir M, MD       Or   ondansetron [facility]) injection 4 mg  4 mg Intravenous Q6H PRN Kirby Crigler, Mir M, MD       pantoprazole (PROTONIX) EC tablet 40 mg  40 mg Oral Daily Kirby Crigler, Mir M, MD   40 mg at 07/04/23 0929   rosuvastatin (CRESTOR) tablet 40 mg  40 mg Oral Daily Kirby Crigler, Mir M, MD   40 mg at 07/04/23 4098   sacubitril-valsartan (ENTRESTO) 24-26 mg per tablet  1 tablet Oral BID Maryln Gottron, MD   1 tablet at 07/04/23 0929   sertraline (ZOLOFT) tablet 50 mg  50 mg Oral Daily Kirby Crigler, Mir M, MD   50 mg at 07/04/23 1191    tamsulosin (FLOMAX) capsule 0.4 mg  0.4 mg Oral Daily Kirby Crigler, Mir M, MD   0.4 mg at 07/04/23 4782   traMADol (ULTRAM) tablet 50 mg  50 mg Oral Q6H PRN Meuth, Brooke A, PA-C       traZODone (DESYREL) tablet 25 mg  25 mg Oral QHS PRN Maryln Gottron, MD         Discharge Medications: Please see discharge summary for a list of discharge medications.  Relevant Imaging Results:  Relevant  Lab Results:   Additional Information SSN# [id number] [place], LCSW

## 2023-07-04 NOTE — Plan of Care (Signed)
Problem: Education: Goal: Knowledge of General Education information will improve Description: Including pain rating scale, medication(s)/side effects and non-pharmacologic comfort measures Outcome: Progressing   Problem: Clinical Measurements: Goal: Ability to maintain clinical measurements within normal limits will improve Outcome: Progressing   Problem: Pain Managment: Goal: General experience of comfort will improve Outcome: Progressing   Problem: Health Behavior/Discharge Planning: Goal: Ability to manage health-related needs will improve Outcome: Not Progressing   Problem: Activity: Goal: Risk for activity intolerance will decrease Outcome: Not Progressing

## 2023-07-04 NOTE — Evaluation (Signed)
Occupational Therapy Evaluation Patient Details [name] MRN: [id number] DOB: 1946-04-16 Today's Date: 07/04/2023   History of Present Illness 77 y.o. male with medical history significant for heart failure with reduced EF, recent hospital stay 9/8-9/10/24 for suspected ESBL UTI, CAD status post coronary stent March 2024 on aspirin and Plavix, insulin-dependent type 2 diabetes, hypertension, Parkinson's disease, hyperlipidemia, GERD being admitted to the hospital with left-sided acute L 6th rib fracture and gross hematuria after a mechanical fall in the shower. Dx of acute renal failure.   Clinical Impression   Pt admitted with the above diagnosis. Pt currently with functional limitations due to the deficits listed below (see OT Problem List). Prior to admit, pt was living at home with wife completing BADL tasks with Min A-Mod I level. Wife reports that things have been getting progressively more difficult since June 2024. Pt has experienced increased falls in the past 2 weeks alone. Patient will benefit from continued inpatient follow up therapy, <3 hours/day.  Pt will benefit from acute skilled OT to increase their safety and independence with ADL and functional mobility for ADL to facilitate discharge. OT will continue to follow patient acutely.         If plan is discharge home, recommend the following: A lot of help with walking and/or transfers;A lot of help with bathing/dressing/bathroom;Assist for transportation    Functional Status Assessment  Patient has had a recent decline in their functional status and demonstrates the ability to make significant improvements in function in a reasonable and predictable amount of time.  Equipment Recommendations  None recommended by OT       Precautions / Restrictions Precautions Precautions: Fall Precaution Comments: parkinsons Restrictions Weight Bearing Restrictions: No      Mobility Bed Mobility Overal bed mobility: Needs  Assistance Bed Mobility: Supine to Sit     Supine to sit: Mod assist, HOB elevated, Used rails     General bed mobility comments: pt used gait belt looped on footboard to pull up; assist needed to pivot hips and fully raise trunk upright    Transfers Overall transfer level: Needs assistance Equipment used: Rolling walker (2 wheels) Transfers: Sit to/from Stand, Bed to chair/wheelchair/BSC Sit to Stand: From elevated surface, Min assist     Step pivot transfers: Contact guard assist     General transfer comment: increased time for all movement; R lateral lean in standing with RW; pt took several pivotal steps to recliner with RW with increased time, VCs for hand placement with sit to stand. VC provided for sequencing and correcting body positioning while standing within RW.      Balance Overall balance assessment: History of Falls, Needs assistance Sitting-balance support: Feet supported, No upper extremity supported, Single extremity supported Sitting balance-Leahy Scale: Poor Sitting balance - Comments: sitting EOB Postural control: Right lateral lean Standing balance support: Bilateral upper extremity supported, During functional activity, Reliant on assistive device for balance Standing balance-Leahy Scale: Poor              ADL either performed or assessed with clinical judgement   ADL         General ADL Comments: At this time, pt is requiring Min-mod A for BADL tasks.     Vision Baseline Vision/History: 1 Wears glasses Ability to See in Adequate Light: 1 Impaired Patient Visual Report: Other (comment) (recent left eye bleed) Additional Comments: glasses for all the time.     Perception Perception: Not tested  Praxis Praxis: Not tested       Pertinent Vitals/Pain Pain Assessment Pain Assessment: 0-10 Pain Score: 4  Pain Location: L chest/rib region Pain Descriptors / Indicators: Throbbing, Discomfort Pain Intervention(s): Limited activity  within patient's tolerance, Monitored during session, Premedicated before session, Repositioned     Extremity/Trunk Assessment Upper Extremity Assessment Upper Extremity Assessment: Generalized weakness   Lower Extremity Assessment Lower Extremity Assessment: Generalized weakness;RLE deficits/detail;LLE deficits/detail RLE Deficits / Details: B knee ext +4/5 RLE Sensation: WNL LLE Deficits / Details: B knee ext +4/5 LLE Sensation: WNL   Cervical / Trunk Assessment Cervical / Trunk Assessment: Kyphotic   Communication Communication Communication: No apparent difficulties   Cognition Arousal: Alert Behavior During Therapy: WFL for tasks assessed/performed Overall Cognitive Status: Within Functional Limits for tasks assessed                  General Comments  Left eye red with bruising present underneath.            Home Living Family/patient expects to be discharged to:: Private residence Living Arrangements: Spouse/significant other   Type of Home: House Home Access: Stairs to enter [employer] of Steps: 5 steps   Home Layout: Other (Comment) (pt lives in basement. 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      Prior Functioning/Environment Prior Level of Function : Independent/Modified Independent;History of Falls (last six months)             Mobility Comments: several falls in the past 2 weeks; uses RW vs SPC ADLs Comments: wife assists some with compression stockings        OT Problem List: Decreased strength;Decreased coordination;Pain;Decreased activity tolerance;Impaired balance (sitting and/or standing)      OT Treatment/Interventions: Self-care/ADL training;Therapeutic  exercise;Therapeutic activities;Neuromuscular education;Energy conservation;DME and/or AE instruction;Patient/family education;Balance training;Manual therapy    OT Goals(Current goals can be found in the care plan section) Acute Rehab OT Goals Patient Stated Goal: to get stronger OT Goal Formulation: Patient unable to participate in goal setting Time For Goal Achievement: 07/18/23 Potential to Achieve Goals: Fair  OT Frequency: Min 1X/week    Co-evaluation PT/OT/SLP Co-Evaluation/Treatment: Yes Reason for Co-Treatment: For patient/therapist safety;To address functional/ADL transfers PT goals addressed during session: Mobility/safety with mobility;Balance;Proper use of DME OT goals addressed during session: ADL's and self-care;Proper use of Adaptive equipment and DME      AM-PAC OT "6 Clicks" Daily Activity     Outcome Measure Help from another person eating meals?: A Little Help from another person taking care of personal grooming?: A Little Help from another person toileting, which includes using toliet, bedpan, or urinal?: Total Help from another person bathing (including washing, rinsing, drying)?: A Lot Help from another person to put on and taking off regular upper body clothing?: A Lot Help from another person to put on and taking off regular lower body clothing?: Total 6 Click Score: 12   End of Session Equipment Utilized During Treatment: Gait belt;Rolling walker (2 wheels)  Activity Tolerance: Patient tolerated treatment well Patient left: in chair;with call bell/phone within reach;with chair alarm set;with family/visitor present;with nursing/sitter in room  OT Visit Diagnosis: Unsteadiness on feet (R26.81);Repeated falls (R29.6);Muscle weakness (generalized) (M62.81);History of falling (Z91.81);Other symptoms and signs involving the nervous system (R29.898);Pain Pain -  Right/Left: Left Pain - part of body:  (ribs)                Time: 3016-0109 OT Time Calculation  (min): 33 min Charges:  OT General Charges $OT Visit: 1 Visit OT Evaluation $OT Eval Moderate Complexity: 1 Mod  [employer], OTR/L,CBIS  Supplemental OT - [facility] and [facility] Secure Chat Preferred    Saladin Petrelli, Charisse March 07/04/2023, 2:59 PM

## 2023-07-04 NOTE — Evaluation (Signed)
Physical Therapy Evaluation Patient Details [name] MRN: [id number] DOB: 12-07-1945 Today's Date: 07/04/2023  History of Present Illness  77 y.o. male with medical history significant for heart failure with reduced EF, recent hospital stay 9/8-9/10/24 for suspected ESBL UTI, CAD status post coronary stent March 2024 on aspirin and Plavix, insulin-dependent type 2 diabetes, hypertension, Parkinson's disease, hyperlipidemia, GERD being admitted to the hospital with left-sided acute L 6th rib fracture and gross hematuria after a mechanical fall in the shower. Dx of acute renal failure.  Clinical Impression  Pt admitted with above diagnosis. Pt required mod assist for supine to sit. Min assist for sit to stand from elevated bed. Pt was able to take several pivotal steps from bed to recliner with increased time. All movement is slow and limited by L sided rib fx pain. Pt/wife are concerned about managing at home. Patient will benefit from continued inpatient follow up therapy, <3 hours/day.  Pt currently with functional limitations due to the deficits listed below (see PT Problem List). Pt will benefit from acute skilled PT to increase their independence and safety with mobility to allow discharge.           If plan is discharge home, recommend the following: A lot of help with walking and/or transfers;A lot of help with bathing/dressing/bathroom;Assistance with cooking/housework;Assist for transportation;Help with stairs or ramp for entrance   Can travel by private vehicle   No    Equipment Recommendations None recommended by PT  Recommendations for Other Services       Functional Status Assessment Patient has had a recent decline in their functional status and demonstrates the ability to make significant improvements in function in a reasonable and predictable amount of time.     Precautions / Restrictions Precautions Precautions: Fall Restrictions Weight Bearing Restrictions: No       Mobility  Bed Mobility Overal bed mobility: Needs Assistance Bed Mobility: Supine to Sit     Supine to sit: Mod assist, HOB elevated, Used rails     General bed mobility comments: pt used gait belt looped on footboard to pull up; assist needed to pivot hips and fully raise trunk upright    Transfers Overall transfer level: Needs assistance Equipment used: Rolling walker (2 wheels) Transfers: Sit to/from Stand, Bed to chair/wheelchair/BSC Sit to Stand: From elevated surface, Min assist   Step pivot transfers: Contact guard assist       General transfer comment: increased time for all movement; R lateral lean in standing with RW; pt took several pivotal steps to recliner with RW with increased time, VCs for hand placement with sit to stand    Ambulation/Gait                  Stairs            Wheelchair Mobility     Tilt Bed    Modified Rankin (Stroke Patients Only)       Balance Overall balance assessment: Needs assistance, History of Falls Sitting-balance support: Feet supported, No upper extremity supported Sitting balance-Leahy Scale: Fair   Postural control: Right lateral lean Standing balance support: Bilateral upper extremity supported, During functional activity, Reliant on assistive device for balance Standing balance-Leahy Scale: Poor                               Pertinent Vitals/Pain Pain Assessment Pain Assessment: 0-10 Pain Score: 4  Pain Location: L chest  Pain Descriptors / Indicators: Throbbing Pain Intervention(s): Limited activity within patient's tolerance, Monitored during session, Premedicated before session    Home Living Family/patient expects to be discharged to:: Private residence Living Arrangements: Spouse/significant other   Type of Home: House Home Access: Stairs to enter   [employer] of Steps: 5 steps   Home Layout: Other (Comment) (pt lives in basement. 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    Prior Function Prior Level of Function : Independent/Modified Independent;History of Falls (last six months)             Mobility Comments: several falls in the past 2 weeks; uses RW vs SPC ADLs Comments: wife assists some with compression stockings     Extremity/Trunk Assessment   Upper Extremity Assessment Upper Extremity Assessment: Defer to OT evaluation    Lower Extremity Assessment Lower Extremity Assessment: Generalized weakness;RLE deficits/detail;LLE deficits/detail RLE Deficits / Details: B knee ext +4/5 RLE Sensation: WNL LLE Deficits / Details: B knee ext +4/5 LLE Sensation: WNL    Cervical / Trunk Assessment Cervical / Trunk Assessment: Kyphotic  Communication   Communication Communication: No apparent difficulties  Cognition Arousal: Alert Behavior During Therapy: WFL for tasks assessed/performed Overall Cognitive Status: Within Functional Limits for tasks assessed                                          General Comments      Exercises     Assessment/Plan    PT Assessment Patient needs continued PT services  PT Problem List Decreased activity tolerance;Decreased strength;Decreased mobility;Pain;Decreased balance       PT Treatment Interventions DME instruction;Gait training;Functional mobility training;Therapeutic activities;Therapeutic exercise;Patient/family education    PT Goals (Current goals can be found in the Care Plan section)  Acute Rehab PT Goals Patient Stated Goal: to get stronger PT Goal Formulation: With patient/family Time For Goal Achievement: 07/18/23 Potential to Achieve Goals: Good    Frequency Min 1X/week     Co-evaluation PT/OT/SLP Co-Evaluation/Treatment: Yes Reason for Co-Treatment: For  patient/therapist safety;To address functional/ADL transfers PT goals addressed during session: Mobility/safety with mobility;Balance;Proper use of DME         AM-PAC PT "6 Clicks" Mobility  Outcome Measure Help needed turning from your back to your side while in a flat bed without using bedrails?: A Little Help needed moving from lying on your back to sitting on the side of a flat bed without using bedrails?: A Lot Help needed moving to and from a bed to a chair (including a wheelchair)?: A Little Help needed standing up from a chair using your arms (e.g., wheelchair or bedside chair)?: A Lot Help needed to walk in hospital room?: A Little Help needed climbing 3-5 steps with a railing? : A Lot 6 Click Score: 15    End of Session   Activity Tolerance: Patient limited by pain Patient left: in chair;with call bell/phone within reach;with chair alarm set;with nursing/sitter in room;with family/visitor present Nurse Communication: Mobility status PT Visit Diagnosis: Difficulty in walking, not elsewhere classified (R26.2);History of falling (Z91.81);Other abnormalities of gait and mobility (R26.89);Unsteadiness on feet (R26.81)    Time: 5284-1324 PT Time Calculation (min) (ACUTE ONLY): 37 min   Charges:   PT Evaluation $PT Eval Moderate Complexity: 1 Mod  PT General Charges $$ ACUTE PT VISIT: 1 Visit         Tamala Ser PT 07/04/2023  Acute Rehabilitation Services  Office [phone]

## 2023-07-04 NOTE — Progress Notes (Signed)
Subjective: Pain controlled, breathing well  ROS: See above, otherwise other systems negative  Objective: Vital signs in last 24 hours: Temp:  [97.7 F (36.5 C)-98.4 F (36.9 C)] 98.2 F (36.8 C) (09/19 0609) Pulse Rate:  [46-57] 48 (09/19 0609) Resp:  [16-18] 18 (09/19 0609) BP: (120-146)/(59-86) 138/66 (09/19 0609) SpO2:  [93 %-96 %] 96 % (09/19 0609) Weight:  [87.3 kg] 87.3 kg (09/18 1043) Last BM Date : 07/02/23  Intake/Output from previous day: 09/18 0701 - 09/19 0700 In: 240 [P.O.:240] Out: 850 [Urine:850] Intake/Output this shift: No intake/output data recorded.  PE: Gen: NAD Resp: nonlabored CV: bradycardic GU: urine is clear yellow  Lab Results:  Recent Labs    07/03/23 1126 07/04/23 0612  WBC 7.1 5.1  HGB 14.3 13.4  HCT 45.5 42.7  PLT 244 212   BMET Recent Labs    07/03/23 1126 07/04/23 0612  NA 140 138  K 4.3 4.4  CL 104 103  CO2 25 29  GLUCOSE 163* 185*  BUN 33* 25*  CREATININE 1.27* 1.35*  CALCIUM 9.2 8.7*   PT/INR No results for input(s): "LABPROT", "INR" in the last 72 hours. CMP     Component Value Date/Time   NA 138 07/04/2023 0612   NA 136 01/02/2023 1442   K 4.4 07/04/2023 0612   CL 103 07/04/2023 0612   CO2 29 07/04/2023 0612   GLUCOSE 185 (H) 07/04/2023 0612   BUN 25 (H) 07/04/2023 0612   BUN 22 01/02/2023 1442   CREATININE 1.35 (H) 07/04/2023 0612   CREATININE 1.29 (H) 08/21/2022 1558   CALCIUM 8.7 (L) 07/04/2023 0612   PROT 6.5 07/03/2023 1126   ALBUMIN 3.7 07/03/2023 1126   AST 11 (L) 07/03/2023 1126   ALT <5 07/03/2023 1126   ALKPHOS 79 07/03/2023 1126   BILITOT 0.7 07/03/2023 1126   GFRNONAA 54 (L) 07/04/2023 0612   GFRAA >60 11/01/2015 1230   Lipase  No results found for: "LIPASE"  Studies/Results: CT HEAD WO CONTRAST ( )  Result Date: 07/03/2023 CLINICAL DATA:  Minor head trauma EXAM: CT HEAD WITHOUT CONTRAST TECHNIQUE: Contiguous axial images were obtained from the base of the skull through  the vertex without intravenous contrast. RADIATION DOSE REDUCTION: This exam was performed according to the departmental dose-optimization program which includes automated exposure control, adjustment of the mA and/or kV according to patient size and/or use of iterative reconstruction technique. COMPARISON:  06/23/2023 FINDINGS: Brain: No acute infarct or hemorrhage. Lateral ventricles and midline structures are stable. No acute extra-axial fluid collections. No mass effect. Vascular: No hyperdense vessel or unexpected calcification. Skull: Normal. Negative for fracture or focal lesion. Sinuses/Orbits: No acute finding. Other: None. IMPRESSION: 1. No acute intracranial process. Electronically Signed   By: Sharlet Salina M.D.   On: 07/03/2023 15:28   CT CHEST ABDOMEN PELVIS W CONTRAST  Result Date: 07/03/2023 CLINICAL DATA:  Larey Seat, left-sided pain, gross hematuria EXAM: CT CHEST, ABDOMEN, AND PELVIS WITH CONTRAST TECHNIQUE: Multidetector CT imaging of the chest, abdomen and pelvis was performed following the standard protocol during bolus administration of intravenous contrast. RADIATION DOSE REDUCTION: This exam was performed according to the departmental dose-optimization program which includes automated exposure control, adjustment of the mA and/or kV according to patient size and/or use of iterative reconstruction technique. CONTRAST:  OMNIPAQUE IOHEXOL 300 MG/ML  SOLN COMPARISON:  06/14/2022 and previous FINDINGS: CT CHEST FINDINGS Cardiovascular: SVC patent. Heart size upper limits normal. No pericardial effusion. 3-vessel coronary calcifications. Central  great vessels unremarkable. Mediastinum/Nodes: No mediastinal hematoma, mass, or adenopathy. Lungs/Pleura: No pleural effusion. No pneumothorax. Dependent atelectasis/infiltrate in both lung bases, left greater than right. Musculoskeletal: Minimally displaced fracture, anterolateral aspect left sixth rib. Healing fractures of left seventh and eighth  ribs laterally. CT ABDOMEN PELVIS FINDINGS Hepatobiliary: 2 cm lamellated gallstone near the gallbladder neck. Gallbladder is nondilated. No focal liver lesion or biliary ductal dilatation. Pancreas: Unremarkable. No pancreatic ductal dilatation or surrounding inflammatory changes. Spleen: Normal in size without focal abnormality. Adrenals/Urinary Tract: No adrenal mass. Scattered areas of renal parenchymal loss. 4 mm calculus peripherally mid left kidney. No hydronephrosis. Urinary bladder physiologically distended. Stomach/Bowel: Stomach and small bowel decompressed. Appendix not identified. The colon is partially distended, without acute finding. Vascular/Lymphatic: Mild scattered aortoiliac calcified plaque. No AAA. Portal vein patent. No abdominal or pelvic adenopathy. Reproductive: Mild prostate enlargement. Other: Bilateral pelvic phleboliths.  No ascites.  No free air. Musculoskeletal: Chronic L1 compression fracture deformity. Stable grade 1 anterolisthesis L4-5. IMPRESSION: 1. Minimally displaced left sixth rib fracture, without pneumothorax or effusion. 2. Healing left seventh and eighth rib fractures. 3. Cholelithiasis. 4. Left nephrolithiasis without hydronephrosis. 5. Chronic L1 compression fracture deformity. 6. Coronary and aortic Atherosclerosis (ICD10-I70.0). Electronically Signed   By: Corlis Leak M.D.   On: 07/03/2023 15:24    Anti-infectives: Anti-infectives (From admission, onward)    None       Assessment/Plan Single left 6th rib fracture - no signs of PTX or HTX - recommend IS, pulm toilet, multimodal pain control (scheduled lidocaine patch, tylenol and robaxin. Tramadol PRN) - PT/OT Hematuria - clearing well  This care required straight-forward level of medical decision making.    LOS: 1 day   De Blanch [facility] 07/04/2023, 10:05 AM Please see Amion for pager number during day hours 7:00am-4:30pm or 7:00am -11:30am on weekends

## 2023-07-04 NOTE — Progress Notes (Signed)
PT Cancellation Note  Patient Details [name] MRN: [id number] DOB: [birth year]   Cancelled Treatment:    Reason Eval/Treat Not Completed: Pain limiting ability to participate (pt reports he's in too much pain to do any mobility today, he asked me to check back tomorrow.  Risks of bedrest were explained to pt.  RN notified of pt request for pain medication. Will re-attempt after administration of pain medication.)   Tamala Ser PT 07/04/2023  Acute Rehabilitation Services  Office [phone]

## 2023-07-04 NOTE — Progress Notes (Signed)
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  He did not lose consciousness and does not think he hit his head but did have some left-sided chest pain.  Given his symptoms he decided to come to the hospital and he was noted to have some gross hematuria though started clearing up.  Lab works showed an increased creatinine from his normal baseline.  Urinalysis completed and showed evidence of microscopic hematuria well.  He underwent a CT head which was unremarkable as well as CT of the chest abdomen pelvis which showed an acute left sixth rib fracture and other prior well-healed rib fractures.  He was seen by general surgery and urology and PT OT recommended SNF.  His gross hematuria is improved.  Assessment and Plan:  [name] is a 77 y.o. male with medical history significant for heart failure with reduced EF, recent hospital stay for suspected ESBL UTI, CAD status post coronary stent March 2024 on aspirin and Plavix, insulin-dependent type 2 diabetes, hypertension, Parkinson's disease, hyperlipidemia, GERD being admitted to the hospital with left-sided acute rib fracture and gross hematuria after a mechanical fall in the shower day before last.    Mechanical Fall with Left Sixth Rib Fracture and Generalized Weakness -With minimal  displacement, no associated hematoma or pneumothorax. -Inpatient admission -Pain control with tramadol, lidocaine patch, IV Dilaudid for severe pain -Appreciate trauma surgery consultation, they will follow -Order Incentive Spirometry and pulmonary toileting -PT OT recommending SNF   Gross Hematuria -After recent trauma, patient is urinating well without current evidence of retention.  Family states urine seems to be clearing up and less bloody. -Patient is hemodynamically stable without anemia -Discussed with Dr. Laverle Patter of urology, who recommends no Foley catheter, okay to continue antiplatelet therapy for now -Dr. Laverle Patter recommending outpatient follow up -Foley and CBI in case of retention   Heart Failure with reduced EF -Patient currently appears euvolemic -Continue home dose Lasix -Continue Sacubitril-Valsartan 24-26 mg 1 tab po BID and     Insulin-Dependent Type 2 Diabetes -Poorly controlled with hemoglobin A1c 11% in June 2024 -Carb controlled diet when eating -Reduced dose Lantus nightly to 8 units -C/w Moderate Novolog Sliding Scale Insulin  -CBG and Glucose Trend: Recent Labs  Lab 06/25/23 0554 06/25/23 0815 06/25/23 1133 07/03/23 2107 07/04/23 0732 07/04/23 1245 07/04/23 1733  GLUCAP 119* 193* 186* 189* 155* 130* 164*   Recent Labs  Lab 06/23/23 1843 06/24/23 0604 06/25/23 0608 07/03/23 1126 07/04/23 0612  GLUCOSE 181* 202* 121* 163* 185*    Parkinson's Disease -Continue Sinemet -Outpatient follow up with Neurology Dr. Arbutus Leas   CAD -Status post PCI in March -Continue Aspirin and Plavix and Rosuvastatin -Resume Metoprolol Succinate 25 mg po Daily  -May have to consider holding antiplatelets in case of severe bleeding however improving Hematuria    AKI/Acute Renal Failure vs CKD Stage 3a -Mild, possibly due to mild  dehydration since his fall, or intermittent urinary retention due to his gross hematuria.   -BUN/Cr Trend: Recent Labs  Lab 06/23/23 1843  06/24/23 0135 06/24/23 0604 06/25/23 0608 07/03/23 1126 07/04/23 0612  BUN 20  --  25* 15 33* 25*  CREATININE 1.35* 1.30* 1.41* 1.13 1.27* 1.35*  -Avoid Nephrotoxic Medications, Contrast Dyes, Hypotension and Dehydration to Ensure Adequate Renal Perfusion and will need to Renally Adjust Meds -Continue to Monitor and Trend Renal Function carefully and repeat CMP in the AM   Subconjunctival Hemorrhage -Likely due to Plavix use, followed by Dr. Dione Booze -Continue supportive care   Depression -C/w Sertraline 50 mg Daily   GERD/GI Prophylaxis -C/w Pantoprazole 40 mg po Daily   Overweight -Complicates overall prognosis and care -Estimated body mass index is 29.27 kg/m as calculated from the following:   Height as of this encounter: 5\' 8"  (1.727 m).   Weight as of this encounter: 87.3 kg.  -Weight Loss and Dietary Counseling given  DVT prophylaxis: SCDs Start: 07/03/23 1714    Code Status: Limited: Do not attempt resuscitation (DNR) -DNR-LIMITED -Do Not Intubate/DNI  Family Communication: Discussed with wife at bedside  Disposition Plan:  Level of care: Telemetry Status is: Inpatient Remains inpatient appropriate because: Needs SNF and will need Insurance Auth and Bed Availability   Consultants:  Trauma Surgery Urology   Procedures:  As delineated as above  Antimicrobials:  Anti-infectives (From admission, onward)    None       Subjective: Seen and examined at bedside and he he just worked with therapy.  Felt okay.  States his hematuria is cleared up.  States that he has been falling more frequently than not.  No nausea or vomiting.  No other concerns or complaint at this time.  Objective: Vitals:   07/03/23 2106 07/04/23 0247 07/04/23 0609 07/04/23 1735  BP: 131/63 (!) 125/59 138/66 131/68  Pulse: (!) 48 (!) 55 (!) 48 (!) 50  Resp: 18 18 18 16   Temp: 98.1 F (36.7 C) 98.4 F (36.9 C) 98.2 F (36.8 C) 97.8 F (36.6 C)  TempSrc:   Oral   SpO2: 95% 93% 96% 97%   Weight:      Height:        Intake/Output Summary (Last 24 hours) at 07/04/2023 1844 Last data filed at 07/04/2023 0500 Gross per 24 hour  Intake 240 ml  Output 850 ml  Net -610 ml   Filed Weights   07/03/23 1043  Weight: 87.3 kg   Examination: Physical Exam:  Constitutional: WN/WD elderly Caucasian male in no acute distress appears calm Respiratory: Diminished to auscultation bilaterally, no wheezing, rales, rhonchi or crackles. Normal respiratory effort and patient is not tachypenic. No accessory muscle use.  Unlabored breathing Cardiovascular: RRR, no murmurs / rubs / gallops. S1 and S2 auscultated.  Abdomen: Soft, non-tender, mildly distended secondary body habitus. Bowel sounds positive.  GU: Deferred. Musculoskeletal: No clubbing / cyanosis of digits/nails. No joint deformity upper and lower extremities.  Skin: No rashes, lesions, ulcers on limited skin evaluation. No induration; Warm and dry.  Neurologic: CN 2-12 grossly intact with no focal deficits. Romberg sign cerebellar reflexes not assessed.  Has some parkinsonian tremors that are mild currently Psychiatric: Normal judgment and insight. Alert and oriented x 3. Normal mood and appropriate affect.   Data Reviewed: I have personally reviewed following labs and imaging studies  CBC: Recent Labs  Lab 07/03/23 1126 07/04/23 0612  WBC 7.1 5.1  NEUTROABS 5.2  --  HGB 14.3 13.4  HCT 45.5 42.7  MCV 93.8 93.8  PLT 244 212   Basic Metabolic Panel: Recent Labs  Lab 07/03/23 1126 07/04/23 0612  NA 140 138  K 4.3 4.4  CL 104 103  CO2 25 29  GLUCOSE 163* 185*  BUN 33* 25*  CREATININE 1.27* 1.35*  CALCIUM 9.2 8.7*   GFR: Estimated Creatinine Clearance: 50 mL/min (A) (by C-G formula based on SCr of 1.35 mg/dL (H)). Liver Function Tests: Recent Labs  Lab 07/03/23 1126  AST 11*  ALT <5  ALKPHOS 79  BILITOT 0.7  PROT 6.5  ALBUMIN 3.7   No results for input(s): "LIPASE", "AMYLASE" in the last 168  hours. No results for input(s): "AMMONIA" in the last 168 hours. Coagulation Profile: No results for input(s): "INR", "PROTIME" in the last 168 hours. Cardiac Enzymes: No results for input(s): "CKTOTAL", "CKMB", "CKMBINDEX", "TROPONINI" in the last 168 hours. BNP (last 3 results) No results for input(s): "PROBNP" in the last 8760 hours. HbA1C: No results for input(s): "HGBA1C" in the last 72 hours. CBG: Recent Labs  Lab 07/03/23 2107 07/04/23 0732 07/04/23 1245 07/04/23 1733  GLUCAP 189* 155* 130* 164*   Lipid Profile: No results for input(s): "CHOL", "HDL", "LDLCALC", "TRIG", "CHOLHDL", "LDLDIRECT" in the last 72 hours. Thyroid Function Tests: No results for input(s): "TSH", "T4TOTAL", "FREET4", "T3FREE", "THYROIDAB" in the last 72 hours. Anemia Panel: No results for input(s): "VITAMINB12", "FOLATE", "FERRITIN", "TIBC", "IRON", "RETICCTPCT" in the last 72 hours. Sepsis Labs: No results for input(s): "PROCALCITON", "LATICACIDVEN" in the last 168 hours.  No results found for this or any previous visit (from the past 240 hour(s)).   Radiology Studies: CT HEAD WO CONTRAST ( )  Result Date: 07/03/2023 CLINICAL DATA:  Minor head trauma EXAM: CT HEAD WITHOUT CONTRAST TECHNIQUE: Contiguous axial images were obtained from the base of the skull through the vertex without intravenous contrast. RADIATION DOSE REDUCTION: This exam was performed according to the departmental dose-optimization program which includes automated exposure control, adjustment of the mA and/or kV according to patient size and/or use of iterative reconstruction technique. COMPARISON:  06/23/2023 FINDINGS: Brain: No acute infarct or hemorrhage. Lateral ventricles and midline structures are stable. No acute extra-axial fluid collections. No mass effect. Vascular: No hyperdense vessel or unexpected calcification. Skull: Normal. Negative for fracture or focal lesion. Sinuses/Orbits: No acute finding. Other: None.  IMPRESSION: 1. No acute intracranial process. Electronically Signed   By: Sharlet Salina M.D.   On: 07/03/2023 15:28   CT CHEST ABDOMEN PELVIS W CONTRAST  Result Date: 07/03/2023 CLINICAL DATA:  Larey Seat, left-sided pain, gross hematuria EXAM: CT CHEST, ABDOMEN, AND PELVIS WITH CONTRAST TECHNIQUE: Multidetector CT imaging of the chest, abdomen and pelvis was performed following the standard protocol during bolus administration of intravenous contrast. RADIATION DOSE REDUCTION: This exam was performed according to the departmental dose-optimization program which includes automated exposure control, adjustment of the mA and/or kV according to patient size and/or use of iterative reconstruction technique. CONTRAST:  OMNIPAQUE IOHEXOL 300 MG/ML  SOLN COMPARISON:  06/14/2022 and previous FINDINGS: CT CHEST FINDINGS Cardiovascular: SVC patent. Heart size upper limits normal. No pericardial effusion. 3-vessel coronary calcifications. Central great vessels unremarkable. Mediastinum/Nodes: No mediastinal hematoma, mass, or adenopathy. Lungs/Pleura: No pleural effusion. No pneumothorax. Dependent atelectasis/infiltrate in both lung bases, left greater than right. Musculoskeletal: Minimally displaced fracture, anterolateral aspect left sixth rib. Healing fractures of left seventh and eighth ribs laterally. CT ABDOMEN PELVIS FINDINGS Hepatobiliary: 2 cm lamellated gallstone near the gallbladder neck.  Gallbladder is nondilated. No focal liver lesion or biliary ductal dilatation. Pancreas: Unremarkable. No pancreatic ductal dilatation or surrounding inflammatory changes. Spleen: Normal in size without focal abnormality. Adrenals/Urinary Tract: No adrenal mass. Scattered areas of renal parenchymal loss. 4 mm calculus peripherally mid left kidney. No hydronephrosis. Urinary bladder physiologically distended. Stomach/Bowel: Stomach and small bowel decompressed. Appendix not identified. The colon is partially distended, without  acute finding. Vascular/Lymphatic: Mild scattered aortoiliac calcified plaque. No AAA. Portal vein patent. No abdominal or pelvic adenopathy. Reproductive: Mild prostate enlargement. Other: Bilateral pelvic phleboliths.  No ascites.  No free air. Musculoskeletal: Chronic L1 compression fracture deformity. Stable grade 1 anterolisthesis L4-5. IMPRESSION: 1. Minimally displaced left sixth rib fracture, without pneumothorax or effusion. 2. Healing left seventh and eighth rib fractures. 3. Cholelithiasis. 4. Left nephrolithiasis without hydronephrosis. 5. Chronic L1 compression fracture deformity. 6. Coronary and aortic Atherosclerosis (ICD10-I70.0). Electronically Signed   By: Corlis Leak M.D.   On: 07/03/2023 15:24    Scheduled Meds:  acetaminophen  650 mg Oral Q6H   aspirin EC  81 mg Oral Daily   carbidopa-levodopa  3 tablet Oral 6 times per day   clopidogrel  75 mg Oral Q breakfast   furosemide  20 mg Oral BID   insulin aspart  0-15 Units Subcutaneous TID WC   insulin aspart  0-5 Units Subcutaneous QHS   insulin glargine-yfgn  8 Units Subcutaneous QHS   lidocaine  1 patch Transdermal Q24H   methocarbamol  500 mg Oral Q6H   [START ON 07/05/2023] metoprolol succinate  25 mg Oral Daily   pantoprazole  40 mg Oral Daily   rosuvastatin  40 mg Oral Daily   sacubitril-valsartan  1 tablet Oral BID   sertraline  50 mg Oral Daily   tamsulosin  0.4 mg Oral Daily   Continuous Infusions:   LOS: 1 day   Marguerita Merles, DO [facility] Available via Epic secure chat 7am-7pm After these hours, please refer to coverage provider listed on [email or web address] 07/04/2023, 6:44 PM

## 2023-07-04 NOTE — Hospital Course (Addendum)
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  He did not lose consciousness and does not think he hit his head but did have some left-sided chest pain.  Given his symptoms he decided to come to the hospital and he was noted to have some gross hematuria though started clearing up.  Lab works showed an increased creatinine from his normal baseline.  Urinalysis completed and showed evidence of microscopic hematuria well.  He underwent a CT head which was unremarkable as well as CT of the chest abdomen pelvis which showed an acute left sixth rib fracture and other prior well-healed rib fractures.  He was seen by general surgery and urology and PT OT recommended SNF.  His gross hematuria is improved.  This morning he had a period of unresponsiveness and a stroke alert was called.  Stroke was ruled out and this felt that his decreased responsiveness was in the setting of his Parkinson's and it was felt that he had "frozen" episode.  His Sinemet has been resumed.  Neurology recommended obtaining EEG for completion but no further workup was needed.  They recommended that the patient receive a Sinemet scheduled.  Patient is now deemed medically stable and awaiting SNF placement and insurance authorization.  Assessment and Plan:  [name] is a 77 y.o. male with medical history significant for heart failure with reduced EF, recent hospital stay for suspected ESBL UTI, CAD status post  coronary stent March 2024 on aspirin and Plavix, insulin-dependent type 2 diabetes, hypertension, Parkinson's disease, hyperlipidemia, GERD being admitted to the hospital with left-sided acute rib fracture and gross hematuria after a mechanical fall in the shower day before last.    Mechanical Fall with Left Sixth Rib Fracture and Generalized Weakness -With minimal displacement, no associated hematoma or pneumothorax. -Inpatient admission -Pain control with tramadol, lidocaine patch, IV Dilaudid for severe pain -Appreciate trauma surgery consultation, they will follow -Order Incentive Spirometry and pulmonary toileting -PT OT recommending SNF and selected an once form and bed availability will be on Monday.   Decreased Responsiveness and Aphasia, improved -In the setting of Parkinson's and likely "Frozen" -Stroke Ruled Out given MRI Negative for CVA -CT Head done and showed "Stable noncontrast CT appearance of the brain from two days ago, negative for age. ASPECTS 10." -CTA Head and Neck done and showed "CTA is negative; No large vessel occlusion. And minimal to mild for age atherosclerosis in the head and neck with no arterial stenosis." -MRI Brain one and showed "No acute intracranial abnormality. 2. Mild for age signal changes in the cerebral white matter and a solitary chronic microhemorrhage in the right midbrain most compatible with chronic small vessel disease." -SLP evaluated and feel he is a mild aspiration risk and recommending Regular Diet and Thin Liquid -Ensure patient gets Sinemet as scheduled and resume Entacapone -Check EEG and done and showed "This study is within normal limits. No seizures or epileptiform discharges were seen throughout the recording.  A normal interictal EEG does not exclude the diagnosis of epilepsy." -  Patient appears baseline now; IVF now stopped and he remains medically stable for D/C  Gross Hematuria -After recent trauma, patient is urinating well  without current evidence of retention.  Family states urine seems to be clearing up and less bloody. -Patient is hemodynamically stable without anemia -Discussed with Dr. Laverle Patter of urology, who recommends no Foley catheter, okay to continue antiplatelet therapy for now -Dr. Laverle Patter recommending outpatient follow up -Foley and CBI in case of retention but seems ok   Heart Failure with reduced EF -Patient currently appears euvolemic -Continue home dose Lasix for now but will start timed Fluids short term -Strict I's and O's and Daily Weights  Intake/Output Summary (Last 24 hours) at 07/07/2023 1601 Last data filed at 07/07/2023 1610 Gross per 24 hour  Intake --  Output 1750 ml  Net -1750 ml  -Continue Sacubitril-Valsartan 24-26 mg 1 tab po BID and Metoprolol Succinate 25 mg po daily  -Continue to monitor for signs and symptoms of volume overload   Insulin-Dependent Type 2 Diabetes -Poorly controlled with hemoglobin A1c 11% in June 2024 -Carb controlled diet when eating -Reduced dose Lantus nightly to 8 units -C/w Moderate Novolog Sliding Scale Insulin  -CBG and Glucose Trend: Recent Labs  Lab 07/05/23 2041 07/06/23 0725 07/06/23 1132 07/06/23 1624 07/06/23 2129 07/07/23 0807 07/07/23 1154  GLUCAP 197* 254* 125* 215* 179* 205* 164*   Recent Labs  Lab 06/24/23 0604 06/25/23 9604 07/03/23 1126 07/04/23 0612 07/05/23 0554 07/06/23 5409 07/07/23 0637  GLUCOSE 202* 121* 163* 185* 134* 270* 198*    Parkinson's Disease -Continue Sinemet as scheduled and now resume Entacapone 200 mg po TID -Outpatient follow up with Neurology Dr. Arbutus Leas at discharge   CAD -Status post PCI in March -Continue Aspirin and Plavix and Rosuvastatin -Resume Metoprolol Succinate 25 mg po Daily  -May have to consider holding antiplatelets in case of severe bleeding however improving Hematuria and resolution    CKD Stage 3a, stable -Initially thought to be an AKI but likely has CKD stage IIIa and was  mild, possibly due to mild dehydration since his fall, or intermittent urinary retention due to his gross hematuria.   -BUN/Cr Trend: Recent Labs  Lab 06/24/23 0604 06/25/23 0608 07/03/23 1126 07/04/23 0612 07/05/23 0554 07/06/23 8119 07/07/23 0637  BUN 25* 15 33* 25* 24* 26* 20  CREATININE 1.41* 1.13 1.27* 1.35* 0.99 1.40* 1.07  -IVF now stopped -Avoid Nephrotoxic Medications, Contrast Dyes, Hypotension and Dehydration to Ensure Adequate Renal Perfusion and will need to Renally Adjust Meds -Continue to Monitor and Trend Renal Function carefully and repeat CMP in the AM   Hypophosphatemia -Phos Level Trend: Recent Labs  Lab 07/05/23 0554 07/06/23 0632 07/07/23 0637  PHOS 3.2 3.0 2.4*  -Replete with po K Phos Neutral 500 mg po x1 -Continue to Monitor and Trend and Repeat Phos Level in the AM  Subconjunctival Hemorrhage -Likely due to Plavix use, followed by Dr. Dione Booze -Continue supportive care and monitoring    Depression -C/w Sertraline 50 mg Daily   GERD/GI Prophylaxis -C/w Pantoprazole 40 mg po Daily   Hypoalbuminemia -Patient's Albumin Trend: Recent Labs  Lab 06/25/23 0608 07/03/23 1126 07/05/23 0554 07/06/23 0632 07/07/23 0637  ALBUMIN 3.1* 3.7 3.2* 3.3* 3.2*  -Continue to Monitor and Trend and repeat CMP in the AM  Overweight -Complicates overall prognosis and care -Estimated body mass index is 29.27 kg/m as calculated from the following:   Height as of this encounter: 5\' 8"  (1.727 m).   Weight as of  this encounter: 87.3 kg.  -Weight Loss and Dietary Counseling given

## 2023-07-04 NOTE — TOC Initial Note (Signed)
Transition of Care [facility]) - Initial/Assessment Note    Patient Details  [name] MRN: [id number] Date of Birth: [birth year]  Transition of Care [facility]) CM/SW Contact:    Eric Santee, LCSW Phone Number: 07/04/2023, 2:31 PM  Clinical Narrative:                 Pt from home with spouse. Pt has been receiving HHPT through [facility].  Pt requests for CSW to speak to spouse about discharge planning. Spoke with wife who is agreeable to recommendation for SNF. She shares pt has been to SNF at [facility] in the past however, only stayed two days due to the "beds being old" and not satisfied with the care. She does not currently have alternate preference for facility.  SNF referrals have been faxed out and currently awaiting bed offers.  Expected Discharge Plan: Skilled Nursing Facility Barriers to Discharge: Continued Medical Work up, SNF Pending bed offer   Patient Goals and CMS Choice Patient states their goals for this hospitalization and ongoing recovery are:: For pt to go to rehab CMS Medicare.gov Compare Post Acute Care list provided to:: Patient Choice offered to / list presented to : Patient, Spouse [place] ownership interest in [facility].provided to:: Spouse    Expected Discharge Plan and Services In-house Referral: Clinical Social Work Discharge Planning Services: NA Post Acute Care Choice: Skilled Nursing Facility Living arrangements for the past 2 months: Single Family Home                 DME Arranged: N/A DME Agency: NA                  Prior Living Arrangements/Services Living arrangements for the past 2 months: Single Family Home Lives with:: Spouse Patient language and need for interpreter reviewed:: Yes Do you feel safe going back to the place where you live?: Yes      Need for Family Participation in Patient Care: Yes (Comment) Care giver support system in place?: Yes (comment) Current home services: DME, Home PT Criminal  Activity/Legal Involvement Pertinent to Current Situation/Hospitalization: No - Comment as needed  Activities of Daily Living Home Assistive Devices/Equipment: Cane (specify quad or straight), Eyeglasses, Grab bars around toilet, Grab bars in shower, Walker (specify type) ADL Screening (condition at time of admission) Patient's cognitive ability adequate to safely complete daily activities?: Yes Is the patient deaf or have difficulty hearing?: No Does the patient have difficulty seeing, even when wearing glasses/contacts?: Yes (L eye) Does the patient have difficulty concentrating, remembering, or making decisions?: No Patient able to express need for assistance with ADLs?: Yes Does the patient have difficulty dressing or bathing?: Yes Independently performs ADLs?: No Communication: Independent Dressing (OT): Needs assistance Is this a change from baseline?: Change from baseline, expected to last <3days Grooming: Independent with device (comment) Feeding: Independent Bathing: Independent with device (comment) Toileting: Needs assistance Is this a change from baseline?: Change from baseline, expected to last <3 days In/Out Bed: Needs assistance Is this a change from baseline?: Change from baseline, expected to last <3 days Walks in Home: Independent with device (comment) Does the patient have difficulty walking or climbing stairs?: No Weakness of Legs: Both Weakness of Arms/Hands: None  Permission Sought/Granted Permission sought to share information with : Facility Medical sales representative, Family Supports Permission granted to share information with : Yes, Verbal Permission Granted  Share Information with [name]  Permission granted to share info w AGENCY: SNF's  Permission granted to share info w Relationship: Spouse  Permission granted to share info w Contact Information: [phone]  Emotional Assessment Appearance:: Appears stated age Attitude/Demeanor/Rapport:  Engaged Affect (typically observed): Pleasant Orientation: : Oriented to Self, Oriented to Place, Oriented to  Time, Oriented to Situation Alcohol / Substance Use: Not Applicable Psych Involvement: No (comment)  Admission diagnosis:  Gross hematuria [R31.0] Closed fracture of one rib of left side, initial encounter [S22.32XA] Patient Active Problem List   Diagnosis Date Noted   Gross hematuria 07/03/2023   Depression 06/24/2023   Chemosis 06/24/2023   Elevated troponin 04/03/2023   Sepsis (HCC) 04/01/2023   Coronary artery disease involving native coronary 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 angina pectoris 01/11/2023   Chronic combined systolic and diastolic heart failure (HCC)    Dysphagia 04/29/2022   AKI (acute kidney injury) (HCC)    Dehydration    Failure to thrive (child)    Bacteriuria, asymptomatic    Failure to thrive in adult 04/20/2022   Malaise, possible ESBL Klebsiella UTI 04/20/2022   Chronic retention of urine 04/20/2022   Closed lumbar vertebral fracture (HCC) 04/20/2022   UTI (urinary tract infection) 03/01/2022   Leukocytosis 01/01/2022   Hyponatremia 01/01/2022   CKD (chronic kidney disease), stage IIIa (HCC) 01/01/2022   HTN (hypertension) 12/31/2021   History of ESBL Klebsiella pneumoniae infection 12/31/2021   Generalized weakness 12/31/2021   Chronic venous insufficiency 08/29/2021   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   Encounter for general adult medical examination with abnormal findings 02/18/2018   Enlarged prostate 02/18/2018   Hearing loss 02/18/2018   Other long term (current) drug therapy 02/18/2018   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) 02/18/2018   HLD (hyperlipidemia)    Nephrolithiasis    Parkinson disease (HCC)    Diabetes mellitus with coincident hypertension (HCC)    Syncope    Renal calculus    Diabetes (HCC)    Allergic rhinitis    BPH (benign prostatic hyperplasia)    Erectile dysfunction    Dysphagia,  pharyngeal phase    Edema    Ureteral calculus 02/09/2013   LEG PAIN 03/14/2010   PCP:  Emilio Aspen, MD Pharmacy:   [facility] - [place], [place] - [address] [place] [address] Phone: [phone] Fax: [phone]     Social Determinants of Health (SDOH) Social History: SDOH Screenings   Food Insecurity: No Food Insecurity (07/04/2023)  Housing: Low Risk  (07/04/2023)  Recent Concern: Housing - Medium Risk (06/24/2023)  Transportation Needs: No Transportation Needs (07/04/2023)  Utilities: Not At Risk (07/04/2023)  Depression (PHQ2-9): Low Risk  (05/29/2022)  Tobacco Use: Low Risk  (07/03/2023)   SDOH Interventions:     Readmission Risk Interventions    07/04/2023    2:29 PM 06/25/2023   12:33 PM 05/08/2022    3:02 PM  Readmission Risk Prevention Plan  Transportation Screening Complete Complete Complete  PCP or Specialist Appt within 5-7 Days Complete Complete   PCP or Specialist Appt within 3-5 Days   Complete  Home Care Screening Complete Complete   Medication Review (RN CM) Complete Referral to Pharmacy   HRI or Home Care Consult   Complete  Social Work Consult for Recovery Care Planning/Counseling   Complete  Palliative Care Screening   Not Applicable  Medication Review Oceanographer)   Complete

## 2023-07-05 ENCOUNTER — Inpatient Hospital Stay (HOSPITAL_COMMUNITY): Payer: Medicare Other

## 2023-07-05 ENCOUNTER — Inpatient Hospital Stay (HOSPITAL_COMMUNITY)
Admit: 2023-07-05 | Discharge: 2023-07-05 | Disposition: A | Discharge status: Home / Self Care | Payer: Medicare Other | Attending: Internal Medicine | Admitting: Internal Medicine

## 2023-07-05 DIAGNOSIS — S2232XA Fracture of one rib, left side, initial encounter for closed fracture: Secondary | ICD-10-CM | POA: Diagnosis not present

## 2023-07-05 DIAGNOSIS — R4182 Altered mental status, unspecified: Secondary | ICD-10-CM | POA: Diagnosis not present

## 2023-07-05 DIAGNOSIS — R4189 Other symptoms and signs involving cognitive functions and awareness: Secondary | ICD-10-CM

## 2023-07-05 DIAGNOSIS — R310 Gross hematuria: Secondary | ICD-10-CM | POA: Diagnosis not present

## 2023-07-05 DIAGNOSIS — R531 Weakness: Secondary | ICD-10-CM | POA: Diagnosis not present

## 2023-07-05 DIAGNOSIS — W19XXXA Unspecified fall, initial encounter: Secondary | ICD-10-CM | POA: Diagnosis not present

## 2023-07-05 LAB — CBC WITH DIFFERENTIAL/PLATELET
Abs Immature Granulocytes: 0.02 10*3/uL (ref 0.00–0.07)
Basophils Absolute: 0 10*3/uL (ref 0.0–0.1)
Basophils Relative: 0 %
Eosinophils Absolute: 0.2 10*3/uL (ref 0.0–0.5)
Eosinophils Relative: 3 %
HCT: 48 % (ref 39.0–52.0)
Hemoglobin: 14.6 g/dL (ref 13.0–17.0)
Immature Granulocytes: 0 %
Lymphocytes Relative: 15 %
Lymphs Abs: 1 10*3/uL (ref 0.7–4.0)
MCH: 29.3 pg (ref 26.0–34.0)
MCHC: 30.4 g/dL (ref 30.0–36.0)
MCV: 96.4 fL (ref 80.0–100.0)
Monocytes Absolute: 0.5 10*3/uL (ref 0.1–1.0)
Monocytes Relative: 8 %
Neutro Abs: 4.5 10*3/uL (ref 1.7–7.7)
Neutrophils Relative %: 74 %
Platelets: 215 10*3/uL (ref 150–400)
RBC: 4.98 MIL/uL (ref 4.22–5.81)
RDW: 13 % (ref 11.5–15.5)
WBC: 6.2 10*3/uL (ref 4.0–10.5)
nRBC: 0 % (ref 0.0–0.2)

## 2023-07-05 LAB — COMPREHENSIVE METABOLIC PANEL
ALT: <5 U/L (ref 0–44)
AST: 10 U/L — ABNORMAL LOW (ref 15–41)
Albumin: 3.2 g/dL — ABNORMAL LOW (ref 3.5–5.0)
Alkaline Phosphatase: 69 U/L (ref 38–126)
Anion gap: 10 (ref 5–15)
BUN: 24 mg/dL — ABNORMAL HIGH (ref 8–23)
CO2: 24 mmol/L (ref 22–32)
Calcium: 8.7 mg/dL — ABNORMAL LOW (ref 8.9–10.3)
Chloride: 105 mmol/L (ref 98–111)
Creatinine, Ser: 0.99 mg/dL (ref 0.61–1.24)
GFR, Estimated: >60 mL/min (ref >60)
Glucose, Bld: 134 mg/dL — ABNORMAL HIGH (ref 70–99)
Potassium: 3.5 mmol/L (ref 3.5–5.1)
Sodium: 139 mmol/L (ref 135–145)
Total Bilirubin: 0.8 mg/dL (ref 0.3–1.2)
Total Protein: 6 g/dL — ABNORMAL LOW (ref 6.5–8.1)

## 2023-07-05 LAB — GLUCOSE, CAPILLARY
Glucose-Capillary: 138 mg/dL — ABNORMAL HIGH (ref 70–99)
Glucose-Capillary: 128 mg/dL — ABNORMAL HIGH (ref 70–99)
Glucose-Capillary: 203 mg/dL — ABNORMAL HIGH (ref 70–99)
Glucose-Capillary: 134 mg/dL — ABNORMAL HIGH (ref 70–99)
Glucose-Capillary: 197 mg/dL — ABNORMAL HIGH (ref 70–99)

## 2023-07-05 LAB — MAGNESIUM: Magnesium: 2.4 mg/dL (ref 1.7–2.4)

## 2023-07-05 LAB — PHOSPHORUS: Phosphorus: 3.2 mg/dL (ref 2.5–4.6)

## 2023-07-05 MED ORDER — VITAMIN D 25 MCG (1000 UNIT) PO TABS
2000.0000 [IU] | ORAL_TABLET | Freq: Every day | ORAL | Status: DC
  Administered 2023-07-06 – 2023-07-08 (×3): 2000 [IU] via ORAL
  Filled 2023-07-05 (×3): qty 2

## 2023-07-05 MED ORDER — SODIUM CHLORIDE 0.9 % IV SOLN: INTRAVENOUS | Status: DC

## 2023-07-05 MED ORDER — POTASSIUM CHLORIDE CRYS ER 10 MEQ PO TBCR
10.0000 meq | EXTENDED_RELEASE_TABLET | Freq: Two times a day (BID) | ORAL | Status: DC
  Administered 2023-07-05 – 2023-07-08 (×6): 10 meq via ORAL
  Filled 2023-07-05 (×6): qty 1

## 2023-07-05 MED ORDER — ROPINIROLE HCL 1 MG PO TABS
3.0000 mg | ORAL_TABLET | Freq: Two times a day (BID) | ORAL | Status: DC
  Administered 2023-07-06 – 2023-07-08 (×6): 3 mg via ORAL
  Filled 2023-07-05 (×6): qty 3

## 2023-07-05 MED ORDER — ROPINIROLE HCL 1 MG PO TABS
1.5000 mg | ORAL_TABLET | Freq: Every day | ORAL | Status: DC
  Administered 2023-07-05 – 2023-07-07 (×3): 1.5 mg via ORAL
  Filled 2023-07-05 (×3): qty 2

## 2023-07-05 MED ORDER — ENTACAPONE 200 MG PO TABS
200.0000 mg | ORAL_TABLET | ORAL | Status: DC
  Administered 2023-07-06 – 2023-07-08 (×8): 200 mg via ORAL
  Filled 2023-07-05 (×9): qty 1

## 2023-07-05 MED ORDER — ENOXAPARIN SODIUM 40 MG/0.4ML IJ SOSY
40.0000 mg | PREFILLED_SYRINGE | Freq: Every day | INTRAMUSCULAR | Status: DC
  Administered 2023-07-05 – 2023-07-07 (×3): 40 mg via SUBCUTANEOUS
  Filled 2023-07-05 (×3): qty 0.4

## 2023-07-05 MED ORDER — VITAMIN B-12 1000 MCG PO TABS
2000.0000 ug | ORAL_TABLET | Freq: Every day | ORAL | Status: DC
  Administered 2023-07-06 – 2023-07-08 (×3): 2000 ug via ORAL
  Filled 2023-07-05 (×3): qty 2

## 2023-07-05 MED ORDER — METHENAMINE MANDELATE 0.5 G PO TABS
500.0000 mg | ORAL_TABLET | Freq: Four times a day (QID) | ORAL | Status: DC
  Administered 2023-07-05 – 2023-07-08 (×11): 500 mg via ORAL
  Filled 2023-07-05 (×13): qty 1

## 2023-07-05 MED ORDER — IOHEXOL 350 MG/ML SOLN
100.0000 mL | Freq: Once | INTRAVENOUS | Status: AC | PRN
  Administered 2023-07-05: 75 mL via INTRAVENOUS

## 2023-07-05 NOTE — Evaluation (Signed)
Clinical/Bedside Swallow Evaluation Patient Details  [name] MRN: [id number] Date of Birth: 07-Apr-1946  Today's Date: 07/05/2023 Time: SLP Start Time (ACUTE ONLY): 1035 SLP Stop Time (ACUTE ONLY): 1101 SLP Time Calculation (min) (ACUTE ONLY): 26 min  Past Medical History:  Past Medical History:  Diagnosis Date   Abnormal involuntary movement 02/18/2018   Allergic rhinitis    Arthritis    BPH (benign prostatic hyperplasia)    Diabetes (HCC)    Dysphagia, pharyngeal phase    GERD diagnosed on barium swallow. Has small hiatal hernia. Symptomatically somewhat better on omeprazole but not entirely. We'll try b.i.d. therapy   Edema    1+ in both ankles, likely multifactorial including medication such as Requip   Erectile dysfunction    Staxyn 10 mg or Viagra worked well. 3 samples of Cialis 20 mg provided   GERD (gastroesophageal reflux disease)    Hypercholesteremia    Hypertension    Nephrolithiasis    Onychomycosis of toenail    April 27, 2013 - Dr. Merwyn Katos - podiatry, was in [place] - treating with oral Lamisil and topical nail therapy   Parkinson's disease     followed by Dr. Raquel Sarna at [facility] and Lesia Sago, M.D. in [place]   Presbycusis    and tinnitus - Dr. Serena Colonel - August/2013   Renal calculus    Syncope    Past Surgical History:  Past Surgical History:  Procedure Laterality Date   CORONARY STENT INTERVENTION N/A 01/11/2023   Procedure: CORONARY STENT INTERVENTION;  Surgeon: Kathleene Hazel, MD;  Location: [facility];  Service: Cardiovascular;  Laterality: N/A;   HAND SURGERY     INGUINAL HERNIA REPAIR Left 11/04/2015   Procedure: LEFT INGUINAL HERNIA REPAIR WITH MESH;  Surgeon: Darnell Level, MD;  Location: [facility];  Service: General;  Laterality: Left;   INSERTION OF MESH Left 11/04/2015   Procedure: INSERTION OF MESH;  Surgeon: Darnell Level, MD;  Location: [facility];  Service: General;  Laterality:  Left;   JOINT REPLACEMENT Bilateral    KNEE SURGERY     LEFT HEART CATH AND CORONARY ANGIOGRAPHY N/A 01/11/2023   Procedure: LEFT HEART CATH AND CORONARY ANGIOGRAPHY;  Surgeon: Kathleene Hazel, MD;  Location: [facility];  Service: Cardiovascular;  Laterality: N/A;   TOTAL KNEE ARTHROPLASTY     HPI:  77 year old male with history of Parkinson's, frequent falls, CHF, GERD.  Patient with brief episode in hospital where he was not really following directions and concern was present for CVA.  MRI negative for acute CVA and SLP swallow eval as well as cognitive eval ordered.  Patient resides at home with his wife.  Patient has undergone prior esophagram in 2017 that showed hiatal hernia, esophageal dysmotility with tertiary contractions, and intact pharyngeal swallow appeared functional.  Indication for esophagram was patient complained of hoarseness and problems with swallowing.  Patient and wife denied him having significant issues with swallowing they do endorse that it within the last few months he has had difficulty swallowing large pills sensing them sticking in his pharynx (pointing to proximal area).    Assessment / Plan / Recommendation  Clinical Impression  Patient presents with clinical indication of potential minimal dysphagia likely due to Parkinson's impacting swallow initiation.  Endorse sensing retention of large pills in the proximal pharynx (at vallecular space) stating at times he brings them back up into his mouth and reswallows.  His wife reports that  this has been occurring recently therefore she cut his large pills.  Occasional throat clearing noted after liquid swallows, but patient has significant history of reflux and dysmotility that may be the source of this phenomenon.  He did easily pass a 3 ounce Yale water challenge.   With lingual protrusion occasionally deviation noted to the left without his awareness, but he was able easily to move midline.  Appearance of  minimal increased facial fullness on his right, wife looked at his face and states this is his baseline.  Recommend regular diet consistency with strict precautions given his rib fractures causing pain and will take his cough/airway protection if he would aspirate.  Will follow-up once for swallowing especially given history of esophageal dysmotility, presbyesophagus and will conduct SLP/cognitive eval as early as possible.  Thank you for this consult. SLP Visit Diagnosis: Dysphagia, unspecified (R13.10)    Aspiration Risk  Mild aspiration risk    Diet Recommendation Regular;Thin liquid    Liquid Administration via: Cup;Straw Medication Administration: Whole meds with puree Supervision: Patient able to self feed Compensations: Slow rate;Small sips/bites Postural Changes: Seated upright at 90 degrees;Remain upright for at least 30 minutes after po intake    Other  Recommendations Oral Care Recommendations: Oral care BID    Recommendations for follow up therapy are one component of a multi-disciplinary discharge planning process, led by the attending physician.  Recommendations may be updated based on patient status, additional functional criteria and insurance authorization.  Follow up Recommendations        Assistance Recommended at Discharge    Functional Status Assessment Patient has had a recent decline in their functional status and/or demonstrates limited ability to make significant improvements in function in a reasonable and predictable amount of time  Frequency and Duration min 1 x/week  1 week       Prognosis Prognosis for improved oropharyngeal function: Fair      Swallow Study   General Date of Onset: 07/05/23 HPI: 77 year old male with history of Parkinson's, frequent falls, CHF, GERD.  Patient with brief episode in hospital where he was not really following directions and concern was present for CVA.  MRI negative for acute CVA and SLP swallow eval as well as cognitive  eval ordered.  Patient resides at home with his wife.  Patient has undergone prior esophagram in 2017 that showed hiatal hernia, esophageal dysmotility with tertiary contractions, and intact pharyngeal swallow appeared functional.  Indication for esophagram was patient complained of hoarseness and problems with swallowing.  Patient and wife denied him having significant issues with swallowing they do endorse that it within the last few months he has had difficulty swallowing large pills sensing them sticking in his pharynx (pointing to proximal area). Type of Study: Bedside Swallow Evaluation Diet Prior to this Study: NPO Temperature Spikes Noted: No Respiratory Status: Room air History of Recent Intubation: No Behavior/Cognition: Alert;Cooperative;Pleasant mood Oral Cavity Assessment: Within Functional Limits Oral Care Completed by SLP: Yes Oral Cavity - Dentition: Adequate natural dentition Vision: Functional for self-feeding Self-Feeding Abilities: Able to feed self Patient Positioning: Upright in bed Baseline Vocal Quality: Low vocal intensity;Hoarse Volitional Cough: Strong Volitional Swallow: Unable to elicit    Oral/Motor/Sensory Function Overall Oral Motor/Sensory Function: Other (comment) (Occasionally patient with lingual deviation to the left upon protrusion, but this is inconsistent and off in presentation)   Ice Chips Ice chips: Within functional limits Presentation: Spoon   Thin Liquid Thin Liquid: Impaired Presentation: Self Fed;Straw;Cup;Spoon Oral Phase Functional Implications: Other (  comment) Pharyngeal  Phase Impairments: Throat Clearing - Immediate Other Comments: Potential minimal delay in swallow    Nectar Thick Nectar Thick Liquid: Within functional limits Presentation: Self Fed;Straw   Honey Thick Honey Thick Liquid: Not tested   Puree Puree: Within functional limits Presentation: Self Fed;Spoon   Solid     Solid: Within functional limits Presentation: Self  Fed;Spoon     Rolena Infante, MS [facility] SLP Acute Rehab Services Office [phone]  Chales Abrahams 07/05/2023,11:13 AM

## 2023-07-05 NOTE — TOC Progression Note (Signed)
Transition of Care [facility]) - Progression Note    Patient Details  [name] MRN: [id number] Date of Birth: Jun 27, 1946  Transition of Care [facility]) CM/SW Contact  Otelia Santee, LCSW Phone Number: 07/05/2023, 12:53 PM  Clinical Narrative:    Met with pt, spouse, and daughter at bedside to review bed offers for SNF. Family have accepted offer for [facility] and have requested a private room. Per Lowella Bandy at [facility] they will have private bed available on Monday.  Insurance Berkley Harvey has been requested and is currently pending approval.     Expected Discharge Plan: Skilled Nursing Facility Barriers to Discharge: Continued Medical Work up, SNF Pending bed offer  Expected Discharge Plan and Services In-house Referral: Clinical Social Work Discharge Planning Services: NA Post Acute Care Choice: Skilled Nursing Facility Living arrangements for the past 2 months: Single Family Home                 DME Arranged: N/A DME Agency: NA                   Social Determinants of Health (SDOH) Interventions SDOH Screenings   Food Insecurity: No Food Insecurity (07/04/2023)  Housing: Low Risk  (07/04/2023)  Recent Concern: Housing - Medium Risk (06/24/2023)  Transportation Needs: No Transportation Needs (07/04/2023)  Utilities: Not At Risk (07/04/2023)  Depression (PHQ2-9): Low Risk  (05/29/2022)  Tobacco Use: Low Risk  (07/03/2023)    Readmission Risk Interventions    07/05/2023   12:50 PM 07/04/2023    2:29 PM 06/25/2023   12:33 PM  Readmission Risk Prevention Plan  Transportation Screening Complete Complete Complete  PCP or Specialist Appt within 5-7 Days Complete Complete Complete  Home Care Screening Complete Complete Complete  Medication Review (RN CM) Complete Complete Referral to Pharmacy

## 2023-07-05 NOTE — Progress Notes (Signed)
EEG complete - results pending 

## 2023-07-05 NOTE — Progress Notes (Signed)
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  He did not lose consciousness and does not think he hit his head but did have some left-sided chest pain.  Given his symptoms he decided to come to the hospital and he was noted to have some gross hematuria though started clearing up.  Lab works showed an increased creatinine from his normal baseline.  Urinalysis completed and showed evidence of microscopic hematuria well.  He underwent a CT head which was unremarkable as well as CT of the chest abdomen pelvis which showed an acute left sixth rib fracture and other prior well-healed rib fractures.  He was seen by general surgery and urology and PT OT recommended SNF.  His gross hematuria is improved.  This morning he had a period of unresponsiveness and a stroke alert was called.  Stroke was ruled out and this felt that his decreased responsiveness was in the setting of his Parkinson's and it was felt that he had "frozen" episode.  His Sinemet has been resumed.  Neurology recommended obtaining EEG for completion but no further workup was needed.  They recommended that the patient receive a Sinemet scheduled.  Assessment and Plan:  [name] is a 77 y.o. male with medical history significant for heart failure with reduced EF, recent hospital stay  for suspected ESBL UTI, CAD status post coronary stent March 2024 on aspirin and Plavix, insulin-dependent type 2 diabetes, hypertension, Parkinson's disease, hyperlipidemia, GERD being admitted to the hospital with left-sided acute rib fracture and gross hematuria after a mechanical fall in the shower day before last.    Mechanical Fall with Left Sixth Rib Fracture and Generalized Weakness -With minimal displacement, no associated hematoma or pneumothorax. -Inpatient admission -Pain control with tramadol, lidocaine patch, IV Dilaudid for severe pain -Appreciate trauma surgery consultation, they will follow -Order Incentive Spirometry and pulmonary toileting -PT OT recommending SNF   Decreased Responsiveness and Aphasia -In the setting of Parkinson's and likely "Frozen" -Stroke Ruled Out given MRI Negative for CVA -CT Head done and showed "Stable noncontrast CT appearance of the brain from two days ago, negative for age. ASPECTS 10." -CTA Head and Neck done and showed "CTA is negative; No large vessel occlusion. And minimal to mild for age atherosclerosis in the head and neck with no arterial stenosis." -MRI Brain one and showed "No acute intracranial abnormality. 2. Mild for age signal changes in the cerebral white matter and a solitary chronic microhemorrhage in the right midbrain most compatible with chronic small vessel disease." -SLP evaluated and feel he is a mild aspiration risk and recommending Regular Diet and Thin Liquid -Ensure patient gets Sinemet as scheduled and resume Entacapone -Check EEG and done and showed "This study is within normal limits. No seizures or epileptiform discharges were seen throughout the recording.  A normal interictal EEG does not exclude the diagnosis of epilepsy."  Gross Hematuria -  After recent trauma, patient is urinating well without current evidence of retention.  Family states urine seems to be clearing up and less bloody. -Patient is  hemodynamically stable without anemia -Discussed with Dr. Laverle Patter of urology, who recommends no Foley catheter, okay to continue antiplatelet therapy for now -Dr. Laverle Patter recommending outpatient follow up -Foley and CBI in case of retention   Heart Failure with reduced EF -Patient currently appears euvolemic -Continue home dose Lasix -Strict I's and O's and Daily Weights  Intake/Output Summary (Last 24 hours) at 07/05/2023 1837 Last data filed at 07/05/2023 0550 Gross per 24 hour  Intake --  Output 950 ml  Net -950 ml  -Continue Sacubitril-Valsartan 24-26 mg 1 tab po BID and     Insulin-Dependent Type 2 Diabetes -Poorly controlled with hemoglobin A1c 11% in June 2024 -Carb controlled diet when eating -Reduced dose Lantus nightly to 8 units -C/w Moderate Novolog Sliding Scale Insulin  -CBG and Glucose Trend: Recent Labs  Lab 07/04/23 1245 07/04/23 1733 07/04/23 2134 07/05/23 0648 07/05/23 0842 07/05/23 1202 07/05/23 1706  GLUCAP 130* 164* 297* 138* 128* 203* 134*   Recent Labs  Lab 06/23/23 1843 06/24/23 0604 06/25/23 3086 07/03/23 1126 07/04/23 0612 07/05/23 0554  GLUCOSE 181* 202* 121* 163* 185* 134*    Parkinson's Disease -Continue Sinemet as scheduled and now resume Entacapone 200 mg po TID -Outpatient follow up with Neurology Dr. Arbutus Leas   CAD -Status post PCI in March -Continue Aspirin and Plavix and Rosuvastatin -Resume Metoprolol Succinate 25 mg po Daily  -May have to consider holding antiplatelets in case of severe bleeding however improving Hematuria and resolution    AKI/Acute Renal Failure vs CKD Stage 3a -Mild, possibly due to mild dehydration since his fall, or intermittent urinary retention due to his gross hematuria.   -BUN/Cr Trend: Recent Labs  Lab 06/23/23 1843 06/24/23 0135 06/24/23 0604 06/25/23 0608 07/03/23 1126 07/04/23 0612 07/05/23 0554  BUN 20  --  25* 15 33* 25* 24*  CREATININE 1.35* 1.30* 1.41* 1.13 1.27* 1.35* 0.99  -Avoid  Nephrotoxic Medications, Contrast Dyes, Hypotension and Dehydration to Ensure Adequate Renal Perfusion and will need to Renally Adjust Meds -Continue to Monitor and Trend Renal Function carefully and repeat CMP in the AM   Subconjunctival Hemorrhage -Likely due to Plavix use, followed by Dr. Dione Booze -Continue supportive care   Depression -C/w Sertraline 50 mg Daily   GERD/GI Prophylaxis -C/w Pantoprazole 40 mg po Daily   Hypoalbuminemia -Patient's Albumin Trend: Recent Labs  Lab 06/25/23 0608 07/03/23 1126 07/05/23 0554  ALBUMIN 3.1* 3.7 3.2*  -Continue to Monitor and Trend and repeat CMP in the AM  Overweight -Complicates overall prognosis and care -Estimated body mass index is 29.27 kg/m as calculated from the following:   Height as of this encounter: 5\' 8"  (1.727 m).   Weight as of this encounter: 87.3 kg.  -Weight Loss and Dietary Counseling given   DVT prophylaxis: enoxaparin (LOVENOX) injection 40 mg Start: 07/05/23 2200 SCDs Start: 07/03/23 1714    Code Status: Limited: Do not attempt resuscitation (DNR) -DNR-LIMITED -Do Not Intubate/DNI  Family Communication: Discussed with the patient's wife at bedside  Disposition Plan:  Level of care: Telemetry Status is: Inpatient Remains inpatient appropriate because: Needs insurance authorization and bed availability for SNF   Consultants:  Discussed the case with neurology Dr. Amada Jupiter; teleneurology was consulted  Procedures:  As delineated as above  EEG - This study is within normal limits. No seizures or epileptiform discharges were seen throughout  the recording.   A normal interictal EEG does not exclude the diagnosis of epilepsy.  Antimicrobials:  Anti-infectives (From admission, onward)    Start     Dose/Rate Route Frequency Ordered Stop   07/05/23 1800  methenamine (MANDELAMINE) tablet 500 mg        500 mg Oral 4 times daily 07/05/23 1524         Subjective: Seen and examined at bedside and he had  an unresponsive episode and decreased responsive episode this morning stroke alert was called and he is much more awake and alert.  Complaining of some nausea currently.  No chest pain or lightheadedness or dizziness.  Having some pain.  No other concerns or complaints this time.  Objective: Vitals:   07/04/23 2005 07/05/23 0548 07/05/23 0656 07/05/23 1241  BP: (!) 110/59 (!) 154/64 (!) 164/75 (!) 114/55  Pulse: 62 (!) 46 (!) 51 71  Resp: 16 16    Temp: 98.1 F (36.7 C) 97.7 F (36.5 C) 97.9 F (36.6 C) 98.4 F (36.9 C)  TempSrc: Oral Axillary    SpO2: 93% 98%  91%  Weight:      Height:        Intake/Output Summary (Last 24 hours) at 07/05/2023 1839 Last data filed at 07/05/2023 0550 Gross per 24 hour  Intake --  Output 950 ml  Net -950 ml   Filed Weights   07/03/23 1043  Weight: 87.3 kg   Examination: Physical Exam:  Constitutional: WN/WD elderly Caucasian male in no acute distress Respiratory: Diminished to auscultation bilaterally, no wheezing, rales, rhonchi or crackles. Normal respiratory effort and patient is not tachypenic. No accessory muscle use.  Unlabored breathing Cardiovascular: RRR, no murmurs / rubs / gallops. S1 and S2 auscultated. No extremity edema.  Abdomen: Soft, non-tender, non-distended. Bowel sounds positive.  GU: Deferred. Musculoskeletal: No clubbing / cyanosis of digits/nails. No joint deformity upper and lower extremities. Skin: No rashes, lesions, ulcers on limited skin evaluation but does have some bruising under his left eye. No induration; Warm and dry.  Neurologic: CN 2-12 grossly intact with no focal deficits but has a slight tremor.  Romberg sign cerebellar reflexes not assessed.  Psychiatric: Normal judgment and insight. Alert and oriented x 3. Normal mood and appropriate affect.   Data Reviewed: I have personally reviewed following labs and imaging studies  CBC: Recent Labs  Lab 07/03/23 1126 07/04/23 0612 07/05/23 0554  WBC 7.1 5.1  6.2  NEUTROABS 5.2  --  4.5  HGB 14.3 13.4 14.6  HCT 45.5 42.7 48.0  MCV 93.8 93.8 96.4  PLT 244 212 215   Basic Metabolic Panel: Recent Labs  Lab 07/03/23 1126 07/04/23 0612 07/05/23 0554  NA 140 138 139  K 4.3 4.4 3.5  CL 104 103 105  CO2 25 29 24   GLUCOSE 163* 185* 134*  BUN 33* 25* 24*  CREATININE 1.27* 1.35* 0.99  CALCIUM 9.2 8.7* 8.7*  MG  --   --  2.4  PHOS  --   --  3.2   GFR: Estimated Creatinine Clearance: 68.2 mL/min (by C-G formula based on SCr of 0.99 mg/dL). Liver Function Tests: Recent Labs  Lab 07/03/23 1126 07/05/23 0554  AST 11* 10*  ALT <5 <5  ALKPHOS 79 69  BILITOT 0.7 0.8  PROT 6.5 6.0*  ALBUMIN 3.7 3.2*   No results for input(s): "LIPASE", "AMYLASE" in the last 168 hours. No results for input(s): "AMMONIA" in the last 168 hours. Coagulation Profile: No results  for input(s): "INR", "PROTIME" in the last 168 hours. Cardiac Enzymes: No results for input(s): "CKTOTAL", "CKMB", "CKMBINDEX", "TROPONINI" in the last 168 hours. BNP (last 3 results) No results for input(s): "PROBNP" in the last 8760 hours. HbA1C: No results for input(s): "HGBA1C" in the last 72 hours. CBG: Recent Labs  Lab 07/04/23 2134 2023/08/02 0648 08/02/23 0842 08/02/23 1202 2023-08-02 1706  GLUCAP 297* 138* 128* 203* 134*   Lipid Profile: No results for input(s): "CHOL", "HDL", "LDLCALC", "TRIG", "CHOLHDL", "LDLDIRECT" in the last 72 hours. Thyroid Function Tests: No results for input(s): "TSH", "T4TOTAL", "FREET4", "T3FREE", "THYROIDAB" in the last 72 hours. Anemia Panel: No results for input(s): "VITAMINB12", "FOLATE", "FERRITIN", "TIBC", "IRON", "RETICCTPCT" in the last 72 hours. Sepsis Labs: No results for input(s): "PROCALCITON", "LATICACIDVEN" in the last 168 hours.  No results found for this or any previous visit (from the past 240 hour(s)).   Radiology Studies: EEG adult  Result Date: 08/02/2023 Charlsie Quest, MD     2023/08/02  4:15 PM Patient Name:  [name] MRN: [id number] Epilepsy Attending: Charlsie Quest Referring Physician/Provider: Merlene Laughter, DO Date: August 02, 2023 Duration: 24.45 mins Patient history: 77 yo M with ams getting eeg to evaluate for seizure. Level of alertness: Awake AEDs during EEG study: None Technical aspects: This EEG study was done with scalp electrodes positioned according to the 10-20 International system of electrode placement. Electrical activity was reviewed with band pass filter of 1-70Hz , sensitivity of 7 uV/mm, display speed of 25mm/sec with a 60Hz  notched filter applied as appropriate. EEG data were recorded continuously and digitally stored.  Video monitoring was available and reviewed as appropriate. Description: The posterior dominant rhythm consists of 8 Hz activity of moderate voltage (25-35 uV) seen predominantly in posterior head regions, symmetric and reactive to eye opening and eye closing.  Photic driving was not seen during photic stimulation. Hperventilation was not performed.   IMPRESSION: This study is within normal limits. No seizures or epileptiform discharges were seen throughout the recording. A normal interictal EEG does not exclude the diagnosis of epilepsy. Charlsie Quest   MR BRAIN WO CONTRAST  Result Date: 08-02-2023 CLINICAL DATA:  77 year old male code stroke presentation this morning. Nonverbal. EXAM: MRI HEAD WITHOUT CONTRAST TECHNIQUE: Multiplanar, multiecho pulse sequences of the brain and surrounding structures were obtained without intravenous contrast. COMPARISON:  Head CT and CTA head and neck this morning. Previous brain MRI 10/03/2009. FINDINGS: Brain: No restricted diffusion to suggest acute infarction. No midline shift, mass effect, evidence of mass lesion, ventriculomegaly, extra-axial collection or acute intracranial hemorrhage. Cervicomedullary junction and pituitary are within normal limits. Cerebral volume is only mildly diminished in a generalized fashion since the 2010  MRI. Mild ex vacuo appearing ventricular enlargement. Scattered mild for age cerebral white matter T2 and FLAIR hyperintensity has progressed since 2010. The configuration is nonspecific. No cortical encephalomalacia identified. There is a small chronic microhemorrhage in the right midbrain which was less conspicuous on T2 * imaging in 2010 (series 10, image 25 today). No other chronic cerebral blood products identified. Otherwise negative deep gray nuclei, brainstem and cerebellum for age. Vascular: Major intracranial vascular flow voids are stable since 2010. Skull and upper cervical spine: Negative visible cervical spine. Visualized bone marrow signal is within normal limits. Sinuses/Orbits: Postoperative changes to both globes since 2010. Paranasal Visualized paranasal sinuses and mastoids are stable and well aerated. Other: Visible internal auditory structures appear normal. Negative visible scalp and face. IMPRESSION: 1. No acute intracranial abnormality. 2.  Mild for age signal changes in the cerebral white matter and a solitary chronic microhemorrhage in the right midbrain most compatible with chronic small vessel disease. Electronically Signed   By: Odessa Fleming M.D.   On: 07/05/2023 08:38   CT HEAD CODE STROKE WO CONTRAST  Addendum Date: 07/05/2023   ADDENDUM REPORT: 07/05/2023 07:50 ADDENDUM: Study discussed by telephone with NP ABIGAIL CHAVEZ on 07/05/2023 at 0729 hours. Electronically Signed   By: Odessa Fleming M.D.   On: 07/05/2023 07:50   Result Date: 07/05/2023 CLINICAL DATA:  Code stroke. 77 year old male. Unresponsive. Last known well 0548 hours. EXAM: CT HEAD WITHOUT CONTRAST TECHNIQUE: Contiguous axial images were obtained from the base of the skull through the vertex without intravenous contrast. RADIATION DOSE REDUCTION: This exam was performed according to the departmental dose-optimization program which includes automated exposure control, adjustment of the mA and/or kV according to patient size  and/or use of iterative reconstruction technique. COMPARISON:  Brain MRI 10/03/2009.  Recent head CT 10/02/2023. FINDINGS: Brain: No midline shift, mass effect, or evidence of intracranial mass lesion. No acute intracranial hemorrhage identified. Stable cerebral volume. Stable ventricular size without convincing acute ventriculomegaly. There is mild chronic nonspecific lateral ventricular prominence. No transependymal edema. Gray-white differentiation is stable from 2 days ago and within normal limits for age. No encephalomalacia identified. Vascular: There is some residual intravascular contrast suspected, probably from CT Chest, Abdomen, and Pelvis 2 days ago. No suspicious intracranial vascular hyperdensity. Mild Calcified atherosclerosis at the skull base. Skull: Stable and intact. Sinuses/Orbits: Visualized paranasal sinuses and mastoids are stable and well aerated. Other: No gaze deviation. No acute orbit or scalp soft tissue finding. ASPECTS [facility] Stroke Program Early CT Score) Total score (0-10 with 10 being normal): 10 IMPRESSION: Stable noncontrast CT appearance of the brain from two days ago, negative for age. ASPECTS 10. Electronically Signed: By: Odessa Fleming M.D. On: 07/05/2023 07:24   CT ANGIO HEAD NECK W WO CM (CODE STROKE)  Result Date: 07/05/2023 CLINICAL DATA:  77 year old male.  Recent fall.  Now nonverbal. EXAM: CT ANGIOGRAPHY HEAD AND NECK WITH CONTRAST TECHNIQUE: Multidetector CT imaging of the head and neck was performed using the standard protocol during bolus administration of intravenous contrast. Multiplanar CT image reconstructions and MIPs were obtained to evaluate the vascular anatomy. Carotid stenosis measurements (when applicable) are obtained utilizing NASCET criteria, using the distal internal carotid diameter as the denominator. RADIATION DOSE REDUCTION: This exam was performed according to the departmental dose-optimization program which includes automated exposure control,  adjustment of the mA and/or kV according to patient size and/or use of iterative reconstruction technique. CONTRAST:  75mL OMNIPAQUE IOHEXOL 350 MG/ML SOLN COMPARISON:  Repeat plain head CT this morning 0711 hours. Recent CT venogram 06/23/2023. CT Chest, Abdomen, and Pelvis today are reported separately. 07/03/2023. FINDINGS: CTA NECK Skeleton: Stable. Mild T2 superior endplate compression appears to be chronic. Degenerative endplate changes C6-C7. No acute osseous abnormality identified. Upper chest: Lower lung volumes with atelectasis.  Otherwise stable. Other neck: No acute finding. Aortic arch: Calcified aortic atherosclerosis. 3 vessel arch. Right carotid system: Minimal brachiocephalic artery plaque without stenosis. Tortuous right CCA origin. No significant right CCA, right carotid or cervical right ICA plaque or stenosis. Mildly tortuous right ICA to the skull base. Left carotid system: Tortuosity with no significant plaque or stenosis. Vertebral arteries: Mildly tortuous right subclavian artery origin and mild soft and calcified plaque there but no stenosis. Normal right vertebral artery origin. Tortuous and mildly non dominant right  vertebral artery is otherwise patent and normal to the skull base. Minimal proximal left subclavian artery plaque and tortuosity at the left thoracic inlet but no stenosis. Left vertebral artery origin is normal. Mildly dominant and tortuous left vertebral artery is patent to the skull base with no plaque or stenosis. CTA HEAD Posterior circulation: Codominant distal vertebral arteries and normal vertebrobasilar junction. No significant plaque or stenosis. Normal left PICA origin. Right AICA appears to be dominant. Patent basilar artery without stenosis. Normal SCA and PCA origins. Small right posterior communicating artery, the left is diminutive or absent. Bilateral PCA branches are within normal limits. Anterior circulation: Both ICA siphons are patent. On the left there is  mild supraclinoid calcified plaque without stenosis. On the right there is no significant plaque or stenosis. Normal right posterior communicating artery origin. Patent carotid termini. Normal MCA and ACA origins. Normal anterior communicating artery. Mildly dominant right ACA A2. Bilateral ACA branches are within normal limits. Left MCA M1 segment and bifurcation are patent without stenosis. Right MCA M1 segment and bifurcation are patent without stenosis. Bilateral MCA branches are stable to the recent CT V on 06/23/2023 within normal limits. Venous sinuses: Early contrast timing, grossly patent. Anatomic variants: Mildly dominant left vertebral artery in the neck. Mildly dominant right ACA A2. Review of the MIP images confirms the above findings IMPRESSION: CTA is negative; No large vessel occlusion. And minimal to mild for age atherosclerosis in the head and neck with no arterial stenosis. Electronically Signed   By: Odessa Fleming M.D.   On: 07/05/2023 07:36    Scheduled Meds:  acetaminophen  650 mg Oral Q6H   aspirin EC  81 mg Oral Daily   carbidopa-levodopa  3 tablet Oral 6 times per day   [START ON 07/06/2023] cholecalciferol  2,000 Units Oral Daily   clopidogrel  75 mg Oral Q breakfast   [START ON 07/06/2023] vitamin B-12  2,000 mcg Oral Daily   enoxaparin (LOVENOX) injection  40 mg Subcutaneous QHS   [START ON 07/06/2023] entacapone  200 mg Oral 3 times per day   furosemide  20 mg Oral BID   insulin aspart  0-15 Units Subcutaneous TID WC   insulin aspart  0-5 Units Subcutaneous QHS   insulin glargine-yfgn  8 Units Subcutaneous QHS   lidocaine  1 patch Transdermal Q24H   methenamine  500 mg Oral QID   methocarbamol  500 mg Oral Q6H   metoprolol succinate  25 mg Oral Daily   pantoprazole  40 mg Oral Daily   potassium chloride  10 mEq Oral BID   rOPINIRole  1.5 mg Oral q1800   [START ON 07/06/2023] rOPINIRole  3 mg Oral BID WC   rosuvastatin  40 mg Oral Daily   sacubitril-valsartan  1 tablet Oral  BID   sertraline  50 mg Oral Daily   tamsulosin  0.4 mg Oral Daily   Continuous Infusions:   LOS: 2 days   Marguerita Merles, DO [facility] Available via Epic secure chat 7am-7pm After these hours, please refer to coverage provider listed on [email or web address] 07/05/2023, 6:39 PM

## 2023-07-05 NOTE — Procedures (Addendum)
Patient Name: KACHE SEBESTYEN  MRN: [id number]  Epilepsy Attending: Charlsie Quest  Referring Physician/Provider: Merlene Laughter, DO  Date: 07/05/2023 Duration: 24.45 mins  Patient history: 77 yo M with ams getting eeg to evaluate for seizure.  Level of alertness: Awake  AEDs during EEG study: None  Technical aspects: This EEG study was done with scalp electrodes positioned according to the 10-20 International system of electrode placement. Electrical activity was reviewed with band pass filter of 1-70Hz , sensitivity of 7 uV/mm, display speed of 5mm/sec with a 60Hz  notched filter applied as appropriate. EEG data were recorded continuously and digitally stored.  Video monitoring was available and reviewed as appropriate.  Description: The posterior dominant rhythm consists of 8 Hz activity of moderate voltage (25-35 uV) seen predominantly in posterior head regions, symmetric and reactive to eye opening and eye closing.  Photic driving was not seen during photic stimulation. Hperventilation was not performed.     IMPRESSION: This study is within normal limits. No seizures or epileptiform discharges were seen throughout the recording.  A normal interictal EEG does not exclude the diagnosis of epilepsy.  Damier Disano Annabelle Harman

## 2023-07-05 NOTE — Plan of Care (Signed)

## 2023-07-05 NOTE — Consult Note (Signed)
[facility] Tele[facility]urology Consult Services   Patient Name:   [name], Force Date of Birth:   01-Nov-1945 Identification Number:   MRN - [id number] Date of Service:   07/05/2023 07:12:12  Diagnosis:       R46.4 - Slowness and poor responsiveness  Impression:      This is a 77 year old male with significant slowness and poor responsiveness which seem to be correlated with him being frozen.    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 He should receive his Sinemet ASAP if possible. It seems to be getting better. CT angiogram of the head and neck reviewed with no LVO or high-grade stenosis. Not a candidate for intervention because of this. Not a candidate for thrombolytics as last known well was most likely at 1 AM.    Recommend MRI brain without contrast and would give patient his Sinemet ASAP. Thank you for the consultation.  Our recommendations are outlined below.  Recommendations:        Stroke/Telemetry Floor       Neuro Checks       Bedside Swallow Eval       DVT Prophylaxis       IV Fluids, Normal Saline       Head of Bed 30 Degrees       Euglycemia and Avoid Hyperthermia (PRN Acetaminophen)  Sign Out:       Discussed with Emergency Department Provider    ------------------------------------------------------------------------------  Advanced Imaging: CTA Head and Neck Completed.  LVO:No  Patient in not a candidate for NIR   Metrics: Last Known Well: 07/05/2023 01:00:00 [facility] Notification Time: 07/05/2023 07:10:15 Stamp Time: 07/05/2023 07:12:12 Initial Response Time: 07/05/2023 07:16:11 Symptoms: Generalized Weakness and Aphasia. Initial patient interaction: 07/05/2023 07:18:20 NIHSS Assessment Completed: 07/05/2023 07:25:03 Patient is not a candidate for Thrombolytic. Thrombolytic Medical Decision: 07/05/2023 07:25:13 Patient was not deemed candidate for Thrombolytic because of following reasons: Last Well Known Above  4.5 Hours. He seemed to be frozen. Question Parkinson disease frozen state..  CT head showed no acute hemorrhage or acute core infarct.  Primary Provider Notified of Diagnostic Impression and Management Plan on: 07/05/2023 78:29:56 Spoke With: Anthoney Harada, NP Able to Reach 07/05/2023 07:22:22    ------------------------------------------------------------------------------  History of Present Illness: Patient is a 77 year old Male.  Inpatient stroke alert was called for symptoms of Generalized Weakness and Aphasia. This is a complicated 77 year old male with a past medical history significant for congestive heart failure and it looks like he is having a CHF exacerbation now, hypertension, hyperlipidemia, coronary disease as well as Parkinson's disease who presents to the hospital with fall with left-sided acute rib fracture and gross hematuria. He did not lose consciousness with CT of the head negative.  This morning at 1 AM he was talking completely normally. He was talking a bit but the nurse is not sure to what extent at around 5:50 AM. The patient however, stopped talking and stopped moving at that time. Stroke alert was called for further evaluation.   Past Medical History:      Hypertension      Diabetes Mellitus      Hyperlipidemia      Coronary Artery Disease  Medications:  No Anticoagulant use  Antiplatelet use: Yes ASA and Plavix Reviewed EMR for current medications  Allergies:  Reviewed  Social History: Drug Use: No  Family History:  There is no family history of premature cerebrovascular disease pertinent to this consultation  ROS : ROS  Cannot Be Obtained Because:  Patient Is Confused  Past Surgical History: There Is No Surgical History Contributory To Today's Visit    Examination: BP(164/75), Pulse(51), Blood Glucose(138) 1A: Level of Consciousness - Alert; keenly responsive + 0 1B: Ask Month and Age - Aphasic + 2 1C: Blink Eyes & Squeeze Hands  - Performs Both Tasks + 0 2: Test Horizontal Extraocular Movements - Normal + 0 3: Test Visual Fields - No Visual Loss + 0 4: Test Facial Palsy (Use Grimace if Obtunded) - Normal symmetry + 0 5A: Test Left Arm Motor Drift - Drift, but doesn't hit bed + 1 5B: Test Right Arm Motor Drift - Drift, but doesn't hit bed + 1 6A: Test Left Leg Motor Drift - Drift, but doesn't hit bed + 1 6B: Test Right Leg Motor Drift - Drift, but doesn't hit bed + 1 7: Test Limb Ataxia (FNF/Heel-Shin) - No Ataxia + 0 8: Test Sensation - Normal; No sensory loss + 0 9: Test Language/Aphasia - Mild-Moderate Aphasia: Some Obvious Changes, Without Significant Limitation + 1 10: Test Dysarthria - Normal + 0 11: Test Extinction/Inattention - No abnormality + 0  NIHSS Score: 7   Pre-Morbid Modified Rankin Scale: 3 Points = Moderate disability; requiring some help, but able to walk without assistance  Spoke with : Anthoney Harada, NP  This consult was conducted in real time using interactive audio and Immunologist. Patient was informed of the technology being used for this visit and agreed to proceed. Patient located in hospital and provider located at home/office setting.   Patient is being evaluated for possible acute neurologic impairment and high probability of imminent or life-threatening deterioration. I spent total of 35 minutes providing care to this patient, including time for face to face visit via telemedicine, review of medical records, imaging studies and discussion of findings with providers, the patient and/or family.   Dr Benay Spice   [facility] For Inpatient follow-up with [facility] physician please call [facility] [phone]. This is not an outpatient service. Post hospital discharge, please contact hospital directly.  Please do not communicate with [facility] physicians via secure chat. If you have any questions, Please contact [facility]. Please call or reconsult our service if  there are any clinical or diagnostic changes.

## 2023-07-05 NOTE — Progress Notes (Addendum)
Patient [name]           DOB: 21-Oct-1945  MRN: [id number]      Admission Date: 07/03/2023  Attending Provider: Merlene Laughter, DO  Primary Diagnosis: Gross hematuria   Level of care: Telemetry    CROSS COVER NOTE   Date of Service   07/05/2023   [name], 77 y.o. male, was admitted on 07/03/2023 for Gross hematuria.    HPI/Events of Note   Notified by RN of Code Stroke being activated.   34- LKW ??- Not seen by RN, but by CNA who states that pt was verbal.  [phone]- RN notices neuro changes, mainly nonverbal and not following commands.  5621- RRT was called to bedside 0655- At bedside with neuro tele.  Patient is nonverbal and awake. Pupils minimally responsive to light, facial symmetry noted with flat affect. W/D to pain, unable to move all extremities. glucose 138. Currently on ASA and plavix.   0726-CT head negative. CTA negative, no LVO  Patient is becoming more interactive and able to follow commands.  Slowly improving, but overall weak. Still aphasic, but is attempting to communicate.  He whispered, but was not comprehended. Diffuse weakness in all 4 extremities.  Right arm drifts more than left. He is able to nod when asked if facial and BUE sensation is symmetrical.  Dr Geryl Councilman (neurologist) recommending MRI brain.  Also recommends providing Sinemet medication as soon as able, questioning if "frozen" condition could be related to Parkinson's.    Interventions/ Plan   CT head CT angio head with or without contrast MRI brain        Anthoney Harada, DNP, ACNPC- [facility]

## 2023-07-05 NOTE — Progress Notes (Signed)
1610 Code Stroke cart activated, provider Ruffin Pyo at bedside. LKW 0550, ROS 0645 pt noted to be unresponsive, when he finally came around, he was aphasic and not moving extremities. H/O parkinsons, CHF, CAD s/p stent placement 3/24, DM, HTN, HLD. Pt was admitted 9/18 after fall in shower with L rib fracture and hematuria.NCCT on the 18th was negative.BP 164/75 HR 51, glucose 138. Pt on ASA and plavix 0702 pt to CT, telestroke cart taken with pt to CT 0714 TS called/paged 0717 Dr Ammie Ferrier on camera 0719 pt examined in CT, images reviewed, no LVO per Dr Ammie Ferrier, will obtain MRI. LKW changed to 0100 as bedside RN states she was unsure if pt was speaking in complete sentences at 0550 per Dr Ammie Ferrier

## 2023-07-05 NOTE — Significant Event (Signed)
Rapid Response Event Note   Reason for Call :  Unresponsive-last known well 0548. Bedside nurse entered room to administer am meds at 0645 and called rapid response.  Initial Focused Assessment:  Bruising over left eye/face from fall PTA. Patient's eyes open with pinpoint pupils making it difficult to assess pupillary response. Patient did blink eyes to threat. Patient unable to move mouth but did see bilateral muscle movement in neck like he was trying to open his mouth. Slight attempts bilaterally to grip my hands. All four extremities dropped to bed without ability to hold in place. Withdraws feet bilaterally to stimuli. No labored breathing or evidence of pain/distress. No recent sedatives or opiates given.  Known history of parkinson's disease. Reportedly was speaking, swallowing, oriented x 4, and following commands overnight. Last known well at [phone] with nurse tech who obtained vitals. Documented 0656 vitals: 97.9 F, BP 164/75 MAP 190, HR 51. CBG 138.  Interventions:  Called CODE STROKE. Transported to CT. Tele Neuro Cart activated. Overnight hospital provider at bedside. Assisted tele neurologist in assessment after CT completed. Transported to MRI. Handed off to oncoming rapid response nurse.  Plan of Care:  Physician to determine cause of symptoms and follow up on diagnostics. TBD if change in level of care.  Event Summary:   MD Notified: Anthoney Harada, NP Call Time: [phone] Arrival Time: [phone] End Time: 6237  Lamona Curl, RN

## 2023-07-06 DIAGNOSIS — W19XXXA Unspecified fall, initial encounter: Secondary | ICD-10-CM | POA: Diagnosis not present

## 2023-07-06 DIAGNOSIS — S2232XA Fracture of one rib, left side, initial encounter for closed fracture: Secondary | ICD-10-CM | POA: Diagnosis not present

## 2023-07-06 DIAGNOSIS — R310 Gross hematuria: Secondary | ICD-10-CM | POA: Diagnosis not present

## 2023-07-06 DIAGNOSIS — Y92009 Unspecified place in unspecified non-institutional (private) residence as the place of occurrence of the external cause: Secondary | ICD-10-CM

## 2023-07-06 DIAGNOSIS — G20A1 Parkinson's disease without dyskinesia, without mention of fluctuations: Secondary | ICD-10-CM

## 2023-07-06 LAB — CBC WITH DIFFERENTIAL/PLATELET
Abs Immature Granulocytes: 0.01 10*3/uL (ref 0.00–0.07)
Basophils Absolute: 0 10*3/uL (ref 0.0–0.1)
Basophils Relative: 1 %
Eosinophils Absolute: 0.1 10*3/uL (ref 0.0–0.5)
Eosinophils Relative: 2 %
HCT: 45.8 % (ref 39.0–52.0)
Hemoglobin: 14.3 g/dL (ref 13.0–17.0)
Immature Granulocytes: 0 %
Lymphocytes Relative: 15 %
Lymphs Abs: 0.9 10*3/uL (ref 0.7–4.0)
MCH: 29.4 pg (ref 26.0–34.0)
MCHC: 31.2 g/dL (ref 30.0–36.0)
MCV: 94.2 fL (ref 80.0–100.0)
Monocytes Absolute: 0.3 10*3/uL (ref 0.1–1.0)
Monocytes Relative: 5 %
Neutro Abs: 4.8 10*3/uL (ref 1.7–7.7)
Neutrophils Relative %: 77 %
Platelets: 236 10*3/uL (ref 150–400)
RBC: 4.86 MIL/uL (ref 4.22–5.81)
RDW: 13.1 % (ref 11.5–15.5)
WBC: 6.2 10*3/uL (ref 4.0–10.5)
nRBC: 0 % (ref 0.0–0.2)

## 2023-07-06 LAB — COMPREHENSIVE METABOLIC PANEL
ALT: 7 U/L (ref 0–44)
AST: 6 U/L — ABNORMAL LOW (ref 15–41)
Albumin: 3.3 g/dL — ABNORMAL LOW (ref 3.5–5.0)
Alkaline Phosphatase: 72 U/L (ref 38–126)
Anion gap: 11 (ref 5–15)
BUN: 26 mg/dL — ABNORMAL HIGH (ref 8–23)
CO2: 27 mmol/L (ref 22–32)
Calcium: 8.9 mg/dL (ref 8.9–10.3)
Chloride: 102 mmol/L (ref 98–111)
Creatinine, Ser: 1.4 mg/dL — ABNORMAL HIGH (ref 0.61–1.24)
GFR, Estimated: 52 mL/min — ABNORMAL LOW (ref >60)
Glucose, Bld: 270 mg/dL — ABNORMAL HIGH (ref 70–99)
Potassium: 4.1 mmol/L (ref 3.5–5.1)
Sodium: 140 mmol/L (ref 135–145)
Total Bilirubin: 0.6 mg/dL (ref 0.3–1.2)
Total Protein: 6.2 g/dL — ABNORMAL LOW (ref 6.5–8.1)

## 2023-07-06 LAB — MAGNESIUM: Magnesium: 2.1 mg/dL (ref 1.7–2.4)

## 2023-07-06 LAB — GLUCOSE, CAPILLARY
Glucose-Capillary: 254 mg/dL — ABNORMAL HIGH (ref 70–99)
Glucose-Capillary: 125 mg/dL — ABNORMAL HIGH (ref 70–99)
Glucose-Capillary: 215 mg/dL — ABNORMAL HIGH (ref 70–99)
Glucose-Capillary: 179 mg/dL — ABNORMAL HIGH (ref 70–99)

## 2023-07-06 LAB — PHOSPHORUS: Phosphorus: 3 mg/dL (ref 2.5–4.6)

## 2023-07-06 MED ORDER — SODIUM CHLORIDE 0.9 % IV SOLN: INTRAVENOUS | Status: DC

## 2023-07-06 MED ORDER — SODIUM CHLORIDE 0.9 % IV SOLN: INTRAVENOUS | Status: AC

## 2023-07-06 NOTE — Progress Notes (Signed)
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  He did not lose consciousness and does not think he hit his head but did have some left-sided chest pain.  Given his symptoms he decided to come to the hospital and he was noted to have some gross hematuria though started clearing up.  Lab works showed an increased creatinine from his normal baseline.  Urinalysis completed and showed evidence of microscopic hematuria well.  He underwent a CT head which was unremarkable as well as CT of the chest abdomen pelvis which showed an acute left sixth rib fracture and other prior well-healed rib fractures.  He was seen by general surgery and urology and PT OT recommended SNF.  His gross hematuria is improved.  This morning he had a period of unresponsiveness and a stroke alert was called.  Stroke was ruled out and this felt that his decreased responsiveness was in the setting of his Parkinson's and it was felt that he had "frozen" episode.  His Sinemet has been resumed.  Neurology recommended obtaining EEG for completion but no further workup was needed.  They recommended that the patient receive a Sinemet scheduled.  Patient is now deemed medically stable and awaiting SNF placement and insurance authorization.  Assessment and Plan:  CORDERRA VONSTEIN is a 77  y.o. male with medical history significant for heart failure with reduced EF, recent hospital stay for suspected ESBL UTI, CAD status post coronary stent March 2024 on aspirin and Plavix, insulin-dependent type 2 diabetes, hypertension, Parkinson's disease, hyperlipidemia, GERD being admitted to the hospital with left-sided acute rib fracture and gross hematuria after a mechanical fall in the shower day before last.    Mechanical Fall with Left Sixth Rib Fracture and Generalized Weakness -With minimal displacement, no associated hematoma or pneumothorax. -Inpatient admission -Pain control with tramadol, lidocaine patch, IV Dilaudid for severe pain -Appreciate trauma surgery consultation, they will follow -Order Incentive Spirometry and pulmonary toileting -PT OT recommending SNF and selected an once form and bed availability will be on Monday.   Decreased Responsiveness and Aphasia, improved -In the setting of Parkinson's and likely "Frozen" -Stroke Ruled Out given MRI Negative for CVA -CT Head done and showed "Stable noncontrast CT appearance of the brain from two days ago, negative for age. ASPECTS 10." -CTA Head and Neck done and showed "CTA is negative; No large vessel occlusion. And minimal to mild for age atherosclerosis in the head and neck with no arterial stenosis." -MRI Brain one and showed "No acute intracranial abnormality. 2. Mild for age signal changes in the cerebral white matter and a solitary chronic microhemorrhage in the right midbrain most compatible with chronic small vessel disease." -SLP evaluated and feel he is a mild aspiration risk and recommending Regular Diet and Thin Liquid -Ensure patient gets Sinemet as scheduled and resume Entacapone -Check EEG and done and showed "This study is within  normal limits. No seizures or epileptiform discharges were seen throughout the recording.  A normal interictal EEG does not exclude the diagnosis of epilepsy." -Patient appears  baseline now; Giving IVF as below short-term but he is very medically stable  Gross Hematuria -After recent trauma, patient is urinating well without current evidence of retention.  Family states urine seems to be clearing up and less bloody. -Patient is hemodynamically stable without anemia -Discussed with Dr. Laverle Patter of urology, who recommends no Foley catheter, okay to continue antiplatelet therapy for now -Dr. Laverle Patter recommending outpatient follow up -Foley and CBI in case of retention   Heart Failure with reduced EF -Patient currently appears euvolemic -Continue home dose Lasix for now but will start timed Fluids short term -Strict I's and O's and Daily Weights  Intake/Output Summary (Last 24 hours) at 07/06/2023 1427 Last data filed at 07/06/2023 0900 Gross per 24 hour  Intake 120 ml  Output 1900 ml  Net -1780 ml  -Continue Sacubitril-Valsartan 24-26 mg 1 tab po BID and Metoprolol Succinate 25 mg po daily  -Continue to monitor for signs and symptoms of volume overload   Insulin-Dependent Type 2 Diabetes -Poorly controlled with hemoglobin A1c 11% in June 2024 -Carb controlled diet when eating -Reduced dose Lantus nightly to 8 units -C/w Moderate Novolog Sliding Scale Insulin  -CBG and Glucose Trend: Recent Labs  Lab 07/05/23 0648 07/05/23 0842 07/05/23 1202 07/05/23 1706 07/05/23 2041 07/06/23 0725 07/06/23 1132  GLUCAP 138* 128* 203* 134* 197* 254* 125*   Recent Labs  Lab 06/23/23 1843 06/24/23 0604 06/25/23 4098 07/03/23 1126 07/04/23 0612 07/05/23 0554 07/06/23 0632  GLUCOSE 181* 202* 121* 163* 185* 134* 270*    Parkinson's Disease -Continue Sinemet as scheduled and now resume Entacapone 200 mg po TID -Outpatient follow up with Neurology Dr. Arbutus Leas at discharge   CAD -Status post PCI in March -Continue Aspirin and Plavix and Rosuvastatin -Resume Metoprolol Succinate 25 mg po Daily  -May have to consider holding antiplatelets in case of severe bleeding  however improving Hematuria and resolution    CKD Stage 3a -Initially thought to be an AKI but likely has CKD stage IIIa and was mild, possibly due to mild dehydration since his fall, or intermittent urinary retention due to his gross hematuria.   -BUN/Cr Trend: Recent Labs  Lab 06/23/23 1843 06/24/23 0135 06/24/23 0604 06/25/23 0608 07/03/23 1126 07/04/23 0612 07/05/23 0554 07/06/23 0632  BUN 20  --  25* 15 33* 25* 24* 26*  CREATININE 1.35* 1.30* 1.41* 1.13 1.27* 1.35* 0.99 1.40*  -Start NS at 75 mL/hr today given slight Renal Bump from yesterday but only short term given Hx of Heart Failure (12 hours) -Avoid Nephrotoxic Medications, Contrast Dyes, Hypotension and Dehydration to Ensure Adequate Renal Perfusion and will need to Renally Adjust Meds -Continue to Monitor and Trend Renal Function carefully and repeat CMP in the AM   Subconjunctival Hemorrhage -Likely due to Plavix use, followed by Dr. Dione Booze -Continue supportive care and monitoring    Depression -C/w Sertraline 50 mg Daily   GERD/GI Prophylaxis -C/w Pantoprazole 40 mg po Daily   Hypoalbuminemia -Patient's Albumin Trend: Recent Labs  Lab 06/25/23 0608 07/03/23 1126 07/05/23 0554 07/06/23 0632  ALBUMIN 3.1* 3.7 3.2* 3.3*  -Continue to Monitor and Trend and repeat CMP in the AM  Overweight -Complicates overall prognosis and care -Estimated body mass index is 29.27 kg/m as calculated from the following:   Height as of this encounter: 5\' 8"  (1.727 m).   Weight  as of this encounter: 87.3 kg.  -Weight Loss and Dietary Counseling given   DVT prophylaxis: enoxaparin (LOVENOX) injection 40 mg Start: 07/05/23 2200 SCDs Start: 07/03/23 1714    Code Status: Limited: Do not attempt resuscitation (DNR) -DNR-LIMITED -Do Not Intubate/DNI  Family Communication: Discussed with the patient's wife at bedside  Disposition Plan:  Level of care: Telemetry Status is: Inpatient Remains inpatient appropriate because: He is  medically stable for discharge however needs insurance authorization and bed availability for Monday   Consultants:  Urology Discussed the case with neurology  Procedures:  As delineated as above  Antimicrobials:  Anti-infectives (From admission, onward)    Start     Dose/Rate Route Frequency Ordered Stop   07/05/23 1800  methenamine (MANDELAMINE) tablet 500 mg        500 mg Oral 4 times daily 07/05/23 1524         Subjective: Seen and examined at bedside and thinks he is doing fairly well.  States that he did not sleep last night but he states that he normally does not sleep.  No nausea or vomiting.  Denies any lightheadedness or dizziness.  No other concerns or complaints at this time.  Objective: Vitals:   07/05/23 1241 07/05/23 1934 07/06/23 0454 07/06/23 1353  BP: (!) 114/55 102/65 (!) 148/80 131/74  Pulse: 71 (!) 52 (!) 55 60  Resp:  15 18 18   Temp: 98.4 F (36.9 C) 98.6 F (37 C) 98.8 F (37.1 C) 98.2 F (36.8 C)  TempSrc:  Oral Oral   SpO2: 91% 96% 97% 95%  Weight:      Height:        Intake/Output Summary (Last 24 hours) at 07/06/2023 1432 Last data filed at 07/06/2023 0900 Gross per 24 hour  Intake 120 ml  Output 1900 ml  Net -1780 ml   Filed Weights   07/03/23 1043  Weight: 87.3 kg   Examination: Physical Exam:  Constitutional: Caucasian male in no acute distress Respiratory: Diminished to auscultation bilaterally, no wheezing, rales, rhonchi or crackles. Normal respiratory effort and patient is not tachypenic. No accessory muscle use.  Unlabored breathing Cardiovascular: RRR, no murmurs / rubs / gallops. S1 and S2 auscultated.  Trace extremity edema Abdomen: Soft, non-tender, slightly distended secondary body habitus. Bowel sounds positive.  GU: Deferred. Musculoskeletal: No clubbing / cyanosis of digits/nails. No joint deformity upper and lower extremities. Skin: No rashes, lesions, ulcers on a limited skin evaluation. No induration; Warm and dry.   Neurologic: CN 2-12 grossly intact with no focal deficits. Romberg sign and cerebellar reflexes not assessed.  Psychiatric: Normal judgment and insight. Alert and oriented x 3. Normal mood and appropriate affect.   Data Reviewed: I have personally reviewed following labs and imaging studies  CBC: Recent Labs  Lab 07/03/23 1126 07/04/23 0612 07/05/23 0554 07/06/23 0632  WBC 7.1 5.1 6.2 6.2  NEUTROABS 5.2  --  4.5 4.8  HGB 14.3 13.4 14.6 14.3  HCT 45.5 42.7 48.0 45.8  MCV 93.8 93.8 96.4 94.2  PLT 244 212 215 236   Basic Metabolic Panel: Recent Labs  Lab 07/03/23 1126 07/04/23 0612 07/05/23 0554 07/06/23 0632  NA 140 138 139 140  K 4.3 4.4 3.5 4.1  CL 104 103 105 102  CO2 25 29 24 27   GLUCOSE 163* 185* 134* 270*  BUN 33* 25* 24* 26*  CREATININE 1.27* 1.35* 0.99 1.40*  CALCIUM 9.2 8.7* 8.7* 8.9  MG  --   --  2.4 2.1  PHOS  --   --  3.2 3.0   GFR: Estimated Creatinine Clearance: 48.3 mL/min (A) (by C-G formula based on SCr of 1.4 mg/dL (H)). Liver Function Tests: Recent Labs  Lab 07/03/23 1126 07-29-2023 0554 07/06/23 0632  AST 11* 10* 6*  ALT <5 <5 7  ALKPHOS 79 69 72  BILITOT 0.7 0.8 0.6  PROT 6.5 6.0* 6.2*  ALBUMIN 3.7 3.2* 3.3*   No results for input(s): "LIPASE", "AMYLASE" in the last 168 hours. No results for input(s): "AMMONIA" in the last 168 hours. Coagulation Profile: No results for input(s): "INR", "PROTIME" in the last 168 hours. Cardiac Enzymes: No results for input(s): "CKTOTAL", "CKMB", "CKMBINDEX", "TROPONINI" in the last 168 hours. BNP (last 3 results) No results for input(s): "PROBNP" in the last 8760 hours. HbA1C: No results for input(s): "HGBA1C" in the last 72 hours. CBG: Recent Labs  Lab July 29, 2023 1202 07/29/2023 1706 2023/07/29 2041 07/06/23 0725 07/06/23 1132  GLUCAP 203* 134* 197* 254* 125*   Lipid Profile: No results for input(s): "CHOL", "HDL", "LDLCALC", "TRIG", "CHOLHDL", "LDLDIRECT" in the last 72 hours. Thyroid Function  Tests: No results for input(s): "TSH", "T4TOTAL", "FREET4", "T3FREE", "THYROIDAB" in the last 72 hours. Anemia Panel: No results for input(s): "VITAMINB12", "FOLATE", "FERRITIN", "TIBC", "IRON", "RETICCTPCT" in the last 72 hours. Sepsis Labs: No results for input(s): "PROCALCITON", "LATICACIDVEN" in the last 168 hours.  No results found for this or any previous visit (from the past 240 hour(s)).   Radiology Studies: EEG adult  Result Date: Jul 29, 2023 Charlsie Quest, MD     07/29/2023  4:15 PM Patient Name: THORNE SIEGENTHALER MRN: [id number] Epilepsy Attending: Charlsie Quest Referring Physician/Provider: Merlene Laughter, DO Date: Jul 29, 2023 Duration: 24.45 mins Patient history: 77 yo M with ams getting eeg to evaluate for seizure. Level of alertness: Awake AEDs during EEG study: None Technical aspects: This EEG study was done with scalp electrodes positioned according to the 10-20 International system of electrode placement. Electrical activity was reviewed with band pass filter of 1-70Hz , sensitivity of 7 uV/mm, display speed of 49mm/sec with a 60Hz  notched filter applied as appropriate. EEG data were recorded continuously and digitally stored.  Video monitoring was available and reviewed as appropriate. Description: The posterior dominant rhythm consists of 8 Hz activity of moderate voltage (25-35 uV) seen predominantly in posterior head regions, symmetric and reactive to eye opening and eye closing.  Photic driving was not seen during photic stimulation. Hperventilation was not performed.   IMPRESSION: This study is within normal limits. No seizures or epileptiform discharges were seen throughout the recording. A normal interictal EEG does not exclude the diagnosis of epilepsy. Charlsie Quest   MR BRAIN WO CONTRAST  Result Date: 29-Jul-2023 CLINICAL DATA:  77 year old male code stroke presentation this morning. Nonverbal. EXAM: MRI HEAD WITHOUT CONTRAST TECHNIQUE: Multiplanar, multiecho pulse  sequences of the brain and surrounding structures were obtained without intravenous contrast. COMPARISON:  Head CT and CTA head and neck this morning. Previous brain MRI 10/03/2009. FINDINGS: Brain: No restricted diffusion to suggest acute infarction. No midline shift, mass effect, evidence of mass lesion, ventriculomegaly, extra-axial collection or acute intracranial hemorrhage. Cervicomedullary junction and pituitary are within normal limits. Cerebral volume is only mildly diminished in a generalized fashion since the 2010 MRI. Mild ex vacuo appearing ventricular enlargement. Scattered mild for age cerebral white matter T2 and FLAIR hyperintensity has progressed since 2010. The configuration is nonspecific. No cortical encephalomalacia identified. There is a small chronic microhemorrhage in the right  midbrain which was less conspicuous on T2 * imaging in 2010 (series 10, image 25 today). No other chronic cerebral blood products identified. Otherwise negative deep gray nuclei, brainstem and cerebellum for age. Vascular: Major intracranial vascular flow voids are stable since 2010. Skull and upper cervical spine: Negative visible cervical spine. Visualized bone marrow signal is within normal limits. Sinuses/Orbits: Postoperative changes to both globes since 2010. Paranasal Visualized paranasal sinuses and mastoids are stable and well aerated. Other: Visible internal auditory structures appear normal. Negative visible scalp and face. IMPRESSION: 1. No acute intracranial abnormality. 2. Mild for age signal changes in the cerebral white matter and a solitary chronic microhemorrhage in the right midbrain most compatible with chronic small vessel disease. Electronically Signed   By: Odessa Fleming M.D.   On: 07/05/2023 08:38   CT HEAD CODE STROKE WO CONTRAST  Addendum Date: 07/05/2023   ADDENDUM REPORT: 07/05/2023 07:50 ADDENDUM: Study discussed by telephone with NP ABIGAIL CHAVEZ on 07/05/2023 at 0729 hours. Electronically  Signed   By: Odessa Fleming M.D.   On: 07/05/2023 07:50   Result Date: 07/05/2023 CLINICAL DATA:  Code stroke. 77 year old male. Unresponsive. Last known well 0548 hours. EXAM: CT HEAD WITHOUT CONTRAST TECHNIQUE: Contiguous axial images were obtained from the base of the skull through the vertex without intravenous contrast. RADIATION DOSE REDUCTION: This exam was performed according to the departmental dose-optimization program which includes automated exposure control, adjustment of the mA and/or kV according to patient size and/or use of iterative reconstruction technique. COMPARISON:  Brain MRI 10/03/2009.  Recent head CT 10/02/2023. FINDINGS: Brain: No midline shift, mass effect, or evidence of intracranial mass lesion. No acute intracranial hemorrhage identified. Stable cerebral volume. Stable ventricular size without convincing acute ventriculomegaly. There is mild chronic nonspecific lateral ventricular prominence. No transependymal edema. Gray-white differentiation is stable from 2 days ago and within normal limits for age. No encephalomalacia identified. Vascular: There is some residual intravascular contrast suspected, probably from CT Chest, Abdomen, and Pelvis 2 days ago. No suspicious intracranial vascular hyperdensity. Mild Calcified atherosclerosis at the skull base. Skull: Stable and intact. Sinuses/Orbits: Visualized paranasal sinuses and mastoids are stable and well aerated. Other: No gaze deviation. No acute orbit or scalp soft tissue finding. ASPECTS [facility] Stroke Program Early CT Score) Total score (0-10 with 10 being normal): 10 IMPRESSION: Stable noncontrast CT appearance of the brain from two days ago, negative for age. ASPECTS 10. Electronically Signed: By: Odessa Fleming M.D. On: 07/05/2023 07:24   CT ANGIO HEAD NECK W WO CM (CODE STROKE)  Result Date: 07/05/2023 CLINICAL DATA:  77 year old male.  Recent fall.  Now nonverbal. EXAM: CT ANGIOGRAPHY HEAD AND NECK WITH CONTRAST TECHNIQUE:  Multidetector CT imaging of the head and neck was performed using the standard protocol during bolus administration of intravenous contrast. Multiplanar CT image reconstructions and MIPs were obtained to evaluate the vascular anatomy. Carotid stenosis measurements (when applicable) are obtained utilizing NASCET criteria, using the distal internal carotid diameter as the denominator. RADIATION DOSE REDUCTION: This exam was performed according to the departmental dose-optimization program which includes automated exposure control, adjustment of the mA and/or kV according to patient size and/or use of iterative reconstruction technique. CONTRAST:  75mL OMNIPAQUE IOHEXOL 350 MG/ML SOLN COMPARISON:  Repeat plain head CT this morning 0711 hours. Recent CT venogram 06/23/2023. CT Chest, Abdomen, and Pelvis today are reported separately. 07/03/2023. FINDINGS: CTA NECK Skeleton: Stable. Mild T2 superior endplate compression appears to be chronic. Degenerative endplate changes C6-C7. No acute osseous  abnormality identified. Upper chest: Lower lung volumes with atelectasis.  Otherwise stable. Other neck: No acute finding. Aortic arch: Calcified aortic atherosclerosis. 3 vessel arch. Right carotid system: Minimal brachiocephalic artery plaque without stenosis. Tortuous right CCA origin. No significant right CCA, right carotid or cervical right ICA plaque or stenosis. Mildly tortuous right ICA to the skull base. Left carotid system: Tortuosity with no significant plaque or stenosis. Vertebral arteries: Mildly tortuous right subclavian artery origin and mild soft and calcified plaque there but no stenosis. Normal right vertebral artery origin. Tortuous and mildly non dominant right vertebral artery is otherwise patent and normal to the skull base. Minimal proximal left subclavian artery plaque and tortuosity at the left thoracic inlet but no stenosis. Left vertebral artery origin is normal. Mildly dominant and tortuous left  vertebral artery is patent to the skull base with no plaque or stenosis. CTA HEAD Posterior circulation: Codominant distal vertebral arteries and normal vertebrobasilar junction. No significant plaque or stenosis. Normal left PICA origin. Right AICA appears to be dominant. Patent basilar artery without stenosis. Normal SCA and PCA origins. Small right posterior communicating artery, the left is diminutive or absent. Bilateral PCA branches are within normal limits. Anterior circulation: Both ICA siphons are patent. On the left there is mild supraclinoid calcified plaque without stenosis. On the right there is no significant plaque or stenosis. Normal right posterior communicating artery origin. Patent carotid termini. Normal MCA and ACA origins. Normal anterior communicating artery. Mildly dominant right ACA A2. Bilateral ACA branches are within normal limits. Left MCA M1 segment and bifurcation are patent without stenosis. Right MCA M1 segment and bifurcation are patent without stenosis. Bilateral MCA branches are stable to the recent CT V on 06/23/2023 within normal limits. Venous sinuses: Early contrast timing, grossly patent. Anatomic variants: Mildly dominant left vertebral artery in the neck. Mildly dominant right ACA A2. Review of the MIP images confirms the above findings IMPRESSION: CTA is negative; No large vessel occlusion. And minimal to mild for age atherosclerosis in the head and neck with no arterial stenosis. Electronically Signed   By: Odessa Fleming M.D.   On: 07/05/2023 07:36    Scheduled Meds:  acetaminophen  650 mg Oral Q6H   aspirin EC  81 mg Oral Daily   carbidopa-levodopa  3 tablet Oral 6 times per day   cholecalciferol  2,000 Units Oral Daily   clopidogrel  75 mg Oral Q breakfast   vitamin B-12  2,000 mcg Oral Daily   enoxaparin (LOVENOX) injection  40 mg Subcutaneous QHS   entacapone  200 mg Oral 3 times per day   furosemide  20 mg Oral BID   insulin aspart  0-15 Units Subcutaneous TID  WC   insulin aspart  0-5 Units Subcutaneous QHS   insulin glargine-yfgn  8 Units Subcutaneous QHS   lidocaine  1 patch Transdermal Q24H   methenamine  500 mg Oral QID   methocarbamol  500 mg Oral Q6H   metoprolol succinate  25 mg Oral Daily   pantoprazole  40 mg Oral Daily   potassium chloride  10 mEq Oral BID   rOPINIRole  1.5 mg Oral q1800   rOPINIRole  3 mg Oral BID WC   rosuvastatin  40 mg Oral Daily   sacubitril-valsartan  1 tablet Oral BID   sertraline  50 mg Oral Daily   tamsulosin  0.4 mg Oral Daily   Continuous Infusions:  sodium chloride      LOS: 3 days   [employer]  Marland Mcalpine, DO [facility] Available via Epic secure chat 7am-7pm After these hours, please refer to coverage provider listed on [email or web address] 07/06/2023, 2:32 PM

## 2023-07-06 NOTE — Plan of Care (Signed)

## 2023-07-07 DIAGNOSIS — R310 Gross hematuria: Secondary | ICD-10-CM | POA: Diagnosis not present

## 2023-07-07 DIAGNOSIS — S2232XA Fracture of one rib, left side, initial encounter for closed fracture: Secondary | ICD-10-CM | POA: Diagnosis not present

## 2023-07-07 DIAGNOSIS — G20A1 Parkinson's disease without dyskinesia, without mention of fluctuations: Secondary | ICD-10-CM | POA: Diagnosis not present

## 2023-07-07 DIAGNOSIS — W19XXXA Unspecified fall, initial encounter: Secondary | ICD-10-CM | POA: Diagnosis not present

## 2023-07-07 LAB — COMPREHENSIVE METABOLIC PANEL
ALT: 6 U/L (ref 0–44)
AST: <5 U/L — ABNORMAL LOW (ref 15–41)
Albumin: 3.2 g/dL — ABNORMAL LOW (ref 3.5–5.0)
Alkaline Phosphatase: 84 U/L (ref 38–126)
Anion gap: 7 (ref 5–15)
BUN: 20 mg/dL (ref 8–23)
CO2: 29 mmol/L (ref 22–32)
Calcium: 8.8 mg/dL — ABNORMAL LOW (ref 8.9–10.3)
Chloride: 103 mmol/L (ref 98–111)
Creatinine, Ser: 1.07 mg/dL (ref 0.61–1.24)
GFR, Estimated: >60 mL/min (ref >60)
Glucose, Bld: 198 mg/dL — ABNORMAL HIGH (ref 70–99)
Potassium: 4 mmol/L (ref 3.5–5.1)
Sodium: 139 mmol/L (ref 135–145)
Total Bilirubin: 0.7 mg/dL (ref 0.3–1.2)
Total Protein: 6.2 g/dL — ABNORMAL LOW (ref 6.5–8.1)

## 2023-07-07 LAB — CBC WITH DIFFERENTIAL/PLATELET
Abs Immature Granulocytes: 0.01 10*3/uL (ref 0.00–0.07)
Basophils Absolute: 0 10*3/uL (ref 0.0–0.1)
Basophils Relative: 1 %
Eosinophils Absolute: 0.1 10*3/uL (ref 0.0–0.5)
Eosinophils Relative: 3 %
HCT: 42.9 % (ref 39.0–52.0)
Hemoglobin: 13.8 g/dL (ref 13.0–17.0)
Immature Granulocytes: 0 %
Lymphocytes Relative: 15 %
Lymphs Abs: 0.8 10*3/uL (ref 0.7–4.0)
MCH: 29.9 pg (ref 26.0–34.0)
MCHC: 32.2 g/dL (ref 30.0–36.0)
MCV: 93.1 fL (ref 80.0–100.0)
Monocytes Absolute: 0.4 10*3/uL (ref 0.1–1.0)
Monocytes Relative: 7 %
Neutro Abs: 3.7 10*3/uL (ref 1.7–7.7)
Neutrophils Relative %: 74 %
Platelets: 216 10*3/uL (ref 150–400)
RBC: 4.61 MIL/uL (ref 4.22–5.81)
RDW: 12.9 % (ref 11.5–15.5)
WBC: 5 10*3/uL (ref 4.0–10.5)
nRBC: 0 % (ref 0.0–0.2)

## 2023-07-07 LAB — PHOSPHORUS: Phosphorus: 2.4 mg/dL — ABNORMAL LOW (ref 2.5–4.6)

## 2023-07-07 LAB — GLUCOSE, CAPILLARY
Glucose-Capillary: 205 mg/dL — ABNORMAL HIGH (ref 70–99)
Glucose-Capillary: 164 mg/dL — ABNORMAL HIGH (ref 70–99)
Glucose-Capillary: 159 mg/dL — ABNORMAL HIGH (ref 70–99)
Glucose-Capillary: 165 mg/dL — ABNORMAL HIGH (ref 70–99)

## 2023-07-07 LAB — MAGNESIUM: Magnesium: 2.2 mg/dL (ref 1.7–2.4)

## 2023-07-07 MED ORDER — K PHOS MONO-SOD PHOS DI & MONO 155-852-130 MG PO TABS
500.0000 mg | ORAL_TABLET | Freq: Once | ORAL | Status: AC
  Administered 2023-07-07: 500 mg via ORAL
  Filled 2023-07-07: qty 2

## 2023-07-07 NOTE — Progress Notes (Signed)
PT Cancellation Note  Patient Details [name] MRN: [id number] DOB: 1946/04/25   Cancelled Treatment:    Reason Eval/Treat Not Completed: Pain limiting ability to participate  Pt declined participating today due to rib pain with movement.  Pt's RN reports he has been ambulating to/from bathroom with staff today.  Will check back as schedule permits.    Janan Halter [place] 07/07/2023, 3:21 PM Paulino Door, DPT Physical Therapist Acute Rehabilitation Services Office: [phone]

## 2023-07-07 NOTE — TOC Progression Note (Signed)
Transition of Care [facility]) - Progression Note    Patient Details  [name] MRN: [id number] Date of Birth: Jul 22, 1946  Transition [facility] Care [facility]) CM/SW Contact  Darleene Cleaver, [place] Phone Number: 07/07/2023, 10:00 AM  Clinical Narrative:     Patient has been approved for SNF placement, authorization number is [id number], next review 07/09/2023.  Per [facility], they can not accept patient until Monday.   Expected Discharge Plan: Skilled Nursing Facility Barriers to Discharge: Continued Medical Work up, SNF Pending bed offer  Expected Discharge Plan and Services In-house Referral: Clinical Social Work Discharge Planning Services: NA Post Acute Care Choice: Skilled Nursing Facility Living arrangements for the past 2 months: Single Family Home                 DME Arranged: N/A DME Agency: NA                   Social Determinants of Health (SDOH) Interventions SDOH Screenings   Food Insecurity: No Food Insecurity (07/04/2023)  Housing: Low Risk  (07/04/2023)  Recent Concern: Housing - Medium Risk (06/24/2023)  Transportation Needs: No Transportation Needs (07/04/2023)  Utilities: Not At Risk (07/04/2023)  Depression (PHQ2-9): Low Risk  (05/29/2022)  Tobacco Use: Low Risk  (07/03/2023)    Readmission Risk Interventions    07/05/2023   12:50 PM 07/04/2023    2:29 PM 06/25/2023   12:33 PM  Readmission Risk Prevention Plan  Transportation Screening Complete Complete Complete  PCP or Specialist Appt within 5-7 Days Complete Complete Complete  Home Care Screening Complete Complete Complete  Medication Review (RN CM) Complete Complete Referral to Pharmacy

## 2023-07-07 NOTE — Progress Notes (Signed)
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  He did not lose consciousness and does not think he hit his head but did have some left-sided chest pain.  Given his symptoms he decided to come to the hospital and he was noted to have some gross hematuria though started clearing up.  Lab works showed an increased creatinine from his normal baseline.  Urinalysis completed and showed evidence of microscopic hematuria well.  He underwent a CT head which was unremarkable as well as CT of the chest abdomen pelvis which showed an acute left sixth rib fracture and other prior well-healed rib fractures.  He was seen by general surgery and urology and PT OT recommended SNF.  His gross hematuria is improved.  This morning he had a period of unresponsiveness and a stroke alert was called.  Stroke was ruled out and this felt that his decreased responsiveness was in the setting of his Parkinson's and it was felt that he had "frozen" episode.  His Sinemet has been resumed.  Neurology recommended obtaining EEG for completion but no further workup was needed.  They recommended that the patient receive a Sinemet scheduled.  Patient is now deemed medically stable and awaiting SNF placement and insurance authorization.  Assessment and Plan:  [name] is a 77  y.o. male with medical history significant for heart failure with reduced EF, recent hospital stay for suspected ESBL UTI, CAD status post coronary stent March 2024 on aspirin and Plavix, insulin-dependent type 2 diabetes, hypertension, Parkinson's disease, hyperlipidemia, GERD being admitted to the hospital with left-sided acute rib fracture and gross hematuria after a mechanical fall in the shower day before last.    Mechanical Fall with Left Sixth Rib Fracture and Generalized Weakness -With minimal displacement, no associated hematoma or pneumothorax. -Inpatient admission -Pain control with tramadol, lidocaine patch, IV Dilaudid for severe pain -Appreciate trauma surgery consultation, they will follow -Order Incentive Spirometry and pulmonary toileting -PT OT recommending SNF and selected an once form and bed availability will be on Monday.   Decreased Responsiveness and Aphasia, improved -In the setting of Parkinson's and likely "Frozen" -Stroke Ruled Out given MRI Negative for CVA -CT Head done and showed "Stable noncontrast CT appearance of the brain from two days ago, negative for age. ASPECTS 10." -CTA Head and Neck done and showed "CTA is negative; No large vessel occlusion. And minimal to mild for age atherosclerosis in the head and neck with no arterial stenosis." -MRI Brain one and showed "No acute intracranial abnormality. 2. Mild for age signal changes in the cerebral white matter and a solitary chronic microhemorrhage in the right midbrain most compatible with chronic small vessel disease." -SLP evaluated and feel he is a mild aspiration risk and recommending Regular Diet and Thin Liquid -Ensure patient gets Sinemet as scheduled and resume Entacapone -Check EEG and done and showed "This study is within  normal limits. No seizures or epileptiform discharges were seen throughout the recording.  A normal interictal EEG does not exclude the diagnosis of epilepsy." -Patient appears  baseline now; IVF now stopped and he remains medically stable for D/C  Gross Hematuria -After recent trauma, patient is urinating well without current evidence of retention.  Family states urine seems to be clearing up and less bloody. -Patient is hemodynamically stable without anemia -Discussed with Dr. Laverle Patter of urology, who recommends no Foley catheter, okay to continue antiplatelet therapy for now -Dr. Laverle Patter recommending outpatient follow up -Foley and CBI in case of retention but seems ok   Heart Failure with reduced EF -Patient currently appears euvolemic -Continue home dose Lasix for now but will start timed Fluids short term -Strict I's and O's and Daily Weights  Intake/Output Summary (Last 24 hours) at 07/07/2023 1601 Last data filed at 07/07/2023 1610 Gross per 24 hour  Intake --  Output 1750 ml  Net -1750 ml  -Continue Sacubitril-Valsartan 24-26 mg 1 tab po BID and Metoprolol Succinate 25 mg po daily  -Continue to monitor for signs and symptoms of volume overload   Insulin-Dependent Type 2 Diabetes -Poorly controlled with hemoglobin A1c 11% in June 2024 -Carb controlled diet when eating -Reduced dose Lantus nightly to 8 units -C/w Moderate Novolog Sliding Scale Insulin  -CBG and Glucose Trend: Recent Labs  Lab 07/05/23 2041 07/06/23 0725 07/06/23 1132 07/06/23 1624 07/06/23 2129 07/07/23 0807 07/07/23 1154  GLUCAP 197* 254* 125* 215* 179* 205* 164*   Recent Labs  Lab 06/24/23 0604 06/25/23 9604 07/03/23 1126 07/04/23 0612 07/05/23 0554 07/06/23 5409 07/07/23 0637  GLUCOSE 202* 121* 163* 185* 134* 270* 198*    Parkinson's Disease -Continue Sinemet as scheduled and now resume Entacapone 200 mg po TID -Outpatient follow up with Neurology Dr. Arbutus Leas at discharge   CAD -Status post PCI in March -Continue Aspirin and Plavix and Rosuvastatin -Resume Metoprolol Succinate 25 mg po Daily  -May have to consider holding antiplatelets in case of severe bleeding  however improving Hematuria and resolution    CKD Stage 3a, stable -Initially thought to be an AKI but likely has CKD stage IIIa and was mild, possibly due to mild dehydration since his fall, or intermittent urinary retention due to his gross hematuria.   -BUN/Cr Trend: Recent Labs  Lab 06/24/23 0604 06/25/23 0608 07/03/23 1126 07/04/23 0612 07/05/23 0554 07/06/23 8119 07/07/23 0637  BUN 25* 15 33* 25* 24* 26* 20  CREATININE 1.41* 1.13 1.27* 1.35* 0.99 1.40* 1.07  -IVF now stopped -Avoid Nephrotoxic Medications, Contrast Dyes, Hypotension and Dehydration to Ensure Adequate Renal Perfusion and will need to Renally Adjust Meds -Continue to Monitor and Trend Renal Function carefully and repeat CMP in the AM   Hypophosphatemia -Phos Level Trend: Recent Labs  Lab 07/05/23 0554 07/06/23 0632 07/07/23 0637  PHOS 3.2 3.0 2.4*  -Replete with po K Phos Neutral 500 mg po x1 -Continue to Monitor and Trend and Repeat Phos Level in the AM  Subconjunctival Hemorrhage -Likely due to Plavix use, followed by Dr. Dione Booze -Continue supportive care and monitoring    Depression -C/w Sertraline 50 mg Daily   GERD/GI Prophylaxis -C/w Pantoprazole 40 mg po Daily   Hypoalbuminemia -Patient's Albumin Trend: Recent Labs  Lab 06/25/23 0608 07/03/23 1126 07/05/23 0554 07/06/23 0632 07/07/23 0637  ALBUMIN 3.1* 3.7 3.2* 3.3* 3.2*  -Continue to Monitor and Trend and repeat CMP in the AM  Overweight -Complicates overall prognosis and care -Estimated body mass index  is 29.27 kg/m as calculated from the following:   Height as of this encounter: 5\' 8"  (1.727 m).   Weight as of this encounter: 87.3 kg.  -Weight Loss and Dietary Counseling given   DVT prophylaxis: enoxaparin (LOVENOX) injection 40 mg Start: 07/05/23 2200 SCDs Start: 07/03/23 1714    Code Status: Limited: Do not attempt resuscitation (DNR) -DNR-LIMITED -Do Not Intubate/DNI  Family Communication: Discussed with Wife at  bedside  Disposition Plan:  Level of care: Telemetry Status is: Inpatient Remains inpatient appropriate because: Medically stable to D/C but cannot be discharged until tomorrow given SNF Availability   Consultants:  Urology Discussed with Neurology  Procedures:  As delineated as above  Antimicrobials:  Anti-infectives (From admission, onward)    Start     Dose/Rate Route Frequency Ordered Stop   07/05/23 1800  methenamine (MANDELAMINE) tablet 500 mg        500 mg Oral 4 times daily 07/05/23 1524         Subjective: And examined at bedside and he is doing okay and felt okay.  Wife states that she did not want take his medication this morning and has not been drinking as much water but with encouragement we will try.  No nausea or vomiting.  Denies lightheadedness or dizziness.  No other concerns or complaints this time.  Objective: Vitals:   07/06/23 2133 07/07/23 0618 07/07/23 1020 07/07/23 1449  BP: 128/65 136/63 (!) 131/94 (!) 144/77  Pulse: (!) 51 (!) 50 (!) 53 (!) 55  Resp: 18 20  20   Temp: 98.1 F (36.7 C) 98.3 F (36.8 C)  97.8 F (36.6 C)  TempSrc: Oral Oral  Oral  SpO2: 97% 98%  100%  Weight:      Height:        Intake/Output Summary (Last 24 hours) at 07/07/2023 1601 Last data filed at 07/07/2023 6045 Gross per 24 hour  Intake --  Output 1750 ml  Net -1750 ml   Filed Weights   07/03/23 1043  Weight: 87.3 kg   Examination: Physical Exam:  Constitutional: Thin Caucasian male currently no acute distress sitting up Respiratory: Diminished to auscultation bilaterally, no wheezing, rales, rhonchi or crackles. Normal respiratory effort and patient is not tachypenic. No accessory muscle use.  Unlabored breathing Cardiovascular: RRR, no murmurs / rubs / gallops. S1 and S2 auscultated.  Very mild extremity edema. .  Abdomen: Soft, non-tender, non-distended. Bowel sounds positive.  GU: Deferred. Musculoskeletal: No clubbing / cyanosis of digits/nails. No joint  deformity upper and lower extremities.  Skin: No rashes, lesions, ulcers on a limited skin evaluation but does have some bruising under his left eye. No induration; Warm and dry.  Neurologic: CN 2-12 grossly intact with no focal deficits. Romberg sign and cerebellar reflexes not assessed.  Psychiatric: Normal judgment and insight. Alert and oriented x 3. Normal mood and appropriate affect.   Data Reviewed: I have personally reviewed following labs and imaging studies  CBC: Recent Labs  Lab 07/03/23 1126 07/04/23 0612 07/05/23 0554 07/06/23 0632 07/07/23 0637  WBC 7.1 5.1 6.2 6.2 5.0  NEUTROABS 5.2  --  4.5 4.8 3.7  HGB 14.3 13.4 14.6 14.3 13.8  HCT 45.5 42.7 48.0 45.8 42.9  MCV 93.8 93.8 96.4 94.2 93.1  PLT 244 212 215 236 216   Basic Metabolic Panel: Recent Labs  Lab 07/03/23 1126 07/04/23 0612 07/05/23 0554 07/06/23 0632 07/07/23 0637  NA 140 138 139 140 139  K 4.3 4.4 3.5 4.1 4.0  CL 104 103 105 102 103  CO2 25 29 24 27 29   GLUCOSE 163* 185* 134* 270* 198*  BUN 33* 25* 24* 26* 20  CREATININE 1.27* 1.35* 0.99 1.40* 1.07  CALCIUM 9.2 8.7* 8.7* 8.9 8.8*  MG  --   --  2.4 2.1 2.2  PHOS  --   --  3.2 3.0 2.4*   GFR: Estimated Creatinine Clearance: 63.1 mL/min (by C-G formula based on SCr of 1.07 mg/dL). Liver Function Tests: Recent Labs  Lab 07/03/23 1126 July 16, 2023 0554 07/06/23 0632 07/07/23 0637  AST 11* 10* 6* <5*  ALT <5 5 7 6   ALKPHOS 79 69 72 84  BILITOT 0.7 0.8 0.6 0.7  PROT 6.5 6.0* 6.2* 6.2*  ALBUMIN 3.7 3.2* 3.3* 3.2*   No results for input(s): "LIPASE", "AMYLASE" in the last 168 hours. No results for input(s): "AMMONIA" in the last 168 hours. Coagulation Profile: No results for input(s): "INR", "PROTIME" in the last 168 hours. Cardiac Enzymes: No results for input(s): "CKTOTAL", "CKMB", "CKMBINDEX", "TROPONINI" in the last 168 hours. BNP (last 3 results) No results for input(s): "PROBNP" in the last 8760 hours. HbA1C: No results for input(s):  "HGBA1C" in the last 72 hours. CBG: Recent Labs  Lab 07/06/23 1132 07/06/23 1624 07/06/23 2129 07/07/23 0807 07/07/23 1154  GLUCAP 125* 215* 179* 205* 164*   Lipid Profile: No results for input(s): "CHOL", "HDL", "LDLCALC", "TRIG", "CHOLHDL", "LDLDIRECT" in the last 72 hours. Thyroid Function Tests: No results for input(s): "TSH", "T4TOTAL", "FREET4", "T3FREE", "THYROIDAB" in the last 72 hours. Anemia Panel: No results for input(s): "VITAMINB12", "FOLATE", "FERRITIN", "TIBC", "IRON", "RETICCTPCT" in the last 72 hours. Sepsis Labs: No results for input(s): "PROCALCITON", "LATICACIDVEN" in the last 168 hours.  No results found for this or any previous visit (from the past 240 hour(s)).   Radiology Studies: EEG adult  Result Date: 2023/07/16 Charlsie Quest, MD     2023-07-16  4:15 PM Patient Name: GLENROY BABAUTA MRN: [id number] Epilepsy Attending: Charlsie Quest Referring Physician/Provider: Merlene Laughter, DO Date: 07-16-2023 Duration: 24.45 mins Patient history: 77 yo M with ams getting eeg to evaluate for seizure. Level of alertness: Awake AEDs during EEG study: None Technical aspects: This EEG study was done with scalp electrodes positioned according to the 10-20 International system of electrode placement. Electrical activity was reviewed with band pass filter of 1-70Hz , sensitivity of 7 uV/mm, display speed of 39mm/sec with a 60Hz  notched filter applied as appropriate. EEG data were recorded continuously and digitally stored.  Video monitoring was available and reviewed as appropriate. Description: The posterior dominant rhythm consists of 8 Hz activity of moderate voltage (25-35 uV) seen predominantly in posterior head regions, symmetric and reactive to eye opening and eye closing.  Photic driving was not seen during photic stimulation. Hperventilation was not performed.   IMPRESSION: This study is within normal limits. No seizures or epileptiform discharges were seen throughout the  recording. A normal interictal EEG does not exclude the diagnosis of epilepsy. Priyanka Annabelle Harman    Scheduled Meds:  acetaminophen  650 mg Oral Q6H   aspirin EC  81 mg Oral Daily   carbidopa-levodopa  3 tablet Oral 6 times per day   cholecalciferol  2,000 Units Oral Daily   clopidogrel  75 mg Oral Q breakfast   vitamin B-12  2,000 mcg Oral Daily   enoxaparin (LOVENOX) injection  40 mg Subcutaneous QHS   entacapone  200 mg Oral 3 times per day   furosemide  20 mg Oral BID   insulin aspart  0-15 Units Subcutaneous TID WC   insulin aspart  0-5 Units Subcutaneous QHS   insulin glargine-yfgn  8 Units Subcutaneous QHS   lidocaine  1 patch Transdermal Q24H   methenamine  500 mg Oral QID   methocarbamol  500 mg Oral Q6H   pantoprazole  40 mg Oral Daily   potassium chloride  10 mEq Oral BID   rOPINIRole  1.5 mg Oral q1800   rOPINIRole  3 mg Oral BID WC   rosuvastatin  40 mg Oral Daily   sacubitril-valsartan  1 tablet Oral BID   sertraline  50 mg Oral Daily   tamsulosin  0.4 mg Oral Daily   Continuous Infusions:   LOS: 4 days   Marguerita Merles, DO [facility] Available via Epic secure chat 7am-7pm After these hours, please refer to coverage provider listed on [email or web address] 07/07/2023, 4:01 PM

## 2023-07-08 DIAGNOSIS — N1831 Chronic kidney disease, stage 3a: Secondary | ICD-10-CM | POA: Diagnosis not present

## 2023-07-08 DIAGNOSIS — R2689 Other abnormalities of gait and mobility: Secondary | ICD-10-CM | POA: Diagnosis not present

## 2023-07-08 DIAGNOSIS — E8809 Other disorders of plasma-protein metabolism, not elsewhere classified: Secondary | ICD-10-CM | POA: Diagnosis not present

## 2023-07-08 DIAGNOSIS — Z9181 History of falling: Secondary | ICD-10-CM | POA: Diagnosis not present

## 2023-07-08 DIAGNOSIS — M6281 Muscle weakness (generalized): Secondary | ICD-10-CM | POA: Diagnosis not present

## 2023-07-08 DIAGNOSIS — W19XXXA Unspecified fall, initial encounter: Secondary | ICD-10-CM | POA: Diagnosis not present

## 2023-07-08 DIAGNOSIS — Y92009 Unspecified place in unspecified non-institutional (private) residence as the place of occurrence of the external cause: Secondary | ICD-10-CM | POA: Diagnosis not present

## 2023-07-08 DIAGNOSIS — I5032 Chronic diastolic (congestive) heart failure: Secondary | ICD-10-CM | POA: Diagnosis not present

## 2023-07-08 DIAGNOSIS — E1122 Type 2 diabetes mellitus with diabetic chronic kidney disease: Secondary | ICD-10-CM | POA: Diagnosis not present

## 2023-07-08 DIAGNOSIS — S2232XD Fracture of one rib, left side, subsequent encounter for fracture with routine healing: Secondary | ICD-10-CM | POA: Diagnosis not present

## 2023-07-08 DIAGNOSIS — R531 Weakness: Secondary | ICD-10-CM | POA: Diagnosis not present

## 2023-07-08 DIAGNOSIS — I2510 Atherosclerotic heart disease of native coronary artery without angina pectoris: Secondary | ICD-10-CM | POA: Diagnosis not present

## 2023-07-08 DIAGNOSIS — K219 Gastro-esophageal reflux disease without esophagitis: Secondary | ICD-10-CM | POA: Diagnosis not present

## 2023-07-08 DIAGNOSIS — S2232XA Fracture of one rib, left side, initial encounter for closed fracture: Secondary | ICD-10-CM | POA: Diagnosis not present

## 2023-07-08 DIAGNOSIS — R2681 Unsteadiness on feet: Secondary | ICD-10-CM | POA: Diagnosis not present

## 2023-07-08 DIAGNOSIS — R4701 Aphasia: Secondary | ICD-10-CM | POA: Diagnosis not present

## 2023-07-08 DIAGNOSIS — R310 Gross hematuria: Secondary | ICD-10-CM | POA: Diagnosis not present

## 2023-07-08 DIAGNOSIS — I129 Hypertensive chronic kidney disease with stage 1 through stage 4 chronic kidney disease, or unspecified chronic kidney disease: Secondary | ICD-10-CM | POA: Diagnosis not present

## 2023-07-08 DIAGNOSIS — H1130 Conjunctival hemorrhage, unspecified eye: Secondary | ICD-10-CM | POA: Diagnosis not present

## 2023-07-08 DIAGNOSIS — G20A1 Parkinson's disease without dyskinesia, without mention of fluctuations: Secondary | ICD-10-CM | POA: Diagnosis not present

## 2023-07-08 LAB — GLUCOSE, CAPILLARY
Glucose-Capillary: 133 mg/dL — ABNORMAL HIGH (ref 70–99)
Glucose-Capillary: 148 mg/dL — ABNORMAL HIGH (ref 70–99)

## 2023-07-08 MED ORDER — ACETAMINOPHEN 325 MG PO TABS: 650.0000 mg | ORAL_TABLET | Freq: Four times a day (QID) | ORAL | Status: AC

## 2023-07-08 MED ORDER — ONDANSETRON HCL 4 MG PO TABS: 4.0000 mg | ORAL_TABLET | Freq: Four times a day (QID) | ORAL | 0 refills | Status: AC | PRN

## 2023-07-08 MED ORDER — INSULIN GLARGINE-YFGN 100 UNIT/ML ~~LOC~~ SOLN: 8.0000 [IU] | Freq: Every day | SUBCUTANEOUS | Status: AC

## 2023-07-08 MED ORDER — FUROSEMIDE 20 MG PO TABS: 20.0000 mg | ORAL_TABLET | Freq: Two times a day (BID) | ORAL | Status: AC

## 2023-07-08 MED ORDER — LIDOCAINE 5 % EX PTCH: 1.0000 | MEDICATED_PATCH | CUTANEOUS | Status: AC

## 2023-07-08 NOTE — TOC Transition Note (Signed)
Transition of Care [facility]) - CM/SW Discharge Note   Patient Details  [name] MRN: [id number] Date of Birth: Mar 01, 1946  Transition of Care [facility]) CM/SW Contact:  Otelia Santee, LCSW Phone Number: 07/08/2023, 1:03 PM   Clinical Narrative:    Pt is able to transfer to SNF at [facility]. Pt will be going to room 509. RN to call report to [phone]. Pt's family to provide transportation for pt at discharge. DC packet placed at RN station.   Final next level of care: Skilled Nursing Facility Barriers to Discharge: Barriers Resolved   Patient Goals and CMS Choice CMS Medicare.gov Compare Post Acute Care list provided to:: Patient Choice offered to / list presented to : Patient, Spouse  Discharge Placement     Existing PASRR number confirmed : 07/04/23                Patient and family notified of of transfer: 07/08/23  Discharge Plan and Services Additional resources added to the After Visit Summary for   In-house Referral: Clinical Social Work Discharge Planning Services: NA Post Acute Care Choice: Skilled Nursing Facility          DME Arranged: N/A DME Agency: NA                  Social Determinants of Health (SDOH) Interventions SDOH Screenings   Food Insecurity: No Food Insecurity (07/04/2023)  Housing: Low Risk  (07/04/2023)  Recent Concern: Housing - Medium Risk (06/24/2023)  Transportation Needs: No Transportation Needs (07/04/2023)  Utilities: Not At Risk (07/04/2023)  Depression (PHQ2-9): Low Risk  (05/29/2022)  Tobacco Use: Low Risk  (07/03/2023)     Readmission Risk Interventions    07/05/2023   12:50 PM 07/04/2023    2:29 PM 06/25/2023   12:33 PM  Readmission Risk Prevention Plan  Transportation Screening Complete Complete Complete  PCP or Specialist Appt within 5-7 Days Complete Complete Complete  Home Care Screening Complete Complete Complete  Medication Review (RN CM) Complete Complete Referral to Pharmacy

## 2023-07-08 NOTE — Progress Notes (Signed)
Mobility Specialist - Progress Note   07/08/23 1300  Mobility  Activity Transferred from chair to bed  Level of Assistance Contact guard assist, steadying assist  Assistive Device None  Distance Ambulated (ft) 3 ft  Range of Motion/Exercises Active  Activity Response Tolerated well  Mobility Referral Yes  $Mobility charge 1 Mobility  Mobility Specialist Start Time (ACUTE ONLY) 1334  Mobility Specialist Stop Time (ACUTE ONLY) 1340  Mobility Specialist Time Calculation (min) (ACUTE ONLY) 6 min   Pt received in chair requesting assistance to bed. Moved to bed with MinA for STS contact for transfer.  Left in bed with all needs met alarm on.   Marilynne Halsted Mobility Specialist

## 2023-07-08 NOTE — Discharge Summary (Signed)
Physician Discharge Summary   Patient: [name] MRN: [id number] DOB: 1945-12-27  Admit date:     07/03/2023  Discharge date: 07/08/23  Discharge Physician: Marguerita Merles, DO   PCP: Emilio Aspen, MD   Recommendations at discharge:   Follow-up with PCP within 1 to 2 weeks and repeat CBC, CMP, Mag, Phos Follow-up with Urology Dr. Adair Laundry in outpatient setting Follow-up with Neurology in outpatient setting within 2 weeks Follow-up with cardiology outpatient setting within 1 to 2 weeks  Discharge Diagnoses: Principal Problem:   Gross hematuria  Resolved Problems:   * No resolved hospital problems. 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  He did not lose consciousness and does not think he hit his head but did have some left-sided chest pain.  Given his symptoms he decided to come to the hospital and he was noted to have some gross hematuria though started clearing up.  Lab works showed an increased creatinine from his normal baseline.  Urinalysis completed and showed evidence of microscopic hematuria well.  He underwent a CT head which was unremarkable as well as CT of the chest abdomen pelvis which showed an acute left sixth rib fracture and other prior well-healed rib fractures.  He was seen by general surgery and urology and PT OT recommended SNF.  His gross hematuria is improved.  This morning he had a period of  unresponsiveness and a stroke alert was called.  Stroke was ruled out and this felt that his decreased responsiveness was in the setting of his Parkinson's and it was felt that he had "frozen" episode.  His Sinemet has been resumed.  Neurology recommended obtaining EEG for completion but no further workup was needed.  They recommended that the patient receive a Sinemet scheduled.  Patient is now deemed medically stable and awaiting SNF placement and insurance authorization has been received and he will be discharged 07/08/2023 and will need to follow-up with PCP, urology, cardiology as well as neurology in outpatient setting.  Assessment and Plan:  [name] is a 77 y.o. male with medical history significant for heart failure with reduced EF, recent hospital stay for suspected ESBL UTI, CAD status post coronary stent March 2024 on aspirin and Plavix, insulin-dependent type 2 diabetes, hypertension, Parkinson's disease, hyperlipidemia, GERD being admitted to the hospital with left-sided acute rib fracture and gross hematuria after a mechanical fall in the shower day before last.    Mechanical Fall with Left Sixth Rib Fracture and Generalized Weakness -With minimal displacement, no associated hematoma or pneumothorax. -Inpatient admission -Pain control with tramadol, lidocaine patch, IV Dilaudid for severe pain -Appreciate trauma surgery consultation, they will follow -Order Incentive Spirometry and pulmonary toileting -PT OT recommending SNF and bed is available today and he is stable for discharge   Decreased Responsiveness and Aphasia, improved -In the setting of Parkinson's and likely "Frozen" -Stroke Ruled Out given MRI Negative for CVA -CT Head done and showed "Stable noncontrast CT appearance of the brain from two  days ago, negative for age. ASPECTS 10." -CTA Head and Neck done and showed "CTA is negative; No large vessel occlusion. And minimal to mild for age atherosclerosis in the head  and neck with no arterial stenosis." -MRI Brain one and showed "No acute intracranial abnormality. 2. Mild for age signal changes in the cerebral white matter and a solitary chronic microhemorrhage in the right midbrain most compatible with chronic small vessel disease." -SLP evaluated and feel he is a mild aspiration risk and recommending Regular Diet and Thin Liquid -Ensure patient gets Sinemet as scheduled and resume Entacapone -Check EEG and done and showed "This study is within normal limits. No seizures or epileptiform discharges were seen throughout the recording.  A normal interictal EEG does not exclude the diagnosis of epilepsy." -Patient appears baseline now; IVF now stopped and he remains medically stable for D/C  Gross Hematuria, improved -After recent trauma, patient is urinating well without current evidence of retention.  Family states urine seems to be clearing up and less bloody. -Patient is hemodynamically stable without anemia -Discussed with Dr. Laverle Patter of urology, who recommends no Foley catheter, okay to continue antiplatelet therapy for now -Dr. Laverle Patter recommending outpatient follow up -Foley and CBI in case of retention but seems ok   Heart Failure with reduced EF -Patient currently appears euvolemic -Continue home dose Lasix for now but will start timed Fluids short term -Strict I's and O's and Daily Weights  Intake/Output Summary (Last 24 hours) at 07/08/2023 1212 Last data filed at 07/08/2023 0745 Gross per 24 hour  Intake 360 ml  Output 2050 ml  Net -1690 ml  -Continue Sacubitril-Valsartan 24-26 mg 1 tab po BID and Metoprolol Succinate 25 mg po daily  -Continue to monitor for signs and symptoms of volume overload and follow-up with cardiology outpatient setting   Insulin-Dependent Type 2 Diabetes -Poorly controlled with hemoglobin A1c 11% in June 2024 -Carb controlled diet when eating -Reduced dose Lantus nightly to 8 units -C/w Moderate Novolog Sliding  Scale Insulin  -CBG and Glucose Trend: Recent Labs  Lab 07/06/23 2129 07/07/23 0807 07/07/23 1154 07/07/23 1615 07/07/23 2159 07/08/23 0737 07/08/23 1136  GLUCAP 179* 205* 164* 159* 165* 133* 148*   Recent Labs  Lab 06/24/23 0604 06/25/23 9147 07/03/23 1126 07/04/23 0612 07/05/23 0554 07/06/23 8295 07/07/23 0637  GLUCOSE 202* 121* 163* 185* 134* 270* 198*    Parkinson's Disease -Continue Sinemet as scheduled and now resume Entacapone 200 mg po TID -Outpatient follow up with Neurology Dr. Arbutus Leas at discharge   CAD -Status post PCI in March -Continue Aspirin and Plavix and Rosuvastatin -Resume Metoprolol Succinate 25 mg po Daily  -May have to consider holding antiplatelets in case of severe bleeding however improving Hematuria and resolution    CKD Stage 3a, stable -Initially thought to be an AKI but likely has CKD stage IIIa and was mild, possibly due to mild dehydration since his fall, or intermittent urinary retention due to his gross hematuria.   -BUN/Cr Trend: Recent Labs  Lab 06/24/23 0604 06/25/23 0608 07/03/23 1126 07/04/23 0612 07/05/23 0554 07/06/23 0632 07/07/23 0637  BUN 25* 15 33* 25* 24* 26* 20  CREATININE 1.41* 1.13 1.27* 1.35* 0.99 1.40* 1.07  -IVF now stopped -Avoid Nephrotoxic Medications, Contrast Dyes, Hypotension and Dehydration to Ensure Adequate Renal Perfusion and will need to Renally Adjust Meds -Continue to Monitor and Trend Renal Function carefully and repeat CMP within 1 week  Hypophosphatemia -Phos Level Trend: Recent Labs  Lab 07/05/23 0554 07/06/23 6213  07/07/23 0637  PHOS 3.2 3.0 2.4*  -Replete with po K Phos Neutral 500 mg po x1 -Continue to Monitor and Trend and Repeat Phos Level in the AM  Subconjunctival Hemorrhage -Likely due to Plavix use, followed by Dr. Dione Booze -Continue supportive care and monitoring    Depression -C/w Sertraline 50 mg Daily   GERD/GI Prophylaxis -C/w Pantoprazole 40 mg po Daily    Hypoalbuminemia -Patient's Albumin Trend: Recent Labs  Lab 06/25/23 0608 07/03/23 1126 07/05/23 0554 07/06/23 0632 07/07/23 0637  ALBUMIN 3.1* 3.7 3.2* 3.3* 3.2*  -Continue to Monitor and Trend and repeat CMP in the AM  Overweight -Complicates overall prognosis and care -Estimated body mass index is 29.27 kg/m as calculated from the following:   Height as of this encounter: 5\' 8"  (1.727 m).   Weight as of this encounter: 87.3 kg.  -Weight Loss and Dietary Counseling given  Consultants: Urology Procedures performed: As delineated as above Disposition: Skilled nursing facility Diet recommendation:  Cardiac and Carb modified diet DISCHARGE MEDICATION: Allergies as of 07/08/2023       Reactions   Nucynta [tapentadol] Other (See Comments)   Made the patient not feel well. "He did not feel well at all."   Oxycodone Itching   Sudafed [pseudoephedrine Hcl] Other (See Comments)   Problems urinating    Tramadol Hcl Itching, Other (See Comments)   Can tolerate, however (in 2023)        Medication List     STOP taking these medications    Advil 200 MG Caps Generic drug: Ibuprofen   Lantus SoloStar 100 UNIT/ML Solostar Pen Generic drug: insulin glargine Replaced by: insulin glargine-yfgn 100 UNIT/ML injection   metoprolol succinate 50 MG 24 hr tablet Commonly known as: TOPROL-XL       TAKE these medications    acetaminophen 325 MG tablet Commonly known as: TYLENOL Take 2 tablets (650 mg total) by mouth every 6 (six) hours. What changed:  medication strength how much to take when to take this reasons to take this   aspirin EC 81 MG tablet Take 81 mg by mouth daily. Swallow whole.   carbidopa-levodopa 25-100 MG tablet Commonly known as: SINEMET IR TAKE 3 TABLETS BY MOUTH AT 7:00 AM, 3 TABS AT 10:00 AM, 3 TABS AT 12:00 PM, 3 TABS AT 2:00 PM, 3 TABS AT 4:00, AND 3 TABS AT 6:00 PM What changed: See the new instructions.   clopidogrel 75 MG  tablet Commonly known as: PLAVIX Take 1 tablet (75 mg total) by mouth daily with breakfast.   cyanocobalamin 1000 MCG tablet Commonly known as: VITAMIN B12 Take 2,000 mcg by mouth daily.   Dexcom G7 Sensor Misc 1 each by Does not apply route as needed. What changed:  how to take this when to take this additional instructions   entacapone 200 MG tablet Commonly known as: COMTAN TAKE 1 TABLET BY MOUTH 3 TIMES DAILY (WITH FIRST THREE DOSAGES OF CARBIDOPA/LEVODOPA). What changed: See the new instructions.   Entresto 24-26 MG Generic drug: sacubitril-valsartan TAKE 1 TABLET BY MOUTH 2 TIMES DAILY.   furosemide 20 MG tablet Commonly known as: LASIX Take 1 tablet (20 mg total) by mouth 2 (two) times daily.   Global Ease Inject Pen Needles 31G X 5 MM Misc Generic drug: Insulin Pen Needle Inject into the skin.   insulin glargine-yfgn 100 UNIT/ML injection Commonly known as: SEMGLEE Inject 0.08 mLs (8 Units total) into the skin at bedtime. Replaces: Lantus SoloStar 100 UNIT/ML Solostar Pen  insulin lispro 100 UNIT/ML KwikPen Commonly known as: HUMALOG Inject 2-15 Units into the skin 4 (four) times daily -  before meals and at bedtime. CBG 121 - 150: 2 units, CBG 151 - 200: 3 units, CBG 201 - 250: 5 units, CBG 251 - 300: 8 units, CBG 301 - 350: 11 units, CBG 351 - 400: 15 units What changed:  how much to take when to take this additional instructions   lidocaine 5 % Commonly known as: LIDODERM Place 1 patch onto the skin daily. Remove & Discard patch within 12 hours or as directed by MD   methenamine 1 g tablet Commonly known as: HIPREX TAKE 1 TABLET BY MOUTH 2 TIMES DAILY. TAKE WITH ANY VITAMIN C, TWICE A DAY FOR UTI PREVENTION What changed: See the new instructions.   nitroGLYCERIN 0.4 MG SL tablet Commonly known as: Nitrostat Place 1 tablet (0.4 mg total) under the tongue every 5 (five) minutes as needed. What changed: reasons to take this   ondansetron 4 MG  tablet Commonly known as: ZOFRAN Take 1 tablet (4 mg total) by mouth every 6 (six) hours as needed for nausea.   pantoprazole 40 MG tablet Commonly known as: PROTONIX TAKE 1 TABLET (40 MG TOTAL) BY MOUTH DAILY   potassium chloride 10 MEQ tablet Commonly known as: KLOR-CON Take 10 mEq by mouth 2 (two) times daily.   rOPINIRole 3 MG tablet Commonly known as: REQUIP Take 1.5-3 mg by mouth See admin instructions. Take 3 mg by mouth with breakfast, 3 mg three hours later, and 1.5 mg in the late afternoon   rosuvastatin 40 MG tablet Commonly known as: Crestor Take 1 tablet (40 mg total) by mouth daily.   sertraline 50 MG tablet Commonly known as: ZOLOFT Take 50 mg by mouth daily.   tamsulosin 0.4 MG Caps capsule Commonly known as: FLOMAX Take 0.4 mg by mouth in the morning and at bedtime.   triamcinolone 55 MCG/ACT Aero nasal inhaler Commonly known as: NASACORT Place 2 sprays into the nose daily as needed (for allergies or rhinitis).   Tums 500 MG chewable tablet Generic drug: calcium carbonate Chew 2 tablets by mouth daily as needed for indigestion or heartburn.   Vitamin D 50 MCG (2000 UT) tablet Take 2,000 Units by mouth daily.        Contact information for after-discharge care     Destination     [facility] Preferred SNF .   Service: Skilled Nursing Contact information: [address] [place] [address] [phone]                    Discharge Exam: Ceasar Mons Weights   07/03/23 1043  Weight: 87.3 kg   Vitals:   07/07/23 2004 07/08/23 0536  BP: (!) 145/66 125/72  Pulse: (!) 49 (!) 51  Resp: 19 18  Temp: 98.2 F (36.8 C) 98.4 F (36.9 C)  SpO2: 100% 98%   Examination: Physical Exam:  Constitutional: WN/WD overweight Caucasian male in no acute distress Respiratory: Diminished to auscultation bilaterally with coarse breath sounds, no wheezing, rales, rhonchi or crackles. Normal respiratory effort and patient is  not tachypenic. No accessory muscle use.  Unlabored breathing Cardiovascular: RRR, no murmurs / rubs / gallops. S1 and S2 auscultated. No extremity edema.  Abdomen: Soft, non-tender, slightly distended secondary to body habitus. Bowel sounds positive.  GU: Deferred. Musculoskeletal: No clubbing / cyanosis of digits/nails. No joint deformity upper and lower extremities.  Skin: Has bruising  on his left side of his face Neurologic: CN 2-12 grossly intact with no focal deficits and has some Parkinson's. Romberg sign and cerebellar reflexes not assessed.  Psychiatric: Normal judgment and insight. Alert and awake  Condition at discharge: stable  The results of significant diagnostics from this hospitalization (including imaging, microbiology, ancillary and laboratory) are listed below for reference.   Imaging Studies: EEG adult  Result Date: 2023/07/26 Charlsie Quest, MD     2023-07-26  4:15 PM Patient Name: DORRIAN THORNES MRN: [id number] Epilepsy Attending: Charlsie Quest Referring Physician/Provider: Merlene Laughter, DO Date: 07/26/2023 Duration: 24.45 mins Patient history: 77 yo M with ams getting eeg to evaluate for seizure. Level of alertness: Awake AEDs during EEG study: None Technical aspects: This EEG study was done with scalp electrodes positioned according to the 10-20 International system of electrode placement. Electrical activity was reviewed with band pass filter of 1-70Hz , sensitivity of 7 uV/mm, display speed of 19mm/sec with a 60Hz  notched filter applied as appropriate. EEG data were recorded continuously and digitally stored.  Video monitoring was available and reviewed as appropriate. Description: The posterior dominant rhythm consists of 8 Hz activity of moderate voltage (25-35 uV) seen predominantly in posterior head regions, symmetric and reactive to eye opening and eye closing.  Photic driving was not seen during photic stimulation. Hperventilation was not performed.   IMPRESSION:  This study is within normal limits. No seizures or epileptiform discharges were seen throughout the recording. A normal interictal EEG does not exclude the diagnosis of epilepsy. Charlsie Quest   MR BRAIN WO CONTRAST  Result Date: July 26, 2023 CLINICAL DATA:  77 year old male code stroke presentation this morning. Nonverbal. EXAM: MRI HEAD WITHOUT CONTRAST TECHNIQUE: Multiplanar, multiecho pulse sequences of the brain and surrounding structures were obtained without intravenous contrast. COMPARISON:  Head CT and CTA head and neck this morning. Previous brain MRI 10/03/2009. FINDINGS: Brain: No restricted diffusion to suggest acute infarction. No midline shift, mass effect, evidence of mass lesion, ventriculomegaly, extra-axial collection or acute intracranial hemorrhage. Cervicomedullary junction and pituitary are within normal limits. Cerebral volume is only mildly diminished in a generalized fashion since the 2010 MRI. Mild ex vacuo appearing ventricular enlargement. Scattered mild for age cerebral white matter T2 and FLAIR hyperintensity has progressed since 2010. The configuration is nonspecific. No cortical encephalomalacia identified. There is a small chronic microhemorrhage in the right midbrain which was less conspicuous on T2 * imaging in 2010 (series 10, image 25 today). No other chronic cerebral blood products identified. Otherwise negative deep gray nuclei, brainstem and cerebellum for age. Vascular: Major intracranial vascular flow voids are stable since 2010. Skull and upper cervical spine: Negative visible cervical spine. Visualized bone marrow signal is within normal limits. Sinuses/Orbits: Postoperative changes to both globes since 2010. Paranasal Visualized paranasal sinuses and mastoids are stable and well aerated. Other: Visible internal auditory structures appear normal. Negative visible scalp and face. IMPRESSION: 1. No acute intracranial abnormality. 2. Mild for age signal changes in the  cerebral white matter and a solitary chronic microhemorrhage in the right midbrain most compatible with chronic small vessel disease. Electronically Signed   By: Odessa Fleming M.D.   On: Jul 26, 2023 08:38   CT HEAD CODE STROKE WO CONTRAST  Addendum Date: 2023/07/26   ADDENDUM REPORT: Jul 26, 2023 07:50 ADDENDUM: Study discussed by telephone with NP ABIGAIL CHAVEZ on 07/26/2023 at 0729 hours. Electronically Signed   By: Odessa Fleming M.D.   On: 07/26/23 07:50   Result Date: 07-26-2023  CLINICAL DATA:  Code stroke. 77 year old male. Unresponsive. Last known well 0548 hours. EXAM: CT HEAD WITHOUT CONTRAST TECHNIQUE: Contiguous axial images were obtained from the base of the skull through the vertex without intravenous contrast. RADIATION DOSE REDUCTION: This exam was performed according to the departmental dose-optimization program which includes automated exposure control, adjustment of the mA and/or kV according to patient size and/or use of iterative reconstruction technique. COMPARISON:  Brain MRI 10/03/2009.  Recent head CT 10/02/2023. FINDINGS: Brain: No midline shift, mass effect, or evidence of intracranial mass lesion. No acute intracranial hemorrhage identified. Stable cerebral volume. Stable ventricular size without convincing acute ventriculomegaly. There is mild chronic nonspecific lateral ventricular prominence. No transependymal edema. Gray-white differentiation is stable from 2 days ago and within normal limits for age. No encephalomalacia identified. Vascular: There is some residual intravascular contrast suspected, probably from CT Chest, Abdomen, and Pelvis 2 days ago. No suspicious intracranial vascular hyperdensity. Mild Calcified atherosclerosis at the skull base. Skull: Stable and intact. Sinuses/Orbits: Visualized paranasal sinuses and mastoids are stable and well aerated. Other: No gaze deviation. No acute orbit or scalp soft tissue finding. ASPECTS [facility] Stroke Program Early CT Score) Total score  (0-10 with 10 being normal): 10 IMPRESSION: Stable noncontrast CT appearance of the brain from two days ago, negative for age. ASPECTS 10. Electronically Signed: By: Odessa Fleming M.D. On: 07/05/2023 07:24   CT ANGIO HEAD NECK W WO CM (CODE STROKE)  Result Date: 07/05/2023 CLINICAL DATA:  76 year old male.  Recent fall.  Now nonverbal. EXAM: CT ANGIOGRAPHY HEAD AND NECK WITH CONTRAST TECHNIQUE: Multidetector CT imaging of the head and neck was performed using the standard protocol during bolus administration of intravenous contrast. Multiplanar CT image reconstructions and MIPs were obtained to evaluate the vascular anatomy. Carotid stenosis measurements (when applicable) are obtained utilizing NASCET criteria, using the distal internal carotid diameter as the denominator. RADIATION DOSE REDUCTION: This exam was performed according to the departmental dose-optimization program which includes automated exposure control, adjustment of the mA and/or kV according to patient size and/or use of iterative reconstruction technique. CONTRAST:  75mL OMNIPAQUE IOHEXOL 350 MG/ML SOLN COMPARISON:  Repeat plain head CT this morning 0711 hours. Recent CT venogram 06/23/2023. CT Chest, Abdomen, and Pelvis today are reported separately. 07/03/2023. FINDINGS: CTA NECK Skeleton: Stable. Mild T2 superior endplate compression appears to be chronic. Degenerative endplate changes C6-C7. No acute osseous abnormality identified. Upper chest: Lower lung volumes with atelectasis.  Otherwise stable. Other neck: No acute finding. Aortic arch: Calcified aortic atherosclerosis. 3 vessel arch. Right carotid system: Minimal brachiocephalic artery plaque without stenosis. Tortuous right CCA origin. No significant right CCA, right carotid or cervical right ICA plaque or stenosis. Mildly tortuous right ICA to the skull base. Left carotid system: Tortuosity with no significant plaque or stenosis. Vertebral arteries: Mildly tortuous right subclavian  artery origin and mild soft and calcified plaque there but no stenosis. Normal right vertebral artery origin. Tortuous and mildly non dominant right vertebral artery is otherwise patent and normal to the skull base. Minimal proximal left subclavian artery plaque and tortuosity at the left thoracic inlet but no stenosis. Left vertebral artery origin is normal. Mildly dominant and tortuous left vertebral artery is patent to the skull base with no plaque or stenosis. CTA HEAD Posterior circulation: Codominant distal vertebral arteries and normal vertebrobasilar junction. No significant plaque or stenosis. Normal left PICA origin. Right AICA appears to be dominant. Patent basilar artery without stenosis. Normal SCA and PCA origins. Small right posterior  communicating artery, the left is diminutive or absent. Bilateral PCA branches are within normal limits. Anterior circulation: Both ICA siphons are patent. On the left there is mild supraclinoid calcified plaque without stenosis. On the right there is no significant plaque or stenosis. Normal right posterior communicating artery origin. Patent carotid termini. Normal MCA and ACA origins. Normal anterior communicating artery. Mildly dominant right ACA A2. Bilateral ACA branches are within normal limits. Left MCA M1 segment and bifurcation are patent without stenosis. Right MCA M1 segment and bifurcation are patent without stenosis. Bilateral MCA branches are stable to the recent CT V on 06/23/2023 within normal limits. Venous sinuses: Early contrast timing, grossly patent. Anatomic variants: Mildly dominant left vertebral artery in the neck. Mildly dominant right ACA A2. Review of the MIP images confirms the above findings IMPRESSION: CTA is negative; No large vessel occlusion. And minimal to mild for age atherosclerosis in the head and neck with no arterial stenosis. Electronically Signed   By: Odessa Fleming M.D.   On: 07/05/2023 07:36   CT HEAD WO CONTRAST ( )  Result  Date: 07/03/2023 CLINICAL DATA:  Minor head trauma EXAM: CT HEAD WITHOUT CONTRAST TECHNIQUE: Contiguous axial images were obtained from the base of the skull through the vertex without intravenous contrast. RADIATION DOSE REDUCTION: This exam was performed according to the departmental dose-optimization program which includes automated exposure control, adjustment of the mA and/or kV according to patient size and/or use of iterative reconstruction technique. COMPARISON:  06/23/2023 FINDINGS: Brain: No acute infarct or hemorrhage. Lateral ventricles and midline structures are stable. No acute extra-axial fluid collections. No mass effect. Vascular: No hyperdense vessel or unexpected calcification. Skull: Normal. Negative for fracture or focal lesion. Sinuses/Orbits: No acute finding. Other: None. IMPRESSION: 1. No acute intracranial process. Electronically Signed   By: Sharlet Salina M.D.   On: 07/03/2023 15:28   CT CHEST ABDOMEN PELVIS W CONTRAST  Result Date: 07/03/2023 CLINICAL DATA:  Larey Seat, left-sided pain, gross hematuria EXAM: CT CHEST, ABDOMEN, AND PELVIS WITH CONTRAST TECHNIQUE: Multidetector CT imaging of the chest, abdomen and pelvis was performed following the standard protocol during bolus administration of intravenous contrast. RADIATION DOSE REDUCTION: This exam was performed according to the departmental dose-optimization program which includes automated exposure control, adjustment of the mA and/or kV according to patient size and/or use of iterative reconstruction technique. CONTRAST:  OMNIPAQUE IOHEXOL 300 MG/ML  SOLN COMPARISON:  06/14/2022 and previous FINDINGS: CT CHEST FINDINGS Cardiovascular: SVC patent. Heart size upper limits normal. No pericardial effusion. 3-vessel coronary calcifications. Central great vessels unremarkable. Mediastinum/Nodes: No mediastinal hematoma, mass, or adenopathy. Lungs/Pleura: No pleural effusion. No pneumothorax. Dependent atelectasis/infiltrate in both  lung bases, left greater than right. Musculoskeletal: Minimally displaced fracture, anterolateral aspect left sixth rib. Healing fractures of left seventh and eighth ribs laterally. CT ABDOMEN PELVIS FINDINGS Hepatobiliary: 2 cm lamellated gallstone near the gallbladder neck. Gallbladder is nondilated. No focal liver lesion or biliary ductal dilatation. Pancreas: Unremarkable. No pancreatic ductal dilatation or surrounding inflammatory changes. Spleen: Normal in size without focal abnormality. Adrenals/Urinary Tract: No adrenal mass. Scattered areas of renal parenchymal loss. 4 mm calculus peripherally mid left kidney. No hydronephrosis. Urinary bladder physiologically distended. Stomach/Bowel: Stomach and small bowel decompressed. Appendix not identified. The colon is partially distended, without acute finding. Vascular/Lymphatic: Mild scattered aortoiliac calcified plaque. No AAA. Portal vein patent. No abdominal or pelvic adenopathy. Reproductive: Mild prostate enlargement. Other: Bilateral pelvic phleboliths.  No ascites.  No free air. Musculoskeletal: Chronic L1 compression fracture deformity. Stable  grade 1 anterolisthesis L4-5. IMPRESSION: 1. Minimally displaced left sixth rib fracture, without pneumothorax or effusion. 2. Healing left seventh and eighth rib fractures. 3. Cholelithiasis. 4. Left nephrolithiasis without hydronephrosis. 5. Chronic L1 compression fracture deformity. 6. Coronary and aortic Atherosclerosis (ICD10-I70.0). Electronically Signed   By: Corlis Leak M.D.   On: 07/03/2023 15:24   DG CHEST PORT 1 VIEW  Result Date: 06/25/2023 CLINICAL DATA:  Cough, malaise EXAM: PORTABLE CHEST 1 VIEW COMPARISON:  Chest radiograph 04/01/2023 FINDINGS: The cardiomediastinal silhouette is stable and within normal limits There is no focal consolidation or pulmonary edema. There is no pleural effusion or pneumothorax. There is a new irregular nodular opacity projecting over the right upper lobe not seen on  the prior study. There is no acute osseous abnormality. IMPRESSION: 1. No radiographic evidence of acute cardiopulmonary process. 2. Possible right upper lobe nodule for which nonemergent outpatient chest CT is recommended. Electronically Signed   By: Lesia Hausen M.D.   On: 06/25/2023 09:05   CT OrbitsS W/O CM  Result Date: 06/23/2023 CLINICAL DATA:  Left periorbital swelling EXAM: CT ORBITS WITHOUT CONTRAST TECHNIQUE: Multidetector CT imaging of the orbits was performed using the standard protocol without intravenous contrast. Multiplanar CT image reconstructions were also generated. RADIATION DOSE REDUCTION: This exam was performed according to the departmental dose-optimization program which includes automated exposure control, adjustment of the mA and/or kV according to patient size and/or use of iterative reconstruction technique. COMPARISON:  None Available. FINDINGS: Orbits: No orbital mass or evidence of inflammation. Normal appearance of the globes, optic nerve-sheath complexes, extraocular muscles, orbital fat and lacrimal glands. Visible paranasal sinuses: Clear. Soft tissues: There is mild left supraorbital soft tissue edema. No abscess or drainable fluid collection. Osseous: No fracture or aggressive lesion. Limited intracranial: No acute or significant finding. IMPRESSION: Mild left supraorbital soft tissue edema without abscess or drainable fluid collection. Electronically Signed   By: Deatra Robinson M.D.   On: 06/23/2023 21:59   CT Head Wo Contrast  Result Date: 06/23/2023 CLINICAL DATA:  Headache EXAM: CT HEAD WITHOUT CONTRAST TECHNIQUE: Contiguous axial images were obtained from the base of the skull through the vertex without intravenous contrast. RADIATION DOSE REDUCTION: This exam was performed according to the departmental dose-optimization program which includes automated exposure control, adjustment of the mA and/or kV according to patient size and/or use of iterative reconstruction  technique. COMPARISON:  None Available. FINDINGS: Brain: There is no mass, hemorrhage or extra-axial collection. The appearance of the white matter is normal for the patient's age. There is generalized atrophy. Vascular: No abnormal hyperdensity of the major intracranial arteries or dural venous sinuses. No intracranial atherosclerosis. Skull: The visualized skull base, calvarium and extracranial soft tissues are normal. Sinuses/Orbits: No fluid levels or advanced mucosal thickening of the visualized paranasal sinuses. No mastoid or middle ear effusion. The orbits are normal. IMPRESSION: Generalized atrophy without acute intracranial abnormality. Electronically Signed   By: Deatra Robinson M.D.   On: 06/23/2023 21:57   CT VENOGRAM HEAD  Result Date: 06/23/2023 CLINICAL DATA:  Headache EXAM: CT VENOGRAM HEAD TECHNIQUE: Venographic phase images of the brain were obtained following the administration of intravenous contrast. Multiplanar reformats and maximum intensity projections were generated. RADIATION DOSE REDUCTION: This exam was performed according to the departmental dose-optimization program which includes automated exposure control, adjustment of the mA and/or kV according to patient size and/or use of iterative reconstruction technique. CONTRAST:  75mL OMNIPAQUE IOHEXOL 350 MG/ML SOLN COMPARISON:  None Available. FINDINGS: Superior sagittal  sinus: Normal. Straight sinus: Normal. Inferior sagittal sinus, vein of Galen and internal cerebral veins: Normal. Transverse sinuses: Normal. Sigmoid sinuses: Normal. Visualized jugular veins: Normal. IMPRESSION: No evidence of dural venous sinus thrombosis. Electronically Signed   By: Deatra Robinson M.D.   On: 06/23/2023 21:50    Microbiology: Results for orders placed or performed during the hospital encounter of 06/23/23  Urine Culture     Status: None   Collection Time: 06/23/23 10:10 PM   Specimen: Urine, Random  Result Value Ref Range Status   Specimen  Description URINE, RANDOM  Final   Special Requests NONE Reflexed from [id number]  Final   Culture   Final    NO GROWTH Performed at [facility], [address], [place], [place] [address]    Report Status 06/25/2023 FINAL  Final  SARS Coronavirus 2 by RT PCR (hospital order, performed in [facility] hospital lab) *cepheid single result test* Anterior Nasal Swab     Status: None   Collection Time: 06/24/23  1:35 AM   Specimen: Anterior Nasal Swab  Result Value Ref Range Status   SARS Coronavirus 2 by RT PCR NEGATIVE NEGATIVE Final    Comment: Performed at [facility], [address] [place], [place] [address]   Labs: CBC: Recent Labs  Lab 07/03/23 1126 07/04/23 0612 07/05/23 0554 07/06/23 0632 07/07/23 0637  WBC 7.1 5.1 6.2 6.2 5.0  NEUTROABS 5.2  --  4.5 4.8 3.7  HGB 14.3 13.4 14.6 14.3 13.8  HCT 45.5 42.7 48.0 45.8 42.9  MCV 93.8 93.8 96.4 94.2 93.1  PLT 244 212 215 236 216   Basic Metabolic Panel: Recent Labs  Lab 07/03/23 1126 07/04/23 0612 07/05/23 0554 07/06/23 0632 07/07/23 0637  NA 140 138 139 140 139  K 4.3 4.4 3.5 4.1 4.0  CL 104 103 105 102 103  CO2 25 29 24 27 29   GLUCOSE 163* 185* 134* 270* 198*  BUN 33* 25* 24* 26* 20  CREATININE 1.27* 1.35* 0.99 1.40* 1.07  CALCIUM 9.2 8.7* 8.7* 8.9 8.8*  MG  --   --  2.4 2.1 2.2  PHOS  --   --  3.2 3.0 2.4*   Liver Function Tests: Recent Labs  Lab 07/03/23 1126 07/05/23 0554 07/06/23 0632 07/07/23 0637  AST 11* 10* 6* <5*  ALT <5 5 7 6   ALKPHOS 79 69 72 84  BILITOT 0.7 0.8 0.6 0.7  PROT 6.5 6.0* 6.2* 6.2*  ALBUMIN 3.7 3.2* 3.3* 3.2*   CBG: Recent Labs  Lab 07/07/23 1154 07/07/23 1615 07/07/23 2159 07/08/23 0737 07/08/23 1136  GLUCAP 164* 159* 165* 133* 148*   Discharge time spent: greater than 30 minutes.  Signed: Marguerita Merles, DO [facility] 07/08/2023

## 2023-07-08 NOTE — Progress Notes (Signed)
Physical Therapy Evaluation Patient Details [name] MRN: [id number] DOB: [birth year] Today's Date: 07/08/2023  History of Present Illness  77 y.o. male with medical history significant for heart failure with reduced EF, recent hospital stay 9/8-9/10/24 for suspected ESBL UTI, CAD status post coronary stent March 2024 on aspirin and Plavix, insulin-dependent type 2 diabetes, hypertension, Parkinson's disease, hyperlipidemia, GERD being admitted to the hospital with left-sided acute L 6th rib fracture and gross hematuria after a mechanical fall in the shower. Dx of acute renal failure.  Clinical Impression    Pt admitted with above diagnosis.  Pt currently with functional limitations due to the deficits listed below (see PT Problem List). Pt in bed when PT arrived. Wife present. Pt agreeable to therapy intervention and indicated no rib or other pain. Pt required min A for supine to sit, CGA for sit to stand  from EOB with slight posterior lean, gait assessed in hallway with RW, CGA and multimodal cues for proper posture and distance from RW for 200 feet. Pt left seated in recliner, all needs in place, MD and wife present. Pt will benefit from acute skilled PT to increase their independence and safety with mobility to allow discharge.         If plan is discharge home, recommend the following: Assistance with cooking/housework;Assist for transportation;Help with stairs or ramp for entrance;A little help with walking and/or transfers;A little help with bathing/dressing/bathroom   Can travel by private vehicle   Yes    Equipment Recommendations None recommended by PT  Recommendations for Other Services       Functional Status Assessment Patient has had a recent decline in their functional status and demonstrates the ability to make significant improvements in function in a reasonable and predictable amount of time.     Precautions / Restrictions Precautions Precautions:  Fall Precaution Comments: parkinsons Restrictions Weight Bearing Restrictions: No      Mobility  Bed Mobility Overal bed mobility: Needs Assistance Bed Mobility: Supine to Sit     Supine to sit: Min assist, HOB elevated, Used rails     General bed mobility comments: pt requires min A for trunk and to scoot in sitting to EOB    Transfers Overall transfer level: Needs assistance Equipment used: Rolling walker (2 wheels) Transfers: Sit to/from Stand, Bed to chair/wheelchair/BSC Sit to Stand: From elevated surface, Contact guard assist           General transfer comment: increased time, slight posterior lean    Ambulation/Gait Ambulation/Gait assistance: Contact guard assist Gait Distance (Feet): 200 Feet Assistive device: Rolling walker (2 wheels) Gait Pattern/deviations: Step-to pattern, Decreased dorsiflexion - right, Trunk flexed Gait velocity: decreased     General Gait Details: pt requires multimodal cues for proper body position from RW, cues for extension posture and maintaining B LE inside RW with turns and approach to sitting surfaces, pt exhibits occational festinaing PD like pattern especially with distraction and poor R foot clearance  Stairs            Wheelchair Mobility     Tilt Bed    Modified Rankin (Stroke Patients Only)       Balance Overall balance assessment: History of Falls, Needs assistance Sitting-balance support: Feet supported, No upper extremity supported Sitting balance-Leahy Scale: Fair Sitting balance - Comments: sitting EOB   Standing balance support: Bilateral upper extremity supported, During functional activity, Reliant on assistive device for balance Standing balance-Leahy Scale: Poor  Pertinent Vitals/Pain Pain Assessment Pain Location: L chest/rib region Pain Descriptors / Indicators: Throbbing, Discomfort    Home Living Family/patient expects to be discharged to::  Private residence Living Arrangements: Spouse/significant other Available Help at Discharge: Family Type of Home: House Home Access: Stairs to enter   [employer] of Steps: 5 steps   Home Layout: Other (Comment) (pt lives in basement. 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    Prior Function Prior Level of Function : Independent/Modified Independent;History of Falls (last six months)             Mobility Comments: several falls in the past 2 weeks; uses RW vs SPC ADLs Comments: wife assists some with compression stockings     Extremity/Trunk Assessment        Lower Extremity Assessment RLE Deficits / Details: B knee ext +4/5 RLE Sensation: WNL LLE Deficits / Details: B knee ext +4/5 LLE Sensation: WNL    Cervical / Trunk Assessment Cervical / Trunk Assessment: Kyphotic  Communication   Communication Communication: No apparent difficulties  Cognition Arousal: Alert Behavior During Therapy: WFL for tasks assessed/performed Overall Cognitive Status: Within Functional Limits for tasks assessed                                          General Comments General comments (skin integrity, edema, etc.): facial bruises, L eye red, abrasions B knees    Exercises     Assessment/Plan    PT Assessment Patient needs continued PT services  PT Problem List Decreased activity tolerance;Decreased strength;Decreased mobility;Pain;Decreased balance       PT Treatment Interventions DME instruction;Gait training;Functional mobility training;Therapeutic activities;Therapeutic exercise;Patient/family education    PT Goals (Current goals can be found in the Care Plan section)  Acute Rehab PT Goals Patient Stated Goal: to get stronger PT Goal Formulation: With patient/family Time  For Goal Achievement: 07/18/23 Potential to Achieve Goals: Good    Frequency Min 1X/week     Co-evaluation               AM-PAC PT "6 Clicks" Mobility  Outcome Measure Help needed turning from your back to your side while in a flat bed without using bedrails?: A Little Help needed moving from lying on your back to sitting on the side of a flat bed without using bedrails?: A Lot Help needed moving to and from a bed to a chair (including a wheelchair)?: A Little Help needed standing up from a chair using your arms (e.g., wheelchair or bedside chair)?: A Little Help needed to walk in hospital room?: A Little Help needed climbing 3-5 steps with a railing? : A Lot 6 Click Score: 16    End of Session Equipment Utilized During Treatment: Gait belt Activity Tolerance: Patient tolerated treatment well Patient left: in chair;with call bell/phone within reach;with chair alarm set;with family/visitor present Nurse Communication: Mobility status PT Visit Diagnosis: Difficulty in walking, not elsewhere classified (R26.2);History of falling (Z91.81);Other abnormalities of gait and mobility (R26.89);Unsteadiness on feet (R26.81)    Time: 4098-1191 PT Time Calculation (min) (ACUTE ONLY): 23 min   Charges:     PT Treatments $Gait Training: 8-22 mins $Therapeutic Activity: 8-22 mins PT General Charges $$ ACUTE PT VISIT: 1 Visit  Johnny Bridge, PT Acute Rehab   Jacqualyn Posey 07/08/2023, 11:44 AM

## 2023-07-08 NOTE — Progress Notes (Signed)
Report called to [employer], spoke to [place]. All questions were answered.

## 2023-07-08 NOTE — Evaluation (Signed)
Speech Language Pathology Evaluation Patient Details [name] MRN: [id number] DOB: 07/22/1946 Today's Date: 07/08/2023 Time: 0920-0950 SLP Time Calculation (min) (ACUTE ONLY): 30 min  Problem List:  Patient Active Problem List   Diagnosis Date Noted   Gross hematuria 07/03/2023   Depression 06/24/2023   Chemosis 06/24/2023   Elevated troponin 04/03/2023   Sepsis (HCC) 04/01/2023   Coronary artery disease involving native coronary 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 angina pectoris 01/11/2023   Chronic combined systolic and diastolic heart failure (HCC)    Dysphagia 04/29/2022   AKI (acute kidney injury) (HCC)    Dehydration    Failure to thrive (child)    Bacteriuria, asymptomatic    Failure to thrive in adult 04/20/2022   Malaise, possible ESBL Klebsiella UTI 04/20/2022   Chronic retention of urine 04/20/2022   Closed lumbar vertebral fracture (HCC) 04/20/2022   UTI (urinary tract infection) 03/01/2022   Leukocytosis 01/01/2022   Hyponatremia 01/01/2022   CKD (chronic kidney disease), stage IIIa (HCC) 01/01/2022   HTN (hypertension) 12/31/2021   History of ESBL Klebsiella pneumoniae infection 12/31/2021   Generalized weakness 12/31/2021   Chronic venous insufficiency 08/29/2021   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   Encounter for general adult medical examination with abnormal findings 02/18/2018   Enlarged prostate 02/18/2018   Hearing loss 02/18/2018   Other long term (current) drug therapy 02/18/2018   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) 02/18/2018   HLD (hyperlipidemia)    Nephrolithiasis    Parkinson disease (HCC)    Diabetes mellitus with coincident hypertension (HCC)    Syncope    Renal calculus    Diabetes (HCC)    Allergic rhinitis    BPH (benign prostatic hyperplasia)    Erectile dysfunction    Dysphagia, pharyngeal phase    Edema    Ureteral calculus 02/09/2013   LEG PAIN 03/14/2010   Past Medical History:  Past  Medical History:  Diagnosis Date   Abnormal involuntary movement 02/18/2018   Allergic rhinitis    Arthritis    BPH (benign prostatic hyperplasia)    Diabetes (HCC)    Dysphagia, pharyngeal phase    GERD diagnosed on barium swallow. Has small hiatal hernia. Symptomatically somewhat better on omeprazole but not entirely. We'll try b.i.d. therapy   Edema    1+ in both ankles, likely multifactorial including medication such as Requip   Erectile dysfunction    Staxyn 10 mg or Viagra worked well. 3 samples of Cialis 20 mg provided   GERD (gastroesophageal reflux disease)    Hypercholesteremia    Hypertension    Nephrolithiasis    Onychomycosis of toenail    April 27, 2013 - Dr. Merwyn Katos - podiatry, was in [place] - treating with oral Lamisil and topical nail therapy   Parkinson's disease     followed by Dr. Raquel Sarna at [facility] and Lesia Sago, M.D. in [place]   Presbycusis    and tinnitus - Dr. Serena Colonel - August/2013   Renal calculus    Syncope    Past Surgical History:  Past Surgical History:  Procedure Laterality Date   CORONARY STENT INTERVENTION N/A 01/11/2023   Procedure: CORONARY STENT INTERVENTION;  Surgeon: Kathleene Hazel, MD;  Location: [facility];  Service: Cardiovascular;  Laterality: N/A;   HAND SURGERY     INGUINAL HERNIA REPAIR Left 11/04/2015   Procedure: LEFT INGUINAL HERNIA REPAIR WITH MESH;  Surgeon: Darnell Level, MD;  Location: [facility];  Service: General;  Laterality: Left;   INSERTION OF MESH Left 11/04/2015   Procedure: INSERTION OF MESH;  Surgeon: Darnell Level, MD;  Location: [facility];  Service: General;  Laterality: Left;   JOINT REPLACEMENT Bilateral    KNEE SURGERY     LEFT HEART CATH AND CORONARY ANGIOGRAPHY N/A 01/11/2023   Procedure: LEFT HEART CATH AND CORONARY ANGIOGRAPHY;  Surgeon: Kathleene Hazel, MD;  Location: [facility];  Service: Cardiovascular;  Laterality: N/A;   TOTAL KNEE  ARTHROPLASTY     HPI:  77 year old male with history of Parkinson's, frequent falls, CHF, GERD.  Patient with brief episode in hospital where he was not really following directions and concern was present for CVA.  MRI negative for acute CVA and SLP swallow eval as well as cognitive eval ordered.  Patient resides at home with his wife.  Patient has undergone prior esophagram in 2017 that showed hiatal hernia, esophageal dysmotility with tertiary contractions, and intact pharyngeal swallow appeared functional.  Indication for esophagram was patient complained of hoarseness and problems with swallowing.  Patient and wife denied him having significant issues with swallowing they do endorse that it within the last few months he has had difficulty swallowing large pills sensing them sticking in his pharynx (pointing to proximal area).   Assessment / Plan / Recommendation Clinical Impression  Patient was seen by SLP for a cognitive-linguistic evaluation. The patient was awake, alert, and pleasant. The patient's spouse remained in the room duirng this evaluation. The [facility] Mental Examination (SLUMS) was completed to determine the patient's cognitive status. 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 The patient shared that he has increasingly noticed short-term memory deficits over the past 3 months. Areas of difficulty on the SLUMS examination included the spaced recall task and listening comprehension. A potenially confounding factor to account for the patient's lower scores on these tasks may be due to the presence of a hearing deficit and/or tinnitus, both of which the patient stated he has a history of. All other takss were completed with no difficulty. The patient stated that his wife has noticed his vocal intensity has been decreasing recently, likely due to Parkinson's.Given he will be discharging to a  skilled nursing facility following this hosptialization, the patient would benefit from continued speech pathology services at the next venue of care that address deficits in memory and voice. No further ST followup necessary at this time, as both the patient and his wife indicated that swallow functionality has been [facility] on the regular, thin liquids diet.    SLP Assessment  SLP Recommendation/Assessment: All further Speech Lanaguage Pathology  needs can be addressed in the next venue of care SLP Visit Diagnosis: Cognitive communication deficit (R41.841);Dysphagia, unspecified (R13.10)    Recommendations for follow up therapy are one component of a multi-disciplinary discharge planning process, led by the attending physician.  Recommendations may be updated based on patient status, additional functional criteria and insurance authorization.    Follow Up Recommendations  Skilled nursing-short term rehab (<3 hours/day)    Assistance Recommended at Discharge  PRN  Functional Status Assessment Patient has had a recent decline in their functional status and demonstrates the ability to make significant improvements in function in a reasonable and predictable amount of time.  Frequency and Duration           SLP Evaluation Cognition  Overall Cognitive Status: Within Functional Limits  for tasks assessed Arousal/Alertness: Awake/alert Orientation Level: Oriented X4 Memory: Impaired Memory Impairment: Decreased recall of new information;Decreased short term memory Decreased Short Term Memory: Verbal complex;Functional complex Awareness: Appears intact Safety/Judgment: Appears intact       Comprehension  Auditory Comprehension Overall Auditory Comprehension: Appears within functional limits for tasks assessed Yes/No Questions: Within Functional Limits Commands: Not tested Interfering Components: Hearing EffectiveTechniques: Increased volume Visual Recognition/Discrimination Discrimination:  Within Function Limits Reading Comprehension Reading Status: Not tested    Expression Expression Primary Mode of Expression: Verbal Verbal Expression Overall Verbal Expression: Appears within functional limits for tasks assessed Initiation: No impairment Level of Generative/Spontaneous Verbalization: Conversation Naming: No impairment Pragmatics: No impairment   Oral / Motor  Oral Motor/Sensory Function Overall Oral Motor/Sensory Function: Within functional limits Motor Speech Overall Motor Speech: Appears within functional limits for tasks assessed Respiration: Within functional limits Phonation: Low vocal intensity Resonance: Within functional limits Articulation: Within functional limitis Intelligibility: Intelligible Motor Planning: Witnin functional limits Motor Speech Errors: Not applicable     Marline Backbone, Senaida Lange., Speech Therapy Student     07/08/2023, 10:34 AM

## 2023-07-10 ENCOUNTER — Telehealth: Payer: Self-pay | Admitting: Neurology

## 2023-07-10 NOTE — Telephone Encounter (Signed)
Patient was to go to SNF at [facility] and he is refusing and leaving the facility. Patients wife said patient has been using his walker in and out of the house but fell in the shower. Patients wife dealing with the SNF situation at this time

## 2023-07-10 NOTE — Telephone Encounter (Signed)
I hate to hear this.  Noncompliance is really the source of a lot of the issues.  He's unfortunately planning on leaving AMA.  I don't recommend this at all.

## 2023-07-10 NOTE — Telephone Encounter (Signed)
Referral received on patient.  He had a fall.  [place], call pt/wife.  Is he willing to use walker in and OUT of the house?

## 2023-07-11 NOTE — Telephone Encounter (Signed)
Patient is leaving the facility, unhappy, and is needing home therapy or go to at therapy office. And is wanting a referral .

## 2023-07-12 NOTE — Telephone Encounter (Signed)
When he leaves the facility, they should set him up for outpatient therapy.  I'm disappointed to hear he left in patient therapy

## 2023-07-15 ENCOUNTER — Other Ambulatory Visit: Payer: Self-pay

## 2023-07-15 DIAGNOSIS — R2689 Other abnormalities of gait and mobility: Secondary | ICD-10-CM

## 2023-07-15 DIAGNOSIS — W19XXXD Unspecified fall, subsequent encounter: Secondary | ICD-10-CM

## 2023-07-15 DIAGNOSIS — G20B2 Parkinson's disease with dyskinesia, with fluctuations: Secondary | ICD-10-CM

## 2023-07-15 NOTE — Telephone Encounter (Signed)
[facility] has not contacted them and they do not expect them to after the way the patient left te facility. I have put in PT a neuro Rehab and patient says he wants to go

## 2023-07-16 DIAGNOSIS — R1313 Dysphagia, pharyngeal phase: Secondary | ICD-10-CM | POA: Diagnosis not present

## 2023-07-16 DIAGNOSIS — N179 Acute kidney failure, unspecified: Secondary | ICD-10-CM | POA: Diagnosis not present

## 2023-07-16 DIAGNOSIS — N183 Chronic kidney disease, stage 3 unspecified: Secondary | ICD-10-CM | POA: Diagnosis not present

## 2023-07-16 DIAGNOSIS — G2581 Restless legs syndrome: Secondary | ICD-10-CM | POA: Diagnosis not present

## 2023-07-16 DIAGNOSIS — I444 Left anterior fascicular block: Secondary | ICD-10-CM | POA: Diagnosis not present

## 2023-07-16 DIAGNOSIS — D72829 Elevated white blood cell count, unspecified: Secondary | ICD-10-CM | POA: Diagnosis not present

## 2023-07-16 DIAGNOSIS — N3000 Acute cystitis without hematuria: Secondary | ICD-10-CM | POA: Diagnosis not present

## 2023-07-16 DIAGNOSIS — N201 Calculus of ureter: Secondary | ICD-10-CM | POA: Diagnosis not present

## 2023-07-16 DIAGNOSIS — E1122 Type 2 diabetes mellitus with diabetic chronic kidney disease: Secondary | ICD-10-CM | POA: Diagnosis not present

## 2023-07-16 DIAGNOSIS — J309 Allergic rhinitis, unspecified: Secondary | ICD-10-CM | POA: Diagnosis not present

## 2023-07-16 DIAGNOSIS — I2510 Atherosclerotic heart disease of native coronary artery without angina pectoris: Secondary | ICD-10-CM | POA: Diagnosis not present

## 2023-07-16 DIAGNOSIS — M810 Age-related osteoporosis without current pathological fracture: Secondary | ICD-10-CM | POA: Diagnosis not present

## 2023-07-16 DIAGNOSIS — K219 Gastro-esophageal reflux disease without esophagitis: Secondary | ICD-10-CM | POA: Diagnosis not present

## 2023-07-16 DIAGNOSIS — I5041 Acute combined systolic (congestive) and diastolic (congestive) heart failure: Secondary | ICD-10-CM | POA: Diagnosis not present

## 2023-07-16 DIAGNOSIS — E1165 Type 2 diabetes mellitus with hyperglycemia: Secondary | ICD-10-CM | POA: Diagnosis not present

## 2023-07-16 DIAGNOSIS — I130 Hypertensive heart and chronic kidney disease with heart failure and stage 1 through stage 4 chronic kidney disease, or unspecified chronic kidney disease: Secondary | ICD-10-CM | POA: Diagnosis not present

## 2023-07-16 DIAGNOSIS — I872 Venous insufficiency (chronic) (peripheral): Secondary | ICD-10-CM | POA: Diagnosis not present

## 2023-07-16 DIAGNOSIS — M1990 Unspecified osteoarthritis, unspecified site: Secondary | ICD-10-CM | POA: Diagnosis not present

## 2023-07-16 DIAGNOSIS — G20B1 Parkinson's disease with dyskinesia, without mention of fluctuations: Secondary | ICD-10-CM | POA: Diagnosis not present

## 2023-07-16 DIAGNOSIS — E1151 Type 2 diabetes mellitus with diabetic peripheral angiopathy without gangrene: Secondary | ICD-10-CM | POA: Diagnosis not present

## 2023-07-16 DIAGNOSIS — K5900 Constipation, unspecified: Secondary | ICD-10-CM | POA: Diagnosis not present

## 2023-07-22 DIAGNOSIS — N183 Chronic kidney disease, stage 3 unspecified: Secondary | ICD-10-CM | POA: Diagnosis not present

## 2023-07-22 DIAGNOSIS — G2581 Restless legs syndrome: Secondary | ICD-10-CM | POA: Diagnosis not present

## 2023-07-22 DIAGNOSIS — E1122 Type 2 diabetes mellitus with diabetic chronic kidney disease: Secondary | ICD-10-CM | POA: Diagnosis not present

## 2023-07-22 DIAGNOSIS — D72829 Elevated white blood cell count, unspecified: Secondary | ICD-10-CM | POA: Diagnosis not present

## 2023-07-22 DIAGNOSIS — R1313 Dysphagia, pharyngeal phase: Secondary | ICD-10-CM | POA: Diagnosis not present

## 2023-07-22 DIAGNOSIS — N179 Acute kidney failure, unspecified: Secondary | ICD-10-CM | POA: Diagnosis not present

## 2023-07-22 DIAGNOSIS — I5041 Acute combined systolic (congestive) and diastolic (congestive) heart failure: Secondary | ICD-10-CM | POA: Diagnosis not present

## 2023-07-22 DIAGNOSIS — I2510 Atherosclerotic heart disease of native coronary artery without angina pectoris: Secondary | ICD-10-CM | POA: Diagnosis not present

## 2023-07-22 DIAGNOSIS — M1990 Unspecified osteoarthritis, unspecified site: Secondary | ICD-10-CM | POA: Diagnosis not present

## 2023-07-22 DIAGNOSIS — I444 Left anterior fascicular block: Secondary | ICD-10-CM | POA: Diagnosis not present

## 2023-07-22 DIAGNOSIS — M810 Age-related osteoporosis without current pathological fracture: Secondary | ICD-10-CM | POA: Diagnosis not present

## 2023-07-22 DIAGNOSIS — N201 Calculus of ureter: Secondary | ICD-10-CM | POA: Diagnosis not present

## 2023-07-22 DIAGNOSIS — I130 Hypertensive heart and chronic kidney disease with heart failure and stage 1 through stage 4 chronic kidney disease, or unspecified chronic kidney disease: Secondary | ICD-10-CM | POA: Diagnosis not present

## 2023-07-22 DIAGNOSIS — G20B1 Parkinson's disease with dyskinesia, without mention of fluctuations: Secondary | ICD-10-CM | POA: Diagnosis not present

## 2023-07-22 DIAGNOSIS — E1165 Type 2 diabetes mellitus with hyperglycemia: Secondary | ICD-10-CM | POA: Diagnosis not present

## 2023-07-22 DIAGNOSIS — K5900 Constipation, unspecified: Secondary | ICD-10-CM | POA: Diagnosis not present

## 2023-07-22 DIAGNOSIS — J309 Allergic rhinitis, unspecified: Secondary | ICD-10-CM | POA: Diagnosis not present

## 2023-07-22 DIAGNOSIS — K219 Gastro-esophageal reflux disease without esophagitis: Secondary | ICD-10-CM | POA: Diagnosis not present

## 2023-07-22 DIAGNOSIS — I872 Venous insufficiency (chronic) (peripheral): Secondary | ICD-10-CM | POA: Diagnosis not present

## 2023-07-22 DIAGNOSIS — E1151 Type 2 diabetes mellitus with diabetic peripheral angiopathy without gangrene: Secondary | ICD-10-CM | POA: Diagnosis not present

## 2023-07-22 DIAGNOSIS — N3000 Acute cystitis without hematuria: Secondary | ICD-10-CM | POA: Diagnosis not present

## 2023-07-23 DIAGNOSIS — N183 Chronic kidney disease, stage 3 unspecified: Secondary | ICD-10-CM | POA: Diagnosis not present

## 2023-07-23 DIAGNOSIS — R319 Hematuria, unspecified: Secondary | ICD-10-CM | POA: Diagnosis not present

## 2023-07-23 DIAGNOSIS — E1169 Type 2 diabetes mellitus with other specified complication: Secondary | ICD-10-CM | POA: Diagnosis not present

## 2023-07-23 DIAGNOSIS — I5040 Unspecified combined systolic (congestive) and diastolic (congestive) heart failure: Secondary | ICD-10-CM | POA: Diagnosis not present

## 2023-07-23 DIAGNOSIS — G20C Parkinsonism, unspecified: Secondary | ICD-10-CM | POA: Diagnosis not present

## 2023-07-23 DIAGNOSIS — E1122 Type 2 diabetes mellitus with diabetic chronic kidney disease: Secondary | ICD-10-CM | POA: Diagnosis not present

## 2023-07-23 DIAGNOSIS — I130 Hypertensive heart and chronic kidney disease with heart failure and stage 1 through stage 4 chronic kidney disease, or unspecified chronic kidney disease: Secondary | ICD-10-CM | POA: Diagnosis not present

## 2023-07-23 DIAGNOSIS — W19XXXA Unspecified fall, initial encounter: Secondary | ICD-10-CM | POA: Diagnosis not present

## 2023-07-23 DIAGNOSIS — S2239XA Fracture of one rib, unspecified side, initial encounter for closed fracture: Secondary | ICD-10-CM | POA: Diagnosis not present

## 2023-07-25 DIAGNOSIS — I444 Left anterior fascicular block: Secondary | ICD-10-CM | POA: Diagnosis not present

## 2023-07-25 DIAGNOSIS — N201 Calculus of ureter: Secondary | ICD-10-CM | POA: Diagnosis not present

## 2023-07-25 DIAGNOSIS — K5900 Constipation, unspecified: Secondary | ICD-10-CM | POA: Diagnosis not present

## 2023-07-25 DIAGNOSIS — D72829 Elevated white blood cell count, unspecified: Secondary | ICD-10-CM | POA: Diagnosis not present

## 2023-07-25 DIAGNOSIS — N3000 Acute cystitis without hematuria: Secondary | ICD-10-CM | POA: Diagnosis not present

## 2023-07-25 DIAGNOSIS — E1151 Type 2 diabetes mellitus with diabetic peripheral angiopathy without gangrene: Secondary | ICD-10-CM | POA: Diagnosis not present

## 2023-07-25 DIAGNOSIS — E1165 Type 2 diabetes mellitus with hyperglycemia: Secondary | ICD-10-CM | POA: Diagnosis not present

## 2023-07-25 DIAGNOSIS — G20B1 Parkinson's disease with dyskinesia, without mention of fluctuations: Secondary | ICD-10-CM | POA: Diagnosis not present

## 2023-07-25 DIAGNOSIS — I2510 Atherosclerotic heart disease of native coronary artery without angina pectoris: Secondary | ICD-10-CM | POA: Diagnosis not present

## 2023-07-25 DIAGNOSIS — E1122 Type 2 diabetes mellitus with diabetic chronic kidney disease: Secondary | ICD-10-CM | POA: Diagnosis not present

## 2023-07-25 DIAGNOSIS — M1990 Unspecified osteoarthritis, unspecified site: Secondary | ICD-10-CM | POA: Diagnosis not present

## 2023-07-25 DIAGNOSIS — N183 Chronic kidney disease, stage 3 unspecified: Secondary | ICD-10-CM | POA: Diagnosis not present

## 2023-07-25 DIAGNOSIS — N179 Acute kidney failure, unspecified: Secondary | ICD-10-CM | POA: Diagnosis not present

## 2023-07-25 DIAGNOSIS — R1313 Dysphagia, pharyngeal phase: Secondary | ICD-10-CM | POA: Diagnosis not present

## 2023-07-25 DIAGNOSIS — G2581 Restless legs syndrome: Secondary | ICD-10-CM | POA: Diagnosis not present

## 2023-07-25 DIAGNOSIS — K219 Gastro-esophageal reflux disease without esophagitis: Secondary | ICD-10-CM | POA: Diagnosis not present

## 2023-07-25 DIAGNOSIS — I5041 Acute combined systolic (congestive) and diastolic (congestive) heart failure: Secondary | ICD-10-CM | POA: Diagnosis not present

## 2023-07-25 DIAGNOSIS — I872 Venous insufficiency (chronic) (peripheral): Secondary | ICD-10-CM | POA: Diagnosis not present

## 2023-07-25 DIAGNOSIS — M810 Age-related osteoporosis without current pathological fracture: Secondary | ICD-10-CM | POA: Diagnosis not present

## 2023-07-25 DIAGNOSIS — I130 Hypertensive heart and chronic kidney disease with heart failure and stage 1 through stage 4 chronic kidney disease, or unspecified chronic kidney disease: Secondary | ICD-10-CM | POA: Diagnosis not present

## 2023-07-25 DIAGNOSIS — J309 Allergic rhinitis, unspecified: Secondary | ICD-10-CM | POA: Diagnosis not present

## 2023-07-29 ENCOUNTER — Ambulatory Visit: Payer: Medicare Other | Admitting: Physical Therapy

## 2023-07-30 ENCOUNTER — Ambulatory Visit: Payer: Medicare Other | Attending: Neurology | Admitting: Physical Therapy

## 2023-07-30 ENCOUNTER — Telehealth: Payer: Self-pay | Admitting: Physical Therapy

## 2023-07-30 DIAGNOSIS — Z9181 History of falling: Secondary | ICD-10-CM | POA: Insufficient documentation

## 2023-07-30 DIAGNOSIS — R278 Other lack of coordination: Secondary | ICD-10-CM | POA: Diagnosis not present

## 2023-07-30 DIAGNOSIS — W19XXXA Unspecified fall, initial encounter: Secondary | ICD-10-CM | POA: Diagnosis not present

## 2023-07-30 DIAGNOSIS — R2681 Unsteadiness on feet: Secondary | ICD-10-CM | POA: Insufficient documentation

## 2023-07-30 DIAGNOSIS — G20B2 Parkinson's disease with dyskinesia, with fluctuations: Secondary | ICD-10-CM | POA: Insufficient documentation

## 2023-07-30 DIAGNOSIS — M6281 Muscle weakness (generalized): Secondary | ICD-10-CM | POA: Diagnosis not present

## 2023-07-30 DIAGNOSIS — R29818 Other symptoms and signs involving the nervous system: Secondary | ICD-10-CM | POA: Diagnosis not present

## 2023-07-30 DIAGNOSIS — R2689 Other abnormalities of gait and mobility: Secondary | ICD-10-CM | POA: Insufficient documentation

## 2023-07-30 DIAGNOSIS — R293 Abnormal posture: Secondary | ICD-10-CM | POA: Insufficient documentation

## 2023-07-30 DIAGNOSIS — G20A1 Parkinson's disease without dyskinesia, without mention of fluctuations: Secondary | ICD-10-CM

## 2023-07-30 NOTE — Telephone Encounter (Signed)
Dr. Vinton Callas. Wimer was evaluated by PT on 07/30/23.  The patient would benefit from an OT and SLP evaluation for PD.    If you agree, please place an order in [facility]que in [facility] or fax the order to [phone].  Thank you, Josephine Igo, PT, DPT [facility] [address] [place], [place]  [address] Phone:  [phone] Fax:  [phone]

## 2023-07-30 NOTE — Therapy (Signed)
OUTPATIENT PHYSICAL THERAPY NEURO EVALUATION   Patient [name] MRN: [id number] DOB:07-17-1946, 77 y.o., male Today's Date: 07/30/2023   PCP: Emilio Aspen, MD REFERRING PROVIDER: Vladimir Faster, DO  END OF SESSION:  PT End of Session - 07/30/23 1109     Visit Number 1    Number of Visits 17   Plus eval   Date for PT Re-Evaluation 10/08/23    Authorization Type [employer] Medicare    PT Start Time 1107   Pt arrived late   PT Stop Time 1145    PT Time Calculation (min) 38 min    Activity Tolerance Patient tolerated treatment well    Behavior During Therapy WFL for tasks assessed/performed             Past Medical History:  Diagnosis Date   Abnormal involuntary movement 02/18/2018   Allergic rhinitis    Arthritis    BPH (benign prostatic hyperplasia)    Diabetes (HCC)    Dysphagia, pharyngeal phase    GERD diagnosed on barium swallow. Has small hiatal hernia. Symptomatically somewhat better on omeprazole but not entirely. We'll try b.i.d. therapy   Edema    1+ in both ankles, likely multifactorial including medication such as Requip   Erectile dysfunction    Staxyn 10 mg or Viagra worked well. 3 samples of Cialis 20 mg provided   GERD (gastroesophageal reflux disease)    Hypercholesteremia    Hypertension    Nephrolithiasis    Onychomycosis of toenail    April 27, 2013 - Dr. Merwyn Katos - podiatry, was in [place] - treating with oral Lamisil and topical nail therapy   Parkinson's disease     followed by Dr. Raquel Sarna at [facility] and Lesia Sago, M.D. in [place]   Presbycusis    and tinnitus - Dr. Serena Colonel - August/2013   Renal calculus    Syncope    Past Surgical History:  Procedure Laterality Date   CORONARY STENT INTERVENTION N/A 01/11/2023   Procedure: CORONARY STENT INTERVENTION;  Surgeon: Kathleene Hazel, MD;  Location: [facility];  Service: Cardiovascular;  Laterality: N/A;   HAND SURGERY     INGUINAL HERNIA REPAIR Left  11/04/2015   Procedure: LEFT INGUINAL HERNIA REPAIR WITH MESH;  Surgeon: Darnell Level, MD;  Location: [facility];  Service: General;  Laterality: Left;   INSERTION OF MESH Left 11/04/2015   Procedure: INSERTION OF MESH;  Surgeon: Darnell Level, MD;  Location: [facility];  Service: General;  Laterality: Left;   JOINT REPLACEMENT Bilateral    KNEE SURGERY     LEFT HEART CATH AND CORONARY ANGIOGRAPHY N/A 01/11/2023   Procedure: LEFT HEART CATH AND CORONARY ANGIOGRAPHY;  Surgeon: Kathleene Hazel, MD;  Location: [facility];  Service: Cardiovascular;  Laterality: N/A;   TOTAL KNEE ARTHROPLASTY     Patient Active Problem List   Diagnosis Date Noted   Gross hematuria 07/03/2023   Depression 06/24/2023   Chemosis 06/24/2023   Elevated troponin 04/03/2023   Sepsis (HCC) 04/01/2023   Coronary artery disease involving native coronary 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 angina pectoris 01/11/2023   Chronic combined systolic and diastolic heart failure (HCC)    Dysphagia 04/29/2022   AKI (acute kidney injury) (HCC)    Dehydration    Failure to thrive (child)    Bacteriuria, asymptomatic    Failure to thrive in adult 04/20/2022   Malaise, possible ESBL Klebsiella UTI 04/20/2022  Chronic retention of urine 04/20/2022   Closed lumbar vertebral fracture (HCC) 04/20/2022   UTI (urinary tract infection) 03/01/2022   Leukocytosis 01/01/2022   Hyponatremia 01/01/2022   CKD (chronic kidney disease), stage IIIa (HCC) 01/01/2022   HTN (hypertension) 12/31/2021   History of ESBL Klebsiella pneumoniae infection 12/31/2021   Generalized weakness 12/31/2021   Chronic venous insufficiency 08/29/2021   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   Encounter for general adult medical examination with abnormal findings 02/18/2018   Enlarged prostate 02/18/2018   Hearing loss 02/18/2018   Other long term (current) drug therapy 02/18/2018   Type 2 diabetes mellitus with  hyperglycemia (HCC) 02/18/2018   HLD (hyperlipidemia)    Nephrolithiasis    Parkinson disease (HCC)    Diabetes mellitus with coincident hypertension (HCC)    Syncope    Renal calculus    Diabetes (HCC)    Allergic rhinitis    BPH (benign prostatic hyperplasia)    Erectile dysfunction    Dysphagia, pharyngeal phase    Edema    Ureteral calculus 02/09/2013   LEG PAIN 03/14/2010    ONSET DATE: 07/15/2023 (referral)   REFERRING DIAG: G20.B2 (ICD-10-CM) - Parkinson's disease with dyskinesia and fluctuating manifestations (HCC) R26.89 (ICD-10-CM) - Balance problem W19.XXXD (ICD-10-CM) - Fall, subsequent encounter  THERAPY DIAG:  Other abnormalities of gait and mobility  Unsteadiness on feet  Muscle weakness (generalized)  History of falling  Abnormal posture  Rationale for Evaluation and Treatment: Rehabilitation  SUBJECTIVE:                                                                                                                                                                                             SUBJECTIVE STATEMENT: Pt ambulated into clinic w/SPC, very unsteady and relying on furniture walking to stabilize. Reports his RW is in the "junk pile" at home. States he fell backwards while drying off after a shower and broke his rib, was sent to a SNF and he "got tired of lying there for 23.7 hours and working for 0.3" so he left. Per wife, they had to ask pt to receive therapy at the SNF. Pt reports he likes to work and then [employer] the grass, runs a golf course in [place]. Has had several falls this year, one while getting out of the car and a couple in the house when he let the walker get away from him. Falls frequently while going through doorways. Wife reports pt uses the RW at home but not in the community. Rides the recumbent bike for about 20 minutes at home and walks some. Pt reports he is still  driving, "I cleared myself to drive". Harriett Sine states pt has had pretty  consistent HHPT through [facility] this year due to multiple hospital visits.   Pt accompanied by:  Wife, Harriett Sine  PERTINENT HISTORY: HTN, chronic bilateral lower extremity edema, GERD, HLD, BPH, parkinson's disease  PAIN:  Are you having pain? No  PRECAUTIONS: Fall  RED FLAGS: None   WEIGHT BEARING RESTRICTIONS: No  FALLS: Has patient fallen in last 6 months? Yes. Number of falls Multiple- unsure the number   LIVING ENVIRONMENT: Lives with: lives with their spouse Lives in: House/apartment - pt reports he lives in the basement due to a fall down the steps last year that resulted in spinal fx  Stairs: Yes: Internal: full flight steps; on right going up Has following equipment at home: Single point cane, Walker - 2 wheeled, Environmental consultant - 4 wheeled, shower chair, and Grab bars  PLOF: Requires assistive device for independence, Needs assistance with ADLs, and Needs assistance with homemaking  PATIENT GOALS: "to improve balance and strength"   OBJECTIVE:  Note: Objective measures were completed at Evaluation unless otherwise noted.  DIAGNOSTIC FINDINGS: MRI of brain on 07/05/23   IMPRESSION: 1. No acute intracranial abnormality. 2. Mild for age signal changes in the cerebral white matter and a solitary chronic microhemorrhage in the right midbrain most compatible with chronic small vessel disease.  CT of chest on 07/03/23  IMPRESSION: 1. Minimally displaced left sixth rib fracture, without pneumothorax or effusion. 2. Healing left seventh and eighth rib fractures. 3. Cholelithiasis. 4. Left nephrolithiasis without hydronephrosis. 5. Chronic L1 compression fracture deformity. 6. Coronary and aortic Atherosclerosis (ICD10-I70.0).  COGNITION: Overall cognitive status: Impaired and extremely poor insight into deficits    SENSATION: Pt denies numbness/tingling    EDEMA: Chronic BLE edema    POSTURE: rounded shoulders, forward head, increased thoracic kyphosis, and weight  shift right  LOWER EXTREMITY MMT:    MMT Right Eval Left Eval  Hip flexion    Hip extension    Hip abduction    Hip adduction    Hip internal rotation    Hip external rotation    Knee flexion    Knee extension    Ankle dorsiflexion    Ankle plantarflexion    Ankle inversion    Ankle eversion    (Blank rows = not tested)  BED MOBILITY:  Independent per pt   TRANSFERS: Assistive device utilized: Single point cane  Sit to stand: CGA Stand to sit: CGA Pt required min A and mod verbal cues to fully turn and back up prior to sitting in chair, as he almost missed chair when walking into evaluation room. Heavy reliance on BUE support w/poor anterior weight shift. Unable to obtain full hip/knee extension in stance   GAIT: Gait pattern: step through pattern, decreased arm swing- Right, decreased arm swing- Left, decreased stride length, decreased hip/knee flexion- Right, decreased hip/knee flexion- Left, decreased ankle dorsiflexion- Right, decreased ankle dorsiflexion- Left, shuffling, lateral hip instability, decreased trunk rotation, trunk flexed, and poor foot clearance- Right Distance walked: Various clinic distances  Assistive device utilized: Single point cane Level of assistance: CGA and Min A Comments: Pt relies on furniture walking to stabilize, frequently carrying cane on R side rather than use it. Pt very unsteady w/turns, losing balance posteriorly. Also noted freezing when ambulating through doorways, which pt denies.   FUNCTIONAL TESTS:   [facility] PT Assessment - 07/30/23 1132       Transfers   Five time sit to stand  comments  13.41s   BUE support, poor anterior weight shift, crouched posture     Ambulation/Gait   Gait velocity 32.8' over 12.18s = 2.7 ft/s   w/SPC and min A            TODAY'S TREATMENT:   Next Session                                                                                                                                PATIENT  EDUCATION: Education details: POC, eval findings, Plan to obtain SLP and OT referrals, EXTENSIVE education on the need to use RW AT ALL TIMES and pt is not safe to drive. Pt refusing to look at therapist or acknowledge education provided  Person educated: Patient and Spouse Education method: Explanation Education comprehension: needs further education  HOME EXERCISE PROGRAM: To be reviewed from previous POC (will likely need seated PWR)   GOALS: Goals reviewed with patient? Yes  SHORT TERM GOALS: Target date: 08/27/2023   Pt will perform initial HEP w/CGA from caregiver for improved strength, balance, transfers and gait.  Baseline: not established on eval  Goal status: INITIAL  2.  Pt will improve 5 x STS to less than or equal to 15 seconds w/proper body mechanics to demonstrate improved functional strength and transfer efficiency.   Baseline: 13.41s w/BUE support and improper body mechanics  Goal status: INITIAL  3.  Pt will be compliant w/use of RW at all times for reduced fall frequency and independence  Baseline: Pt not using  Goal status: INITIAL  4.  Pt and wife will verbalize understanding of freezing strategies to implement at home and in community for reduced fall frequency.   Baseline:  Goal status: INITIAL  5.  Normal TUG to be assessed and STG/LTG updated  Baseline:  Goal status: INITIAL    LONG TERM GOALS: Target date: 09/24/2023   Pt will perform final HEP w/SBA from caregiver for improved strength, balance, transfers and gait.  Baseline:  Goal status: INITIAL  2.  Pt will improve gait velocity to at least 2.9 ft/s w/LRAD mod I for improved gait efficiency and independence  Baseline: 2.7 ft/s w/SPC and CGA-min A Goal status: INITIAL  3.  Pt will improve 5 x STS to less than or equal to 13 seconds w/proper body mechanics to demonstrate improved functional strength and transfer efficiency.   Baseline: 13.41s w/BUE support and improper body mechanics   Goal status: INITIAL  4.  Normal TUG goal  Baseline:  Goal status: INITIAL  5.  Pt will verbalize understanding of local PD community resources, including fitness post DC.   Baseline:  Goal status: INITIAL   ASSESSMENT:  CLINICAL IMPRESSION: Patient is a 77 year old male referred to Neuro OPPT for PD.   Pt's PMH is significant for: HTN, chronic bilateral lower extremity edema, GERD, HLD, BPH, parkinson's disease. The following deficits were present during the exam: impaired balance, decreased  safety awareness, decreased functional strength, freezing of gait, bradykinesia, dyskinesias and poor compliance to use of AD. Based on falls history, noncompliance w/AD use and PD, pt is a high fall risk. Pt would benefit from skilled PT to address these impairments and functional limitations to maximize functional mobility independence.    OBJECTIVE IMPAIRMENTS: Abnormal gait, decreased balance, decreased cognition, decreased knowledge of condition, decreased knowledge of use of DME, decreased mobility, difficulty walking, decreased strength, decreased safety awareness, impaired perceived functional ability, improper body mechanics, and pain  ACTIVITY LIMITATIONS: carrying, lifting, bending, standing, squatting, stairs, transfers, reach over head, hygiene/grooming, and locomotion level  PARTICIPATION LIMITATIONS: meal prep, cleaning, laundry, medication management, interpersonal relationship, driving, shopping, community activity, occupation, and yard work  PERSONAL FACTORS: Age, Behavior pattern, Fitness, Past/current experiences, Transportation, and 1 comorbidity: PD  are also affecting patient's functional outcome.   REHAB POTENTIAL: Fair due to poor compliance w/therapy recommendations  CLINICAL DECISION MAKING: Evolving/moderate complexity  EVALUATION COMPLEXITY: Moderate  PLAN:  PT FREQUENCY: 2x/week  PT DURATION: 8 weeks (POC written for 10 weeks due to delay in scheduling)    PLANNED INTERVENTIONS: 44010- PT Re-evaluation, 97110-Therapeutic exercises, 97530- Therapeutic activity, 97112- Neuromuscular re-education, 97535- Self Care, 27253- Manual therapy, [phone]- Gait training, Balance training, Stair training, and DME instructions  PLAN FOR NEXT SESSION: Assess TUG and update goal. Did we get SLP/OT referrals? Trial U-step. Extensive education needed on safety at home. Establish HEP for improved transfers, posture    Maliek Schellhorn E Dereonna Lensing, PT, DPT 07/30/2023, 11:55 AM

## 2023-08-05 ENCOUNTER — Ambulatory Visit: Payer: Medicare Other | Admitting: Physical Therapy

## 2023-08-05 VITALS — BP 137/74 | HR 63

## 2023-08-05 DIAGNOSIS — M6281 Muscle weakness (generalized): Secondary | ICD-10-CM

## 2023-08-05 DIAGNOSIS — R2681 Unsteadiness on feet: Secondary | ICD-10-CM

## 2023-08-05 DIAGNOSIS — G20B2 Parkinson's disease with dyskinesia, with fluctuations: Secondary | ICD-10-CM | POA: Diagnosis not present

## 2023-08-05 DIAGNOSIS — R2689 Other abnormalities of gait and mobility: Secondary | ICD-10-CM

## 2023-08-05 DIAGNOSIS — R29818 Other symptoms and signs involving the nervous system: Secondary | ICD-10-CM | POA: Diagnosis not present

## 2023-08-05 DIAGNOSIS — Z9181 History of falling: Secondary | ICD-10-CM

## 2023-08-05 DIAGNOSIS — R278 Other lack of coordination: Secondary | ICD-10-CM | POA: Diagnosis not present

## 2023-08-05 DIAGNOSIS — R293 Abnormal posture: Secondary | ICD-10-CM | POA: Diagnosis not present

## 2023-08-05 NOTE — Therapy (Signed)
OUTPATIENT PHYSICAL THERAPY NEURO TREATMENT   Patient Name: CIPRIANO HESCOCK MRN: [id number] DOB:October 23, 1945, 77 y.o., male Today's Date: 08/05/2023   PCP: Emilio Aspen, MD REFERRING PROVIDER: Vladimir Faster, DO  END OF SESSION:  PT End of Session - 08/05/23 1318     Visit Number 2    Number of Visits 17   Plus eval   Date for PT Re-Evaluation 10/08/23    Authorization Type [employer] Medicare    PT Start Time 1316    PT Stop Time 1404    PT Time Calculation (min) 48 min    Equipment Utilized During Treatment Gait belt    Activity Tolerance Patient tolerated treatment well    Behavior During Therapy WFL for tasks assessed/performed;Impulsive              Past Medical History:  Diagnosis Date   Abnormal involuntary movement 02/18/2018   Allergic rhinitis    Arthritis    BPH (benign prostatic hyperplasia)    Diabetes (HCC)    Dysphagia, pharyngeal phase    GERD diagnosed on barium swallow. Has small hiatal hernia. Symptomatically somewhat better on omeprazole but not entirely. We'll try b.i.d. therapy   Edema    1+ in both ankles, likely multifactorial including medication such as Requip   Erectile dysfunction    Staxyn 10 mg or Viagra worked well. 3 samples of Cialis 20 mg provided   GERD (gastroesophageal reflux disease)    Hypercholesteremia    Hypertension    Nephrolithiasis    Onychomycosis of toenail    April 27, 2013 - Dr. Merwyn Katos - podiatry, was in [place] - treating with oral Lamisil and topical nail therapy   Parkinson's disease     followed by Dr. Raquel Sarna at [facility] and Lesia Sago, M.D. in [place]   Presbycusis    and tinnitus - Dr. Serena Colonel - August/2013   Renal calculus    Syncope    Past Surgical History:  Procedure Laterality Date   CORONARY STENT INTERVENTION N/A 01/11/2023   Procedure: CORONARY STENT INTERVENTION;  Surgeon: Kathleene Hazel, MD;  Location: [facility];  Service: Cardiovascular;  Laterality: N/A;   HAND  SURGERY     INGUINAL HERNIA REPAIR Left 11/04/2015   Procedure: LEFT INGUINAL HERNIA REPAIR WITH MESH;  Surgeon: Darnell Level, MD;  Location: [facility];  Service: General;  Laterality: Left;   INSERTION OF MESH Left 11/04/2015   Procedure: INSERTION OF MESH;  Surgeon: Darnell Level, MD;  Location: [facility];  Service: General;  Laterality: Left;   JOINT REPLACEMENT Bilateral    KNEE SURGERY     LEFT HEART CATH AND CORONARY ANGIOGRAPHY N/A 01/11/2023   Procedure: LEFT HEART CATH AND CORONARY ANGIOGRAPHY;  Surgeon: Kathleene Hazel, MD;  Location: [facility];  Service: Cardiovascular;  Laterality: N/A;   TOTAL KNEE ARTHROPLASTY     Patient Active Problem List   Diagnosis Date Noted   Gross hematuria 07/03/2023   Depression 06/24/2023   Chemosis 06/24/2023   Elevated troponin 04/03/2023   Sepsis (HCC) 04/01/2023   Coronary artery disease involving native coronary 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 angina pectoris 01/11/2023   Chronic combined systolic and diastolic heart failure (HCC)    Dysphagia 04/29/2022   AKI (acute kidney injury) (HCC)    Dehydration    Failure to thrive (child)    Bacteriuria, asymptomatic    Failure to thrive in adult 04/20/2022   Malaise,  possible ESBL Klebsiella UTI 04/20/2022   Chronic retention of urine 04/20/2022   Closed lumbar vertebral fracture (HCC) 04/20/2022   UTI (urinary tract infection) 03/01/2022   Leukocytosis 01/01/2022   Hyponatremia 01/01/2022   CKD (chronic kidney disease), stage IIIa (HCC) 01/01/2022   HTN (hypertension) 12/31/2021   History of ESBL Klebsiella pneumoniae infection 12/31/2021   Generalized weakness 12/31/2021   Chronic venous insufficiency 08/29/2021   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   Encounter for general adult medical examination with abnormal findings 02/18/2018   Enlarged prostate 02/18/2018   Hearing loss 02/18/2018   Other long term (current) drug therapy  02/18/2018   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) 02/18/2018   HLD (hyperlipidemia)    Nephrolithiasis    Parkinson disease (HCC)    Diabetes mellitus with coincident hypertension (HCC)    Syncope    Renal calculus    Diabetes (HCC)    Allergic rhinitis    BPH (benign prostatic hyperplasia)    Erectile dysfunction    Dysphagia, pharyngeal phase    Edema    Ureteral calculus 02/09/2013   LEG PAIN 03/14/2010    ONSET DATE: 07/15/2023 (referral)   REFERRING DIAG: G20.B2 (ICD-10-CM) - Parkinson's disease with dyskinesia and fluctuating manifestations (HCC) R26.89 (ICD-10-CM) - Balance problem W19.XXXD (ICD-10-CM) - Fall, subsequent encounter  THERAPY DIAG:  Other abnormalities of gait and mobility  Unsteadiness on feet  Muscle weakness (generalized)  History of falling  Rationale for Evaluation and Treatment: Rehabilitation  SUBJECTIVE:                                                                                                                                                                                             SUBJECTIVE STATEMENT: Pt ambulated into clinic w/SPC, reaching for walls of clinic to stabilize. Denies falls or acute changes.   Pt accompanied by:  Wife, Harriett Sine (in lobby)  PERTINENT HISTORY: HTN, chronic bilateral lower extremity edema, GERD, HLD, BPH, parkinson's disease  PAIN:  Are you having pain? No  PRECAUTIONS: Fall  RED FLAGS: None   WEIGHT BEARING RESTRICTIONS: No  FALLS: Has patient fallen in last 6 months? Yes. Number of falls Multiple- unsure the number   LIVING ENVIRONMENT: Lives with: lives with their spouse Lives in: House/apartment - pt reports he lives in the basement due to a fall down the steps last year that resulted in spinal fx  Stairs: Yes: Internal: full flight steps; on right going up Has following equipment at home: Single point cane, Walker - 2 wheeled, Environmental consultant - 4 wheeled, shower chair, and Grab  bars  PLOF: Requires assistive  device for independence, Needs assistance with ADLs, and Needs assistance with homemaking  PATIENT GOALS: "to improve balance and strength"   OBJECTIVE:  Note: Objective measures were completed at Evaluation unless otherwise noted.  DIAGNOSTIC FINDINGS: MRI of brain on 07/05/23   IMPRESSION: 1. No acute intracranial abnormality. 2. Mild for age signal changes in the cerebral white matter and a solitary chronic microhemorrhage in the right midbrain most compatible with chronic small vessel disease.  CT of chest on 07/03/23  IMPRESSION: 1. Minimally displaced left sixth rib fracture, without pneumothorax or effusion. 2. Healing left seventh and eighth rib fractures. 3. Cholelithiasis. 4. Left nephrolithiasis without hydronephrosis. 5. Chronic L1 compression fracture deformity. 6. Coronary and aortic Atherosclerosis (ICD10-I70.0).  COGNITION: Overall cognitive status: Impaired and extremely poor insight into deficits    SENSATION: Pt denies numbness/tingling    EDEMA: Chronic BLE edema    POSTURE: rounded shoulders, forward head, increased thoracic kyphosis, and weight shift right  LOWER EXTREMITY MMT:    MMT Right Eval Left Eval  Hip flexion    Hip extension    Hip abduction    Hip adduction    Hip internal rotation    Hip external rotation    Knee flexion    Knee extension    Ankle dorsiflexion    Ankle plantarflexion    Ankle inversion    Ankle eversion    (Blank rows = not tested)  BED MOBILITY:  Independent per pt   TRANSFERS: Assistive device utilized: Single point cane  Sit to stand: CGA Stand to sit: CGA Pt required min A and mod verbal cues to fully turn and back up prior to sitting in chair, as he almost missed chair when walking into evaluation room. Heavy reliance on BUE support w/poor anterior weight shift. Unable to obtain full hip/knee extension in stance  VITALS  Vitals:   08/05/23 1323  BP: 137/74   Pulse: 63     TODAY'S TREATMENT:    Ther Act Assessed vitals (see above) and within limits for therapy   [facility] PT Assessment - 08/05/23 1336       Balance   Balance Assessed Yes      Standardized Balance Assessment   Standardized Balance Assessment Timed Up and Go Test      Timed Up and Go Test   Normal TUG (seconds) 15.68   w/SPC, significant freezing, min guard   TUG Comments 27.97s w/RW   Difficulty coordinating RW           Pt w/significant freezing w/turns and in narrow spaces this date. Noted very poor sequencing of turns and AD management.   Gait Training  Gait pattern: step through pattern, decreased stride length, decreased hip/knee flexion- Right, decreased hip/knee flexion- Left, decreased ankle dorsiflexion- Right, decreased ankle dorsiflexion- Left, shuffling, trunk flexed, poor foot clearance- Right, and poor foot clearance- Left Distance walked: 115' Assistive device utilized:  U-step Level of assistance: CGA Comments: Trialed use of U-step walker around gym as pt unsafe w/cane, RW and SPC. Noted increased step length/clearance w/use of U-step and improved sequencing of turns. At this time, strongly recommend pt use U-step for fall prevention and improved independence. Pt reports he would like to think about it   Ther Ex  SciFit multi-peaks level 8 for 8 minutes using BUE/BLEs for neural priming for reciprocal movement, dynamic cardiovascular warmup and increased amplitude of stepping. Pt required min guard to safely sit on seat due to impulsivity and throwing cane to side of  bike prior to turning to sit. Pt leaning to L throughout activity.   GAIT: Gait pattern: step through pattern, decreased arm swing- Right, decreased arm swing- Left, decreased stride length, decreased hip/knee flexion- Right, decreased hip/knee flexion- Left, decreased ankle dorsiflexion- Right, decreased ankle dorsiflexion- Left, shuffling, lateral hip instability, decreased trunk  rotation, trunk flexed, and poor foot clearance- Right Distance walked: Various clinic distances  Assistive device utilized: Single point cane Level of assistance: CGA and Min A Comments: Pt relies on furniture walking to stabilize, frequently carrying cane on R side rather than use it. Pt very unsteady w/turns, losing balance posteriorly. Also noted freezing when ambulating through doorways.                                                                                                     PATIENT EDUCATION: Education details: Importance of safety w/ambulation, encouragement to obtain U-step  Person educated: Patient Education method: Explanation Education comprehension: needs further education  HOME EXERCISE PROGRAM: To be reviewed from previous POC (will likely need seated PWR)   GOALS: Goals reviewed with patient? Yes  SHORT TERM GOALS: Target date: 08/27/2023   Pt will perform initial HEP w/CGA from caregiver for improved strength, balance, transfers and gait.  Baseline: not established on eval  Goal status: INITIAL  2.  Pt will improve 5 x STS to less than or equal to 15 seconds w/proper body mechanics to demonstrate improved functional strength and transfer efficiency.   Baseline: 13.41s w/BUE support and improper body mechanics  Goal status: INITIAL  3.  Pt will be compliant w/use of RW at all times for reduced fall frequency and independence  Baseline: Pt not using  Goal status: INITIAL  4.  Pt and wife will verbalize understanding of freezing strategies to implement at home and in community for reduced fall frequency.   Baseline:  Goal status: INITIAL  5.  Pt will improve normal TUG to less than or equal to 15 seconds w/safest AD and CGA for improved functional mobility and decreased fall risk.  Baseline: 15.68s w/SPC w/min A Goal status: REVISED     LONG TERM GOALS: Target date: 09/24/2023   Pt will perform final HEP w/SBA from caregiver for improved  strength, balance, transfers and gait.  Baseline:  Goal status: INITIAL  2.  Pt will improve gait velocity to at least 2.9 ft/s w/LRAD mod I for improved gait efficiency and independence  Baseline: 2.7 ft/s w/SPC and CGA-min A Goal status: INITIAL  3.  Pt will improve 5 x STS to less than or equal to 13 seconds w/proper body mechanics to demonstrate improved functional strength and transfer efficiency.   Baseline: 13.41s w/BUE support and improper body mechanics  Goal status: INITIAL  4.  Pt will improve normal TUG to less than or equal to 13 seconds w/safest AD and SBA for improved functional mobility and decreased fall risk.  Baseline: 15.68s w/SPC w/min A Goal status: REVISED   5.  Pt will verbalize understanding of local PD community resources, including fitness post DC.   Baseline:  Goal status:  INITIAL   ASSESSMENT:  CLINICAL IMPRESSION: Emphasis of skilled PT session on assessing TUG, gait training w/U-step and pt education on safety. Pt ambulated into clinic w/SPC and lost balance to L side when ambulating through clinic door, requiring min A to stabilize. Pt continues to refuse to use RW and upon assessing pt w/RW, pt unable to manage AD well or turn with it. At this time, pt is safest w/use of U-step walker and expressed interest in purchasing one this date, but given significant history of noncompliance, unsure if pt would use it. Pt very limited by freezing of gait w/turns and in narrow spaces and has poor freezing strategies. Continue POC.    OBJECTIVE IMPAIRMENTS: Abnormal gait, decreased balance, decreased cognition, decreased knowledge of condition, decreased knowledge of use of DME, decreased mobility, difficulty walking, decreased strength, decreased safety awareness, impaired perceived functional ability, improper body mechanics, and pain  ACTIVITY LIMITATIONS: carrying, lifting, bending, standing, squatting, stairs, transfers, reach over head, hygiene/grooming,  and locomotion level  PARTICIPATION LIMITATIONS: meal prep, cleaning, laundry, medication management, interpersonal relationship, driving, shopping, community activity, occupation, and yard work  PERSONAL FACTORS: Age, Behavior pattern, Fitness, Past/current experiences, Transportation, and 1 comorbidity: PD  are also affecting patient's functional outcome.   REHAB POTENTIAL: Fair due to poor compliance w/therapy recommendations  CLINICAL DECISION MAKING: Evolving/moderate complexity  EVALUATION COMPLEXITY: Moderate  PLAN:  PT FREQUENCY: 2x/week  PT DURATION: 8 weeks (POC written for 10 weeks due to delay in scheduling)   PLANNED INTERVENTIONS: 40981- PT Re-evaluation, 97110-Therapeutic exercises, 97530- Therapeutic activity, 97112- Neuromuscular re-education, 97535- Self Care, 19147- Manual therapy, 82956- Gait training, Balance training, Stair training, and DME instructions  PLAN FOR NEXT SESSION: Continue to use U-step. Extensive education needed on safety at home. Establish HEP for improved transfers, posture. Freezing techniques    Jill Alexanders Tashianna Broome, PT, DPT 08/05/2023, 3:03 PM

## 2023-08-08 ENCOUNTER — Other Ambulatory Visit: Payer: Self-pay

## 2023-08-08 ENCOUNTER — Telehealth: Payer: Self-pay | Admitting: Physical Therapy

## 2023-08-08 ENCOUNTER — Ambulatory Visit: Payer: Medicare Other | Admitting: Physical Therapy

## 2023-08-08 VITALS — BP 117/62 | HR 64

## 2023-08-08 DIAGNOSIS — R29818 Other symptoms and signs involving the nervous system: Secondary | ICD-10-CM | POA: Diagnosis not present

## 2023-08-08 DIAGNOSIS — G20B2 Parkinson's disease with dyskinesia, with fluctuations: Secondary | ICD-10-CM

## 2023-08-08 DIAGNOSIS — R2689 Other abnormalities of gait and mobility: Secondary | ICD-10-CM | POA: Diagnosis not present

## 2023-08-08 DIAGNOSIS — M6281 Muscle weakness (generalized): Secondary | ICD-10-CM

## 2023-08-08 DIAGNOSIS — Z9181 History of falling: Secondary | ICD-10-CM | POA: Diagnosis not present

## 2023-08-08 DIAGNOSIS — R2681 Unsteadiness on feet: Secondary | ICD-10-CM | POA: Diagnosis not present

## 2023-08-08 DIAGNOSIS — R293 Abnormal posture: Secondary | ICD-10-CM | POA: Diagnosis not present

## 2023-08-08 DIAGNOSIS — R278 Other lack of coordination: Secondary | ICD-10-CM | POA: Diagnosis not present

## 2023-08-08 NOTE — Therapy (Signed)
OUTPATIENT PHYSICAL THERAPY NEURO TREATMENT   Patient [name] MRN: [id number] DOB:06-25-1946, 77 y.o., male Today's Date: 08/08/2023   PCP: Emilio Aspen, MD REFERRING PROVIDER: Vladimir Faster, DO  END OF SESSION:  PT End of Session - 08/08/23 1239     Visit Number 3    Number of Visits 17   Plus eval   Date for PT Re-Evaluation 10/08/23    Authorization Type [employer] Medicare    PT Start Time 1232    PT Stop Time 1315    PT Time Calculation (min) 43 min    Equipment Utilized During Treatment Gait belt    Activity Tolerance Patient tolerated treatment well    Behavior During Therapy WFL for tasks assessed/performed               Past Medical History:  Diagnosis Date   Abnormal involuntary movement 02/18/2018   Allergic rhinitis    Arthritis    BPH (benign prostatic hyperplasia)    Diabetes (HCC)    Dysphagia, pharyngeal phase    GERD diagnosed on barium swallow. Has small hiatal hernia. Symptomatically somewhat better on omeprazole but not entirely. We'll try b.i.d. therapy   Edema    1+ in both ankles, likely multifactorial including medication such as Requip   Erectile dysfunction    Staxyn 10 mg or Viagra worked well. 3 samples of Cialis 20 mg provided   GERD (gastroesophageal reflux disease)    Hypercholesteremia    Hypertension    Nephrolithiasis    Onychomycosis of toenail    April 27, 2013 - Dr. Merwyn Katos - podiatry, was in [place] - treating with oral Lamisil and topical nail therapy   Parkinson's disease     followed by Dr. Raquel Sarna at [facility] and Lesia Sago, M.D. in [place]   Presbycusis    and tinnitus - Dr. Serena Colonel - August/2013   Renal calculus    Syncope    Past Surgical History:  Procedure Laterality Date   CORONARY STENT INTERVENTION N/A 01/11/2023   Procedure: CORONARY STENT INTERVENTION;  Surgeon: Kathleene Hazel, MD;  Location: [facility];  Service: Cardiovascular;  Laterality: N/A;   HAND SURGERY      INGUINAL HERNIA REPAIR Left 11/04/2015   Procedure: LEFT INGUINAL HERNIA REPAIR WITH MESH;  Surgeon: Darnell Level, MD;  Location: [facility];  Service: General;  Laterality: Left;   INSERTION OF MESH Left 11/04/2015   Procedure: INSERTION OF MESH;  Surgeon: Darnell Level, MD;  Location: [facility];  Service: General;  Laterality: Left;   JOINT REPLACEMENT Bilateral    KNEE SURGERY     LEFT HEART CATH AND CORONARY ANGIOGRAPHY N/A 01/11/2023   Procedure: LEFT HEART CATH AND CORONARY ANGIOGRAPHY;  Surgeon: Kathleene Hazel, MD;  Location: [facility];  Service: Cardiovascular;  Laterality: N/A;   TOTAL KNEE ARTHROPLASTY     Patient Active Problem List   Diagnosis Date Noted   Gross hematuria 07/03/2023   Depression 06/24/2023   Chemosis 06/24/2023   Elevated troponin 04/03/2023   Sepsis (HCC) 04/01/2023   Coronary artery disease involving native coronary 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 angina pectoris 01/11/2023   Chronic combined systolic and diastolic heart failure (HCC)    Dysphagia 04/29/2022   AKI (acute kidney injury) (HCC)    Dehydration    Failure to thrive (child)    Bacteriuria, asymptomatic    Failure to thrive in adult 04/20/2022  Malaise, possible ESBL Klebsiella UTI 04/20/2022   Chronic retention of urine 04/20/2022   Closed lumbar vertebral fracture (HCC) 04/20/2022   UTI (urinary tract infection) 03/01/2022   Leukocytosis 01/01/2022   Hyponatremia 01/01/2022   CKD (chronic kidney disease), stage IIIa (HCC) 01/01/2022   HTN (hypertension) 12/31/2021   History of ESBL Klebsiella pneumoniae infection 12/31/2021   Generalized weakness 12/31/2021   Chronic venous insufficiency 08/29/2021   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   Encounter for general adult medical examination with abnormal findings 02/18/2018   Enlarged prostate 02/18/2018   Hearing loss 02/18/2018   Other long term (current) drug therapy 02/18/2018    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) 02/18/2018   HLD (hyperlipidemia)    Nephrolithiasis    Parkinson disease (HCC)    Diabetes mellitus with coincident hypertension (HCC)    Syncope    Renal calculus    Diabetes (HCC)    Allergic rhinitis    BPH (benign prostatic hyperplasia)    Erectile dysfunction    Dysphagia, pharyngeal phase    Edema    Ureteral calculus 02/09/2013   LEG PAIN 03/14/2010    ONSET DATE: 07/15/2023 (referral)   REFERRING DIAG: G20.B2 (ICD-10-CM) - Parkinson's disease with dyskinesia and fluctuating manifestations (HCC) R26.89 (ICD-10-CM) - Balance problem W19.XXXD (ICD-10-CM) - Fall, subsequent encounter  THERAPY DIAG:  Other abnormalities of gait and mobility  Unsteadiness on feet  Muscle weakness (generalized)  Rationale for Evaluation and Treatment: Rehabilitation  SUBJECTIVE:                                                                                                                                                                                             SUBJECTIVE STATEMENT: Pt ambulated into clinic w/SPC. Pt reports he would like to pursue a U-step walker. Denies falls or acute changes.   Pt accompanied by:  Wife, Harriett Sine (in lobby)  PERTINENT HISTORY: HTN, chronic bilateral lower extremity edema, GERD, HLD, BPH, parkinson's disease  PAIN:  Are you having pain? No  PRECAUTIONS: Fall  RED FLAGS: None   WEIGHT BEARING RESTRICTIONS: No  FALLS: Has patient fallen in last 6 months? Yes. Number of falls Multiple- unsure the number   LIVING ENVIRONMENT: Lives with: lives with their spouse Lives in: House/apartment - pt reports he lives in the basement due to a fall down the steps last year that resulted in spinal fx  Stairs: Yes: Internal: full flight steps; on right going up Has following equipment at home: Single point cane, Walker - 2 wheeled, Environmental consultant - 4 wheeled, shower chair, and Grab bars  PLOF: Requires assistive  device for independence, Needs assistance with ADLs, and Needs assistance with homemaking  PATIENT GOALS: "to improve balance and strength"   OBJECTIVE:  Note: Objective measures were completed at Evaluation unless otherwise noted.  DIAGNOSTIC FINDINGS: MRI of brain on 07/05/23   IMPRESSION: 1. No acute intracranial abnormality. 2. Mild for age signal changes in the cerebral white matter and a solitary chronic microhemorrhage in the right midbrain most compatible with chronic small vessel disease.  CT of chest on 07/03/23  IMPRESSION: 1. Minimally displaced left sixth rib fracture, without pneumothorax or effusion. 2. Healing left seventh and eighth rib fractures. 3. Cholelithiasis. 4. Left nephrolithiasis without hydronephrosis. 5. Chronic L1 compression fracture deformity. 6. Coronary and aortic Atherosclerosis (ICD10-I70.0).  COGNITION: Overall cognitive status: Impaired and extremely poor insight into deficits    SENSATION: Pt denies numbness/tingling    EDEMA: Chronic BLE edema    POSTURE: rounded shoulders, forward head, increased thoracic kyphosis, and weight shift right  LOWER EXTREMITY MMT:    MMT Right Eval Left Eval  Hip flexion    Hip extension    Hip abduction    Hip adduction    Hip internal rotation    Hip external rotation    Knee flexion    Knee extension    Ankle dorsiflexion    Ankle plantarflexion    Ankle inversion    Ankle eversion    (Blank rows = not tested)  BED MOBILITY:  Independent per pt   TRANSFERS: Assistive device utilized: Single point cane  Sit to stand: CGA Stand to sit: CGA Pt required min A and mod verbal cues to fully turn and back up prior to sitting in chair, as he almost missed chair when walking into evaluation room. Heavy reliance on BUE support w/poor anterior weight shift. Unable to obtain full hip/knee extension in stance  VITALS  Vitals:   08/08/23 1242  BP: 117/62  Pulse: 64      TODAY'S TREATMENT:     Ther Act Assessed vitals (see above) and within limits for therapy   Gait Training  Gait pattern: step through pattern, decreased stride length, decreased hip/knee flexion- Right, decreased hip/knee flexion- Left, decreased ankle dorsiflexion- Right, decreased ankle dorsiflexion- Left, shuffling, trunk flexed, poor foot clearance- Right, and poor foot clearance- Left Distance walked: >300' outside and throughout clinic Assistive device utilized:  U-step Level of assistance: CGA, Min A, and Mod A Comments: Trialed use of U-step walker around gym and outside as pt would like to purchase one. Pt required max cues to maintain close distance to walker and to slow down. Pt tends to place walker outside of his reach and loses balance anteriorly, requiring min-mod A to prevent fall. Pt demonstrates significant freezing when ambulating through doorways and w/turns and requires max verbal cues to stop and reset rather than fight the freezing. Pt does well w/U-step on straightaways but will need practice w/turns and in narrow spaces.   NMR  Printed handout on tips to reduce freezing episodes with standing or walking and verbally reviewed w/pt. Pt reports he uses counting at home and this helps, but will work on stopping and resetting at home this week rather than fighting the freeze.  Established initial HEP (see bolded below) to work on freezing with turns. Pt performed well but did require cues to slow down for increased step length and clearance.     GAIT: Gait pattern: step through pattern, decreased arm swing- Right, decreased arm swing- Left, decreased stride length, decreased hip/knee  flexion- Right, decreased hip/knee flexion- Left, decreased ankle dorsiflexion- Right, decreased ankle dorsiflexion- Left, shuffling, lateral hip instability, decreased trunk rotation, trunk flexed, and poor foot clearance- Right Distance walked: Various clinic distances  Assistive device utilized: Single point  cane Level of assistance: CGA and Min A Comments: Pt relies on furniture walking to stabilize, frequently carrying cane on R side rather than use it. Pt very unsteady w/turns, losing balance posteriorly. Also noted freezing when ambulating through doorways.                                                                                                     PATIENT EDUCATION: Education details: Initial HEP, freezing strategies  Person educated: Patient Education method: Programmer, multimedia, Demonstration, Verbal cues, and Handouts Education comprehension: returned demonstration, verbal cues required, and needs further education  HOME EXERCISE PROGRAM: Access Code: [email or web address] URL: [email or web address] Date: 08/08/2023 Prepared by: Alethia Berthold Daymond Cordts  Exercises - Standing Quarter Turn with Counter Support  - 1 x daily - 7 x weekly - 3 sets - 10 reps  GOALS: Goals reviewed with patient? Yes  SHORT TERM GOALS: Target date: 08/27/2023   Pt will perform initial HEP w/CGA from caregiver for improved strength, balance, transfers and gait.  Baseline: not established on eval  Goal status: INITIAL  2.  Pt will improve 5 x STS to less than or equal to 15 seconds w/proper body mechanics to demonstrate improved functional strength and transfer efficiency.   Baseline: 13.41s w/BUE support and improper body mechanics  Goal status: INITIAL  3.  Pt will be compliant w/use of RW at all times for reduced fall frequency and independence  Baseline: Pt not using  Goal status: INITIAL  4.  Pt and wife will verbalize understanding of freezing strategies to implement at home and in community for reduced fall frequency.   Baseline:  Goal status: INITIAL  5.  Pt will improve normal TUG to less than or equal to 15 seconds w/safest AD and CGA for improved functional mobility and decreased fall risk.  Baseline: 15.68s w/SPC w/min A Goal status: REVISED     LONG TERM GOALS: Target date:  09/24/2023   Pt will perform final HEP w/SBA from caregiver for improved strength, balance, transfers and gait.  Baseline:  Goal status: INITIAL  2.  Pt will improve gait velocity to at least 2.9 ft/s w/LRAD mod I for improved gait efficiency and independence  Baseline: 2.7 ft/s w/SPC and CGA-min A Goal status: INITIAL  3.  Pt will improve 5 x STS to less than or equal to 13 seconds w/proper body mechanics to demonstrate improved functional strength and transfer efficiency.   Baseline: 13.41s w/BUE support and improper body mechanics  Goal status: INITIAL  4.  Pt will improve normal TUG to less than or equal to 13 seconds w/safest AD and SBA for improved functional mobility and decreased fall risk.  Baseline: 15.68s w/SPC w/min A Goal status: REVISED   5.  Pt will verbalize understanding of local PD community resources, including fitness post DC.   Baseline:  Goal status: INITIAL  ASSESSMENT:  CLINICAL IMPRESSION: Emphasis of skilled PT session on assessing gait training w/U-step and freezing strategies. Pt verbalized interest in pursuing U-step walker for reduced fall risk but continues to require min-mod A to safely ambulate w/it due to freezing of gait and poor AD management. If pt slows down, pt much more stable. Will continue to work on freezing w/turns and in doorways for improved safety and reduced fall frequency. Continue POC.    OBJECTIVE IMPAIRMENTS: Abnormal gait, decreased balance, decreased cognition, decreased knowledge of condition, decreased knowledge of use of DME, decreased mobility, difficulty walking, decreased strength, decreased safety awareness, impaired perceived functional ability, improper body mechanics, and pain  ACTIVITY LIMITATIONS: carrying, lifting, bending, standing, squatting, stairs, transfers, reach over head, hygiene/grooming, and locomotion level  PARTICIPATION LIMITATIONS: meal prep, cleaning, laundry, medication management, interpersonal  relationship, driving, shopping, community activity, occupation, and yard work  PERSONAL FACTORS: Age, Behavior pattern, Fitness, Past/current experiences, Transportation, and 1 comorbidity: PD  are also affecting patient's functional outcome.   REHAB POTENTIAL: Fair due to poor compliance w/therapy recommendations  CLINICAL DECISION MAKING: Evolving/moderate complexity  EVALUATION COMPLEXITY: Moderate  PLAN:  PT FREQUENCY: 2x/week  PT DURATION: 8 weeks (POC written for 10 weeks due to delay in scheduling)   PLANNED INTERVENTIONS: 40981- PT Re-evaluation, 97110-Therapeutic exercises, 97530- Therapeutic activity, 97112- Neuromuscular re-education, 97535- Self Care, 19147- Manual therapy, 82956- Gait training, Balance training, Stair training, and DME instructions  PLAN FOR NEXT SESSION: Continue to use U-step. Extensive education needed on safety at home. Add to HEP for improved transfers, posture. Freezing techniques. Pt wants to try TM   Jill Alexanders TRUE Garciamartinez, PT, DPT 08/08/2023, 1:33 PM

## 2023-08-08 NOTE — Telephone Encounter (Signed)
Dr. Boykin Callas. Blankley is being seen for therapy at [facility]. The patient would benefit from a U-Step walker for improved safety with ambulation and reduced fall frequency due to PD.   If you agree, please place an order in [facility] workque in [facility] or fax the order to [phone]. Please also addend your most recent appointment note (05/02/23) to state medical necessity for the U-Step.    Thank you, Josephine Igo, PT, DPT [facility] [address] [place], [place]  [address] Phone:  [phone] Fax:  [phone]

## 2023-08-08 NOTE — Telephone Encounter (Signed)
Printed and in your office for signature

## 2023-08-09 DIAGNOSIS — R339 Retention of urine, unspecified: Secondary | ICD-10-CM | POA: Diagnosis not present

## 2023-08-09 DIAGNOSIS — N3020 Other chronic cystitis without hematuria: Secondary | ICD-10-CM | POA: Diagnosis not present

## 2023-08-12 ENCOUNTER — Ambulatory Visit: Payer: Medicare Other | Admitting: Physical Therapy

## 2023-08-12 DIAGNOSIS — R2689 Other abnormalities of gait and mobility: Secondary | ICD-10-CM

## 2023-08-12 DIAGNOSIS — R2681 Unsteadiness on feet: Secondary | ICD-10-CM | POA: Diagnosis not present

## 2023-08-12 DIAGNOSIS — R278 Other lack of coordination: Secondary | ICD-10-CM | POA: Diagnosis not present

## 2023-08-12 DIAGNOSIS — M6281 Muscle weakness (generalized): Secondary | ICD-10-CM | POA: Diagnosis not present

## 2023-08-12 DIAGNOSIS — Z9181 History of falling: Secondary | ICD-10-CM | POA: Diagnosis not present

## 2023-08-12 DIAGNOSIS — G20B2 Parkinson's disease with dyskinesia, with fluctuations: Secondary | ICD-10-CM | POA: Diagnosis not present

## 2023-08-12 DIAGNOSIS — R29818 Other symptoms and signs involving the nervous system: Secondary | ICD-10-CM | POA: Diagnosis not present

## 2023-08-12 DIAGNOSIS — R293 Abnormal posture: Secondary | ICD-10-CM | POA: Diagnosis not present

## 2023-08-12 NOTE — Therapy (Signed)
OUTPATIENT PHYSICAL THERAPY NEURO TREATMENT   Patient [name] MRN: [id number] DOB:[birth year], 77 y.o., male Today's Date: 08/12/2023   PCP: Emilio Aspen, MD REFERRING PROVIDER: Vladimir Faster, DO  END OF SESSION:  PT End of Session - 08/12/23 1320     Visit Number 4    Number of Visits 17   Plus eval   Date for PT Re-Evaluation 10/08/23    Authorization Type [employer] Medicare    PT Start Time 1318    PT Stop Time 1359    PT Time Calculation (min) 41 min    Equipment Utilized During Treatment Gait belt    Activity Tolerance Patient tolerated treatment well    Behavior During Therapy WFL for tasks assessed/performed                Past Medical History:  Diagnosis Date   Abnormal involuntary movement 02/18/2018   Allergic rhinitis    Arthritis    BPH (benign prostatic hyperplasia)    Diabetes (HCC)    Dysphagia, pharyngeal phase    GERD diagnosed on barium swallow. Has small hiatal hernia. Symptomatically somewhat better on omeprazole but not entirely. We'll try b.i.d. therapy   Edema    1+ in both ankles, likely multifactorial including medication such as Requip   Erectile dysfunction    Staxyn 10 mg or Viagra worked well. 3 samples of Cialis 20 mg provided   GERD (gastroesophageal reflux disease)    Hypercholesteremia    Hypertension    Nephrolithiasis    Onychomycosis of toenail    April 27, 2013 - Dr. Merwyn Katos - podiatry, was in [place] - treating with oral Lamisil and topical nail therapy   Parkinson's disease     followed by Dr. Raquel Sarna at [facility] and Lesia Sago, M.D. in [place]   Presbycusis    and tinnitus - Dr. Serena Colonel - August/2013   Renal calculus    Syncope    Past Surgical History:  Procedure Laterality Date   CORONARY STENT INTERVENTION N/A 01/11/2023   Procedure: CORONARY STENT INTERVENTION;  Surgeon: Kathleene Hazel, MD;  Location: [facility];  Service: Cardiovascular;  Laterality: N/A;   HAND  SURGERY     INGUINAL HERNIA REPAIR Left 11/04/2015   Procedure: LEFT INGUINAL HERNIA REPAIR WITH MESH;  Surgeon: Darnell Level, MD;  Location: [facility];  Service: General;  Laterality: Left;   INSERTION OF MESH Left 11/04/2015   Procedure: INSERTION OF MESH;  Surgeon: Darnell Level, MD;  Location: [facility];  Service: General;  Laterality: Left;   JOINT REPLACEMENT Bilateral    KNEE SURGERY     LEFT HEART CATH AND CORONARY ANGIOGRAPHY N/A 01/11/2023   Procedure: LEFT HEART CATH AND CORONARY ANGIOGRAPHY;  Surgeon: Kathleene Hazel, MD;  Location: [facility];  Service: Cardiovascular;  Laterality: N/A;   TOTAL KNEE ARTHROPLASTY     Patient Active Problem List   Diagnosis Date Noted   Gross hematuria 07/03/2023   Depression 06/24/2023   Chemosis 06/24/2023   Elevated troponin 04/03/2023   Sepsis (HCC) 04/01/2023   Coronary artery disease involving native coronary 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 angina pectoris 01/11/2023   Chronic combined systolic and diastolic heart failure (HCC)    Dysphagia 04/29/2022   AKI (acute kidney injury) (HCC)    Dehydration    Failure to thrive (child)    Bacteriuria, asymptomatic    Failure to thrive in adult 04/20/2022  Malaise, possible ESBL Klebsiella UTI 04/20/2022   Chronic retention of urine 04/20/2022   Closed lumbar vertebral fracture (HCC) 04/20/2022   UTI (urinary tract infection) 03/01/2022   Leukocytosis 01/01/2022   Hyponatremia 01/01/2022   CKD (chronic kidney disease), stage IIIa (HCC) 01/01/2022   HTN (hypertension) 12/31/2021   History of ESBL Klebsiella pneumoniae infection 12/31/2021   Generalized weakness 12/31/2021   Chronic venous insufficiency 08/29/2021   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   Encounter for general adult medical examination with abnormal findings 02/18/2018   Enlarged prostate 02/18/2018   Hearing loss 02/18/2018   Other long term (current) drug therapy  02/18/2018   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) 02/18/2018   HLD (hyperlipidemia)    Nephrolithiasis    Parkinson disease (HCC)    Diabetes mellitus with coincident hypertension (HCC)    Syncope    Renal calculus    Diabetes (HCC)    Allergic rhinitis    BPH (benign prostatic hyperplasia)    Erectile dysfunction    Dysphagia, pharyngeal phase    Edema    Ureteral calculus 02/09/2013   LEG PAIN 03/14/2010    ONSET DATE: 07/15/2023 (referral)   REFERRING DIAG: G20.B2 (ICD-10-CM) - Parkinson's disease with dyskinesia and fluctuating manifestations (HCC) R26.89 (ICD-10-CM) - Balance problem W19.XXXD (ICD-10-CM) - Fall, subsequent encounter  THERAPY DIAG:  Other abnormalities of gait and mobility  Unsteadiness on feet  Muscle weakness (generalized)  Rationale for Evaluation and Treatment: Rehabilitation  SUBJECTIVE:                                                                                                                                                                                             SUBJECTIVE STATEMENT: Pt ambulated into clinic w/SPC. Reports having a good weekend. Denies pain or falls.   Pt accompanied by:  Wife, Harriett Sine (in lobby)  PERTINENT HISTORY: HTN, chronic bilateral lower extremity edema, GERD, HLD, BPH, parkinson's disease  PAIN:  Are you having pain? No  PRECAUTIONS: Fall  RED FLAGS: None   WEIGHT BEARING RESTRICTIONS: No  FALLS: Has patient fallen in last 6 months? Yes. Number of falls Multiple- unsure the number   LIVING ENVIRONMENT: Lives with: lives with their spouse Lives in: House/apartment - pt reports he lives in the basement due to a fall down the steps last year that resulted in spinal fx  Stairs: Yes: Internal: full flight steps; on right going up Has following equipment at home: Single point cane, Walker - 2 wheeled, Environmental consultant - 4 wheeled, shower chair, and Grab bars  PLOF: Requires assistive device for  independence, Needs assistance with  ADLs, and Needs assistance with homemaking  PATIENT GOALS: "to improve balance and strength"   OBJECTIVE:  Note: Objective measures were completed at Evaluation unless otherwise noted.  DIAGNOSTIC FINDINGS: MRI of brain on 07/05/23   IMPRESSION: 1. No acute intracranial abnormality. 2. Mild for age signal changes in the cerebral white matter and a solitary chronic microhemorrhage in the right midbrain most compatible with chronic small vessel disease.  CT of chest on 07/03/23  IMPRESSION: 1. Minimally displaced left sixth rib fracture, without pneumothorax or effusion. 2. Healing left seventh and eighth rib fractures. 3. Cholelithiasis. 4. Left nephrolithiasis without hydronephrosis. 5. Chronic L1 compression fracture deformity. 6. Coronary and aortic Atherosclerosis (ICD10-I70.0).  COGNITION: Overall cognitive status: Impaired and extremely poor insight into deficits    SENSATION: Pt denies numbness/tingling    EDEMA: Chronic BLE edema    POSTURE: rounded shoulders, forward head, increased thoracic kyphosis, and weight shift right  LOWER EXTREMITY MMT:    MMT Right Eval Left Eval  Hip flexion    Hip extension    Hip abduction    Hip adduction    Hip internal rotation    Hip external rotation    Knee flexion    Knee extension    Ankle dorsiflexion    Ankle plantarflexion    Ankle inversion    Ankle eversion    (Blank rows = not tested)  BED MOBILITY:  Independent per pt   TRANSFERS: Assistive device utilized: Single point cane  Sit to stand: CGA Stand to sit: CGA Pt required min A and mod verbal cues to fully turn and back up prior to sitting in chair, as he almost missed chair when walking into evaluation room. Heavy reliance on BUE support w/poor anterior weight shift. Unable to obtain full hip/knee extension in stance  VITALS  There were no vitals filed for this visit.   TODAY'S TREATMENT:   Ther Ex  SciFit  multi-peaks level 8 for 8 minutes using BUE/BLEs for neural priming for reciprocal movement, dynamic cardiovascular warmup and increased amplitude of stepping.  Continued discussion regarding pt obtaining U-step, as pt reports he would like to hold off for now. Lengthy discussion regarding importance of proper gait kinematics and forming good habits now to prevent a fall later. Pt reports he knows he will need an AD later on, but is unlikely to use it in his home currently as he is using a RW. Informed pt that he is not safe w/a RW and has poor body mechanics with it, which pt agrees with. Ended conversation w/pt wanting another week to consider obtaining the U-step now or waiting.  Re-discussed pt's personal goals for PT and pt reports he is unable to carry his grandson due to freezing and instability. Will work on freezing strategies when holding objects in future sessions. Pt in agreement that it is not safe to hold his grandson in standing currently, but can work towards improved balance when holding items in general.     Gait Training  Gait pattern: step through pattern, decreased stride length, decreased hip/knee flexion- Right, decreased hip/knee flexion- Left, decreased ankle dorsiflexion- Right, decreased ankle dorsiflexion- Left, shuffling, trunk flexed, poor foot clearance- Right, and poor foot clearance- Left Distance walked: >300' throughout clinic and through doorways  Assistive device utilized:  U-step Level of assistance: CGA Comments: Continued to practice navigating U-step through narrow doorways and tight turns to imitate pt's home environment. Pt demonstrated improved freezing techniques this date and proper AD management. Pt demonstrates  improved step clearance and length when he slows down, so cued pt intermittently to slow down if he began to lose control of U-step anteriorly.     GAIT: Gait pattern: step through pattern, decreased arm swing- Right, decreased arm swing- Left,  decreased stride length, decreased hip/knee flexion- Right, decreased hip/knee flexion- Left, decreased ankle dorsiflexion- Right, decreased ankle dorsiflexion- Left, shuffling, lateral hip instability, decreased trunk rotation, trunk flexed, and poor foot clearance- Right Distance walked: Various clinic distances  Assistive device utilized: Single point cane Level of assistance: CGA and Min A Comments: Pt frequently carrying cane on R side rather than use it. Pt continues to be challenged w/freezing during turns and through narrow spaces, but did demonstrate improved freezing strategies this date.                                                                                                      PATIENT EDUCATION: Education details: See above  Person educated: Patient Education method: Programmer, multimedia, Demonstration, Verbal cues, and Handouts Education comprehension: returned demonstration, verbal cues required, and needs further education  HOME EXERCISE PROGRAM: Access Code: [email or web address] URL: [email or web address] Date: 08/08/2023 Prepared by: Alethia Berthold Jilliann Subramanian  Exercises - Standing Quarter Turn with Counter Support  - 1 x daily - 7 x weekly - 3 sets - 10 reps  GOALS: Goals reviewed with patient? Yes  SHORT TERM GOALS: Target date: 08/27/2023   Pt will perform initial HEP w/CGA from caregiver for improved strength, balance, transfers and gait.  Baseline: not established on eval  Goal status: INITIAL  2.  Pt will improve 5 x STS to less than or equal to 15 seconds w/proper body mechanics to demonstrate improved functional strength and transfer efficiency.   Baseline: 13.41s w/BUE support and improper body mechanics  Goal status: INITIAL  3.  Pt will be compliant w/use of RW at all times for reduced fall frequency and independence  Baseline: Pt not using  Goal status: INITIAL  4.  Pt and wife will verbalize understanding of freezing strategies to implement at home and  in community for reduced fall frequency.   Baseline:  Goal status: INITIAL  5.  Pt will improve normal TUG to less than or equal to 15 seconds w/safest AD and CGA for improved functional mobility and decreased fall risk.  Baseline: 15.68s w/SPC w/min A Goal status: REVISED     LONG TERM GOALS: Target date: 09/24/2023   Pt will perform final HEP w/SBA from caregiver for improved strength, balance, transfers and gait.  Baseline:  Goal status: INITIAL  2.  Pt will improve gait velocity to at least 2.9 ft/s w/LRAD mod I for improved gait efficiency and independence  Baseline: 2.7 ft/s w/SPC and CGA-min A Goal status: INITIAL  3.  Pt will improve 5 x STS to less than or equal to 13 seconds w/proper body mechanics to demonstrate improved functional strength and transfer efficiency.   Baseline: 13.41s w/BUE support and improper body mechanics  Goal status: INITIAL  4.  Pt will improve normal TUG to less than or equal to 13 seconds  w/safest AD and SBA for improved functional mobility and decreased fall risk.  Baseline: 15.68s w/SPC w/min A Goal status: REVISED   5.  Pt will verbalize understanding of local PD community resources, including fitness post DC.   Baseline:  Goal status: INITIAL   ASSESSMENT:  CLINICAL IMPRESSION: Emphasis of skilled PT session on continued gait training w/U-step and discussing pt's goals for PT. Pt demonstrated improved mechanics w/use of U-step this date but reports he would like to hold off on obtaining one. Encouraged pt to obtain one sooner than later as he is much safer w/use of AD. Pt reports he will think about it. Pt will benefit from continued practice w/freezing strategies as he freezes w/turns, narrow spaces and when holding objects. Continue POC.    OBJECTIVE IMPAIRMENTS: Abnormal gait, decreased balance, decreased cognition, decreased knowledge of condition, decreased knowledge of use of DME, decreased mobility, difficulty walking,  decreased strength, decreased safety awareness, impaired perceived functional ability, improper body mechanics, and pain  ACTIVITY LIMITATIONS: carrying, lifting, bending, standing, squatting, stairs, transfers, reach over head, hygiene/grooming, and locomotion level  PARTICIPATION LIMITATIONS: meal prep, cleaning, laundry, medication management, interpersonal relationship, driving, shopping, community activity, occupation, and yard work  PERSONAL FACTORS: Age, Behavior pattern, Fitness, Past/current experiences, Transportation, and 1 comorbidity: PD  are also affecting patient's functional outcome.   REHAB POTENTIAL: Fair due to poor compliance w/therapy recommendations  CLINICAL DECISION MAKING: Evolving/moderate complexity  EVALUATION COMPLEXITY: Moderate  PLAN:  PT FREQUENCY: 2x/week  PT DURATION: 8 weeks (POC written for 10 weeks due to delay in scheduling)   PLANNED INTERVENTIONS: 29528- PT Re-evaluation, 97110-Therapeutic exercises, 97530- Therapeutic activity, 97112- Neuromuscular re-education, 97535- Self Care, 41324- Manual therapy, 40102- Gait training, Balance training, Stair training, and DME instructions  PLAN FOR NEXT SESSION: Continue to use U-step. Extensive education needed on safety at home. Add to HEP for improved transfers, posture. Freezing techniques. Pt wants to try TM   Jill Alexanders Artist Bloom, PT, DPT 08/12/2023, 3:15 PM

## 2023-08-15 ENCOUNTER — Ambulatory Visit: Payer: Medicare Other | Admitting: Physical Therapy

## 2023-08-15 ENCOUNTER — Ambulatory Visit: Payer: Medicare Other | Admitting: Occupational Therapy

## 2023-08-15 ENCOUNTER — Encounter: Payer: Self-pay | Admitting: Occupational Therapy

## 2023-08-15 DIAGNOSIS — R2689 Other abnormalities of gait and mobility: Secondary | ICD-10-CM

## 2023-08-15 DIAGNOSIS — R29818 Other symptoms and signs involving the nervous system: Secondary | ICD-10-CM

## 2023-08-15 DIAGNOSIS — M6281 Muscle weakness (generalized): Secondary | ICD-10-CM

## 2023-08-15 DIAGNOSIS — R2681 Unsteadiness on feet: Secondary | ICD-10-CM

## 2023-08-15 DIAGNOSIS — G20A1 Parkinson's disease without dyskinesia, without mention of fluctuations: Secondary | ICD-10-CM

## 2023-08-15 DIAGNOSIS — G20B2 Parkinson's disease with dyskinesia, with fluctuations: Secondary | ICD-10-CM | POA: Diagnosis not present

## 2023-08-15 DIAGNOSIS — R278 Other lack of coordination: Secondary | ICD-10-CM | POA: Diagnosis not present

## 2023-08-15 DIAGNOSIS — Z9181 History of falling: Secondary | ICD-10-CM | POA: Diagnosis not present

## 2023-08-15 DIAGNOSIS — R293 Abnormal posture: Secondary | ICD-10-CM | POA: Diagnosis not present

## 2023-08-19 ENCOUNTER — Encounter: Payer: Self-pay | Admitting: Physical Therapy

## 2023-08-19 ENCOUNTER — Ambulatory Visit: Payer: Medicare Other | Attending: Internal Medicine | Admitting: Physical Therapy

## 2023-08-19 ENCOUNTER — Ambulatory Visit: Payer: Medicare Other

## 2023-08-19 DIAGNOSIS — R2689 Other abnormalities of gait and mobility: Secondary | ICD-10-CM

## 2023-08-19 DIAGNOSIS — R278 Other lack of coordination: Secondary | ICD-10-CM

## 2023-08-19 DIAGNOSIS — R471 Dysarthria and anarthria: Secondary | ICD-10-CM | POA: Diagnosis not present

## 2023-08-19 DIAGNOSIS — R2681 Unsteadiness on feet: Secondary | ICD-10-CM

## 2023-08-19 DIAGNOSIS — R41841 Cognitive communication deficit: Secondary | ICD-10-CM

## 2023-08-19 DIAGNOSIS — M6281 Muscle weakness (generalized): Secondary | ICD-10-CM | POA: Insufficient documentation

## 2023-08-19 DIAGNOSIS — R29818 Other symptoms and signs involving the nervous system: Secondary | ICD-10-CM

## 2023-08-19 NOTE — Therapy (Signed)
OUTPATIENT SPEECH LANGUAGE PATHOLOGY PARKINSON'S EVALUATION   Patient [name] MRN: [id number] DOB:January 13, 1946, 77 y.o., male Today's Date: 08/19/2023  PCP: Emilio Aspen MD  REFERRING PROVIDER: Vladimir Faster, DO  END OF SESSION:  End of Session - 08/19/23 1234     Visit Number 1    Number of Visits 9    Date for SLP Re-Evaluation 10/14/23    Authorization Type [employer]    SLP Start Time 1233    SLP Stop Time  1315    SLP Time Calculation (min) 42 min    Activity Tolerance Patient tolerated treatment well             Past Medical History:  Diagnosis Date   Abnormal involuntary movement 02/18/2018   Allergic rhinitis    Arthritis    BPH (benign prostatic hyperplasia)    Diabetes (HCC)    Dysphagia, pharyngeal phase    GERD diagnosed on barium swallow. Has small hiatal hernia. Symptomatically somewhat better on omeprazole but not entirely. We'll try b.i.d. therapy   Edema    1+ in both ankles, likely multifactorial including medication such as Requip   Erectile dysfunction    Staxyn 10 mg or Viagra worked well. 3 samples of Cialis 20 mg provided   GERD (gastroesophageal reflux disease)    Hypercholesteremia    Hypertension    Nephrolithiasis    Onychomycosis of toenail    April 27, 2013 - Dr. Merwyn Katos - podiatry, was in [place] - treating with oral Lamisil and topical nail therapy   Parkinson's disease [facility])     followed by Dr. Raquel Sarna at [facility] and Lesia Sago, M.D. in [place]   Presbycusis    and tinnitus - Dr. Serena Colonel - August/2013   Renal calculus    Syncope    Past Surgical History:  Procedure Laterality Date   CORONARY STENT INTERVENTION N/A 01/11/2023   Procedure: CORONARY STENT INTERVENTION;  Surgeon: Kathleene Hazel, MD;  Location: [facility];  Service: Cardiovascular;  Laterality: N/A;   HAND SURGERY     INGUINAL HERNIA REPAIR Left 11/04/2015   Procedure: LEFT INGUINAL HERNIA REPAIR WITH MESH;  Surgeon:  Darnell Level, MD;  Location: [facility];  Service: General;  Laterality: Left;   INSERTION OF MESH Left 11/04/2015   Procedure: INSERTION OF MESH;  Surgeon: Darnell Level, MD;  Location: [facility];  Service: General;  Laterality: Left;   JOINT REPLACEMENT Bilateral    KNEE SURGERY     LEFT HEART CATH AND CORONARY ANGIOGRAPHY N/A 01/11/2023   Procedure: LEFT HEART CATH AND CORONARY ANGIOGRAPHY;  Surgeon: Kathleene Hazel, MD;  Location: [facility];  Service: Cardiovascular;  Laterality: N/A;   TOTAL KNEE ARTHROPLASTY     Patient Active Problem List   Diagnosis Date Noted   Gross hematuria 07/03/2023   Depression 06/24/2023   Chemosis 06/24/2023   Elevated troponin 04/03/2023   Sepsis (HCC) 04/01/2023   Coronary artery disease involving native coronary 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 angina pectoris 01/11/2023   Chronic combined systolic and diastolic heart failure (HCC)    Dysphagia 04/29/2022   AKI (acute kidney injury) (HCC)    Dehydration    Failure to thrive (child)    Bacteriuria, asymptomatic    Failure to thrive in adult 04/20/2022   Malaise, possible ESBL Klebsiella UTI 04/20/2022   Chronic retention of urine 04/20/2022   Closed lumbar vertebral fracture (HCC) 04/20/2022  UTI (urinary tract infection) 03/01/2022   Leukocytosis 01/01/2022   Hyponatremia 01/01/2022   CKD (chronic kidney disease), stage IIIa (HCC) 01/01/2022   HTN (hypertension) 12/31/2021   History of ESBL Klebsiella pneumoniae infection 12/31/2021   Generalized weakness 12/31/2021   Chronic venous insufficiency 08/29/2021   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   Encounter for general adult medical examination with abnormal findings 02/18/2018   Enlarged prostate 02/18/2018   Hearing loss 02/18/2018   Other long term (current) drug therapy 02/18/2018   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) 02/18/2018   HLD (hyperlipidemia)     Nephrolithiasis    Parkinson disease (HCC)    Diabetes mellitus with coincident hypertension (HCC)    Syncope    Renal calculus    Diabetes (HCC)    Allergic rhinitis    BPH (benign prostatic hyperplasia)    Erectile dysfunction    Dysphagia, pharyngeal phase    Edema    Ureteral calculus 02/09/2013   LEG PAIN 03/14/2010    ONSET DATE: 07/30/2023 (referral date)  REFERRING DIAG: G20.A1 (ICD-10-CM) - Parkinson's disease without dyskinesia or fluctuating manifestations  THERAPY DIAG:  Dysarthria and anarthria  Cognitive communication deficit  Rationale for Evaluation and Treatment: Rehabilitation  SUBJECTIVE:   SUBJECTIVE STATEMENT: "I sometimes talk softly so people can't understand me at times" Pt accompanied by: self  PERTINENT HISTORY: "77 year old male with history of Parkinson's, frequent falls, CHF, GERD. Patient with brief episode in hospital where he was not really following directions and concern was present for CVA. MRI negative for acute CVA. Patient resides at home with his wife. Patient has undergone prior esophagram in 2017 that showed hiatal hernia, esophageal dysmotility with tertiary contractions, and intact pharyngeal swallow appeared functional. Indication for esophagram was patient complained of hoarseness and problems with swallowing. Patient and wife denied him having significant issues with swallowing they do endorse that it within the last few months he has had difficulty swallowing large pills sensing them sticking in his pharynx (pointing to proximal area)."   PAIN: Are you having pain? No  FALLS: Has patient fallen in last 6 months?  Yes, See PT evaluation for details  LIVING ENVIRONMENT: Lives with: lives with their spouse Lives in: House/apartment  PLOF:  Level of assistance: Needed assistance with ADLs, Needed assistance with IADLS Employment: Full-time employment  PATIENT GOALS: "to communicate better"  OBJECTIVE:  Note: Objective measures  were completed at Evaluation unless otherwise noted.  COGNITION: Overall cognitive status: Impaired Areas of impairment: Attention, Memory, Awareness, and Problem solving Comments: reports mild decline in memory and focus, such as when managing meds, finances, and work-tasks  MOTOR SPEECH: Overall motor speech: impaired Level of impairment: Conversation Respiration: thoracic breathing, clavicular breathing, and speaking on residual capacity Phonation: low vocal intensity Resonance: WFL Articulation: Appears intact Intelligibility: Intelligibility reduced Motor planning: Appears intact  ORAL MOTOR EXAMINATION: Overall status: WFL  OBJECTIVE VOICE ASSESSMENT: Sustained "ah" maximum phonation time: 11 seconds Sustained "ah" loudness average: 80 dB Oral reading (passage) loudness average: 64 dB Oral reading loudness range: 57-72 dB Conversational loudness average: 66 dB Conversational loudness range: 55-72 dB Voice quality: hoarse, rough, and low vocal intensity Stimulability trials: Given SLP modeling and occasional mod cues, loudness average increased to low 70's dB at the word and phrase level.  Comments: pt reports voice being raspier and quieter than it used to be  Pt does not report difficulty with swallowing which does not warrant further evaluation.  PATIENT REPORTED OUTCOME MEASURES (PROM):  Cognitive function: Short Form: 30 Rarely (4): had to read something several times to understand it, thinking was slow, had to work really hard to pay attention or I would make a mistake Sometimes (3): had trouble concentrating Never (5): learning new information A little (4): reading/following complex instruction Somewhat (3): keeping appointments and managing daily activities  Communication Participation Item Bank: 20; ranked "a little" (2) for all  TODAY'S TREATMENT:                                                                                                                                          08/19/23: eval only   PATIENT EDUCATION: Education details: eval results, Speak Out program Person educated: Patient Education method: Explanation Education comprehension: verbalized understanding   GOALS: Goals reviewed with patient? Yes  SHORT TERM GOALS: Target date: 09/16/2023  Pt will achieve target dB in Speak Out warm-up and reading exercises x2 sessions given rare min A Baseline: Goal status: INITIAL  2.  Pt will maintain 70+ dB in 5 minute structured conversation x2 sessions given occasional min A Baseline:  Goal status: INITIAL  3.  Pt will generate plan to complete daily HEP given rare min A  Baseline:  Goal status: INITIAL  4.  Pt will verbalize attention/memory strategies x2 to aid completion of iADL tasks at home/work given rare min A Baseline:  Goal status: INITIAL   LONG TERM GOALS: Target date: 10/14/2023  Pt will report completion of daily HEP over 1 week period Baseline:  Goal status: INITIAL  2.  Pt will achieve target dB averages in Speak Out cognitive exercises x2 sessions given rare min A Baseline:  Goal status: INITIAL  3.  Pt will maintain 70+ dB in 15-minute unstructured conversation given rare min A Baseline:  Goal status: INITIAL  4.  Pt will report use of learned cognitive compensations at home or work in 2/2 opportunities Baseline:  Goal status: INITIAL  ASSESSMENT:  CLINICAL IMPRESSION: Patient is a 77 y.o. M who was seen today for speech therapy evaluation for PD. Hx includes HTN, chronic bilateral lower extremity edema, GERD, HLD, BPH, Parkinson's disease. Pt was referred by PT and denies significant challenges with speech, cognition, or swallowing; however, he acknowledges that his memory, focus, volume, and vocal quality are not where they used to be. Presents with intermittent low-intensity, hoarse vocal quality, limited insight to deficits, and reduced processing during today's evaluation. Pt is currently  still involved with multiple business endeavors, but reports that children have begun to take over some responsibilities. Endorsed some occasional memory mistakes, including intermittently forgetting medications and some difficulty managing finances/multiple businesses. Reported cognitive linguistic decline has contributed to reduced social interaction. Pt would benefit from skilled ST intervention to optimize cognitive functioning and communication effectiveness for increased life participation and mitigate decline related to progressive disease.   OBJECTIVE IMPAIRMENTS: Objective impairments include attention, memory, awareness,  executive functioning, and dysarthria. These impairments are limiting patient from ADLs/IADLs and effectively communicating at home and in community.Factors affecting potential to achieve goals and functional outcome are cooperation/participation level and medical prognosis. Patient will benefit from skilled SLP services to address above impairments and improve overall function.  REHAB POTENTIAL: Good  PLAN:  SLP FREQUENCY: 1-2x/week  SLP DURATION: 8 weeks  PLANNED INTERVENTIONS: Cueing hierachy, Cognitive reorganization, Internal/external aids, Functional tasks, SLP instruction and feedback, and Compensatory strategies    Delany Loonam, Student-SLP 08/19/2023, 2:10 PM  I agree with the following treatment note after reviewing documentation. This session was performed under the supervision of a licensed clinician.  Gracy Racer, CCC-SLP 08/19/2023, 3:13 PM

## 2023-08-19 NOTE — Therapy (Signed)
OUTPATIENT PHYSICAL THERAPY NEURO TREATMENT   Patient [name] MRN: [id number] DOB:[birth year], 77 y.o., male Today's Date: 08/19/2023   PCP: Emilio Aspen, MD REFERRING PROVIDER: Vladimir Faster, DO  END OF SESSION:  PT End of Session - 08/19/23 1317     Visit Number 6    Number of Visits 17   Plus eval   Date for PT Re-Evaluation 10/08/23    Authorization Type [employer] Medicare    PT Start Time 1316    PT Stop Time 1357    PT Time Calculation (min) 41 min    Equipment Utilized During Treatment Gait belt    Activity Tolerance Patient tolerated treatment well    Behavior During Therapy WFL for tasks assessed/performed                Past Medical History:  Diagnosis Date   Abnormal involuntary movement 02/18/2018   Allergic rhinitis    Arthritis    BPH (benign prostatic hyperplasia)    Diabetes (HCC)    Dysphagia, pharyngeal phase    GERD diagnosed on barium swallow. Has small hiatal hernia. Symptomatically somewhat better on omeprazole but not entirely. We'll try b.i.d. therapy   Edema    1+ in both ankles, likely multifactorial including medication such as Requip   Erectile dysfunction    Staxyn 10 mg or Viagra worked well. 3 samples of Cialis 20 mg provided   GERD (gastroesophageal reflux disease)    Hypercholesteremia    Hypertension    Nephrolithiasis    Onychomycosis of toenail    April 27, 2013 - Dr. Merwyn Katos - podiatry, was in [place] - treating with oral Lamisil and topical nail therapy   Parkinson's disease [facility])     followed by Dr. Raquel Sarna at [facility] and Lesia Sago, M.D. in [place]   Presbycusis    and tinnitus - Dr. Serena Colonel - August/2013   Renal calculus    Syncope    Past Surgical History:  Procedure Laterality Date   CORONARY STENT INTERVENTION N/A 01/11/2023   Procedure: CORONARY STENT INTERVENTION;  Surgeon: Kathleene Hazel, MD;  Location: [facility];  Service: Cardiovascular;  Laterality: N/A;   HAND  SURGERY     INGUINAL HERNIA REPAIR Left 11/04/2015   Procedure: LEFT INGUINAL HERNIA REPAIR WITH MESH;  Surgeon: Darnell Level, MD;  Location: [facility];  Service: General;  Laterality: Left;   INSERTION OF MESH Left 11/04/2015   Procedure: INSERTION OF MESH;  Surgeon: Darnell Level, MD;  Location: [facility];  Service: General;  Laterality: Left;   JOINT REPLACEMENT Bilateral    KNEE SURGERY     LEFT HEART CATH AND CORONARY ANGIOGRAPHY N/A 01/11/2023   Procedure: LEFT HEART CATH AND CORONARY ANGIOGRAPHY;  Surgeon: Kathleene Hazel, MD;  Location: [facility];  Service: Cardiovascular;  Laterality: N/A;   TOTAL KNEE ARTHROPLASTY     Patient Active Problem List   Diagnosis Date Noted   Gross hematuria 07/03/2023   Depression 06/24/2023   Chemosis 06/24/2023   Elevated troponin 04/03/2023   Sepsis (HCC) 04/01/2023   Coronary artery disease involving native coronary 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 angina pectoris 01/11/2023   Chronic combined systolic and diastolic heart failure (HCC)    Dysphagia 04/29/2022   AKI (acute kidney injury) (HCC)    Dehydration    Failure to thrive (child)    Bacteriuria, asymptomatic    Failure to thrive in adult 04/20/2022  Malaise, possible ESBL Klebsiella UTI 04/20/2022   Chronic retention of urine 04/20/2022   Closed lumbar vertebral fracture (HCC) 04/20/2022   UTI (urinary tract infection) 03/01/2022   Leukocytosis 01/01/2022   Hyponatremia 01/01/2022   CKD (chronic kidney disease), stage IIIa (HCC) 01/01/2022   HTN (hypertension) 12/31/2021   History of ESBL Klebsiella pneumoniae infection 12/31/2021   Generalized weakness 12/31/2021   Chronic venous insufficiency 08/29/2021   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   Encounter for general adult medical examination with abnormal findings 02/18/2018   Enlarged prostate 02/18/2018   Hearing loss 02/18/2018   Other long term (current) drug therapy  02/18/2018   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) 02/18/2018   HLD (hyperlipidemia)    Nephrolithiasis    Parkinson disease (HCC)    Diabetes mellitus with coincident hypertension (HCC)    Syncope    Renal calculus    Diabetes (HCC)    Allergic rhinitis    BPH (benign prostatic hyperplasia)    Erectile dysfunction    Dysphagia, pharyngeal phase    Edema    Ureteral calculus 02/09/2013   LEG PAIN 03/14/2010    ONSET DATE: 07/15/2023 (referral)   REFERRING DIAG: G20.B2 (ICD-10-CM) - Parkinson's disease with dyskinesia and fluctuating manifestations (HCC) R26.89 (ICD-10-CM) - Balance problem W19.XXXD (ICD-10-CM) - Fall, subsequent encounter  THERAPY DIAG:  Other lack of coordination  Other symptoms and signs involving the nervous system  Other abnormalities of gait and mobility  Unsteadiness on feet  Rationale for Evaluation and Treatment: Rehabilitation  SUBJECTIVE:                                                                                                                                                                                             SUBJECTIVE STATEMENT: No falls, ambulates into clinic with SPC. Freezing is still the major issue at times. Seems as if freezing is not as bad if he is looking up rather than down at the floor.   Pt accompanied by:  Self  PERTINENT HISTORY: HTN, chronic bilateral lower extremity edema, GERD, HLD, BPH, parkinson's disease  PAIN:  Are you having pain? No  PRECAUTIONS: Fall  RED FLAGS: None   WEIGHT BEARING RESTRICTIONS: No  FALLS: Has patient fallen in last 6 months? Yes. Number of falls Multiple- unsure the number   LIVING ENVIRONMENT: Lives with: lives with their spouse Lives in: House/apartment - pt reports he lives in the basement due to a fall down the steps last year that resulted in spinal fx  Stairs: Yes: Internal: full flight steps; on right going up Has following equipment at home:  Single point  cane, Walker - 2 wheeled, Environmental consultant - 4 wheeled, shower chair, and Grab bars  PLOF: Requires assistive device for independence, Needs assistance with ADLs, and Needs assistance with homemaking  PATIENT GOALS: "to improve balance and strength"   OBJECTIVE:  Note: Objective measures were completed at Evaluation unless otherwise noted.  DIAGNOSTIC FINDINGS: MRI of brain on 07/05/23   IMPRESSION: 1. No acute intracranial abnormality. 2. Mild for age signal changes in the cerebral white matter and a solitary chronic microhemorrhage in the right midbrain most compatible with chronic small vessel disease.  CT of chest on 07/03/23  IMPRESSION: 1. Minimally displaced left sixth rib fracture, without pneumothorax or effusion. 2. Healing left seventh and eighth rib fractures. 3. Cholelithiasis. 4. Left nephrolithiasis without hydronephrosis. 5. Chronic L1 compression fracture deformity. 6. Coronary and aortic Atherosclerosis (ICD10-I70.0).  COGNITION: Overall cognitive status: Impaired and extremely poor insight into deficits    SENSATION: Pt denies numbness/tingling    EDEMA: Chronic BLE edema    POSTURE: rounded shoulders, forward head, increased thoracic kyphosis, and weight shift right  LOWER EXTREMITY MMT:    MMT Right Eval Left Eval  Hip flexion    Hip extension    Hip abduction    Hip adduction    Hip internal rotation    Hip external rotation    Knee flexion    Knee extension    Ankle dorsiflexion    Ankle plantarflexion    Ankle inversion    Ankle eversion    (Blank rows = not tested)  BED MOBILITY:  Independent per pt   TRANSFERS: Assistive device utilized: Single point cane  Sit to stand: CGA Stand to sit: CGA Pt required min A and mod verbal cues to fully turn and back up prior to sitting in chair, as he almost missed chair when walking into evaluation room. Heavy reliance on BUE support w/poor anterior weight shift. Unable to obtain full hip/knee  extension in stance  VITALS  There were no vitals filed for this visit.   TODAY'S TREATMENT:   Ther Ex  SciFit multi-peaks level 7.0 for 8 minutes using BUE/BLEs for neural priming for reciprocal movement, dynamic cardiovascular warmup and increased amplitude of stepping.   NMR   Pt performs PWR! Moves in Standing position x 20 reps, performed with chair in front of pt as needed for balance   PWR! Up for improved posture  PWR! Rock for improved weighshifting - cued to look up at hands   PWR! Twist for improved trunk rotation   PWR! Step for improved step initiation - cued for incr foot clearance when stepping, esp with RLE   Cues provided for larger amplitude movements, looking at hands, slowing down at times. Educated how each movement relates to function. Added to pt's HEP for pt to perform with a chair on front of him as needed for balance    Worked on freezing episodes when going through doorways with use of SPC - performed x2 reps with pt using strategy of spotting an object ahead and walking to it with no freezing noted, added dual tasking with pt having to count backwards by 3s, performed a total of 8 reps, no freezing noted, but pt did have more shuffled steps when turning, cued to focus on foot clearance and really trying to pick up feet when turning  Pt reporting freezing when going through a closed door, practiced with the door closed and pt opening it, initial cues to stand closer to the  door when opening it as pt initially too far away, pt then able to walk through with no freezing. Pt reporting its normally when a door is heavier, practiced with a heavier door for a few reps with cues to stand closer and then after it opens, can try to shift weight and then take bigger steps to walk through as pt initially with very short shuffled steps   GAIT: Gait pattern: step through pattern, decreased arm swing- Right, decreased arm swing- Left, decreased stride length, decreased  hip/knee flexion- Right, decreased hip/knee flexion- Left, decreased ankle dorsiflexion- Right, decreased ankle dorsiflexion- Left, shuffling, lateral hip instability, decreased trunk rotation, trunk flexed, and poor foot clearance- Right Distance walked: Various clinic distances  Assistive device utilized: Single point cane Level of assistance: CGA Comments: Pt demonstrating improvement in freezing during session today. Pt uses cane in RUE during session with intermittent cues to slow down and focus on incr stride length.  PATIENT EDUCATION: Education details: Standing PWR moves to [employer], pt reports he got an order for a U-Step in the mail from Dr. Arbutus Leas, discussed bringing it in to next session so we can make a copy and work on the process of obtaining one (pt seems agreeable to this during session today).  Person educated: Patient Education method: Explanation, Demonstration, Verbal cues, and Handouts Education comprehension: returned demonstration, verbal cues required, and needs further education  HOME EXERCISE PROGRAM: Standing PWR Moves   Access Code: [email or web address] URL: [email or web address] Date: 08/08/2023 Prepared by: Alethia Berthold Plaster  Exercises - Standing Quarter Turn with Counter Support  - 1 x daily - 7 x weekly - 3 sets - 10 reps  Access Code: [email or web address] URL: [email or web address] Date: 08/15/2023 Prepared by: Alethia Berthold Plaster  Exercises - Sit to Stand Without Arm Support  - 1 x daily - 7 x weekly - 3 sets - 10 reps  GOALS: Goals reviewed with patient? Yes  SHORT TERM GOALS: Target date: 08/27/2023   Pt will perform initial HEP w/CGA from caregiver for improved strength, balance, transfers and gait.  Baseline: not established on eval  Goal status: INITIAL  2.  Pt will improve 5 x STS to less than or equal to 15 seconds w/proper body mechanics to demonstrate improved functional strength and transfer efficiency.   Baseline: 13.41s w/BUE support and  improper body mechanics  Goal status: INITIAL  3.  Pt will be compliant w/use of RW at all times for reduced fall frequency and independence  Baseline: Pt not using  Goal status: INITIAL  4.  Pt and wife will verbalize understanding of freezing strategies to implement at home and in community for reduced fall frequency.   Baseline:  Goal status: INITIAL  5.  Pt will improve normal TUG to less than or equal to 15 seconds w/safest AD and CGA for improved functional mobility and decreased fall risk.  Baseline: 15.68s w/SPC w/min A Goal status: REVISED     LONG TERM GOALS: Target date: 09/24/2023   Pt will perform final HEP w/SBA from caregiver for improved strength, balance, transfers and gait.  Baseline:  Goal status: INITIAL  2.  Pt will improve gait velocity to at least 2.9 ft/s w/LRAD mod I for improved gait efficiency and independence  Baseline: 2.7 ft/s w/SPC and CGA-min A Goal status: INITIAL  3.  Pt will improve 5 x STS to less than or equal to 13 seconds w/proper body mechanics to demonstrate improved functional strength and transfer efficiency.   Baseline: 13.41s w/BUE support and improper  body mechanics  Goal status: INITIAL  4.  Pt will improve normal TUG to less than or equal to 13 seconds w/safest AD and SBA for improved functional mobility and decreased fall risk.  Baseline: 15.68s w/SPC w/min A Goal status: REVISED   5.  Pt will verbalize understanding of local PD community resources, including fitness post DC.   Baseline:  Goal status: INITIAL   ASSESSMENT:  CLINICAL IMPRESSION:  Today's skilled session focused on working on freezing strategies when going through doors/doorways and adding in standing PWR moves to HEP. Chair in front of pt as needed for balance with standing PWR moves, more so with [employer]. Added to HEP for balance and to work on larger amplitude movements at home. With cane, pt did demo improvement in freezing strategies when going  through doorways, even with addition of cognitive dual task. Pt does need reminder cues for foot clearance and bigger steps when going to turn. Will continue to progress towards LTGs.     OBJECTIVE IMPAIRMENTS: Abnormal gait, decreased balance, decreased cognition, decreased knowledge of condition, decreased knowledge of use of DME, decreased mobility, difficulty walking, decreased strength, decreased safety awareness, impaired perceived functional ability, improper body mechanics, and pain  ACTIVITY LIMITATIONS: carrying, lifting, bending, standing, squatting, stairs, transfers, reach over head, hygiene/grooming, and locomotion level  PARTICIPATION LIMITATIONS: meal prep, cleaning, laundry, medication management, interpersonal relationship, driving, shopping, community activity, occupation, and yard work  PERSONAL FACTORS: Age, Behavior pattern, Fitness, Past/current experiences, Transportation, and 1 comorbidity: PD  are also affecting patient's functional outcome.   REHAB POTENTIAL: Fair due to poor compliance w/therapy recommendations  CLINICAL DECISION MAKING: Evolving/moderate complexity  EVALUATION COMPLEXITY: Moderate  PLAN:  PT FREQUENCY: 2x/week  PT DURATION: 8 weeks (POC written for 10 weeks due to delay in scheduling)   PLANNED INTERVENTIONS: 40981- PT Re-evaluation, 97110-Therapeutic exercises, 97530- Therapeutic activity, 97112- Neuromuscular re-education, 97535- Self Care, 19147- Manual therapy, 82956- Gait training, Balance training, Stair training, and DME instructions  PLAN FOR NEXT SESSION: Continue to use U-step. Extensive education needed on safety at home. Add to HEP for improved transfers, posture. Freezing techniques. Pt wants to try TM. Reciprocal coordination, lateral weight shifting, turns   Drake Leach, PT, DPT 08/19/2023, 1:59 PM

## 2023-08-22 ENCOUNTER — Ambulatory Visit: Payer: Medicare Other | Admitting: Physical Therapy

## 2023-08-22 DIAGNOSIS — R2689 Other abnormalities of gait and mobility: Secondary | ICD-10-CM

## 2023-08-22 DIAGNOSIS — R471 Dysarthria and anarthria: Secondary | ICD-10-CM | POA: Diagnosis not present

## 2023-08-22 DIAGNOSIS — R2681 Unsteadiness on feet: Secondary | ICD-10-CM | POA: Diagnosis not present

## 2023-08-22 DIAGNOSIS — R278 Other lack of coordination: Secondary | ICD-10-CM | POA: Diagnosis not present

## 2023-08-22 DIAGNOSIS — M6281 Muscle weakness (generalized): Secondary | ICD-10-CM

## 2023-08-22 DIAGNOSIS — R29818 Other symptoms and signs involving the nervous system: Secondary | ICD-10-CM | POA: Diagnosis not present

## 2023-08-22 NOTE — Therapy (Signed)
OUTPATIENT PHYSICAL THERAPY NEURO TREATMENT   Patient [name] MRN: [id number] DOB:[birth year], 76 y.o., male Today's Date: 08/22/2023   PCP: Emilio Aspen, MD REFERRING PROVIDER: Vladimir Faster, DO  END OF SESSION:  PT End of Session - 08/22/23 1322     Visit Number 7    Number of Visits 17   Plus eval   Date for PT Re-Evaluation 10/08/23    Authorization Type [employer] Medicare    PT Start Time 1320    PT Stop Time 1400    PT Time Calculation (min) 40 min    Equipment Utilized During Treatment Gait belt    Activity Tolerance Patient tolerated treatment well    Behavior During Therapy WFL for tasks assessed/performed                Past Medical History:  Diagnosis Date   Abnormal involuntary movement 02/18/2018   Allergic rhinitis    Arthritis    BPH (benign prostatic hyperplasia)    Diabetes (HCC)    Dysphagia, pharyngeal phase    GERD diagnosed on barium swallow. Has small hiatal hernia. Symptomatically somewhat better on omeprazole but not entirely. We'll try b.i.d. therapy   Edema    1+ in both ankles, likely multifactorial including medication such as Requip   Erectile dysfunction    Staxyn 10 mg or Viagra worked well. 3 samples of Cialis 20 mg provided   GERD (gastroesophageal reflux disease)    Hypercholesteremia    Hypertension    Nephrolithiasis    Onychomycosis of toenail    April 27, 2013 - Dr. Merwyn Katos - podiatry, was in [place] - treating with oral Lamisil and topical nail therapy   Parkinson's disease [facility])     followed by Dr. Raquel Sarna at [facility] and Lesia Sago, M.D. in [place]   Presbycusis    and tinnitus - Dr. Serena Colonel - August/2013   Renal calculus    Syncope    Past Surgical History:  Procedure Laterality Date   CORONARY STENT INTERVENTION N/A 01/11/2023   Procedure: CORONARY STENT INTERVENTION;  Surgeon: Kathleene Hazel, MD;  Location: [facility];  Service: Cardiovascular;  Laterality: N/A;   HAND  SURGERY     INGUINAL HERNIA REPAIR Left 11/04/2015   Procedure: LEFT INGUINAL HERNIA REPAIR WITH MESH;  Surgeon: Darnell Level, MD;  Location: [facility];  Service: General;  Laterality: Left;   INSERTION OF MESH Left 11/04/2015   Procedure: INSERTION OF MESH;  Surgeon: Darnell Level, MD;  Location: [facility];  Service: General;  Laterality: Left;   JOINT REPLACEMENT Bilateral    KNEE SURGERY     LEFT HEART CATH AND CORONARY ANGIOGRAPHY N/A 01/11/2023   Procedure: LEFT HEART CATH AND CORONARY ANGIOGRAPHY;  Surgeon: Kathleene Hazel, MD;  Location: [facility];  Service: Cardiovascular;  Laterality: N/A;   TOTAL KNEE ARTHROPLASTY     Patient Active Problem List   Diagnosis Date Noted   Gross hematuria 07/03/2023   Depression 06/24/2023   Chemosis 06/24/2023   Elevated troponin 04/03/2023   Sepsis (HCC) 04/01/2023   Coronary artery disease involving native coronary 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 angina pectoris 01/11/2023   Chronic combined systolic and diastolic heart failure (HCC)    Dysphagia 04/29/2022   AKI (acute kidney injury) (HCC)    Dehydration    Failure to thrive (child)    Bacteriuria, asymptomatic    Failure to thrive in adult 04/20/2022  Malaise, possible ESBL Klebsiella UTI 04/20/2022   Chronic retention of urine 04/20/2022   Closed lumbar vertebral fracture (HCC) 04/20/2022   UTI (urinary tract infection) 03/01/2022   Leukocytosis 01/01/2022   Hyponatremia 01/01/2022   CKD (chronic kidney disease), stage IIIa (HCC) 01/01/2022   HTN (hypertension) 12/31/2021   History of ESBL Klebsiella pneumoniae infection 12/31/2021   Generalized weakness 12/31/2021   Chronic venous insufficiency 08/29/2021   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   Encounter for general adult medical examination with abnormal findings 02/18/2018   Enlarged prostate 02/18/2018   Hearing loss 02/18/2018   Other long term (current) drug therapy  02/18/2018   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) 02/18/2018   HLD (hyperlipidemia)    Nephrolithiasis    Parkinson disease (HCC)    Diabetes mellitus with coincident hypertension (HCC)    Syncope    Renal calculus    Diabetes (HCC)    Allergic rhinitis    BPH (benign prostatic hyperplasia)    Erectile dysfunction    Dysphagia, pharyngeal phase    Edema    Ureteral calculus 02/09/2013   LEG PAIN 03/14/2010    ONSET DATE: 07/15/2023 (referral)   REFERRING DIAG: G20.B2 (ICD-10-CM) - Parkinson's disease with dyskinesia and fluctuating manifestations (HCC) R26.89 (ICD-10-CM) - Balance problem W19.XXXD (ICD-10-CM) - Fall, subsequent encounter  THERAPY DIAG:  Other abnormalities of gait and mobility  Unsteadiness on feet  Muscle weakness (generalized)  Rationale for Evaluation and Treatment: Rehabilitation  SUBJECTIVE:                                                                                                                                                                                             SUBJECTIVE STATEMENT: No falls, ambulates into clinic with SPC. States his freezing is the worst if he is pushing a heavy door open. Brought his U-step order today. No falls.   Pt accompanied by:  Self  PERTINENT HISTORY: HTN, chronic bilateral lower extremity edema, GERD, HLD, BPH, parkinson's disease  PAIN:  Are you having pain? No  PRECAUTIONS: Fall  RED FLAGS: None   WEIGHT BEARING RESTRICTIONS: No  FALLS: Has patient fallen in last 6 months? Yes. Number of falls Multiple- unsure the number   LIVING ENVIRONMENT: Lives with: lives with their spouse Lives in: House/apartment - pt reports he lives in the basement due to a fall down the steps last year that resulted in spinal fx  Stairs: Yes: Internal: full flight steps; on right going up Has following equipment at home: Single point cane, Walker - 2 wheeled, Environmental consultant - 4 wheeled, shower chair, and [employer]  bars  PLOF: Requires assistive device for independence, Needs assistance with ADLs, and Needs assistance with homemaking  PATIENT GOALS: "to improve balance and strength"   OBJECTIVE:  Note: Objective measures were completed at Evaluation unless otherwise noted.  DIAGNOSTIC FINDINGS: MRI of brain on 07/05/23   IMPRESSION: 1. No acute intracranial abnormality. 2. Mild for age signal changes in the cerebral white matter and a solitary chronic microhemorrhage in the right midbrain most compatible with chronic small vessel disease.  CT of chest on 07/03/23  IMPRESSION: 1. Minimally displaced left sixth rib fracture, without pneumothorax or effusion. 2. Healing left seventh and eighth rib fractures. 3. Cholelithiasis. 4. Left nephrolithiasis without hydronephrosis. 5. Chronic L1 compression fracture deformity. 6. Coronary and aortic Atherosclerosis (ICD10-I70.0).  COGNITION: Overall cognitive status: Impaired and extremely poor insight into deficits    SENSATION: Pt denies numbness/tingling    EDEMA: Chronic BLE edema    POSTURE: rounded shoulders, forward head, increased thoracic kyphosis, and weight shift right  LOWER EXTREMITY MMT:    MMT Right Eval Left Eval  Hip flexion    Hip extension    Hip abduction    Hip adduction    Hip internal rotation    Hip external rotation    Knee flexion    Knee extension    Ankle dorsiflexion    Ankle plantarflexion    Ankle inversion    Ankle eversion    (Blank rows = not tested)  BED MOBILITY:  Independent per pt   TRANSFERS: Assistive device utilized: Single point cane  Sit to stand: CGA Stand to sit: CGA Pt required min A and mod verbal cues to fully turn and back up prior to sitting in chair, as he almost missed chair when walking into evaluation room. Heavy reliance on BUE support w/poor anterior weight shift. Unable to obtain full hip/knee extension in stance  VITALS  There were no vitals filed for this  visit.   TODAY'S TREATMENT:   Ther Ex  SciFit multi-peaks level 12.0 for 8 minutes using BUE/BLEs for neural priming for reciprocal movement, dynamic cardiovascular warmup and increased amplitude of stepping.   NMR  In // bars for improved freezing strategies, functional core strength and imitation of holding grandchild w/gait:  Fwd/retro farmer's carries w/12# KB, x20' each direction per side w/SBA- CGA for safety. Increased freezing noted when holding KB on R side compared to L and only occurred w/retro gait. Min cues for large steps w/hip abduction in retro direction, which did improve freezing.  Practiced opening door (pushing) w/PT student providing resistance, x3 reps, to work on freezing techniques as pt reports this is the most difficult task for him. Min cues to stand close to door handle rather than reach out of BOS to grab handle and pt able to facilitate improved lateral weight shift. No instability noted.  Outside on sidewalk around building w/inclines and declines (~500'), performed red/green/yellow light w/SPC for improved go/no-go, reaction time and anticipatory balance strategies. Pt most challenged w/transition from yellow to green light, as this triggered freezing. No LOB noted w/activity  At mat table, alt lateral step w/slam ball throw, x8 reps per side, for improved lateral weight shifting, dual-tasking and step length/clearance. No difficulty noted w/task, so added cog dual-task (naming animals when stepping to R and foods when stepping to L). With added task, pt demonstrated reduced step length/clearance of LLE > RLE and frequently named incorrect object w/step, requiring min-mod verbal cues to correct. Pt very fatigued w/task but no LOB  noted.    GAIT: Gait pattern: step through pattern, decreased arm swing- Right, decreased arm swing- Left, decreased stride length, decreased hip/knee flexion- Right, decreased hip/knee flexion- Left, decreased ankle dorsiflexion- Right,  decreased ankle dorsiflexion- Left, shuffling, lateral hip instability, decreased trunk rotation, trunk flexed, and poor foot clearance- Right Distance walked: Various clinic distances  Assistive device utilized: Single point cane Level of assistance: SBA Comments: Pt demonstrating improvement in freezing during session today. No cues required for increased stride length or to slow down.   PATIENT EDUCATION: Education details: Continue HEP, maintaining close distance to doors prior to pushing them open to assist w/freezing  Person educated: Patient Education method: Explanation, Demonstration, and Verbal cues Education comprehension: returned demonstration, verbal cues required, and needs further education  HOME EXERCISE PROGRAM: Standing PWR Moves   Access Code: [email or web address] URL: [email or web address] Date: 08/08/2023 Prepared by: Alethia Berthold Britanee Vanblarcom  Exercises - Standing Quarter Turn with Counter Support  - 1 x daily - 7 x weekly - 3 sets - 10 reps  Access Code: [email or web address] URL: [email or web address] Date: 08/15/2023 Prepared by: Alethia Berthold Madelein Mahadeo  Exercises - Sit to Stand Without Arm Support  - 1 x daily - 7 x weekly - 3 sets - 10 reps  GOALS: Goals reviewed with patient? Yes  SHORT TERM GOALS: Target date: 08/27/2023   Pt will perform initial HEP w/CGA from caregiver for improved strength, balance, transfers and gait.  Baseline: not established on eval  Goal status: INITIAL  2.  Pt will improve 5 x STS to less than or equal to 15 seconds w/proper body mechanics to demonstrate improved functional strength and transfer efficiency.   Baseline: 13.41s w/BUE support and improper body mechanics  Goal status: INITIAL  3.  Pt will be compliant w/use of RW at all times for reduced fall frequency and independence  Baseline: Pt not using  Goal status: INITIAL  4.  Pt and wife will verbalize understanding of freezing strategies to implement at home and in  community for reduced fall frequency.   Baseline:  Goal status: INITIAL  5.  Pt will improve normal TUG to less than or equal to 15 seconds w/safest AD and CGA for improved functional mobility and decreased fall risk.  Baseline: 15.68s w/SPC w/min A Goal status: REVISED     LONG TERM GOALS: Target date: 09/24/2023   Pt will perform final HEP w/SBA from caregiver for improved strength, balance, transfers and gait.  Baseline:  Goal status: INITIAL  2.  Pt will improve gait velocity to at least 2.9 ft/s w/LRAD mod I for improved gait efficiency and independence  Baseline: 2.7 ft/s w/SPC and CGA-min A Goal status: INITIAL  3.  Pt will improve 5 x STS to less than or equal to 13 seconds w/proper body mechanics to demonstrate improved functional strength and transfer efficiency.   Baseline: 13.41s w/BUE support and improper body mechanics  Goal status: INITIAL  4.  Pt will improve normal TUG to less than or equal to 13 seconds w/safest AD and SBA for improved functional mobility and decreased fall risk.  Baseline: 15.68s w/SPC w/min A Goal status: REVISED   5.  Pt will verbalize understanding of local PD community resources, including fitness post DC.   Baseline:  Goal status: INITIAL   ASSESSMENT:  CLINICAL IMPRESSION:  Emphasis of skilled PT session on continued freezing strategies, anticipatory balance, lateral weight shifting and dual-tasking. Pt much more stable w/SPC this date as he is able to self-cue to slow down and focus  on large steps. Pt continues to be challenged with freezing when opening doors, especially when they are heavy, but did improve when cued to maintain close distance to door rather than reach out of BOS. Pt challenged w/varying gait speed, with most freezing noted when transitioning from slow to fast. Continue POC.     OBJECTIVE IMPAIRMENTS: Abnormal gait, decreased balance, decreased cognition, decreased knowledge of condition, decreased knowledge  of use of DME, decreased mobility, difficulty walking, decreased strength, decreased safety awareness, impaired perceived functional ability, improper body mechanics, and pain  ACTIVITY LIMITATIONS: carrying, lifting, bending, standing, squatting, stairs, transfers, reach over head, hygiene/grooming, and locomotion level  PARTICIPATION LIMITATIONS: meal prep, cleaning, laundry, medication management, interpersonal relationship, driving, shopping, community activity, occupation, and yard work  PERSONAL FACTORS: Age, Behavior pattern, Fitness, Past/current experiences, Transportation, and 1 comorbidity: PD  are also affecting patient's functional outcome.   REHAB POTENTIAL: Fair due to poor compliance w/therapy recommendations  CLINICAL DECISION MAKING: Evolving/moderate complexity  EVALUATION COMPLEXITY: Moderate  PLAN:  PT FREQUENCY: 2x/week  PT DURATION: 8 weeks (POC written for 10 weeks due to delay in scheduling)   PLANNED INTERVENTIONS: 16109- PT Re-evaluation, 97110-Therapeutic exercises, 97530- Therapeutic activity, 97112- Neuromuscular re-education, 97535- Self Care, [id number]- Manual therapy, [id number]- Gait training, Balance training, Stair training, and DME instructions  PLAN FOR NEXT SESSION: Continue to use U-step. Extensive education needed on safety at home. Add to HEP for improved transfers, posture. Freezing techniques. Pt wants to try TM. Reciprocal coordination, lateral weight shifting, turns   Jill Alexanders Aseem Sessums, PT, DPT 08/22/2023, 2:02 PM

## 2023-08-26 ENCOUNTER — Ambulatory Visit: Payer: Medicare Other | Admitting: Physical Therapy

## 2023-08-26 ENCOUNTER — Encounter: Payer: Self-pay | Admitting: Physical Therapy

## 2023-08-26 DIAGNOSIS — R278 Other lack of coordination: Secondary | ICD-10-CM | POA: Diagnosis not present

## 2023-08-26 DIAGNOSIS — M6281 Muscle weakness (generalized): Secondary | ICD-10-CM

## 2023-08-26 DIAGNOSIS — R29818 Other symptoms and signs involving the nervous system: Secondary | ICD-10-CM | POA: Diagnosis not present

## 2023-08-26 DIAGNOSIS — R2689 Other abnormalities of gait and mobility: Secondary | ICD-10-CM | POA: Diagnosis not present

## 2023-08-26 DIAGNOSIS — R471 Dysarthria and anarthria: Secondary | ICD-10-CM | POA: Diagnosis not present

## 2023-08-26 DIAGNOSIS — R2681 Unsteadiness on feet: Secondary | ICD-10-CM | POA: Diagnosis not present

## 2023-08-26 NOTE — Therapy (Signed)
OUTPATIENT PHYSICAL THERAPY NEURO TREATMENT   Patient [name] MRN: [id number] DOB:1946/02/05, 77 y.o., male Today's Date: 08/26/2023   PCP: Emilio Aspen, MD REFERRING PROVIDER: Vladimir Faster, DO  END OF SESSION:  PT End of Session - 08/26/23 1319     Visit Number 8    Number of Visits 17   Plus eval   Date for PT Re-Evaluation 10/08/23    Authorization Type [employer] Medicare    PT Start Time 1317    PT Stop Time 1358    PT Time Calculation (min) 41 min    Equipment Utilized During Treatment Gait belt    Activity Tolerance Patient tolerated treatment well    Behavior During Therapy WFL for tasks assessed/performed                Past Medical History:  Diagnosis Date   Abnormal involuntary movement 02/18/2018   Allergic rhinitis    Arthritis    BPH (benign prostatic hyperplasia)    Diabetes (HCC)    Dysphagia, pharyngeal phase    GERD diagnosed on barium swallow. Has small hiatal hernia. Symptomatically somewhat better on omeprazole but not entirely. We'll try b.i.d. therapy   Edema    1+ in both ankles, likely multifactorial including medication such as Requip   Erectile dysfunction    Staxyn 10 mg or Viagra worked well. 3 samples of Cialis 20 mg provided   GERD (gastroesophageal reflux disease)    Hypercholesteremia    Hypertension    Nephrolithiasis    Onychomycosis of toenail    April 27, 2013 - Dr. Merwyn Katos - podiatry, was in [place] - treating with oral Lamisil and topical nail therapy   Parkinson's disease [facility])     followed by Dr. Raquel Sarna at [facility] and Lesia Sago, M.D. in [place]   Presbycusis    and tinnitus - Dr. Serena Colonel - August/2013   Renal calculus    Syncope    Past Surgical History:  Procedure Laterality Date   CORONARY STENT INTERVENTION N/A 01/11/2023   Procedure: CORONARY STENT INTERVENTION;  Surgeon: Kathleene Hazel, MD;  Location: [facility];  Service: Cardiovascular;  Laterality: N/A;   HAND  SURGERY     INGUINAL HERNIA REPAIR Left 11/04/2015   Procedure: LEFT INGUINAL HERNIA REPAIR WITH MESH;  Surgeon: Darnell Level, MD;  Location: [facility];  Service: General;  Laterality: Left;   INSERTION OF MESH Left 11/04/2015   Procedure: INSERTION OF MESH;  Surgeon: Darnell Level, MD;  Location: [facility];  Service: General;  Laterality: Left;   JOINT REPLACEMENT Bilateral    KNEE SURGERY     LEFT HEART CATH AND CORONARY ANGIOGRAPHY N/A 01/11/2023   Procedure: LEFT HEART CATH AND CORONARY ANGIOGRAPHY;  Surgeon: Kathleene Hazel, MD;  Location: [facility];  Service: Cardiovascular;  Laterality: N/A;   TOTAL KNEE ARTHROPLASTY     Patient Active Problem List   Diagnosis Date Noted   Gross hematuria 07/03/2023   Depression 06/24/2023   Chemosis 06/24/2023   Elevated troponin 04/03/2023   Sepsis (HCC) 04/01/2023   Coronary artery disease involving native coronary 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 angina pectoris 01/11/2023   Chronic combined systolic and diastolic heart failure (HCC)    Dysphagia 04/29/2022   AKI (acute kidney injury) (HCC)    Dehydration    Failure to thrive (child)    Bacteriuria, asymptomatic    Failure to thrive in adult 04/20/2022  Malaise, possible ESBL Klebsiella UTI 04/20/2022   Chronic retention of urine 04/20/2022   Closed lumbar vertebral fracture (HCC) 04/20/2022   UTI (urinary tract infection) 03/01/2022   Leukocytosis 01/01/2022   Hyponatremia 01/01/2022   CKD (chronic kidney disease), stage IIIa (HCC) 01/01/2022   HTN (hypertension) 12/31/2021   History of ESBL Klebsiella pneumoniae infection 12/31/2021   Generalized weakness 12/31/2021   Chronic venous insufficiency 08/29/2021   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   Encounter for general adult medical examination with abnormal findings 02/18/2018   Enlarged prostate 02/18/2018   Hearing loss 02/18/2018   Other long term (current) drug therapy  02/18/2018   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) 02/18/2018   HLD (hyperlipidemia)    Nephrolithiasis    Parkinson disease (HCC)    Diabetes mellitus with coincident hypertension (HCC)    Syncope    Renal calculus    Diabetes (HCC)    Allergic rhinitis    BPH (benign prostatic hyperplasia)    Erectile dysfunction    Dysphagia, pharyngeal phase    Edema    Ureteral calculus 02/09/2013   LEG PAIN 03/14/2010    ONSET DATE: 07/15/2023 (referral)   REFERRING DIAG: G20.B2 (ICD-10-CM) - Parkinson's disease with dyskinesia and fluctuating manifestations (HCC) R26.89 (ICD-10-CM) - Balance problem W19.XXXD (ICD-10-CM) - Fall, subsequent encounter  THERAPY DIAG:  Other abnormalities of gait and mobility  Unsteadiness on feet  Muscle weakness (generalized)  Rationale for Evaluation and Treatment: Rehabilitation  SUBJECTIVE:                                                                                                                                                                                             SUBJECTIVE STATEMENT: No falls, no stumbles. Reports feels like freezing is getting better. PWR moves are going great   Pt accompanied by:  Self  PERTINENT HISTORY: HTN, chronic bilateral lower extremity edema, GERD, HLD, BPH, parkinson's disease  PAIN:  Are you having pain? No  PRECAUTIONS: Fall  RED FLAGS: None   WEIGHT BEARING RESTRICTIONS: No  FALLS: Has patient fallen in last 6 months? Yes. Number of falls Multiple- unsure the number   LIVING ENVIRONMENT: Lives with: lives with their spouse Lives in: House/apartment - pt reports he lives in the basement due to a fall down the steps last year that resulted in spinal fx  Stairs: Yes: Internal: full flight steps; on right going up Has following equipment at home: Single point cane, Walker - 2 wheeled, Environmental consultant - 4 wheeled, shower chair, and Grab bars  PLOF: Requires assistive device for independence,  Needs assistance with ADLs,  and Needs assistance with homemaking  PATIENT GOALS: "to improve balance and strength"   OBJECTIVE:  Note: Objective measures were completed at Evaluation unless otherwise noted.  DIAGNOSTIC FINDINGS: MRI of brain on 07/05/23   IMPRESSION: 1. No acute intracranial abnormality. 2. Mild for age signal changes in the cerebral white matter and a solitary chronic microhemorrhage in the right midbrain most compatible with chronic small vessel disease.  CT of chest on 07/03/23  IMPRESSION: 1. Minimally displaced left sixth rib fracture, without pneumothorax or effusion. 2. Healing left seventh and eighth rib fractures. 3. Cholelithiasis. 4. Left nephrolithiasis without hydronephrosis. 5. Chronic L1 compression fracture deformity. 6. Coronary and aortic Atherosclerosis (ICD10-I70.0).  COGNITION: Overall cognitive status: Impaired and extremely poor insight into deficits    SENSATION: Pt denies numbness/tingling    EDEMA: Chronic BLE edema    POSTURE: rounded shoulders, forward head, increased thoracic kyphosis, and weight shift right  LOWER EXTREMITY MMT:    MMT Right Eval Left Eval  Hip flexion    Hip extension    Hip abduction    Hip adduction    Hip internal rotation    Hip external rotation    Knee flexion    Knee extension    Ankle dorsiflexion    Ankle plantarflexion    Ankle inversion    Ankle eversion    (Blank rows = not tested)  BED MOBILITY:  Independent per pt   TRANSFERS: Assistive device utilized: Single point cane  Sit to stand: CGA Stand to sit: CGA Pt required min A and mod verbal cues to fully turn and back up prior to sitting in chair, as he almost missed chair when walking into evaluation room. Heavy reliance on BUE support w/poor anterior weight shift. Unable to obtain full hip/knee extension in stance  VITALS  There were no vitals filed for this visit.   TODAY'S TREATMENT:    Therapeutic Activity:  5x sit  <> stand: 15.8 seconds with no UE support from chair, with proper body mechanics and improved anterior weight shifting  TUG: 15.6 seconds with SPC, SBA/CGA no freezing episodes   Went over freezing strategies - going over strategies with using a heavier door as practiced in previous sessions, pt reporting slowing down and keeping an object in the distance as his target. Also reporting stopping and using weight shifting to help as well. Pt reports an improvement in freezing strategies since beginning therapy.   NMR   Standing PWR Flow x6 reps with demo cues from therapist  Up > Rock > Twist > Step Pt needing cues to look at hands and incr foot clearance/step height when performing PWR Step  With 6 blaze pods (3 on bottom step and 3 on 2nd step), working on weight shifting, SLS stability, foot clearance, visual scanning, reaction times. With pt standing on air ex, pt initially needing frequent UE support for balance  Performed 4 bouts of 1 minute each: 38 hits (with BUE support), 28 hits (trying to go without UE support at times), 23 hits, 17 hits (lessening UE support as reps went on), CGA/min A at times for balance without UE support Cued to alternate between R/L foot when tapping  From mat table, standing on air ex with holding 4# medicine ball, performing sit > stand and med ball slam to floor and catch and sitting back down x10 reps, cues to lift ball over head before slamming it to floor, performed an additional 10 reps with pt holding 6# ball, intermittent cues  to scoot out towards edge first for incr ease of anterior weight shift  Pt reporting RPE as 6/10  With multi-directional stepping sheet for stepping strategies and set switching:  Following stepping directions x2 reps Stating the color instead of directions x3 reps Doing the opposite of R/L x3 reps, pt with incr challenge with this Cued for larger steps    GAIT: Gait pattern: step through pattern, decreased arm swing- Right,  decreased arm swing- Left, decreased stride length, decreased hip/knee flexion- Right, decreased hip/knee flexion- Left, decreased ankle dorsiflexion- Right, decreased ankle dorsiflexion- Left, shuffling, lateral hip instability, decreased trunk rotation, trunk flexed, and poor foot clearance- Right Distance walked: Various clinic distances  Assistive device utilized: Single point cane Level of assistance: SBA Comments: Pt demonstrating improvement in freezing during session today. No cues required for increased stride length or to slow down.   PATIENT EDUCATION: Education details: Continue HEP, results of goals,reviewed freezing strategies  Person educated: Patient Education method: Explanation, Demonstration, and Verbal cues Education comprehension: returned demonstration, verbal cues required, and needs further education  HOME EXERCISE PROGRAM: Standing PWR Moves   Access Code: [email or web address] URL: [email or web address] Date: 08/08/2023 Prepared by: Alethia Berthold Plaster  Exercises - Standing Quarter Turn with Counter Support  - 1 x daily - 7 x weekly - 3 sets - 10 reps  Access Code: [email or web address] URL: [email or web address] Date: 08/15/2023 Prepared by: Alethia Berthold Plaster  Exercises - Sit to Stand Without Arm Support  - 1 x daily - 7 x weekly - 3 sets - 10 reps  GOALS: Goals reviewed with patient? Yes  SHORT TERM GOALS: Target date: 08/27/2023   Pt will perform initial HEP w/CGA from caregiver for improved strength, balance, transfers and gait.  Baseline: pt reports performing HEP on his own at home  Goal status: MET   2.  Pt will improve 5 x STS to less than or equal to 15 seconds w/proper body mechanics to demonstrate improved functional strength and transfer efficiency.   Baseline: 13.41s w/BUE support and improper body mechanics   15.8 seconds with no UE support, with proper body mechanics  Goal status: PARTIALLY MET  3.  Pt will be compliant w/use of RW  at all times for reduced fall frequency and independence  Baseline: Pt not using   Pt using SPC, not safe with RW  Goal status: N/A  4.  Pt and wife will verbalize understanding of freezing strategies to implement at home and in community for reduced fall frequency.   Baseline:  Goal status: MET  5.  Pt will improve normal TUG to less than or equal to 15 seconds w/safest AD and CGA for improved functional mobility and decreased fall risk.  Baseline: 15.68s w/SPC w/min A  15.6 seconds with SPC, SBA/CGA no freezing episodes  Goal status: PARTIALLY MET    LONG TERM GOALS: Target date: 09/24/2023   Pt will perform final HEP w/SBA from caregiver for improved strength, balance, transfers and gait.  Baseline:  Goal status: INITIAL  2.  Pt will improve gait velocity to at least 2.9 ft/s w/LRAD mod I for improved gait efficiency and independence  Baseline: 2.7 ft/s w/SPC and CGA-min A Goal status: INITIAL  3.  Pt will improve 5 x STS to less than or equal to 13 seconds w/proper body mechanics to demonstrate improved functional strength and transfer efficiency.   Baseline: 13.41s w/BUE support and improper body mechanics  Goal status: INITIAL  4.  Pt will improve normal TUG to less than or  equal to 13 seconds w/safest AD and SBA for improved functional mobility and decreased fall risk.  Baseline: 15.68s w/SPC w/min A Goal status: REVISED   5.  Pt will verbalize understanding of local PD community resources, including fitness post DC.   Baseline:  Goal status: INITIAL   ASSESSMENT:  CLINICAL IMPRESSION:  Today's skilled session focused on assessing STGs. Pt has met goals in regards to HEP and being able to verbalize different freezing strategies. Pt also subjectively reports that freezing episodes have been better since he started therapy. Pt partially met goals in regards to 5x sit <> stand and TUG. Pt's times remained similar compared to eval, but pt with much better  quality of movement and needing less assist. Pt able to perform TUG today without freezing (at eval had significant freezing). And pt able to demo improved anterior weight shift for sit <> stands with no UE support (previously needed BUE support). Remainder of session focused on balance strategies for SLS, reactive balance, and stepping strategies. Pt with no episodes of freezing during session today. Continue POC.     OBJECTIVE IMPAIRMENTS: Abnormal gait, decreased balance, decreased cognition, decreased knowledge of condition, decreased knowledge of use of DME, decreased mobility, difficulty walking, decreased strength, decreased safety awareness, impaired perceived functional ability, improper body mechanics, and pain  ACTIVITY LIMITATIONS: carrying, lifting, bending, standing, squatting, stairs, transfers, reach over head, hygiene/grooming, and locomotion level  PARTICIPATION LIMITATIONS: meal prep, cleaning, laundry, medication management, interpersonal relationship, driving, shopping, community activity, occupation, and yard work  PERSONAL FACTORS: Age, Behavior pattern, Fitness, Past/current experiences, Transportation, and 1 comorbidity: PD  are also affecting patient's functional outcome.   REHAB POTENTIAL: Fair due to poor compliance w/therapy recommendations  CLINICAL DECISION MAKING: Evolving/moderate complexity  EVALUATION COMPLEXITY: Moderate  PLAN:  PT FREQUENCY: 2x/week  PT DURATION: 8 weeks (POC written for 10 weeks due to delay in scheduling)   PLANNED INTERVENTIONS: 29562- PT Re-evaluation, 97110-Therapeutic exercises, 97530- Therapeutic activity, 97112- Neuromuscular re-education, 97535- Self Care, 13086- Manual therapy, [id number]- Gait training, Balance training, Stair training, and DME instructions  PLAN FOR NEXT SESSION: Continue to use U-step. Extensive education needed on safety at home. Add to HEP for improved transfers, posture. Freezing techniques. Pt wants to try  TM. Reciprocal coordination, lateral weight shifting, turns   Drake Leach, PT, DPT 08/26/2023, 2:01 PM

## 2023-08-29 ENCOUNTER — Ambulatory Visit: Payer: Medicare Other | Admitting: Physical Therapy

## 2023-08-29 DIAGNOSIS — R29818 Other symptoms and signs involving the nervous system: Secondary | ICD-10-CM | POA: Diagnosis not present

## 2023-08-29 DIAGNOSIS — M6281 Muscle weakness (generalized): Secondary | ICD-10-CM | POA: Diagnosis not present

## 2023-08-29 DIAGNOSIS — R2681 Unsteadiness on feet: Secondary | ICD-10-CM

## 2023-08-29 DIAGNOSIS — R278 Other lack of coordination: Secondary | ICD-10-CM | POA: Diagnosis not present

## 2023-08-29 DIAGNOSIS — R2689 Other abnormalities of gait and mobility: Secondary | ICD-10-CM

## 2023-08-29 DIAGNOSIS — R471 Dysarthria and anarthria: Secondary | ICD-10-CM | POA: Diagnosis not present

## 2023-08-29 NOTE — Therapy (Signed)
OUTPATIENT PHYSICAL THERAPY NEURO TREATMENT   Patient [name] MRN: [id number] DOB:14-Apr-1946, 77 y.o., male Today's Date: 08/29/2023   PCP: Emilio Aspen, MD REFERRING PROVIDER: Vladimir Faster, DO  END OF SESSION:  PT End of Session - 08/29/23 1317     Visit Number 9    Number of Visits 17   Plus eval   Date for PT Re-Evaluation 10/08/23    Authorization Type [employer] Medicare    PT Start Time 1316    PT Stop Time 1359    PT Time Calculation (min) 43 min    Equipment Utilized During Treatment Gait belt    Activity Tolerance Patient tolerated treatment well    Behavior During Therapy WFL for tasks assessed/performed                 Past Medical History:  Diagnosis Date   Abnormal involuntary movement 02/18/2018   Allergic rhinitis    Arthritis    BPH (benign prostatic hyperplasia)    Diabetes (HCC)    Dysphagia, pharyngeal phase    GERD diagnosed on barium swallow. Has small hiatal hernia. Symptomatically somewhat better on omeprazole but not entirely. We'll try b.i.d. therapy   Edema    1+ in both ankles, likely multifactorial including medication such as Requip   Erectile dysfunction    Staxyn 10 mg or Viagra worked well. 3 samples of Cialis 20 mg provided   GERD (gastroesophageal reflux disease)    Hypercholesteremia    Hypertension    Nephrolithiasis    Onychomycosis of toenail    April 27, 2013 - Dr. Merwyn Katos - podiatry, was in [place] - treating with oral Lamisil and topical nail therapy   Parkinson's disease [facility])     followed by Dr. Raquel Sarna at [facility] and Lesia Sago, M.D. in [place]   Presbycusis    and tinnitus - Dr. Serena Colonel - August/2013   Renal calculus    Syncope    Past Surgical History:  Procedure Laterality Date   CORONARY STENT INTERVENTION N/A 01/11/2023   Procedure: CORONARY STENT INTERVENTION;  Surgeon: Kathleene Hazel, MD;  Location: [facility];  Service: Cardiovascular;  Laterality: N/A;    HAND SURGERY     INGUINAL HERNIA REPAIR Left 11/04/2015   Procedure: LEFT INGUINAL HERNIA REPAIR WITH MESH;  Surgeon: Darnell Level, MD;  Location: [facility];  Service: General;  Laterality: Left;   INSERTION OF MESH Left 11/04/2015   Procedure: INSERTION OF MESH;  Surgeon: Darnell Level, MD;  Location: [facility];  Service: General;  Laterality: Left;   JOINT REPLACEMENT Bilateral    KNEE SURGERY     LEFT HEART CATH AND CORONARY ANGIOGRAPHY N/A 01/11/2023   Procedure: LEFT HEART CATH AND CORONARY ANGIOGRAPHY;  Surgeon: Kathleene Hazel, MD;  Location: [facility];  Service: Cardiovascular;  Laterality: N/A;   TOTAL KNEE ARTHROPLASTY     Patient Active Problem List   Diagnosis Date Noted   Gross hematuria 07/03/2023   Depression 06/24/2023   Chemosis 06/24/2023   Elevated troponin 04/03/2023   Sepsis (HCC) 04/01/2023   Coronary artery disease involving native coronary 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 angina pectoris 01/11/2023   Chronic combined systolic and diastolic heart failure (HCC)    Dysphagia 04/29/2022   AKI (acute kidney injury) (HCC)    Dehydration    Failure to thrive (child)    Bacteriuria, asymptomatic    Failure to thrive in adult  04/20/2022   Malaise, possible ESBL Klebsiella UTI 04/20/2022   Chronic retention of urine 04/20/2022   Closed lumbar vertebral fracture (HCC) 04/20/2022   UTI (urinary tract infection) 03/01/2022   Leukocytosis 01/01/2022   Hyponatremia 01/01/2022   CKD (chronic kidney disease), stage IIIa (HCC) 01/01/2022   HTN (hypertension) 12/31/2021   History of ESBL Klebsiella pneumoniae infection 12/31/2021   Generalized weakness 12/31/2021   Chronic venous insufficiency 08/29/2021   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   Encounter for general adult medical examination with abnormal findings 02/18/2018   Enlarged prostate 02/18/2018   Hearing loss 02/18/2018   Other long term (current) drug  therapy 02/18/2018   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) 02/18/2018   HLD (hyperlipidemia)    Nephrolithiasis    Parkinson disease (HCC)    Diabetes mellitus with coincident hypertension (HCC)    Syncope    Renal calculus    Diabetes (HCC)    Allergic rhinitis    BPH (benign prostatic hyperplasia)    Erectile dysfunction    Dysphagia, pharyngeal phase    Edema    Ureteral calculus 02/09/2013   LEG PAIN 03/14/2010    ONSET DATE: 07/15/2023 (referral)   REFERRING DIAG: G20.B2 (ICD-10-CM) - Parkinson's disease with dyskinesia and fluctuating manifestations (HCC) R26.89 (ICD-10-CM) - Balance problem W19.XXXD (ICD-10-CM) - Fall, subsequent encounter  THERAPY DIAG:  Other abnormalities of gait and mobility  Unsteadiness on feet  Muscle weakness (generalized)  Rationale for Evaluation and Treatment: Rehabilitation  SUBJECTIVE:                                                                                                                                                                                             SUBJECTIVE STATEMENT: Pt reports doing well, denies falls or acute changes.   Pt accompanied by:  Self  PERTINENT HISTORY: HTN, chronic bilateral lower extremity edema, GERD, HLD, BPH, parkinson's disease  PAIN:  Are you having pain? No  PRECAUTIONS: Fall  RED FLAGS: None   WEIGHT BEARING RESTRICTIONS: No  FALLS: Has patient fallen in last 6 months? Yes. Number of falls Multiple- unsure the number   LIVING ENVIRONMENT: Lives with: lives with their spouse Lives in: House/apartment - pt reports he lives in the basement due to a fall down the steps last year that resulted in spinal fx  Stairs: Yes: Internal: full flight steps; on right going up Has following equipment at home: Single point cane, Walker - 2 wheeled, Environmental consultant - 4 wheeled, shower chair, and Grab bars  PLOF: Requires assistive device for independence, Needs assistance with ADLs, and  Needs assistance with  homemaking  PATIENT GOALS: "to improve balance and strength"   OBJECTIVE:  Note: Objective measures were completed at Evaluation unless otherwise noted.  DIAGNOSTIC FINDINGS: MRI of brain on 07/05/23   IMPRESSION: 1. No acute intracranial abnormality. 2. Mild for age signal changes in the cerebral white matter and a solitary chronic microhemorrhage in the right midbrain most compatible with chronic small vessel disease.  CT of chest on 07/03/23  IMPRESSION: 1. Minimally displaced left sixth rib fracture, without pneumothorax or effusion. 2. Healing left seventh and eighth rib fractures. 3. Cholelithiasis. 4. Left nephrolithiasis without hydronephrosis. 5. Chronic L1 compression fracture deformity. 6. Coronary and aortic Atherosclerosis (ICD10-I70.0).  COGNITION: Overall cognitive status: Impaired and extremely poor insight into deficits    SENSATION: Pt denies numbness/tingling    EDEMA: Chronic BLE edema    POSTURE: rounded shoulders, forward head, increased thoracic kyphosis, and weight shift right  LOWER EXTREMITY MMT:    MMT Right Eval Left Eval  Hip flexion    Hip extension    Hip abduction    Hip adduction    Hip internal rotation    Hip external rotation    Knee flexion    Knee extension    Ankle dorsiflexion    Ankle plantarflexion    Ankle inversion    Ankle eversion    (Blank rows = not tested)  BED MOBILITY:  Independent per pt   TRANSFERS: Assistive device utilized: Single point cane  Sit to stand: CGA Stand to sit: CGA Pt required min A and mod verbal cues to fully turn and back up prior to sitting in chair, as he almost missed chair when walking into evaluation room. Heavy reliance on BUE support w/poor anterior weight shift. Unable to obtain full hip/knee extension in stance  VITALS  There were no vitals filed for this visit.   TODAY'S TREATMENT:    Gait Training  The following treadmill training was completed  for aerobic/neural priming, endurance, step clearance/length and gait speed.  - Warmup: 2:00 up to 1.2 mph w/BUE support. Min cues to initiate step w/heel strike rather than shuffling feet, which pt able to correct well  - HIIT: 4:00 30sec ON/OFF alternating ball kicks / regular walking at 1.6 mph   - Cool down: 2:00 at 1.6 mph   NMR  For improved stepping and reactive balance strategies, ambulated 345' around gym w/random posterolateral perturbations w/red resistance band at pelvis. CGA-min A throughout due to increased difficulty stepping to R side. Noted large crossover step to correct balance on L side.  For improved set-switching and stepping strategies in retro direction,  retro gait w/immediate switch to anterior gait when random posterior perturbation applied via red resistance band, x15 reps. Pt required min A initially due to significant freezing when attempting to switch from retro to fwd gait. Min cues to regain balance in retro direction fully prior to shifting weight forwards to minimize freezing. With cues, pt able to facilitate shift from retro to fwd w/minimal freezing. Noted increased shuffling of gait in retro direction.  While holding cup of water in L hand, ambulated >200' around gym and in narrow doorway to work on freezing strategies. Pt reports this has been a common cause of falls for him, but no freezing noted this date. Pt states he has learned he does better if he slows down rather than trying to rush his steps.   Ther Ex  Blue theraball walk outs, x10 each direction, for low back pain relief and improved spinal  mobility. Therapist noted significant popping in low back w/gait activities this date and pt reports he frequently has back discomfort due to history of back surgery. Pt states he used to have TPDN performed which helped and would be open to that again. Will add more low back exercises to HEP next session.     GAIT: Gait pattern: step through pattern, decreased arm  swing- Right, decreased arm swing- Left, decreased stride length, decreased hip/knee flexion- Right, decreased hip/knee flexion- Left, decreased ankle dorsiflexion- Right, decreased ankle dorsiflexion- Left, shuffling, lateral hip instability, decreased trunk rotation, trunk flexed, and poor foot clearance- Right Distance walked: Various clinic distances  Assistive device utilized: Single point cane Level of assistance: SBA Comments: Pt demonstrating improvement in freezing during session today. No cues required for increased stride length or to slow down.   PATIENT EDUCATION: Education details: Continue HEP, results of goals,reviewed freezing strategies  Person educated: Patient Education method: Explanation, Demonstration, and Verbal cues Education comprehension: returned demonstration, verbal cues required, and needs further education  HOME EXERCISE PROGRAM: Standing PWR Moves   Access Code: [email or web address] URL: [email or web address] Date: 08/08/2023 Prepared by: Alethia Berthold Everson Mott  Exercises - Standing Quarter Turn with Counter Support  - 1 x daily - 7 x weekly - 3 sets - 10 reps  Access Code: [email or web address] URL: [email or web address] Date: 08/15/2023 Prepared by: Alethia Berthold Kensi Karr  Exercises - Sit to Stand Without Arm Support  - 1 x daily - 7 x weekly - 3 sets - 10 reps  GOALS: Goals reviewed with patient? Yes  SHORT TERM GOALS: Target date: 08/27/2023   Pt will perform initial HEP w/CGA from caregiver for improved strength, balance, transfers and gait.  Baseline: pt reports performing HEP on his own at home  Goal status: MET   2.  Pt will improve 5 x STS to less than or equal to 15 seconds w/proper body mechanics to demonstrate improved functional strength and transfer efficiency.   Baseline: 13.41s w/BUE support and improper body mechanics   15.8 seconds with no UE support, with proper body mechanics  Goal status: PARTIALLY MET  3.  Pt will be compliant  w/use of RW at all times for reduced fall frequency and independence  Baseline: Pt not using   Pt using SPC, not safe with RW  Goal status: N/A  4.  Pt and wife will verbalize understanding of freezing strategies to implement at home and in community for reduced fall frequency.   Baseline:  Goal status: MET  5.  Pt will improve normal TUG to less than or equal to 15 seconds w/safest AD and CGA for improved functional mobility and decreased fall risk.  Baseline: 15.68s w/SPC w/min A  15.6 seconds with SPC, SBA/CGA no freezing episodes  Goal status: PARTIALLY MET    LONG TERM GOALS: Target date: 09/24/2023   Pt will perform final HEP w/SBA from caregiver for improved strength, balance, transfers and gait.  Baseline:  Goal status: INITIAL  2.  Pt will improve gait velocity to at least 2.9 ft/s w/LRAD mod I for improved gait efficiency and independence  Baseline: 2.7 ft/s w/SPC and CGA-min A Goal status: INITIAL  3.  Pt will improve 5 x STS to less than or equal to 13 seconds w/proper body mechanics to demonstrate improved functional strength and transfer efficiency.   Baseline: 13.41s w/BUE support and improper body mechanics  Goal status: INITIAL  4.  Pt will improve normal TUG to less than or equal to 13 seconds w/safest AD and  SBA for improved functional mobility and decreased fall risk.  Baseline: 15.68s w/SPC w/min A Goal status: REVISED   5.  Pt will verbalize understanding of local PD community resources, including fitness post DC.   Baseline:  Goal status: INITIAL   ASSESSMENT:  CLINICAL IMPRESSION:  Emphasis of skilled PT session on gait training w/emphasis on improved freezing strategies, stepping strategies and step length/clearance. Pt ambulated well on TM and will continue to benefit from speed drills, as this is a freezing trigger for him. Pt able to carry a full cup of water around the gym this date w/no freezing noted. Pt states his freezing is better  if he slows down. Pt most challenged by set-switch from retro to fwd gait but did improve w/cues to fully regain balance prior to shifting.  Continue POC.     OBJECTIVE IMPAIRMENTS: Abnormal gait, decreased balance, decreased cognition, decreased knowledge of condition, decreased knowledge of use of DME, decreased mobility, difficulty walking, decreased strength, decreased safety awareness, impaired perceived functional ability, improper body mechanics, and pain  ACTIVITY LIMITATIONS: carrying, lifting, bending, standing, squatting, stairs, transfers, reach over head, hygiene/grooming, and locomotion level  PARTICIPATION LIMITATIONS: meal prep, cleaning, laundry, medication management, interpersonal relationship, driving, shopping, community activity, occupation, and yard work  PERSONAL FACTORS: Age, Behavior pattern, Fitness, Past/current experiences, Transportation, and 1 comorbidity: PD  are also affecting patient's functional outcome.   REHAB POTENTIAL: Fair due to poor compliance w/therapy recommendations  CLINICAL DECISION MAKING: Evolving/moderate complexity  EVALUATION COMPLEXITY: Moderate  PLAN:  PT FREQUENCY: 2x/week  PT DURATION: 8 weeks (POC written for 10 weeks due to delay in scheduling)   PLANNED INTERVENTIONS: 29528- PT Re-evaluation, 97110-Therapeutic exercises, 97530- Therapeutic activity, 97112- Neuromuscular re-education, 97535- Self Care, 41324- Manual therapy, 40102- Gait training, Balance training, Stair training, and DME instructions  PLAN FOR NEXT SESSION: 10th visit PN. Work on low back mobility. Continue to work on gait on TM. Add to HEP for improved transfers, posture. Freezing techniques. Pt wants to try TM. Reciprocal coordination, lateral weight shifting, turns   Jill Alexanders Jerred Zaremba, PT, DPT 08/29/2023, 2:04 PM

## 2023-09-02 ENCOUNTER — Encounter: Payer: Self-pay | Admitting: Occupational Therapy

## 2023-09-02 ENCOUNTER — Ambulatory Visit: Payer: Medicare Other | Admitting: Occupational Therapy

## 2023-09-02 ENCOUNTER — Ambulatory Visit: Payer: Medicare Other | Admitting: Physical Therapy

## 2023-09-02 DIAGNOSIS — R471 Dysarthria and anarthria: Secondary | ICD-10-CM | POA: Diagnosis not present

## 2023-09-02 DIAGNOSIS — M6281 Muscle weakness (generalized): Secondary | ICD-10-CM | POA: Diagnosis not present

## 2023-09-02 DIAGNOSIS — R2681 Unsteadiness on feet: Secondary | ICD-10-CM | POA: Diagnosis not present

## 2023-09-02 DIAGNOSIS — R2689 Other abnormalities of gait and mobility: Secondary | ICD-10-CM | POA: Diagnosis not present

## 2023-09-02 DIAGNOSIS — R278 Other lack of coordination: Secondary | ICD-10-CM

## 2023-09-02 DIAGNOSIS — R29818 Other symptoms and signs involving the nervous system: Secondary | ICD-10-CM | POA: Diagnosis not present
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
## 2023-09-02 NOTE — Patient Instructions (Addendum)
PWR! Hand Exercises Perform each exercise at least 10 repetitions 1x/day, and PWR! PUSH throughout the day when you are having trouble using your hands (picking up small objects, writing, eating, typing, buttoning, etc.). ** Make each movement big and deliberate; feel the movement.  PWR! UP: Fists to open fingers BIG  PWR! Rock:  Move wrists up and down [employer]! Twist: Twist palms up and down BIG  PWR! Step: Step your thumb to index finger while keeping other fingers straight. Flick fingers out BIG (thumb out/straighten fingers). Repeat with other fingers.  PWR! PUSH: Push hands out BIG. Elbows straight, wrists up, fingers open and spread apart BIG.              Suggestions for Handwriting Changes  Many people with Parkinson's notice changes in their handwriting.  Handwriting often becomes small and cramped, and can become more difficult to control when writing for longer periods of time.  This handwriting change is called micrographia.  Why does micrographia occur?  Parkinson's can cause slowing of movement, and feelings of muscle stiffness in the hands and fingers.  Loss of automatic motion also affects the easy, flowing motion of handwriting.  This can impact even simple writing tasks such as signing your name or writing a shopping list.  Attempts to write quickly without thinking about forming each letter contributes to small, cramped handwriting, and may cause the hand to develop a feeling of tightness.  How can I make writing easier?  Make a deliberate effort to form each letter.  This can be hard to do at first, but is very effective in improving size and legibility of handwriting. Slide forearm along paper   Use a pen grip (round or triangular shaped rubber or foam cylinders available at stationary stores or where writing materials are found) or a larger size pen to keep your hand more relaxed.  Try printing rather than writing in a cursive style. Printing causes you to  pause briefly between each letter, keeping writing more legible.   Using lined paper may provide a "visual target" to keep all letters big when writing.   A ballpoint pen typically works better than felt tip or "rolling writer"/gel styles.  Rest your hand if it begins to feel "tight".  Pause briefly when you see your handwriting becoming smaller.  Avoid hurrying or trying to write long passages if you are feeling stressed or fatigued.  Practice helps!  Remind yourself to slow down, aim big, and pause often!  Perform "flicks"/PWR! Hands if your hand feels tight, your writing gets smaller, before you start writing, or if tremors increase.  Involving your team: An occupational therapist can provide assessment and individual recommendations for improvement of your handwriting.  This handout was adapted from [email or web address] [employer]

## 2023-09-02 NOTE — Therapy (Signed)
OUTPATIENT PHYSICAL THERAPY NEURO TREATMENT/10TH VISIT PN   Patient [name] MRN: [id number] DOB:1946/03/17, 77 y.o., male Today's Date: 09/02/2023   PCP: Emilio Aspen, MD REFERRING PROVIDER: Vladimir Faster, DO  10th Visit Physical Therapy Progress Note  Dates of Reporting Period: 07/30/23 to 09/02/23    END OF SESSION:  PT End of Session - 09/02/23 1321     Visit Number 10    Number of Visits 17   Plus eval   Date for PT Re-Evaluation 10/08/23    Authorization Type [employer] Medicare    PT Start Time 1318    PT Stop Time 1358    PT Time Calculation (min) 40 min    Equipment Utilized During Treatment Gait belt    Activity Tolerance Patient tolerated treatment well    Behavior During Therapy WFL for tasks assessed/performed               Past Medical History:  Diagnosis Date   Abnormal involuntary movement 02/18/2018   Allergic rhinitis    Arthritis    BPH (benign prostatic hyperplasia)    Diabetes (HCC)    Dysphagia, pharyngeal phase    GERD diagnosed on barium swallow. Has small hiatal hernia. Symptomatically somewhat better on omeprazole but not entirely. We'll try b.i.d. therapy   Edema    1+ in both ankles, likely multifactorial including medication such as Requip   Erectile dysfunction    Staxyn 10 mg or Viagra worked well. 3 samples of Cialis 20 mg provided   GERD (gastroesophageal reflux disease)    Hypercholesteremia    Hypertension    Nephrolithiasis    Onychomycosis of toenail    April 27, 2013 - Dr. Merwyn Katos - podiatry, was in [place] - treating with oral Lamisil and topical nail therapy   Parkinson's disease [facility])     followed by Dr. Raquel Sarna at [facility] and Lesia Sago, M.D. in [facility]   Presbycusis    and tinnitus - Dr. Serena Colonel - August/2013   Renal calculus    Syncope    Past Surgical History:  Procedure Laterality Date   CORONARY STENT INTERVENTION N/A 01/11/2023   Procedure: CORONARY STENT INTERVENTION;  Surgeon:  Kathleene Hazel, MD;  Location: [facility];  Service: Cardiovascular;  Laterality: N/A;   HAND SURGERY     INGUINAL HERNIA REPAIR Left 11/04/2015   Procedure: LEFT INGUINAL HERNIA REPAIR WITH MESH;  Surgeon: Darnell Level, MD;  Location: [facility];  Service: General;  Laterality: Left;   INSERTION OF MESH Left 11/04/2015   Procedure: INSERTION OF MESH;  Surgeon: Darnell Level, MD;  Location: [facility];  Service: General;  Laterality: Left;   JOINT REPLACEMENT Bilateral    KNEE SURGERY     LEFT HEART CATH AND CORONARY ANGIOGRAPHY N/A 01/11/2023   Procedure: LEFT HEART CATH AND CORONARY ANGIOGRAPHY;  Surgeon: Kathleene Hazel, MD;  Location: [facility];  Service: Cardiovascular;  Laterality: N/A;   TOTAL KNEE ARTHROPLASTY     Patient Active Problem List   Diagnosis Date Noted   Gross hematuria 07/03/2023   Depression 06/24/2023   Chemosis 06/24/2023   Elevated troponin 04/03/2023   Sepsis (HCC) 04/01/2023   Coronary artery disease involving native coronary 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 angina pectoris 01/11/2023   Chronic combined systolic and diastolic heart failure (HCC)    Dysphagia 04/29/2022   AKI (acute kidney injury) (HCC)    Dehydration  Failure to thrive (child)    Bacteriuria, asymptomatic    Failure to thrive in adult 04/20/2022   Malaise, possible ESBL Klebsiella UTI 04/20/2022   Chronic retention of urine 04/20/2022   Closed lumbar vertebral fracture (HCC) 04/20/2022   UTI (urinary tract infection) 03/01/2022   Leukocytosis 01/01/2022   Hyponatremia 01/01/2022   CKD (chronic kidney disease), stage IIIa (HCC) 01/01/2022   HTN (hypertension) 12/31/2021   History of ESBL Klebsiella pneumoniae infection 12/31/2021   Generalized weakness 12/31/2021   Chronic venous insufficiency 08/29/2021   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   Encounter for general adult medical examination with abnormal findings  02/18/2018   Enlarged prostate 02/18/2018   Hearing loss 02/18/2018   Other long term (current) drug therapy 02/18/2018   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) 02/18/2018   HLD (hyperlipidemia)    Nephrolithiasis    Parkinson disease (HCC)    Diabetes mellitus with coincident hypertension (HCC)    Syncope    Renal calculus    Diabetes (HCC)    Allergic rhinitis    BPH (benign prostatic hyperplasia)    Erectile dysfunction    Dysphagia, pharyngeal phase    Edema    Ureteral calculus 02/09/2013   LEG PAIN 03/14/2010    ONSET DATE: 07/15/2023 (referral)   REFERRING DIAG: G20.B2 (ICD-10-CM) - Parkinson's disease with dyskinesia and fluctuating manifestations (HCC) R26.89 (ICD-10-CM) - Balance problem W19.XXXD (ICD-10-CM) - Fall, subsequent encounter  THERAPY DIAG:  Other abnormalities of gait and mobility  Unsteadiness on feet  Muscle weakness (generalized)  Rationale for Evaluation and Treatment: Rehabilitation  SUBJECTIVE:                                                                                                                                                                                             SUBJECTIVE STATEMENT: No falls. Only big issue is that his back has been pretty stiff. Does some stretches for his low back.   Pt accompanied by:  Self  PERTINENT HISTORY: HTN, chronic bilateral lower extremity edema, GERD, HLD, BPH, parkinson's disease  PAIN:  Are you having pain? No  PRECAUTIONS: Fall  RED FLAGS: None   WEIGHT BEARING RESTRICTIONS: No  FALLS: Has patient fallen in last 6 months? Yes. Number of falls Multiple- unsure the number   LIVING ENVIRONMENT: Lives with: lives with their spouse Lives in: House/apartment - pt reports he lives in the basement due to a fall down the steps last year that resulted in spinal fx  Stairs: Yes: Internal: full flight steps; on right going up Has following equipment at home: Single point cane,  Walker - 2 wheeled, Environmental consultant - 4 wheeled, shower chair, and Grab bars  PLOF: Requires assistive device for independence, Needs assistance with ADLs, and Needs assistance with homemaking  PATIENT GOALS: "to improve balance and strength"   OBJECTIVE:  Note: Objective measures were completed at Evaluation unless otherwise noted.  DIAGNOSTIC FINDINGS: MRI of brain on 07/05/23   IMPRESSION: 1. No acute intracranial abnormality. 2. Mild for age signal changes in the cerebral white matter and a solitary chronic microhemorrhage in the right midbrain most compatible with chronic small vessel disease.  CT of chest on 07/03/23  IMPRESSION: 1. Minimally displaced left sixth rib fracture, without pneumothorax or effusion. 2. Healing left seventh and eighth rib fractures. 3. Cholelithiasis. 4. Left nephrolithiasis without hydronephrosis. 5. Chronic L1 compression fracture deformity. 6. Coronary and aortic Atherosclerosis (ICD10-I70.0).  COGNITION: Overall cognitive status: Impaired and extremely poor insight into deficits    SENSATION: Pt denies numbness/tingling    EDEMA: Chronic BLE edema    POSTURE: rounded shoulders, forward head, increased thoracic kyphosis, and weight shift right  LOWER EXTREMITY MMT:    MMT Right Eval Left Eval  Hip flexion    Hip extension    Hip abduction    Hip adduction    Hip internal rotation    Hip external rotation    Knee flexion    Knee extension    Ankle dorsiflexion    Ankle plantarflexion    Ankle inversion    Ankle eversion    (Blank rows = not tested)  BED MOBILITY:  Independent per pt   TRANSFERS: Assistive device utilized: Single point cane  Sit to stand: CGA Stand to sit: CGA Pt required min A and mod verbal cues to fully turn and back up prior to sitting in chair, as he almost missed chair when walking into evaluation room. Heavy reliance on BUE support w/poor anterior weight shift. Unable to obtain full hip/knee extension in  stance  VITALS  There were no vitals filed for this visit.   TODAY'S TREATMENT:    Ther Ex  SciFit multi-peaks level 8.0 for 5 minutes using BUE/BLEs for neural priming for reciprocal movement, dynamic cardiovascular warmup and increased amplitude of stepping and for warm-up before low back stretches. When walking over to Scifit, pt did have an episode of festinating gait, with cued to stop and reset before continuing to walk and take bigger steps   Lower trunk rotations x10 reps for lower back mobility, cues to try to keep hips on the mat  Single knee to chest stretch 2 x 30 seconds each side, feels more of a stretch in his hamstring  Prone press ups 10 reps, pt reporting feeling the most stretch with this, needed cues to keep hips on the mat table  Attempted child's pose, pt with significant hypomobility, pt reporting feeling this more in his knees than his back  Hamstring stretch 2 x 30 seconds bilat, pt with incr tightness in this area  A/P pelvic tilts 15 reps with verbal/tactile cues for proper technique   GAIT: Gait pattern: step through pattern, decreased arm swing- Right, decreased arm swing- Left, decreased stride length, decreased hip/knee flexion- Right, decreased hip/knee flexion- Left, decreased ankle dorsiflexion- Right, decreased ankle dorsiflexion- Left, shuffling, lateral hip instability, decreased trunk rotation, trunk flexed, and poor foot clearance- Right Distance walked: Various clinic distances  Assistive device utilized: Single point cane Level of assistance: SBA Comments: Pt demonstrating improvement in freezing during session today. No cues required for increased stride  length or to slow down.   PATIENT EDUCATION: Education details: Continue HEP, provided handout on local community resources and went over with pt, added exercises for low back mobility  Person educated: Patient Education method: Programmer, multimedia, Demonstration, Verbal cues, and Handouts Education  comprehension: returned demonstration, verbal cues required, and needs further education  HOME EXERCISE PROGRAM: Standing PWR Moves   Access Code: [email or web address] URL: [email or web address] Date: 09/02/2023 Prepared by: Sherlie Ban  Exercises - Standing Quarter Turn with Counter Support  - 1 x daily - 7 x weekly - 3 sets - 10 reps - Seated Hamstring Stretch  - 1 x daily - 3-4 x weekly - 3 sets - 30 hold - Prone Press Up  - 1 x daily - 3-4 x weekly - 1-2 sets - 10 reps - Supine Lower Trunk Rotation  - 1 x daily - 3-4 x weekly - 1-2 sets - 10 reps - Supine Pelvic Tilt  - 1 x daily - 3-4 x weekly - 1-2 sets - 10 reps  Access Code: [email or web address] URL: [email or web address] Date: 08/15/2023 Prepared by: Alethia Berthold Plaster  Exercises - Sit to Stand Without Arm Support  - 1 x daily - 7 x weekly - 3 sets - 10 reps    GOALS: Goals reviewed with patient? Yes  SHORT TERM GOALS: Target date: 08/27/2023   Pt will perform initial HEP w/CGA from caregiver for improved strength, balance, transfers and gait.  Baseline: pt reports performing HEP on his own at home  Goal status: MET   2.  Pt will improve 5 x STS to less than or equal to 15 seconds w/proper body mechanics to demonstrate improved functional strength and transfer efficiency.   Baseline: 13.41s w/BUE support and improper body mechanics   15.8 seconds with no UE support, with proper body mechanics  Goal status: PARTIALLY MET  3.  Pt will be compliant w/use of RW at all times for reduced fall frequency and independence  Baseline: Pt not using   Pt using SPC, not safe with RW  Goal status: N/A  4.  Pt and wife will verbalize understanding of freezing strategies to implement at home and in community for reduced fall frequency.   Baseline:  Goal status: MET  5.  Pt will improve normal TUG to less than or equal to 15 seconds w/safest AD and CGA for improved functional mobility and decreased fall  risk.  Baseline: 15.68s w/SPC w/min A  15.6 seconds with SPC, SBA/CGA no freezing episodes  Goal status: PARTIALLY MET    LONG TERM GOALS: Target date: 09/24/2023   Pt will perform final HEP w/SBA from caregiver for improved strength, balance, transfers and gait.  Baseline:  Goal status: INITIAL  2.  Pt will improve gait velocity to at least 2.9 ft/s w/LRAD mod I for improved gait efficiency and independence  Baseline: 2.7 ft/s w/SPC and CGA-min A Goal status: INITIAL  3.  Pt will improve 5 x STS to less than or equal to 13 seconds w/proper body mechanics to demonstrate improved functional strength and transfer efficiency.   Baseline: 13.41s w/BUE support and improper body mechanics  Goal status: INITIAL  4.  Pt will improve normal TUG to less than or equal to 13 seconds w/safest AD and SBA for improved functional mobility and decreased fall risk.  Baseline: 15.68s w/SPC w/min A Goal status: REVISED   5.  Pt will verbalize understanding of local PD community resources, including fitness post DC.   Baseline: provided handout on 11/18 Goal  status: INITIAL   ASSESSMENT:  CLINICAL IMPRESSION:  10th visit PN:  STGs assessed on 08/26/23. Pt has met goals in regards to HEP and being able to verbalize different freezing strategies. Pt also subjectively reports that freezing episodes have been better since he started therapy. Pt partially met goals in regards to 5x sit <> stand and TUG. Pt's times remained similar compared to eval, but pt with much better quality of movement and needing less assist. Pt able to perform TUG today without freezing (at eval had significant freezing). And pt able to demo improved anterior weight shift for sit <> stands with no UE support (previously needed BUE support). Today's skilled session focused on working on low back mobility and core exercises due to pt with tightness and stiffness. Provided pt with updated exercises to HEP. Continue POC.      OBJECTIVE IMPAIRMENTS: Abnormal gait, decreased balance, decreased cognition, decreased knowledge of condition, decreased knowledge of use of DME, decreased mobility, difficulty walking, decreased strength, decreased safety awareness, impaired perceived functional ability, improper body mechanics, and pain  ACTIVITY LIMITATIONS: carrying, lifting, bending, standing, squatting, stairs, transfers, reach over head, hygiene/grooming, and locomotion level  PARTICIPATION LIMITATIONS: meal prep, cleaning, laundry, medication management, interpersonal relationship, driving, shopping, community activity, occupation, and yard work  PERSONAL FACTORS: Age, Behavior pattern, Fitness, Past/current experiences, Transportation, and 1 comorbidity: PD  are also affecting patient's functional outcome.   REHAB POTENTIAL: Fair due to poor compliance w/therapy recommendations  CLINICAL DECISION MAKING: Evolving/moderate complexity  EVALUATION COMPLEXITY: Moderate  PLAN:  PT FREQUENCY: 2x/week  PT DURATION: 8 weeks (POC written for 10 weeks due to delay in scheduling)   PLANNED INTERVENTIONS: 16109- PT Re-evaluation, 97110-Therapeutic exercises, 97530- Therapeutic activity, 97112- Neuromuscular re-education, 97535- Self Care, [id number]- Manual therapy, [phone]- Gait training, Balance training, Stair training, and DME instructions  PLAN FOR NEXT SESSION: Continue to work on low back mobility/core exercises. Continue to work on gait on TM. Add to HEP for improved transfers, posture. Freezing techniques. Pt wants to try TM. Reciprocal coordination, lateral weight shifting, turns   Drake Leach, PT, DPT 09/02/2023, 2:56 PM

## 2023-09-04 ENCOUNTER — Other Ambulatory Visit: Payer: Self-pay | Admitting: Neurology

## 2023-09-04 DIAGNOSIS — G20B2 Parkinson's disease with dyskinesia, with fluctuations: Secondary | ICD-10-CM

## 2023-09-05 ENCOUNTER — Ambulatory Visit: Payer: Medicare Other | Admitting: Physical Therapy

## 2023-09-05 DIAGNOSIS — M6281 Muscle weakness (generalized): Secondary | ICD-10-CM

## 2023-09-05 DIAGNOSIS — R29818 Other symptoms and signs involving the nervous system: Secondary | ICD-10-CM | POA: Diagnosis not present

## 2023-09-05 DIAGNOSIS — R2689 Other abnormalities of gait and mobility: Secondary | ICD-10-CM | POA: Diagnosis not present

## 2023-09-05 DIAGNOSIS — R278 Other lack of coordination: Secondary | ICD-10-CM | POA: Diagnosis not present

## 2023-09-05 DIAGNOSIS — R471 Dysarthria and anarthria: Secondary | ICD-10-CM | POA: Diagnosis not present

## 2023-09-05 DIAGNOSIS — R2681 Unsteadiness on feet: Secondary | ICD-10-CM | POA: Diagnosis not present

## 2023-09-05 NOTE — Therapy (Signed)
OUTPATIENT PHYSICAL THERAPY NEURO TREATMENT   Patient [name] MRN: [id number] DOB:[birth year], 77 y.o., male Today's Date: 09/05/2023   PCP: Emilio Aspen, MD REFERRING PROVIDER: Vladimir Faster, DO    END OF SESSION:  PT End of Session - 09/05/23 1319     Visit Number 11    Number of Visits 17   Plus eval   Date for PT Re-Evaluation 10/08/23    Authorization Type [employer] Medicare    PT Start Time 1318    PT Stop Time 1357    PT Time Calculation (min) 39 min    Equipment Utilized During Treatment Gait belt    Activity Tolerance Patient tolerated treatment well    Behavior During Therapy WFL for tasks assessed/performed               Past Medical History:  Diagnosis Date   Abnormal involuntary movement 02/18/2018   Allergic rhinitis    Arthritis    BPH (benign prostatic hyperplasia)    Diabetes (HCC)    Dysphagia, pharyngeal phase    GERD diagnosed on barium swallow. Has small hiatal hernia. Symptomatically somewhat better on omeprazole but not entirely. We'll try b.i.d. therapy   Edema    1+ in both ankles, likely multifactorial including medication such as Requip   Erectile dysfunction    Staxyn 10 mg or Viagra worked well. 3 samples of Cialis 20 mg provided   GERD (gastroesophageal reflux disease)    Hypercholesteremia    Hypertension    Nephrolithiasis    Onychomycosis of toenail    April 27, 2013 - Dr. Merwyn Katos - podiatry, was in [place] - treating with oral Lamisil and topical nail therapy   Parkinson's disease [facility])     followed by Dr. Raquel Sarna at [facility] and Lesia Sago, M.D. in [place]   Presbycusis    and tinnitus - Dr. Serena Colonel - August/2013   Renal calculus    Syncope    Past Surgical History:  Procedure Laterality Date   CORONARY STENT INTERVENTION N/A 01/11/2023   Procedure: CORONARY STENT INTERVENTION;  Surgeon: Kathleene Hazel, MD;  Location: [facility];  Service: Cardiovascular;  Laterality: N/A;    HAND SURGERY     INGUINAL HERNIA REPAIR Left 11/04/2015   Procedure: LEFT INGUINAL HERNIA REPAIR WITH MESH;  Surgeon: Darnell Level, MD;  Location: [facility];  Service: General;  Laterality: Left;   INSERTION OF MESH Left 11/04/2015   Procedure: INSERTION OF MESH;  Surgeon: Darnell Level, MD;  Location: [facility];  Service: General;  Laterality: Left;   JOINT REPLACEMENT Bilateral    KNEE SURGERY     LEFT HEART CATH AND CORONARY ANGIOGRAPHY N/A 01/11/2023   Procedure: LEFT HEART CATH AND CORONARY ANGIOGRAPHY;  Surgeon: Kathleene Hazel, MD;  Location: [facility];  Service: Cardiovascular;  Laterality: N/A;   TOTAL KNEE ARTHROPLASTY     Patient Active Problem List   Diagnosis Date Noted   Gross hematuria 07/03/2023   Depression 06/24/2023   Chemosis 06/24/2023   Elevated troponin 04/03/2023   Sepsis (HCC) 04/01/2023   Coronary artery disease involving native coronary 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 angina pectoris 01/11/2023   Chronic combined systolic and diastolic heart failure (HCC)    Dysphagia 04/29/2022   AKI (acute kidney injury) (HCC)    Dehydration    Failure to thrive (child)    Bacteriuria, asymptomatic    Failure to thrive in adult  04/20/2022   Malaise, possible ESBL Klebsiella UTI 04/20/2022   Chronic retention of urine 04/20/2022   Closed lumbar vertebral fracture (HCC) 04/20/2022   UTI (urinary tract infection) 03/01/2022   Leukocytosis 01/01/2022   Hyponatremia 01/01/2022   CKD (chronic kidney disease), stage IIIa (HCC) 01/01/2022   HTN (hypertension) 12/31/2021   History of ESBL Klebsiella pneumoniae infection 12/31/2021   Generalized weakness 12/31/2021   Chronic venous insufficiency 08/29/2021   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   Encounter for general adult medical examination with abnormal findings 02/18/2018   Enlarged prostate 02/18/2018   Hearing loss 02/18/2018   Other long term (current) drug  therapy 02/18/2018   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) 02/18/2018   HLD (hyperlipidemia)    Nephrolithiasis    Parkinson disease (HCC)    Diabetes mellitus with coincident hypertension (HCC)    Syncope    Renal calculus    Diabetes (HCC)    Allergic rhinitis    BPH (benign prostatic hyperplasia)    Erectile dysfunction    Dysphagia, pharyngeal phase    Edema    Ureteral calculus 02/09/2013   LEG PAIN 03/14/2010    ONSET DATE: 07/15/2023 (referral)   REFERRING DIAG: G20.B2 (ICD-10-CM) - Parkinson's disease with dyskinesia and fluctuating manifestations (HCC) R26.89 (ICD-10-CM) - Balance problem W19.XXXD (ICD-10-CM) - Fall, subsequent encounter  THERAPY DIAG:  Unsteadiness on feet  Other abnormalities of gait and mobility  Muscle weakness (generalized)  Other lack of coordination  Rationale for Evaluation and Treatment: Rehabilitation  SUBJECTIVE:                                                                                                                                                                                             SUBJECTIVE STATEMENT: No falls. States his back is still stiff. Thinks therapy has been very helpful this time around.   Pt accompanied by:  Self  PERTINENT HISTORY: HTN, chronic bilateral lower extremity edema, GERD, HLD, BPH, parkinson's disease  PAIN:  Are you having pain? No  PRECAUTIONS: Fall  RED FLAGS: None   WEIGHT BEARING RESTRICTIONS: No  FALLS: Has patient fallen in last 6 months? Yes. Number of falls Multiple- unsure the number   LIVING ENVIRONMENT: Lives with: lives with their spouse Lives in: House/apartment - pt reports he lives in the basement due to a fall down the steps last year that resulted in spinal fx  Stairs: Yes: Internal: full flight steps; on right going up Has following equipment at home: Single point cane, Walker - 2 wheeled, Environmental consultant - 4 wheeled, shower chair, and Grab bars  PLOF:  Requires assistive device for independence, Needs assistance with ADLs, and Needs assistance with homemaking  PATIENT GOALS: "to improve balance and strength"   OBJECTIVE:  Note: Objective measures were completed at Evaluation unless otherwise noted.  DIAGNOSTIC FINDINGS: MRI of brain on 07/05/23   IMPRESSION: 1. No acute intracranial abnormality. 2. Mild for age signal changes in the cerebral white matter and a solitary chronic microhemorrhage in the right midbrain most compatible with chronic small vessel disease.  CT of chest on 07/03/23  IMPRESSION: 1. Minimally displaced left sixth rib fracture, without pneumothorax or effusion. 2. Healing left seventh and eighth rib fractures. 3. Cholelithiasis. 4. Left nephrolithiasis without hydronephrosis. 5. Chronic L1 compression fracture deformity. 6. Coronary and aortic Atherosclerosis (ICD10-I70.0).  COGNITION: Overall cognitive status: Impaired and extremely poor insight into deficits    SENSATION: Pt denies numbness/tingling    EDEMA: Chronic BLE edema    POSTURE: rounded shoulders, forward head, increased thoracic kyphosis, and weight shift right  LOWER EXTREMITY MMT:    MMT Right Eval Left Eval  Hip flexion    Hip extension    Hip abduction    Hip adduction    Hip internal rotation    Hip external rotation    Knee flexion    Knee extension    Ankle dorsiflexion    Ankle plantarflexion    Ankle inversion    Ankle eversion    (Blank rows = not tested)  BED MOBILITY:  Independent per pt   TRANSFERS: Assistive device utilized: Single point cane  Sit to stand: CGA Stand to sit: CGA Pt required min A and mod verbal cues to fully turn and back up prior to sitting in chair, as he almost missed chair when walking into evaluation room. Heavy reliance on BUE support w/poor anterior weight shift. Unable to obtain full hip/knee extension in stance  VITALS  There were no vitals filed for this visit.   TODAY'S  TREATMENT:    Ther Ex  SciFit multi-peaks level 12 for 8 minutes using BUE/BLEs for neural priming for reciprocal movement, dynamic cardiovascular warmup and increased amplitude of stepping. Minor freezing noted as pt approached Scifit but pt able to stop and reset without cues.  On mat table, supine iron crosses, x4 per side, for improved spinal mobility. Decreased mobility noted when rotating to R side. Added to HEP (see bolded below) as pt enjoyed this exercise.   NMR Alt fwd step w/ rotation w/2kg ball throw/catch at rebounder, x10 per LE per direction, for improved step length/clearance, truncal mobility and single leg stability. Increased difficulty rotating to R side, requiring CGA. Min cues required for increased step length/clearance of RLE throughout. Pt enjoyed exercise but reported soreness in low back following activity.  On large blue theraball, alt marches w/2s isometric hold, x8 per side, for improved lateral weight shift and core stability. Pt required min A-mod A for stabilization on ball. Progressed to adding reciprocal OH reach w/march to work on reciprocal coordination and pt required min verbal cues to maintain, as he frequently performed ipsilateral arm raises rather than contralateral.    GAIT: Gait pattern: step through pattern, decreased arm swing- Right, decreased arm swing- Left, decreased stride length, decreased hip/knee flexion- Right, decreased hip/knee flexion- Left, decreased ankle dorsiflexion- Right, decreased ankle dorsiflexion- Left, shuffling, lateral hip instability, decreased trunk rotation, trunk flexed, and poor foot clearance- Right Distance walked: Various clinic distances  Assistive device utilized: Single point cane Level of assistance: SBA Comments: Pt demonstrating improvement in freezing  during session today. No cues required for increased stride length or to slow down.   PATIENT EDUCATION: Education details: Continue HEP, plan to reduce frequency  to 1x/week as pt is doing well and am encouraging him to do more exercise at home Person educated: Patient Education method: Explanation Education comprehension: verbalized understanding  HOME EXERCISE PROGRAM: Standing PWR Moves   Access Code: [email or web address] URL: [email or web address] Date: 09/02/2023 Prepared by: Sherlie Ban  Exercises - Standing Quarter Turn with Counter Support  - 1 x daily - 7 x weekly - 3 sets - 10 reps - Seated Hamstring Stretch  - 1 x daily - 3-4 x weekly - 3 sets - 30 hold - Prone Press Up  - 1 x daily - 3-4 x weekly - 1-2 sets - 10 reps - Supine Lower Trunk Rotation  - 1 x daily - 3-4 x weekly - 1-2 sets - 10 reps - Supine Pelvic Tilt  - 1 x daily - 3-4 x weekly - 1-2 sets - 10 reps - Supine Straight Leg Lumbar Rotation Stretch  - 1 x daily - 7 x weekly - 3 sets - 5-10 reps - 10-15 second hold  Access Code: [email or web address] URL: [email or web address] Date: 08/15/2023 Prepared by: Alethia Berthold Anahlia Iseminger  Exercises - Sit to Stand Without Arm Support  - 1 x daily - 7 x weekly - 3 sets - 10 reps    GOALS: Goals reviewed with patient? Yes  SHORT TERM GOALS: Target date: 08/27/2023   Pt will perform initial HEP w/CGA from caregiver for improved strength, balance, transfers and gait.  Baseline: pt reports performing HEP on his own at home  Goal status: MET   2.  Pt will improve 5 x STS to less than or equal to 15 seconds w/proper body mechanics to demonstrate improved functional strength and transfer efficiency.   Baseline: 13.41s w/BUE support and improper body mechanics   15.8 seconds with no UE support, with proper body mechanics  Goal status: PARTIALLY MET  3.  Pt will be compliant w/use of RW at all times for reduced fall frequency and independence  Baseline: Pt not using   Pt using SPC, not safe with RW  Goal status: N/A  4.  Pt and wife will verbalize understanding of freezing strategies to implement at home and in community  for reduced fall frequency.   Baseline:  Goal status: MET  5.  Pt will improve normal TUG to less than or equal to 15 seconds w/safest AD and CGA for improved functional mobility and decreased fall risk.  Baseline: 15.68s w/SPC w/min A  15.6 seconds with SPC, SBA/CGA no freezing episodes  Goal status: PARTIALLY MET    LONG TERM GOALS: Target date: 09/24/2023   Pt will perform final HEP w/SBA from caregiver for improved strength, balance, transfers and gait.  Baseline:  Goal status: INITIAL  2.  Pt will improve gait velocity to at least 2.9 ft/s w/LRAD mod I for improved gait efficiency and independence  Baseline: 2.7 ft/s w/SPC and CGA-min A Goal status: INITIAL  3.  Pt will improve 5 x STS to less than or equal to 13 seconds w/proper body mechanics to demonstrate improved functional strength and transfer efficiency.   Baseline: 13.41s w/BUE support and improper body mechanics  Goal status: INITIAL  4.  Pt will improve normal TUG to less than or equal to 13 seconds w/safest AD and SBA for improved functional mobility and decreased fall risk.  Baseline: 15.68s w/SPC w/min A Goal status:  REVISED   5.  Pt will verbalize understanding of local PD community resources, including fitness post DC.   Baseline: provided handout on 11/18 Goal status: INITIAL   ASSESSMENT:  CLINICAL IMPRESSION:  Emphasis of skilled PT session on discussing update in POC, reciprocal coordination, core stability and truncal mobility. Pt continues to report tightness in low back but did improve w/iron crosses, so added to HEP. Pt more challenged w/rotation to R side and step length w/RLE, requiring cues for large movement. Pt in agreement to reduce PT frequency to 1x/week to work on exercises at home and integrate into community resources. Continue POC.     OBJECTIVE IMPAIRMENTS: Abnormal gait, decreased balance, decreased cognition, decreased knowledge of condition, decreased knowledge of use of  DME, decreased mobility, difficulty walking, decreased strength, decreased safety awareness, impaired perceived functional ability, improper body mechanics, and pain  ACTIVITY LIMITATIONS: carrying, lifting, bending, standing, squatting, stairs, transfers, reach over head, hygiene/grooming, and locomotion level  PARTICIPATION LIMITATIONS: meal prep, cleaning, laundry, medication management, interpersonal relationship, driving, shopping, community activity, occupation, and yard work  PERSONAL FACTORS: Age, Behavior pattern, Fitness, Past/current experiences, Transportation, and 1 comorbidity: PD  are also affecting patient's functional outcome.   REHAB POTENTIAL: Fair due to poor compliance w/therapy recommendations  CLINICAL DECISION MAKING: Evolving/moderate complexity  EVALUATION COMPLEXITY: Moderate  PLAN:  PT FREQUENCY: 2x/week  PT DURATION: 8 weeks (POC written for 10 weeks due to delay in scheduling)   PLANNED INTERVENTIONS: 16109- PT Re-evaluation, 97110-Therapeutic exercises, 97530- Therapeutic activity, 97112- Neuromuscular re-education, 97535- Self Care, [id number]- Manual therapy, [phone]- Gait training, Balance training, Stair training, and DME instructions  PLAN FOR NEXT SESSION: Continue to work on low back mobility/core exercises. Continue to work on gait on TM. Add to HEP for improved transfers, posture. Freezing techniques. Pt wants to try TM. Reciprocal coordination, lateral weight shifting, turns, reciprocal coordination   Dimetrius Montfort E Ridhi Hoffert, PT, DPT 09/05/2023, 2:01 PM

## 2023-09-09 ENCOUNTER — Ambulatory Visit: Payer: Medicare Other | Admitting: Physical Therapy

## 2023-09-11 ENCOUNTER — Ambulatory Visit: Payer: Medicare Other | Admitting: Physical Therapy

## 2023-09-16 ENCOUNTER — Ambulatory Visit: Payer: Medicare Other | Attending: Internal Medicine | Admitting: Physical Therapy

## 2023-09-16 ENCOUNTER — Ambulatory Visit: Payer: Medicare Other | Admitting: Occupational Therapy

## 2023-09-16 DIAGNOSIS — M6281 Muscle weakness (generalized): Secondary | ICD-10-CM | POA: Diagnosis not present

## 2023-09-16 DIAGNOSIS — R29818 Other symptoms and signs involving the nervous system: Secondary | ICD-10-CM | POA: Diagnosis not present

## 2023-09-16 DIAGNOSIS — R2689 Other abnormalities of gait and mobility: Secondary | ICD-10-CM | POA: Diagnosis not present

## 2023-09-16 DIAGNOSIS — G20A1 Parkinson's disease without dyskinesia, without mention of fluctuations: Secondary | ICD-10-CM | POA: Diagnosis not present

## 2023-09-16 DIAGNOSIS — R278 Other lack of coordination: Secondary | ICD-10-CM | POA: Diagnosis not present

## 2023-09-16 DIAGNOSIS — R2681 Unsteadiness on feet: Secondary | ICD-10-CM | POA: Diagnosis not present

## 2023-09-16 NOTE — Therapy (Signed)
OUTPATIENT PHYSICAL THERAPY NEURO TREATMENT   Patient [name] MRN: [id number] DOB:03-30-1946, 77 y.o., male Today's Date: 09/16/2023   PCP: Emilio Aspen, MD REFERRING PROVIDER: Vladimir Faster, DO    END OF SESSION:  PT End of Session - 09/16/23 1320     Visit Number 12    Number of Visits 17   Plus eval   Date for PT Re-Evaluation 10/08/23    Authorization Type [employer] Medicare    PT Start Time 1318    PT Stop Time 1358    PT Time Calculation (min) 40 min    Equipment Utilized During Treatment Gait belt    Activity Tolerance Patient tolerated treatment well    Behavior During Therapy WFL for tasks assessed/performed               Past Medical History:  Diagnosis Date   Abnormal involuntary movement 02/18/2018   Allergic rhinitis    Arthritis    BPH (benign prostatic hyperplasia)    Diabetes (HCC)    Dysphagia, pharyngeal phase    GERD diagnosed on barium swallow. Has small hiatal hernia. Symptomatically somewhat better on omeprazole but not entirely. We'll try b.i.d. therapy   Edema    1+ in both ankles, likely multifactorial including medication such as Requip   Erectile dysfunction    Staxyn 10 mg or Viagra worked well. 3 samples of Cialis 20 mg provided   GERD (gastroesophageal reflux disease)    Hypercholesteremia    Hypertension    Nephrolithiasis    Onychomycosis of toenail    April 27, 2013 - Dr. Merwyn Katos - podiatry, was in [place] - treating with oral Lamisil and topical nail therapy   Parkinson's disease [facility])     followed by Dr. Raquel Sarna at [facility] and Lesia Sago, M.D. in [place]   Presbycusis    and tinnitus - Dr. Serena Colonel - August/2013   Renal calculus    Syncope    Past Surgical History:  Procedure Laterality Date   CORONARY STENT INTERVENTION N/A 01/11/2023   Procedure: CORONARY STENT INTERVENTION;  Surgeon: Kathleene Hazel, MD;  Location: [facility];  Service: Cardiovascular;  Laterality: N/A;    HAND SURGERY     INGUINAL HERNIA REPAIR Left 11/04/2015   Procedure: LEFT INGUINAL HERNIA REPAIR WITH MESH;  Surgeon: Darnell Level, MD;  Location: [facility];  Service: General;  Laterality: Left;   INSERTION OF MESH Left 11/04/2015   Procedure: INSERTION OF MESH;  Surgeon: Darnell Level, MD;  Location: [facility];  Service: General;  Laterality: Left;   JOINT REPLACEMENT Bilateral    KNEE SURGERY     LEFT HEART CATH AND CORONARY ANGIOGRAPHY N/A 01/11/2023   Procedure: LEFT HEART CATH AND CORONARY ANGIOGRAPHY;  Surgeon: Kathleene Hazel, MD;  Location: [facility];  Service: Cardiovascular;  Laterality: N/A;   TOTAL KNEE ARTHROPLASTY     Patient Active Problem List   Diagnosis Date Noted   Gross hematuria 07/03/2023   Depression 06/24/2023   Chemosis 06/24/2023   Elevated troponin 04/03/2023   Sepsis (HCC) 04/01/2023   Coronary artery disease involving native coronary 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 angina pectoris 01/11/2023   Chronic combined systolic and diastolic heart failure (HCC)    Dysphagia 04/29/2022   AKI (acute kidney injury) (HCC)    Dehydration    Failure to thrive (child)    Bacteriuria, asymptomatic    Failure to thrive in adult  04/20/2022   Malaise, possible ESBL Klebsiella UTI 04/20/2022   Chronic retention of urine 04/20/2022   Closed lumbar vertebral fracture (HCC) 04/20/2022   UTI (urinary tract infection) 03/01/2022   Leukocytosis 01/01/2022   Hyponatremia 01/01/2022   CKD (chronic kidney disease), stage IIIa (HCC) 01/01/2022   HTN (hypertension) 12/31/2021   History of ESBL Klebsiella pneumoniae infection 12/31/2021   Generalized weakness 12/31/2021   Chronic venous insufficiency 08/29/2021   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   Encounter for general adult medical examination with abnormal findings 02/18/2018   Enlarged prostate 02/18/2018   Hearing loss 02/18/2018   Other long term (current) drug  therapy 02/18/2018   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) 02/18/2018   HLD (hyperlipidemia)    Nephrolithiasis    Parkinson disease (HCC)    Diabetes mellitus with coincident hypertension (HCC)    Syncope    Renal calculus    Diabetes (HCC)    Allergic rhinitis    BPH (benign prostatic hyperplasia)    Erectile dysfunction    Dysphagia, pharyngeal phase    Edema    Ureteral calculus 02/09/2013   LEG PAIN 03/14/2010    ONSET DATE: 07/15/2023 (referral)   REFERRING DIAG: G20.B2 (ICD-10-CM) - Parkinson's disease with dyskinesia and fluctuating manifestations (HCC) R26.89 (ICD-10-CM) - Balance problem W19.XXXD (ICD-10-CM) - Fall, subsequent encounter  THERAPY DIAG:  Unsteadiness on feet  Other abnormalities of gait and mobility  Muscle weakness (generalized)  Other symptoms and signs involving the nervous system  Rationale for Evaluation and Treatment: Rehabilitation  SUBJECTIVE:                                                                                                                                                                                             SUBJECTIVE STATEMENT: No changes, no falls. Notes improvements in his balance. Back is still bothersome. Notes it starts getting bothersome in early afternoon.   Pt accompanied by:  Self  PERTINENT HISTORY: HTN, chronic bilateral lower extremity edema, GERD, HLD, BPH, Parkinson's disease  PAIN:  Are you having pain? No  PRECAUTIONS: Fall  RED FLAGS: None   WEIGHT BEARING RESTRICTIONS: No  FALLS: Has patient fallen in last 6 months? Yes. Number of falls Multiple- unsure the number   LIVING ENVIRONMENT: Lives with: lives with their spouse Lives in: House/apartment - pt reports he lives in the basement due to a fall down the steps last year that resulted in spinal fx  Stairs: Yes: Internal: full flight steps; on right going up Has following equipment at home: Single point cane, Walker - 2  wheeled, Environmental consultant - 4  wheeled, shower chair, and Grab bars  PLOF: Requires assistive device for independence, Needs assistance with ADLs, and Needs assistance with homemaking  PATIENT GOALS: "to improve balance and strength"   OBJECTIVE:  Note: Objective measures were completed at Evaluation unless otherwise noted.  DIAGNOSTIC FINDINGS: MRI of brain on 07/05/23   IMPRESSION: 1. No acute intracranial abnormality. 2. Mild for age signal changes in the cerebral white matter and a solitary chronic microhemorrhage in the right midbrain most compatible with chronic small vessel disease.  CT of chest on 07/03/23  IMPRESSION: 1. Minimally displaced left sixth rib fracture, without pneumothorax or effusion. 2. Healing left seventh and eighth rib fractures. 3. Cholelithiasis. 4. Left nephrolithiasis without hydronephrosis. 5. Chronic L1 compression fracture deformity. 6. Coronary and aortic Atherosclerosis (ICD10-I70.0).  COGNITION: Overall cognitive status: Impaired and extremely poor insight into deficits    SENSATION: Pt denies numbness/tingling    EDEMA: Chronic BLE edema    POSTURE: rounded shoulders, forward head, increased thoracic kyphosis, and weight shift right  LOWER EXTREMITY MMT:    MMT Right Eval Left Eval  Hip flexion    Hip extension    Hip abduction    Hip adduction    Hip internal rotation    Hip external rotation    Knee flexion    Knee extension    Ankle dorsiflexion    Ankle plantarflexion    Ankle inversion    Ankle eversion    (Blank rows = not tested)  BED MOBILITY:  Independent per pt   TRANSFERS: Assistive device utilized: Single point cane  Sit to stand: CGA Stand to sit: CGA Pt required min A and mod verbal cues to fully turn and back up prior to sitting in chair, as he almost missed chair when walking into evaluation room. Heavy reliance on BUE support w/poor anterior weight shift. Unable to obtain full hip/knee extension in  stance  VITALS  There were no vitals filed for this visit.   TODAY'S TREATMENT:     NMR Forward/retro gait with ball toss 3 x 25' each direction with cues for step length and widening BOS, esp in the posterior direction, CGA/min A at times with balance in the backwards direction due to shortened step length, during last 2 reps of each direction, had pt perform cog dual tasking of naming foods in alphabetical order with decr step length noted in the posterior direction.  With use of blaze pods on random hit setting, with pt side stepping on blue foam beam working on reactive balance, foot clearance, SLS, visual scanning, reactive times. Pt needing cues for picking up his feet and step length laterally. Needing intermittent UE support to bars for balance  3 bouts of 1 minute with 30 second rest break in between each: 7 hits, 13 hits, 14 hits  10 reps resisted sit <> stands with blue resisted band around pt's pelvis (PT student providing resistance), with pt standing on blue foam beam, with focus on incr anterior weight shift for nose over toes for sit <> stands as pt normally with decr anterior weight shift, during first rep, pt takes a few attempts to try to come to stand, but improves with incr practice and sometimes gets "stuck" in more forward flexion when attempting to come up to stand. In standing, cued for PWR Up posture with opening up arms and scap retraction  At bottom of staircase, alternating step ups with contralateral march for dynamic SLS 12 reps each side, pt with difficulty performing SLS  and unable to hold for >1-2 seconds and needing intermittent UE support for balance  With 1 lap around therapy gym, practicing changing of directions of gait in forwards/backwards directions and adding in quick start/stops, added cognitive challenge by pt naming animals from A-Z with intermittent cues for naming, pt demonstrating no freezing episodes, but did demo more shuffled steps in the posterior  direction with gait in the retro direction   GAIT: Gait pattern: step through pattern, decreased arm swing- Right, decreased arm swing- Left, decreased stride length, decreased hip/knee flexion- Right, decreased hip/knee flexion- Left, decreased ankle dorsiflexion- Right, decreased ankle dorsiflexion- Left, shuffling, lateral hip instability, decreased trunk rotation, trunk flexed, and poor foot clearance- Right Distance walked: Various clinic distances  Assistive device utilized: Single point cane Level of assistance: SBA Comments: Pt demonstrating improvement in freezing during session today. No cues required for increased stride length or to slow down.   Pt going to sit down on chair/mat table too far away from chair and reaching forward and starts to shuffle/freeze. Pt needs cues to stop and reset, get tall, pick up feet when turning and turning all the way around until he feels BLE against mat table before he reaches to sit back. Pt verbalized understanding, but did need reminder cues.   PATIENT EDUCATION: Education details: Continue HEP, freezing strategies, plan to D/C at next session with plan for PD screens in 6 months  Person educated: Patient Education method: Explanation, Demonstration, and Verbal cues Education comprehension: verbalized understanding, returned demonstration, and needs further education  HOME EXERCISE PROGRAM: Standing PWR Moves   Access Code: [email or web address] URL: [email or web address] Date: 09/02/2023 Prepared by: Sherlie Ban  Exercises - Standing Quarter Turn with Counter Support  - 1 x daily - 7 x weekly - 3 sets - 10 reps - Seated Hamstring Stretch  - 1 x daily - 3-4 x weekly - 3 sets - 30 hold - Prone Press Up  - 1 x daily - 3-4 x weekly - 1-2 sets - 10 reps - Supine Lower Trunk Rotation  - 1 x daily - 3-4 x weekly - 1-2 sets - 10 reps - Supine Pelvic Tilt  - 1 x daily - 3-4 x weekly - 1-2 sets - 10 reps - Supine Straight Leg Lumbar Rotation  Stretch  - 1 x daily - 7 x weekly - 3 sets - 5-10 reps - 10-15 second hold  Access Code: [email or web address] URL: [email or web address] Date: 08/15/2023 Prepared by: Alethia Berthold Plaster  Exercises - Sit to Stand Without Arm Support  - 1 x daily - 7 x weekly - 3 sets - 10 reps    GOALS: Goals reviewed with patient? Yes  SHORT TERM GOALS: Target date: 08/27/2023   Pt will perform initial HEP w/CGA from caregiver for improved strength, balance, transfers and gait.  Baseline: pt reports performing HEP on his own at home  Goal status: MET   2.  Pt will improve 5 x STS to less than or equal to 15 seconds w/proper body mechanics to demonstrate improved functional strength and transfer efficiency.   Baseline: 13.41s w/BUE support and improper body mechanics   15.8 seconds with no UE support, with proper body mechanics  Goal status: PARTIALLY MET  3.  Pt will be compliant w/use of RW at all times for reduced fall frequency and independence  Baseline: Pt not using   Pt using SPC, not safe with RW  Goal status: N/A  4.  Pt and wife will verbalize understanding of freezing  strategies to implement at home and in community for reduced fall frequency.   Baseline:  Goal status: MET  5.  Pt will improve normal TUG to less than or equal to 15 seconds w/safest AD and CGA for improved functional mobility and decreased fall risk.  Baseline: 15.68s w/SPC w/min A  15.6 seconds with SPC, SBA/CGA no freezing episodes  Goal status: PARTIALLY MET    LONG TERM GOALS: Target date: 09/24/2023   Pt will perform final HEP w/SBA from caregiver for improved strength, balance, transfers and gait.  Baseline:  Goal status: INITIAL  2.  Pt will improve gait velocity to at least 2.9 ft/s w/LRAD mod I for improved gait efficiency and independence  Baseline: 2.7 ft/s w/SPC and CGA-min A Goal status: INITIAL  3.  Pt will improve 5 x STS to less than or equal to 13 seconds w/proper body mechanics  to demonstrate improved functional strength and transfer efficiency.   Baseline: 13.41s w/BUE support and improper body mechanics  Goal status: INITIAL  4.  Pt will improve normal TUG to less than or equal to 13 seconds w/safest AD and SBA for improved functional mobility and decreased fall risk.  Baseline: 15.68s w/SPC w/min A Goal status: REVISED   5.  Pt will verbalize understanding of local PD community resources, including fitness post DC.   Baseline: provided handout on 11/18 Goal status: INITIAL   ASSESSMENT:  CLINICAL IMPRESSION: Today's skilled session focused on balance strategies with SLS, amplitude of stepping, anterior weight shift with sit <> stands, and changing directions during gait in the forwards/backwards direction. Pt does need reminder cues to make sure that he turns all the way around and picks up his feet prior to sitting down on a mat table/chair. Pt leaning forward and reaching towards mat table and then going to turn, resulting in shuffling/freezing. With changing in directions with gait, even with addition of cognitive dual tasking, pt with no episodes of freezing today, just having shorter step length in the posterior direction. Pt in agreement to D/C from PT at next session with plan for 6 month PD screens. Will continue per POC.       OBJECTIVE IMPAIRMENTS: Abnormal gait, decreased balance, decreased cognition, decreased knowledge of condition, decreased knowledge of use of DME, decreased mobility, difficulty walking, decreased strength, decreased safety awareness, impaired perceived functional ability, improper body mechanics, and pain  ACTIVITY LIMITATIONS: carrying, lifting, bending, standing, squatting, stairs, transfers, reach over head, hygiene/grooming, and locomotion level  PARTICIPATION LIMITATIONS: meal prep, cleaning, laundry, medication management, interpersonal relationship, driving, shopping, community activity, occupation, and yard  work  PERSONAL FACTORS: Age, Behavior pattern, Fitness, Past/current experiences, Transportation, and 1 comorbidity: PD  are also affecting patient's functional outcome.   REHAB POTENTIAL: Fair due to poor compliance w/therapy recommendations  CLINICAL DECISION MAKING: Evolving/moderate complexity  EVALUATION COMPLEXITY: Moderate  PLAN:  PT FREQUENCY: 2x/week  PT DURATION: 8 weeks (POC written for 10 weeks due to delay in scheduling)   PLANNED INTERVENTIONS: 56213- PT Re-evaluation, 97110-Therapeutic exercises, 97530- Therapeutic activity, 97112- Neuromuscular re-education, 97535- Self Care, [id number]- Manual therapy, [id number]- Gait training, Balance training, Stair training, and DME instructions  PLAN FOR NEXT SESSION: check goals and D/C with plan for screens in 6 months. Review HEP and make sure pt feels good about it    Drake Leach, PT, DPT 09/16/2023, 2:05 PM

## 2023-09-19 ENCOUNTER — Ambulatory Visit: Payer: Medicare Other | Admitting: Occupational Therapy

## 2023-09-19 ENCOUNTER — Ambulatory Visit: Payer: Medicare Other | Admitting: Physical Therapy
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
## 2023-09-23 ENCOUNTER — Encounter: Payer: Self-pay | Admitting: Neurology

## 2023-09-23 ENCOUNTER — Ambulatory Visit: Payer: Medicare Other | Admitting: Physical Therapy

## 2023-09-23 ENCOUNTER — Other Ambulatory Visit: Payer: Self-pay | Admitting: Neurology

## 2023-09-23 ENCOUNTER — Ambulatory Visit: Payer: Medicare Other | Admitting: Occupational Therapy

## 2023-09-23 DIAGNOSIS — G20B1 Parkinson's disease with dyskinesia, without mention of fluctuations: Secondary | ICD-10-CM

## 2023-09-26 ENCOUNTER — Ambulatory Visit: Payer: Medicare Other | Admitting: Physical Therapy

## 2023-09-26 ENCOUNTER — Ambulatory Visit: Payer: Medicare Other | Admitting: Occupational Therapy

## 2023-09-26 DIAGNOSIS — R29818 Other symptoms and signs involving the nervous system: Secondary | ICD-10-CM | POA: Diagnosis not present

## 2023-09-26 DIAGNOSIS — M6281 Muscle weakness (generalized): Secondary | ICD-10-CM

## 2023-09-26 DIAGNOSIS — R2689 Other abnormalities of gait and mobility: Secondary | ICD-10-CM | POA: Diagnosis not present

## 2023-09-26 DIAGNOSIS — R278 Other lack of coordination: Secondary | ICD-10-CM

## 2023-09-26 DIAGNOSIS — G20A1 Parkinson's disease without dyskinesia, without mention of fluctuations: Secondary | ICD-10-CM

## 2023-09-26 DIAGNOSIS — R2681 Unsteadiness on feet: Secondary | ICD-10-CM | POA: Diagnosis not present
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
## 2023-09-26 NOTE — Therapy (Signed)
OUTPATIENT PHYSICAL THERAPY NEURO TREATMENT- DISCHARGE SUMMARY   Patient [name] MRN: [id number] DOB:1946-02-17, 77 y.o., male Today's Date: 09/26/2023   PCP: Emilio Aspen, MD REFERRING PROVIDER: [name], Octaviano Batty, DO  PHYSICAL THERAPY DISCHARGE SUMMARY  Visits from Start of Care: 13  Current functional level related to goals / functional outcomes: Pt is safest w/use of SPC at all times for mobility at Mod I-S* level.    Remaining deficits: High fall risk, decreased safety awareness, freezing of gait w/turns and in doorways, impulsivity    Education / Equipment: HEP, 54mo PD screens, December PD community resources    Patient agrees to discharge. Patient goals were partially met. Patient is being discharged due to maximized rehab potential.     END OF SESSION:  PT End of Session - 09/26/23 1403     Visit Number 13    Number of Visits 17   Plus eval   Date for PT Re-Evaluation 10/08/23    Authorization Type [employer] Medicare    PT Start Time 1401    PT Stop Time 1430   DC   PT Time Calculation (min) 29 min    Equipment Utilized During Treatment Gait belt    Activity Tolerance Patient tolerated treatment well    Behavior During Therapy WFL for tasks assessed/performed               Past Medical History:  Diagnosis Date   Abnormal involuntary movement 02/18/2018   Allergic rhinitis    Arthritis    BPH (benign prostatic hyperplasia)    Diabetes (HCC)    Dysphagia, pharyngeal phase    GERD diagnosed on barium swallow. Has small hiatal hernia. Symptomatically somewhat better on omeprazole but not entirely. We'll try b.i.d. therapy   Edema    1+ in both ankles, likely multifactorial including medication such as Requip   Erectile dysfunction    Staxyn 10 mg or Viagra worked well. 3 samples of Cialis 20 mg provided   GERD (gastroesophageal reflux disease)    Hypercholesteremia    Hypertension    Nephrolithiasis    Onychomycosis of toenail    April 27, 2013 - Dr. Merwyn Katos - podiatry, was in [place] - treating with oral Lamisil and topical nail therapy   Parkinson's disease [facility])     followed by Dr. Raquel Sarna at [facility] and Lesia Sago, M.D. in [place]   Presbycusis    and tinnitus - Dr. Serena Colonel - August/2013   Renal calculus    Syncope    Past Surgical History:  Procedure Laterality Date   CORONARY STENT INTERVENTION N/A 01/11/2023   Procedure: CORONARY STENT INTERVENTION;  Surgeon: Kathleene Hazel, MD;  Location: [facility];  Service: Cardiovascular;  Laterality: N/A;   HAND SURGERY     INGUINAL HERNIA REPAIR Left 11/04/2015   Procedure: LEFT INGUINAL HERNIA REPAIR WITH MESH;  Surgeon: Darnell Level, MD;  Location: [facility];  Service: General;  Laterality: Left;   INSERTION OF MESH Left 11/04/2015   Procedure: INSERTION OF MESH;  Surgeon: Darnell Level, MD;  Location: [facility];  Service: General;  Laterality: Left;   JOINT REPLACEMENT Bilateral    KNEE SURGERY     LEFT HEART CATH AND CORONARY ANGIOGRAPHY N/A 01/11/2023   Procedure: LEFT HEART CATH AND CORONARY ANGIOGRAPHY;  Surgeon: Kathleene Hazel, MD;  Location: [facility];  Service: Cardiovascular;  Laterality: N/A;   TOTAL KNEE ARTHROPLASTY  Patient Active Problem List   Diagnosis Date Noted   Gross hematuria 07/03/2023   Depression 06/24/2023   Chemosis 06/24/2023   Elevated troponin 04/03/2023   Sepsis (HCC) 04/01/2023   Coronary artery disease involving native coronary 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 angina pectoris 01/11/2023   Chronic combined systolic and diastolic heart failure (HCC)    Dysphagia 04/29/2022   AKI (acute kidney injury) (HCC)    Dehydration    Failure to thrive (child)    Bacteriuria, asymptomatic    Failure to thrive in adult 04/20/2022   Malaise, possible ESBL Klebsiella UTI 04/20/2022   Chronic retention of urine 04/20/2022   Closed lumbar vertebral fracture (HCC)  04/20/2022   UTI (urinary tract infection) 03/01/2022   Leukocytosis 01/01/2022   Hyponatremia 01/01/2022   CKD (chronic kidney disease), stage IIIa (HCC) 01/01/2022   HTN (hypertension) 12/31/2021   History of ESBL Klebsiella pneumoniae infection 12/31/2021   Generalized weakness 12/31/2021   Chronic venous insufficiency 08/29/2021   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   Encounter for general adult medical examination with abnormal findings 02/18/2018   Enlarged prostate 02/18/2018   Hearing loss 02/18/2018   Other long term (current) drug therapy 02/18/2018   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) 02/18/2018   HLD (hyperlipidemia)    Nephrolithiasis    Parkinson disease (HCC)    Diabetes mellitus with coincident hypertension (HCC)    Syncope    Renal calculus    Diabetes (HCC)    Allergic rhinitis    BPH (benign prostatic hyperplasia)    Erectile dysfunction    Dysphagia, pharyngeal phase    Edema    Ureteral calculus 02/09/2013   LEG PAIN 03/14/2010    ONSET DATE: 07/15/2023 (referral)   REFERRING DIAG: G20.B2 (ICD-10-CM) - Parkinson's disease with dyskinesia and fluctuating manifestations (HCC) R26.89 (ICD-10-CM) - Balance problem W19.XXXD (ICD-10-CM) - Fall, subsequent encounter  THERAPY DIAG:  Unsteadiness on feet  Other abnormalities of gait and mobility  Muscle weakness (generalized)  Rationale for Evaluation and Treatment: Rehabilitation  SUBJECTIVE:                                                                                                                                                                                             SUBJECTIVE STATEMENT: Pt reports doing well. Denies falls or acute changes. Has a wound on his thumb and left hand, states he got it caught in a doorway.   Pt accompanied by:  Self  PERTINENT HISTORY: HTN, chronic bilateral lower extremity edema, GERD, HLD, BPH, Parkinson's disease  PAIN:  Are you  having pain? Yes: NPRS  scale: 3-4/10 Pain location: low back Pain description: achy  PRECAUTIONS: Fall  RED FLAGS: None   WEIGHT BEARING RESTRICTIONS: No  FALLS: Has patient fallen in last 6 months? Yes. Number of falls Multiple- unsure the number   LIVING ENVIRONMENT: Lives with: lives with their spouse Lives in: House/apartment - pt reports he lives in the basement due to a fall down the steps last year that resulted in spinal fx  Stairs: Yes: Internal: full flight steps; on right going up Has following equipment at home: Single point cane, Walker - 2 wheeled, Environmental consultant - 4 wheeled, shower chair, and Grab bars  PLOF: Requires assistive device for independence, Needs assistance with ADLs, and Needs assistance with homemaking  PATIENT GOALS: "to improve balance and strength"   OBJECTIVE:  Note: Objective measures were completed at Evaluation unless otherwise noted.  DIAGNOSTIC FINDINGS: MRI of brain on 07/05/23   IMPRESSION: 1. No acute intracranial abnormality. 2. Mild for age signal changes in the cerebral white matter and a solitary chronic microhemorrhage in the right midbrain most compatible with chronic small vessel disease.  CT of chest on 07/03/23  IMPRESSION: 1. Minimally displaced left sixth rib fracture, without pneumothorax or effusion. 2. Healing left seventh and eighth rib fractures. 3. Cholelithiasis. 4. Left nephrolithiasis without hydronephrosis. 5. Chronic L1 compression fracture deformity. 6. Coronary and aortic Atherosclerosis (ICD10-I70.0).  COGNITION: Overall cognitive status: Impaired and extremely poor insight into deficits    SENSATION: Pt denies numbness/tingling    EDEMA: Chronic BLE edema    POSTURE: rounded shoulders, forward head, increased thoracic kyphosis, and weight shift right  LOWER EXTREMITY MMT:    MMT Right Eval Left Eval  Hip flexion    Hip extension    Hip abduction    Hip adduction    Hip internal rotation    Hip  external rotation    Knee flexion    Knee extension    Ankle dorsiflexion    Ankle plantarflexion    Ankle inversion    Ankle eversion    (Blank rows = not tested)  BED MOBILITY:  Independent per pt   TRANSFERS: Assistive device utilized: Single point cane  Sit to stand: CGA Stand to sit: CGA Pt required min A and mod verbal cues to fully turn and back up prior to sitting in chair, as he almost missed chair when walking into evaluation room. Heavy reliance on BUE support w/poor anterior weight shift. Unable to obtain full hip/knee extension in stance  VITALS  There were no vitals filed for this visit.   TODAY'S TREATMENT:    Ther Act  LTG Assessment  Reviewed December PD community resources sheet and pt verbalized understanding. Strongly recommended PWR moves class, pt stated he would think about it.  Reviewed freezing strategies as pt regressing to old habits this date, reaching for chairs and doorways while freezing rather than stopping and resetting, resulting in significant instability requiring min A for safety. Strongly encouraged pt to continue working on proper technique at home, as he has demonstrated ability to do so and is much safer when he slows down and focuses on taking large steps. Pt in agreement that he is safer when he slows down but continued to reach out of BOS when freezing throughout session.    [facility] PT Assessment - 09/26/23 1416       Transfers   Five time sit to stand comments  12.5s   BUE support     Ambulation/Gait   Gait velocity 32.8'  over 11.91s = 2.75 ft/s   no AD, CGA     Timed Up and Go Test   Normal TUG (seconds) 15.84   SPC and CGA           Discussed plan to schedule 75mo PD screens and educated pt on how to obtain new PT referral sooner if his balance or mobility declines. Pt verbalized understanding.   GAIT: Gait pattern: step through pattern, decreased arm swing- Right, decreased arm swing- Left, decreased stride length, decreased  hip/knee flexion- Right, decreased hip/knee flexion- Left, decreased ankle dorsiflexion- Right, decreased ankle dorsiflexion- Left, shuffling, lateral hip instability, decreased trunk rotation, trunk flexed, and poor foot clearance- Right Distance walked: Various clinic distances  Assistive device utilized: Single point cane Level of assistance: SBA, CGA, and Min A Comments: Pt demonstrating decline in freezing strategies this date, reaching for a chair prior to being close enough to sit down, furniture walking rather than stopping and resetting and moving too quickly. Pt able to correct these things but seemingly chooses not to.    PATIENT EDUCATION: Education details: See above Person educated: Patient Education method: Medical illustrator Education comprehension: verbalized understanding and needs further education  HOME EXERCISE PROGRAM: Standing PWR Moves   Access Code: [email or web address] URL: [email or web address] Date: 09/02/2023 Prepared by: Sherlie Ban  Exercises - Standing Quarter Turn with Counter Support  - 1 x daily - 7 x weekly - 3 sets - 10 reps - Seated Hamstring Stretch  - 1 x daily - 3-4 x weekly - 3 sets - 30 hold - Prone Press Up  - 1 x daily - 3-4 x weekly - 1-2 sets - 10 reps - Supine Lower Trunk Rotation  - 1 x daily - 3-4 x weekly - 1-2 sets - 10 reps - Supine Pelvic Tilt  - 1 x daily - 3-4 x weekly - 1-2 sets - 10 reps - Supine Straight Leg Lumbar Rotation Stretch  - 1 x daily - 7 x weekly - 3 sets - 5-10 reps - 10-15 second hold  Access Code: [email or web address] URL: [email or web address] Date: 08/15/2023 Prepared by: Alethia Berthold Analiyah Lechuga  Exercises - Sit to Stand Without Arm Support  - 1 x daily - 7 x weekly - 3 sets - 10 reps    GOALS: Goals reviewed with patient? Yes  SHORT TERM GOALS: Target date: 08/27/2023   Pt will perform initial HEP w/CGA from caregiver for improved strength, balance, transfers and gait.  Baseline: pt  reports performing HEP on his own at home  Goal status: MET   2.  Pt will improve 5 x STS to less than or equal to 15 seconds w/proper body mechanics to demonstrate improved functional strength and transfer efficiency.   Baseline: 13.41s w/BUE support and improper body mechanics   15.8 seconds with no UE support, with proper body mechanics  Goal status: PARTIALLY MET  3.  Pt will be compliant w/use of RW at all times for reduced fall frequency and independence  Baseline: Pt not using   Pt using SPC, not safe with RW  Goal status: N/A  4.  Pt and wife will verbalize understanding of freezing strategies to implement at home and in community for reduced fall frequency.   Baseline:  Goal status: MET  5.  Pt will improve normal TUG to less than or equal to 15 seconds w/safest AD and CGA for improved functional mobility and decreased fall risk.  Baseline: 15.68s w/SPC w/min A  15.6 seconds with SPC, SBA/CGA  no freezing episodes  Goal status: PARTIALLY MET    LONG TERM GOALS: Target date: 09/24/2023   Pt will perform final HEP w/SBA from caregiver for improved strength, balance, transfers and gait.  Baseline:  Goal status: MET  2.  Pt will improve gait velocity to at least 2.9 ft/s w/LRAD mod I for improved gait efficiency and independence  Baseline: 2.7 ft/s w/SPC and CGA-min A; 2.75 ft/s no AD and CGA Goal status: NOT MET   3.  Pt will improve 5 x STS to less than or equal to 13 seconds w/proper body mechanics to demonstrate improved functional strength and transfer efficiency.   Baseline: 13.41s w/BUE support and improper body mechanics; 12.50s w/BUE support  Goal status: MET  4.  Pt will improve normal TUG to less than or equal to 13 seconds w/safest AD and SBA for improved functional mobility and decreased fall risk.  Baseline: 15.68s w/SPC w/min A; 15.84s w/SPC and CGA Goal status: NOT MET   5.  Pt will verbalize understanding of local PD community resources,  including fitness post DC.   Baseline: provided handout on 11/18 and 12/12  Goal status: MET   ASSESSMENT:  CLINICAL IMPRESSION: Emphasis of skilled PT session on LTG assessment and DC from PT. Pt has met 3 of 5 LTGs, verbalizing understanding of community PD resources, reporting independence w/HEP and performing 5x STS w/proper body mechanics in goal time. Pt did not meet his gait speed or TUG goal, but has improved speed since eval as well as stability with turns. Pt most limited by significant freezing and poor freezing techniques despite max education provided by several therapists. Pt verbalized understanding that he moves too quickly and this causes him to lose balance, but continues to demonstrate this with transfers, turns and gait in narrow spaces. Pt very aware of his freezing triggers (doorways, turns, carrying items, passing others and carrying objects) and did improve his freezing technique for a few weeks but has seemingly regressed to old habits and fights the freeze despite cues from therapist, requiring min A to stabilize at times. Pt is safest w/use of SPC at all times and does have order for a U-step, but is declining to use at this time. Provided thorough education on importance of continuing exercising at home and working on freezing strategies to reduce fall frequency and maintain independence, which pt verbalized understanding. Pt scheduled for 60mo PD screens and verbalized agreement to DC from PT this date due to maximized potential.    OBJECTIVE IMPAIRMENTS: Abnormal gait, decreased balance, decreased cognition, decreased knowledge of condition, decreased knowledge of use of DME, decreased mobility, difficulty walking, decreased strength, decreased safety awareness, impaired perceived functional ability, improper body mechanics, and pain  ACTIVITY LIMITATIONS: carrying, lifting, bending, standing, squatting, stairs, transfers, reach over head, hygiene/grooming, and locomotion  level  PARTICIPATION LIMITATIONS: meal prep, cleaning, laundry, medication management, interpersonal relationship, driving, shopping, community activity, occupation, and yard work  PERSONAL FACTORS: Age, Behavior pattern, Fitness, Past/current experiences, Transportation, and 1 comorbidity: PD  are also affecting patient's functional outcome.   REHAB POTENTIAL: Fair due to poor compliance w/therapy recommendations  CLINICAL DECISION MAKING: Evolving/moderate complexity  EVALUATION COMPLEXITY: Moderate  PLAN:  PT FREQUENCY: 2x/week  PT DURATION: 8 weeks (POC written for 10 weeks due to delay in scheduling)   PLANNED INTERVENTIONS: 40981- PT Re-evaluation, 97110-Therapeutic exercises, 97530- Therapeutic activity, 97112- Neuromuscular re-education, 97535- Self Care, 19147- Manual therapy, [phone]- Gait training, Balance training, Stair training, and DME instructions  Jill Alexanders  Aqueelah Cotrell, PT, DPT 09/26/2023, 3:48 PM

## 2023-09-26 NOTE — Patient Instructions (Signed)
Performing Daily Activities with Big Movements  Pick at least 2 activities a day and perform with BIG, DELIBERATE movements/effort.  Try different activities each day. This can make the activity easier and turn daily activities into exercise to prevent problems in the future!  If you are standing during the activity, make sure to keep feet apart and stand with good/big/posture.  Examples: Dressing  Pull-over shirt:  good posture, bring shirt to head (don't bring head down), deliberately push arms into sleeves Jacket:  stand with feet apart, twist when putting on jacket, deliberate push arms into sleeves Underwear/Pants:  Sit, lean forward, and push foot into pants deliberately Open hands to pull down shirt/put on socks/pull up pants--get more material in your hand  Buttoning - Open hands big before fastening each button, deliberate movement (angry buttons--push button through hole), unfasten by using pull-push method   Bathing - Wash/dry with long strokes  Brushing your teeth - Big, slow movements  Cutting food - Long deliberate cuts, put tip of knife down in front of food  Eating - Hold utensil in the middle (not the end), hold fork straight up and down to stab food  Picking up a cup/bottle - Open hand up big and get object all the way in palm  Opening jar/bottle - Move as much as you can with each turn, twist wrist  Putting on seatbelt - Feet apart, twist when reaching, look at where you are reaching  Hanging up clothes/getting clothes down from closet - Reach with big effort, open hand, straighten elbow  Putting away groceries/dishes - Reach with big effort  Wiping counter/table - Move in big, long strokes, open hand  Stirring while cooking - Exaggerate movement  Cleaning windows - Feet apart, move in big, long strokes  Sweeping/Raking- Feet apart and one foot in front of the other, move arms in big, long strokes  Vacuuming - Feet apart and one in front of the other, push  with big movement  Folding clothes - Exaggerate arm movements  Washing car - Move in big, long strokes, feet apart  Changing light bulb or Using a screwdriver - Move as much as you can with each turn, twist wrist  Walking into a store/restaurant - Walk with big steps, good posture, swing arms if able  Standing up from a chair/recliner/sofa - Scoot forward, lean forward, and stand with big effort  Picking up something from floor/reaching in low cabinet - Get close to object, Position feet apart and one in front of the other.   Keeping Thinking Skills Sharp: 1. Jigsaw puzzles  2. Card/board games  3. Talking on the phone/social events  4. [email or web address]  5. Online games  6. Word serches/crossword puzzles  7.  Logic puzzles  8. Aerobic exercise (stationary bike)  9. Eating balanced diet (fruits & veggies)  10. Drink water  11. Try something new--new recipe, hobby  12. Crafts  13. Do a variety of activities that are challenging  14.  Plan weekly meals and write a grocery list  15. Add cognitive activities to walking/exercising (think of animal/food/city with each letter of the alphabet, counting backwards, thinking of as many vegetables as you can, etc.).--Only do this  If safe (no freezing/falls).   Ways to prevent future Parkinson's related complications:  1.   Exercise regularly/daily.   2.   Focus on BIGGER movements during daily activities- really reach overhead, straighten elbows and extend fingers  3.   When dressing (especially jacket/coat) or reaching for your  seatbelt make sure to use your body to assist by twisting while you reach and looking at where you are reaching - this can help to minimize stress on the shoulder and reduce the risk of a rotator cuff tear  4.   Swing your arms when you walk (unless using walker)! People with PD are at increased risk for frozen shoulder and swinging your arms can reduce this risk.  5. Drink plenty of water and eat a  high fiber diet (fresh fruits and veggies, nuts/seeds, fish). Avoid canned foods, red meats, and dairy when possible  6. Do NOT take your Parkinson's medication around meals (avoid taking 30 min before a meal, or 1 hour after a meal), especially protein as it blocks the absorption of the medicine and will not work effectively  7. Keep your feet apart when you are standing (wider stance) to allow you to have better balance and to reach further with your arms. Also make sure your feet are apart before standing up.

## 2023-09-30 ENCOUNTER — Ambulatory Visit: Payer: Medicare Other | Admitting: Occupational Therapy

## 2023-09-30 ENCOUNTER — Encounter: Payer: Self-pay | Admitting: Occupational Therapy

## 2023-09-30 DIAGNOSIS — I5041 Acute combined systolic (congestive) and diastolic (congestive) heart failure: Secondary | ICD-10-CM | POA: Diagnosis not present

## 2023-09-30 DIAGNOSIS — E1165 Type 2 diabetes mellitus with hyperglycemia: Secondary | ICD-10-CM | POA: Diagnosis not present

## 2023-09-30 DIAGNOSIS — I10 Essential (primary) hypertension: Secondary | ICD-10-CM | POA: Diagnosis not present

## 2023-09-30 DIAGNOSIS — E785 Hyperlipidemia, unspecified: Secondary | ICD-10-CM | POA: Diagnosis not present

## 2023-09-30 NOTE — Therapy (Signed)
Pt d/c from OT per pt request. Recommend PD screens in 6 months as scheduled.

## 2023-10-03 ENCOUNTER — Encounter: Payer: Medicare Other | Admitting: Occupational Therapy

## 2023-10-14 ENCOUNTER — Encounter: Payer: Medicare Other | Admitting: Occupational Therapy

## 2023-10-17 ENCOUNTER — Encounter: Payer: Medicare Other | Admitting: Occupational Therapy

## 2023-10-21 ENCOUNTER — Encounter: Payer: Medicare Other | Admitting: Occupational Therapy

## 2023-10-22 DIAGNOSIS — R310 Gross hematuria: Secondary | ICD-10-CM | POA: Diagnosis not present

## 2023-10-23 DIAGNOSIS — R310 Gross hematuria: Secondary | ICD-10-CM | POA: Diagnosis not present

## 2023-10-23 DIAGNOSIS — K8020 Calculus of gallbladder without cholecystitis without obstruction: Secondary | ICD-10-CM | POA: Diagnosis not present

## 2023-10-23 DIAGNOSIS — N3289 Other specified disorders of bladder: Secondary | ICD-10-CM | POA: Diagnosis not present

## 2023-10-23 DIAGNOSIS — N200 Calculus of kidney: Secondary | ICD-10-CM | POA: Diagnosis not present

## 2023-10-24 ENCOUNTER — Encounter: Payer: Medicare Other | Admitting: Occupational Therapy

## 2023-10-28 ENCOUNTER — Other Ambulatory Visit: Payer: Self-pay | Admitting: Cardiology

## 2023-10-28 ENCOUNTER — Other Ambulatory Visit: Payer: Self-pay | Admitting: Neurology

## 2023-10-28 ENCOUNTER — Encounter: Payer: Medicare Other | Admitting: Occupational Therapy

## 2023-10-28 DIAGNOSIS — G20A1 Parkinson's disease without dyskinesia, without mention of fluctuations: Secondary | ICD-10-CM

## 2023-10-31 ENCOUNTER — Encounter: Payer: Medicare Other | Admitting: Occupational Therapy

## 2023-11-04 ENCOUNTER — Encounter: Payer: Medicare Other | Admitting: Occupational Therapy

## 2023-11-05 ENCOUNTER — Other Ambulatory Visit (HOSPITAL_BASED_OUTPATIENT_CLINIC_OR_DEPARTMENT_OTHER): Payer: Self-pay | Admitting: Cardiology

## 2023-11-06 IMAGING — CT CT HEAD W/O CM
3 series · 15 of 47 positions shown, 18 images · non-contrast
Comparison: No recent examination. MRI examination dated [place]

CLINICAL DATA: Mental status change.



[Series 2: head wo · axial · 0.47mm/px · z∈[-78,+57]mm · 9 of 33 slices shown, 12 images]
[im 3/33  brain]
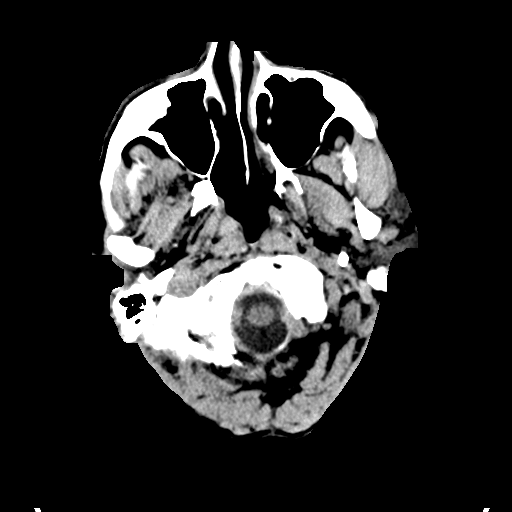
[im 3/33  bone]
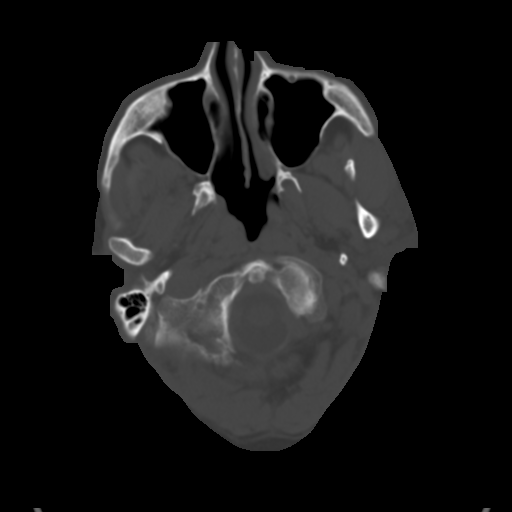
[im 6/33  brain]
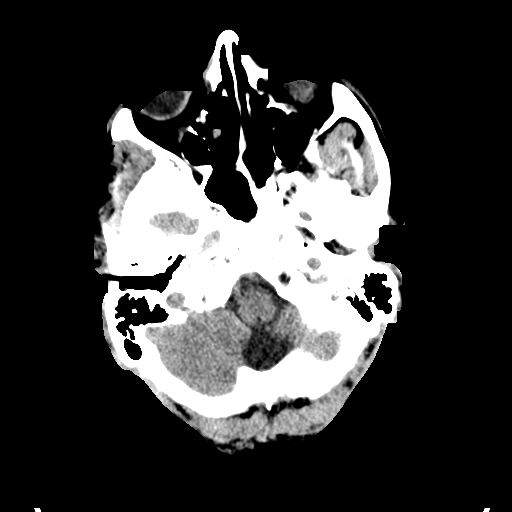
[im 9/33  brain]
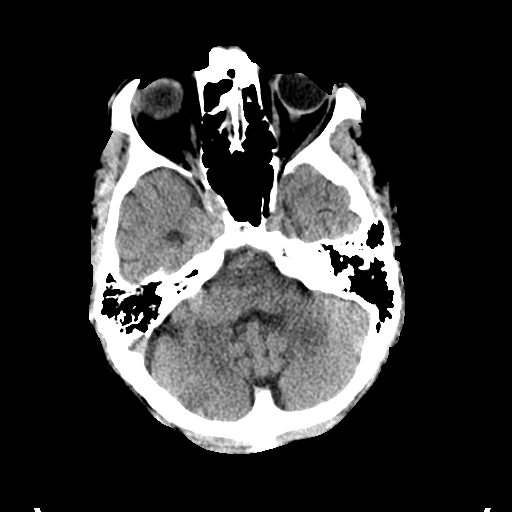
[im 13/33  brain]
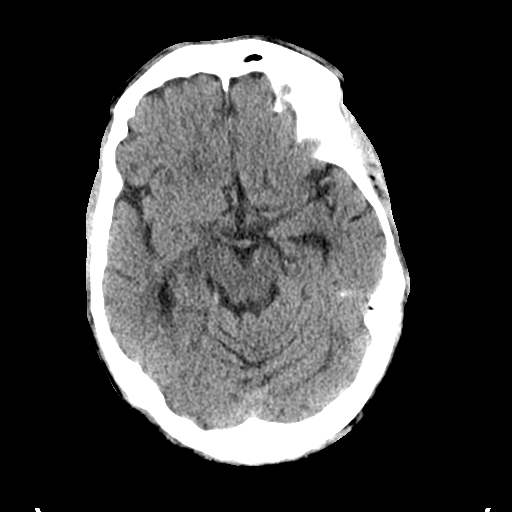
[im 17/33  brain]
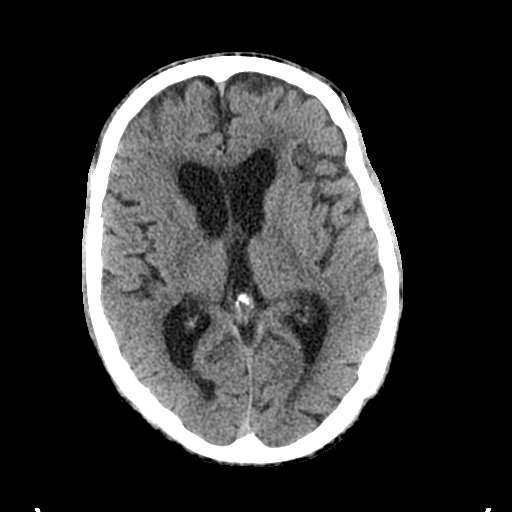
[im 17/33  bone]
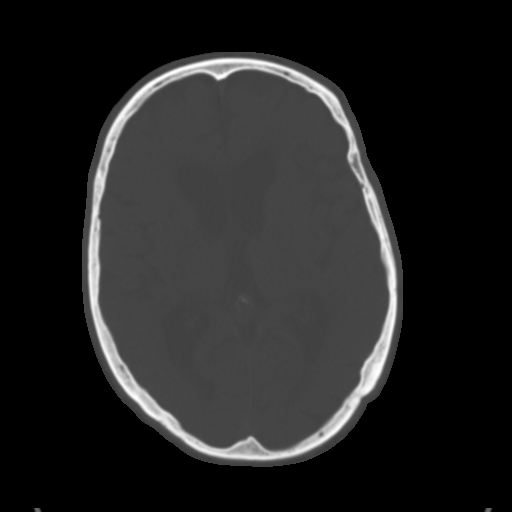
[im 20/33  brain]
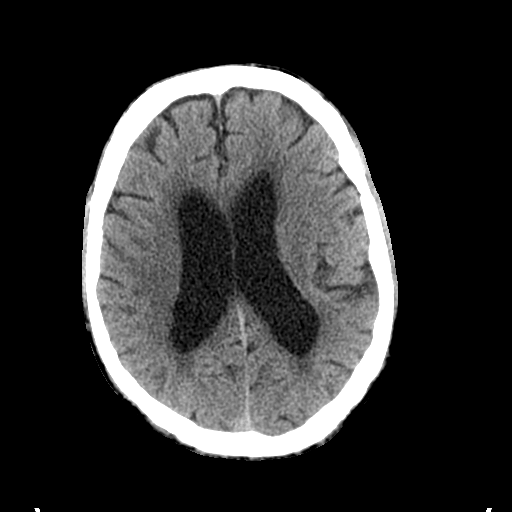
[im 24/33  brain]
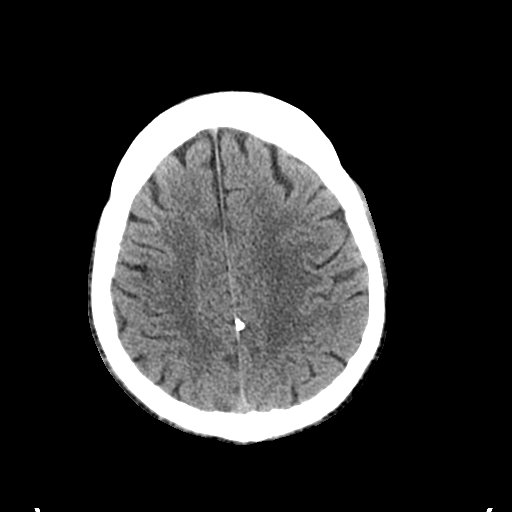
[im 27/33  brain]
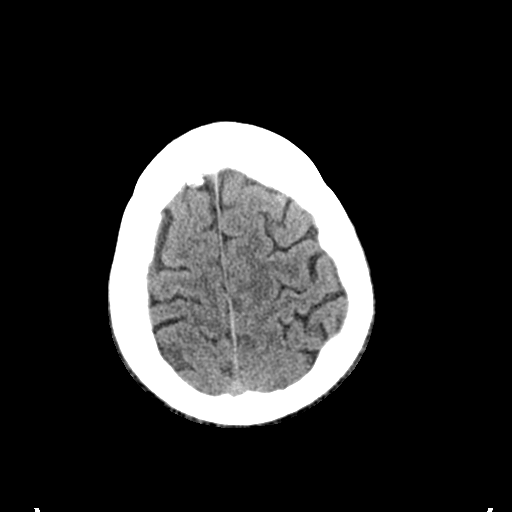
[im 30/33  brain]
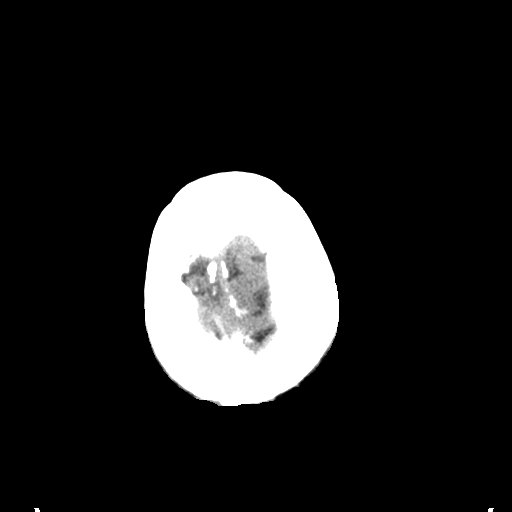
[im 30/33  bone]
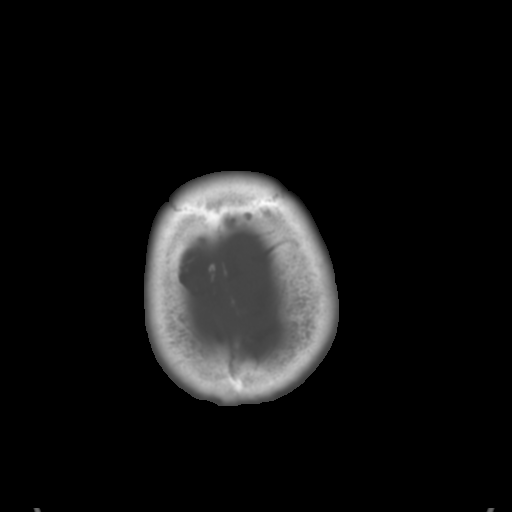

[Series 5: coronal soft tissue · coronal · 0.35mm/px · 3 of 81 slices shown]
[im 27/81  brain]
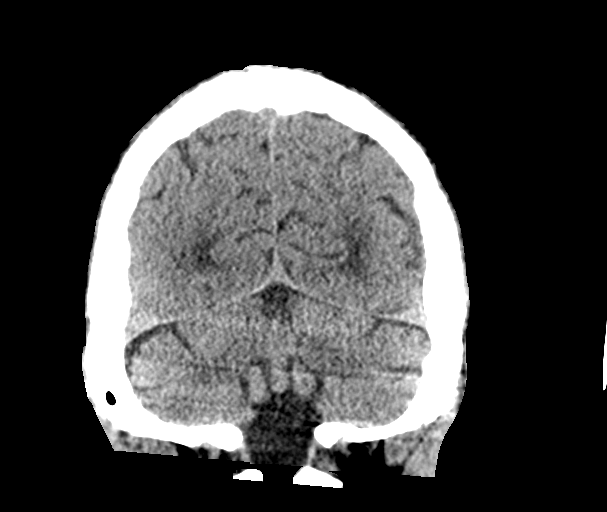
[im 36/81  brain]
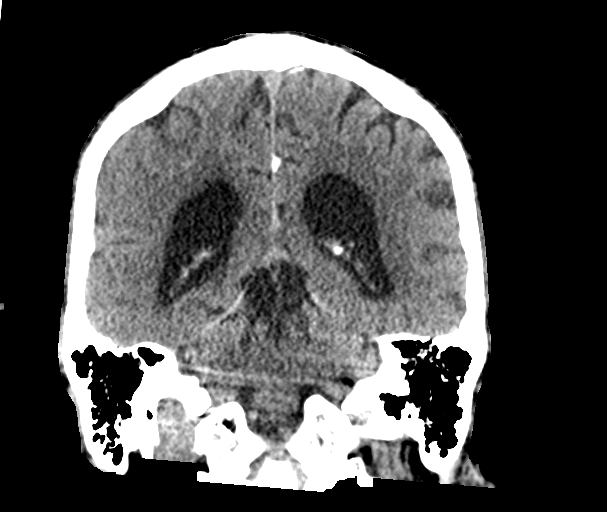
[im 45/81  brain]
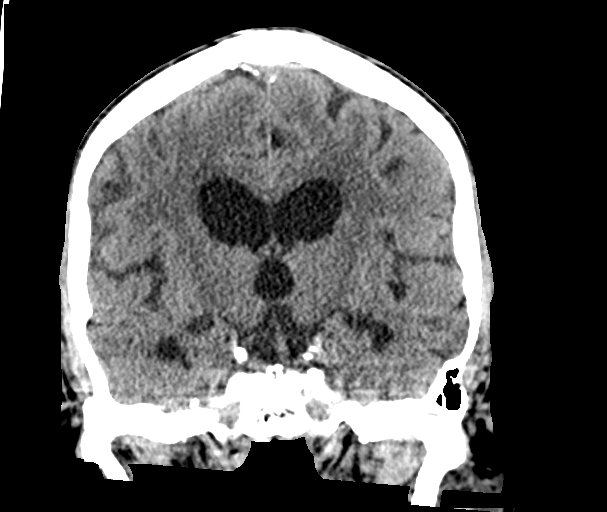

[Series 6: sagittal soft tissue · sagittal · 0.35mm/px · 3 of 71 slices shown]
[im 24/71  brain]
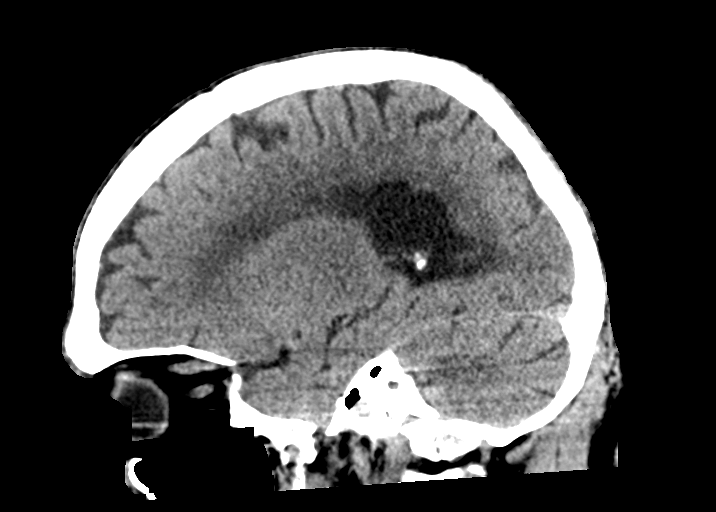
[im 36/71  brain]
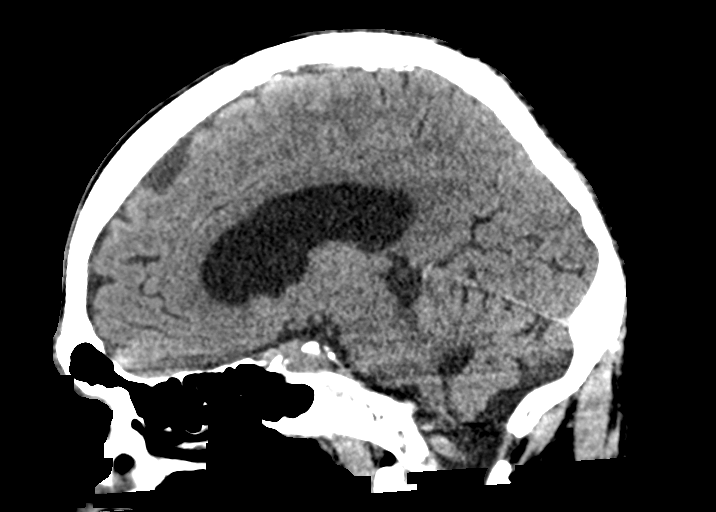
[im 47/71  brain]
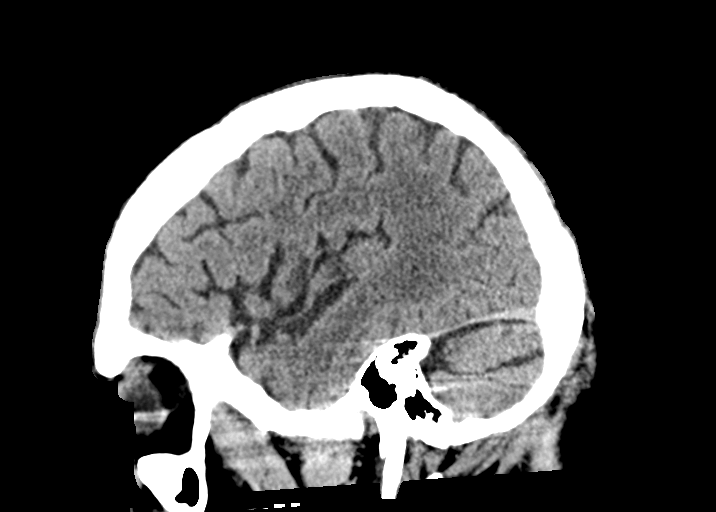

[15 of 47 positions shown; findings below may reference images not displayed]

FINDINGS: Brain: No evidence of acute infarction, hemorrhage, hydrocephalus,
extra-axial collection or mass lesion/mass effect. Mild cerebral
atrophy and chronic microvascular ischemic changes of the white
matter.

Vascular: No hyperdense vessel or unexpected calcification.

Skull: Normal. Negative for fracture or focal lesion.

Sinuses/Orbits: No acute finding.

Other: None.
IMPRESSION: 1.  No acute intracranial abnormality.

2. Mild cerebral atrophy and chronic microvascular ischemic changes
of the white matter.

## 2023-11-07 ENCOUNTER — Other Ambulatory Visit: Payer: Self-pay | Admitting: Cardiology

## 2023-11-07 ENCOUNTER — Encounter: Payer: Medicare Other | Admitting: Occupational Therapy

## 2023-11-11 ENCOUNTER — Encounter: Payer: Medicare Other | Admitting: Occupational Therapy

## 2023-11-12 NOTE — Progress Notes (Unsigned)
Assessment/Plan:   1.  Parkinsons Disease, diagnosed 2010 with symptoms to 2008  -continue carbidopa/levodopa 25/100, 3 po 6 x per day.  Discussed with them that he certainly can spread it out differently than he currently is, but I do not want him increasing the dose.  I do not think he needs anymore.  He certainly looks well treated.  There is no evidence of off and I have never seen him off.  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  All the sx's that are levodopa responsive are well treated.  -Continue entacapone 200 mg with first three dosages of levodopa   -For now, we will continue ropinirole 3 mg, 1/1/0.5  -We discussed Vyalev, which is foscarbidopa/foslevodopa pump that is newly FDA approved.  We discussed that it is for motor fluctuations in adults with advanced Parkinson's disease.  We discussed that this likely will not be on Medicare formulary until the latter half of 2025.  We discussed risks and benefits of this drug.  I am not sure if he needs the drug anyway but can address as it becomes more widely available.  He is not sure he is interested but he may be (he is concerned about size of the pump)  -his main issues with Parkinsons Disease continue to be hospitalizations associated with other illnesses, usually UTI, and he becomes weak and deconditioned and the Parkinson's symptoms get worse.  In addition, the patient is just noncompliant.  He checked himself out of inpatient rehab.  He did do some outpatient rehab, but they noted that patient declined use of  U step.     2.  History of UTI worsening parkinsonism  -Following with infectious disease closely  -follows with urology for nephrolithiasis  3.  Day/night reversal  -Long discussion again with patient regarding sleep hygiene.  Discussed with him that melatonin can help him to get back on a regular cycle, but that he really needs to work on staying active during the day,  with a regular schedule.  Discussed that day/night reversal really can lead to hallucinations, which ultimately can lead to discontinuation of ropinirole and subsequent worsening of motor symptoms.  4.  Severe CAD  -Cardiac cath in March, 2024 with evidence of severe dual antiplatelet therapy with aspirin/Plavix  5.   Uncontrolled DM  -hgbA1c is nearly 12!  He has just started meeting with endocrinology nurse practitioner at [facility] (works with Dr. Evlyn Kanner) Subjective:   [name] was seen today in follow up for Parkinsons disease.  My previous records were reviewed prior to todays visit as well as outside records available to me.  Records are reviewed since last visit.  Patient was admitted to the hospital in early September with the recurrent UTI and malaise.  Patient was in the hospital for about 2 days and discharged, when he came back about 1 week later with a fall and acute rib fracture and gross hematuria after a mechanical fall in the shower.  He did not lose consciousness without fall.  CT head was unremarkable.  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  MRI brain was nonacute.  The episode quickly resolved.  The patient was discharged to SNF the following day, but his  wife called right when he got to [facility] stating that the patient was going to leave the facility, which he did.  We obviously did not recommend this, as noncompliance is really been the source of many of the problems, including falls.  He did attend physical therapy at [facility] as an outpatient where we sent the referral when he left.  13 sessions were attended and partial goals were met per physical therapy.  Physical therapy notes indicate "patient most limited by significant freezing and poor freezing  techniques despite maximum education provided by several therapist."  Therapist noted patient "does have order for U step, but is declining to use at this time."  He has had 1 fall since that time.  He was coming out of the bank and going through the door.  This was about 3 weeks ago.  He c/o balance continuing to be an issue.    Current prescribed movement disorder medications: carbidopa/levodopa 25/100, 3 tablets 5 x per day Entacapone, 200 mg with first 3 dosages of levodopa  requip 3 mg, 1 tablet in the morning, 1 in the afternoon, half tablet in the evening  Inbrija   PREVIOUS MEDICATIONS: neupro (helped but costly); requip; levodopa IR; amantadine (d/c when confused)  ALLERGIES:   Allergies  Allergen Reactions   Nucynta [Tapentadol] Other (See Comments)    Made the patient not feel well. "He did not feel well at all."   Oxycodone Itching   Sudafed [Pseudoephedrine Hcl] Other (See Comments)    Problems urinating    Tramadol Hcl Itching and Other (See Comments)    Can tolerate, however (in 2023)    CURRENT MEDICATIONS:  Outpatient Encounter Medications as of 11/13/2023  Medication Sig   acetaminophen (TYLENOL) 325 MG tablet Take 2 tablets (650 mg total) by mouth every 6 (six) hours.   aspirin EC 81 MG tablet Take 81 mg by mouth daily. Swallow whole.   calcium carbonate (TUMS) 500 MG chewable tablet Chew 2 tablets by mouth daily as needed for indigestion or heartburn.   carbidopa-levodopa (SINEMET IR) 25-100 MG tablet TAKE 3 TABLETS BY MOUTH AT 7:00 AM, 3 TABS AT 10:00 AM, 3 TABS AT 12:00 PM, 3 TABS AT 2:00 PM, 3 TABS AT 4:00, AND 3 TABS AT 6:00 PM   Cholecalciferol (VITAMIN D) 50 MCG (2000 UT) tablet Take 2,000 Units by mouth daily.   clopidogrel (PLAVIX) 75 MG tablet TAKE 1 TABLET (75 MG TOTAL) BY MOUTH DAILY WITH BREAKFAST   entacapone (COMTAN) 200 MG tablet TAKE 1 TABLET BY MOUTH 3 TIMES DAILY (WITH FIRST THREE DOSAGES OF CARBIDOPA/LEVODOPA).   furosemide (LASIX) 20 MG tablet  Take 1 tablet (20 mg total) by mouth 2 (two) times daily.   GLOBAL EASE INJECT PEN NEEDLES 31G X 5 MM MISC Inject into the skin.   insulin glargine-yfgn (SEMGLEE) 100 UNIT/ML injection Inject 0.08 mLs (8 Units total) into the skin at bedtime.   insulin lispro (HUMALOG) 100 UNIT/ML KwikPen Inject 2-15 Units into the skin 4 (four) times daily -  before meals and at bedtime. CBG 121 - 150: 2 units, CBG 151 - 200: 3 units, CBG 201 - 250: 5 units, CBG 251 - 300: 8 units, CBG 301 - 350: 11 units, CBG 351 - 400: 15 units (Patient taking differently: Inject 2-3 Units into the skin See admin instructions. Inject 2-3 units into the skin three times a day before meals, per sliding scale)   lidocaine (LIDODERM) 5 % Place 1 patch onto  the skin daily. Remove & Discard patch within 12 hours or as directed by MD   methenamine (HIPREX) 1 g tablet TAKE 1 TABLET BY MOUTH 2 TIMES DAILY. TAKE WITH ANY VITAMIN C, TWICE A DAY FOR UTI PREVENTION (Patient taking differently: Take 0.5 g by mouth 4 (four) times daily.)   nitroGLYCERIN (NITROSTAT) 0.4 MG SL tablet Place 1 tablet (0.4 mg total) under the tongue every 5 (five) minutes as needed. (Patient taking differently: Place 0.4 mg under the tongue every 5 (five) minutes as needed for chest pain.)   ondansetron (ZOFRAN) 4 MG tablet Take 1 tablet (4 mg total) by mouth every 6 (six) hours as needed for nausea.   pantoprazole (PROTONIX) 40 MG tablet TAKE 1 TABLET (40 MG TOTAL) BY MOUTH DAILY   potassium chloride (KLOR-CON) 10 MEQ tablet Take 10 mEq by mouth 2 (two) times daily.   rOPINIRole (REQUIP) 3 MG tablet TAKE 1 TABLET IN THE MORNING, TAKE 1 TABLET IN THE AFTERNOON, AND 1/2 TABLET IN THE EVENING   rosuvastatin (CRESTOR) 40 MG tablet Take 1 tablet (40 mg total) by mouth daily.   sacubitril-valsartan (ENTRESTO) 24-26 MG TAKE 1 TABLET BY MOUTH TWICE DAILY.   sertraline (ZOLOFT) 50 MG tablet Take 50 mg by mouth daily.   tamsulosin (FLOMAX) 0.4 MG CAPS capsule Take 0.4 mg by  mouth in the morning and at bedtime.   triamcinolone (NASACORT) 55 MCG/ACT AERO nasal inhaler Place 2 sprays into the nose daily as needed (for allergies or rhinitis).   vitamin B-12 (CYANOCOBALAMIN) 1000 MCG tablet Take 2,000 mcg by mouth daily.   Continuous Glucose Sensor (DEXCOM G7 SENSOR) MISC 1 each by Does not apply route as needed. (Patient taking differently: Inject 1 each into the skin See admin instructions. Place 1 new sensor into the skin every 10 days)   No facility-administered encounter medications on file as of 11/13/2023.    Objective:   PHYSICAL EXAMINATION:    VITALS:   Vitals:   11/13/23 1249  BP: 129/66  Pulse: 65  Resp: 20  SpO2: 96%  Weight: 191 lb (86.6 kg)     Wt Readings from Last 3 Encounters:  11/13/23 191 lb (86.6 kg)  07/03/23 192 lb 8 oz (87.3 kg)  07/01/23 192 lb 8 oz (87.3 kg)    GEN:  The patient appears stated age and is in NAD. HEENT:  Normocephalic, atraumatic.  The mucous membranes are moist. The superficial temporal arteries are without ropiness or tenderness.  Neurological examination:  Orientation: The patient is alert and oriented x3. Cranial nerves: There is good facial symmetry with min facial hypomimia. The speech is fluent and clear. Soft palate rises symmetrically and there is no tongue deviation. Hearing is intact to conversational tone. Sensation: Sensation is intact to light touch throughout Motor: Strength is at least antigravity x4.  Movement examination: Tone: There is nl tone in the UE/LE. Abnormal movements: mild dyskinesia of the R leg (similar to last visit) Coordination:  There is no significant decremation with any rapid alternating movement. Gait and Station: The patient pushes off of the chair to arise.  As with previous visits, he doesn't have his walker but does have his cane.  He does well in the hall but freezes in the door.    I have reviewed and interpreted the following labs independently    Chemistry       Component Value Date/Time   NA 139 07/07/2023 0637   NA 136 01/02/2023 1442   K  4.0 07/07/2023 0637   CL 103 07/07/2023 0637   CO2 29 07/07/2023 0637   BUN 20 07/07/2023 0637   BUN 22 01/02/2023 1442   CREATININE 1.07 07/07/2023 0637   CREATININE 1.29 (H) 08/21/2022 1558      Component Value Date/Time   CALCIUM 8.8 (L) 07/07/2023 0637   ALKPHOS 84 07/07/2023 0637   AST <5 (L) 07/07/2023 0637   ALT 6 07/07/2023 0637   BILITOT 0.7 07/07/2023 0637       Lab Results  Component Value Date   WBC 5.0 07/07/2023   HGB 13.8 07/07/2023   HCT 42.9 07/07/2023   MCV 93.1 07/07/2023   PLT 216 07/07/2023    Lab Results  Component Value Date   TSH 2.126 06/24/2023    Lab Results  Component Value Date   HGBA1C 11.7 (H) 04/02/2023     Total time spent on today's visit was 40 minutes, including both face-to-face time and nonface-to-face time.  Time included that spent on review of records (prior notes available to me/labs/imaging if pertinent), discussing treatment and goals, answering patient's questions and coordinating care.  Cc:  Emilio Aspen, MD

## 2023-11-13 ENCOUNTER — Ambulatory Visit (INDEPENDENT_AMBULATORY_CARE_PROVIDER_SITE_OTHER): Payer: Medicare Other | Admitting: Neurology

## 2023-11-13 ENCOUNTER — Encounter: Payer: Self-pay | Admitting: Neurology

## 2023-11-13 VITALS — BP 129/66 | HR 65 | Resp 20 | Wt 191.0 lb

## 2023-11-13 DIAGNOSIS — G20B2 Parkinson's disease with dyskinesia, with fluctuations: Secondary | ICD-10-CM

## 2023-11-13 NOTE — Patient Instructions (Signed)
Local and Online Resources for Power over Parkinson's Group?  January 2025 ?  LOCAL [place] PARKINSON'S GROUPS??  Power over Parkinson's Group:???  Upcoming Power over [employer] Meetings/Care Partner Support:? 2nd Wednesdays of the month at 2 pm:  January 8th, February 12th Contact Lynwood Dawley at [email or web address] or Amy Marriott at [email or web address] if interested in participating in this group?  ?  LOCAL EVENTS AND NEW OFFERINGS?  Dance Project Spring 2025:  January 14-May 20, Tuesdays 10-11 am.  All details on website: [email or web address] ACT FITNESS Chair Yoga classes "Train and Gain", Fridays 10 am, ACT Fitness.  Contact Gina at [phone].   PWR! Moves Paint class!  Wednesdays at 10 am.  Please contact Lonia Blood, PT at [email or web address] if interested. Health visitor Classes offering at [employer]!? Tuesdays (Chair Yoga)  and Wednesdays (PWR! Moves)  1:00 pm.?? Contact Aldona Lento [phone] or [email or web address] Drumming for Parkinson's will be held on 2nd and 4th Mondays at 11:00 am.?? Located at the [place] of the [place] ([address] [place]. [place].) *Next class is January 13th.? Contact Albertina Parr at [email or web address] or [phone]?  [employer] Parkinson's Tai Chi Class, Mondays at 11 am.  Call [phone] for details  [facility] at [facility]- [address], [place] Wednesdays- 4:00 - 5:00 PM - specifically for Parkinson's Disease.  Free!  Contact Denny Peon, [place] - [phone] (clinic) or  [phone] (cell) or by email: [email or web address]   ?ONLINE EDUCATION AND SUPPORT?  Parkinson Foundation:? www.parkinson.org?  PD Health at Home continues:? Mindfulness Mondays, Wellness Wednesdays, Fitness Fridays??  Upcoming Education:??  Empowerment through Movement, Wednesday,  January 15th, 1-2 pm A Deep Dive into Deep Brain Stimulation (DBS), Wednesday, January 29th, 1-2 pm Expert Briefing:    Stay tuned Register for virtual education and expert briefings (webinars) at [email or web address].gl  Please check out their website to sign up for emails and see their full online offerings??  ?  [employer] Foundation:? www.michaeljfox.org??  Third Thursday Webinars:? On the third Thursday of every month at 12 p.m. ET, join our free live webinars to learn about various aspects of living with Parkinson's disease and our work to speed medical breakthroughs.?  Upcoming Webinar:? Managing the Hidden Symptoms:  Mood and Motivation Changes in Parkinson's.  Thursday, January 16th at 12 noon.  Check out additional information on their website to see their full online offerings?  ?  [employer]:? www.[email or web address].[email or web address]?  Upcoming Webinar:   Stay tuned Series:? Living with Parkinson's Meetup.?? Third Thursdays each month, 3 pm?  Care Partner Monthly Meetup.? With Jillene Bucks [place].? First Tuesday of each month, 2 pm?  Check out additional information to Live Well Today on their website?  ?  Parkinson and Movement Disorders (PMD) Alliance:? www.pmdalliance.org?  NeuroLife Online:? Online Education Events?  Sign up for emails, which are sent weekly to give you updates on programming and online offerings?  ?  Parkinson's Association of the [place]:? www.parkinsonassociation.org?  Information on online support groups, education events, and online exercises including Yoga, Parkinson's exercises and more-LOTS of information on links to PD resources and online events?  Virtual Support Group through [employer] of the [place]-First Wednesday of each month at 2 pm   MOVEMENT AND EXERCISE OPPORTUNITIES?  PWR! Moves [place] class has returned!  Wednesdays at 10 am.  Please contact Lonia Blood, PT at  [email or web address] if interested. Parkinson's Exercise Class offerings at [employer]. Tuesdays (Chair yoga) and Wednesdays (PWR! Moves)  1:00  pm.?  Contact Aldona Lento [phone] or [email or web address]  Parkinson's Wellness Recovery (PWR! Moves)? www.pwr4life.org?  Info on the PWR! Virtual Experience:? You will have access to our expertise?through self-assessment, guided plans that start with the PD-specific fundamentals, educational content, tips, Q&A with an expert, and a growing Engineering geologist of PD-specific pre-recorded and live exercise classes of varying types and intensity - both physical and cognitive! If that is not enough, we offer 1:1 wellness consultations (in-person or virtual) to personalize your PWR! Dance movement psychotherapist.??  Parkinson [employer] Fridays:??  As part of the PD Health @ Home program, this free video series focuses each week on one aspect of fitness designed to support people living with Parkinson's.? These weekly videos highlight the Parkinson Foundation fitness guidelines for people with Parkinson's disease.?  MenusLocal.com.br?  Dance for PD website is offering free, live-stream classes throughout the week, as well as links to [employer] of classes:? https://danceforparkinsons.org/?  Virtual dance and Pilates for Parkinson's classes: Click on the Community Tab> Parkinson's Movement Initiative Tab.? To register for classes and for more information, visit www.[email or web address].co.za and click the "community" tab.??  [employer] Parkinson's Cycling Classes??  [facility]:? Thursdays @ Noon-Live classes at [employer] ([employer] at [email or web address]?or [phone])?  [employer] [employer]: Classes Tuesday, Wednesday and Thursday (contact [place] at [email or web address] ?or [phone])?  [employer]?  Varied levels of classes are offered Tuesdays and  Thursdays at [facility]??  Stretching with Byrd Hesselbach weekly class is also offered for people with Parkinson's?  To observe a class or for more information, call [phone] or email Patricia Nettle at info@[email or web address]?    ADDITIONAL SUPPORT AND RESOURCES?  Well-Spring Solutions:  Chiropractor:? www.well-springsolutions.org/caregiver-education/caregiver-support-group.? You may also contact Loleta Chance at [facility] -spring.org or [phone].????  Well-Spring Navigator:? Just1Navigator program, a?free service to help individuals and families through the journey of determining care for older adults.? The "Navigator" is a Child psychotherapist, Sidney Ace, who will speak with a prospective client and/or loved ones to provide an assessment of the situation and a set of recommendations for a personalized care plan -- all free of charge, and whether?Well-Spring [employer] offers the needed service or not. If the need is not a service we provide, we are well-connected with reputable programs in town that we can refer you to.? www.well-springsolutions.org or to speak with the Navigator, call [phone].?

## 2023-11-14 ENCOUNTER — Ambulatory Visit: Payer: Medicare Other | Admitting: Neurology

## 2023-11-22 DIAGNOSIS — R3912 Poor urinary stream: Secondary | ICD-10-CM | POA: Diagnosis not present

## 2023-11-22 DIAGNOSIS — R310 Gross hematuria: Secondary | ICD-10-CM | POA: Diagnosis not present

## 2023-11-22 DIAGNOSIS — D4101 Neoplasm of uncertain behavior of right kidney: Secondary | ICD-10-CM | POA: Diagnosis not present

## 2023-11-22 DIAGNOSIS — N3020 Other chronic cystitis without hematuria: Secondary | ICD-10-CM | POA: Diagnosis not present

## 2023-11-25 ENCOUNTER — Other Ambulatory Visit: Payer: Self-pay | Admitting: Neurology

## 2023-11-25 DIAGNOSIS — G20B2 Parkinson's disease with dyskinesia, with fluctuations: Secondary | ICD-10-CM

## 2023-11-26 DIAGNOSIS — Z23 Encounter for immunization: Secondary | ICD-10-CM | POA: Diagnosis not present

## 2023-11-26 DIAGNOSIS — N1831 Chronic kidney disease, stage 3a: Secondary | ICD-10-CM | POA: Diagnosis not present

## 2023-11-26 DIAGNOSIS — Z0000 Encounter for general adult medical examination without abnormal findings: Secondary | ICD-10-CM | POA: Diagnosis not present

## 2023-11-26 DIAGNOSIS — K909 Intestinal malabsorption, unspecified: Secondary | ICD-10-CM | POA: Diagnosis not present

## 2023-11-26 DIAGNOSIS — Z79899 Other long term (current) drug therapy: Secondary | ICD-10-CM | POA: Diagnosis not present

## 2023-11-26 DIAGNOSIS — E1169 Type 2 diabetes mellitus with other specified complication: Secondary | ICD-10-CM | POA: Diagnosis not present

## 2023-11-26 DIAGNOSIS — W19XXXA Unspecified fall, initial encounter: Secondary | ICD-10-CM | POA: Diagnosis not present

## 2023-11-26 DIAGNOSIS — E1122 Type 2 diabetes mellitus with diabetic chronic kidney disease: Secondary | ICD-10-CM | POA: Diagnosis not present

## 2023-11-26 DIAGNOSIS — I5040 Unspecified combined systolic (congestive) and diastolic (congestive) heart failure: Secondary | ICD-10-CM | POA: Diagnosis not present

## 2023-11-26 DIAGNOSIS — E7800 Pure hypercholesterolemia, unspecified: Secondary | ICD-10-CM | POA: Diagnosis not present

## 2023-11-26 DIAGNOSIS — I10 Essential (primary) hypertension: Secondary | ICD-10-CM | POA: Diagnosis not present

## 2023-11-26 DIAGNOSIS — G20B2 Parkinson's disease with dyskinesia, with fluctuations: Secondary | ICD-10-CM | POA: Diagnosis not present

## 2023-12-02 NOTE — Therapy (Signed)
[facility] [address] [place], [place], [address] Phone: [phone]   Fax:  [phone]  Patient Details  Name: ARLES RUMBOLD MRN: [id number] Date of Birth: 1946/07/21 Referring Provider:  No ref. provider found  Encounter Date: 12/02/2023  SPEECH THERAPY DISCHARGE SUMMARY  Visits from Start of Care: 1  Current functional level related to goals / functional outcomes: Never returned to ST after evaluation for unknown reason.   Remaining deficits: Unknown; Parkinson's Disease   Education / Equipment: Recommended for OP ST intervention   Patient agrees to discharge. Patient goals were not met. Patient is being discharged due to not returning since the last visit.Gracy Racer, CCC-SLP 12/02/2023, 2:18 PM  [facility] [address] [place], [place], [address] Phone: [phone]   Fax:  [phone]

## 2023-12-11 DIAGNOSIS — L821 Other seborrheic keratosis: Secondary | ICD-10-CM | POA: Diagnosis not present

## 2023-12-11 DIAGNOSIS — L570 Actinic keratosis: Secondary | ICD-10-CM | POA: Diagnosis not present

## 2023-12-11 DIAGNOSIS — L853 Xerosis cutis: Secondary | ICD-10-CM | POA: Diagnosis not present

## 2024-01-03 ENCOUNTER — Encounter: Payer: Self-pay | Admitting: Neurology

## 2024-01-17 ENCOUNTER — Other Ambulatory Visit: Payer: Self-pay | Admitting: Neurology

## 2024-01-17 DIAGNOSIS — G20B1 Parkinson's disease with dyskinesia, without mention of fluctuations: Secondary | ICD-10-CM

## 2024-01-21 ENCOUNTER — Telehealth: Payer: Self-pay

## 2024-01-21 NOTE — Telephone Encounter (Signed)
 This patient needs a new PA for his Entacapone 20 mg he takes 3 a day with his carbidopa levodopa

## 2024-01-22 ENCOUNTER — Telehealth: Payer: Self-pay | Admitting: Pharmacy Technician

## 2024-01-22 ENCOUNTER — Other Ambulatory Visit (HOSPITAL_COMMUNITY): Payer: Self-pay

## 2024-01-22 NOTE — Telephone Encounter (Signed)
 PA has been submitted, and telephone encounter has been created. Please see telephone encounter dated 4.9.25.  Pt last filled on 4.4.25.

## 2024-01-22 NOTE — Telephone Encounter (Signed)
 Pharmacy Patient Advocate Encounter   Received notification from Pt Calls Messages that prior authorization for ENTACAPONE 200MG  is required/requested.   Insurance verification completed.   The patient is insured through [facility] .   Per test claim: PA required; PA submitted to above mentioned insurance via CoverMyMeds Key/confirmation #/EOC [id number] Status is pending

## 2024-01-23 NOTE — Telephone Encounter (Signed)
 Pharmacy Patient Advocate Encounter  Received notification from [facility] that Prior Authorization for ENTACAPONE 200MG  has been CANCELLED due to   PA #/Case ID/Reference #: [id number]  PT LAST FILLED ON 4.4.25

## 2024-01-30 DIAGNOSIS — R0781 Pleurodynia: Secondary | ICD-10-CM | POA: Diagnosis not present

## 2024-02-03 ENCOUNTER — Other Ambulatory Visit (HOSPITAL_COMMUNITY): Payer: Self-pay

## 2024-02-03 ENCOUNTER — Telehealth: Payer: Self-pay | Admitting: Neurology

## 2024-02-03 NOTE — Telephone Encounter (Signed)
 Pt's wife called in and left a message with the after hours service. She says the pt has a regular walker but the PT recommended the pt use a U-Step walker. Would like an order for a U-Step walker.

## 2024-02-04 ENCOUNTER — Other Ambulatory Visit: Payer: Self-pay

## 2024-02-04 DIAGNOSIS — G20B2 Parkinson's disease with dyskinesia, with fluctuations: Secondary | ICD-10-CM

## 2024-02-04 NOTE — Telephone Encounter (Signed)
 Printed and in Dr. Winferd Hatter box

## 2024-02-05 NOTE — Telephone Encounter (Signed)
 DME has been mailed to the house and sent a ustep walker brochure with the RX

## 2024-02-05 NOTE — Telephone Encounter (Signed)
 Pt.s wife would like Walker sent to home via mail to address on file, pleae contact wife with details

## 2024-02-19 ENCOUNTER — Telehealth: Payer: Self-pay | Admitting: Cardiology

## 2024-02-19 NOTE — Telephone Encounter (Signed)
 Called and spoke to wife and advised her that he is heavily medicated from neurology for his Parkinson's disease. She states " he's not been out of the house in 2 weeks. He sleeps all the time, he's never been like this." Acute change in last 3 weeks. Had appt with neuro 4 weeks ago,but no changes to medication were made. He seems more out of breath. Wife says that his BP and HR have been good. He is overdue for appt with [facility], so added him to be seen first available 03/11/24. Encouraged wife to call neuro and speak with them as the SOB could be worsening Parkinson's or even a result of medication. Advised to monitor for any other signs of cardiac issues such as heavy sweating, facial wincing, complaints of acid reflux, or guarding a certain arm (particularly LUE). Verbalized understanding.

## 2024-02-19 NOTE — Telephone Encounter (Signed)
 Wife says patient has been fatigued and sleeping all the time. Wife says for the past 3 weeks patient has only remained awake for about 4 hours during the night, and during those hours awake he's fatigued. Wife says she has to wake him up to eat. While assumes one of his medications may need to be adjusted and she would like a call back to discuss.

## 2024-02-21 ENCOUNTER — Other Ambulatory Visit: Payer: Self-pay | Admitting: Cardiology

## 2024-02-26 ENCOUNTER — Other Ambulatory Visit: Payer: Self-pay | Admitting: Neurology

## 2024-02-26 DIAGNOSIS — G20B2 Parkinson's disease with dyskinesia, with fluctuations: Secondary | ICD-10-CM

## 2024-03-02 ENCOUNTER — Other Ambulatory Visit: Payer: Self-pay

## 2024-03-02 MED ORDER — ROSUVASTATIN CALCIUM 40 MG PO TABS: 40.0000 mg | ORAL_TABLET | Freq: Every day | ORAL | 0 refills | Status: DC

## 2024-03-11 ENCOUNTER — Ambulatory Visit: Attending: Cardiology | Admitting: Cardiology

## 2024-03-11 VITALS — BP 120/62 | HR 60 | Ht 72.0 in | Wt 195.0 lb

## 2024-03-11 DIAGNOSIS — I2583 Coronary atherosclerosis due to lipid rich plaque: Secondary | ICD-10-CM | POA: Diagnosis not present

## 2024-03-11 DIAGNOSIS — I2510 Atherosclerotic heart disease of native coronary artery without angina pectoris: Secondary | ICD-10-CM

## 2024-03-11 DIAGNOSIS — I5020 Unspecified systolic (congestive) heart failure: Secondary | ICD-10-CM | POA: Diagnosis not present

## 2024-03-11 DIAGNOSIS — G20B1 Parkinson's disease with dyskinesia, without mention of fluctuations: Secondary | ICD-10-CM

## 2024-03-11 NOTE — Patient Instructions (Addendum)
 Medication Instructions:  Please discontinue your Aspirin . Continue all other medications as listed.  *If you need a refill on your cardiac medications before your next appointment, please call your pharmacy*  Follow-Up: At [facility], you and your health needs are our priority.  As part of our continuing mission to provide you with exceptional heart care, our providers are all part of one team.  This team includes your primary Cardiologist (physician) and Advanced Practice Providers or APPs (Physician Assistants and Nurse Practitioners) who all work together to provide you with the care you need, when you need it.  Your next appointment:   1 year(s)  Provider:   Dorothye Gathers, MD    We recommend signing up for the patient portal called "MyChart".  Sign up information is provided on this After Visit Summary.  MyChart is used to connect with patients for Virtual Visits (Telemedicine).  Patients are able to view lab/test results, encounter notes, upcoming appointments, etc.  Non-urgent messages can be sent to your provider as well.   To learn more about what you can do with MyChart, go to [email or web address].   Glycine - start with 500 mg at bedtime.     USE YOUR WALKER!!!!

## 2024-03-11 NOTE — Progress Notes (Signed)
 Cardiology Office Note:  .   Date:  03/11/2024  ID:  [name], DOB 10/18/1945, MRN [id number] PCP: Benedetta Bradley, MD  [facility] Providers Cardiologist:  Dorothye Gathers, MD     History of Present Illness: .   [name] is a 78 y.o. male Discussed the use of AI scribe software for clinical note transcription with the patient, who gave verbal consent to proceed.  History of Present Illness [name] is a 79 year old male with Parkinson's disease and coronary artery disease who presents for a follow-up visit.  He has experienced increased fatigue, sleepiness, and balance issues, which have led to him not leaving the house in two weeks and sleeping most of the time, a change from his usual routine. A neurology appointment a month ago resulted in no changes to his medications.  He experiences occasional shortness of breath and feels more out of breath than usual. His heart rate has been in the forties and fifties, prompting a reduction and eventual discontinuation of metoprolol . He continues to take Plavix  and aspirin  for coronary artery disease, with a history of LAD and RCA stents placed after catheterization on February 02, 2023. An echocardiogram in 2024 showed an ejection fraction of 50-55%.  He has a history of a subconjunctival hemorrhage, for which he visited an ophthalmologist who determined there was no threat to his vision. He continues to take Plavix .  He is on multiple medications including Lasix  (furosemide ), Crestor  at 40 mg, Entresto  at 24/26 mg, and potassium supplements.   He has diabetes. His sleep is irregular, with difficulty sleeping at night and excessive sleepiness during the day, sometimes requiring waking up to eat due to diabetes management.       Studies Reviewed: .        Results DIAGNOSTIC EKG: Heart rate of 50, sinus bradycardia with incomplete right bundle branch block (03/11/2024) Cardiac catheterization: Disease requiring LAD and RCA  stent (02/02/2023) Echocardiogram: EF of 50-55%, normal valves (2024) Risk Assessment/Calculations:            Physical Exam:   VS:  BP 120/62 (BP Location: Right Arm, Patient Position: Sitting, Cuff Size: Normal)   Pulse 60   Ht 6' (1.829 m)   Wt 195 lb (88.5 kg)   BMI 26.45 kg/m    Wt Readings from Last 3 Encounters:  03/11/24 195 lb (88.5 kg)  11/13/23 191 lb (86.6 kg)  07/03/23 192 lb 8 oz (87.3 kg)    GEN: Well nourished, well developed in no acute distress NECK: No JVD; No carotid bruits CARDIAC: RRR, no murmurs, no rubs, no gallops RESPIRATORY:  Clear to auscultation without rales, wheezing or rhonchi  ABDOMEN: Soft, non-tender, non-distended EXTREMITIES:  No edema; No deformity   ASSESSMENT AND PLAN: .    Assessment and Plan Assessment & Plan Coronary artery disease with stents Coronary artery disease with previous LAD and RCA stents. Echocardiogram showed EF of 50%, improved from 40% in 2023. Heart rate improved after discontinuation of metoprolol . No current chest pain, but occasional dyspnea. Discussed discontinuing aspirin  and continuing with Plavix  alone for stent management. - Discontinue aspirin  and continue Plavix  for stent management - Continue Crestor  40 mg and Entresto  24/26 mg  Bradycardia Bradycardia with heart rate previously in the 40s and 50s. Metoprolol  was reduced and then discontinued, resulting in improved heart rate. No evidence of fluid overload. - Continue furosemide  and potassium for fluid balance  Parkinson's disease Parkinson's disease contributing to increased  fatigue, somnolence, and balance issues. Recent neurology appointment with no changes to medications. Discussed the potential impact on quality of life and the importance of exercise to manage symptoms. No new treatments available currently in the [place] , but discussed potential future treatments being researched in [place]. Neurologist advised against brain stimulator. 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  Follow-up Discussion of follow-up care and management. - Provide summary page to him - Encourage him to contact infectious disease specialist regarding continuation of metamind          Signed, Dorothye Gathers, MD

## 2024-03-13 ENCOUNTER — Telehealth: Payer: Self-pay | Admitting: Pharmacist

## 2024-03-13 NOTE — Telephone Encounter (Signed)
 Wife calling to see if patient should continue methenamine  or not since they have not followed with you in a while.  Nicklas Barns, PharmD, CPP, BCIDP, AAHIVP Clinical Pharmacist Practitioner Infectious Diseases Clinical Pharmacist [facility]

## 2024-03-13 NOTE — Telephone Encounter (Signed)
 Informed patient's wife appropriate to continue; she verbalized understanding.

## 2024-03-19 DIAGNOSIS — M170 Bilateral primary osteoarthritis of knee: Secondary | ICD-10-CM | POA: Diagnosis not present

## 2024-03-24 ENCOUNTER — Other Ambulatory Visit: Payer: Self-pay | Admitting: Cardiology

## 2024-03-30 DIAGNOSIS — L820 Inflamed seborrheic keratosis: Secondary | ICD-10-CM | POA: Diagnosis not present

## 2024-04-07 ENCOUNTER — Ambulatory Visit: Payer: Medicare Other | Admitting: Physical Therapy

## 2024-04-07 ENCOUNTER — Ambulatory Visit: Payer: Medicare Other | Admitting: Speech Pathology

## 2024-04-07 ENCOUNTER — Ambulatory Visit: Payer: Medicare Other | Admitting: Occupational Therapy

## 2024-04-21 ENCOUNTER — Other Ambulatory Visit: Payer: Self-pay | Admitting: Neurology

## 2024-04-21 DIAGNOSIS — G20B2 Parkinson's disease with dyskinesia, with fluctuations: Secondary | ICD-10-CM

## 2024-04-21 DIAGNOSIS — G20A1 Parkinson's disease without dyskinesia, without mention of fluctuations: Secondary | ICD-10-CM

## 2024-04-23 ENCOUNTER — Other Ambulatory Visit: Payer: Self-pay | Admitting: Neurology

## 2024-04-23 DIAGNOSIS — G20B1 Parkinson's disease with dyskinesia, without mention of fluctuations: Secondary | ICD-10-CM

## 2024-05-11 NOTE — Progress Notes (Deleted)
 Assessment/Plan:   1.  Parkinsons Disease, diagnosed 2010 with symptoms to 2008  -continue carbidopa /levodopa  25/100, 3 po 6 x per day.  Discussed with them that he certainly can spread it out differently than he currently is, but I do not want him increasing the dose.  I do not think he needs anymore.  He certainly looks well treated.  There is no evidence of off and I have never seen him off.  Discussed with him that he does have freezing, but that is generally not levodopa  responsive.  All the sx's that are levodopa  responsive are well treated.  -Continue entacapone  200 mg with first three dosages of levodopa    -For now, we will continue ropinirole  3 mg, 1/1/0.5  -We discussed Vyalev, which is foscarbidopa/foslevodopa pump that is newly FDA approved.  We discussed that it is for motor fluctuations in adults with advanced Parkinson's disease.  We discussed that this likely will not be on Medicare formulary until the latter half of 2025.  We discussed risks and benefits of this drug.  I am not sure if he needs the drug anyway but can address as it becomes more widely available.  He is not sure he is interested but he may be (he is concerned about size of the pump)  -his main issues with Parkinsons Disease continue to be hospitalizations associated with other illnesses, usually UTI, and he becomes weak and deconditioned and the Parkinson's symptoms get worse.  In addition, the patient is just noncompliant.  He checked himself out of inpatient rehab.  He did do some outpatient rehab, but they noted that patient declined use of  U step.     2.  History of UTI worsening parkinsonism  -Following with infectious disease closely  -follows with urology for nephrolithiasis  3.  Day/night reversal  -Long discussion again with patient regarding sleep hygiene.  Discussed with him that melatonin can help him to get back on a regular cycle, but that he really needs to work on staying active during the day,  with a regular schedule.  Discussed that day/night reversal really can lead to hallucinations, which ultimately can lead to discontinuation of ropinirole  and subsequent worsening of motor symptoms.  4.  Severe CAD  -Cardiac cath in March, 2024 with evidence of severe dual antiplatelet therapy with aspirin /Plavix   5.   Uncontrolled DM  - Following with endocrinology nurse practitioner at [facility] medical Subjective:   [name] was seen today in follow up for Parkinsons disease.  My previous records were reviewed prior to todays visit as well as outside records available to me.  Records are reviewed since last visit.  Patient continues to take his Parkinson's medications.  He has not been in the hospital since our last visit.  Last visit, his hemoglobin A1c was nearly 12 and he was going to start working with endocrinology.  Because many he of his other providers are outside of [facility] we have not seen those labs.  He goes to [facility] for this care and reports that ***.  He did recently see cardiology, but before seeing him they called cardiology in May stating that for the prior 3 weeks he had been tired and short of breath and that he had seen me 4 weeks prior (I had not seen the patient since January).  The cardiology nurse stated that his shortness of breath could be from worsening Parkinson's and that they should call me.  We did not get a  call from them, but he did end up seeing the cardiologist on May 28.  Cardiology concluded that his symptoms were from Parkinson's disease and that they should talk to me about adjusting his medications.  Current prescribed movement disorder medications: carbidopa /levodopa  25/100, 3 tablets 5 x per day Entacapone , 200 mg with first 3 dosages of levodopa   requip  3 mg, 1 tablet in the morning, 1 in the afternoon, half tablet in the evening  Inbrija    PREVIOUS MEDICATIONS: neupro (helped but costly); requip ; levodopa  IR; amantadine  (d/c when  confused)  ALLERGIES:   Allergies  Allergen Reactions   Nucynta [Tapentadol] Other (See Comments)    Made the patient not feel well. He did not feel well at all.   Oxycodone Itching   Sudafed [Pseudoephedrine Hcl] Other (See Comments)    Problems urinating    Tramadol  Hcl Itching and Other (See Comments)    Can tolerate, however (in 2023)    CURRENT MEDICATIONS:  Outpatient Encounter Medications as of 05/13/2024  Medication Sig   acetaminophen  (TYLENOL ) 325 MG tablet Take 2 tablets (650 mg total) by mouth every 6 (six) hours.   calcium  carbonate (TUMS) 500 MG chewable tablet Chew 2 tablets by mouth daily as needed for indigestion or heartburn.   carbidopa -levodopa  (SINEMET  IR) 25-100 MG tablet TAKE 3 TABLETS BY MOUTH AT 7:00 AM, 3 TABS AT 10:00 AM, 3 TABS AT 12:00 PM, 3 TABS AT 2:00 PM, 3 TABS AT 4:00, AND 3 TABS AT 6:00 PM   Cholecalciferol  (VITAMIN D ) 50 MCG (2000 UT) tablet Take 2,000 Units by mouth daily.   clopidogrel  (PLAVIX ) 75 MG tablet TAKE 1 TABLET (75 MG TOTAL) BY MOUTH DAILY WITH BREAKFAST   Continuous Glucose Sensor (DEXCOM G7 SENSOR) MISC 1 each by Does not apply route as needed. (Patient not taking: Reported on 03/11/2024)   entacapone  (COMTAN ) 200 MG tablet TAKE 1 TABLET BY MOUTH 3 TIMES DAILY (WITH FIRST THREE DOSAGES OF CARBIDOPA /LEVODOPA ).   furosemide  (LASIX ) 20 MG tablet Take 1 tablet (20 mg total) by mouth 2 (two) times daily.   GLOBAL EASE INJECT PEN NEEDLES 31G X 5 MM MISC Inject into the skin.   insulin  glargine-yfgn (SEMGLEE ) 100 UNIT/ML injection Inject 0.08 mLs (8 Units total) into the skin at bedtime.   insulin  lispro (HUMALOG ) 100 UNIT/ML KwikPen Inject 2-15 Units into the skin 4 (four) times daily -  before meals and at bedtime. CBG 121 - 150: 2 units, CBG 151 - 200: 3 units, CBG 201 - 250: 5 units, CBG 251 - 300: 8 units, CBG 301 - 350: 11 units, CBG 351 - 400: 15 units (Patient taking differently: Inject 2-3 Units into the skin See admin instructions.  Inject 2-3 units into the skin three times a day before meals, per sliding scale)   lidocaine  (LIDODERM ) 5 % Place 1 patch onto the skin daily. Remove & Discard patch within 12 hours or as directed by MD   methenamine  (HIPREX ) 1 g tablet TAKE 1 TABLET BY MOUTH 2 TIMES DAILY. TAKE WITH ANY VITAMIN C , TWICE A DAY FOR UTI PREVENTION (Patient taking differently: Take 0.5 g by mouth 4 (four) times daily.)   nitroGLYCERIN  (NITROSTAT ) 0.4 MG SL tablet Place 1 tablet (0.4 mg total) under the tongue every 5 (five) minutes as needed. (Patient taking differently: Place 0.4 mg under the tongue every 5 (five) minutes as needed for chest pain.)   ondansetron  (ZOFRAN ) 4 MG tablet Take 1 tablet (4 mg total) by mouth every 6 (six)  hours as needed for nausea.   pantoprazole  (PROTONIX ) 40 MG tablet TAKE 1 TABLET (40 MG TOTAL) BY MOUTH DAILY   potassium chloride  (KLOR-CON ) 10 MEQ tablet Take 10 mEq by mouth 2 (two) times daily.   ramelteon (ROZEREM) 8 MG tablet Take 8 mg by mouth at bedtime.   rOPINIRole  (REQUIP ) 3 MG tablet TAKE 1 TABLET IN THE MORNING, TAKE 1 TABLET IN THE AFTERNOON, AND 1/2 TABLET IN THE EVENING   rosuvastatin  (CRESTOR ) 40 MG tablet TAKE 1 TABLET (40 MG TOTAL) BY MOUTH DAILY. KEEP APPOINTMENT   sacubitril -valsartan  (ENTRESTO ) 24-26 MG TAKE 1 TABLET BY MOUTH TWICE DAILY.   sertraline  (ZOLOFT ) 50 MG tablet Take 50 mg by mouth daily.   tamsulosin  (FLOMAX ) 0.4 MG CAPS capsule Take 0.4 mg by mouth in the morning and at bedtime.   triamcinolone  (NASACORT ) 55 MCG/ACT AERO nasal inhaler Place 2 sprays into the nose daily as needed (for allergies or rhinitis).   vitamin B-12 (CYANOCOBALAMIN ) 1000 MCG tablet Take 2,000 mcg by mouth daily.   No facility-administered encounter medications on file as of 05/13/2024.    Objective:   PHYSICAL EXAMINATION:    VITALS:   There were no vitals filed for this visit.    Wt Readings from Last 3 Encounters:  03/11/24 195 lb (88.5 kg)  11/13/23 191 lb (86.6 kg)   07/03/23 192 lb 8 oz (87.3 kg)    GEN:  The patient appears stated age and is in NAD. HEENT:  Normocephalic, atraumatic.  The mucous membranes are moist. The superficial temporal arteries are without ropiness or tenderness.  Neurological examination:  Orientation: The patient is alert and oriented x3. Cranial nerves: There is good facial symmetry with min facial hypomimia. The speech is fluent and clear. Soft palate rises symmetrically and there is no tongue deviation. Hearing is intact to conversational tone. Sensation: Sensation is intact to light touch throughout Motor: Strength is at least antigravity x4.  Movement examination: Tone: There is nl tone in the UE/LE. Abnormal movements: mild dyskinesia of the R leg (similar to last visit) Coordination:  There is no significant decremation with any rapid alternating movement. Gait and Station: The patient pushes off of the chair to arise.  As with previous visits, he doesn't have his walker but does have his cane.  He does well in the hall but freezes in the door.    I have reviewed and interpreted the following labs independently    Chemistry      Component Value Date/Time   NA 139 07/07/2023 0637   NA 136 01/02/2023 1442   K 4.0 07/07/2023 0637   CL 103 07/07/2023 0637   CO2 29 07/07/2023 0637   BUN 20 07/07/2023 0637   BUN 22 01/02/2023 1442   CREATININE 1.07 07/07/2023 0637   CREATININE 1.29 (H) 08/21/2022 1558      Component Value Date/Time   CALCIUM  8.8 (L) 07/07/2023 0637   ALKPHOS 84 07/07/2023 0637   AST <5 (L) 07/07/2023 0637   ALT 6 07/07/2023 0637   BILITOT 0.7 07/07/2023 0637       Lab Results  Component Value Date   WBC 5.0 07/07/2023   HGB 13.8 07/07/2023   HCT 42.9 07/07/2023   MCV 93.1 07/07/2023   PLT 216 07/07/2023    Lab Results  Component Value Date   TSH 2.126 06/24/2023    Lab Results  Component Value Date   HGBA1C 11.7 (H) 04/02/2023     Total time spent on today's  visit was  *** minutes, including both face-to-face time and nonface-to-face time.  Time included that spent on review of records (prior notes available to me/labs/imaging if pertinent), discussing treatment and goals, answering patient's questions and coordinating care.  Cc:  Charlott Dorn LABOR, MD

## 2024-05-13 ENCOUNTER — Ambulatory Visit: Payer: Medicare Other | Admitting: Neurology

## 2024-05-25 ENCOUNTER — Other Ambulatory Visit: Payer: Self-pay | Admitting: Neurology

## 2024-05-25 DIAGNOSIS — G20B2 Parkinson's disease with dyskinesia, with fluctuations: Secondary | ICD-10-CM

## 2024-05-25 DIAGNOSIS — R310 Gross hematuria: Secondary | ICD-10-CM | POA: Diagnosis not present

## 2024-06-02 DIAGNOSIS — D4101 Neoplasm of uncertain behavior of right kidney: Secondary | ICD-10-CM | POA: Diagnosis not present

## 2024-06-19 DIAGNOSIS — N3020 Other chronic cystitis without hematuria: Secondary | ICD-10-CM | POA: Diagnosis not present

## 2024-06-19 DIAGNOSIS — D4101 Neoplasm of uncertain behavior of right kidney: Secondary | ICD-10-CM | POA: Diagnosis not present

## 2024-06-19 DIAGNOSIS — N200 Calculus of kidney: Secondary | ICD-10-CM | POA: Diagnosis not present

## 2024-06-19 DIAGNOSIS — R3915 Urgency of urination: Secondary | ICD-10-CM | POA: Diagnosis not present

## 2024-06-19 DIAGNOSIS — R3912 Poor urinary stream: Secondary | ICD-10-CM | POA: Diagnosis not present

## 2024-06-22 ENCOUNTER — Other Ambulatory Visit: Payer: Self-pay | Admitting: Internal Medicine

## 2024-07-08 ENCOUNTER — Other Ambulatory Visit: Payer: Self-pay | Admitting: Cardiology

## 2024-07-09 DIAGNOSIS — E1165 Type 2 diabetes mellitus with hyperglycemia: Secondary | ICD-10-CM | POA: Diagnosis not present

## 2024-07-09 DIAGNOSIS — I5041 Acute combined systolic (congestive) and diastolic (congestive) heart failure: Secondary | ICD-10-CM | POA: Diagnosis not present

## 2024-07-09 DIAGNOSIS — I10 Essential (primary) hypertension: Secondary | ICD-10-CM | POA: Diagnosis not present

## 2024-07-09 DIAGNOSIS — E785 Hyperlipidemia, unspecified: Secondary | ICD-10-CM | POA: Diagnosis not present

## 2024-07-09 DIAGNOSIS — I2510 Atherosclerotic heart disease of native coronary artery without angina pectoris: Secondary | ICD-10-CM | POA: Diagnosis not present

## 2024-07-11 ENCOUNTER — Other Ambulatory Visit: Payer: Self-pay | Admitting: Cardiology

## 2024-08-03 ENCOUNTER — Other Ambulatory Visit: Payer: Self-pay | Admitting: Neurology

## 2024-08-03 DIAGNOSIS — G20B1 Parkinson's disease with dyskinesia, without mention of fluctuations: Secondary | ICD-10-CM

## 2024-08-04 NOTE — Progress Notes (Unsigned)
 Assessment/Plan:   1. Parkinsons Disease, diagnosed 2010 with symptoms to 2008  -c/o wearing off at end of day and long discussion about options.  Discussed vyalev in detail and shown pump and answered questions.  Pt declined  -they asked about increasing levodopa  but I don't think that this would help.  He is already taking 15-18 per day.  Discussed with them that he certainly can spread it out differently than he currently is (taking 1 q hour in the afternoon instead of 3 tabs q 3 hours) but its difficult to take this way and they didn't think that this would be possible.  -stop carbidopa /levodopa  25/100, 3 po 5-6 times per day  -start crexont , 350 mg, 2 po tid.  Samples provided.  He is to call me and let me know how he does             -Continue entacapone  200 mg with first three dosages of levodopa               -For now, we will continue ropinirole  3 mg, 1/1/0.5             -We discussed onapgo but don't think risk:benefit ratio is in his favor  -biggest issue is balance and told him meds don't really help with this.  Discussed exercise/hiring trainer for yoga/tai chi/balance program but he said he was too busy/schedule didn't all.  Doesn't use walker outside of home             -his main issues with Parkinsons Disease continue to be hospitalizations associated with other illnesses, usually UTI, and he becomes weak and deconditioned and the Parkinson's symptoms get worse.     2.  History of UTI worsening parkinsonism             -Following with infectious disease closely             -follows with urology for nephrolithiasis   3.  insomnia             -failed trazodone  due to hangover  -trial mirtazapine  15 mg q hs but wait for 2 weeks after starting crexont .     4.  Severe CAD             -Cardiac cath in March, 2024 with evidence of severe dual antiplatelet therapy with aspirin /Plavix    5.   Uncontrolled DM             - Following with endocrinology nurse practitioner at [facility]  medical Subjective:   [name] was seen today in follow up for Parkinsons disease.  My previous records were reviewed prior to todays visit as well as outside records available to me.  Records are reviewed since last visit.  Patient continues to take his Parkinson's medications.  He has not been in the hospital since our last visit.  Last visit, his hemoglobin A1c was nearly 12 and he was going to start working with endocrinology.  Because many he of his other providers are outside of [facility] health we have not seen those labs.   He did see cardiology in May, but before seeing him they called cardiology stating that for the prior 3 weeks he had been tired and short of breath and that he had seen me 4 weeks prior (I had not seen the patient since January).  The cardiology nurse stated that his shortness of breath could be from worsening Parkinson's and that they should call me.  We did not get a call from them, but he did end up seeing the cardiologist on May 28.  Cardiology concluded that his symptoms were from Parkinson's disease and that they should talk to me about adjusting his medications.  Patient did have an appointment here in July, but ended up canceling that and moving it to today.  Wife reports lots of falls - it seems like the dopamine wears off by the afternoon.  He feels that it doesn't work well from 1 pm on - if he takes it, you can't tell.    Current prescribed movement disorder medications: carbidopa /levodopa  25/100, 3 tablets 5 x per day Entacapone , 200 mg with first 3 dosages of levodopa   requip  3 mg, 1 tablet in the morning, 1 in the afternoon, half tablet in the evening  Inbrija    PREVIOUS MEDICATIONS: neupro (helped but costly); requip ; levodopa  IR; amantadine  (d/c when confused)  ALLERGIES:   Allergies  Allergen Reactions   Nucynta [Tapentadol] Other (See Comments)    Made the patient not feel well. He did not feel well at all.   Oxycodone Itching   Sudafed  [Pseudoephedrine Hcl] Other (See Comments)    Problems urinating    Tramadol  Hcl Itching and Other (See Comments)    Can tolerate, however (in 2023)    CURRENT MEDICATIONS:  Outpatient Encounter Medications as of 08/06/2024  Medication Sig   acetaminophen  (TYLENOL ) 325 MG tablet Take 2 tablets (650 mg total) by mouth every 6 (six) hours.   calcium  carbonate (TUMS) 500 MG chewable tablet Chew 2 tablets by mouth daily as needed for indigestion or heartburn.   carbidopa -levodopa  (SINEMET  IR) 25-100 MG tablet TAKE 3 TABLETS BY MOUTH AT 7:00 AM, 3 TABS AT 10:00 AM, 3 TABS AT 12:00 PM, 3 TABS AT 2:00 PM, 3 TABS AT 4:00, AND 3 TABS AT 6:00 PM   Cholecalciferol  (VITAMIN D ) 50 MCG (2000 UT) tablet Take 2,000 Units by mouth daily.   clopidogrel  (PLAVIX ) 75 MG tablet TAKE 1 TABLET (75 MG TOTAL) BY MOUTH DAILY WITH BREAKFAST   Continuous Glucose Sensor (DEXCOM G7 SENSOR) MISC 1 each by Does not apply route as needed.   entacapone  (COMTAN ) 200 MG tablet TAKE 1 TABLET BY MOUTH 3 TIMES DAILY (WITH FIRST THREE DOSAGES OF CARBIDOPA /LEVODOPA ).   furosemide  (LASIX ) 20 MG tablet Take 1 tablet (20 mg total) by mouth 2 (two) times daily.   GLOBAL EASE INJECT PEN NEEDLES 31G X 5 MM MISC Inject into the skin.   insulin  glargine-yfgn (SEMGLEE ) 100 UNIT/ML injection Inject 0.08 mLs (8 Units total) into the skin at bedtime.   insulin  lispro (HUMALOG ) 100 UNIT/ML KwikPen Inject 2-15 Units into the skin 4 (four) times daily -  before meals and at bedtime. CBG 121 - 150: 2 units, CBG 151 - 200: 3 units, CBG 201 - 250: 5 units, CBG 251 - 300: 8 units, CBG 301 - 350: 11 units, CBG 351 - 400: 15 units (Patient taking differently: Inject 2-3 Units into the skin See admin instructions. Inject 2-3 units into the skin three times a day before meals, per sliding scale)   lidocaine  (LIDODERM ) 5 % Place 1 patch onto the skin daily. Remove & Discard patch within 12 hours or as directed by MD   methenamine  (HIPREX ) 1 g tablet TAKE 1  TABLET BY MOUTH 2 TIMES DAILY. TAKE WITH ANY VITAMIN C , TWICE A DAY FOR UTI PREVENTION   nitroGLYCERIN  (NITROSTAT ) 0.4 MG SL tablet Place 1 tablet (0.4 mg total)  under the tongue every 5 (five) minutes as needed. (Patient taking differently: Place 0.4 mg under the tongue every 5 (five) minutes as needed for chest pain.)   ondansetron  (ZOFRAN ) 4 MG tablet Take 1 tablet (4 mg total) by mouth every 6 (six) hours as needed for nausea.   pantoprazole  (PROTONIX ) 40 MG tablet TAKE 1 TABLET (40 MG TOTAL) BY MOUTH DAILY   potassium chloride  (KLOR-CON ) 10 MEQ tablet Take 10 mEq by mouth 2 (two) times daily.   ramelteon (ROZEREM) 8 MG tablet Take 8 mg by mouth at bedtime.   rOPINIRole  (REQUIP ) 3 MG tablet TAKE 1 TABLET IN THE MORNING, TAKE 1 TABLET IN THE AFTERNOON, AND 1/2 TABLET IN THE EVENING   rosuvastatin  (CRESTOR ) 40 MG tablet TAKE 1 TABLET (40 MG TOTAL) BY MOUTH DAILY. KEEP APPOINTMENT   sacubitril -valsartan  (ENTRESTO ) 24-26 MG TAKE 1 TABLET BY MOUTH TWICE DAILY.   sertraline  (ZOLOFT ) 50 MG tablet Take 50 mg by mouth daily.   tamsulosin  (FLOMAX ) 0.4 MG CAPS capsule Take 0.4 mg by mouth in the morning and at bedtime.   triamcinolone  (NASACORT ) 55 MCG/ACT AERO nasal inhaler Place 2 sprays into the nose daily as needed (for allergies or rhinitis).   vitamin B-12 (CYANOCOBALAMIN ) 1000 MCG tablet Take 2,000 mcg by mouth daily.   No facility-administered encounter medications on file as of 08/06/2024.    Objective:   PHYSICAL EXAMINATION:    VITALS:   Vitals:   08/06/24 1110  BP: 126/74  Pulse: 72  SpO2: 98%  Weight: 190 lb (86.2 kg)      Wt Readings from Last 3 Encounters:  08/06/24 190 lb (86.2 kg)  03/11/24 195 lb (88.5 kg)  11/13/23 191 lb (86.6 kg)    GEN:  The patient appears stated age and is in NAD. HEENT:  Normocephalic, atraumatic.  The mucous membranes are moist. The superficial temporal arteries are without ropiness or tenderness. CV:  RRR Lungs:  CTAB Neck:  no  bruits  Neurological examination:  Orientation: The patient is alert and oriented x3. Cranial nerves: There is good facial symmetry with min facial hypomimia. The speech is fluent and clear. Soft palate rises symmetrically and there is no tongue deviation. Hearing is intact to conversational tone. Sensation: Sensation is intact to light touch throughout Motor: Strength is at least antigravity x4.  Movement examination: Tone: There is nl tone in the UE/LE. Abnormal movements: mild dyskinesia of the R leg (similar to last visit) and L shoulder Coordination:  There is no significant decremation with any rapid alternating movement. Gait and Station: The patient pushes off of the chair to arise.  As with previous visits, he doesn't have his walker but does have his cane.  He has a bit of start hesitation.  He does well in the hall but turns en bloc  I have reviewed and interpreted the following labs independently    Chemistry      Component Value Date/Time   NA 139 07/07/2023 0637   NA 136 01/02/2023 1442   K 4.0 07/07/2023 0637   CL 103 07/07/2023 0637   CO2 29 07/07/2023 0637   BUN 20 07/07/2023 0637   BUN 22 01/02/2023 1442   CREATININE 1.07 07/07/2023 0637   CREATININE 1.29 (H) 08/21/2022 1558      Component Value Date/Time   CALCIUM  8.8 (L) 07/07/2023 0637   ALKPHOS 84 07/07/2023 0637   AST <5 (L) 07/07/2023 0637   ALT 6 07/07/2023 0637   BILITOT 0.7 07/07/2023 9362  Lab Results  Component Value Date   WBC 5.0 07/07/2023   HGB 13.8 07/07/2023   HCT 42.9 07/07/2023   MCV 93.1 07/07/2023   PLT 216 07/07/2023    Lab Results  Component Value Date   TSH 2.126 06/24/2023    Lab Results  Component Value Date   HGBA1C 11.7 (H) 04/02/2023     Total time spent on today's visit was 47 minutes, including both face-to-face time and nonface-to-face time.  Time included that spent on review of records (prior notes available to me/labs/imaging if pertinent),  discussing treatment and goals, answering patient's questions and coordinating care.  Cc:  Charlott Dorn LABOR, MD

## 2024-08-06 ENCOUNTER — Ambulatory Visit: Admitting: Neurology

## 2024-08-06 ENCOUNTER — Encounter: Payer: Self-pay | Admitting: Neurology

## 2024-08-06 VITALS — BP 126/74 | HR 72 | Wt 190.0 lb

## 2024-08-06 DIAGNOSIS — G20B2 Parkinson's disease with dyskinesia, with fluctuations: Secondary | ICD-10-CM | POA: Diagnosis not present

## 2024-08-06 DIAGNOSIS — G4701 Insomnia due to medical condition: Secondary | ICD-10-CM

## 2024-08-06 MED ORDER — CREXONT 87.5-350 MG PO CPCR: 2.0000 | ORAL_CAPSULE | Freq: Three times a day (TID) | ORAL | Status: DC

## 2024-08-06 MED ORDER — MIRTAZAPINE 15 MG PO TABS: 15.0000 mg | ORAL_TABLET | Freq: Every day | ORAL | 1 refills | Status: AC

## 2024-08-06 NOTE — Progress Notes (Signed)
 Medication Samples have been provided to the patient.  Drug name: Crexont        Strength: 250 mg mg        Qty: 2  LOT: [id number]  Exp.Date: 6/26  Dosing instructions: 2 cap TID  The patient has been instructed regarding the correct time, dose, and frequency of taking this medication, including desired effects and most common side effects.   [name] 11:59 AM 08/06/2024

## 2024-08-06 NOTE — Patient Instructions (Addendum)
 Stop carbidopa /levodopa  25/100 (the yellow pill you have at home) START crexont  350 mg, 2 capsules 3 times per day You can still take the entacapone  with the crexont  capsules at the same time 2 weeks after you trial the above, you can trial mirtazapine  for your sleep and let me know how you do.  Don't start it at the same time you start the crexont .

## 2024-08-17 ENCOUNTER — Other Ambulatory Visit (HOSPITAL_BASED_OUTPATIENT_CLINIC_OR_DEPARTMENT_OTHER): Payer: Self-pay | Admitting: Cardiology

## 2024-08-18 ENCOUNTER — Telehealth: Payer: Self-pay | Admitting: Neurology

## 2024-08-18 ENCOUNTER — Other Ambulatory Visit: Payer: Self-pay

## 2024-08-18 DIAGNOSIS — G20B2 Parkinson's disease with dyskinesia, with fluctuations: Secondary | ICD-10-CM

## 2024-08-18 NOTE — Telephone Encounter (Signed)
 Printed and on Dr. Martie desk for [employer]

## 2024-08-18 NOTE — Telephone Encounter (Signed)
 Pts wife would like a new order for DME four wheel walker with seat placed as Pt is now asking for it

## 2024-08-24 ENCOUNTER — Other Ambulatory Visit: Payer: Self-pay | Admitting: Neurology

## 2024-08-24 ENCOUNTER — Other Ambulatory Visit: Payer: Self-pay

## 2024-08-24 DIAGNOSIS — G20B2 Parkinson's disease with dyskinesia, with fluctuations: Secondary | ICD-10-CM

## 2024-08-24 MED ORDER — ROPINIROLE HCL 3 MG PO TABS: ORAL_TABLET | ORAL | 0 refills | Status: AC

## 2024-08-25 ENCOUNTER — Telehealth: Payer: Self-pay | Admitting: Neurology

## 2024-08-25 ENCOUNTER — Other Ambulatory Visit: Payer: Self-pay

## 2024-08-25 DIAGNOSIS — G20B2 Parkinson's disease with dyskinesia, with fluctuations: Secondary | ICD-10-CM

## 2024-08-25 MED ORDER — CARBIDOPA-LEVODOPA 25-100 MG PO TABS: ORAL_TABLET | ORAL | 1 refills | Status: DC

## 2024-08-25 MED ORDER — CARBIDOPA-LEVODOPA 25-100 MG PO TABS: ORAL_TABLET | ORAL | 1 refills | Status: AC

## 2024-08-25 MED ORDER — CREXONT 87.5-350 MG PO CPCR: 2.0000 | ORAL_CAPSULE | Freq: Three times a day (TID) | ORAL | 0 refills | Status: DC

## 2024-08-25 NOTE — Telephone Encounter (Signed)
 Pt. Needs refill, pharmacy denied request

## 2024-08-25 NOTE — Telephone Encounter (Signed)
 Patient wife would like to get the Carbidopa  levodopa   and the ropinirole  called into the [facility].

## 2024-08-25 NOTE — Progress Notes (Signed)
 [name]

## 2024-09-01 ENCOUNTER — Telehealth: Payer: Self-pay | Admitting: Neurology

## 2024-09-01 NOTE — Telephone Encounter (Signed)
 PT. Asking where is she to get the walker that was ordered by Dr. Evonnie, referral has been mailed but Pt and wife do not know where to get the walker from, please call back with next steps

## 2024-09-04 NOTE — Telephone Encounter (Signed)
 Patient wife called and wants to talk to someone about the U step walker and where to get it at

## 2024-09-07 NOTE — Telephone Encounter (Signed)
 Called patients wife back and let her know she can 1 go to a DME supply store , 2 order from [employer] or 3 call me back and I can send referral into [facility] but I did let her know that 3 is the longest of the options. She said she needs it right away.  Patient had switched from IR to CR patient was worse on this carbidopa  levodopa  and patients wife went back to IR. Patient has fallen 7 times in 2 days and patietns wife is concerned

## 2024-10-01 ENCOUNTER — Telehealth: Payer: Self-pay | Admitting: Neurology

## 2024-10-01 NOTE — Progress Notes (Unsigned)
 Assessment/Plan:   1. Parkinsons Disease, diagnosed 2010 with symptoms to 2008  -***  -they asked about increasing levodopa  but I don't think that this would help.  He is already taking 15-18 per day.  Discussed with them that he certainly can spread it out differently than he currently is (taking 1 q hour in the afternoon instead of 3 tabs q 3 hours) but its difficult to take this way and they didn't think that this would be possible.  -***             -Continue entacapone  200 mg with first three dosages of levodopa               -For now, we will continue ropinirole  3 mg, 1/1/0.5             -We discussed onapgo but don't think risk:benefit ratio is in his favor  - Patient has 2 primary issues - one of which is when he gets sick and has illness, primarily UTI, and he becomes weak and deconditioned and then has a significant number of falls.  The other is that he will not use a walker outside of the home.   2.  History of UTI worsening parkinsonism             -Following with infectious disease closely             -follows with urology for nephrolithiasis   3.  insomnia             -failed trazodone  due to hangover  -trial mirtazapine  15 mg q hs but wait for 2 weeks after starting crexont .     4.  Severe CAD             -Cardiac cath in March, 2024 with evidence of severe dual antiplatelet therapy with aspirin /Plavix    5.   Uncontrolled DM             - Following with endocrinology nurse practitioner at [facility] medical Subjective:   [name] was seen today in follow up for Parkinsons disease.  My previous records were reviewed prior to todays visit as well as outside records available to me.  Records are reviewed since last visit.  Patient here today for levodopa  challenge test.  I saw the patient about 5 weeks ago at which point patient and wife had reported that he was not interested in [place].  We started him on Crexont  but ultimately he did not find it effective and went back to  his previous regimen.  They called almost immediately after the last visit stating that they wanted to trial Vyalev, but because they had previously told me that the levodopa  was not working, he presents today for levodopa  challenge.  Current prescribed movement disorder medications: carbidopa /levodopa  25/100, 3 tablets 5 x per day Entacapone , 200 mg with first 3 dosages of levodopa   requip  3 mg, 1 tablet in the morning, 1 in the afternoon, half tablet in the evening  Inbrija    PREVIOUS MEDICATIONS: neupro (helped but costly); requip ; levodopa  IR; amantadine  (d/c when confused); Crexont  350 mg (2 tablets 3 times per day)  ALLERGIES:   Allergies  Allergen Reactions   Nucynta [Tapentadol] Other (See Comments)    Made the patient not feel well. He did not feel well at all.   Oxycodone Itching   Sudafed [Pseudoephedrine Hcl] Other (See Comments)    Problems urinating    Tramadol  Hcl Itching and Other (See Comments)  Can tolerate, however (in 2023)    CURRENT MEDICATIONS:  Outpatient Encounter Medications as of 10/02/2024  Medication Sig   acetaminophen  (TYLENOL ) 325 MG tablet Take 2 tablets (650 mg total) by mouth every 6 (six) hours.   calcium  carbonate (TUMS) 500 MG chewable tablet Chew 2 tablets by mouth daily as needed for indigestion or heartburn.   carbidopa -levodopa  (SINEMET  IR) 25-100 MG tablet TAKE 3 TABLETS BY MOUTH AT 7:00 AM, 3 TABS AT 10:00 AM, 3 TABS AT 12:00 PM, 3 TABS AT 2:00 PM, 3 TABS AT 4:00, AND 3 TABS AT 6:00 PM   Cholecalciferol  (VITAMIN D ) 50 MCG (2000 UT) tablet Take 2,000 Units by mouth daily.   clopidogrel  (PLAVIX ) 75 MG tablet TAKE 1 TABLET (75 MG TOTAL) BY MOUTH DAILY WITH BREAKFAST   Continuous Glucose Sensor (DEXCOM G7 SENSOR) MISC 1 each by Does not apply route as needed.   entacapone  (COMTAN ) 200 MG tablet TAKE 1 TABLET BY MOUTH 3 TIMES DAILY (WITH FIRST THREE DOSAGES OF CARBIDOPA /LEVODOPA ).   furosemide  (LASIX ) 20 MG tablet Take 1 tablet (20 mg total)  by mouth 2 (two) times daily.   GLOBAL EASE INJECT PEN NEEDLES 31G X 5 MM MISC Inject into the skin.   insulin  glargine-yfgn (SEMGLEE ) 100 UNIT/ML injection Inject 0.08 mLs (8 Units total) into the skin at bedtime.   insulin  lispro (HUMALOG ) 100 UNIT/ML KwikPen Inject 2-15 Units into the skin 4 (four) times daily -  before meals and at bedtime. CBG 121 - 150: 2 units, CBG 151 - 200: 3 units, CBG 201 - 250: 5 units, CBG 251 - 300: 8 units, CBG 301 - 350: 11 units, CBG 351 - 400: 15 units (Patient taking differently: Inject 2-3 Units into the skin See admin instructions. Inject 2-3 units into the skin three times a day before meals, per sliding scale)   lidocaine  (LIDODERM ) 5 % Place 1 patch onto the skin daily. Remove & Discard patch within 12 hours or as directed by MD   methenamine  (HIPREX ) 1 g tablet TAKE 1 TABLET BY MOUTH 2 TIMES DAILY. TAKE WITH ANY VITAMIN C , TWICE A DAY FOR UTI PREVENTION   mirtazapine  (REMERON ) 15 MG tablet Take 1 tablet (15 mg total) by mouth at bedtime.   nitroGLYCERIN  (NITROSTAT ) 0.4 MG SL tablet Place 1 tablet (0.4 mg total) under the tongue every 5 (five) minutes as needed. (Patient taking differently: Place 0.4 mg under the tongue every 5 (five) minutes as needed for chest pain.)   ondansetron  (ZOFRAN ) 4 MG tablet Take 1 tablet (4 mg total) by mouth every 6 (six) hours as needed for nausea.   pantoprazole  (PROTONIX ) 40 MG tablet TAKE 1 TABLET (40 MG TOTAL) BY MOUTH DAILY   potassium chloride  (KLOR-CON ) 10 MEQ tablet Take 10 mEq by mouth 2 (two) times daily.   ramelteon (ROZEREM) 8 MG tablet Take 8 mg by mouth at bedtime.   rOPINIRole  (REQUIP ) 3 MG tablet TAKE 1 TABLET IN THE MORNING, TAKE 1 TABLET IN THE AFTERNOON, AND 1/2 TABLET IN THE EVENING   rosuvastatin  (CRESTOR ) 40 MG tablet TAKE 1 TABLET (40 MG TOTAL) BY MOUTH DAILY. KEEP APPOINTMENT   sacubitril -valsartan  (ENTRESTO ) 24-26 MG TAKE 1 TABLET BY MOUTH 2 TIMES DAILY.   sertraline  (ZOLOFT ) 50 MG tablet Take 50 mg by  mouth daily.   tamsulosin  (FLOMAX ) 0.4 MG CAPS capsule Take 0.4 mg by mouth in the morning and at bedtime.   triamcinolone  (NASACORT ) 55 MCG/ACT AERO nasal inhaler Place 2 sprays into the  nose daily as needed (for allergies or rhinitis).   vitamin B-12 (CYANOCOBALAMIN ) 1000 MCG tablet Take 2,000 mcg by mouth daily.   No facility-administered encounter medications on file as of 10/02/2024.    Objective:   PHYSICAL EXAMINATION:    VITALS:   There were no vitals filed for this visit.     Wt Readings from Last 3 Encounters:  08/06/24 190 lb (86.2 kg)  03/11/24 195 lb (88.5 kg)  11/13/23 191 lb (86.6 kg)    GEN:  The patient appears stated age and is in NAD. HEENT:  Normocephalic, atraumatic.  The mucous membranes are moist. The superficial temporal arteries are without ropiness or tenderness. CV:  RRR Lungs:  CTAB Neck:  no bruits  Neurological examination:  Orientation: The patient is alert and oriented x3. Cranial nerves: There is good facial symmetry with min facial hypomimia. The speech is fluent and clear. Soft palate rises symmetrically and there is no tongue deviation. Hearing is intact to conversational tone. Sensation: Sensation is intact to light touch throughout Motor: Strength is at least antigravity x4.  Movement examination: Tone: There is nl tone in the UE/LE. Abnormal movements: mild dyskinesia of the R leg (similar to last visit) and L shoulder Coordination:  There is no significant decremation with any rapid alternating movement. Gait and Station: The patient pushes off of the chair to arise.  As with previous visits, he doesn't have his walker but does have his cane.  He has a bit of start hesitation.  He does well in the hall but turns en bloc  I have reviewed and interpreted the following labs independently    Chemistry      Component Value Date/Time   NA 139 07/07/2023 0637   NA 136 01/02/2023 1442   K 4.0 07/07/2023 0637   CL 103 07/07/2023 0637    CO2 29 07/07/2023 0637   BUN 20 07/07/2023 0637   BUN 22 01/02/2023 1442   CREATININE 1.07 07/07/2023 0637   CREATININE 1.29 (H) 08/21/2022 1558      Component Value Date/Time   CALCIUM  8.8 (L) 07/07/2023 0637   ALKPHOS 84 07/07/2023 0637   AST <5 (L) 07/07/2023 0637   ALT 6 07/07/2023 0637   BILITOT 0.7 07/07/2023 0637       Lab Results  Component Value Date   WBC 5.0 07/07/2023   HGB 13.8 07/07/2023   HCT 42.9 07/07/2023   MCV 93.1 07/07/2023   PLT 216 07/07/2023    Lab Results  Component Value Date   TSH 2.126 06/24/2023    Lab Results  Component Value Date   HGBA1C 11.7 (H) 04/02/2023     Total time spent on today's visit was *** minutes, including both face-to-face time and nonface-to-face time.  Time included that spent on review of records (prior notes available to me/labs/imaging if pertinent), discussing treatment and goals, answering patient's questions and coordinating care.  Cc:  Charlott Dorn LABOR, MD

## 2024-10-01 NOTE — Telephone Encounter (Signed)
 Went over meds with patients wife again and they are coming in tomorrow

## 2024-10-01 NOTE — Telephone Encounter (Signed)
 Pt's wife calling to discuss his Rx

## 2024-10-02 ENCOUNTER — Ambulatory Visit: Admitting: Neurology

## 2024-10-02 VITALS — BP 124/78 | HR 56 | Wt 191.0 lb

## 2024-10-02 DIAGNOSIS — G20B2 Parkinson's disease with dyskinesia, with fluctuations: Secondary | ICD-10-CM

## 2024-10-22 ENCOUNTER — Telehealth: Payer: Self-pay | Admitting: Neurology

## 2024-10-22 NOTE — Telephone Encounter (Signed)
 Wife would like update on being approved for Pump, is there anything else they can do to help it speed up he fell 6 times 10/21/24

## 2024-10-23 NOTE — Telephone Encounter (Signed)
 Pt's wife calling back for assistance of husband and falls, would like new order for walker or wheelchair, she would like it asap as he has fallen 7 times in one week

## 2024-10-27 ENCOUNTER — Telehealth: Payer: Self-pay | Admitting: Neurology

## 2024-10-27 NOTE — Telephone Encounter (Signed)
 Pt's wife calling back for assistance of husband and falls, would like new order for walker or wheelchair, she would like it asap as he has fallen 9 times in one week  And no update on Pump, we have only been contacted by the nurse [employer]

## 2024-10-27 NOTE — Telephone Encounter (Signed)
 Called Valeyv rep about this patient they are escalating the case . He is at [facility] for BV

## 2024-10-27 NOTE — Telephone Encounter (Signed)
 Have a separate encounter for this patient now. I will respond on that encounter

## 2024-10-28 ENCOUNTER — Other Ambulatory Visit: Payer: Self-pay

## 2024-10-28 DIAGNOSIS — G20B2 Parkinson's disease with dyskinesia, with fluctuations: Secondary | ICD-10-CM

## 2024-10-28 NOTE — Telephone Encounter (Signed)
 Called and spoke to patients wife I have been working to call Vaylev pump to esscalate the process to get patient in sooner. Patients wife needs another DME as [facility] said that the one she has is too old will mail

## 2024-10-28 NOTE — Telephone Encounter (Signed)
 PH: [phone]  Nancy(wife) called in to follow up on previous notes. She is requesting a referral for walker([employer]) and the one that they have is expired. She mentioned that she did purchase a wheelchair. She is requesting a call back.

## 2024-11-02 ENCOUNTER — Other Ambulatory Visit: Payer: Self-pay | Admitting: Neurology

## 2024-11-02 DIAGNOSIS — G20B1 Parkinson's disease with dyskinesia, without mention of fluctuations: Secondary | ICD-10-CM

## 2024-11-10 ENCOUNTER — Telehealth: Payer: Self-pay | Admitting: Neurology

## 2024-11-10 NOTE — Telephone Encounter (Signed)
 Vylev infusion PA needs more info as if it is to be claimed from Part D or Part B, pls call [employer] team back with info to move forward with PA

## 2024-11-11 NOTE — Telephone Encounter (Signed)
 Nancy Lvm for [facility] stating that she has not heard back regarding the pump, medication, and have not received referral yet for a step to walker. She stated that they had an appt on 10-02-24 and filled out paperwork, and also talked with Melinda(Nurse rep for company).  PH: [phone]

## 2024-11-11 NOTE — Telephone Encounter (Signed)
 Called [employer] and a PA had already been started.

## 2024-11-18 ENCOUNTER — Telehealth: Payer: Self-pay | Admitting: Neurology

## 2024-11-18 NOTE — Telephone Encounter (Signed)
 Nurse told them once they are scheduled and she will come out the day before

## 2024-11-18 NOTE — Telephone Encounter (Signed)
 They have also sent a mychart message as well

## 2024-11-18 NOTE — Telephone Encounter (Signed)
 Pt wife called in this morning,and the Pump and Medication for the  Parkinson Disease should arrive November 19, 2024. Thanks

## 2024-11-19 ENCOUNTER — Encounter: Payer: Self-pay | Admitting: Neurology

## 2024-11-25 ENCOUNTER — Encounter: Payer: Self-pay | Admitting: Neurology

## 2024-11-27 ENCOUNTER — Ambulatory Visit: Admitting: Neurology

## 2025-02-04 ENCOUNTER — Ambulatory Visit: Admitting: Neurology
